# Patient Record
Sex: Female | Born: 1940 | Race: Black or African American | Hispanic: No | Marital: Married | Smoking: Former smoker
Health system: Southern US, Community
[De-identification: ages and names within clinical notes are randomized; demographics above are authoritative.]

## PROBLEM LIST (undated history)

## (undated) DIAGNOSIS — I2510 Atherosclerotic heart disease of native coronary artery without angina pectoris: Secondary | ICD-10-CM

## (undated) DIAGNOSIS — N183 Chronic kidney disease, stage 3 unspecified: Secondary | ICD-10-CM

## (undated) DIAGNOSIS — D509 Iron deficiency anemia, unspecified: Secondary | ICD-10-CM

## (undated) DIAGNOSIS — E785 Hyperlipidemia, unspecified: Secondary | ICD-10-CM

## (undated) DIAGNOSIS — E669 Obesity, unspecified: Secondary | ICD-10-CM

## (undated) DIAGNOSIS — I4891 Unspecified atrial fibrillation: Secondary | ICD-10-CM

## (undated) DIAGNOSIS — I779 Disorder of arteries and arterioles, unspecified: Secondary | ICD-10-CM

## (undated) DIAGNOSIS — Z5189 Encounter for other specified aftercare: Secondary | ICD-10-CM

## (undated) DIAGNOSIS — I10 Essential (primary) hypertension: Secondary | ICD-10-CM

## (undated) DIAGNOSIS — E039 Hypothyroidism, unspecified: Secondary | ICD-10-CM

## (undated) DIAGNOSIS — I739 Peripheral vascular disease, unspecified: Secondary | ICD-10-CM

## (undated) HISTORY — DX: Peripheral vascular disease, unspecified: I73.9

## (undated) HISTORY — DX: Obesity, unspecified: E66.9

## (undated) HISTORY — DX: Chronic kidney disease, stage 3 (moderate): N18.3

## (undated) HISTORY — DX: Atherosclerotic heart disease of native coronary artery without angina pectoris: I25.10

## (undated) HISTORY — DX: Hyperlipidemia, unspecified: E78.5

## (undated) HISTORY — DX: Hypothyroidism, unspecified: E03.9

## (undated) HISTORY — DX: Essential (primary) hypertension: I10

## (undated) HISTORY — DX: Chronic kidney disease, stage 3 unspecified: N18.30

## (undated) HISTORY — DX: Disorder of arteries and arterioles, unspecified: I77.9

---

## 2004-04-01 HISTORY — PX: CORONARY ARTERY BYPASS GRAFT: SHX141

## 2004-04-08 ENCOUNTER — Encounter: Payer: Self-pay | Admitting: Cardiology

## 2004-04-11 ENCOUNTER — Encounter: Payer: Self-pay | Admitting: Cardiology

## 2004-04-11 ENCOUNTER — Inpatient Hospital Stay (HOSPITAL_COMMUNITY): Admission: AD | Admit: 2004-04-11 | Discharge: 2004-04-15 | Payer: Self-pay | Admitting: Cardiology

## 2004-05-12 ENCOUNTER — Ambulatory Visit: Payer: Self-pay | Admitting: Cardiology

## 2004-05-29 ENCOUNTER — Ambulatory Visit: Payer: Self-pay | Admitting: Cardiology

## 2004-07-10 ENCOUNTER — Ambulatory Visit: Payer: Self-pay | Admitting: Cardiology

## 2004-07-24 ENCOUNTER — Encounter
Admission: RE | Admit: 2004-07-24 | Discharge: 2004-07-24 | Payer: Self-pay | Admitting: Thoracic Surgery (Cardiothoracic Vascular Surgery)

## 2005-07-09 ENCOUNTER — Ambulatory Visit: Payer: Self-pay | Admitting: Cardiology

## 2006-08-09 ENCOUNTER — Ambulatory Visit: Payer: Self-pay | Admitting: Cardiology

## 2006-09-26 ENCOUNTER — Ambulatory Visit: Payer: Self-pay | Admitting: Cardiology

## 2006-12-02 ENCOUNTER — Ambulatory Visit: Payer: Self-pay | Admitting: Cardiology

## 2007-07-03 ENCOUNTER — Encounter: Payer: Self-pay | Admitting: Family Medicine

## 2007-07-23 ENCOUNTER — Encounter: Payer: Self-pay | Admitting: Family Medicine

## 2007-09-25 ENCOUNTER — Ambulatory Visit: Payer: Self-pay | Admitting: Family Medicine

## 2007-10-07 ENCOUNTER — Ambulatory Visit: Payer: Self-pay | Admitting: Family Medicine

## 2007-10-07 DIAGNOSIS — E785 Hyperlipidemia, unspecified: Secondary | ICD-10-CM | POA: Insufficient documentation

## 2007-10-07 DIAGNOSIS — I10 Essential (primary) hypertension: Secondary | ICD-10-CM | POA: Insufficient documentation

## 2007-10-07 DIAGNOSIS — E669 Obesity, unspecified: Secondary | ICD-10-CM | POA: Insufficient documentation

## 2007-11-06 ENCOUNTER — Ambulatory Visit: Payer: Self-pay | Admitting: Family Medicine

## 2007-11-06 LAB — CONVERTED CEMR LAB
Free T4: 1.33 ng/dL (ref 0.89–1.80)
T3, Free: 2.5 pg/mL (ref 2.3–4.2)
TSH: 9.99 microintl units/mL — ABNORMAL HIGH (ref 0.350–5.50)

## 2007-11-11 ENCOUNTER — Ambulatory Visit (HOSPITAL_COMMUNITY): Admission: RE | Admit: 2007-11-11 | Discharge: 2007-11-11 | Payer: Self-pay | Admitting: Family Medicine

## 2007-12-11 ENCOUNTER — Ambulatory Visit: Payer: Self-pay | Admitting: Family Medicine

## 2007-12-12 ENCOUNTER — Encounter: Payer: Self-pay | Admitting: Family Medicine

## 2007-12-25 ENCOUNTER — Encounter (HOSPITAL_COMMUNITY): Admission: RE | Admit: 2007-12-25 | Discharge: 2008-01-24 | Payer: Self-pay | Admitting: Endocrinology

## 2008-01-15 ENCOUNTER — Ambulatory Visit: Payer: Self-pay | Admitting: Cardiology

## 2008-01-15 ENCOUNTER — Encounter: Payer: Self-pay | Admitting: Physician Assistant

## 2008-01-21 ENCOUNTER — Ambulatory Visit: Payer: Self-pay | Admitting: Cardiology

## 2008-01-21 ENCOUNTER — Encounter: Payer: Self-pay | Admitting: Physician Assistant

## 2008-01-29 ENCOUNTER — Encounter: Payer: Self-pay | Admitting: Family Medicine

## 2008-01-29 ENCOUNTER — Telehealth: Payer: Self-pay | Admitting: Family Medicine

## 2008-01-29 ENCOUNTER — Ambulatory Visit: Payer: Self-pay | Admitting: Family Medicine

## 2008-01-29 LAB — CONVERTED CEMR LAB
Glucose, Bld: 82 mg/dL
Hgb A1c MFr Bld: 6.5 %

## 2008-02-05 ENCOUNTER — Ambulatory Visit: Payer: Self-pay | Admitting: Cardiology

## 2008-04-01 ENCOUNTER — Ambulatory Visit: Payer: Self-pay | Admitting: Family Medicine

## 2008-05-18 ENCOUNTER — Encounter: Payer: Self-pay | Admitting: Family Medicine

## 2008-05-18 ENCOUNTER — Telehealth: Payer: Self-pay | Admitting: Family Medicine

## 2008-06-02 ENCOUNTER — Ambulatory Visit: Payer: Self-pay | Admitting: Family Medicine

## 2008-06-28 ENCOUNTER — Encounter: Payer: Self-pay | Admitting: Family Medicine

## 2008-07-02 HISTORY — PX: EYE SURGERY: SHX253

## 2008-07-19 ENCOUNTER — Encounter: Payer: Self-pay | Admitting: Family Medicine

## 2008-07-26 ENCOUNTER — Encounter: Payer: Self-pay | Admitting: Family Medicine

## 2008-07-27 ENCOUNTER — Encounter: Payer: Self-pay | Admitting: Family Medicine

## 2008-08-10 ENCOUNTER — Ambulatory Visit: Payer: Self-pay | Admitting: Family Medicine

## 2008-08-12 DIAGNOSIS — J309 Allergic rhinitis, unspecified: Secondary | ICD-10-CM | POA: Insufficient documentation

## 2008-10-06 ENCOUNTER — Telehealth: Payer: Self-pay | Admitting: Family Medicine

## 2008-10-22 ENCOUNTER — Ambulatory Visit (HOSPITAL_COMMUNITY): Admission: RE | Admit: 2008-10-22 | Discharge: 2008-10-22 | Payer: Self-pay | Admitting: Family Medicine

## 2008-11-02 ENCOUNTER — Telehealth: Payer: Self-pay | Admitting: Family Medicine

## 2008-11-23 ENCOUNTER — Telehealth: Payer: Self-pay | Admitting: Family Medicine

## 2008-11-23 ENCOUNTER — Ambulatory Visit: Payer: Self-pay | Admitting: Family Medicine

## 2008-11-28 DIAGNOSIS — R7302 Impaired glucose tolerance (oral): Secondary | ICD-10-CM | POA: Insufficient documentation

## 2008-11-30 ENCOUNTER — Telehealth: Payer: Self-pay | Admitting: Family Medicine

## 2008-11-30 DIAGNOSIS — I779 Disorder of arteries and arterioles, unspecified: Secondary | ICD-10-CM | POA: Insufficient documentation

## 2008-11-30 DIAGNOSIS — I739 Peripheral vascular disease, unspecified: Secondary | ICD-10-CM

## 2008-12-08 ENCOUNTER — Encounter: Payer: Self-pay | Admitting: Cardiology

## 2009-01-04 ENCOUNTER — Ambulatory Visit: Payer: Self-pay | Admitting: Family Medicine

## 2009-01-17 ENCOUNTER — Telehealth: Payer: Self-pay | Admitting: Family Medicine

## 2009-01-17 DIAGNOSIS — R5381 Other malaise: Secondary | ICD-10-CM | POA: Insufficient documentation

## 2009-01-17 DIAGNOSIS — R5383 Other fatigue: Secondary | ICD-10-CM

## 2009-01-18 ENCOUNTER — Encounter: Payer: Self-pay | Admitting: Family Medicine

## 2009-01-20 LAB — CONVERTED CEMR LAB
ALT: 18 units/L (ref 0–35)
AST: 20 units/L (ref 0–37)
Albumin: 4.4 g/dL (ref 3.5–5.2)
Alkaline Phosphatase: 59 units/L (ref 39–117)
BUN: 21 mg/dL (ref 6–23)
Bilirubin, Direct: 0.1 mg/dL (ref 0.0–0.3)
CO2: 22 meq/L (ref 19–32)
Calcium: 9.7 mg/dL (ref 8.4–10.5)
Chloride: 106 meq/L (ref 96–112)
Cholesterol: 133 mg/dL (ref 0–200)
Creatinine, Ser: 1.18 mg/dL (ref 0.40–1.20)
Glucose, Bld: 95 mg/dL (ref 70–99)
HDL: 43 mg/dL (ref >39)
Indirect Bilirubin: 0.3 mg/dL (ref 0.0–0.9)
LDL Cholesterol: 62 mg/dL (ref 0–99)
Potassium: 4.2 meq/L (ref 3.5–5.3)
Sodium: 141 meq/L (ref 135–145)
TSH: 5.064 microintl units/mL — ABNORMAL HIGH (ref 0.350–4.500)
Total Bilirubin: 0.4 mg/dL (ref 0.3–1.2)
Total CHOL/HDL Ratio: 3.1
Total Protein: 8 g/dL (ref 6.0–8.3)
Triglycerides: 139 mg/dL (ref <150)
VLDL: 28 mg/dL (ref 0–40)

## 2009-01-31 ENCOUNTER — Telehealth: Payer: Self-pay | Admitting: Family Medicine

## 2009-01-31 DIAGNOSIS — N259 Disorder resulting from impaired renal tubular function, unspecified: Secondary | ICD-10-CM | POA: Insufficient documentation

## 2009-01-31 DIAGNOSIS — E039 Hypothyroidism, unspecified: Secondary | ICD-10-CM | POA: Insufficient documentation

## 2009-01-31 DIAGNOSIS — Z951 Presence of aortocoronary bypass graft: Secondary | ICD-10-CM | POA: Insufficient documentation

## 2009-02-02 ENCOUNTER — Encounter: Payer: Self-pay | Admitting: Cardiology

## 2009-02-02 ENCOUNTER — Ambulatory Visit: Payer: Self-pay | Admitting: Cardiology

## 2009-02-03 ENCOUNTER — Telehealth: Payer: Self-pay | Admitting: Family Medicine

## 2009-03-02 ENCOUNTER — Ambulatory Visit: Payer: Self-pay | Admitting: Family Medicine

## 2009-03-02 LAB — CONVERTED CEMR LAB
Glucose, Bld: 102 mg/dL
Hgb A1c MFr Bld: 6.3 %

## 2009-03-03 ENCOUNTER — Encounter: Payer: Self-pay | Admitting: Cardiology

## 2009-03-03 ENCOUNTER — Encounter: Payer: Self-pay | Admitting: Family Medicine

## 2009-03-03 LAB — CONVERTED CEMR LAB
Creatinine, Urine: 101.5 mg/dL
Microalb Creat Ratio: 6.2 mg/g (ref 0.0–30.0)
Microalb, Ur: 0.63 mg/dL (ref 0.00–1.89)

## 2009-03-04 ENCOUNTER — Telehealth (INDEPENDENT_AMBULATORY_CARE_PROVIDER_SITE_OTHER): Payer: Self-pay | Admitting: *Deleted

## 2009-03-14 ENCOUNTER — Telehealth: Payer: Self-pay | Admitting: Family Medicine

## 2009-03-22 ENCOUNTER — Telehealth: Payer: Self-pay | Admitting: Family Medicine

## 2009-06-01 ENCOUNTER — Ambulatory Visit: Payer: Self-pay | Admitting: Family Medicine

## 2009-06-01 LAB — CONVERTED CEMR LAB
Glucose, Bld: 101 mg/dL
Hgb A1c MFr Bld: 6.2 %

## 2009-06-09 ENCOUNTER — Telehealth: Payer: Self-pay | Admitting: Family Medicine

## 2009-06-28 ENCOUNTER — Telehealth: Payer: Self-pay | Admitting: Family Medicine

## 2009-08-09 LAB — CONVERTED CEMR LAB
ALT: 17 units/L (ref 0–35)
AST: 21 units/L (ref 0–37)
Albumin: 4.2 g/dL (ref 3.5–5.2)
Alkaline Phosphatase: 64 units/L (ref 39–117)
BUN: 31 mg/dL — ABNORMAL HIGH (ref 6–23)
Basophils Absolute: 0 10*3/uL (ref 0.0–0.1)
Basophils Relative: 0 % (ref 0–1)
Bilirubin, Direct: 0.1 mg/dL (ref 0.0–0.3)
CO2: 25 meq/L (ref 19–32)
Calcium: 10.6 mg/dL — ABNORMAL HIGH (ref 8.4–10.5)
Chloride: 103 meq/L (ref 96–112)
Cholesterol: 130 mg/dL (ref 0–200)
Creatinine, Ser: 1.2 mg/dL (ref 0.40–1.20)
Eosinophils Absolute: 0.5 10*3/uL (ref 0.0–0.7)
Eosinophils Relative: 6 % — ABNORMAL HIGH (ref 0–5)
Glucose, Bld: 103 mg/dL — ABNORMAL HIGH (ref 70–99)
HCT: 35.9 % — ABNORMAL LOW (ref 36.0–46.0)
HDL: 45 mg/dL (ref >39)
Hemoglobin: 11.9 g/dL — ABNORMAL LOW (ref 12.0–15.0)
Indirect Bilirubin: 0.3 mg/dL (ref 0.0–0.9)
LDL Cholesterol: 55 mg/dL (ref 0–99)
Lymphocytes Relative: 43 % (ref 12–46)
Lymphs Abs: 3.3 10*3/uL (ref 0.7–4.0)
MCHC: 33.1 g/dL (ref 30.0–36.0)
MCV: 89.1 fL (ref 78.0–100.0)
Monocytes Absolute: 0.5 10*3/uL (ref 0.1–1.0)
Monocytes Relative: 7 % (ref 3–12)
Neutro Abs: 3.3 10*3/uL (ref 1.7–7.7)
Neutrophils Relative %: 43 % (ref 43–77)
Platelets: 194 10*3/uL (ref 150–400)
Potassium: 4.7 meq/L (ref 3.5–5.3)
RBC: 4.03 M/uL (ref 3.87–5.11)
RDW: 13.9 % (ref 11.5–15.5)
Sodium: 141 meq/L (ref 135–145)
TSH: 1.891 microintl units/mL (ref 0.350–4.500)
Total Bilirubin: 0.4 mg/dL (ref 0.3–1.2)
Total CHOL/HDL Ratio: 2.9
Total Protein: 7.9 g/dL (ref 6.0–8.3)
Triglycerides: 150 mg/dL — ABNORMAL HIGH (ref <150)
VLDL: 30 mg/dL (ref 0–40)
Vit D, 25-Hydroxy: 50 ng/mL (ref 30–89)
WBC: 7.6 10*3/uL (ref 4.0–10.5)

## 2009-09-21 ENCOUNTER — Ambulatory Visit: Payer: Self-pay | Admitting: Family Medicine

## 2009-09-21 ENCOUNTER — Other Ambulatory Visit: Admission: RE | Admit: 2009-09-21 | Discharge: 2009-09-21 | Payer: Self-pay | Admitting: Family Medicine

## 2009-09-21 LAB — CONVERTED CEMR LAB: Pap Smear: NEGATIVE

## 2009-10-24 ENCOUNTER — Ambulatory Visit (HOSPITAL_COMMUNITY): Admission: RE | Admit: 2009-10-24 | Discharge: 2009-10-24 | Payer: Self-pay | Admitting: Family Medicine

## 2009-11-08 ENCOUNTER — Encounter: Payer: Self-pay | Admitting: Family Medicine

## 2009-11-23 LAB — CONVERTED CEMR LAB
BUN: 27 mg/dL — ABNORMAL HIGH (ref 6–23)
CO2: 25 meq/L (ref 19–32)
Calcium: 10.5 mg/dL (ref 8.4–10.5)
Chloride: 106 meq/L (ref 96–112)
Creatinine, Ser: 1.13 mg/dL (ref 0.40–1.20)
Glucose, Bld: 94 mg/dL (ref 70–99)
Hgb A1c MFr Bld: 5.9 % — ABNORMAL HIGH (ref <5.7)
Potassium: 5.5 meq/L — ABNORMAL HIGH (ref 3.5–5.3)
Sodium: 142 meq/L (ref 135–145)

## 2009-11-30 ENCOUNTER — Telehealth: Payer: Self-pay | Admitting: Family Medicine

## 2009-12-27 ENCOUNTER — Ambulatory Visit: Payer: Self-pay | Admitting: Family Medicine

## 2010-03-21 ENCOUNTER — Encounter: Payer: Self-pay | Admitting: Cardiology

## 2010-03-22 ENCOUNTER — Ambulatory Visit: Payer: Self-pay | Admitting: Cardiology

## 2010-03-31 ENCOUNTER — Encounter: Payer: Self-pay | Admitting: Cardiology

## 2010-03-31 ENCOUNTER — Telehealth: Payer: Self-pay | Admitting: Family Medicine

## 2010-03-31 ENCOUNTER — Ambulatory Visit (HOSPITAL_COMMUNITY): Admission: RE | Admit: 2010-03-31 | Discharge: 2010-03-31 | Payer: Self-pay | Admitting: Cardiology

## 2010-03-31 LAB — CONVERTED CEMR LAB
ALT: 18 units/L (ref 0–35)
AST: 21 units/L (ref 0–37)
Albumin: 4.4 g/dL (ref 3.5–5.2)
Alkaline Phosphatase: 56 units/L (ref 39–117)
BUN: 23 mg/dL (ref 6–23)
Bilirubin, Direct: 0.1 mg/dL (ref 0.0–0.3)
CO2: 26 meq/L (ref 19–32)
Calcium: 9.6 mg/dL (ref 8.4–10.5)
Chloride: 106 meq/L (ref 96–112)
Cholesterol: 141 mg/dL (ref 0–200)
Creatinine, Ser: 1.11 mg/dL (ref 0.40–1.20)
Creatinine, Urine: 75 mg/dL
Glucose, Bld: 95 mg/dL (ref 70–99)
HDL: 42 mg/dL (ref >39)
Hgb A1c MFr Bld: 6.2 % — ABNORMAL HIGH (ref <5.7)
Indirect Bilirubin: 0.4 mg/dL (ref 0.0–0.9)
LDL Cholesterol: 72 mg/dL (ref 0–99)
Microalb Creat Ratio: 48.7 mg/g — ABNORMAL HIGH (ref 0.0–30.0)
Microalb, Ur: 3.65 mg/dL — ABNORMAL HIGH (ref 0.00–1.89)
Potassium: 5.3 meq/L (ref 3.5–5.3)
Sodium: 143 meq/L (ref 135–145)
TSH: 1.719 microintl units/mL (ref 0.350–4.500)
Total Bilirubin: 0.5 mg/dL (ref 0.3–1.2)
Total CHOL/HDL Ratio: 3.4
Total Protein: 7.7 g/dL (ref 6.0–8.3)
Triglycerides: 137 mg/dL (ref <150)
VLDL: 27 mg/dL (ref 0–40)

## 2010-04-07 ENCOUNTER — Telehealth: Payer: Self-pay | Admitting: Family Medicine

## 2010-04-27 ENCOUNTER — Ambulatory Visit: Payer: Self-pay | Admitting: Family Medicine

## 2010-04-27 LAB — HM DIABETES FOOT EXAM

## 2010-05-11 ENCOUNTER — Telehealth: Payer: Self-pay | Admitting: Family Medicine

## 2010-07-11 LAB — CONVERTED CEMR LAB
ALT: 21 units/L (ref 0–35)
AST: 21 units/L (ref 0–37)
Albumin: 4.6 g/dL (ref 3.5–5.2)
Alkaline Phosphatase: 62 units/L (ref 39–117)
BUN: 26 mg/dL — ABNORMAL HIGH (ref 6–23)
Bilirubin, Direct: 0.2 mg/dL (ref 0.0–0.3)
CO2: 27 meq/L (ref 19–32)
Calcium: 9.7 mg/dL (ref 8.4–10.5)
Chloride: 106 meq/L (ref 96–112)
Cholesterol: 156 mg/dL (ref 0–200)
Creatinine, Ser: 1.16 mg/dL (ref 0.40–1.20)
Glucose, Bld: 103 mg/dL — ABNORMAL HIGH (ref 70–99)
HDL: 46 mg/dL (ref >39)
Hgb A1c MFr Bld: 6.1 % — ABNORMAL HIGH (ref <5.7)
Indirect Bilirubin: 0.3 mg/dL (ref 0.0–0.9)
LDL Cholesterol: 84 mg/dL (ref 0–99)
Potassium: 4.2 meq/L (ref 3.5–5.3)
Sodium: 142 meq/L (ref 135–145)
Total Bilirubin: 0.5 mg/dL (ref 0.3–1.2)
Total CHOL/HDL Ratio: 3.4
Total Protein: 8.1 g/dL (ref 6.0–8.3)
Triglycerides: 132 mg/dL (ref <150)
VLDL: 26 mg/dL (ref 0–40)

## 2010-07-30 LAB — CONVERTED CEMR LAB
ALT: 23 units/L (ref 0–35)
AST: 23 units/L (ref 0–37)
Albumin: 4.5 g/dL (ref 3.5–5.2)
Alkaline Phosphatase: 70 units/L (ref 39–117)
BUN: 25 mg/dL — ABNORMAL HIGH (ref 6–23)
Bilirubin, Direct: 0.1 mg/dL (ref 0.0–0.3)
CO2: 23 meq/L (ref 19–32)
Calcium: 9.9 mg/dL (ref 8.4–10.5)
Chloride: 105 meq/L (ref 96–112)
Cholesterol: 146 mg/dL (ref 0–200)
Creatinine, Ser: 1.16 mg/dL (ref 0.40–1.20)
Glucose, Bld: 111 mg/dL — ABNORMAL HIGH (ref 70–99)
HDL: 48 mg/dL (ref >39)
Indirect Bilirubin: 0.4 mg/dL (ref 0.0–0.9)
LDL Cholesterol: 61 mg/dL (ref 0–99)
Potassium: 4.8 meq/L (ref 3.5–5.3)
Sodium: 143 meq/L (ref 135–145)
Total Bilirubin: 0.5 mg/dL (ref 0.3–1.2)
Total CHOL/HDL Ratio: 3
Total Protein: 8.4 g/dL — ABNORMAL HIGH (ref 6.0–8.3)
Triglycerides: 185 mg/dL — ABNORMAL HIGH (ref <150)
VLDL: 37 mg/dL (ref 0–40)

## 2010-08-01 NOTE — Progress Notes (Signed)
Summary: refill  Phone Note Call from Patient   Reason for Call: Insurance Question Summary of Call: needs a refill on clonidine [facility] [phone] Initial call taken by: Lenn Cal,  March 31, 2010 2:20 PM  Follow-up for Phone Call        The last time this med was filled was 2010. Is the patient supposed to be taking this med? Follow-up by: Kate Sable LPN,  September 30, 624THL 3:37 PM  Additional Follow-up for Phone Call Additional follow up Details #1::        contact pt and let her know of the concern, states she takes it occasionally, we need to know what she isactually doing Additional Follow-up by: Tula Nakayama MD,  April 03, 2010 12:10 PM    Additional Follow-up for Phone Call Additional follow up Details #2::    returned call, left message Follow-up by: Baldomero Lamy LPN,  October  3, 624THL 1:25 PM  Additional Follow-up for Phone Call Additional follow up Details #3:: Details for Additional Follow-up Action Taken: patient states she was taking one half but directions are one and half Additional Follow-up by: Baldomero Lamy LPN,  October  3, 624THL 2:18 PM  pls refill at the same dose one and a half. let her know that her sept visither bP was high at the card office, so she needs to take as directed and sched appt here in the next 4 weeks if she has no appt, pls refill 2 months max , thanks  patient aware Baldomero Lamy LPN  October  3, 624THL 2:35 PM

## 2010-08-01 NOTE — Progress Notes (Signed)
Summary: call back  Phone Note Call from Patient   Summary of Call: left message would like to speak to jamie Initial call taken by: Dierdre Harness,  April 07, 2010 10:26 AM  Follow-up for Phone Call        see previous msg Follow-up by: Kate Sable LPN,  October  7, 624THL 11:21 AM

## 2010-08-01 NOTE — Cardiovascular Report (Signed)
Summary: Cardiac Catheterization  Cardiac Catheterization   Imported By: Bartholomew Boards 03/21/2010 16:46:14  _____________________________________________________________________  External Attachment:    Type:   Image     Comment:   External Document

## 2010-08-01 NOTE — Letter (Signed)
Summary: misc.  misc.   Imported By: Eliezer Mccoy 12/19/2009 15:46:27  _____________________________________________________________________  External Attachment:    Type:   Image     Comment:   External Document

## 2010-08-01 NOTE — Assessment & Plan Note (Signed)
Summary: physical- room 3   Vital Signs:  Patient profile:   70 year old female Menstrual status:  postmenopausal Height:      61 inches Weight:      207.75 pounds BMI:     39.40 O2 Sat:      97 % on Room air Pulse rate:   45 / minute Resp:     16 per minute BP sitting:   160 / 70  (left arm)  Vitals Entered By: Baldomero Lamy LPN (March 23, 624THL 624THL AM)  Nutrition Counseling: Patient's BMI is greater than 25 and therefore counseled on weight management options.  Serial Vital Signs/Assessments:  Time      Position  BP       Pulse  Resp  Temp     By                     162/84                         Kennith Gain PA  CC: physical- room 3 Is Patient Diabetic? Yes Pain Assessment Patient in pain? no        Primary Provider:  Tula Nakayama, MD  CC:  physical- room 3.  History of Present Illness: Pt is here today for a physical. Her last pap/pelvic exam was 2 yrs ago. No complaints except bra strap sometimes hurts Lt shoulder.  Notices more when her bras seem to be getting older & less supportive.  See' cardiologist once a yr.  CAD & hx of CABG No chest pain, shortness of breath, or periph edema.  Hx of DM. Blood sugars at home fasting 90-110, evenings 120's avg. No hypoglycemia. Exam UTD.  Blood pressure at home last night 132/55     Current Medications (verified): 1)  Crestor 10 Mg  Tabs (Rosuvastatin Calcium) .... One Tab By Mouth Once Daily 2)  Allegra 180 Mg  Tabs (Fexofenadine Hcl) .... One Tab By Mouth Once Daily 3)  Fish Oil Double Strength 1200 Mg Caps (Omega-3 Fatty Acids) .... Take 1 Tablet By Mouth Two Times A Day 4)  Diovan Hct 320-25 Mg  Tabs (Valsartan-Hydrochlorothiazide) .... One Tab By Mouth Once Daily 5)  Aspirin 81 Mg  Tbec (Aspirin) .... One Tab By Mouth Once Daily 6)  Calcium Carbonate 600 Mg Tabs (Calcium Carbonate) .... Take 1 Tablet By Mouth Two Times A Day 7)  Multivitamin/iron   Tabs (Multiple Vitamins-Iron) .... One Tab By Mouth  Once Daily 8)  Clonidine Hcl 0.3 Mg  Tabs (Clonidine Hcl) .... One and Half Tabs By Mouth At Bedtime Occasionally 9)  Metformin Hcl 500 Mg Tabs (Metformin Hcl) .... Take 1 Tablet By Mouth Once A Day 10)  Xalatan 0.005 % Soln (Latanoprost) .... One Drop Each Eye At Bedtime 11)  Toprol Xl 100 Mg Xr24h-Tab (Metoprolol Succinate) .... Take 1 Tablet By Mouth Once A Day 12)  Norvasc 5 Mg Tabs (Amlodipine Besylate) .... Take 1 Tablet By Mouth Once A Day 13)  Dorzolamide-Timolol 2-0.5 % Soln (Dorzolamide-Timolol) .... One Drop in Each Eye Bid 14)  Vitamin D3 1000 Unit Caps (Cholecalciferol) .... Take 1 Tablet By Mouth Once A Day 15)  Oscal 500/200 D-3 500-200 Mg-Unit Tabs (Calcium-Vitamin D) .... Take 1 Tablet By Mouth Three Times A Day 16)  Synthroid 25 Mcg Tabs (Levothyroxine Sodium) .... One and A Half Tablets Daily 17)  Armour Thyroid 30 Mg Tabs (Thyroid) .... Two Tabs  By Mouth Mon-Fri One Tab By Mouth Sat and Sun  Allergies (verified): No Known Drug Allergies  Past History:  Past medical, surgical, family and social histories (including risk factors) reviewed for relevance to current acute and chronic problems.  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 Coronary artery bypass graft x4 (10/05)  Family History: Reviewed history from 10/07/2007 and no changes required. MOTHER DECEASED ANEURYSM/ SITE  UNKNOWN / HYPERTENSIVE / CORONARY ARTERY  DISEASE FATHER DECEASED OF CAD FOUR SISTERS LIVING HYPERTENSION AND DIABETES/ ONE HAS CORONARY ARTERY DISEASE ONE SON /  DIABETES  Social History: Reviewed history from 10/07/2007 and no changes required. Retired Married Former Smoker Alcohol  use-no Drug use-no  Review of Systems Eyes:  Denies blurring and double vision. ENT:  Denies earache, nasal congestion, and sore throat. CV:  Denies chest pain or discomfort and palpitations. Resp:  Denies cough and shortness of breath. GI:  Complains of indigestion; denies abdominal pain, bloody stools, change in bowel habits, constipation, dark tarry stools, nausea, and vomiting; indigestion with certain foods only. GU:  Denies abnormal vaginal bleeding, dysuria, incontinence, and urinary frequency. Psych:  Denies anxiety and depression. Allergy:  Complains of seasonal allergies and sneezing.  Physical Exam  General:  Well-developed,well-nourished,in no acute distress; alert,appropriate and cooperative throughout examination Head:  Normocephalic and atraumatic without obvious abnormalities. No apparent alopecia or balding. Ears:  External ear exam shows no significant lesions or deformities.  Otoscopic examination reveals clear canals, tympanic membranes are intact bilaterally without bulging, retraction, inflammation or discharge. Hearing is grossly normal bilaterally. Nose:  External nasal examination shows no deformity or inflammation. Nasal mucosa are pink and moist without lesions or exudates. Mouth:  Oral mucosa and oropharynx without lesions or exudates.  Teeth in good repair. Neck:  No deformities, masses, or tenderness noted.no thyromegaly and no carotid bruits.   Chest Wall:  sternal scar,no deformities and no mass.   Breasts:  No mass, nodules, thickening, tenderness, bulging, retraction, inflamation, nipple discharge or skin changes noted.   Lungs:  Normal respiratory effort, chest expands symmetrically. Lungs are clear to auscultation, no crackles or wheezes. Heart:  Normal rate and regular rhythm. S1 and S2 normal without gallop, murmur, click, rub or other extra sounds. Abdomen:  Bowel sounds positive,abdomen soft and non-tender without masses, organomegaly or hernias  noted. Rectal:  No external abnormalities noted. Normal sphincter tone. No rectal masses or tenderness. Genitalia:  Normal introitus for age, no external lesions, no vaginal discharge, mucosa pink and moist, no vaginal or cervical lesions, no vaginal atrophy, no friaility or hemorrhage, normal uterus size and position, no adnexal masses or tenderness Skin:  Intact without suspicious lesions or rashes Cervical Nodes:  No lymphadenopathy noted Axillary Nodes:  No palpable lymphadenopathy Psych:  Cognition and judgment appear intact. Alert and cooperative with normal attention span and concentration. No apparent delusions, illusions, hallucinations   Impression & Recommendations:  Problem # 1:  Preventive Health Care (ICD-V70.0)  Problem # 2:  DIABETES MELLITUS, TYPE II (ICD-250.00) Assessment: Unchanged  Her updated medication list for this problem includes:    Diovan Hct 320-25 Mg Tabs (Valsartan-hydrochlorothiazide) ..... One tab by mouth once daily    Aspirin 81 Mg Tbec (Aspirin) ..... One tab by mouth once daily    Metformin Hcl 500  Mg Tabs (Metformin hcl) .Marland Kitchen... Take 1 tablet by mouth once a day  Labs Reviewed: Creat: 1.20 (08/09/2009)    Reviewed HgBA1c results: 6.2 (06/01/2009)  6.3 (03/02/2009)  Orders: T-Basic Metabolic Panel ([id number]) T- Hemoglobin A1C [id number])  Problem # 3:  HYPERTENSION (ICD-401.9) Assessment: Deteriorated Pt states ABP are much lower at home. Will have her bring her home BP monitor with her to check accuracy.  Her updated medication list for this problem includes:    Diovan Hct 320-25 Mg Tabs (Valsartan-hydrochlorothiazide) ..... One tab by mouth once daily    Clonidine Hcl 0.3 Mg Tabs (Clonidine hcl) ..... One and half tabs by mouth at bedtime occasionally    Toprol Xl 100 Mg Xr24h-tab (Metoprolol succinate) .Marland Kitchen... Take 1 tablet by mouth once a day    Norvasc 5 Mg Tabs (Amlodipine besylate) .Marland Kitchen... Take 1 tablet by mouth once a day  BP today:  160/70 Prior BP: 140/70 (06/01/2009)  Labs Reviewed: K+: 4.7 (08/09/2009) Creat: : 1.20 (08/09/2009)   Chol: 130 (08/09/2009)   HDL: 45 (08/09/2009)   LDL: 55 (08/09/2009)   TG: 150 (08/09/2009)  Problem # 4:  HYPERLIPIDEMIA (ICD-272.4) Assessment: Improved  Her updated medication list for this problem includes:    Crestor 10 Mg Tabs (Rosuvastatin calcium) ..... One tab by mouth once daily  Labs Reviewed: SGOT: 21 (08/09/2009)   SGPT: 17 (08/09/2009)   HDL:45 (08/09/2009), 43 (01/18/2009)  LDL:55 (08/09/2009), 62 (01/18/2009)  Chol:130 (08/09/2009), 133 (01/18/2009)  Trig:150 (08/09/2009), 139 (01/18/2009)  Complete Medication List: 1)  Crestor 10 Mg Tabs (Rosuvastatin calcium) .... One tab by mouth once daily 2)  Allegra 180 Mg Tabs (Fexofenadine hcl) .... One tab by mouth once daily 3)  Fish Oil Double Strength 1200 Mg Caps (Omega-3 fatty acids) .... Take 1 tablet by mouth two times a day 4)  Diovan Hct 320-25 Mg Tabs (Valsartan-hydrochlorothiazide) .... One tab by mouth once daily 5)  Aspirin 81 Mg Tbec (Aspirin) .... One tab by mouth once daily 6)  Calcium Carbonate 600 Mg Tabs (Calcium carbonate) .... Take 1 tablet by mouth two times a day 7)  Multivitamin/iron Tabs (Multiple vitamins-iron) .... One tab by mouth once daily 8)  Clonidine Hcl 0.3 Mg Tabs (Clonidine hcl) .... One and half tabs by mouth at bedtime occasionally 9)  Metformin Hcl 500 Mg Tabs (Metformin hcl) .... Take 1 tablet by mouth once a day 10)  Xalatan 0.005 % Soln (Latanoprost) .... One drop each eye at bedtime 11)  Toprol Xl 100 Mg Xr24h-tab (Metoprolol succinate) .... Take 1 tablet by mouth once a day 12)  Norvasc 5 Mg Tabs (Amlodipine besylate) .... Take 1 tablet by mouth once a day 13)  Dorzolamide-timolol 2-0.5 % Soln (Dorzolamide-timolol) .... One drop in each eye bid 14)  Vitamin D3 1000 Unit Caps (Cholecalciferol) .... Take 1 tablet by mouth once a day 15)  Oscal 500/200 D-3 500-200 Mg-unit Tabs  (Calcium-vitamin d) .... Take 1 tablet by mouth three times a day 16)  Synthroid 25 Mcg Tabs (Levothyroxine sodium) .... One and a half tablets daily 17)  Armour Thyroid 30 Mg Tabs (Thyroid) .... Two tabs by mouth mon-fri one tab by mouth sat and sun  Other Orders: Mammogram (Screening) (Mammo) Hemoccult Guaiac-1 spec.(in office) [id number])  Patient Instructions: 1)  Please schedule a follow-up appointment in 3 months. 2)  It is important that you exercise regularly at least 20 minutes 5 times a week. If you develop chest pain, have severe difficulty breathing, or feel  very tired , stop exercising immediately and seek medical attention. 3)  You need to lose weight. Consider a lower calorie diet and regular exercise.  4)  BMP prior to visit 5)  Continue to monitor blood pressure at home.  Bring your blood pressure machine to your next appt to check the accuracy.

## 2010-08-01 NOTE — Letter (Signed)
Summary: phone notes  phone notes   Imported By: Eliezer Mccoy 12/19/2009 15:46:44  _____________________________________________________________________  External Attachment:    Type:   Image     Comment:   External Document

## 2010-08-01 NOTE — Letter (Signed)
Summary: history and physical  history and physical   Imported By: Eliezer Mccoy 12/19/2009 15:45:31  _____________________________________________________________________  External Attachment:    Type:   Image     Comment:   External Document

## 2010-08-01 NOTE — Letter (Signed)
Summary: demographic  demographic   Imported By: Eliezer Mccoy 12/19/2009 15:45:11  _____________________________________________________________________  External Attachment:    Type:   Image     Comment:   External Document

## 2010-08-01 NOTE — Miscellaneous (Signed)
  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    nuclear  12/2007..  (no echo data as of 03/21/2010) HYPERLIPIDEMIA (ICD-272.4) HYPERTENSION (ICD-401.9) GLAUCOMA  dx 2010, had laser to both eyes in 2010 Hypothyroidism Renal insufficiency..mild Carotid artery disease   doppler 11/2008.Marland KitchenMarland KitchenMarland Kitchen50% RICA  /   doppler  03/2010.Marland KitchenMarland KitchenMarland Kitchen60-79% RICA... less than [id number] LICA...PLAN FOLLOW UP DOPPLER 6 MONTHS    (03/31/2010 12:42) Added new observation of PRIMARY MD: Tula Nakayama, MD (03/31/2010 12:42)       Past History:  Past Medical History: OBESITY (ICD-278.00) CAD (ICD-414.00)  /   nuclear 12/2007..small lateral fixed defect....EF 70% CABG..2005..x4 EF  70%.....    nuclear  12/2007..  (no echo data as of 03/21/2010) HYPERLIPIDEMIA (ICD-272.4) HYPERTENSION (ICD-401.9) GLAUCOMA  dx 2010, had laser to both eyes in 2010 Hypothyroidism Renal insufficiency..mild Carotid artery disease   doppler 11/2008.Marland KitchenMarland KitchenMarland Kitchen50% RICA  /   doppler  03/2010.Marland KitchenMarland KitchenMarland Kitchen60-79% RICA... less than [id number] LICA...PLAN FOLLOW UP DOPPLER 6 MONTHS

## 2010-08-01 NOTE — Assessment & Plan Note (Signed)
Summary: office visit   Vital Signs:  Patient profile:   70 year old female Menstrual status:  postmenopausal Height:      61 inches Weight:      198.50 pounds BMI:     37.64 O2 Sat:      98 % on Room air Pulse rate:   55 / minute Pulse rhythm:   regular Resp:     16 per minute BP sitting:   170 / 82  (left arm)  Vitals Entered By: Louie Casa CMA (April 27, 2010 11:38 AM)  Nutrition Counseling: Patient's BMI is greater than 25 and therefore counseled on weight management options.  O2 Flow:  Room air CC: follow up   Primary Care Provider:  Tula Nakayama, MD  CC:  follow up.  History of Present Illness: Reports  that she has been  doing well. Denies recent fever or chills. Denies sinus pressure, nasal congestion , ear pain or sore throat. Denies chest congestion, or cough productive of sputum. Denies chest pain, palpitations, PND, orthopnea or leg swelling. Denies abdominal pain, nausea, vomitting, diarrhea or constipation. Denies change in bowel movements or bloody stool. Denies dysuria , frequency, incontinence or hesitancy. Denies  joint pain, swelling, or reduced mobility. Denies headaches, vertigo, seizures. Denies depression, anxiety or insomnia. Denies  rash, lesions, or itch.     Current Medications (verified): 1)  Crestor 10 Mg  Tabs (Rosuvastatin Calcium) .... One Tab By Mouth Once Daily 2)  Allegra 180 Mg  Tabs (Fexofenadine Hcl) .... One Tab By Mouth Once Daily 3)  Fish Oil Double Strength 1200 Mg Caps (Omega-3 Fatty Acids) .... Take 1 Tablet By Mouth Two Times A Day 4)  Diovan Hct 320-25 Mg  Tabs (Valsartan-Hydrochlorothiazide) .... One Tab By Mouth Once Daily 5)  Aspirin 81 Mg  Tbec (Aspirin) .... One Tab By Mouth Once Daily 6)  Multivitamin/iron   Tabs (Multiple Vitamins-Iron) .... One Tab By Mouth Once Daily 7)  Metformin Hcl 500 Mg Tabs (Metformin Hcl) .... Take 1 Tablet By Mouth Once A Day 8)  Xalatan 0.005 % Soln (Latanoprost) .... One Drop  Each Eye At Bedtime 9)  Toprol Xl 100 Mg Xr24h-Tab (Metoprolol Succinate) .... Take 1 Tablet By Mouth Once A Day 10)  Norvasc 5 Mg Tabs (Amlodipine Besylate) .... Take 1 Tablet By Mouth Once A Day 11)  Dorzolamide-Timolol 2-0.5 % Soln (Dorzolamide-Timolol) .... One Drop in Each Eye Two Times A Day. 12)  Vitamin D3 1000 Unit Caps (Cholecalciferol) .... Take 1 Tablet By Mouth Once A Day 13)  Armour Thyroid 30 Mg Tabs (Thyroid) .... Two Tabs By Mouth Mon-Fri One Tab By Mouth Sat and Sun 14)  Theratrum Complete 50 Plus .Marland Kitchen.. 1 Tab Daily  Allergies (verified): 1)  ! * Clonidine  Review of Systems      See HPI General:  Complains of fatigue. Eyes:  Denies blurring, discharge, eye pain, and halos. Endo:  Denies cold intolerance, excessive thirst, excessive urination, and heat intolerance. Heme:  Denies abnormal bruising and bleeding. Allergy:  Denies hives or rash and itching eyes.  Physical Exam  General:  Well-developed,well-nourished,in no acute distress; alert,appropriate and cooperative throughout examination HEENT: No facial asymmetry,  EOMI, No sinus tenderness, TM's Clear, oropharynx  pink and moist.   Chest: Clear to auscultation bilaterally.  CVS: S1, S2, No murmurs, No S3.   Abd: Soft, Nontender.  MS: Adequate ROM spine, hips, shoulders and knees.  Ext: No edema.   CNS: CN 2-12 intact, power  tone and sensation normal throughout.   Skin: Intact, no visible lesions or rashes.  Psych: Good eye contact, normal affect.  Memory intact, not anxious or depressed appearing.   Diabetes Management Exam:    Foot Exam (with socks and/or shoes not present):       Sensory-Monofilament:          Left foot: diminished          Right foot: normal       Inspection:          Left foot: normal          Right foot: normal       Nails:          Left foot: thickened          Right foot: too long   Impression & Recommendations:  Problem # 1:  HYPOTHYROIDISM (ICD-244.9) Assessment  Unchanged  Her updated medication list for this problem includes:    Armour Thyroid 30 Mg Tabs (Thyroid) .Marland Kitchen..Marland Kitchen Two tabs by mouth mon-fri one tab by mouth sat and sun  Labs Reviewed: TSH: 1.719 (03/31/2010)    HgBA1c: 6.2 (03/31/2010) Chol: 141 (03/31/2010)   HDL: 42 (03/31/2010)   LDL: 72 (03/31/2010)   TG: 137 (03/31/2010)  Problem # 2:  DIABETES MELLITUS, TYPE II (ICD-250.00) Assessment: Unchanged  Her updated medication list for this problem includes:    Diovan Hct 320-25 Mg Tabs (Valsartan-hydrochlorothiazide) ..... One tab by mouth once daily    Aspirin 81 Mg Tbec (Aspirin) ..... One tab by mouth once daily    Metformin Hcl 500 Mg Tabs (Metformin hcl) .Marland Kitchen... Take 1 tablet by mouth once a day  Orders: T- Hemoglobin A1C [id number])  Labs Reviewed: Creat: 1.11 (03/31/2010)    Reviewed HgBA1c results: 6.2 (03/31/2010)  5.9 (11/23/2009)  Problem # 3:  HYPERLIPIDEMIA (ICD-272.4) Assessment: Unchanged  Her updated medication list for this problem includes:    Crestor 10 Mg Tabs (Rosuvastatin calcium) ..... One tab by mouth once daily  Orders: Medicare Electronic Prescription [phone]) T-Lipid Profile [phone]) T-Hepatic Function [id number]) Low fat dietdiscussed and encouraged  Labs Reviewed: SGOT: 21 (03/31/2010)   SGPT: 18 (03/31/2010)   HDL:42 (03/31/2010), 45 (08/09/2009)  LDL:72 (03/31/2010), 55 (08/09/2009)  Chol:141 (03/31/2010), 130 (08/09/2009)  Trig:137 (03/31/2010), 150 (08/09/2009)  Problem # 4:  OBESITY (ICD-278.00) Assessment: Improved  Ht: 61 (04/27/2010)   Wt: 198.50 (04/27/2010)   BMI: 37.64 (04/27/2010) therapeutic lifestyle change discussed and encouraged  Problem # 5:  HYPERTENSION (ICD-401.9) Assessment: Unchanged  The following medications were removed from the medication list:    Clonidine Hcl 0.3 Mg Tabs (Clonidine hcl) ..... One and half tabs by mouth at bedtime occasionally    Toprol Xl 100 Mg Xr24h-tab (Metoprolol succinate) .Marland Kitchen...  Take 1 tablet by mouth once a day Her updated medication list for this problem includes:    Diovan Hct 320-25 Mg Tabs (Valsartan-hydrochlorothiazide) ..... One tab by mouth once daily    Norvasc 5 Mg Tabs (Amlodipine besylate) .Marland Kitchen... Take 1 tablet by mouth once a day    Toprol Xl 100 Mg Xr24h-tab (Metoprolol succinate) ..... One and a half tablets once daily  Orders: Medicare Electronic Prescription ([id number]) T-Basic Metabolic Panel ([id number])  BP today: 170/82 Prior BP: 183/82 (03/22/2010) Patient advised to follow low sodium diet rich in fruit and vegetables, and to commit to at least 30 minutes 5 days per week of regular exercise , to improve blood presure control.   Labs Reviewed: K+: 5.3 (03/31/2010) Creat: :  1.11 (03/31/2010)   Chol: 141 (03/31/2010)   HDL: 42 (03/31/2010)   LDL: 72 (03/31/2010)   TG: 137 (03/31/2010)  Complete Medication List: 1)  Crestor 10 Mg Tabs (Rosuvastatin calcium) .... One tab by mouth once daily 2)  Allegra 180 Mg Tabs (Fexofenadine hcl) .... One tab by mouth once daily 3)  Fish Oil Double Strength 1200 Mg Caps (Omega-3 fatty acids) .... Take 1 tablet by mouth two times a day 4)  Diovan Hct 320-25 Mg Tabs (Valsartan-hydrochlorothiazide) .... One tab by mouth once daily 5)  Aspirin 81 Mg Tbec (Aspirin) .... One tab by mouth once daily 6)  Multivitamin/iron Tabs (Multiple vitamins-iron) .... One tab by mouth once daily 7)  Metformin Hcl 500 Mg Tabs (Metformin hcl) .... Take 1 tablet by mouth once a day 8)  Xalatan 0.005 % Soln (Latanoprost) .... One drop each eye at bedtime 9)  Norvasc 5 Mg Tabs (Amlodipine besylate) .... Take 1 tablet by mouth once a day 10)  Dorzolamide-timolol 2-0.5 % Soln (Dorzolamide-timolol) .... One drop in each eye two times a day. 11)  Vitamin D3 1000 Unit Caps (Cholecalciferol) .... Take 1 tablet by mouth once a day 12)  Armour Thyroid 30 Mg Tabs (Thyroid) .... Two tabs by mouth mon-fri one tab by mouth sat and sun 13)   Theratrum Complete 50 Plus  .Marland Kitchen.. 1 tab daily 14)  Toprol Xl 100 Mg Xr24h-tab (Metoprolol succinate) .... One and a half tablets once daily  Patient Instructions: 1)  Please schedule a follow-up appointment in 4 months. 2)  BMP prior to visit, ICD-9: 3)  Hepatic Panel prior to visit, ICD-9: 4)  Lipid Panel prior to visit, ICD-9: 5)  TSH prior to visit, ICD-9: 6)  HbgA1C prior to visit, ICD-9: 7)  New dose of toprol is oNE and a hALF tablets once daily Prescriptions: ARMOUR THYROID 30 MG TABS (THYROID) two tabs by mouth mon-fri one tab by mouth sat and sun  #48 Each x 3   Entered by:   Baldomero Lamy LPN   Authorized by:   Tula Nakayama MD   Signed by:   Baldomero Lamy LPN on 624THL   Method used:   Electronically to        [facility]* (retail)       [place], [place]  [address]       Ph: [phone] or [phone]       Fax: [phone]   RxID:   [id number] NORVASC 5 MG TABS (AMLODIPINE BESYLATE) Take 1 tablet by mouth once a day  #90 x 1   Entered by:   Baldomero Lamy LPN   Authorized by:   Tula Nakayama MD   Signed by:   Baldomero Lamy LPN on 624THL   Method used:   Electronically to        [facility]* (retail)       [address]       [place], [place]  [address]       Ph: [phone] or [phone]       Fax: [phone]   RxID:   [id number] TOPROL XL 100 MG XR24H-TAB (METOPROLOL SUCCINATE) one and a half tablets once daily  #135 x 1   Entered and Authorized by:   Tula Nakayama MD   Signed by:   Tula Nakayama  MD on 04/27/2010   Method used:   Printed then faxed to ...       [facility]* (retail)       [address]       [place], [place]  [address]       Ph: [phone] or [phone]       Fax: [phone]   RxID:   [id number]    Orders Added: 1)  Est. Patient Level IV [id number] 2)   Medicare Electronic Prescription [id number])  T-Basic Metabolic Panel 0000000 4)  T-Lipid Profile [80061-22930] 5)  T-Hepatic Function [80076-22960] 6)  T- Hemoglobin A1C [[id number]]

## 2010-08-01 NOTE — Progress Notes (Signed)
Summary: results  Phone Note Call from Patient   Summary of Call: would like to get results of lab work [phone] Initial call taken by: Lenn Cal,  November 30, 2009 1:16 PM  Follow-up for Phone Call        patient aware labs are normal Follow-up by: Baldomero Lamy LPN,  June  1, 624THL [id number] PM

## 2010-08-01 NOTE — Progress Notes (Signed)
Summary: medicine  Phone Note Call from Patient   Summary of Call: medicine doc gave her is to strong. can't take it. please call her. [phone] Initial call taken by: Lenn Cal,  April 07, 2010 10:28 AM  Follow-up for Phone Call        cannot take the clonidine, took strong for her. Made her voice hoarse and made her stomach tight, constipated. After she stopped it, she felt better. Wants you to know she is not going to take it and I put it on her allergy list. States bp yesterday when Wille Glaser checked it was 114/42. Told her to come to the office for app but she didn't want to. Said she would keep an eye on it and call us back with results Follow-up by: Kate Sable LPN,  October  7, 624THL 11:19 AM  Additional Follow-up for Phone Call Additional follow up Details #1::        noted Additional Follow-up by: Tula Nakayama MD,  April 07, 2010 12:18 PM   New Allergies: ! * CLONIDINE New Allergies: ! * CLONIDINE

## 2010-08-01 NOTE — Progress Notes (Signed)
Summary: medicine  Phone Note Call from Patient   Summary of Call: needs her metformin send  to [facility] 90 day supply Initial call taken by: Dierdre Harness,  May 11, 2010 10:51 AM  Follow-up for Phone Call        sent , pls let pt know Follow-up by: Tula Nakayama MD,  May 12, 2010 5:14 AM  Additional Follow-up for Phone Call Additional follow up Details #1::        PATIENT AWARE Additional Follow-up by: Dierdre Harness,  May 12, 2010 8:23 AM    New/Updated Medications: METFORMIN HCL 500 MG TABS (METFORMIN HCL) Take 1 tablet by mouth once a day Prescriptions: METFORMIN HCL 500 MG TABS (METFORMIN HCL) Take 1 tablet by mouth once a day  #90 x 1   Entered and Authorized by:   Tula Nakayama MD   Signed by:   Tula Nakayama MD on 05/11/2010   Method used:   Electronically to        [facility]* (retail)       [address]       [place], [place]  [address]       Ph: [phone] or [phone]       Fax: [phone]   RxID:   [id number]

## 2010-08-01 NOTE — Assessment & Plan Note (Signed)
Summary: office visit   Vital Signs:  Patient profile:   70 year old female Menstrual status:  postmenopausal Height:      61 inches Weight:      201.75 pounds BMI:     38.26 O2 Sat:      99 % Pulse rate:   51 / minute Pulse rhythm:   regular Resp:     16 per minute BP sitting:   130 / 80  (right arm)  Vitals Entered By: Kate Sable LPN (June 28, 624THL [id number] AM)  Nutrition Counseling: Patient's BMI is greater than 25 and therefore counseled on weight management options. CC: Follow up chronic problems   Primary Care Provider:  Tula Nakayama, MD  CC:  Follow up chronic problems.  History of Present Illness: Reports  that she has been doing well. Denies recent fever or chills. Denies sinus pressure, nasal congestion , ear pain or sore throat. Denies chest congestion, or cough productive of sputum. Denies chest pain, palpitations, PND, orthopnea or leg swelling. Denies abdominal pain, nausea, vomitting, diarrhea or constipation. Denies change in bowel movements or bloody stool. Denies dysuria , frequency, incontinence or hesitancy. Denies  joint pain, swelling, or reduced mobility. Denies headaches, vertigo, seizures. Denies depression, anxiety or insomnia. Denies  rash, lesions, or itch. The pt has modidfied her diet and concentrated on increased physical activity, with continued success with weight loss. She tesrts her sugars regularly, and fasting sugars are seldom over 110     Current Medications (verified): 1)  Crestor 10 Mg  Tabs (Rosuvastatin Calcium) .... One Tab By Mouth Once Daily 2)  Allegra 180 Mg  Tabs (Fexofenadine Hcl) .... One Tab By Mouth Once Daily 3)  Fish Oil Double Strength 1200 Mg Caps (Omega-3 Fatty Acids) .... Take 1 Tablet By Mouth Two Times A Day 4)  Diovan Hct 320-25 Mg  Tabs (Valsartan-Hydrochlorothiazide) .... One Tab By Mouth Once Daily 5)  Aspirin 81 Mg  Tbec (Aspirin) .... One Tab By Mouth Once Daily 6)  Multivitamin/iron   Tabs  (Multiple Vitamins-Iron) .... One Tab By Mouth Once Daily 7)  Clonidine Hcl 0.3 Mg  Tabs (Clonidine Hcl) .... One and Half Tabs By Mouth At Bedtime Occasionally 8)  Metformin Hcl 500 Mg Tabs (Metformin Hcl) .... Take 1 Tablet By Mouth Once A Day 9)  Xalatan 0.005 % Soln (Latanoprost) .... One Drop Each Eye At Bedtime 10)  Toprol Xl 100 Mg Xr24h-Tab (Metoprolol Succinate) .... Take 1 Tablet By Mouth Once A Day 11)  Norvasc 5 Mg Tabs (Amlodipine Besylate) .... Take 1 Tablet By Mouth Once A Day 12)  Dorzolamide-Timolol 2-0.5 % Soln (Dorzolamide-Timolol) .... One Drop in Each Eye Bid 13)  Vitamin D3 1000 Unit Caps (Cholecalciferol) .... Take 1 Tablet By Mouth Once A Day 14)  Oscal 500/200 D-3 500-200 Mg-Unit Tabs (Calcium-Vitamin D) .... Take 1 Tablet By Mouth Three Times A Day 15)  Armour Thyroid 30 Mg Tabs (Thyroid) .... Two Tabs By Mouth Mon-Fri One Tab By Mouth Sat and Sun  Allergies (verified): No Known Drug Allergies  Review of Systems      See HPI General:  Complains of fatigue. Eyes:  Denies discharge, eye pain, and red eye. Endo:  Denies cold intolerance, excessive hunger, excessive thirst, excessive urination, heat intolerance, polyuria, and weight change. Heme:  Denies abnormal bruising and bleeding. Allergy:  Complains of seasonal allergies; denies hives or rash and itching eyes.  Physical Exam  General:  Well-developed,well-nourished,in no acute distress; alert,appropriate and  cooperative throughout examination HEENT: No facial asymmetry,  EOMI, No sinus tenderness, TM's Clear, oropharynx  pink and moist.   Chest: Clear to auscultation bilaterally.  CVS: S1, S2, No murmurs, No S3.   Abd: Soft, Nontender.  MS: Adequate ROM spine, hips, shoulders and knees.  Ext: No edema.   CNS: CN 2-12 intact, power tone and sensation normal throughout.   Skin: Intact, no visible lesions or rashes.  Psych: Good eye contact, normal affect.  Memory intact, not anxious or depressed  appearing.   Diabetes Management Exam:    Foot Exam (with socks and/or shoes not present):       Sensory-Monofilament:          Left foot: diminished          Right foot: diminished       Inspection:          Left foot: normal          Right foot: normal       Nails:          Left foot: normal          Right foot: normal   Impression & Recommendations:  Problem # 1:  HYPOTHYROIDISM (ICD-244.9) Assessment Comment Only  The following medications were removed from the medication list:    Synthroid 25 Mcg Tabs (Levothyroxine sodium) ..... One and a half tablets daily Her updated medication list for this problem includes:    Armour Thyroid 30 Mg Tabs (Thyroid) .Marland Kitchen..Marland Kitchen Two tabs by mouth mon-fri one tab by mouth sat and sun  Labs Reviewed: TSH: 1.891 (08/09/2009)   , needs rept TSH in 2 months, c/o cost of med, but intolerant of syhthroid HgBA1c: 5.9 (11/23/2009) Chol: 130 (08/09/2009)   HDL: 45 (08/09/2009)   LDL: 55 (08/09/2009)   TG: 150 (08/09/2009)  Problem # 2:  DIABETES MELLITUS, TYPE II (ICD-250.00) Assessment: Improved  Her updated medication list for this problem includes:    Diovan Hct 320-25 Mg Tabs (Valsartan-hydrochlorothiazide) ..... One tab by mouth once daily    Aspirin 81 Mg Tbec (Aspirin) ..... One tab by mouth once daily    Metformin Hcl 500 Mg Tabs (Metformin hcl) .Marland Kitchen... Take 1 tablet by mouth once a day  Orders: T- Hemoglobin A1C [id number]) T-Urine Microalbumin w/creat. ratio [id number])  Labs Reviewed: Creat: 1.13 (11/23/2009)    Reviewed HgBA1c results: 5.9 (11/23/2009)  6.2 (06/01/2009)  Problem # 3:  OBESITY (ICD-278.00) Assessment: Improved  Ht: 61 (12/27/2009)   Wt: 201.75 (12/27/2009)   BMI: 38.26 (12/27/2009)  Problem # 4:  HYPERTENSION (ICD-401.9) Assessment: Improved  Her updated medication list for this problem includes:    Diovan Hct 320-25 Mg Tabs (Valsartan-hydrochlorothiazide) ..... One tab by mouth once daily    Clonidine  Hcl 0.3 Mg Tabs (Clonidine hcl) ..... One and half tabs by mouth at bedtime occasionally    Toprol Xl 100 Mg Xr24h-tab (Metoprolol succinate) .Marland Kitchen... Take 1 tablet by mouth once a day    Norvasc 5 Mg Tabs (Amlodipine besylate) .Marland Kitchen... Take 1 tablet by mouth once a day  Orders: T-Basic Metabolic Panel (99991111)  BP today: 130/80 Prior BP: 160/70 (09/21/2009)  Labs Reviewed: K+: 5.5 (11/23/2009) Creat: : 1.13 (11/23/2009)   Chol: 130 (08/09/2009)   HDL: 45 (08/09/2009)   LDL: 55 (08/09/2009)   TG: 150 (08/09/2009)  Problem # 5:  HYPERLIPIDEMIA (ICD-272.4) Assessment: Comment Only  Her updated medication list for this problem includes:    Crestor 10 Mg Tabs (Rosuvastatin calcium) .Marland KitchenMarland KitchenMarland KitchenMarland Kitchen  One tab by mouth once daily  Orders: T-Hepatic Function [id number]) T-Lipid Profile [id number])  Labs Reviewed: SGOT: 21 (08/09/2009)   SGPT: 17 (08/09/2009)   HDL:45 (08/09/2009), 43 (01/18/2009)  LDL:55 (08/09/2009), 62 (01/18/2009)  Chol:130 (08/09/2009), 133 (01/18/2009)  Trig:150 (08/09/2009), 139 (01/18/2009)  Complete Medication List: 1)  Crestor 10 Mg Tabs (Rosuvastatin calcium) .... One tab by mouth once daily 2)  Allegra 180 Mg Tabs (Fexofenadine hcl) .... One tab by mouth once daily 3)  Fish Oil Double Strength 1200 Mg Caps (Omega-3 fatty acids) .... Take 1 tablet by mouth two times a day 4)  Diovan Hct 320-25 Mg Tabs (Valsartan-hydrochlorothiazide) .... One tab by mouth once daily 5)  Aspirin 81 Mg Tbec (Aspirin) .... One tab by mouth once daily 6)  Multivitamin/iron Tabs (Multiple vitamins-iron) .... One tab by mouth once daily 7)  Clonidine Hcl 0.3 Mg Tabs (Clonidine hcl) .... One and half tabs by mouth at bedtime occasionally 8)  Metformin Hcl 500 Mg Tabs (Metformin hcl) .... Take 1 tablet by mouth once a day 9)  Xalatan 0.005 % Soln (Latanoprost) .... One drop each eye at bedtime 10)  Toprol Xl 100 Mg Xr24h-tab (Metoprolol succinate) .... Take 1 tablet by mouth once a day 11)   Norvasc 5 Mg Tabs (Amlodipine besylate) .... Take 1 tablet by mouth once a day 12)  Dorzolamide-timolol 2-0.5 % Soln (Dorzolamide-timolol) .... One drop in each eye bid 13)  Vitamin D3 1000 Unit Caps (Cholecalciferol) .... Take 1 tablet by mouth once a day 14)  Oscal 500/200 D-3 500-200 Mg-unit Tabs (Calcium-vitamin d) .... Take 1 tablet by mouth three times a day 15)  Armour Thyroid 30 Mg Tabs (Thyroid) .... Two tabs by mouth mon-fri one tab by mouth sat and sun  Other Orders: T-TSH [id number])  Patient Instructions: 1)  Please schedule a follow-up appointment in 4 months. 2)  It is important that you exercise regularly at least 20 minutes 5 times a week. If you develop chest pain, have severe difficulty breathing, or feel very tired , stop exercising immediately and seek medical attention. 3)  You need to lose weight. Consider a lower calorie diet and regular exercise. Congrats on the 15 pound weight loss in thepast year. 4)  BMP prior to visit, ICD-9: 5)  Hepatic Panel prior to visit, ICD-9:   fasting sept 3 or after 6)  Lipid Panel prior to visit, ICD-9: 7)  HbgA1C prior to visit, ICD-9: 8)  Urine Microalbumin prior to visit, ICD-9: 9)  Labs were excellent , no med changes  Prescriptions: NORVASC 5 MG TABS (AMLODIPINE BESYLATE) Take 1 tablet by mouth once a day  #90 x 2   Entered by:   Kate Sable LPN   Authorized by:   Tula Nakayama MD   Signed by:   Kate Sable LPN on 075-GRM   Method used:   Electronically to        [facility]* (retail)       [address]       [place], [place]  [address]       Ph: [phone] or [phone]       Fax: [phone]   RxID:   [id number] TOPROL XL 100 MG XR24H-TAB (METOPROLOL SUCCINATE) Take 1 tablet by mouth once a day  #90 x 2   Entered by:   Kate Sable LPN   Authorized by:   Tula Nakayama MD   Signed  by:   Kate Sable LPN on 075-GRM   Method used:   Electronically to         [facility]* (retail)       [address]       [place], [place]  [address]       Ph: [phone] or [phone]       Fax: [phone]   RxID:   [id number] DIOVAN HCT 320-25 MG  TABS (VALSARTAN-HYDROCHLOROTHIAZIDE) one tab by mouth once daily  #90 Each x 2   Entered by:   Kate Sable LPN   Authorized by:   Tula Nakayama MD   Signed by:   Kate Sable LPN on 075-GRM   Method used:   Electronically to        [facility]* (retail)       [address]       [place], [place]  [address]       Ph: [phone] or [phone]       Fax: [phone]   RxID:   [id number]

## 2010-08-01 NOTE — Miscellaneous (Signed)
  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    nuclear  12/2007..  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    nuclear  12/2007..  (no echo data as of 03/21/2010) HYPERLIPIDEMIA (ICD-272.4) HYPERTENSION (ICD-401.9) GLAUCOMA  dx 2010, had laser to both eyes in 2010 Hypothyroidism Renal insufficiency..mild Carotid artery disease   doppler 11/2008.Marland KitchenMarland KitchenMarland Kitchen50% RICA

## 2010-08-01 NOTE — Letter (Signed)
Summary: consult  consult   Imported By: Eliezer Mccoy 12/19/2009 15:44:51  _____________________________________________________________________  External Attachment:    Type:   Image     Comment:   External Document

## 2010-08-01 NOTE — Assessment & Plan Note (Signed)
Summary: 1 YR FU PER AUG REMINDER-SRS   Visit Type:  Follow-up Primary Provider:  Tula Nakayama, MD  CC:  CAD.  History of Present Illness: Patient is seen for followup of coronary artery disease. She is doing well.  She does not have any chest pain.  I saw her last February 02, 2009.  She is followed closely with Dr. Moshe Cipro.  Her blood pressure has been somewhat better.  Preventive Screening-Counseling & Management  Alcohol-Tobacco     Smoking Status: quit     Year Quit: 2005  Current Medications (verified): 1)  Crestor 10 Mg  Tabs (Rosuvastatin Calcium) .... One Tab By Mouth Once Daily 2)  Allegra 180 Mg  Tabs (Fexofenadine Hcl) .... One Tab By Mouth Once Daily 3)  Fish Oil Double Strength 1200 Mg Caps (Omega-3 Fatty Acids) .... Take 1 Tablet By Mouth Two Times A Day 4)  Diovan Hct 320-25 Mg  Tabs (Valsartan-Hydrochlorothiazide) .... One Tab By Mouth Once Daily 5)  Aspirin 81 Mg  Tbec (Aspirin) .... One Tab By Mouth Once Daily 6)  Multivitamin/iron   Tabs (Multiple Vitamins-Iron) .... One Tab By Mouth Once Daily 7)  Clonidine Hcl 0.3 Mg  Tabs (Clonidine Hcl) .... One and Half Tabs By Mouth At Bedtime Occasionally 8)  Metformin Hcl 500 Mg Tabs (Metformin Hcl) .... Take 1 Tablet By Mouth Once A Day 9)  Xalatan 0.005 % Soln (Latanoprost) .... One Drop Each Eye At Bedtime 10)  Toprol Xl 100 Mg Xr24h-Tab (Metoprolol Succinate) .... Take 1 Tablet By Mouth Once A Day 11)  Norvasc 5 Mg Tabs (Amlodipine Besylate) .... Take 1 Tablet By Mouth Once A Day 12)  Dorzolamide-Timolol 2-0.5 % Soln (Dorzolamide-Timolol) .... One Drop in Each Eye Bid 13)  Vitamin D3 1000 Unit Caps (Cholecalciferol) .... Take 1 Tablet By Mouth Once A Day 14)  Oscal 500/200 D-3 500-200 Mg-Unit Tabs (Calcium-Vitamin D) .... Take 1 Tablet By Mouth Three Times A Day 15)  Armour Thyroid 30 Mg Tabs (Thyroid) .... Two Tabs By Mouth Mon-Fri One Tab By Mouth Sat and Sun  Allergies (verified): No Known Drug  Allergies  Comments:  Nurse/Medical Assistant: The patient's medication bottles and allergies were reviewed with the patient and were updated in the Medication and Allergy Lists.  Past History:  Past Medical History: OBESITY (ICD-278.00) CAD (ICD-414.00)  /   nuclear 12/2007..small lateral fixed defect....EF 70% CABG..2005..x4 EF  70%.....    nuclear  12/2007..  (no echo data as of 03/21/2010) HYPERLIPIDEMIA (ICD-272.4) HYPERTENSION (ICD-401.9) GLAUCOMA  dx 2010, had laser to both eyes in 2010 Hypothyroidism Renal insufficiency..mild Carotid artery disease   doppler 11/2008.Marland KitchenMarland KitchenMarland Kitchen50% RICA  Social History: Smoking Status:  quit  Review of Systems       Patient denies fever, chills, headache, sweats, rash, change in vision, change in hearing, chest pain, cough, nausea vomiting, urinary symptoms.  He does have dry mouth from her clonidine.  All other systems are reviewed and are negative.  Vital Signs:  Patient profile:   70 year old female Menstrual status:  postmenopausal Height:      61 inches Weight:      200 pounds Pulse rate:   52 / minute BP sitting:   183 / 82  (left arm) Cuff size:   large  Vitals Entered By: Georgina Peer (March 22, 2010 2:11 PM)  Serial Vital Signs/Assessments:  Time      Position  BP       Pulse  Resp  Temp  By 2:16 PM             180/85   [place]   Physical Exam  General:  patient is stable today.  She is overweight. Head:  head is atraumatic. Eyes:  no xanthelasma. Neck:  no jugular venous distention. Chest Wall:  no chest wall tenderness.  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  Respiratory effort is nonlabored. Heart:  cardiac exam reveals S1-S2.  No clicks or significant murmurs. Abdomen:  abdomen is soft. Msk:  no musculoskeletal deformities. Extremities:  no peripheral edema. Skin:  skin rashes. Psych:  patient is oriented to person time and place.  Affect is  normal.   Impression & Recommendations:  Problem # 1:  HYPOTHYROIDISM (ICD-244.9)  Her updated medication list for this problem includes:    Armour Thyroid 30 Mg Tabs (Thyroid) .Marland Kitchen..Marland Kitchen Two tabs by mouth mon-fri one tab by mouth sat and sun The patient's thyroid is treated.  No change in therapy.  Problem # 2:  CAROTID ARTERY DISEASE (ICD-433.10) Patient has known carotid artery disease.  Her last Doppler was done June, 2010.  At that time she had 50% RICA stenosis.  She needs a followup Doppler study and this will be scheduled.  Problem # 3:  CAD (ICD-414.00)  Her updated medication list for this problem includes:    Aspirin 81 Mg Tbec (Aspirin) ..... One tab by mouth once daily    Toprol Xl 100 Mg Xr24h-tab (Metoprolol succinate) .Marland Kitchen... Take 1 tablet by mouth once a day    Norvasc 5 Mg Tabs (Amlodipine besylate) .Marland Kitchen... Take 1 tablet by mouth once a day Coronary disease is stable.  EKG is done and reviewed by me today.  There is sinus bradycardia.  She has diffuse T-wave changes that are old.  These are compatible with her left ventricular hypertrophy and hypertensive disease.  No further workup needed.  Problem # 4:  HYPERLIPIDEMIA (B2193296.4)  Her updated medication list for this problem includes:    Crestor 10 Mg Tabs (Rosuvastatin calcium) ..... One tab by mouth once daily Lipids are treated.  No change in therapy.  Problem # 5:  HYPERTENSION (ICD-401.9)  Her updated medication list for this problem includes:    Diovan Hct 320-25 Mg Tabs (Valsartan-hydrochlorothiazide) ..... One tab by mouth once daily    Aspirin 81 Mg Tbec (Aspirin) ..... One tab by mouth once daily    Clonidine Hcl 0.3 Mg Tabs (Clonidine hcl) ..... One and half tabs by mouth at bedtime occasionally    Toprol Xl 100 Mg Xr24h-tab (Metoprolol succinate) .Marland Kitchen... Take 1 tablet by mouth once a day    Norvasc 5 Mg Tabs (Amlodipine besylate) .Marland Kitchen... Take 1 tablet by mouth once a day Blood pressure in our office today is  180/82.  Patient took her blood pressure home today.  In the left arm it was 130/57.  The right arm it was 140/54.  It seems that she does have some component of increased blood pressure in the doctor's office.  I chose not to change her meds today.  She will see Dr. Moshe Cipro soon.  Other Orders: EKG w/ Interpretation (93000) Carotid Duplex (Carotid Duplex)  Patient Instructions: 1)  Your physician wants you to follow-up in: 1 year. You will receive a reminder letter in the mail one-two months in advance. If you  don't receive a letter, please call our office to schedule the follow-up appointment. 2)  Your physician recommends that you continue on your current medications as directed. Please refer to the Current Medication list given to you today. 3)  Your physician has requested that you have a carotid duplex. This test is an ultrasound of the carotid arteries in your neck. It looks at blood flow through these arteries that supply the brain with blood. Allow one hour for this exam. There are no restrictions or special instructions. If the results of your test are normal or stable, you will receive a letter. If they are abnormal, the nurse will contact you by phone.

## 2010-08-01 NOTE — Letter (Signed)
Summary: progress notes  progress notes   Imported By: Eliezer Mccoy 12/19/2009 15:47:29  _____________________________________________________________________  External Attachment:    Type:   Image     Comment:   External Document

## 2010-08-01 NOTE — Progress Notes (Signed)
Summary: DIABETIC EYE EXAM  DIABETIC EYE EXAM   Imported By: Dierdre Harness 11/15/2009 13:56:20  _____________________________________________________________________  External Attachment:    Type:   Image     Comment:   External Document

## 2010-08-01 NOTE — Letter (Signed)
Summary: labs  labs   Imported By: Eliezer Mccoy 12/19/2009 15:46:06  _____________________________________________________________________  External Attachment:    Type:   Image     Comment:   External Document

## 2010-08-01 NOTE — Letter (Signed)
Summary: xray  xray   Imported By: Eliezer Mccoy 12/19/2009 15:47:44  _____________________________________________________________________  External Attachment:    Type:   Image     Comment:   External Document

## 2010-08-28 ENCOUNTER — Ambulatory Visit (INDEPENDENT_AMBULATORY_CARE_PROVIDER_SITE_OTHER): Payer: BC Managed Care – HMO | Admitting: Family Medicine

## 2010-08-28 ENCOUNTER — Encounter: Payer: Self-pay | Admitting: Family Medicine

## 2010-08-28 ENCOUNTER — Other Ambulatory Visit: Payer: Self-pay | Admitting: Family Medicine

## 2010-08-28 DIAGNOSIS — E785 Hyperlipidemia, unspecified: Secondary | ICD-10-CM

## 2010-08-28 DIAGNOSIS — E669 Obesity, unspecified: Secondary | ICD-10-CM

## 2010-08-28 DIAGNOSIS — Z139 Encounter for screening, unspecified: Secondary | ICD-10-CM

## 2010-08-28 DIAGNOSIS — I10 Essential (primary) hypertension: Secondary | ICD-10-CM

## 2010-08-28 DIAGNOSIS — E119 Type 2 diabetes mellitus without complications: Secondary | ICD-10-CM

## 2010-08-29 LAB — CONVERTED CEMR LAB
Lymphocytes Relative: 42 % (ref 12–46)
Lymphs Abs: 3.3 10*3/uL (ref 0.7–4.0)
Monocytes Relative: 8 % (ref 3–12)
Neutro Abs: 3.5 10*3/uL (ref 1.7–7.7)
Neutrophils Relative %: 44 % (ref 43–77)
Platelets: 195 10*3/uL (ref 150–400)
RBC: 4.01 M/uL (ref 3.87–5.11)
TSH: 3.987 microintl units/mL (ref 0.350–4.500)
WBC: 7.9 10*3/uL (ref 4.0–10.5)

## 2010-09-07 NOTE — Letter (Signed)
Summary: d/c medicine  d/c medicine   Imported By: Dierdre Harness 08/29/2010 13:48:22  _____________________________________________________________________  External Attachment:    Type:   Image     Comment:   External Document

## 2010-09-07 NOTE — Assessment & Plan Note (Signed)
Summary: follow up   Vital Signs:  Patient profile:   70 year old female Menstrual status:  postmenopausal Height:      61 inches Weight:      199.75 pounds BMI:     37.88 O2 Sat:      98 % Pulse rate:   50 / minute Pulse rhythm:   regular Resp:     16 per minute BP sitting:   190 / 84  (left arm) Cuff size:   large  Vitals Entered By: Kate Sable LPN (February 27, [id number] 9:53 AM)  Nutrition Counseling: Patient's BMI is greater than 25 and therefore counseled on weight management options. CC: Follow up chronic problems   Primary Care Provider:  Tula Nakayama, MD  CC:  Follow up chronic problems.  History of Present Illness: Reports  that she has been fairly well. Denies recent fever or chills. Denies sinus pressure, nasal congestion , ear pain or sore throat. Denies chest congestion, or cough productive of sputum. Denies chest pain, palpitations, PND, orthopnea or leg swelling. Denies abdominal pain, nausea, vomitting, diarrhea or constipation. Denies change in bowel movements or bloody stool. Denies dysuria , frequency, incontinence or hesenies  joint pain, swelling, or reduced mobility. Denies headaches, vertigo, seizures. Denies depression, anxiety or insomnia. Denies  rash, lesions, or itch.     Current Medications (verified): 1)  Crestor 10 Mg  Tabs (Rosuvastatin Calcium) .... One Tab By Mouth Once Daily 2)  Allegra 180 Mg  Tabs (Fexofenadine Hcl) .... One Tab By Mouth Once Daily 3)  Fish Oil Double Strength 1200 Mg Caps (Omega-3 Fatty Acids) .... Take 1 Tablet By Mouth Two Times A Day 4)  Diovan Hct 320-25 Mg  Tabs (Valsartan-Hydrochlorothiazide) .... One Tab By Mouth Once Daily 5)  Aspirin 81 Mg  Tbec (Aspirin) .... One Tab By Mouth Once Daily 6)  Multivitamin/iron   Tabs (Multiple Vitamins-Iron) .... One Tab By Mouth Once Daily 7)  Xalatan 0.005 % Soln (Latanoprost) .... One Drop Each Eye At Bedtime 8)  Norvasc 5 Mg Tabs (Amlodipine Besylate) .... Take 1  Tablet By Mouth Once A Day 9)  Dorzolamide-Timolol 2-0.5 % Soln (Dorzolamide-Timolol) .... One Drop in Each Eye Two Times A Day. 10)  Vitamin D3 1000 Unit Caps (Cholecalciferol) .... Take 1 Tablet By Mouth Once A Day 11)  Armour Thyroid 30 Mg Tabs (Thyroid) .... Two Tabs By Mouth Mon-Fri One Tab By Mouth Sat and Sun 12)  Theratrum Complete 50 Plus .Marland Kitchen.. 1 Tab Daily 13)  Metformin Hcl 500 Mg Tabs (Metformin Hcl) .... Take 1 Tablet By Mouth Once A Day 14)  Toprol Xl 200 Mg Xr24h-Tab (Metoprolol Succinate) .... Take 1 Tablet By Mouth Once A Day Dose Increase Effective 08/28/2010  Allergies (verified): 1)  ! * Clonidine  Review of Systems      See HPI General:  Complains of fatigue; denies chills and fever. Eyes:  Complains of vision loss-both eyes; denies discharge and double vision. MS:  Complains of joint pain and stiffness. Endo:  Denies excessive thirst and excessive urination; tests on avg 3 times per week range is generally 88 to 112. Heme:  Denies abnormal bruising, bleeding, and enlarge lymph nodes. Allergy:  Complains of seasonal allergies; denies hives or rash, itching eyes, and persistent infections.  Physical Exam  General:  Well-developed,well-nourished,in no acute distress; alert,appropriate and cooperative throughout examination HEENT: No facial asymmetry,  EOMI, No sinus tenderness, TM's Clear, oropharynx  pink and moist.   Chest: Clear to  auscultation bilaterally.  CVS: S1, S2, No murmurs, No S3.   Abd: Soft, Nontender.  [id number] though  Adequate ROM spine, hips, shoulders and knees.  Ext: No edema.   CNS: CN 2-12 intact, power tone and sensation normal throughout.   Skin: Intact, no visible lesions or rashes.  Psych: Good eye contact, normal affect.  Memory intact, not anxious or depressed appearing.   Diabetes Management Exam:    Foot Exam (with socks and/or shoes not present):       Sensory-Monofilament:          Left foot: diminished          Right foot:  diminished       Inspection:          Left foot: normal          Right foot: normal       Nails:          Left foot: thickened          Right foot: thickened   Impression & Recommendations:  Problem # 1:  HYPOTHYROIDISM (ICD-244.9) Assessment Comment Only  Her updated medication list for this problem includes:    Armour Thyroid 30 Mg Tabs (Thyroid) .Marland Kitchen..Marland Kitchen Two tabs by mouth mon-fri one tab by mouth sat and sun  Orders: T-TSH [id number])  Labs Reviewed: TSH: 1.719 (03/31/2010)    HgBA1c: 6.1 (07/11/2010) Chol: 156 (07/11/2010)   HDL: 46 (07/11/2010)   LDL: 84 (07/11/2010)   TG: 132 (07/11/2010)  Problem # 2:  DIABETES MELLITUS, TYPE II (ICD-250.00) Assessment: Improved  Her updated medication list for this problem includes:    Diovan Hct 320-25 Mg Tabs (Valsartan-hydrochlorothiazide) ..... One tab by mouth once daily    Aspirin 81 Mg Tbec (Aspirin) ..... One tab by mouth once daily    Metformin Hcl 500 Mg Tabs (Metformin hcl) .Marland Kitchen... Take 1 tablet by mouth once a day  Orders: T- Hemoglobin A1C [id number])  Labs Reviewed: Creat: 1.16 (07/11/2010)    Reviewed HgBA1c results: 6.1 (07/11/2010)  6.2 (03/31/2010)  Problem # 3:  OBESITY (ICD-278.00) Assessment: Unchanged  Ht: 61 (08/28/2010)   Wt: 199.75 (08/28/2010)   BMI: 37.88 (08/28/2010) therapeutic lifestyle change discussed and encouraged  Problem # 4:  HYPERLIPIDEMIA (ICD-272.4) Assessment: Unchanged  Her updated medication list for this problem includes:    Crestor 10 Mg Tabs (Rosuvastatin calcium) ..... One tab by mouth once daily  Orders: T-Hepatic Function [id number]) T-Lipid Profile [phone]) Low fat dietdiscussed and encouraged  Labs Reviewed: SGOT: 21 (07/11/2010)   SGPT: 21 (07/11/2010)   HDL:46 (07/11/2010), 42 (03/31/2010)  LDL:84 (07/11/2010), 72 (03/31/2010)  Chol:156 (07/11/2010), 141 (03/31/2010)  Trig:132 (07/11/2010), 137 (03/31/2010)  Problem # 5:  HYPERTENSION  (ICD-401.9) Assessment: Deteriorated  The following medications were removed from the medication list:    Toprol Xl 100 Mg Xr24h-tab (Metoprolol succinate) ..... One and a half tablets once daily Her updated medication list for this problem includes:    Diovan Hct 320-25 Mg Tabs (Valsartan-hydrochlorothiazide) ..... One tab by mouth once daily    Norvasc 5 Mg Tabs (Amlodipine besylate) .Marland Kitchen... Take 1 tablet by mouth once a day    Toprol Xl 200 Mg Xr24h-tab (Metoprolol succinate) .Marland Kitchen... Take 1 tablet by mouth once a day dose increase effective 08/28/2010  Orders: Medicare Electronic Prescription ([id number]) T-Basic Metabolic Panel ([id number])  BP today: 190/84 Prior BP: 170/82 (04/27/2010)  Labs Reviewed: K+: 4.2 (07/11/2010) Creat: : 1.16 (07/11/2010)   Chol: 156 (07/11/2010)   HDL: 46 (07/11/2010)  LDL: 84 (07/11/2010)   TG: 132 (07/11/2010)  Complete Medication List: 1)  Crestor 10 Mg Tabs (Rosuvastatin calcium) .... One tab by mouth once daily 2)  Allegra 180 Mg Tabs (Fexofenadine hcl) .... One tab by mouth once daily 3)  Fish Oil Double Strength 1200 Mg Caps (Omega-3 fatty acids) .... Take 1 tablet by mouth two times a day 4)  Diovan Hct 320-25 Mg Tabs (Valsartan-hydrochlorothiazide) .... One tab by mouth once daily 5)  Aspirin 81 Mg Tbec (Aspirin) .... One tab by mouth once daily 6)  Multivitamin/iron Tabs (Multiple vitamins-iron) .... One tab by mouth once daily 7)  Xalatan 0.005 % Soln (Latanoprost) .... One drop each eye at bedtime 8)  Norvasc 5 Mg Tabs (Amlodipine besylate) .... Take 1 tablet by mouth once a day 9)  Dorzolamide-timolol 2-0.5 % Soln (Dorzolamide-timolol) .... One drop in each eye two times a day. 10)  Vitamin D3 1000 Unit Caps (Cholecalciferol) .... Take 1 tablet by mouth once a day 11)  Armour Thyroid 30 Mg Tabs (Thyroid) .... Two tabs by mouth mon-fri one tab by mouth sat and sun 12)  Theratrum Complete 50 Plus  .Marland Kitchen.. 1 tab daily 13)  Metformin Hcl 500 Mg Tabs  (Metformin hcl) .... Take 1 tablet by mouth once a day 14)  Toprol Xl 200 Mg Xr24h-tab (Metoprolol succinate) .... Take 1 tablet by mouth once a day dose increase effective 08/28/2010  Other Orders: T-CBC w/Diff [id number]) Future Orders: Radiology Referral (Radiology) ... 08/29/2010  Patient Instructions: 1)  Please schedule a follow-up appointment in 4.5 months. 2)  It is important that you exercise regularly at least 20 minutes 5 times a week. If you develop chest pain, have severe difficulty breathing, or feel very tired , stop exercising immediately and seek medical attention. 3)  You need to lose weight. Consider a lower calorie diet and regular exercise.  4)  Your sugar and cholesterol are great, no med changes at this time 5)  We are scheduling your mamogram which is due inAPril 6)  Your toprol is to be increased to TWO 100mg  tabs daily from one and a half, and the new med is toprol 200mg  one daily 7)  Your BP is high 8)  Labs today, and fasting labs in 4.5 weeks Prescriptions: ARMOUR THYROID 30 MG TABS (THYROID) two tabs by mouth mon-fri one tab by mouth sat and sun  #48 Each x 1   Entered by:   Baldomero Lamy LPN   Authorized by:   Tula Nakayama MD   Signed by:   Baldomero Lamy LPN on 075-GRM   Method used:   Electronically to        [facility] (retail)       [place], [place]  [address]       Ph: [phone] or [phone]       Fax: [phone]   RxID:   [id number] NORVASC 5 MG TABS (AMLODIPINE BESYLATE) Take 1 tablet by mouth once a day  #90 x 1   Entered by:   Baldomero Lamy LPN   Authorized by:   Tula Nakayama MD   Signed by:   Baldomero Lamy LPN on 075-GRM   Method used:   Electronically to        [facility] (retail)       [address]  [place], [place]  [address]       Ph: [phone] or [phone]       Fax: [phone]   RxID:    [id number] DIOVAN HCT 320-25 MG  TABS (VALSARTAN-HYDROCHLOROTHIAZIDE) one tab by mouth once daily  #90 Each x 1   Entered by:   Baldomero Lamy LPN   Authorized by:   Tula Nakayama MD   Signed by:   Baldomero Lamy LPN on 075-GRM   Method used:   Electronically to        [facility] (retail)       [place], [place]  [address]       Ph: [phone] or [phone]       Fax: [phone]   RxID:   [id number] ALLEGRA 180 MG  TABS (FEXOFENADINE HCL) one tab by mouth once daily  #90 Each x 1   Entered by:   Baldomero Lamy LPN   Authorized by:   Tula Nakayama MD   Signed by:   Baldomero Lamy LPN on 075-GRM   Method used:   Electronically to        [facility] (retail)       [address]       [place], [place]  [address]       Ph: [phone] or [phone]       Fax: [phone]   RxID:   [id number] TOPROL XL 200 MG XR24H-TAB (METOPROLOL SUCCINATE) Take 1 tablet by mouth once a day dose increase effective 08/28/2010  #90 x 1   Entered and Authorized by:   Tula Nakayama MD   Signed by:   Tula Nakayama MD on 08/28/2010   Method used:   Printed then faxed to ...       [facility] (retail)       [address]       [place], [place]  [address]       Ph: [phone] or [phone]       Fax: [phone]   RxID:   [id number]    Orders Added: 1)  Est. Patient Level IV [id number] 2)  Medicare Electronic Prescription [id number] 3)  T-CBC w/Diff [id number] 4)  T-TSH [id number] 5)  T-Basic Metabolic Panel 0000000 6)  T-Hepatic Function [80076-22960] 7)  T-Lipid Profile [80061-22930] 8)  T- Hemoglobin A1C [83036-23375] 9)  T-TSH [id number] 10)  Radiology Referral [Radiology]

## 2010-09-12 ENCOUNTER — Telehealth: Payer: Self-pay | Admitting: Cardiology

## 2010-09-21 ENCOUNTER — Telehealth: Payer: Self-pay | Admitting: *Deleted

## 2010-09-21 DIAGNOSIS — I6529 Occlusion and stenosis of unspecified carotid artery: Secondary | ICD-10-CM

## 2010-09-21 NOTE — Telephone Encounter (Signed)
On 3/13 pt s/w Vicky to find out if she needed 6 mo f/u carotid dopplers ordered now. Vicky sent note to Dr. Ron Parker. He confirmed that pt did need carotid doppler done. Order will be sent to [facility] for scheduling.

## 2010-09-22 ENCOUNTER — Other Ambulatory Visit: Payer: Self-pay | Admitting: Family Medicine

## 2010-09-22 ENCOUNTER — Other Ambulatory Visit: Payer: Self-pay | Admitting: Cardiology

## 2010-09-22 ENCOUNTER — Encounter: Payer: Self-pay | Admitting: Cardiology

## 2010-09-22 DIAGNOSIS — Z139 Encounter for screening, unspecified: Secondary | ICD-10-CM

## 2010-09-22 DIAGNOSIS — I6529 Occlusion and stenosis of unspecified carotid artery: Secondary | ICD-10-CM

## 2010-09-22 NOTE — Telephone Encounter (Signed)
Addended by: Delfino Lovett on: 09/22/2010 11:59 AM   Modules accepted: Orders

## 2010-09-28 NOTE — Progress Notes (Signed)
Summary: PHONE: REQUESTING CARTOID DOPPLERS ORDERED  Phone Note Call from Patient   Caller: SELF Call For: DR.Chrystian Ressler Summary of Call: Mrs. Blaize received a reminder in the mail today to have a 6 month cartoid doppler. She is not due to see Dr. Ron Parker until September of 2012. Will verify with Dr. Ron Parker that this is correct. Will need to call Mrs. Royals @ [phone] and if it is time to order dopplers she prefers to have them done @ [facility]. Initial call taken by: Delfino Lovett,  September 12, 2010 3:39 PM  Follow-up for Phone Call        It is correct that patient should have follow-up 6 month doppler now. Lorenza Evangelist, MD, [facility]  September 15, 2010 12:08 PM     Appended Document: PHONE: REQUESTING CARTOID DOPPLERS ORDERED Yes do 6 month doppler  Appended Document: PHONE: REQUESTING CARTOID DOPPLERS ORDERED See Epic Note/Order

## 2010-09-28 NOTE — Miscellaneous (Signed)
Summary: Orders Update  Clinical Lists Changes  Orders: Added new Referral order of Carotid Duplex (Carotid Duplex) - Signed

## 2010-10-26 ENCOUNTER — Other Ambulatory Visit: Payer: Self-pay | Admitting: Family Medicine

## 2010-10-27 ENCOUNTER — Ambulatory Visit (HOSPITAL_COMMUNITY)
Admission: RE | Admit: 2010-10-27 | Discharge: 2010-10-27 | Disposition: A | Discharge status: Home / Self Care | Payer: Medicare Other | Source: Ambulatory Visit | Attending: Cardiology | Admitting: Cardiology

## 2010-10-27 ENCOUNTER — Ambulatory Visit (HOSPITAL_COMMUNITY)
Admission: RE | Admit: 2010-10-27 | Discharge: 2010-10-27 | Disposition: A | Discharge status: Home / Self Care | Payer: Medicare Other | Source: Ambulatory Visit | Attending: Family Medicine | Admitting: Family Medicine

## 2010-10-27 DIAGNOSIS — E119 Type 2 diabetes mellitus without complications: Secondary | ICD-10-CM | POA: Insufficient documentation

## 2010-10-27 DIAGNOSIS — I6529 Occlusion and stenosis of unspecified carotid artery: Secondary | ICD-10-CM | POA: Insufficient documentation

## 2010-10-27 DIAGNOSIS — Z1231 Encounter for screening mammogram for malignant neoplasm of breast: Secondary | ICD-10-CM | POA: Insufficient documentation

## 2010-10-27 DIAGNOSIS — I10 Essential (primary) hypertension: Secondary | ICD-10-CM | POA: Insufficient documentation

## 2010-10-30 ENCOUNTER — Other Ambulatory Visit: Payer: Self-pay

## 2010-10-30 MED ORDER — THYROID 30 MG PO TABS: 30.0000 mg | ORAL_TABLET | ORAL | Status: DC

## 2010-10-30 NOTE — Progress Notes (Signed)
Pt not available per the person answered the phone at her home number.  Aware to have pt call back for results

## 2010-11-03 ENCOUNTER — Encounter: Payer: Self-pay | Admitting: *Deleted

## 2010-11-14 NOTE — Assessment & Plan Note (Signed)
[facility] OFFICE NOTE   NAME:[name] [name], [name] [name]                      MRN:          [id number]  DATE:01/15/2008                            DOB:          December 27, 1940    CARDIOLOGIST:  Carlena Bjornstad, MD, [facility].   PRIMARY CARE PHYSICIAN:  Norwood Levo. Moshe Cipro, MD   REASON FOR VISIT:  One-year followup.   HISTORY OF PRESENT ILLNESS:  [name] [name] is a very pleasant 70 year old  female patient with a history of coronary artery disease, status post  CABG in 2005, who presents to the office today for followup.  Since last  being seen, the patient has been diagnosed with hypothyroidism.  She has  been seen by Dr. Ronnald Collum and placed on Armour Thyroid for replacement.  She has also had some problems with her blood pressure and her blood  pressure medications.  She developed significant edema with 10 mg of  Norvasc a day.  She was then placed on clonidine.  Her endocrinologist  asked her to come off this until she saw Dr. Ron Parker back in followup.  She  also had a recent colonoscopy with polypectomy with Dr. Collene Mares.  She was  taken off aspirin for 5 days.  She has not yet started back on her  aspirin therapy.  She denies any chest pain.  She denies shortness of  breath.  She denies syncope, near syncope, or palpitations.  She denies  orthopnea, PND, or pedal edema.   CURRENT MEDICATIONS:  1. Multivitamin daily.  2. Diovan 320/25 mg daily.  3. Fexofenadine 180 mg daily.  4. Calcium 500 mg plus vitamin D 3 times a day.  5. Fish oil 1 g 3 times a day.  6. Aspirin 81 mg daily - to be restarted in 2 days.  7. Metoprolol XL 50 mg daily.  8. Amlodipine 10 mg 1/2 tablet daily.  9. Crestor 10 mg daily.  10.Armour      Thyroid 30 mg daily.   ALLERGIES:  No known drug allergies.   PHYSICAL EXAMINATION:  GENERAL:  She is a well-nourished and well-  developed female in no distress.  VITAL SIGNS:  Blood pressure 174/78, pulse 61,  and weight 207.4 pounds.  HEENT:  Normal.  NECK:  Without JVD.  CARDIAC:  Normal S1 and S2.  Regular rate and rhythm.  LUNGS:  Clear to auscultation bilaterally.  ABDOMEN:  Soft and nontender.  EXTREMITIES:  Without edema.  NEUROLOGIC:  She is alert and oriented x3.  Cranial II -XII grossly  intact.  VASCULAR:  No carotid artery bruit noted on the left.  She does have a  faint questionable carotid bruit noted on the right.   Electrocardiogram reveals sinus rhythm with a heart rate of 63, normal  axis with T-wave inversions in 1 and aVL, which are old.  She does have  T-wave inversions in 2, 3, and aVF, which looks somewhat new since her  prior tracing.   IMPRESSION:  1. Coronary artery disease.      a.     Status post coronary artery bypass  graft in October 2005.      b.     Abnormal EKG as outlined above.  2. Good left ventricular function.  3. Hypertension.  4. Hypothyroidism - recent diagnosis.  5. History of left pleural effusion - resolved.  6. Dyslipidemia.  7. History of renal insufficiency.  8. Status post recent colonoscopy.  9. Questionable right carotid bruit.   PLAN:  1. [name] [name] returns for followup.  Overall, she is doing well.  She      denies any symptoms of angina.  It has been 4 years since her      bypass surgery and she does have somewhat new changes on her      electrocardiogram.  We will proceed with an adenosine Cardiolite to      rule out significant ischemia.  2. Her blood pressure has been difficult to control recently.      However, she does tell me that when she checks her blood pressure      at home, she gets numbers that are more normal (130-140 over 70s-      80s).  She did have a cuff that recently broke.  She is in need of      a new blood pressure cuff.  I have given her prescription for blood      pressure cuff.  I have asked that she keep a diary.  We will review      those readings in a few weeks and decide if she needs any further       therapy.  Of note, I have asked her to continue to stay off the      clonidine until we see her blood pressure readings in follow up.  3. She will have carotid Dopplers performed to rule out significant      carotid artery stenosis.  4. She will restart her aspirin as directed by Gastroenterology.  5. She will follow up in the next 3 months or sooner p.r.n.      Richardson Dopp, PA-C  Electronically Signed      Carlena Bjornstad, MD, [facility]  Electronically Signed   SW/MedQ  DD: 01/15/2008  DT: 01/16/2008  Job #: [id number]   cc:   Norwood Levo. Moshe Cipro, M.D.  Lenard Simmer, M.D.

## 2010-11-14 NOTE — Assessment & Plan Note (Signed)
[place] CARDIOLOGY OFFICE NOTE   NAME:[name] [name]                      MRN:          [id number]  DATE:02/05/2008                            DOB:          1941/03/10    [name] [name] is seen for cardiology followup.  I saw her last on January 15, 2008 with a very complete note.  She is doing well.  We decided to  proceed with Doppler exam and she has a 50% stenosis of her right  internal carotid.  This should be repeated in a year.  She had a  Cardiolite scan.  This was done with adenosine.  She has a small defect  in the lateral wall that is predominantly fixed.  There is question of  mild increase in the t.i.d. ratio.  Her wall motion was vigorous with an  ejection fraction of 75%.  This raises the question that there may be  some mild ischemia or mild injury, but it would be very mild.  She does  not have any signs of marked ischemia.   Today in the office, she is feeling well.  She is going about full  activities.  She has no chest pain, syncope, or presyncope.  She brings  me a copy of her blood pressures.  Clearly, her blood pressure is under  control with her cuff at home.  It is of interest that it was elevated  when she was in Dr. Griffin Dakin office.  Otherwise, it is been normal.  Today, it is relatively good.   PAST MEDICAL HISTORY:   ALLERGIES:  No known drug allergies.   MEDICATIONS:  1. Multivitamin.  2. Diovan 320/25.  3. Fexofenadine.  4. Calcium.  5. Fish oil.  6. Aspirin.  7. Metoprolol 50.  8. Crestor 10.  9. Armour Thyroid 30.  10.Amlodipine 5.  11.Clonidine 0.3 mg at night time.   OTHER MEDICAL PROBLEMS:  See the complete list on the note of January 15, 2008.   REVIEW OF SYSTEMS:  She is feeling well and her review of systems is  negative.   PHYSICAL EXAMINATION:  VITAL SIGNS:  Blood pressure is 147/69 with a  rate of 53.  Her weight is 211 pounds.  GENERAL:  The patient is oriented to  person, time, and place.  Affect is  normal.  HEENT:  No xanthelasma.  She has normal extraocular motion.  NECK:  There are no carotid bruits.  There is no jugular venous  distention.  LUNGS:  Clear.  Respiratory effort is not labored.  CARDIAC:  S1 with an S2.  There are no clicks or significant murmurs.  ABDOMEN:  Soft, but obese.  She has no peripheral edema.   PROBLEMS:  1. Coronary artery disease.  She is post coronary artery bypass graft      in 2005.  Her resting EKG reveals diffuse nonspecific ST-T wave      changes.  Her current Cardiolite scan raises the question of a      small defect, but it is low-risk.  No further workup is needed.  2. Good left ventricular function.  3. Hypertension, treated.  4. Hypothyroidism, treated.  5. History of a left pleural effusion that resolved in the past.  6. Dyslipidemia, treated.  7. History of renal in insufficiency, mild.  8. Soft right carotid bruit heard intermittently.  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  She will continue her current blood      pressure meds.  I will see her back in 1 year for cardiology      followup.     Carlena Bjornstad, MD, [facility]  Electronically Signed    JDK/MedQ  DD: 02/05/2008  DT: 02/06/2008  Job #: [id number]   cc:   Norwood Levo. Moshe Cipro, M.D.  Lenard Simmer, M.D.

## 2010-11-14 NOTE — Assessment & Plan Note (Signed)
[place] CARDIOLOGY OFFICE NOTE   NAME:[name], [name] EGUSQUIZA                      MRN:          [id number]  DATE:12/02/2006                            DOB:          [birth year]    [name] [name] is doing very well.  She had been seen last in March of 2008.  At that time, a decision was made to decrease her aspirin to 81.  Her  Lasix and potassium were stopped.  Renal function was checked, and it  was quite good.  Diovan was increased in that she is taking 2 of her  tablets of the Diovan 160/12.5.  She is doing well with this.  A PA and  lateral chest film showed that her pleural effusions were gone.  She is  on Crestor and had been taking Crestor.  She did develop a rash;  however, she remained on the Crestor, and her rash is resolving.  I  think it is not from the drug.  She thinks it may be from a different  soap that she used.  She is not having chest pain, and she is going  about full activities.   PAST MEDICAL HISTORY:   ALLERGIES:  No known drug allergies.   MEDICATIONS:  1. Diovan 320/25.  2. Toprol-XL 25.  3. Aspirin 81.  4. Crestor 10.   OTHER MEDICAL PROBLEMS:  See the list below.   REVIEW OF SYSTEMS:  She is feeling well, other than her resolving rash.  Otherwise, her review of systems is negative.   PHYSICAL EXAMINATION:  VITAL SIGNS:  Blood pressure today is 150/88 with  a pulse of 56.  Her weight is 210.2 pounds.  GENERAL:  The patient is oriented to person, time and place.  Affect is  normal.  HEENT:  No xanthelasma.  She has normal extraocular motion.  Her  conjunctivae are normal.  There are no carotid bruits.  There is no  jugular venous distention.  LUNGS:  Clear.  CARDIAC:  An S1 with an S2.  There are no clicks or significant murmurs.  ABDOMEN:  Obese but soft.  She has normal bowel sounds.  She has no  significant peripheral edema.   LABORATORY DATA:  No labs are done today.   PROBLEMS:  1.  Coronary artery disease post CABG in October of 2005.  2. Normal left ventricular function.  3. Hypertension, treated.  4. History of left pleural effusion, which is now completely resolved.  5. Dyslipidemia.  She will remain on Crestor.  6. Obesity.  7. History of some mild renal insufficiency.  When checked, her last      creatinine was 1.1.  8. History of some microscopic hematuria followed by Dr. Maryland Pink.   VOLUME STATUS:  Her volume status is controlled.   History of smoking in the past that is resolved.   The patient is doing very well.  I will see her back in 1 year.  She  asked if fish oil would be good, and I recommended this.  She also asked  if calcium with vitamin D would  be good for her, and I also agreed with  this.     [name] Bjornstad, MD, [facility]  Electronically Signed    JDK/MedQ  DD: 12/02/2006  DT: 12/02/2006  Job #: [id number]   cc:   [name] [name], M.D.  Dr. Maryland Pink

## 2010-11-17 NOTE — Assessment & Plan Note (Signed)
[place] CARDIOLOGY OFFICE NOTE   NAME:[name], [name] MCCURLEY                      MRN:          [id number]  DATE:09/26/2006                            DOB:          Sep 19, 1940    PRIMARY CARDIOLOGIST:  Dr. Cleatis [name].   REASON FOR VISIT:  Scheduled 1 month followup. Please refer to Dr.  Kae [name] clinic note of February 8th for full details.   At that time patient was taken off both Lasix and potassium, per  patient's request, and she was brought back for early followup for  continued close monitoring of her volume status. She also had a fasting  lipid profile drawn, notable for a total cholesterol 130, triglyceride  159, HDL 23, and LDL 75.   Clinically, patient continues to do extremely well from a cardiac  standpoint since undergoing multi vessel bypass grafting in 2005. She is  quite active and denies any exertional chest discomfort or any  significant exertional dyspnea for that matter. She denies any  orthopnea, PND, or development of lower extremity edema since being  taken off the Lasix.   Of note, patient continues to be plagued by persistent hematuria, or  rather spotting which she notes on her undergarments, with no obvious  gross hematuria. However, she reports having undergone extensive  evaluation by Dr. Maryland [name] earlier this year, with all studies showing  no evidence of any definite pathology. More over, she reports to me  today that she has also had persistent dysuria since October of last  year. She had been previously treated with antibiotics, but again  reports the she was never found to have any evidence of a urinary tract  infection.   CURRENT MEDICATIONS:  1. Full dose aspirin.  2. Toprol XL 25 daily.  3. Diovan 160/12.5 daily.  4. Allegra.  5. Lipitor 20 daily.   PHYSICAL EXAMINATION:  Blood pressure 157/77, pulse 55, regular, weight  210.8.  GENERAL: A 70 year old female, obese, sitting  upright in no distress.  HEENT: Normocephalic, atraumatic.  NECK: Palpable carotid pulse, without bruits; no JVD.  LUNGS: Clear to auscultation all fields.  HEART: Regular rate and rhythm (S1, S2) no murmurs, rubs, or gallops.  ABDOMEN: Protuberant, nontender.  EXTREMITIES: Palpable pulses with no pedal edema.  NEURO: No focal deficit.   IMPRESSION:  1. Coronary artery disease.      a.     4 vessel coronary artery bypass graft, October 2005: LIMA-       LAD; SVG-second obtuse marginal; SVG-third obtuse marginal; SVG-       PDA.      b.     Normal left ventricular function.  2. Hypertension.  3. History of left pleural effusion.  4. Dyslipidemia.  5. Obesity.  6. History of mild renal insufficiency.  7. Microscopic hematuria.      a.     Followed by Dr. Maryland [name].   PLAN:  1. Decrease aspirin to 81 mg daily.  2. Formally discontinue Lasix and supplemental potassium.  3. Up titrate Diovan to 320/25 mg daily for better blood pressure  control.  4. Check followup BMET in 1 week for monitoring of electrolytes and      renal function.  5. Schedule PA/lateral chest x-ray in follow up of previously      documented left pleural effusion.  6. Substitute Lipitor with Crestor 10 daily, given the patient's      recent lipid profile, for better management of low HDL level.      Patient will then need a follow up fasting lipid/liver profile in 3      months.  7. Schedule return clinic follow up with myself and Dr. Cleatis [name]      in 3 months.      Mannie Stabile, PA-C  Electronically Signed      Carlena Bjornstad, MD, [facility]  Electronically Signed   GS/MedQ  DD: 09/26/2006  DT: 09/26/2006  Job #: [id number]   cc:   [name] [name], M.D.

## 2010-11-17 NOTE — Discharge Summary (Signed)
NAMEMAHASIN, DOLNEY NO.:  [id number]   MEDICAL RECORD NO.:  [id number]          PATIENT TYPE:  INP   LOCATION:  2041                         FACILITY:  [facility]   PHYSICIAN:  Valentina Gu. Roxy Manns, M.D. DATE OF BIRTH:  03-10-1941   DATE OF ADMISSION:  04/11/2004  DATE OF DISCHARGE:                                 DISCHARGE SUMMARY   ANTICIPATED DATE OF DISCHARGE:  April 15, 2004.   ADMISSION DIAGNOSIS:  Unstable angina.   ADDITIONAL/DISCHARGE DIAGNOSES:  1.  Severe three-vessel coronary artery disease, status post coronary artery      bypass grafting x 4, completed on April 12, 2004.  2.  Hypertension.  3.  Obesity.  4.  History of tobacco abuse.   HOSPITAL MANAGEMENT/PROCEDURES:  1.  Cardiac catheterization completed on April 11, 2004.  This revealed      severe three-vessel coronary artery disease and a normal left      ventricular systolic function.  Please see the dictation for the cardiac      catheterization for further details.  2.  Coronary artery bypass grafting x 4 completed on April 12, 2004.      Grafts included the left internal mammary artery to the distal left      anterior descending coronary artery, saphenous vein graft to the second      circumflex marginal branch, sequential saphenous vein graft to third      circumflex marginal branch and saphenous vein graft to the posterior      descending coronary artery.  3.  Transfusion of packed red blood cells for postoperative anemia.  4.  Initiation of cardiac rehabilitation phase 1.   CONSULTATIONS:  1.  Cardiac rehabilitation.  2.  Case management.   HISTORY OF PRESENT ILLNESS:  Ms. Acey is a 70 year old, obese, African  American female who has no prior history of coronary artery disease.  The  patient does have risk factors notable for a history of hypertension,  longstanding tobacco abuse and a family history of coronary artery disease.  The patient was feeling quite well and in her  usual state of health until  about a month prior to presentation.  The patient admitted to intermittent  episodes of pain in her jaw and upper chest tightness.  The patient  ultimately presented to [facility] on April 08, 2004, with  chest tightness associated with pain in her jaw.  The patient was admitted  to the hospital and ruled out for acute myocardial infarction.  The patient  subsequently underwent a stress Cardiolite examination and this was  reportedly abnormal and associated with recurrence of symptoms of pain in  the jaw.  The patient was subsequently transferred to [facility]  for elective cardiac catheterization.   HOSPITAL COURSE:  Ms. Dix was admitted to [facility] from  [facility] on April 11, 2004.  The patient was taken to  the cardiac catheterization laboratory and underwent a right and left heart  catheterization.  This revealed severe three-vessel coronary artery disease  and a normal left ventricular ejection fraction.  With these findings, a  CVTS consultation was initiated for consideration of surgical  revascularization.  Dr. Roxy Manns of CVTS saw and examined the patient in  consultation on April 11, 2004, and felt the patient would benefit from  coronary artery bypass grafting.   The patient was then taken to the operating room on April 12, 2004, and  underwent coronary artery bypass grafting x 4 with grafts as described  above.  The greater saphenous vein was endoscopically harvested from the  left thigh.  Overall, the patient tolerated the procedure well and came off  cardiopulmonary bypass in normal sinus rhythm.  The patient did not require  any inotropic agents for pressure support.  The patient was transferred to  the surgical intensive care unit in critical, but stable condition.  Please  see the operative note for further details.   Postoperatively, the patient progressed through a routine hospital  course.  There were no postoperative complications.  The patient was deemed  appropriate for initiation of discharge planning on postoperative day #2 or  April 14, 2004.  At the time of initiation of discharge, the patient was  doing extremely well.  She was tolerating a regular diet.  She had resumed  normal bowel and bladder function.  She was ambulating greater than 500 feet  with assistance in the hallways with cardiac rehabilitation.  The patient's  pain was well controlled with oral medications.  Her incisions were healing  well without evidence of infection.  The patient's heart rate and rhythm  remained stable.   DISPOSITION:  We will continue as planned with discharge to home on April 15, 2004, pending a.m. rounds and no change in the patient's clinical  status.  The patient is being discharged to home in improved and stable  condition.   Laboratory values at the time of discharge are as follows:  BMET reads  sodium of 134, potassium of 3.7, chloride of 103, CO2 of 25, glucose of 106,  BUN of 13, creatinine of 1.1 and calcium of 8.1.  CBC reads WBC of 10.8,  hemoglobin of 9.4, hematocrit of 27.0 and platelet count of 137.   Two-view chest x-ray completed on April 14, 2004, reads as follows:  Increased left lower lobe opacity either representing atelectasis or  infiltrate and mild right base atelectasis.  There is no evidence of acute  pulmonary edema or pneumothorax.   DISCHARGE MEDICATIONS:  1.  Aspirin 325 mg daily.  2.  Toprol XL 25 mg daily.  3.  Zocor 40 mg daily.  4.  Diovan 80 mg daily.  5.  Lasix 40 mg twice daily for five more days.  6.  K-Dur 20 mEq twice daily for five days.  7.  Allegra 60 mg twice daily.  8.  Tylox one to two tablets every four to six hours as needed for pain.   The patient is instructed to hold her Lotrel and Norvasc until seen by Dr.  Ron Parker in clinic.  ACTIVITY:  The patient is to avoid driving.  She is to avoid heavy lifting  or  strenuous activity.  She should continue to walk daily.  She should  continue her breathing exercises for one more week.   DIET:  The patient is to follow a low-fat, low-salt diet.   WOUND CARE:  The patient may shower.  She should wash her incisions daily  with soap and water.  She should notify the CVTS office if she has any  redness, swelling  or drainage from her incision site or if she has a fever  of greater than 101.0 degrees.   FOLLOWUP APPOINTMENTS:  1.  The patient should see Dr. Ron Parker of [facility] in [place], [place], within two weeks of discharge.  The [place] office will call      the patient with this exact date and time.  2.  The patient is scheduled to see Dr. Roxy Manns on Monday, May 15, 2004,      at 11:30 a.m.  The patient is otherwise instructed to notify the CVTS      office if she has any questions or concerns in the meantime.       CAF/MEDQ  D:  04/14/2004  T:  04/14/2004  Job:  [id number]   cc:   Dola Argyle, M.D.   Duke Salvia, M.D.  [address]  [place] [address]  Fax: [phone]

## 2010-11-17 NOTE — Assessment & Plan Note (Signed)
[facility] CARDIOLOGY OFFICE NOTE   NAME:[name] [name], [name] [name]                      MRN:          [id number]  DATE:08/09/2006                            DOB:          31-May-1941    The patient is seen for cardiology followup.  I had seen her a year ago  and she is doing well.  She is post CABG in October of 2005.  She had  been volume overloaded.  She has been on a low dose of Lasix and  potassium and she would like to see if they could be stopped.  We can  give this a try and then I will see her back in several weeks.   The patient tells me that she has had some hematuria and that it has  been evaluated and being followed and that no significant abnormalities  are noted.  She still has some pain with urination from this.  She will  be seeing Dr. Feliciana Rossetti for followup soon.   PAST MEDICAL HISTORY:  Allergies:  No known drug allergies.  Medications:  Diovan/hydrochlorothiazide, aspirin, Toprol XL, Allegra,  Lipitor 20, and Lasix and potassium that are both being stopped as of  today and held.   Other medical problems:  See the list below.   REVIEW OF SYSTEMS:  Other than some pain with urination she is doing  well.  She is not having any chest pain.  Review of systems otherwise is  negative.   PHYSICAL EXAMINATION:  Weight is 208 pounds.  Blood pressure 135/80 with  a pulse of 58.  The patient is oriented to person, time, and place and  her affect is normal.  LUNGS:  Clear.  Respiratory effort is not labored.  HEENT:  Reveals no xanthelasmas.  She has normal extraocular motion.  NECK:  There are no carotid bruits.  There is no jugular venous  distention.  CARDIAC:  S1, S2.  There are no clicks or significant murmurs.  ABDOMEN:  Obese.  She has no peripheral edema.   EKG shows no significant change from the past.   PROBLEMS:  1. Hypertension, treated.  2. Obesity.  3. History of smoking in the past.  4. Coronary  disease post CABG in 2005.  5. Prior volume overload.  This is stable and we will see how she does      off Lasix and potassium.  I will see her back in followup.  6. History of a left effusion in the past.  At some point we may      consider a followup chest film if one has not been done recently.  7. Recent hematuria.  Please see the note above.  The patient is      stable and I will see her back in a few weeks.     Carlena Bjornstad, MD, [facility]  Electronically Signed    JDK/MedQ  DD: 08/09/2006  DT: 08/09/2006  Job #: [id number]   cc:   Duke Salvia, M.D.

## 2010-11-17 NOTE — Op Note (Signed)
NAMEANJELIQUE, LUNDY               ACCOUNT NO.:  [id number]   MEDICAL RECORD NO.:  [id number]          PATIENT TYPE:  INP   LOCATION:  2315                         FACILITY:  [facility]   PHYSICIAN:  Valentina Gu. Roxy Manns, M.D. DATE OF BIRTH:  [birth year]   DATE OF PROCEDURE:  DATE OF DISCHARGE:                                 OPERATIVE REPORT   PREOPERATIVE DIAGNOSIS:  Severe 3-vessel coronary artery disease with new  onset unstable angina.   POSTOPERATIVE DIAGNOSIS:  Severe 3-vessel coronary artery disease with new  onset unstable angina.   PROCEDURE:  Median sternotomy for coronary artery bypass grafting x4 (left  internal mammary artery to distal left anterior descending coronary artery,  saphenous vein graft to second circumflex marginal branch and sequential  saphenous vein graft to third circumflex marginal branch, saphenous vein  graft to posterior descending coronary artery,  endoscopic saphenous vein  harvest from left thigh).   SURGEON:  Valentina Gu. Roxy Manns, M.D.   ASSISTANT:  Hoyle Sauer A. Zigmund Gottron.   ANESTHESIA:  General.   BRIEF CLINICAL NOTE:  The patient is a 70 year old obese African-American  female with no previous history of coronary artery disease. Risk factors are  notable for history of hypertension, longstanding tobacco abuse, and family  history of coronary artery disease.  She presented with new onset symptoms  of angina and cardiac catheterization performed by Dr. Elta Guadeloupe Pulsipher  demonstrated severe 3-vessel coronary artery disease with normal left  ventricular function. A full consultation note has been dictated previously.   OPERATIVE CONSENT:  The patient and her family have been counseled at length  regarding the indications, risks and potential benefits of coronary artery  bypass grafting.  Alternative treatment strategies have been discussed. They  understand and accept all associated risks of surgery, and desired to  proceed.  All of their questions have  been addressed.   OPERATIVE FINDINGS:  The patient is moderate to severely obese.  There is  moderate left ventricular hypertrophy.  Left ventricular systolic function  is normal.  All of the epicardial coronary arteries are somewhat small but  otherwise good quality targets for bypass grafting.  The left internal  mammary artery and the subclavian vein conduit are all good quality.   DESCRIPTION OF PROCEDURE:  The patient was brought directly to the operating  room on the above-mentioned date and central monitoring was established by  the anesthesia service under the care and direction of Dr. Finis Bud.  Specifically, a Swan-Ganz catheter was placed through the right internal  jugular approach. A radial arterial line was placed.  Intravenous  antibiotics are administered.  Following induction of general endotracheal  anesthesia, a Foley catheter was placed.  The patient's chest, abdomen, both  groins and both lower extremities are prepared and draped in a sterile  manner.   Endoscopic vein harvest is performed through a small incision made just  below the left knee.  Initially, a surgical incision was made just above the  right knee, but the greater saphenous vein is not localized at this  location. A second incision was made just below the  right knee and  similarly, the saphenous vein is not localized.  Ultimately, the greater  saphenous vein is identified just below the left knee through a small  incision.  An endoscopic saphenous vein harvest technique is then performed  uneventfully.  Once the saphenous vein is removed, it is notably a good  quality conduit, although slightly thick-walled.  After the saphenous vein  is removed, all of the surgical incisions in both lower extremities are  closed in multiple layers with running, absorbable suture.   A median sternotomy incision is performed and the left internal mammary  artery was dissected from the chest wall, and prepared  for bypass grafting.  The left internal mammary artery is a good quality conduit. The patient was  heparinized systemically.   The pericardium is opened. The ascending aorta is normal in appearance.  There is moderate left ventricular hypertrophy.  The heart is otherwise  normal in appearance.  The ascending aorta and the right atrium are  cannulated for cardiopulmonary bypass.  Adequate heparinization is verified.   Cardiopulmonary bypass is begun and the surface of the heart is inspected.  Distal sites are selected for coronary bypass grafting.  Portions of  saphenous vein are trimmed to appropriate lengths. A temperature probe is  placed in the left ventricular septum, a cardioplegic catheter is placed in  the ascending aorta.   The patient is allowed to cool passively to 32 degrees systemic temperature.  The aortic cross-clamp was applied, and cardioplegia was delivered initially  in an antegrade fashion through the aortic root.  Iced saline slush is  applied for topical hypothermia. The initial cardioplegic arrest and  myocardial cooling are felt to be excellent.  Repeat doses of cardioplegia  are administered intermittently throughout the cross-clamp portion of the  operation, both through the aortic root and down the subsequently placed  vein grafts to maintain septal temperature to below 15 degrees Centigrade.   The following distal coronary anastomosis is performed:   1.  The posterior descending coronary artery is grafted with a saphenous      vein graft in an end-to-side fashion. This coronary artery is diffusely      diseased proximally, but good quality at the site of the distal      anastomosis.  It is slightly small caliber, measuring approximately 1.3      mm in diameter at the site of distal bypass.   1.  The second circumflex marginal branch is grafted with a saphenous vein      graft in a side-to-side fashion. This coronary measures 1.5 mm in     diameter at the  site of distal bypass, and is of fair to good quality.   1.  The third circumflex marginal branch is grafted using the sequential      saphenous vein graft off of the vein placed in the second circumflex      marginal branch.  This coronary measures 1.5 mm in diameter and is of      good quality at the site of distal bypass.   1.  The distal left anterior descending coronary artery is grafted with a  left internal mammary artery in an end-to-side fashion.  This vessel  measures 1.5 mm in diameter and is of good quality at the site of distal  bypass.   Both proximal saphenous vein anastomoses are performed directly to the  ascending aorta prior to removal of the aortic cross-clamp. The LV septal  temperature is noted  to increase rapidly following re-perfusion of the left  internal mammary artery.  All air is evacuated from the aortic root.  The  aortic cross-clamp is removed after a total cross-clamp time of 62 minutes.   The heart is defibrillated and subsequently normal sinus rhythm resumes.  All proximal and distal coronary anastomoses are inspected for hemostasis  and appropriate graft orientation.  Epicardial pacing wires are affixed to  the right ventricular outflow tract and to the right atrial appendage.  The  patient is rewarmed to 37 degrees Centigrade temperature.  The patient is  weaned from cardiopulmonary bypass without difficulty.  The patient's rhythm  at separation from bypass is normal sinus rhythm.  Atrial pacing is  employed.  Total cardiopulmonary bypass time for the operation is 79  minutes.  No inotropic support is required.   The venous and arterial cannula are removed uneventfully.  Protamine is  administered to reverse the anticoagulation.  The mediastinum and  the left chest are irrigated with saline solution containing vancomycin.  Meticulous surgical hemostasis is ascertained. The mediastinum and left  chest are drained with 3 chest tubes placed through  separate stab incisions  inferiorly. The median sternotomy is closed using double strength sternal  wires.  The soft tissues anterior to the sternum are closed in multiple  layers and skin is closed with subcuticular skin closure.   The patient tolerated the procedure well and is transported to the  surgical intensive care unit in stable condition. There are no  intraoperative complications. All sponge, instrument and needle counts were  verified correct at completion of the operation.  No blood products were  administered.       CHO/MEDQ  D:  04/12/2004  T:  04/12/2004  Job:  [id number]   cc:   Satira Sark, M.D. [facility]   Junious Silk, M.D. [facility]   Duke Salvia, M.D.  [address]  [place] [address]  Fax: [phone]   Dola Argyle, M.D.   [facility]

## 2010-11-17 NOTE — Consult Note (Signed)
[name], BASSI               ACCOUNT NO.:  [id number]   MEDICAL RECORD NO.:  [id number]          PATIENT TYPE:  INP   LOCATION:  [id number]                         FACILITY:  [facility]   PHYSICIAN:  Valentina Gu. Roxy Manns, M.D. DATE OF BIRTH:  09-22-1940   DATE OF CONSULTATION:  04/11/2004  DATE OF DISCHARGE:                                   CONSULTATION   REFERRING PHYSICIAN:  Junious Silk, M.D. [facility]   PRIMARY CARDIOLOGIST:  Satira Sark, M.D. [facility]   PRIMARY CARE PHYSICIAN:  Duke Salvia, M.D.   REASON FOR CONSULTATION:  Severe three vessel coronary artery disease with  new onset unstable angina.   HISTORY OF PRESENT ILLNESS:  The patient is a 70 year old obese African-  American female from [name], [place], with no previous history of  coronary artery disease.  She has risk factors notable for a history of  hypertension, longstanding tobacco use, and a family history of coronary  artery disease.  The patient states that she has been in her usual state of  health until three of four weeks ago, when she first began to develop  intermittent episodes of pain in her jaw and upper chest tightness.  These  symptoms have waxed and waned over the last several weeks, and for the most  part are characterized only by sharp or burning pain in her jaw.  At times  the symptoms were associated with activity or with eating meals, but the  frequency and severity of symptoms remained somewhat atypical.  They have  not been associated with shortness of breath, diaphoresis, nausea.  She  presented to [facility] on October 8 with chest tightness  associated with pain in her jaw.  She was admitted to the hospital and ruled  out for acute myocardial infarction by serial cardiac enzymes.  She  subsequently underwent a stress Cardiolite examination yesterday.  The  details of this examination are not currently available, but reportedly, the  pain was abnormal and associated with  recurrence of symptoms of pain in the  jaw.  The patient was subsequently transferred to [name] for  elective cardiac catheterization.  Catheterization was performed this  afternoon by Junious Silk, M.D. [name] and findings are notable for the  presence of severe three vessel coronary artery disease with preserved left  ventricular function.  Cardiac surgical consultation has been requested.   REVIEW OF SYSTEMS:  GENERAL:  The patient reports good appetite.  She has  not been gaining or losing weight.  She has otherwise felt well.  CARDIAC:  Notable for symptoms as described above.  The patient denies problems with  exertional shortness of breath, resting shortness of breath, PND, orthopnea,  lower extremity edema, palpitations, or syncope.  RESPIRATORY:  Negative.  The patient denies productive cough, hemoptysis, or wheezing.  GASTROINTESTINAL:  Negative.  The patient denies difficulty swallowing.  She  reports no problems with her bowels and she specifically denies  hematochezia, hematemesis, or melena.  MUSCULOSKELETAL:  Notable for mild  chronic pain in both hips.  This does not seem to  limit her too much.  She  otherwise denies problems with arthritis or arthralgias.  NEUROLOGY:  Negative.  The patient denies symptoms suggestive of previous TIA or stroke.  GENITOURINARY:  Negative.  HEENT:  Negative.  INFECTIOUS:  Negative.  HEMATOLOGIC:  Negative.   PAST MEDICAL HISTORY:  1.  Hypertension.  2.  Obesity.  3.  Longstanding tobacco use.   PAST SURGICAL HISTORY:  None.   FAMILY HISTORY:  The patient has had several family members with coronary  artery disease.   SOCIAL HISTORY:  The patient is married and lives with her husband in [place].  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  She  has a longstanding history of tobacco use, currently smokes close to one  pack of cigarettes per day.  She denies history of significant alcohol   consumption.   MEDICATIONS:  1.  Diovan 80 mg daily.  2.  Lotrel 5/10 one tablet daily.  3.  Claritin 10 mg daily.   ALLERGIES:  No known drug allergies.   PHYSICAL EXAMINATION:  GENERAL:  The patient is a well-appearing, obese,  African-American female who appears her stated age in no acute distress.  VITAL SIGNS:  She has been in normal sinus rhythm and admission blood  pressure was 153/72 this afternoon with heart rate 50.  She is 5 foot 1 inch  tall and weighs 201 pounds.  HEENT:  Grossly unrevealing.  She has poor dentition.  NECK:  Supple.  There is no cervical nor supraclavicular lymphadenopathy.  There is no jugular venous distention.  No carotid bruits are noted.  CHEST:  Auscultation of the chest reveals clear and symmetrical breath  sounds bilaterally.  No wheezes and no rhonchi are noted.  HEART:  Regular rate and rhythm.  No murmurs, rubs, or gallops are  appreciated.  ABDOMEN:  Obese, but soft and nontender.  Bowel sounds are present.  There  are no palpable masses.  EXTREMITIES:  Warm and well-perfused.  There is no lower extremity edema.  Distal pulses are not palpable in either lower leg at the ankle.  There is  no sign of significant venous insufficiency.  RECTAL:  GENITOURINARY:  Both deferred.  SKIN:  Clean and dry and healthy-appearing throughout.  NEUROLOGY:  Grossly nonfocal.   LABORATORY DATA:  Blood work obtained at [facility] is  notable for normal serum electrolytes and baseline serum creatinine of 0.8.  Baseline hemoglobin is 12.7 with hematocrit of 37% on October 8.  White  blood cell count was normal at 9300 and platelet count was normal.  Coagulation profile was unremarkable.   DIAGNOSTIC TESTS:  Results of exercise stress test performed yesterday at  [facility] are reviewed and notable for the fact that the patient exercised in stage II of the standard Bruce protocol.  She walked  approximately three minutes and  reached a peak heart rate of 148.  She  developed associated jaw pain with marked increased in ST segment depression  of 2 to 3 mm that recovered slowly during recovery.   Cardiac catheterization performed today by Dr. Vicenta Aly is reviewed.  This  demonstrates severe three vessel coronary artery disease with normal left  ventricular function.  Specifically, there is an 80% stenosis of the  proximal left anterior descending coronary artery.  There is 90% stenosis of  the mid left circumflex coronary artery prior to bifurcation of the large  first circumflex marginal branch.  There is  90% long segment stenosis of the  left circumflex coronary artery beyond the takeoff of the first circumflex  marginal, and before a second circumflex marginal branch.  There is 90%  proximal stenosis of the right coronary artery with 70% stenosis of the  distal right coronary artery at the crux of the heart and bifurcation.  There is right dominant coronary circulation.  Left ventricular function  appears normal with no wall motion abnormalities.   IMPRESSION:  Severe three vessel coronary artery disease with new onset  symptoms of unstable angina and preserved left ventricular function.  I  believe that the patient would best be treated by elective coronary artery  bypass grafting.   PLAN:  I have outlined the options at length with the patient and her  family.  Alternative treatment strategies have been discussed in detail.  They understand and accept all associated risks of surgery including, but  not limited to risk of death, stroke, myocardial infarction, congestive  heart failure, respiratory failure, pneumonia, bleeding requiring blood  transfusion, arrhythmia, infection, and recurrent coronary artery disease.  They desire to proceed with surgery as planned.  They also understand the  palliative nature of revascularization for coronary artery disease and the  mandatory nature of the fact that the  patient must find a way to quit  smoking.  They understand that she will need to be followed carefully by Dr.  Feliciana Rossetti and her other doctors indefinitely in the future.  All of their  questions have been addressed.       CHO/MEDQ  D:  04/11/2004  T:  04/11/2004  Job:  [id number]   cc:   Satira Sark, M.D. [facility]   Dola Argyle, M.D.   Junious Silk, M.D. [facility]   Duke Salvia, M.D.  [address]  [place] [address]  Fax: [facility] in [place]

## 2010-11-17 NOTE — Cardiovascular Report (Signed)
NAMEFAISA, ROBERDS NO.:  [id number]   MEDICAL RECORD NO.:  [id number]          PATIENT TYPE:  INP   LOCATION:  [id number]                         FACILITY:  [facility]   PHYSICIAN:  Junious Silk, M.D. LHCDATE OF BIRTH:  24-Apr-1941   DATE OF PROCEDURE:  04/11/2004  DATE OF DISCHARGE:                              CARDIAC CATHETERIZATION   PROCEDURE PERFORMED:  Left heart catheterization with coronary angiography  and left ventriculography.   INDICATION:  Ms. Sasso is a 70 year old woman who was admitted to [facility] with chest pain.  She had a markedly positive exercise treadmill  test.  She was thus referred for cardiac catheterization.   PROCEDURE:  A 6 French sheath was placed in the right femoral artery.  Coronary angiography was performed with standard Judkins 6 French catheters.  Left ventriculography was performed with an angled pigtail catheter.  Contrast was Omnipaque.  There were no complications.   RESULTS:  Hemodynamics:  Left ventricular pressure 156/12, aortic pressure  156/66.  There was no aortic valve gradient.   Left ventriculogram:  The left ventricle is hyperdynamic.  Ejection fraction  is estimated at greater than 60%.  There is no mitral regurgitation.   Coronary arteriography (right dominant).   Left main is normal.   Left anterior descending artery has a tubular 80% stenosis in the mid vessel  just after a large first diagonal branch.  In the distal LAD there is a 70%  stenosis.  The first diagonal itself has diffuse 70% stenosis proximally.   Left circumflex is a very tortuous artery.  There is a very long 80-90%  stenosis in the mid circumflex which extends across the origin of a large  second obtuse marginal branch.  The circumflex gives rise to a small first  obtuse marginal, large second obtuse marginal and a large third obtuse  marginal branch.  The second obtuse marginal has a 40% stenosis proximally.   Right coronary  artery has a 90% stenosis in the proximal vessel followed by  a tubular 60% stenosis in the mid vessel.  In the distal right coronary  artery there is a 60% stenosis which extends into the proximal portion of  the posterior descending artery.  The distal right coronary artery gives  rise to a normal size posterior descending artery and a small posterolateral  branch.   IMPRESSION:  1.  Normal left ventricular systolic function.  2.  Severe three-vessel coronary artery disease.   PLAN:  The patient will be referred for coronary artery bypass surgery.       MWP/MEDQ  D:  04/11/2004  T:  04/11/2004  Job:  [id number]   cc:   Feliciana Rossetti, MD  [facility]

## 2010-11-28 ENCOUNTER — Other Ambulatory Visit: Payer: Self-pay | Admitting: Family Medicine

## 2010-11-29 LAB — LIPID PANEL
HDL: 49 mg/dL (ref >39)
LDL Cholesterol: 74 mg/dL (ref 0–99)
Total CHOL/HDL Ratio: 2.9 Ratio
VLDL: 18 mg/dL (ref 0–40)

## 2010-11-29 LAB — HEPATIC FUNCTION PANEL
AST: 33 U/L (ref 0–37)
Albumin: 4.3 g/dL (ref 3.5–5.2)
Bilirubin, Direct: 0.1 mg/dL (ref 0.0–0.3)
Total Bilirubin: 0.4 mg/dL (ref 0.3–1.2)

## 2010-11-29 LAB — HEMOGLOBIN A1C: Hgb A1c MFr Bld: 6 % — ABNORMAL HIGH (ref <5.7)

## 2010-11-29 LAB — BASIC METABOLIC PANEL
CO2: 27 mEq/L (ref 19–32)
Chloride: 107 mEq/L (ref 96–112)
Glucose, Bld: 90 mg/dL (ref 70–99)
Potassium: 5.3 mEq/L (ref 3.5–5.3)
Sodium: 142 mEq/L (ref 135–145)

## 2010-11-29 LAB — TSH: TSH: 4.764 u[IU]/mL — ABNORMAL HIGH (ref 0.350–4.500)

## 2010-12-26 ENCOUNTER — Encounter: Payer: Self-pay | Admitting: Family Medicine

## 2010-12-27 ENCOUNTER — Encounter: Payer: Self-pay | Admitting: Family Medicine

## 2010-12-27 ENCOUNTER — Ambulatory Visit (INDEPENDENT_AMBULATORY_CARE_PROVIDER_SITE_OTHER): Payer: Medicare Other | Admitting: Family Medicine

## 2010-12-27 VITALS — BP 160/86 | HR 54 | Resp 16 | Ht 61.0 in | Wt 200.4 lb

## 2010-12-27 DIAGNOSIS — M62838 Other muscle spasm: Secondary | ICD-10-CM

## 2010-12-27 DIAGNOSIS — I10 Essential (primary) hypertension: Secondary | ICD-10-CM

## 2010-12-27 DIAGNOSIS — E785 Hyperlipidemia, unspecified: Secondary | ICD-10-CM

## 2010-12-27 DIAGNOSIS — E039 Hypothyroidism, unspecified: Secondary | ICD-10-CM

## 2010-12-27 DIAGNOSIS — E119 Type 2 diabetes mellitus without complications: Secondary | ICD-10-CM

## 2010-12-27 DIAGNOSIS — M542 Cervicalgia: Secondary | ICD-10-CM

## 2010-12-27 DIAGNOSIS — I2510 Atherosclerotic heart disease of native coronary artery without angina pectoris: Secondary | ICD-10-CM

## 2010-12-27 MED ORDER — CYCLOBENZAPRINE HCL 5 MG PO TABS: ORAL_TABLET | ORAL | Status: DC

## 2010-12-27 MED ORDER — VALSARTAN-HYDROCHLOROTHIAZIDE 320-25 MG PO TABS: 1.0000 | ORAL_TABLET | Freq: Every day | ORAL | Status: DC

## 2010-12-27 MED ORDER — AMLODIPINE BESYLATE 5 MG PO TABS: 5.0000 mg | ORAL_TABLET | Freq: Every day | ORAL | Status: DC

## 2010-12-27 MED ORDER — THYROID 30 MG PO TABS: ORAL_TABLET | ORAL | Status: DC

## 2010-12-27 MED ORDER — IBUPROFEN 400 MG PO TABS: ORAL_TABLET | ORAL | Status: DC

## 2010-12-27 MED ORDER — ROSUVASTATIN CALCIUM 10 MG PO TABS: 10.0000 mg | ORAL_TABLET | Freq: Every day | ORAL | Status: DC

## 2010-12-27 MED ORDER — FEXOFENADINE HCL 180 MG PO TABS: 180.0000 mg | ORAL_TABLET | Freq: Every day | ORAL | Status: DC

## 2010-12-27 NOTE — Patient Instructions (Addendum)
F/u in 3.5 months.  Increase the armour thyroid to two tablets Mon through Saturday, then one on Sunday  HBA1C andd TSH in 3.5 months and microalb, non fasting  Pls go back to toprol 200mg  daily, your blood pressure is high. If ankle swelling is a probl;em you can call  Med sent in for neck pain also

## 2010-12-27 NOTE — Progress Notes (Signed)
  Subjective:    Patient ID: [name], female    DOB: [birth year], 70 y.o.   MRN: [id number]  HPI  1 week h/o neck pain and spasm, no specific trigger identified, otherwise she states she has been fairly well. Blood sugars are regularly checked , and are seldom over 120 in the mornings States she cut back on the prescribed dose of toprol since she felt this was causing swelling of her ankles, unfortunately, her blood pressure is now out of control Review of Systems Denies recent fever or chills. Denies sinus pressure, nasal congestion, ear pain or sore throat. Denies chest congestion, productive cough or wheezing. Denies chest pains, palpitations, paroxysmal nocturnal dyspnea, orthopnea and leg swelling Denies abdominal pain, nausea, vomiting,diarrhea or constipation.  Denies rectal bleeding or change in bowel movement. Denies dysuria, frequency, hesitancy or incontinence.  Denies headaches, seizure, numbness, or tingling. Denies depression, anxiety or insomnia. Denies skin break down or rash.        Objective:   Physical Exam Patient alert and oriented and in no Cardiopulmonary distress.  HEENT: No facial asymmetry, EOMI, no sinus tenderness, TM's clear, Oropharynx pink and moist.  Neck decreased ROM with spasm, no adenopathy.  Chest: Clear to auscultation bilaterally.  CVS: S1, S2 no murmurs, no S3.  ABD: Soft non tender. Bowel sounds normal.  Ext: No edema  MS: decreased  ROM spine,adequate in  shoulders, hips and knees.  Skin: Intact, no ulcerations or rash noted.  Psych: Good eye contact, normal affect. Memory intact not anxious or depressed appearing.  CNS: CN 2-12 intact, power, tone and sensation normal throughout.        Assessment & Plan:

## 2010-12-28 ENCOUNTER — Telehealth: Payer: Self-pay | Admitting: Family Medicine

## 2010-12-28 DIAGNOSIS — E039 Hypothyroidism, unspecified: Secondary | ICD-10-CM

## 2010-12-28 MED ORDER — THYROID 30 MG PO TABS: ORAL_TABLET | ORAL | Status: DC

## 2010-12-28 NOTE — Telephone Encounter (Signed)
Resent with new quantity. Asked them to dispense 10 more.

## 2010-12-31 DIAGNOSIS — M62838 Other muscle spasm: Secondary | ICD-10-CM | POA: Insufficient documentation

## 2010-12-31 NOTE — Assessment & Plan Note (Signed)
Currently asymptomatic, follows regularly with cardiology

## 2010-12-31 NOTE — Assessment & Plan Note (Signed)
Uncontrolled due to non compliance with medication, reports possible s/e of ankle swelling from toprol which is unknown to me, she will attempt to resume the med

## 2010-12-31 NOTE — Assessment & Plan Note (Signed)
Controlled, no change in medication  

## 2010-12-31 NOTE — Assessment & Plan Note (Signed)
Slight dose increase in dose to normalize value of tsh

## 2010-12-31 NOTE — Assessment & Plan Note (Signed)
Muscle relaxant prescribed for 10 days only

## 2011-03-14 LAB — HEMOGLOBIN A1C
Hgb A1c MFr Bld: 6.2 % — ABNORMAL HIGH (ref <5.7)
Mean Plasma Glucose: 131 mg/dL — ABNORMAL HIGH (ref <117)

## 2011-03-14 LAB — MICROALBUMIN / CREATININE URINE RATIO
Creatinine, Urine: 78.2 mg/dL
Microalb Creat Ratio: 153.7 mg/g — ABNORMAL HIGH (ref 0.0–30.0)

## 2011-03-14 LAB — TSH: TSH: 1.759 u[IU]/mL (ref 0.350–4.500)

## 2011-03-22 ENCOUNTER — Other Ambulatory Visit: Payer: Self-pay | Admitting: Family Medicine

## 2011-04-10 ENCOUNTER — Encounter: Payer: Self-pay | Admitting: Family Medicine

## 2011-04-12 ENCOUNTER — Ambulatory Visit (INDEPENDENT_AMBULATORY_CARE_PROVIDER_SITE_OTHER): Payer: Medicare Other | Admitting: Family Medicine

## 2011-04-12 ENCOUNTER — Encounter: Payer: Self-pay | Admitting: Family Medicine

## 2011-04-12 VITALS — BP 150/70 | HR 70 | Resp 14 | Ht 61.0 in | Wt 204.4 lb

## 2011-04-12 DIAGNOSIS — E119 Type 2 diabetes mellitus without complications: Secondary | ICD-10-CM

## 2011-04-12 DIAGNOSIS — E785 Hyperlipidemia, unspecified: Secondary | ICD-10-CM

## 2011-04-12 DIAGNOSIS — E039 Hypothyroidism, unspecified: Secondary | ICD-10-CM

## 2011-04-12 DIAGNOSIS — R5383 Other fatigue: Secondary | ICD-10-CM

## 2011-04-12 DIAGNOSIS — I10 Essential (primary) hypertension: Secondary | ICD-10-CM

## 2011-04-12 DIAGNOSIS — R5381 Other malaise: Secondary | ICD-10-CM

## 2011-04-12 DIAGNOSIS — E669 Obesity, unspecified: Secondary | ICD-10-CM

## 2011-04-12 MED ORDER — METFORMIN HCL 500 MG PO TABS: 500.0000 mg | ORAL_TABLET | Freq: Every day | ORAL | Status: DC

## 2011-04-12 MED ORDER — AMLODIPINE BESYLATE 5 MG PO TABS: 5.0000 mg | ORAL_TABLET | Freq: Every day | ORAL | Status: DC

## 2011-04-12 MED ORDER — VALSARTAN-HYDROCHLOROTHIAZIDE 320-25 MG PO TABS: 1.0000 | ORAL_TABLET | Freq: Every day | ORAL | Status: DC

## 2011-04-12 MED ORDER — ROSUVASTATIN CALCIUM 10 MG PO TABS: 10.0000 mg | ORAL_TABLET | Freq: Every day | ORAL | Status: DC

## 2011-04-12 NOTE — Patient Instructions (Signed)
F/u mid to end January.  Fasting labs mid to end January  Blood sugar is good, pls cut back on carbohydrate and salt, and attempt to lose weight  A healthy diet is rich in fruit, vegetables and whole grains. Poultry fish, nuts and beans are a healthy choice for protein rather then red meat. A low sodium diet and drinking 64 ounces of water daily is generally recommended. Oils and sweet should be limited. Carbohydrates especially for those who are diabetic or overweight, should be limited to 30-45 gram per meal. It is important to eat on a regular schedule, at least 3 times daily. Snacks should be primarily fruits, vegetables or nuts.  It is important that you exercise regularly at least 30 minutes 5 times a week. If you develop chest pain, have severe difficulty breathing, or feel very tired, stop exercising immediately and seek medical attention

## 2011-04-15 ENCOUNTER — Encounter: Payer: Self-pay | Admitting: Family Medicine

## 2011-04-15 NOTE — Assessment & Plan Note (Signed)
Deteriorated. Patient re-educated about  the importance of commitment to a  minimum of 150 minutes of exercise per week. The importance of healthy food choices with portion control discussed. Encouraged to start a food diary, count calories and to consider  joining a support group. Sample diet sheets offered. Goals set by the patient for the next several months.    

## 2011-04-15 NOTE — Assessment & Plan Note (Signed)
Uncontrolled, no med change, intolerant of meds, lifestyle modification discussed and encouraged

## 2011-04-15 NOTE — Progress Notes (Signed)
  Subjective:    Patient ID: [name] [name], female    DOB: 16-Jul-1940, 70 y.o.   MRN: [id number]  HPI The PT is here for follow up and re-evaluation of chronic medical conditions, medication management and review of any available recent lab and radiology data.  Preventive health is updated, specifically  Cancer screening and Immunization.   Questions or concerns regarding consultations or procedures which the PT has had in the interim are  addressed. The PT denies any adverse reactions to current medications since the last visit.  There are no new concerns.  There are no specific complaints       Review of Systems See HPI Denies recent fever or chills. Denies sinus pressure, nasal congestion, ear pain or sore throat. Denies chest congestion, productive cough or wheezing. Denies chest pains, palpitations and leg swelling Denies abdominal pain, nausea, vomiting,diarrhea or constipation.   Denies dysuria, frequency, hesitancy or incontinence. Denies joint pain, swelling and limitation in mobility. Denies headaches, seizures, numbness, or tingling. Denies depression, anxiety or insomnia. Denies skin break down or rash.        Objective:   Physical Exam Patient alert and oriented and in no cardiopulmonary distress.  HEENT: No facial asymmetry, EOMI, no sinus tenderness,  oropharynx pink and moist.  Neck supple no adenopathy.  Chest: Clear to auscultation bilaterally.  CVS: S1, S2 no murmurs, no S3.  ABD: Soft non tender. Bowel sounds normal.  Ext: No edema  MS: Adequate ROM spine, shoulders, hips and knees.  Skin: Intact, no ulcerations or rash noted.  Psych: Good eye contact, normal affect. Memory intact not anxious or depressed appearing.  CNS: CN 2-12 intact, power, tone and sensation normal throughout.        Assessment & Plan:

## 2011-04-15 NOTE — Assessment & Plan Note (Signed)
Controlled, no change in medication  

## 2011-04-15 NOTE — Assessment & Plan Note (Signed)
Controlled, no change in medication Needs fasting lipid panel next visit

## 2011-04-28 ENCOUNTER — Other Ambulatory Visit: Payer: Self-pay | Admitting: Family Medicine

## 2011-05-02 ENCOUNTER — Telehealth: Payer: Self-pay | Admitting: Family Medicine

## 2011-05-02 MED ORDER — THYROID 30 MG PO TABS: 30.0000 mg | ORAL_TABLET | Freq: Every day | ORAL | Status: DC

## 2011-05-02 NOTE — Telephone Encounter (Signed)
Med refilled as requested

## 2011-05-15 ENCOUNTER — Telehealth: Payer: Self-pay | Admitting: Family Medicine

## 2011-05-15 MED ORDER — METFORMIN HCL 500 MG PO TABS: 500.0000 mg | ORAL_TABLET | Freq: Every day | ORAL | Status: DC

## 2011-05-17 NOTE — Telephone Encounter (Signed)
Patient is aware 

## 2011-06-12 ENCOUNTER — Telehealth: Payer: Self-pay | Admitting: Family Medicine

## 2011-06-12 MED ORDER — VALSARTAN-HYDROCHLOROTHIAZIDE 320-25 MG PO TABS: 1.0000 | ORAL_TABLET | Freq: Every day | ORAL | Status: DC

## 2011-06-12 MED ORDER — ROSUVASTATIN CALCIUM 10 MG PO TABS: 10.0000 mg | ORAL_TABLET | Freq: Every day | ORAL | Status: DC

## 2011-06-12 NOTE — Telephone Encounter (Signed)
Sent in

## 2011-06-21 ENCOUNTER — Other Ambulatory Visit: Payer: Self-pay | Admitting: Family Medicine

## 2011-07-05 ENCOUNTER — Ambulatory Visit: Payer: Medicare Other | Admitting: Family Medicine

## 2011-07-09 ENCOUNTER — Encounter: Payer: Self-pay | Admitting: Family Medicine

## 2011-07-10 ENCOUNTER — Ambulatory Visit (INDEPENDENT_AMBULATORY_CARE_PROVIDER_SITE_OTHER): Payer: Medicare Other | Admitting: Family Medicine

## 2011-07-10 ENCOUNTER — Encounter: Payer: Self-pay | Admitting: Family Medicine

## 2011-07-10 VITALS — BP 170/84 | HR 50 | Resp 18 | Ht 61.0 in | Wt 198.0 lb

## 2011-07-10 DIAGNOSIS — E785 Hyperlipidemia, unspecified: Secondary | ICD-10-CM

## 2011-07-10 DIAGNOSIS — I10 Essential (primary) hypertension: Secondary | ICD-10-CM

## 2011-07-10 DIAGNOSIS — N189 Chronic kidney disease, unspecified: Secondary | ICD-10-CM

## 2011-07-10 DIAGNOSIS — J209 Acute bronchitis, unspecified: Secondary | ICD-10-CM

## 2011-07-10 DIAGNOSIS — E119 Type 2 diabetes mellitus without complications: Secondary | ICD-10-CM

## 2011-07-10 DIAGNOSIS — J309 Allergic rhinitis, unspecified: Secondary | ICD-10-CM

## 2011-07-10 MED ORDER — METHYLPREDNISOLONE ACETATE 40 MG/ML INJ SUSP (RADIOLOG
120.0000 mg | Freq: Once | INTRAMUSCULAR | Status: AC
  Administered 2011-07-10: 120 mg via INTRAMUSCULAR

## 2011-07-10 MED ORDER — PREDNISONE (PAK) 5 MG PO TABS: 5.0000 mg | ORAL_TABLET | ORAL | Status: DC

## 2011-07-10 MED ORDER — ALBUTEROL SULFATE (5 MG/ML) 0.5% IN NEBU
2.5000 mg | INHALATION_SOLUTION | Freq: Once | RESPIRATORY_TRACT | Status: AC
  Administered 2011-07-10: 2.5 mg via RESPIRATORY_TRACT

## 2011-07-10 MED ORDER — IPRATROPIUM BROMIDE 0.02 % IN SOLN
0.5000 mg | Freq: Once | RESPIRATORY_TRACT | Status: AC
  Administered 2011-07-10: 0.5 mg via RESPIRATORY_TRACT

## 2011-07-10 MED ORDER — FLUTICASONE PROPIONATE 50 MCG/ACT NA SUSP: 2.0000 | Freq: Every day | NASAL | Status: DC

## 2011-07-10 NOTE — Assessment & Plan Note (Signed)
Uncontrolled, med compliance a rwal issue , I doubt pt taking all meds as prescribed, when I mentioned incereasing the amlodipine dose she started muttering about leg swelling. Recent ED visit makes no mention of high bP, no med change now, compliance stressed

## 2011-07-10 NOTE — Assessment & Plan Note (Signed)
Uncontrolled , excessive nasal congestion and stuffiness, pt to start flonas

## 2011-07-10 NOTE — Patient Instructions (Signed)
F/U as before. You have acute bronchitis, you are still wheezing . Breathing treatment in the office and depo medrol, also prednisone dose pack prescribed. Pls finish the antibiotic prescribed in the ED.  Nasal spray and prednisone dose pack are also prescribed  Your blood pressure is too high, on your return if still high adjustments will be made to your medication. Please ensure you take all blood pressure medication every day at the same time(s)

## 2011-07-10 NOTE — Progress Notes (Signed)
  Subjective:    Patient ID: [name] [name], female    DOB: 14-Apr-1941, 71 y.o.   MRN: [id number]  HPI Treated on 07/05/2011 for axcute bronchitis, has amoxicillin and albuterol, drainage and sputum primarily clear , slightly green also, no fever or chills. Fells better though still very congested and reports excessive wheezing and shortness of breath with limited activity. Reports good blood sugars , fasting seldom over 120. Reports compliance with medication , though this is debatable with respect to her BP meds   Review of Systems See HPI  Denies sinus pressure, nasal congestion, ear pain or sore throat.  Denies chest pains, palpitations and leg swelling Denies abdominal pain, nausea, vomiting,diarrhea or constipation.   Denies dysuria, frequency, hesitancy or incontinence. Denies joint pain, swelling and limitation in mobility. Denies headaches, seizures, numbness, or tingling. Denies depression, anxiety or insomnia. Denies skin break down or rash.        Objective:   Physical Exam Patient alert and oriented and in no cardiopulmonary distress.  HEENT: No facial asymmetry, EOMI, no sinus tenderness,  oropharynx pink and moist.  Neck supple no adenopathy.  Chest:decreased air entry, bilateral wheeze and crackles  CVS: S1, S2 no murmurs, no S3.  ABD: Soft non tender. Bowel sounds normal.  Ext: No edema  MS: Adequate ROM spine, shoulders, hips and knees.  Skin: Intact, no ulcerations or rash noted.  Psych: Good eye contact, normal affect. Memory intact not anxious or depressed appearing.  CNS: CN 2-12 intact, power, tone and sensation normal throughout.        Assessment & Plan:

## 2011-07-10 NOTE — Assessment & Plan Note (Signed)
Acute bronchitis, still wheezing and dyspneic, neb treatment in office and depo medrol injection to be followed by 1 dose pack

## 2011-07-11 DIAGNOSIS — N189 Chronic kidney disease, unspecified: Secondary | ICD-10-CM | POA: Insufficient documentation

## 2011-07-11 NOTE — Assessment & Plan Note (Signed)
Controlled, no change in medication Updated labs before next visit

## 2011-07-11 NOTE — Assessment & Plan Note (Signed)
Controlled, no change in medication  

## 2011-07-11 NOTE — Assessment & Plan Note (Signed)
Significant proteinuria, likekly due to stage 2 HTN , uncontrolled

## 2011-07-20 LAB — COMPLETE METABOLIC PANEL WITH GFR
AST: 24 U/L (ref 0–37)
BUN: 25 mg/dL — ABNORMAL HIGH (ref 6–23)
Calcium: 9.5 mg/dL (ref 8.4–10.5)
Chloride: 104 mEq/L (ref 96–112)
Creat: 1.12 mg/dL — ABNORMAL HIGH (ref 0.50–1.10)

## 2011-07-20 LAB — CBC WITH DIFFERENTIAL/PLATELET
Eosinophils Relative: 5 % (ref 0–5)
Lymphocytes Relative: 38 % (ref 12–46)
Lymphs Abs: 3.6 10*3/uL (ref 0.7–4.0)
MCV: 90.1 fL (ref 78.0–100.0)
Neutrophils Relative %: 50 % (ref 43–77)
Platelets: 199 10*3/uL (ref 150–400)
RBC: 4.25 MIL/uL (ref 3.87–5.11)
WBC: 9.5 10*3/uL (ref 4.0–10.5)

## 2011-07-20 LAB — LIPID PANEL
Cholesterol: 152 mg/dL (ref 0–200)
HDL: 52 mg/dL (ref >39)
Total CHOL/HDL Ratio: 2.9 Ratio
Triglycerides: 117 mg/dL (ref <150)

## 2011-07-24 ENCOUNTER — Encounter: Payer: Self-pay | Admitting: Cardiology

## 2011-07-24 DIAGNOSIS — I2510 Atherosclerotic heart disease of native coronary artery without angina pectoris: Secondary | ICD-10-CM | POA: Insufficient documentation

## 2011-07-24 DIAGNOSIS — R9430 Abnormal result of cardiovascular function study, unspecified: Secondary | ICD-10-CM | POA: Insufficient documentation

## 2011-07-25 ENCOUNTER — Encounter: Payer: Self-pay | Admitting: Family Medicine

## 2011-07-25 ENCOUNTER — Encounter: Payer: Self-pay | Admitting: Cardiology

## 2011-07-25 ENCOUNTER — Ambulatory Visit (INDEPENDENT_AMBULATORY_CARE_PROVIDER_SITE_OTHER): Payer: Medicare Other | Admitting: Cardiology

## 2011-07-25 DIAGNOSIS — E785 Hyperlipidemia, unspecified: Secondary | ICD-10-CM

## 2011-07-25 DIAGNOSIS — I10 Essential (primary) hypertension: Secondary | ICD-10-CM

## 2011-07-25 DIAGNOSIS — I779 Disorder of arteries and arterioles, unspecified: Secondary | ICD-10-CM

## 2011-07-25 DIAGNOSIS — I2510 Atherosclerotic heart disease of native coronary artery without angina pectoris: Secondary | ICD-10-CM

## 2011-07-25 NOTE — Assessment & Plan Note (Signed)
The patient has significant carotid disease. She needs a one-year followup Doppler soon. This will be arranged.

## 2011-07-25 NOTE — Assessment & Plan Note (Signed)
Her blood pressure is elevated today. She has had a recent course of steroids. She will be seeing her primary physician tomorrow. The patient will talk with her primary physician tomorrow about her blood pressure and decide the next step.

## 2011-07-25 NOTE — Progress Notes (Signed)
HPI  Patient is seen to followup coronary artery disease. I saw her last September, 2011. She does have carotid disease that is being followed carefully. She had a Doppler since seeing me last in February of 2012. At that time she had less than [id number] LICA and 0000000 R. ICA. This was not changed significantly. She needs a followup soon.  Recently she was seen in the emergency room with bronchitis. Since then she has seen her primary physician and will see her again tomorrow. She's not having any significant chest pain.  Historically the patient underwent CABG in 2005. She had a nuclear scan in July, 2009. There was a small lateral fixed defect at that time but no ischemia. Ejection fraction was 70%. We do not have any echo data.  As part of today's evaluation I have reviewed the patient's old records and completely updated her new electronic medical record.  Allergies  Allergen Reactions  . Clonidine Derivatives Other (See Comments)    Zombie BP dropped    Current Outpatient Prescriptions  Medication Sig Dispense Refill  . amLODipine (NORVASC) 5 MG tablet Take 1 tablet (5 mg total) by mouth daily.  30 tablet  5  . ARMOUR THYROID 30 MG tablet TAKE 2 TABLETS MONDAY-SATURDAY, AND 1 TABLET ON SUNDAY  52 each  3  . aspirin (ASPIRIN LOW DOSE) 81 MG EC tablet Take 81 mg by mouth daily. Take one tablet by mouth once a day       . Cholecalciferol (VITAMIN D3) 1000 UNITS capsule Take 1,000 Units by mouth daily. Take 1 tablet by mouth once a day       . DORZOLAMIDE HCL-TIMOLOL MAL OP Apply to eye 2 (two) times daily. One drop in each eye two times a day       . fexofenadine (ALLEGRA) 180 MG tablet TAKE 1 TABLET ONCE DAILY  90 tablet  1  . fluticasone (FLONASE) 50 MCG/ACT nasal spray Place 2 sprays into the nose daily.  16 g  2  . latanoprost (XALATAN) 0.005 % ophthalmic solution Place 1 drop into both eyes at bedtime. One drop each eye at bedtime       . metFORMIN (GLUCOPHAGE) 500 MG tablet Take 1 tablet  (500 mg total) by mouth daily with breakfast. Take one tablet by mouth once daily Pt requests 90 day supply  90 tablet  1  . Multiple Vitamins-Minerals (THERATRUM COMPLETE 50 PLUS PO) Take by mouth. Take one tablet by mouth daily        . Omega-3 Fatty Acids (FISH OIL DOUBLE STRENGTH) 1200 MG CAPS Take by mouth 2 (two) times daily. Take one tablet by mouth two times a day       . rosuvastatin (CRESTOR) 10 MG tablet Take 1 tablet (10 mg total) by mouth daily. One tablet by mouth once daily   90 tablet  1  . TOPROL XL 200 MG 24 hr tablet TAKE 1 TABLET ONCE DAILY  90 each  1  . valsartan-hydrochlorothiazide (DIOVAN HCT) 320-25 MG per tablet Take 1 tablet by mouth daily. One table by mouth once daily    90 tablet  1    History   Social History  . Marital Status: Married    Spouse Name: N/A    Number of Children: 1  . Years of Education: N/A   Occupational History  . retired     Social History Main Topics  . Smoking status: Former Smoker -- 0.3 packs/day for 40 years  Types: Cigarettes    Quit date: 07/03/2003  . Smokeless tobacco: Never Used  . Alcohol Use: No  . Drug Use: No  . Sexually Active: Not on file   Other Topics Concern  . Not on file   Social History Narrative  . No narrative on file    Family History  Problem Relation Age of Onset  . Aneurysm Mother   . Hypertension Mother   . Coronary artery disease Mother   . Coronary artery disease Father   . Diabetes Sister   . Hypertension Sister   . Coronary artery disease Sister   . Diabetes Son     Past Medical History  Diagnosis Date  . Obesity   . 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 Hyperlipidemia   . Hypertension   . Glaucoma dx 2010    had laser to both eyes in 2010  . Hypothyroidism   . Renal insufficiency, mild   . Carotid artery disease 11/2008    doppler 11/2008.Marland KitchenMarland Kitchen50% RICA / doppler 03/2010.Marland KitchenMarland KitchenMarland Kitchen60-79% RICA ...less than [id number]  LICA.Marland KitchenMarland KitchenPlan   . Hx of CABG     2005  . Ejection fraction     EF 70%, nuclear, July, 2009     Past Surgical History  Procedure Date  . 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 All other systems are reviewed and are negative.  PHYSICAL EXAM  Patient is stable today. She is overweight. Head is atraumatic. There is no xanthelasma. There is no jugulovenous distention. Lungs are clear. Respiratory effort is nonlabored. Cardiac exam reveals S1 and S2. There no clicks or significant murmurs. The abdomen is soft. It is no significant peripheral edema.  Filed Vitals:   07/25/11 1104  BP: 169/91  Pulse: 51  Height: 5\' 1"  (1.549 m)  Weight: 196 lb (88.905 kg)    EKG I have reviewed the EKG from July 04, 2011. This was done when she was in the emergency room. She has diffuse T-wave inversions. There is no significant change since prior EKG.  ASSESSMENT & PLAN

## 2011-07-25 NOTE — Assessment & Plan Note (Signed)
Coronary disease is stable. She does not need any further testing at this time. No change in therapy.

## 2011-07-25 NOTE — Assessment & Plan Note (Signed)
Her lipids are being treated. No change in therapy. 

## 2011-07-25 NOTE — Patient Instructions (Addendum)
Your physician you to follow up in 1 year. You will receive a reminder letter in the mail one-two months in advance. If you don't receive a letter, please call our office to schedule the follow-up appointment. Your physician recommends that you continue on your current medications as directed. Please refer to the Current Medication list given to you today. Your physician has requested that you have a carotid duplex. This test is an ultrasound of the carotid arteries in your neck. It looks at blood flow through these arteries that supply the brain with blood. Allow one hour for this exam. There are no restrictions or special instructions. If the results of your test are normal or stable, you will receive a letter. If they are abnormal, the nurse will contact you by phone.

## 2011-07-26 ENCOUNTER — Ambulatory Visit (INDEPENDENT_AMBULATORY_CARE_PROVIDER_SITE_OTHER): Payer: Medicare Other | Admitting: Family Medicine

## 2011-07-26 ENCOUNTER — Encounter: Payer: Self-pay | Admitting: Family Medicine

## 2011-07-26 VITALS — BP 150/80 | HR 60 | Resp 18 | Ht 61.0 in | Wt 197.0 lb

## 2011-07-26 DIAGNOSIS — E119 Type 2 diabetes mellitus without complications: Secondary | ICD-10-CM

## 2011-07-26 DIAGNOSIS — E039 Hypothyroidism, unspecified: Secondary | ICD-10-CM

## 2011-07-26 DIAGNOSIS — I10 Essential (primary) hypertension: Secondary | ICD-10-CM

## 2011-07-26 DIAGNOSIS — E785 Hyperlipidemia, unspecified: Secondary | ICD-10-CM

## 2011-07-26 DIAGNOSIS — J309 Allergic rhinitis, unspecified: Secondary | ICD-10-CM

## 2011-07-26 DIAGNOSIS — N760 Acute vaginitis: Secondary | ICD-10-CM

## 2011-07-26 MED ORDER — CETIRIZINE HCL 10 MG PO CHEW: 10.0000 mg | CHEWABLE_TABLET | Freq: Every day | ORAL | Status: DC

## 2011-07-26 MED ORDER — FLUCONAZOLE 150 MG PO TABS: ORAL_TABLET | ORAL | Status: AC

## 2011-07-26 NOTE — Patient Instructions (Signed)
F/U in 4 month  HBA1C  And tSH in 4 month   Cholesterol and blood sugar are excellent, blood pressure improved , but still too high  Cut back on salt, and eat a lot of vegetables

## 2011-07-28 NOTE — Assessment & Plan Note (Signed)
Controlled, no change in medication  

## 2011-07-28 NOTE — Progress Notes (Signed)
  Subjective:    Patient ID: [name] [name], female    DOB: 02/16/1941, 71 y.o.   MRN: [id number]  HPI The PT is here for follow up and re-evaluation of chronic medical conditions, medication management and review of any available recent lab and radiology data.  Preventive health is updated, specifically  Cancer screening and Immunization.   Questions or concerns regarding consultations or procedures which the PT has had in the interim are  addressed. The PT denies any adverse reactions to current medications since the last visit.  There are no new concerns.  There are no specific complaints  She is much improved, was recently seen acutely ill will respiratory infection, which has resolved Her concern is that the allegra is not covered, and wants to try alternate lower cost med Blood sugars when teste are excellent, fasting seldom over 120     Review of Systems See HPI Denies recent fever or chills. Denies sinus pressure, nasal congestion, ear pain or sore throat. Denies chest congestion, productive cough or wheezing. Denies chest pains, palpitations and leg swelling Denies abdominal pain, nausea, vomiting,diarrhea or constipation.   Denies dysuria, frequency, hesitancy or incontinence. Chronic back pain which limits mobility, uses a cane at times Denies headaches, seizures, numbness, or tingling. Denies depression, anxiety or insomnia. Denies skin break down or rash.        Objective:   Physical Exam  Patient alert and oriented and in no cardiopulmonary distress.  HEENT: No facial asymmetry, EOMI, no sinus tenderness,  oropharynx pink and moist.  Neck supple no adenopathy.  Chest: Clear to auscultation bilaterally.  CVS: S1, S2 no murmurs, no S3.  ABD: Soft non tender. Bowel sounds normal.  Ext: No edema  MS: Adequate though reduced  ROM spine, shoulders, hips and knees.  Skin: Intact, no ulcerations or rash noted.  Psych: Good eye contact, normal affect. Memory  intact not anxious or depressed appearing.  CNS: CN 2-12 intact, power, tone and sensation normal throughout.       Assessment & Plan:

## 2011-07-28 NOTE — Assessment & Plan Note (Signed)
C/o vaginal itch following recent antibiotics, will prescribe diflucan

## 2011-07-28 NOTE — Assessment & Plan Note (Signed)
Uncontrolled , reports intolerance to all medication adjustments which involve dose increases

## 2011-08-02 ENCOUNTER — Telehealth: Payer: Self-pay | Admitting: Family Medicine

## 2011-08-02 DIAGNOSIS — M542 Cervicalgia: Secondary | ICD-10-CM

## 2011-08-02 NOTE — Telephone Encounter (Signed)
Do you want to refill either of these?

## 2011-08-02 NOTE — Telephone Encounter (Signed)
Ok to refill both x 1

## 2011-08-07 MED ORDER — CYCLOBENZAPRINE HCL 5 MG PO TABS: ORAL_TABLET | ORAL | Status: DC

## 2011-08-07 MED ORDER — IBUPROFEN 400 MG PO TABS: ORAL_TABLET | ORAL | Status: AC

## 2011-08-07 NOTE — Telephone Encounter (Signed)
Sent in x 1 per Dr

## 2011-08-23 ENCOUNTER — Other Ambulatory Visit: Payer: Self-pay | Admitting: Family Medicine

## 2011-08-23 DIAGNOSIS — Z139 Encounter for screening, unspecified: Secondary | ICD-10-CM

## 2011-08-31 ENCOUNTER — Other Ambulatory Visit: Payer: Self-pay | Admitting: Family Medicine

## 2011-09-03 ENCOUNTER — Other Ambulatory Visit: Payer: Self-pay | Admitting: Family Medicine

## 2011-09-03 ENCOUNTER — Telehealth: Payer: Self-pay | Admitting: Family Medicine

## 2011-09-03 MED ORDER — THYROID 30 MG PO TABS: ORAL_TABLET | ORAL | Status: DC

## 2011-09-03 MED ORDER — ALBUTEROL SULFATE HFA 108 (90 BASE) MCG/ACT IN AERS: 2.0000 | INHALATION_SPRAY | Freq: Four times a day (QID) | RESPIRATORY_TRACT | Status: DC | PRN

## 2011-09-03 NOTE — Telephone Encounter (Signed)
Pt aware. Will get inhaler and call back if she doesn't feel better

## 2011-09-03 NOTE — Telephone Encounter (Signed)
Pt would like to know if she can get nebulizer sent in for wheezing?  She has been wheezing about 3 weeks, coughing up some clear phlegm. I could hear her wheezing. Said the neb helped that she got in the office.

## 2011-09-03 NOTE — Telephone Encounter (Signed)
pls advise the hand inhaler has the same med and is safer and more readily covered based on her history, so this has been sent in

## 2011-09-05 ENCOUNTER — Encounter (INDEPENDENT_AMBULATORY_CARE_PROVIDER_SITE_OTHER): Payer: Medicare Other

## 2011-09-05 DIAGNOSIS — I6529 Occlusion and stenosis of unspecified carotid artery: Secondary | ICD-10-CM

## 2011-09-05 DIAGNOSIS — I779 Disorder of arteries and arterioles, unspecified: Secondary | ICD-10-CM

## 2011-09-13 ENCOUNTER — Other Ambulatory Visit: Payer: Self-pay

## 2011-09-13 ENCOUNTER — Encounter: Payer: Self-pay | Admitting: Family Medicine

## 2011-09-13 ENCOUNTER — Ambulatory Visit (INDEPENDENT_AMBULATORY_CARE_PROVIDER_SITE_OTHER): Payer: Medicare Other | Admitting: Family Medicine

## 2011-09-13 VITALS — BP 160/74 | HR 52 | Temp 97.8°F | Resp 16 | Ht 61.0 in | Wt 202.4 lb

## 2011-09-13 DIAGNOSIS — E785 Hyperlipidemia, unspecified: Secondary | ICD-10-CM

## 2011-09-13 DIAGNOSIS — E039 Hypothyroidism, unspecified: Secondary | ICD-10-CM

## 2011-09-13 DIAGNOSIS — J42 Unspecified chronic bronchitis: Secondary | ICD-10-CM

## 2011-09-13 DIAGNOSIS — I10 Essential (primary) hypertension: Secondary | ICD-10-CM

## 2011-09-13 DIAGNOSIS — E119 Type 2 diabetes mellitus without complications: Secondary | ICD-10-CM

## 2011-09-13 MED ORDER — ALBUTEROL SULFATE (5 MG/ML) 0.5% IN NEBU
2.5000 mg | INHALATION_SOLUTION | Freq: Once | RESPIRATORY_TRACT | Status: AC
  Administered 2011-09-13: 2.5 mg via RESPIRATORY_TRACT

## 2011-09-13 MED ORDER — IPRATROPIUM BROMIDE 0.02 % IN SOLN
0.5000 mg | Freq: Once | RESPIRATORY_TRACT | Status: AC
  Administered 2011-09-13: 0.5 mg via RESPIRATORY_TRACT

## 2011-09-13 MED ORDER — PREDNISONE (PAK) 5 MG PO TABS: 5.0000 mg | ORAL_TABLET | ORAL | Status: DC

## 2011-09-13 MED ORDER — METOPROLOL SUCCINATE ER 200 MG PO TB24: ORAL_TABLET | ORAL | Status: DC

## 2011-09-13 MED ORDER — METHYLPREDNISOLONE ACETATE 80 MG/ML IJ SUSP
80.0000 mg | Freq: Once | INTRAMUSCULAR | Status: AC
  Administered 2011-09-13: 80 mg via INTRAMUSCULAR

## 2011-09-13 MED ORDER — VALSARTAN-HYDROCHLOROTHIAZIDE 320-25 MG PO TABS: 1.0000 | ORAL_TABLET | Freq: Every day | ORAL | Status: DC

## 2011-09-13 MED ORDER — AMLODIPINE BESYLATE 5 MG PO TABS: 5.0000 mg | ORAL_TABLET | Freq: Every day | ORAL | Status: DC

## 2011-09-13 MED ORDER — IPRATROPIUM-ALBUTEROL 0.5-2.5 (3) MG/3ML IN SOLN: 3.0000 mL | Freq: Three times a day (TID) | RESPIRATORY_TRACT | Status: DC | PRN

## 2011-09-13 NOTE — Patient Instructions (Signed)
F/u in 6 weeks.  You will receive a breathing treatment and depomedrol 80mg  in the office.Prednisone is also sent in to your pharmacy   A nebuliising machine with treatments will be prescribed also  You are referred to Dr Luan Pulling for further evaluation of chronic cough and also for pulmonary function testing

## 2011-09-15 NOTE — Assessment & Plan Note (Signed)
Controlled, no change in medication  

## 2011-09-15 NOTE — Assessment & Plan Note (Signed)
Hyperlipidemia:Low fat diet discussed and encouraged.  Controlled, no change in medication   

## 2011-09-15 NOTE — Assessment & Plan Note (Signed)
Uncontrolled, however intolerant of medication adjustments to get to goal

## 2011-09-15 NOTE — Assessment & Plan Note (Signed)
Symptomatic x 2 months, relatively new onset, no improvement on medication, will refer for pFT and to pulmonary

## 2011-09-15 NOTE — Progress Notes (Signed)
  Subjective:    Patient ID: [name] [name], female    DOB: 1940/07/31, 71 y.o.   MRN: [id number]  HPI 2 month h/o chest congestion with cough and wheeze which is mainly noon productive. Initially seen at [facility] ED, Had chest Ct, continues to be very symptomatic, and has not had this problem before.Denies any recent fever or chills, denies sinus pressure, sore throat, ear pain .   Review of Systems See HPI Denies recent fever or chills.c/o fatigue due to excessive cough , with sleep disturbance Denies sinus pressure, nasal congestion, ear pain or sore throat. Denies chest congestion or  productive cough  Denies chest pains, palpitations and leg swelling Denies abdominal pain, nausea, vomiting,diarrhea or constipation.   Denies dysuria, frequency, hesitancy or incontinence. Chronic  joint pain, with mild  limitation in mobility. Denies headaches, seizures, numbness, or tingling. Denies depression, anxiety or insomnia. Denies skin break down or rash.        Objective:   Physical Exam Patient alert and oriented and in mild  cardiopulmonary distress.mildly anxious  HEENT: No facial asymmetry, EOMI, no sinus tenderness,  oropharynx pink and moist.  Neck supple no adenopathy.  Chest: decreased air entry with bilateral wheezes, no crackles   CVS: S1, S2 no murmurs, no S3.  ABD: Soft non tender. Bowel sounds normal.  Ext: No edema  MS: Adequate though reduced  ROM spine, shoulders, hips and knees.  Skin: Intact, no ulcerations or rash noted.  Psych: Good eye contact, normal affect. Memory intact  anxious but  depressed appearing.  CNS: CN 2-12 intact, power, tone and sensation normal throughout.       Assessment & Plan:

## 2011-09-17 ENCOUNTER — Telehealth: Payer: Self-pay | Admitting: *Deleted

## 2011-09-17 NOTE — Telephone Encounter (Signed)
Pt notified of results and verbalized understanding  

## 2011-09-17 NOTE — Telephone Encounter (Signed)
Message copied by Marcille Buffy on Mon Sep 17, 2011 10:26 AM ------      Message from: [place],  D      Created: Sun Sep 16, 2011 10:27 AM       Plan f/u study in 6 month as recommended.

## 2011-09-18 NOTE — Telephone Encounter (Signed)
Pt left message on voicemail that she would like a return call to discuss results further.   Pt wants to know if there is something else she can do to prevent carotid stenosis from worsening. Pt states she takes her medicine for her cholesterol and diabetes. She tries not to eat anything with cholesterol in it. Pt denies smoking. Pt notified it sounds like she is doing all she can at present. Instructed to remain complaint with meds and diet and we will recheck carotid in 6 months. Pt verbalized understanding.

## 2011-10-02 ENCOUNTER — Ambulatory Visit (HOSPITAL_COMMUNITY)
Admission: RE | Admit: 2011-10-02 | Discharge: 2011-10-02 | Disposition: A | Discharge status: Home / Self Care | Payer: Medicare Other | Source: Ambulatory Visit | Attending: Family Medicine | Admitting: Family Medicine

## 2011-10-02 DIAGNOSIS — R0989 Other specified symptoms and signs involving the circulatory and respiratory systems: Secondary | ICD-10-CM | POA: Insufficient documentation

## 2011-10-02 DIAGNOSIS — R0609 Other forms of dyspnea: Secondary | ICD-10-CM | POA: Insufficient documentation

## 2011-10-02 DIAGNOSIS — J42 Unspecified chronic bronchitis: Secondary | ICD-10-CM

## 2011-10-03 NOTE — Procedures (Signed)
NAMEESTEFANNY, RYNKIEWICZ               ACCOUNT NO.:  [id number]  MEDICAL RECORD NO.:  [id number]  LOCATION:  RESP                          FACILITY:  [facility]  PHYSICIAN:  Toi Stelly L. Luan Pulling, M.D.DATE OF BIRTH:  1940/09/17  DATE OF PROCEDURE: DATE OF DISCHARGE:  10/02/2011                           PULMONARY FUNCTION TEST   Reason for pulmonary function testing is chronic bronchitis.  1. Spirometry shows a mild ventilatory defect without definite airflow     obstruction. 2. Lung volumes also show mild reduction in total lung capacity. 3. DLCO is moderately reduced but corrects when accounting for     ventilation. 4. Airway resistance is elevated. 5. Noting the patient's height and weight, some of the restrictive     abnormality maybe related to body habitus.     Jermar Colter L. Luan Pulling, M.D.     [facility]/MEDQ  D:  10/03/2011  T:  10/03/2011  Job:  [id number]  cc:   Norwood Levo. Moshe Cipro, M.D. Fax: [phone]

## 2011-10-17 LAB — PULMONARY FUNCTION TEST

## 2011-10-24 ENCOUNTER — Ambulatory Visit (INDEPENDENT_AMBULATORY_CARE_PROVIDER_SITE_OTHER): Payer: Medicare Other | Admitting: Family Medicine

## 2011-10-24 ENCOUNTER — Encounter: Payer: Self-pay | Admitting: Family Medicine

## 2011-10-24 VITALS — BP 172/84 | HR 64 | Resp 18 | Ht 61.0 in | Wt 199.0 lb

## 2011-10-24 DIAGNOSIS — I10 Essential (primary) hypertension: Secondary | ICD-10-CM

## 2011-10-24 DIAGNOSIS — E785 Hyperlipidemia, unspecified: Secondary | ICD-10-CM

## 2011-10-24 DIAGNOSIS — L0390 Cellulitis, unspecified: Secondary | ICD-10-CM | POA: Insufficient documentation

## 2011-10-24 DIAGNOSIS — B029 Zoster without complications: Secondary | ICD-10-CM

## 2011-10-24 DIAGNOSIS — L0291 Cutaneous abscess, unspecified: Secondary | ICD-10-CM

## 2011-10-24 DIAGNOSIS — E119 Type 2 diabetes mellitus without complications: Secondary | ICD-10-CM

## 2011-10-24 MED ORDER — ACYCLOVIR 800 MG PO TABS: ORAL_TABLET | ORAL | Status: AC

## 2011-10-24 MED ORDER — DOXYCYCLINE HYCLATE 100 MG PO TABS: 100.0000 mg | ORAL_TABLET | Freq: Two times a day (BID) | ORAL | Status: AC

## 2011-10-24 NOTE — Progress Notes (Signed)
  Subjective:    Patient ID: [name] [name], female    DOB: 1941/02/16, 71 y.o.   MRN: [id number]  HPI 2 day h/o vesicular red rash on right thigh which is spreading, no fever or chills,positive  h/o chilhhodchicken pox. Denies polyuria, poldipsia or hypoglycemic episodes, b;lood sugar remains well controlled when checked   Review of Systems See HPI Denies recent fever or chills. Denies sinus pressure, nasal congestion, ear pain or sore throat. Denies chest congestion, productive cough or wheezing. Denies chest pains, palpitations and leg swelling Denies abdominal pain, nausea, vomiting,diarrhea or constipation.   Denies dysuria, frequency, hesitancy or incontinence. Denies joint pain, swelling and limitation in mobility. Denies headaches, seizures, numbness, or tingling. Denies depression, anxiety or insomnia.       Objective:   Physical Exam Patient alert and oriented and in no cardiopulmonary distress.  HEENT: No facial asymmetry, EOMI, no sinus tenderness,  oropharynx pink and moist.  Neck supple no adenopathy.  Chest: Clear to auscultation bilaterally.  CVS: S1, S2 no murmurs, no S3.  ABD: Soft non tender. Bowel sounds normal.  Ext: No edema  MS: decreased  ROM spine, shoulders, hips and knees.  Skin: erythematous vesicular rash on thigh with exudate  Psych: Good eye contact, normal affect. Memory intact not anxious or depressed appearing.  CNS: CN 2-12 intact, power, tone and sensation normal throughout.        Assessment & Plan:

## 2011-10-24 NOTE — Patient Instructions (Signed)
F/u as before.  Please call if rash worsens.  Medication is sent in for shingles as well as bacterial skin infection. It is important you take both until done

## 2011-10-28 NOTE — Assessment & Plan Note (Signed)
Chronically uncontrolled , pt reports intolerance to medication

## 2011-10-28 NOTE — Assessment & Plan Note (Signed)
Hyperlipidemia:Low fat diet discussed and encouraged.  Controlled, no change in medication   

## 2011-10-28 NOTE — Assessment & Plan Note (Signed)
Acute outbreak , first episode, oral antiviral prescribed

## 2011-10-28 NOTE — Assessment & Plan Note (Signed)
Controlled, no change in medication  

## 2011-10-28 NOTE — Assessment & Plan Note (Signed)
Bacterial super infection evident, antibiotic prescribed

## 2011-10-29 ENCOUNTER — Ambulatory Visit (HOSPITAL_COMMUNITY): Payer: Medicare Other

## 2011-11-02 ENCOUNTER — Other Ambulatory Visit: Payer: Self-pay | Admitting: Family Medicine

## 2011-11-05 ENCOUNTER — Ambulatory Visit (HOSPITAL_COMMUNITY)
Admission: RE | Admit: 2011-11-05 | Discharge: 2011-11-05 | Disposition: A | Discharge status: Home / Self Care | Payer: Medicare Other | Source: Ambulatory Visit | Attending: Family Medicine | Admitting: Family Medicine

## 2011-11-05 DIAGNOSIS — Z1231 Encounter for screening mammogram for malignant neoplasm of breast: Secondary | ICD-10-CM | POA: Insufficient documentation

## 2011-11-05 DIAGNOSIS — N63 Unspecified lump in unspecified breast: Secondary | ICD-10-CM | POA: Insufficient documentation

## 2011-11-05 DIAGNOSIS — Z139 Encounter for screening, unspecified: Secondary | ICD-10-CM

## 2011-11-07 ENCOUNTER — Encounter: Payer: Self-pay | Admitting: Family Medicine

## 2011-11-07 ENCOUNTER — Ambulatory Visit (INDEPENDENT_AMBULATORY_CARE_PROVIDER_SITE_OTHER): Payer: Medicare Other | Admitting: Family Medicine

## 2011-11-07 VITALS — BP 180/92 | HR 86 | Resp 16 | Ht 61.0 in | Wt 198.1 lb

## 2011-11-07 DIAGNOSIS — B029 Zoster without complications: Secondary | ICD-10-CM

## 2011-11-07 DIAGNOSIS — E039 Hypothyroidism, unspecified: Secondary | ICD-10-CM

## 2011-11-07 DIAGNOSIS — E785 Hyperlipidemia, unspecified: Secondary | ICD-10-CM

## 2011-11-07 DIAGNOSIS — E669 Obesity, unspecified: Secondary | ICD-10-CM

## 2011-11-07 DIAGNOSIS — E119 Type 2 diabetes mellitus without complications: Secondary | ICD-10-CM

## 2011-11-07 DIAGNOSIS — I10 Essential (primary) hypertension: Secondary | ICD-10-CM

## 2011-11-07 LAB — HEMOGLOBIN A1C: Hgb A1c MFr Bld: 6.4 % — ABNORMAL HIGH (ref <5.7)

## 2011-11-07 NOTE — Patient Instructions (Addendum)
F/u in 3.5 month  Blood pressure is high today  Please call if you need me before  Please try not to worry too much    Please ensure that you get the follow up test on your right breast   hBA1C in 4 month, also fasting lipid, cmp and EGFR and TSH

## 2011-11-07 NOTE — Progress Notes (Signed)
  Subjective:    Patient ID: [name] [name], female    DOB: 1941-05-15, 71 y.o.   MRN: [id number]  HPI  The PT is here for follow up and re-evaluation of chronic medical conditions, medication management and review of any available recent lab and radiology data.  Preventive health is updated, specifically  Cancer screening and Immunization.  Recent mammogram is abnormal , she will need additional imaging Shingles rash has now resolved, and she denies significant burning in the area of the rash. Tests blood sugars daily, fasting sugars are seldom over 120. States she was attacked by a pit bull in a public place recently when going to pay her water bill, no lacerations reported, emotionally shaken, also now the news of her abnormal mammogram   Review of Systems See HPI Denies recent fever or chills. Denies sinus pressure, nasal congestion, ear pain or sore throat. Denies chest congestion, productive cough or wheezing. Denies chest pains, palpitations and leg swelling Denies abdominal pain, nausea, vomiting,diarrhea or constipation.   Denies dysuria, frequency, hesitancy or incontinence. Denies joint pain, swelling and limitation in mobility. Denies headaches, seizures, numbness, or tingling. .         Objective:   Physical Exam Patient alert and oriented and in no cardiopulmonary distress.  HEENT: No facial asymmetry, EOMI, no sinus tenderness,  oropharynx pink and moist.  Neck supple no adenopathy.  Chest: Clear to auscultation bilaterally.  CVS: S1, S2 no murmurs, no S3.  ABD: Soft non tender. Bowel sounds normal.  Ext: No edema  MS: Adequate though reduced  ROM spine, shoulders, hips and knees.  Skin: Intact, rash of shingles on thigh healed and dry  Psych: Good eye contact, normal affect. Memory intact mildly  anxious not  depressed appearing.  CNS: CN 2-12 intact, power, tone and sensation normal throughout.        Assessment & Plan:

## 2011-11-08 ENCOUNTER — Other Ambulatory Visit: Payer: Self-pay | Admitting: Family Medicine

## 2011-11-08 DIAGNOSIS — R928 Other abnormal and inconclusive findings on diagnostic imaging of breast: Secondary | ICD-10-CM

## 2011-11-08 NOTE — Assessment & Plan Note (Signed)
Deteriorated. Patient re-educated about  the importance of commitment to a  minimum of 150 minutes of exercise per week. The importance of healthy food choices with portion control discussed. Encouraged to start a food diary, count calories and to consider  joining a support group. Sample diet sheets offered. Goals set by the patient for the next several months.    

## 2011-11-08 NOTE — Assessment & Plan Note (Signed)
Acute infection has resolved, lesions are healed

## 2011-11-08 NOTE — Assessment & Plan Note (Signed)
Controlled, no change in medication Updated labs at next draw

## 2011-11-08 NOTE — Assessment & Plan Note (Signed)
Controlled, no change in medication  

## 2011-11-08 NOTE — Assessment & Plan Note (Addendum)
Uncontrolled, generally sysstolic is elevated and pt reports raking med as prescribed and being intolerant of any attempt to inc med doses, states she did not take her medication this morning, advised her to do so as soon as possible

## 2011-11-09 ENCOUNTER — Other Ambulatory Visit: Payer: Self-pay

## 2011-11-09 MED ORDER — ROSUVASTATIN CALCIUM 10 MG PO TABS: 10.0000 mg | ORAL_TABLET | Freq: Every day | ORAL | Status: DC

## 2011-11-09 NOTE — Progress Notes (Signed)
Addended by: Eual Fines on: 11/09/2011 09:17 AM   Modules accepted: Orders

## 2011-11-13 ENCOUNTER — Ambulatory Visit: Payer: Medicare Other | Admitting: Family Medicine

## 2011-11-14 ENCOUNTER — Ambulatory Visit (HOSPITAL_COMMUNITY)
Admission: RE | Admit: 2011-11-14 | Discharge: 2011-11-14 | Disposition: A | Discharge status: Home / Self Care | Payer: Medicare Other | Source: Ambulatory Visit | Attending: Family Medicine | Admitting: Family Medicine

## 2011-11-14 ENCOUNTER — Other Ambulatory Visit: Payer: Self-pay | Admitting: Family Medicine

## 2011-11-14 DIAGNOSIS — R928 Other abnormal and inconclusive findings on diagnostic imaging of breast: Secondary | ICD-10-CM

## 2011-11-14 DIAGNOSIS — N63 Unspecified lump in unspecified breast: Secondary | ICD-10-CM | POA: Insufficient documentation

## 2011-11-14 DIAGNOSIS — N6310 Unspecified lump in the right breast, unspecified quadrant: Secondary | ICD-10-CM

## 2011-11-21 ENCOUNTER — Ambulatory Visit (HOSPITAL_COMMUNITY)
Admission: RE | Admit: 2011-11-21 | Discharge: 2011-11-21 | Disposition: A | Discharge status: Home / Self Care | Payer: Medicare Other | Source: Ambulatory Visit | Attending: Family Medicine | Admitting: Family Medicine

## 2011-11-21 ENCOUNTER — Other Ambulatory Visit: Payer: Self-pay | Admitting: Family Medicine

## 2011-11-21 DIAGNOSIS — R928 Other abnormal and inconclusive findings on diagnostic imaging of breast: Secondary | ICD-10-CM

## 2011-11-21 DIAGNOSIS — N63 Unspecified lump in unspecified breast: Secondary | ICD-10-CM

## 2011-11-21 DIAGNOSIS — N6310 Unspecified lump in the right breast, unspecified quadrant: Secondary | ICD-10-CM

## 2011-11-21 NOTE — Discharge Instructions (Signed)
Breast Biopsy WHY YOU NEED A BIOPSY Your caregiver has recommended that you have a breast tissue sample taken (biopsy). This is done to be certain that the lump or abnormality found in your breast is not cancerous (malignant). During a biopsy, a small piece of tissue is removed, so it can be examined under a microscope by a specialist (pathologist) who looks at tissues and cells and diagnoses abnormalities in them. Most lumps (tumors) or abnormalities, on or in the breast, are not cancerous (benign). However, biopsies are taken when your caregiver cannot be absolutely certain of what is wrong only from doing a physical exam, mammogram (breast X-ray), or other studies. A breast biopsy can tell you whether nothing more needs to be done, or you need more surgery or another type of treatment. A biopsy is done when there is:  Any undiagnosed breast mass.   Nipple abnormalities, dimpling, crusting, or ulcerations.   Calcium deposits (calcifications) or abnormalities seen on your mammogram, ultrasound, or MRI.   Suspicious changes in the breast (thickening, asymmetry) seen on mammogram.   Abnormal discharge from the nipple, especially blood.   Redness, swelling, and pain of the breast.  HOW A BIOPSY IS PERFORMED A biopsy is often performed on an outpatient basis (you go home the same day). This can be done in a hospital, clinic, or surgical center. Tissue samples (biopsies) are often done under local anesthesia (area is numbed). Sometimes general anesthetics are required, in which case you sleep through the procedure. Biopsies may remove the entire lump, a small piece of the lump, or a small sliver of tissue removed by needle. TYPES OF BREAST BIOPSY  Fine needle aspiration. A thin needle is placed through the skin, to the lump or cyst, and cells are removed.   Core needle biopsy. A large needle with a special tip is placed through the skin, to the abnormality, and a piece of tissue is removed.    Stereotactic biopsy. A core needle with a special X-ray is used, to direct the needle to the lump or abnormal area, which is difficult to feel or cannot be felt.   Vacuum-assisted biopsy. A hollow probe and a gentle vacuum remove a sample of tissue.   Ultrasound guided core needle biopsy. You lie on your stomach, with your breast through an opening, and a high frequency ultrasound helps guide the needle to the area of the abnormality.   Open biopsy. An incision is made in the breast, and a piece of the lump or the whole lump is removed.  LET YOUR CAREGIVER KNOW ABOUT:  Allergies.   Medicines taken, including herbs, eye drops, over-the-counter medicines, and creams.   Use of steroids (by mouth or creams).   Previous problems with anesthetics or Novocaine.   If you are taking aspirin or blood thinners.   Possibility of pregnancy, if this applies.   History of blood clots (thrombophlebitis).   History of bleeding or blood problems.   Previous surgery.   Other health problems.  RISKS AND COMPLICATIONS   Bleeding.   Infection.   Allergy to medicines.   Bruising and swelling of the breast.   Alteration in the shape of the breast.   Not finding the lump or abnormality.   Needing more surgery.  BEFORE THE PROCEDURE  You should arrive 60 minutes prior to your procedure or as directed.   Check-in at the admissions desk, to fill out necessary forms, if you are not preregistered.   There will be consent forms  to sign, prior to the procedure.   There is a waiting area for your family, while you are having your biopsy.   Try to have someone with you, to drive you home.   Do not smoke for 2 weeks before the surgery.   Let your caregiver know if you develop a cold or an infection.   Do not drink alcohol for at least 24 hours before surgery.   Wear a good support bra to the surgery.  AFTER THE PROCEDURE  After surgery, you will be taken to the recovery area, where a  nurse will watch and check your progress. Once you are awake, stable, and taking fluids well, if there are no other problems, you will be allowed to go home.   Ice packs applied to your operative site may help with discomfort and keep the swelling down.   You may resume normal diet and activities as directed. Avoid strenuous activities affecting the arm on the side of the biopsy, such as tennis, swimming, heavy lifting (more than 10 pounds) or pulling.   Bruising in the breast is normal following this procedure.   Wearing a support bra, even to bed, may be more comfortable. The bra will also help keep the dressing on.   Change dressings as directed.   Your doctor may apply a pressure dressing on your breast for 24 to 48 hours.   Only take over-the-counter or prescription medicines for pain, discomfort, or fever as directed by your caregiver.   Do not take aspirin, because it can cause bleeding.  HOME CARE INSTRUCTIONS   You may resume your usual diet.   Have someone drive you home after the surgery.   Do not do any exercise, driving, lifting or general activities without your caregiver's permission.   Take medicines and over-the-counter medicines, as ordered by your caregiver.   Keep your postoperative appointments as recommended.   Do not drink alcohol while taking pain medicine.  Finding out the results of your test Not all test results are available during your visit. If your test results are not back during the visit, make an appointment with your caregiver to find out the results. Do not assume everything is normal if you have not heard from your caregiver or the medical facility. It is important for you to follow up on all of your test results.  SEEK MEDICAL CARE IF:   You notice redness, swelling, or increasing pain in the wound.   You notice a bad smell coming from the wound or dressing.   You develop a rash.   You need stronger pain medicine.   You are having an  allergic reaction or problems with your medicines.  SEEK IMMEDIATE MEDICAL CARE IF:   You have difficulty breathing.   You have a fever.   There is increased bleeding (more than a small spot) from the wound.   Pus is coming from the wound.   The wound is breaking open.  Document Released: 06/18/2005 Document Revised: 06/07/2011 Document Reviewed: 05/06/2009 [employer] Patient Information 2012 [employer], LLCBreast Biopsy WHY YOU NEED A BIOPSY Your caregiver has recommended that you have a breast tissue sample taken (biopsy). This is done to be certain that the lump or abnormality found in your breast is not cancerous (malignant). During a biopsy, a small piece of tissue is removed, so it can be examined under a microscope by a specialist (pathologist) who looks at tissues and cells and diagnoses abnormalities in them. Most lumps (tumors) or  abnormalities, on or in the breast, are not cancerous (benign). However, biopsies are taken when your caregiver cannot be absolutely certain of what is wrong only from doing a physical exam, mammogram (breast X-ray), or other studies. A breast biopsy can tell you whether nothing more needs to be done, or you need more surgery or another type of treatment. A biopsy is done when there is:  Any undiagnosed breast mass.   Nipple abnormalities, dimpling, crusting, or ulcerations.   Calcium deposits (calcifications) or abnormalities seen on your mammogram, ultrasound, or MRI.   Suspicious changes in the breast (thickening, asymmetry) seen on mammogram.   Abnormal discharge from the nipple, especially blood.   Redness, swelling, and pain of the breast.  HOW A BIOPSY IS PERFORMED A biopsy is often performed on an outpatient basis (you go home the same day). This can be done in a hospital, clinic, or surgical center. Tissue samples (biopsies) are often done under local anesthesia (area is numbed). Sometimes general anesthetics are required, in which case you  sleep through the procedure. Biopsies may remove the entire lump, a small piece of the lump, or a small sliver of tissue removed by needle. TYPES OF BREAST BIOPSY  Fine needle aspiration. A thin needle is placed through the skin, to the lump or cyst, and cells are removed.   Core needle biopsy. A large needle with a special tip is placed through the skin, to the abnormality, and a piece of tissue is removed.   Stereotactic biopsy. A core needle with a special X-ray is used, to direct the needle to the lump or abnormal area, which is difficult to feel or cannot be felt.   Vacuum-assisted biopsy. A hollow probe and a gentle vacuum remove a sample of tissue.   Ultrasound guided core needle biopsy. You lie on your stomach, with your breast through an opening, and a high frequency ultrasound helps guide the needle to the area of the abnormality.   Open biopsy. An incision is made in the breast, and a piece of the lump or the whole lump is removed.  LET YOUR CAREGIVER KNOW ABOUT:  Allergies.   Medicines taken, including herbs, eye drops, over-the-counter medicines, and creams.   Use of steroids (by mouth or creams).   Previous problems with anesthetics or Novocaine.   If you are taking aspirin or blood thinners.   Possibility of pregnancy, if this applies.   History of blood clots (thrombophlebitis).   History of bleeding or blood problems.   Previous surgery.   Other health problems.  RISKS AND COMPLICATIONS   Bleeding.   Infection.   Allergy to medicines.   Bruising and swelling of the breast.   Alteration in the shape of the breast.   Not finding the lump or abnormality.   Needing more surgery.  BEFORE THE PROCEDURE  You should arrive 60 minutes prior to your procedure or as directed.   Check-in at the admissions desk, to fill out necessary forms, if you are not preregistered.   There will be consent forms to sign, prior to the procedure.   There is a waiting  area for your family, while you are having your biopsy.   Try to have someone with you, to drive you home.   Do not smoke for 2 weeks before the surgery.   Let your caregiver know if you develop a cold or an infection.   Do not drink alcohol for at least 24 hours before surgery.   Wear  a good support bra to the surgery.  AFTER THE PROCEDURE  After surgery, you will be taken to the recovery area, where a nurse will watch and check your progress. Once you are awake, stable, and taking fluids well, if there are no other problems, you will be allowed to go home.   Ice packs applied to your operative site may help with discomfort and keep the swelling down.   You may resume normal diet and activities as directed. Avoid strenuous activities affecting the arm on the side of the biopsy, such as tennis, swimming, heavy lifting (more than 10 pounds) or pulling.   Bruising in the breast is normal following this procedure.   Wearing a support bra, even to bed, may be more comfortable. The bra will also help keep the dressing on.   Change dressings as directed.   Your doctor may apply a pressure dressing on your breast for 24 to 48 hours.   Only take over-the-counter or prescription medicines for pain, discomfort, or fever as directed by your caregiver.   Do not take aspirin, because it can cause bleeding.  HOME CARE INSTRUCTIONS   You may resume your usual diet.   Have someone drive you home after the surgery.   Do not do any exercise, driving, lifting or general activities without your caregiver's permission.   Take medicines and over-the-counter medicines, as ordered by your caregiver.   Keep your postoperative appointments as recommended.   Do not drink alcohol while taking pain medicine.  Finding out the results of your test Not all test results are available during your visit. If your test results are not back during the visit, make an appointment with your caregiver to find out  the results. Do not assume everything is normal if you have not heard from your caregiver or the medical facility. It is important for you to follow up on all of your test results.  SEEK MEDICAL CARE IF:   You notice redness, swelling, or increasing pain in the wound.   You notice a bad smell coming from the wound or dressing.   You develop a rash.   You need stronger pain medicine.   You are having an allergic reaction or problems with your medicines.  SEEK IMMEDIATE MEDICAL CARE IF:   You have difficulty breathing.   You have a fever.   There is increased bleeding (more than a small spot) from the wound.   Pus is coming from the wound.   The wound is breaking open.  Document Released: 06/18/2005 Document Revised: 06/07/2011 Document Reviewed: 05/06/2009 [facility] Patient Information 2012 [email or web address] Kitchen

## 2011-11-27 ENCOUNTER — Other Ambulatory Visit: Payer: Self-pay | Admitting: Diagnostic Radiology

## 2011-11-27 ENCOUNTER — Ambulatory Visit
Admission: RE | Admit: 2011-11-27 | Discharge: 2011-11-27 | Disposition: A | Discharge status: Home / Self Care | Payer: Medicare Other | Source: Ambulatory Visit | Attending: Family Medicine | Admitting: Family Medicine

## 2011-11-27 DIAGNOSIS — N6310 Unspecified lump in the right breast, unspecified quadrant: Secondary | ICD-10-CM

## 2012-01-04 ENCOUNTER — Other Ambulatory Visit: Payer: Self-pay | Admitting: Family Medicine

## 2012-02-02 ENCOUNTER — Other Ambulatory Visit: Payer: Self-pay | Admitting: Family Medicine

## 2012-03-05 LAB — COMPLETE METABOLIC PANEL WITH GFR
ALT: 13 U/L (ref 0–35)
CO2: 27 mEq/L (ref 19–32)
Calcium: 10.2 mg/dL (ref 8.4–10.5)
Chloride: 107 mEq/L (ref 96–112)
GFR, Est African American: 70 mL/min
Sodium: 141 mEq/L (ref 135–145)
Total Protein: 7.4 g/dL (ref 6.0–8.3)

## 2012-03-05 LAB — HEMOGLOBIN A1C: Mean Plasma Glucose: 128 mg/dL — ABNORMAL HIGH (ref <117)

## 2012-03-11 ENCOUNTER — Encounter: Payer: Self-pay | Admitting: Family Medicine

## 2012-03-11 ENCOUNTER — Ambulatory Visit (INDEPENDENT_AMBULATORY_CARE_PROVIDER_SITE_OTHER): Payer: Medicare Other | Admitting: Family Medicine

## 2012-03-11 VITALS — BP 170/70 | HR 62 | Resp 16 | Ht 61.0 in | Wt 201.0 lb

## 2012-03-11 DIAGNOSIS — J309 Allergic rhinitis, unspecified: Secondary | ICD-10-CM

## 2012-03-11 DIAGNOSIS — E039 Hypothyroidism, unspecified: Secondary | ICD-10-CM

## 2012-03-11 DIAGNOSIS — Z23 Encounter for immunization: Secondary | ICD-10-CM

## 2012-03-11 DIAGNOSIS — E119 Type 2 diabetes mellitus without complications: Secondary | ICD-10-CM

## 2012-03-11 DIAGNOSIS — M899 Disorder of bone, unspecified: Secondary | ICD-10-CM

## 2012-03-11 DIAGNOSIS — R5383 Other fatigue: Secondary | ICD-10-CM

## 2012-03-11 DIAGNOSIS — I10 Essential (primary) hypertension: Secondary | ICD-10-CM

## 2012-03-11 DIAGNOSIS — R5381 Other malaise: Secondary | ICD-10-CM

## 2012-03-11 DIAGNOSIS — E669 Obesity, unspecified: Secondary | ICD-10-CM

## 2012-03-11 DIAGNOSIS — M949 Disorder of cartilage, unspecified: Secondary | ICD-10-CM

## 2012-03-11 DIAGNOSIS — E785 Hyperlipidemia, unspecified: Secondary | ICD-10-CM

## 2012-03-11 MED ORDER — THYROID 30 MG PO TABS: ORAL_TABLET | ORAL | Status: DC

## 2012-03-11 MED ORDER — FEXOFENADINE-PSEUDOEPHED ER 180-240 MG PO TB24: 1.0000 | ORAL_TABLET | Freq: Every day | ORAL | Status: DC

## 2012-03-11 MED ORDER — METFORMIN HCL 500 MG PO TABS: ORAL_TABLET | ORAL | Status: DC

## 2012-03-11 MED ORDER — AMLODIPINE BESYLATE 5 MG PO TABS: 5.0000 mg | ORAL_TABLET | Freq: Every day | ORAL | Status: DC

## 2012-03-11 MED ORDER — ROSUVASTATIN CALCIUM 10 MG PO TABS: 10.0000 mg | ORAL_TABLET | Freq: Every day | ORAL | Status: DC

## 2012-03-11 MED ORDER — METOPROLOL SUCCINATE ER 200 MG PO TB24: ORAL_TABLET | ORAL | Status: DC

## 2012-03-11 MED ORDER — VALSARTAN-HYDROCHLOROTHIAZIDE 320-25 MG PO TABS: 1.0000 | ORAL_TABLET | Freq: Every day | ORAL | Status: DC

## 2012-03-11 NOTE — Progress Notes (Signed)
  Subjective:    Patient ID: [name] [name], female    DOB: 1941-05-19, 71 y.o.   MRN: [id number]  HPI The PT is here for follow up and re-evaluation of chronic medical conditions, medication management and review of any available recent lab and radiology data.  Preventive health is updated, specifically  Cancer screening and Immunization.   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 Right hip pain fleetingly 3 days ago, also right knee pain intermittently  Zyrtec is not as effective as allegra D and wants those The PT denies any adverse reactions to current medications since the last visit.  There are no new concerns.  There are no specific complaints       Review of Systems See HPI Denies recent fever or chills. Denies sinus pressure, nasal congestion, ear pain or sore throat.Increased and uncontrolled allergy symptoms Denies chest congestion, productive cough or wheezing. Denies chest pains, palpitations and leg swelling Denies abdominal pain, nausea, vomiting,diarrhea or constipation.   Denies dysuria, frequency, hesitancy or incontinence.  Denies headaches, seizures, numbness, or tingling. Denies depression, anxiety or insomnia. Denies skin break down or rash.        Objective:   Physical Exam Patient alert and oriented and in no cardiopulmonary distress.  HEENT: No facial asymmetry, EOMI, no sinus tenderness,  oropharynx pink and moist.  Neck supple no adenopathy.  Chest: Clear to auscultation bilaterally.  CVS: S1, S2 no murmurs, no S3.  ABD: Soft non tender. Bowel sounds normal.  Ext: No edema  MS: Adequate ROM spine, shoulders, hips and knees.  Skin: Intact, no ulcerations or rash noted.  Psych: Good eye contact, normal affect. Memory intact not anxious or depressed appearing.  CNS: CN 2-12 intact, power, tone and  sensation normal throughout. Diabetic Foot Check:  Appearance - no lesions, ulcers or calluses Skin - no unusual pallor or redness Sensation - grossly intact to light touch Monofilament testing -  Right - Great toe, medial, central, lateral ball and posterior foot intact Left - Great toe, medial, central, lateral ball and posterior foot intact Pulses Left - Dorsalis Pedis and Posterior Tibia normal Right - Dorsalis Pedis and Posterior Tibia normal        Assessment & Plan:

## 2012-03-11 NOTE — Patient Instructions (Addendum)
F/u in 4 month, call if you need me before  All the best with eye surgery.   Flu vacc today and you will get a script for the shingles vaaccine.  Please work on reducing caloric intake and weight loss.  Microalb, cbc, fasting lipid, cmp and EGFR , HBA1C, TSh and vit D in January before follow up

## 2012-03-19 ENCOUNTER — Other Ambulatory Visit: Payer: Self-pay | Admitting: *Deleted

## 2012-03-19 DIAGNOSIS — I6529 Occlusion and stenosis of unspecified carotid artery: Secondary | ICD-10-CM

## 2012-03-20 ENCOUNTER — Encounter (INDEPENDENT_AMBULATORY_CARE_PROVIDER_SITE_OTHER): Payer: Medicare Other

## 2012-03-20 DIAGNOSIS — I6529 Occlusion and stenosis of unspecified carotid artery: Secondary | ICD-10-CM

## 2012-03-24 NOTE — Assessment & Plan Note (Signed)
unchanged Patient re-educated about  the importance of commitment to a  minimum of 150 minutes of exercise per week. The importance of healthy food choices with portion control discussed. Encouraged to start a food diary, count calories and to consider  joining a support group. Sample diet sheets offered. Goals set by the patient for the next several months.    

## 2012-03-24 NOTE — Assessment & Plan Note (Signed)
Uncontrolled med changed per pt request

## 2012-03-24 NOTE — Assessment & Plan Note (Signed)
Controlled, no change in medication  

## 2012-03-24 NOTE — Assessment & Plan Note (Signed)
Improved control, no med change  Patient advised to reduce carb and sweets, commit to regular physical activity, take meds as prescribed, test blood as directed, and attempt to lose weight, to improve blood sugar control.

## 2012-03-24 NOTE — Assessment & Plan Note (Signed)
Hyperlipidemia:Low fat diet discussed and encouraged.  Updated labs needed 

## 2012-03-24 NOTE — Assessment & Plan Note (Signed)
Uncontrolled, pt intolerant of additional blood pressure medications, has been attempted multiple times, will continue current meds only DASH diet and commitment to daily physical activity for a minimum of 30 minutes discussed and encouraged, as a part of hypertension management. The importance of attaining a healthy weight is also discussed.

## 2012-03-28 ENCOUNTER — Telehealth: Payer: Self-pay | Admitting: *Deleted

## 2012-03-28 NOTE — Telephone Encounter (Signed)
Notes Recorded by Laurine Blazer, LPN on [id number] at [id number] AM Patient notified.

## 2012-03-28 NOTE — Telephone Encounter (Signed)
Message copied by Laurine Blazer on Fri Mar 28, 2012  9:30 AM ------      Message from: Marquette, Yorklyn D      Created: Fri Mar 28, 2012  8:33 AM       The Doppler is stable.

## 2012-07-10 ENCOUNTER — Encounter: Payer: Self-pay | Admitting: Family Medicine

## 2012-07-10 ENCOUNTER — Ambulatory Visit (INDEPENDENT_AMBULATORY_CARE_PROVIDER_SITE_OTHER): Payer: Medicare Other | Admitting: Family Medicine

## 2012-07-10 VITALS — BP 150/72 | HR 60 | Resp 18 | Ht 61.0 in | Wt 199.1 lb

## 2012-07-10 DIAGNOSIS — E785 Hyperlipidemia, unspecified: Secondary | ICD-10-CM

## 2012-07-10 DIAGNOSIS — R7309 Other abnormal glucose: Secondary | ICD-10-CM

## 2012-07-10 DIAGNOSIS — I10 Essential (primary) hypertension: Secondary | ICD-10-CM

## 2012-07-10 DIAGNOSIS — R7301 Impaired fasting glucose: Secondary | ICD-10-CM

## 2012-07-10 DIAGNOSIS — J42 Unspecified chronic bronchitis: Secondary | ICD-10-CM

## 2012-07-10 DIAGNOSIS — R7302 Impaired glucose tolerance (oral): Secondary | ICD-10-CM

## 2012-07-10 DIAGNOSIS — E039 Hypothyroidism, unspecified: Secondary | ICD-10-CM

## 2012-07-10 DIAGNOSIS — E669 Obesity, unspecified: Secondary | ICD-10-CM

## 2012-07-10 MED ORDER — AMLODIPINE BESYLATE 2.5 MG PO TABS: 2.5000 mg | ORAL_TABLET | Freq: Every day | ORAL | Status: DC

## 2012-07-10 MED ORDER — THYROID 30 MG PO TABS: ORAL_TABLET | ORAL | Status: DC

## 2012-07-10 NOTE — Progress Notes (Signed)
  Subjective:    Patient ID: [name] [name], female    DOB: 1941/06/24, 72 y.o.   MRN: [id number]  HPI  The PT is here for follow up and re-evaluation of chronic medical conditions, medication management and review of any available recent lab and radiology data.  Preventive health is updated, specifically  Cancer screening and Immunization.   Questions or concerns regarding consultations or procedures which the PT has had in the interim are  addressed. The PT denies any adverse reactions to current medications since the last visit.  There are no new concerns.  There are no specific complaints      Review of Systems See HPI Denies recent fever or chills. Denies sinus pressure, nasal congestion, ear pain or sore throat. Denies chest congestion, productive cough or wheezing. Denies chest pains, palpitations and leg swelling Denies abdominal pain, nausea, vomiting,diarrhea or constipation.   Denies dysuria, frequency, hesitancy or incontinence. Denies joint pain, swelling and limitation in mobility. Denies headaches, seizures, numbness, or tingling. Denies depression, anxiety or insomnia. Denies skin break down or rash.        Objective:   Physical Exam Patient alert and oriented and in no cardiopulmonary distress.  HEENT: No facial asymmetry, EOMI, no sinus tenderness,  oropharynx pink and moist.  Neck supple no adenopathy.  Chest: Clear to auscultation bilaterally.  CVS: S1, S2 no murmurs, no S3.  ABD: Soft non tender. Bowel sounds normal.  Ext: No edema  MS: Adequate ROM spine, shoulders, hips and knees.  Skin: Intact, no ulcerations or rash noted.  Psych: Good eye contact, normal affect. Memory intact not anxious or depressed appearing.  CNS: CN 2-12 intact, power, tone and sensation normal throughout.       Assessment & Plan:

## 2012-07-10 NOTE — Patient Instructions (Addendum)
Annual wellness in 4.5  Month, call if you need me before  Please start to commit to daily exercise for approx 30 minutes every day.  hBA1c in 4 month  Weight loss goal of 5 to 10 pounds in the next 4.5 month  Apply topical antibiotic to the burn on your left index finger for the next 3 days Letter for garbage removal  Will be provided  Stop metformin  You need to start calcium with D1200mg /1000IU daily for bones, ok to finish the vit D tablets you currently have and take with that  Increase the thyroid medicine to tWO tablets every day  TSH in 4.5 and chem 53month   Blood pressure is high and kidneys are spilling too much protein, amlodipine 2.5 mg one daily is being ADDED to the medications you are already taking for your blood pressure

## 2012-07-22 ENCOUNTER — Encounter: Payer: Self-pay | Admitting: Family Medicine

## 2012-07-24 ENCOUNTER — Ambulatory Visit: Payer: Medicare Other

## 2012-07-25 ENCOUNTER — Encounter: Payer: Self-pay | Admitting: Cardiology

## 2012-07-27 DIAGNOSIS — J42 Unspecified chronic bronchitis: Secondary | ICD-10-CM | POA: Insufficient documentation

## 2012-07-27 NOTE — Assessment & Plan Note (Signed)
Unchanged. Patient re-educated about  the importance of commitment to a  minimum of 150 minutes of exercise per week. The importance of healthy food choices with portion control discussed. Encouraged to start a food diary, count calories and to consider  joining a support group. Sample diet sheets offered. Goals set by the patient for the next several months.    

## 2012-07-27 NOTE — Assessment & Plan Note (Signed)
Controlled, no change in medication Updated lab next visit

## 2012-07-27 NOTE — Assessment & Plan Note (Signed)
Stable on chronic meds no change

## 2012-07-27 NOTE — Assessment & Plan Note (Signed)
Improved, dietary management only, discontinue metformin

## 2012-07-27 NOTE — Assessment & Plan Note (Signed)
Controlled, no change in medication Hyperlipidemia:Low fat diet discussed and encouraged.  \ 

## 2012-07-27 NOTE — Assessment & Plan Note (Addendum)
Improved, though still not at goal, this is the best I have seen in months. Amlodipine 2.5mg  added if pt able to tolerate DASH diet and commitment to daily physical activity for a minimum of 30 minutes discussed and encouraged, as a part of hypertension management. The importance of attaining a healthy weight is also discussed.

## 2012-07-28 ENCOUNTER — Encounter: Payer: Self-pay | Admitting: Cardiology

## 2012-07-28 ENCOUNTER — Ambulatory Visit (INDEPENDENT_AMBULATORY_CARE_PROVIDER_SITE_OTHER): Payer: Medicare Other | Admitting: Cardiology

## 2012-07-28 VITALS — BP 182/79 | HR 56 | Ht 61.0 in | Wt 195.1 lb

## 2012-07-28 DIAGNOSIS — R0989 Other specified symptoms and signs involving the circulatory and respiratory systems: Secondary | ICD-10-CM

## 2012-07-28 DIAGNOSIS — I2510 Atherosclerotic heart disease of native coronary artery without angina pectoris: Secondary | ICD-10-CM

## 2012-07-28 DIAGNOSIS — I10 Essential (primary) hypertension: Secondary | ICD-10-CM

## 2012-07-28 DIAGNOSIS — R9430 Abnormal result of cardiovascular function study, unspecified: Secondary | ICD-10-CM

## 2012-07-28 NOTE — Progress Notes (Signed)
HPI  Patient is seen to followup coronary artery disease. The patient has carotid disease. It is being followed carefully and her most recent Doppler showed that her disease was stable. She underwent CABG in 2005. Nuclear in 2009 revealed no marked ischemia. Ejection fraction was normal. I saw her last January, 2013. She actually is doing quite well.  Allergies  Allergen Reactions  . Clonidine Derivatives Other (See Comments)    Zombie BP dropped    Current Outpatient Prescriptions  Medication Sig Dispense Refill  . albuterol (PROVENTIL HFA;VENTOLIN HFA) 108 (90 BASE) MCG/ACT inhaler Inhale 2 puffs into the lungs every 6 (six) hours as needed for wheezing.  18 g  0  . amLODipine (NORVASC) 2.5 MG tablet Take 1 tablet (2.5 mg total) by mouth daily.  30 tablet  11  . amLODipine (NORVASC) 5 MG tablet Take 1 tablet (5 mg total) by mouth daily.  90 tablet  1  . aspirin (ASPIRIN LOW DOSE) 81 MG EC tablet Take 81 mg by mouth daily. Take one tablet by mouth once a day       . Calcium-Vitamin D 600-200 MG-UNIT per tablet Take 1 tablet by mouth daily.      . cetirizine (ZYRTEC) 10 MG tablet Take 10 mg by mouth daily.      . Cholecalciferol (VITAMIN D3) 1000 UNITS capsule Take 1,000 Units by mouth daily. Take 1 tablet by mouth once a day       . fish oil-omega-3 fatty acids 1000 MG capsule Take 2 g by mouth daily.      . fluticasone (FLONASE) 50 MCG/ACT nasal spray Place 2 sprays into the nose daily.      Marland Kitchen ipratropium-albuterol (DUONEB) 0.5-2.5 (3) MG/3ML SOLN Take 3 mLs by nebulization every 8 (eight) hours as needed.  270 mL  0  . latanoprost (XALATAN) 0.005 % ophthalmic solution Place 1 drop into both eyes at bedtime. One drop each eye at bedtime       . metoprolol (TOPROL XL) 200 MG 24 hr tablet TAKE 1 TABLET ONCE DAILY  90 tablet  1  . Multiple Vitamins-Minerals (THERATRUM COMPLETE 50 PLUS PO) Take by mouth. Take one tablet by mouth daily        . rosuvastatin (CRESTOR) 10 MG tablet Take 1  tablet (10 mg total) by mouth daily. One tablet by mouth once daily  90 tablet  1  . thyroid (ARMOUR THYROID) 30 MG tablet Two tablets every day. Dose increase effective 07/10/2012  60 tablet  5  . valsartan-hydrochlorothiazide (DIOVAN HCT) 320-25 MG per tablet Take 1 tablet by mouth daily. One table by mouth once daily  90 tablet  1    History   Social History  . Marital Status: Married    Spouse Name: N/A    Number of Children: 1  . Years of Education: N/A   Occupational History  . retired     Social History Main Topics  . Smoking status: Former Smoker -- 0.3 packs/day for 40 years    Types: Cigarettes    Quit date: 07/03/2003  . Smokeless tobacco: Never Used  . Alcohol Use: No  . Drug Use: No  . Sexually Active: Not on file   Other Topics Concern  . Not on file   Social History Narrative  . No narrative on file    Family History  Problem Relation Age of Onset  . Aneurysm Mother   . Hypertension Mother   . Coronary artery disease  Mother   . Coronary artery disease Father   . Diabetes Sister   . Hypertension Sister   . Coronary artery disease Sister   . Diabetes Son     Past Medical History  Diagnosis Date  . Obesity   . 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 Hyperlipidemia   . Hypertension   . Glaucoma(365) dx 2010    had laser to both eyes in 2010  . Hypothyroidism   . Renal insufficiency, mild   . Carotid artery disease 11/2008    doppler 11/2008.Marland KitchenMarland Kitchen50% RICA / doppler 03/2010.Marland KitchenMarland KitchenMarland Kitchen60-79% RICA ...less than [id number] LICA.Marland KitchenMarland KitchenPlan   . Hx of CABG     2005  . Ejection fraction     EF 70%, nuclear, July, 2009     Past Surgical History  Procedure Date  . Coronary artery bypass graft x4 (10/05)     Patient Active Problem List  Diagnosis  . HYPOTHYROIDISM  . IGT (impaired glucose tolerance)  . HYPERLIPIDEMIA  . OBESITY  . HYPERTENSION  . ALLERGIC RHINITIS CAUSE UNSPECIFIED  . RENAL  INSUFFICIENCY  . FATIGUE  . CORONARY ARTERY BYPASS GRAFT, HX OF  . CKD (chronic kidney disease)  . CAD (coronary artery disease)  . Carotid artery disease  . Ejection fraction  . Vaginitis  . Zoster  . Chronic bronchitis    ROS   Patient denies fever, chills, headache, sweats, rash, change in vision, change in hearing, chest pain, cough, nausea vomiting, urinary symptoms. All other systems are reviewed and are negative.  PHYSICAL EXAM  Patient is oriented to person time and place. Affect is normal. Lungs are clear. Respiratory effort is nonlabored. Cardiac exam reveals S1 and S2. There no clicks or significant murmurs. The abdomen is soft. There is no peripheral edema.  Filed Vitals:   07/28/12 1438  BP: 182/79  Pulse: 56  Height: 5\' 1"  (1.549 m)  Weight: 195 lb 1.9 oz (88.506 kg)   EKG is done today and reviewed by me. There is very mild sinus bradycardia. The patient has diffuse nonspecific ST-T wave changes with T wave inversions. This is unchanged from the tracing of January, 2013.  ASSESSMENT & PLAN

## 2012-07-28 NOTE — Assessment & Plan Note (Signed)
The patient's blood pressure is elevated today. She takes and at home and checks in the pharmacy on a regular basis and it is controlled. She also sees her primary physician regularly. I've chosen not to change her medicines.

## 2012-07-28 NOTE — Assessment & Plan Note (Signed)
We know that the patient has normal systolic left ventricular function by her last echo. No further workup is needed.

## 2012-07-28 NOTE — Assessment & Plan Note (Signed)
Coronary disease is stable. No change in therapy. 

## 2012-07-28 NOTE — Patient Instructions (Addendum)
Continue all current medications. Your physician wants you to follow up in:  1 year.  You will receive a reminder letter in the mail one-two months in advance.  If you don't receive a letter, please call our office to schedule the follow up appointment   

## 2012-08-25 ENCOUNTER — Telehealth: Payer: Self-pay | Admitting: Family Medicine

## 2012-08-25 ENCOUNTER — Other Ambulatory Visit: Payer: Self-pay | Admitting: Family Medicine

## 2012-08-26 ENCOUNTER — Other Ambulatory Visit: Payer: Self-pay | Admitting: Family Medicine

## 2012-08-26 ENCOUNTER — Other Ambulatory Visit: Payer: Self-pay

## 2012-08-26 DIAGNOSIS — I10 Essential (primary) hypertension: Secondary | ICD-10-CM

## 2012-08-26 MED ORDER — FUROSEMIDE 20 MG PO TABS: 20.0000 mg | ORAL_TABLET | Freq: Every day | ORAL | Status: DC

## 2012-08-26 MED ORDER — POTASSIUM CHLORIDE CRYS ER 20 MEQ PO TBCR: 20.0000 meq | EXTENDED_RELEASE_TABLET | Freq: Every day | ORAL | Status: DC

## 2012-08-26 NOTE — Telephone Encounter (Signed)
Advise try lasix one daily wit potassium one daily in addition to med she is taking, get non fasting chem 7 in 5 weeks, and needs Ov with me at that time Pls send historically entered meds after you spk with her

## 2012-08-26 NOTE — Telephone Encounter (Signed)
PATIENT AWARE

## 2012-08-26 NOTE — Telephone Encounter (Signed)
Error/disregard

## 2012-08-26 NOTE — Telephone Encounter (Signed)
Has been having swelling in her ankles ever since she increased the norvasc. Her knee on her right leg feels tight down to her calf and "draws". Has not been using excess salt either. Wants to know what you want her to do

## 2012-09-15 ENCOUNTER — Other Ambulatory Visit: Payer: Self-pay | Admitting: Family Medicine

## 2012-09-29 ENCOUNTER — Other Ambulatory Visit: Payer: Self-pay | Admitting: *Deleted

## 2012-09-29 DIAGNOSIS — I779 Disorder of arteries and arterioles, unspecified: Secondary | ICD-10-CM

## 2012-09-30 LAB — BASIC METABOLIC PANEL
CO2: 27 mEq/L (ref 19–32)
Chloride: 106 mEq/L (ref 96–112)
Glucose, Bld: 89 mg/dL (ref 70–99)
Sodium: 142 mEq/L (ref 135–145)

## 2012-09-30 LAB — HEMOGLOBIN A1C: Mean Plasma Glucose: 137 mg/dL — ABNORMAL HIGH (ref <117)

## 2012-10-06 ENCOUNTER — Ambulatory Visit (INDEPENDENT_AMBULATORY_CARE_PROVIDER_SITE_OTHER): Payer: Medicare Other | Admitting: Family Medicine

## 2012-10-06 ENCOUNTER — Encounter: Payer: Self-pay | Admitting: Family Medicine

## 2012-10-06 ENCOUNTER — Other Ambulatory Visit: Payer: Self-pay | Admitting: Family Medicine

## 2012-10-06 VITALS — BP 158/70 | HR 54 | Resp 16 | Ht 61.0 in | Wt 201.0 lb

## 2012-10-06 DIAGNOSIS — R7302 Impaired glucose tolerance (oral): Secondary | ICD-10-CM

## 2012-10-06 DIAGNOSIS — R7309 Other abnormal glucose: Secondary | ICD-10-CM

## 2012-10-06 DIAGNOSIS — I10 Essential (primary) hypertension: Secondary | ICD-10-CM

## 2012-10-06 DIAGNOSIS — R5383 Other fatigue: Secondary | ICD-10-CM

## 2012-10-06 DIAGNOSIS — J309 Allergic rhinitis, unspecified: Secondary | ICD-10-CM

## 2012-10-06 DIAGNOSIS — Z139 Encounter for screening, unspecified: Secondary | ICD-10-CM

## 2012-10-06 DIAGNOSIS — R7301 Impaired fasting glucose: Secondary | ICD-10-CM

## 2012-10-06 DIAGNOSIS — R5381 Other malaise: Secondary | ICD-10-CM

## 2012-10-06 DIAGNOSIS — E039 Hypothyroidism, unspecified: Secondary | ICD-10-CM

## 2012-10-06 DIAGNOSIS — E785 Hyperlipidemia, unspecified: Secondary | ICD-10-CM

## 2012-10-06 DIAGNOSIS — E669 Obesity, unspecified: Secondary | ICD-10-CM

## 2012-10-06 MED ORDER — AMLODIPINE BESYLATE 5 MG PO TABS: 5.0000 mg | ORAL_TABLET | Freq: Every day | ORAL | Status: DC

## 2012-10-06 MED ORDER — VALSARTAN-HYDROCHLOROTHIAZIDE 320-25 MG PO TABS: 1.0000 | ORAL_TABLET | Freq: Every day | ORAL | Status: DC

## 2012-10-06 MED ORDER — ROSUVASTATIN CALCIUM 10 MG PO TABS: 10.0000 mg | ORAL_TABLET | Freq: Every day | ORAL | Status: DC

## 2012-10-06 NOTE — Progress Notes (Signed)
  Subjective:    Patient ID: [name] [name], female    DOB: Nov 28, 1940, 72 y.o.   MRN: [id number]  HPI The PT is here for follow up and re-evaluation of chronic medical conditions, medication management and review of any available recent lab and radiology data.  Preventive health is updated, specifically  Cancer screening and Immunization.   Questions or concerns regarding consultations or procedures which the PT has had in the interim are  addressed. The PT c/o increased leg swelling on higher dose of norvasc, however, getting accustomed to this and states not unbearable       Review of Systems See HPI Denies recent fever or chills. Denies sinus pressure, nasal congestion, ear pain or sore throat. Denies chest congestion, productive cough or wheezing. Denies chest pains, palpitations pND or orthopnea Denies abdominal pain, nausea, vomiting,diarrhea or constipation.   Denies dysuria, frequency, hesitancy or incontinence. Denies joint pain, swelling and limitation in mobility. Denies headaches, seizures, numbness, or tingling. Denies depression, anxiety or insomnia. Denies skin break down or rash.        Objective:   Physical Exam  Patient alert and oriented and in no cardiopulmonary distress.  HEENT: No facial asymmetry, EOMI, no sinus tenderness,  oropharynx pink and moist.  Neck supple no adenopathy.  Chest: Clear to auscultation bilaterally.  CVS: S1, S2 no murmurs, no S3.  ABD: Soft non tender. Bowel sounds normal.  Ext: No edema  MS: Adequate ROM spine, shoulders, hips and knees.  Skin: Intact, no ulcerations or rash noted.  Psych: Good eye contact, normal affect. Memory intact not anxious or depressed appearing.  CNS: CN 2-12 intact, power, tone and sensation normal throughout.       Assessment & Plan:

## 2012-10-06 NOTE — Patient Instructions (Addendum)
Annual wellness in 4 month, call if you need me before.  Blood sugar is still good  Please reduce portion sizes to help with weight loss.  Thyroid test is good, no med change.  Blood pressure is better, no med change  Please wear compression hose and elevate feet to help with swelling  CBC, fasting lipid, cmp , hBA1C, TSH before next visit.  Please sched May 29 or after mammogram at checkout  Weight loss goal of 5 to 7 pounds

## 2012-10-09 ENCOUNTER — Encounter (INDEPENDENT_AMBULATORY_CARE_PROVIDER_SITE_OTHER): Payer: Medicare Other

## 2012-10-09 DIAGNOSIS — I779 Disorder of arteries and arterioles, unspecified: Secondary | ICD-10-CM

## 2012-10-09 DIAGNOSIS — I6529 Occlusion and stenosis of unspecified carotid artery: Secondary | ICD-10-CM

## 2012-10-13 NOTE — Assessment & Plan Note (Signed)
Deteriorated. Patient re-educated about  the importance of commitment to a  minimum of 150 minutes of exercise per week. The importance of healthy food choices with portion control discussed. Encouraged to start a food diary, count calories and to consider  joining a support group. Sample diet sheets offered. Goals set by the patient for the next several months.    

## 2012-10-13 NOTE — Assessment & Plan Note (Signed)
Improve, continue current medicatio DASH diet and commitment to daily physical activity for a minimum of 30 minutes discussed and encouraged, as a part of hypertension management. The importance of attaining a healthy weight is also discussed.

## 2012-10-13 NOTE — Assessment & Plan Note (Signed)
Increased though still within nl Patient educated about the importance of limiting  Carbohydrate intake , the need to commit to daily physical activity for a minimum of 30 minutes , and to commit weight loss. The fact that changes in all these areas will reduce or eliminate all together the development of diabetes is stressed.

## 2012-10-13 NOTE — Assessment & Plan Note (Signed)
Controlled, no change in medication  

## 2012-10-13 NOTE — Assessment & Plan Note (Signed)
Updated lab needed Hyperlipidemia:Low fat diet discussed and encouraged.   

## 2012-10-18 ENCOUNTER — Encounter: Payer: Self-pay | Admitting: Cardiology

## 2012-10-23 ENCOUNTER — Telehealth: Payer: Self-pay | Admitting: *Deleted

## 2012-10-23 NOTE — Telephone Encounter (Signed)
Patient informed. 

## 2012-11-12 ENCOUNTER — Ambulatory Visit: Payer: Medicare Other | Admitting: Family Medicine

## 2012-12-01 ENCOUNTER — Ambulatory Visit (HOSPITAL_COMMUNITY)
Admission: RE | Admit: 2012-12-01 | Discharge: 2012-12-01 | Disposition: A | Discharge status: Home / Self Care | Payer: Medicare Other | Source: Ambulatory Visit | Attending: Family Medicine | Admitting: Family Medicine

## 2012-12-01 DIAGNOSIS — Z139 Encounter for screening, unspecified: Secondary | ICD-10-CM

## 2012-12-01 DIAGNOSIS — Z1231 Encounter for screening mammogram for malignant neoplasm of breast: Secondary | ICD-10-CM | POA: Insufficient documentation

## 2012-12-04 ENCOUNTER — Other Ambulatory Visit: Payer: Self-pay | Admitting: Family Medicine

## 2012-12-23 ENCOUNTER — Encounter (INDEPENDENT_AMBULATORY_CARE_PROVIDER_SITE_OTHER): Payer: Self-pay | Admitting: *Deleted

## 2012-12-23 ENCOUNTER — Encounter (INDEPENDENT_AMBULATORY_CARE_PROVIDER_SITE_OTHER): Payer: Self-pay

## 2012-12-25 ENCOUNTER — Telehealth (INDEPENDENT_AMBULATORY_CARE_PROVIDER_SITE_OTHER): Payer: Self-pay | Admitting: *Deleted

## 2012-12-25 ENCOUNTER — Other Ambulatory Visit (INDEPENDENT_AMBULATORY_CARE_PROVIDER_SITE_OTHER): Payer: Self-pay | Admitting: *Deleted

## 2012-12-25 DIAGNOSIS — Z800 Family history of malignant neoplasm of digestive organs: Secondary | ICD-10-CM

## 2012-12-25 DIAGNOSIS — Z86010 Personal history of colonic polyps: Secondary | ICD-10-CM

## 2012-12-25 DIAGNOSIS — Z1211 Encounter for screening for malignant neoplasm of colon: Secondary | ICD-10-CM

## 2012-12-25 MED ORDER — PEG-KCL-NACL-NASULF-NA ASC-C 100 G PO SOLR: 1.0000 | Freq: Once | ORAL | Status: DC

## 2012-12-25 NOTE — Telephone Encounter (Signed)
Patient needs movi prep 

## 2013-01-13 ENCOUNTER — Telehealth (INDEPENDENT_AMBULATORY_CARE_PROVIDER_SITE_OTHER): Payer: Self-pay | Admitting: *Deleted

## 2013-01-13 NOTE — Telephone Encounter (Signed)
  Procedure: tcs  Reason/Indication:  Hx polyps, fam hx colon ca  Has patient had this procedure before?  Yes, 12/2007 (scanned)  If so, when, by whom and where?    Is there a family history of colon cancer?  Yes, brother  Who?  What age when diagnosed?    Is patient diabetic?   no      Does patient have prosthetic heart valve?  no  Do you have a pacemaker?  no  Has patient ever had endocarditis? no  Has patient had joint replacement within last 12 months?  no  Is patient on Coumadin, Plavix and/or Aspirin? yes  Medications: see EPIC  Allergies: see EPIC  Medication Adjustment: asa 2 days  Procedure date & time: 02/04/13 at 1015

## 2013-01-13 NOTE — Telephone Encounter (Signed)
agree

## 2013-01-27 ENCOUNTER — Encounter (HOSPITAL_COMMUNITY): Payer: Self-pay | Admitting: Pharmacy Technician

## 2013-02-02 ENCOUNTER — Other Ambulatory Visit: Payer: Self-pay | Admitting: Family Medicine

## 2013-02-04 ENCOUNTER — Encounter (HOSPITAL_COMMUNITY): Payer: Self-pay | Admitting: *Deleted

## 2013-02-04 ENCOUNTER — Ambulatory Visit (HOSPITAL_COMMUNITY)
Admission: RE | Admit: 2013-02-04 | Discharge: 2013-02-04 | Disposition: A | Discharge status: Home / Self Care | Payer: Medicare Other | Source: Ambulatory Visit | Attending: Internal Medicine | Admitting: Internal Medicine

## 2013-02-04 ENCOUNTER — Encounter (HOSPITAL_COMMUNITY)
Admission: RE | Disposition: A | Discharge status: Home / Self Care | Payer: Self-pay | Source: Ambulatory Visit | Attending: Internal Medicine

## 2013-02-04 DIAGNOSIS — Z800 Family history of malignant neoplasm of digestive organs: Secondary | ICD-10-CM

## 2013-02-04 DIAGNOSIS — Z8371 Family history of colonic polyps: Secondary | ICD-10-CM

## 2013-02-04 DIAGNOSIS — Z860100 Personal history of colon polyps, unspecified: Secondary | ICD-10-CM | POA: Insufficient documentation

## 2013-02-04 DIAGNOSIS — Z951 Presence of aortocoronary bypass graft: Secondary | ICD-10-CM | POA: Insufficient documentation

## 2013-02-04 DIAGNOSIS — K5730 Diverticulosis of large intestine without perforation or abscess without bleeding: Secondary | ICD-10-CM

## 2013-02-04 DIAGNOSIS — K620 Anal polyp: Secondary | ICD-10-CM | POA: Insufficient documentation

## 2013-02-04 DIAGNOSIS — D126 Benign neoplasm of colon, unspecified: Secondary | ICD-10-CM

## 2013-02-04 DIAGNOSIS — I10 Essential (primary) hypertension: Secondary | ICD-10-CM | POA: Insufficient documentation

## 2013-02-04 DIAGNOSIS — K644 Residual hemorrhoidal skin tags: Secondary | ICD-10-CM | POA: Insufficient documentation

## 2013-02-04 HISTORY — PX: COLONOSCOPY: SHX5424

## 2013-02-04 SURGERY — COLONOSCOPY
Anesthesia: Moderate Sedation

## 2013-02-04 MED ORDER — MEPERIDINE HCL 50 MG/ML IJ SOLN
INTRAMUSCULAR | Status: AC
  Filled 2013-02-04: qty 1

## 2013-02-04 MED ORDER — STERILE WATER FOR IRRIGATION IR SOLN
Status: DC | PRN
  Administered 2013-02-04: 10:00:00

## 2013-02-04 MED ORDER — SODIUM CHLORIDE 0.9 % IV SOLN
INTRAVENOUS | Status: DC
  Administered 2013-02-04: 10:00:00 via INTRAVENOUS

## 2013-02-04 MED ORDER — MIDAZOLAM HCL 5 MG/5ML IJ SOLN
INTRAMUSCULAR | Status: AC
  Filled 2013-02-04: qty 10

## 2013-02-04 MED ORDER — MEPERIDINE HCL 50 MG/ML IJ SOLN
INTRAMUSCULAR | Status: DC | PRN
  Administered 2013-02-04 (×2): 25 mg via INTRAVENOUS

## 2013-02-04 MED ORDER — MIDAZOLAM HCL 5 MG/5ML IJ SOLN
INTRAMUSCULAR | Status: DC | PRN
  Administered 2013-02-04: 2 mg via INTRAVENOUS
  Administered 2013-02-04: 1 mg via INTRAVENOUS
  Administered 2013-02-04: 2 mg via INTRAVENOUS

## 2013-02-04 NOTE — H&P (Signed)
[name] [name] is an 72 y.o. female.   Chief Complaint: Patient is here for colonoscopy. HPI: Patient is 72 year old African female with history of colonic adenomas and family history of colon carcinoma. Patient's last colonoscopy was in July 2009 animation in [place] with removal of tubular adenoma and tubulovillous adenoma. A brother had colon carcinoma diagnosed at age 57 and metastatic disease 18 months later. She has 2 sisters with history of colonic polyps. She denies abdominal pain change in bowel habits or rectal bleeding.  Past Medical History  Diagnosis Date  . Obesity   . 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 Hyperlipidemia   . Hypertension   . Glaucoma dx 2010    had laser to both eyes in 2010  . Hypothyroidism   . Renal insufficiency, mild   . Carotid artery disease 11/2008    doppler 11/2008.Marland KitchenMarland Kitchen50% RICA / doppler 03/2010.Marland KitchenMarland KitchenMarland Kitchen60-79% RICA ...less than [id number] LICA.Marland KitchenMarland KitchenPlan   . Hx of CABG     2005  . Ejection fraction     EF 70%, nuclear, July, 2009     Past Surgical History  Procedure Laterality Date  . Coronary artery bypass graft x4 (10/05)    . Coronary artery bypass graft  2004    [facility]    Family History  Problem Relation Age of Onset  . Aneurysm Mother   . Hypertension Mother   . Coronary artery disease Mother   . Coronary artery disease Father   . Diabetes Sister   . Hypertension Sister   . Coronary artery disease Sister   . Diabetes Son   . Colon cancer Brother 63    lived 18 months afterwards   Social History:  reports that she quit smoking about 9 years ago. Her smoking use included Cigarettes. She has a 12 pack-year smoking history. She has never used smokeless tobacco. She reports that she does not drink alcohol or use illicit drugs.  Allergies:  Allergies  Allergen Reactions  . Clonidine Derivatives Other (See Comments)    Zombie BP dropped    Medications Prior to  Admission  Medication Sig Dispense Refill  . acetaminophen (TYLENOL) 500 MG tablet Take 1,000 mg by mouth every 6 (six) hours as needed for pain.      Marland Kitchen amLODipine (NORVASC) 2.5 MG tablet Take 1 tablet (2.5 mg total) by mouth daily.  30 tablet  11  . amLODipine (NORVASC) 5 MG tablet Take 1 tablet (5 mg total) by mouth daily.  90 tablet  1  . ARMOUR THYROID 30 MG tablet TAKE TWO TABLETS EVERY DAY  60 tablet  0  . aspirin (ASPIRIN LOW DOSE) 81 MG EC tablet Take 81 mg by mouth daily. Take one tablet by mouth once a day       . Calcium-Vitamin D 600-200 MG-UNIT per tablet Take 1 tablet by mouth daily.      . cetirizine (ZYRTEC) 10 MG tablet Take 10 mg by mouth daily.      . Cholecalciferol (VITAMIN D3) 1000 UNITS capsule Take 1,000 Units by mouth daily. Take 1 tablet by mouth once a day       . fish oil-omega-3 fatty acids 1000 MG capsule Take 2 g by mouth daily.      Marland Kitchen latanoprost (XALATAN) 0.005 % ophthalmic solution Place 1 drop into both eyes at bedtime. One drop each eye at bedtime       .  metoprolol (TOPROL-XL) 200 MG 24 hr tablet TAKE ONE TABLET DAILY  90 tablet  1  . Multiple Vitamins-Minerals (THERATRUM COMPLETE 50 PLUS PO) Take by mouth. Take one tablet by mouth daily        . rosuvastatin (CRESTOR) 10 MG tablet Take 1 tablet (10 mg total) by mouth daily. One tablet by mouth once daily  90 tablet  1  . thyroid (ARMOUR THYROID) 30 MG tablet Two tablets every day. Dose increase effective 07/10/2012  60 tablet  5  . valsartan-hydrochlorothiazide (DIOVAN HCT) 320-25 MG per tablet Take 1 tablet by mouth daily. One table by mouth once daily  90 tablet  1  . fluticasone (FLONASE) 50 MCG/ACT nasal spray Place 2 sprays into the nose daily.      Marland Kitchen ipratropium-albuterol (DUONEB) 0.5-2.5 (3) MG/3ML SOLN Take 3 mLs by nebulization every 8 (eight) hours as needed.  270 mL  0    No results found for this or any previous visit (from the past 48 hour(s)). No results found.  ROS  Blood pressure  163/63, temperature 97.7 F (36.5 C), temperature source Oral, resp. rate 16, height 5\' 1"  (1.549 m), weight 208 lb (94.348 kg), SpO2 98.00%. Physical Exam  Constitutional: She appears well-developed and well-nourished.  HENT:  Mouth/Throat: Oropharynx is clear and moist.  Eyes: Conjunctivae are normal. No scleral icterus.  Neck: No thyromegaly present.  Cardiovascular: Normal rate, regular rhythm and normal heart sounds.   No murmur heard. Respiratory: Effort normal and breath sounds normal.  GI: Soft. She exhibits no distension and no mass. There is no tenderness.  Musculoskeletal: She exhibits no edema.  Lymphadenopathy:    She has no cervical adenopathy.  Neurological: She is alert.  Skin: Skin is warm and dry.     Assessment/Plan History of colonic adenomas. Family history of CRC(brother) and colonic polyps. Surveillance colonoscopy.  REHMAN,NAJEEB U 02/04/2013, 10:15 AM

## 2013-02-04 NOTE — Op Note (Signed)
COLONOSCOPY PROCEDURE REPORT  PATIENT:  [name] [name]  MR#:  [id number] Birthdate:  Aug 28, 1940, 72 y.o., female Endoscopist:  Dr. Rogene Houston, MD Referred By:  Dr. Tula Nakayama, MD  Procedure Date: 02/04/2013  Procedure:   Colonoscopy with snare polypectomy.  Indications:  Patient is 73 year old African female with history of colonic adenomas and family history of colon carcinoma. Last examinations in July 2009 with removal of tubular adenoma and tubulovillous adenoma.  Informed Consent:  The procedure and risks were reviewed with the patient and informed consent was obtained.  Medications:  Demerol 50 mg IV Versed 5 mg IV  Description of procedure:  After a digital rectal exam was performed, that colonoscope was advanced from the anus through the rectum and colon to the area of the cecum, ileocecal valve and appendiceal orifice. The cecum was deeply intubated. These structures were well-seen and photographed for the record. From the level of the cecum and ileocecal valve, the scope was slowly and cautiously withdrawn. The mucosal surfaces were carefully surveyed utilizing scope tip to flexion to facilitate fold flattening as needed. The scope was pulled down into the rectum where a thorough exam including retroflexion was performed.  Findings:  Prep excellent. Two small polyps ablated with combination of cold snare and biopsy from proximal transverse colon and submitted together. Two small polyps ablated via cold biopsy from distal transverse colon and submitted together.  10 mm pedunculated polyp snared from the descending colon. Scattered diverticula at sigmoid colon. Two small rectal polyps ablated using snare tip. Small hemorrhoids below the dentate line.   Therapeutic/Diagnostic Maneuvers Performed:  See above  Complications:  None  Cecal Withdrawal Time:  27 minutes  Impression:  Examination performed to cecum. Two small polyps removed from proximal transverse  colon and submitted together as above. Two small polyps ablated via cold biopsy from distal transverse colon and submitted together. 10 mm pedunculated polyp snared from descending colon. Two small rectal polyps or ablated using snare tip. Mild sigmoid colon diverticulosis. Small external hemorrhoids.   Recommendations:  Standard instructions given. I will contact patient with biopsy results and further recommendations.  [name] [name] U  02/04/2013 11:04 AM  CC: Dr. Tula Nakayama, MD & Dr. Rayne Du ref. provider found

## 2013-02-06 ENCOUNTER — Encounter (HOSPITAL_COMMUNITY): Payer: Self-pay | Admitting: Internal Medicine

## 2013-02-10 LAB — CBC WITH DIFFERENTIAL/PLATELET
Eosinophils Relative: 8 % — ABNORMAL HIGH (ref 0–5)
HCT: 33.6 % — ABNORMAL LOW (ref 36.0–46.0)
Lymphocytes Relative: 48 % — ABNORMAL HIGH (ref 12–46)
Lymphs Abs: 3.4 10*3/uL (ref 0.7–4.0)
MCV: 85.5 fL (ref 78.0–100.0)
Monocytes Absolute: 0.4 10*3/uL (ref 0.1–1.0)
Platelets: 189 10*3/uL (ref 150–400)
RBC: 3.93 MIL/uL (ref 3.87–5.11)
WBC: 7.2 10*3/uL (ref 4.0–10.5)

## 2013-02-10 LAB — COMPREHENSIVE METABOLIC PANEL
Alkaline Phosphatase: 65 U/L (ref 39–117)
BUN: 20 mg/dL (ref 6–23)
Creat: 1.31 mg/dL — ABNORMAL HIGH (ref 0.50–1.10)
Glucose, Bld: 93 mg/dL (ref 70–99)
Sodium: 142 mEq/L (ref 135–145)
Total Bilirubin: 0.4 mg/dL (ref 0.3–1.2)
Total Protein: 7.7 g/dL (ref 6.0–8.3)

## 2013-02-10 LAB — TSH: TSH: 1.036 u[IU]/mL (ref 0.350–4.500)

## 2013-02-10 LAB — LIPID PANEL
HDL: 45 mg/dL (ref >39)
Total CHOL/HDL Ratio: 2.9 Ratio
Triglycerides: 99 mg/dL (ref <150)

## 2013-02-20 ENCOUNTER — Encounter: Payer: Self-pay | Admitting: Family Medicine

## 2013-02-20 ENCOUNTER — Other Ambulatory Visit: Payer: Self-pay

## 2013-02-20 ENCOUNTER — Ambulatory Visit (INDEPENDENT_AMBULATORY_CARE_PROVIDER_SITE_OTHER): Payer: Medicare Other | Admitting: Family Medicine

## 2013-02-20 VITALS — BP 140/74 | HR 71 | Resp 16 | Ht 61.0 in | Wt 195.8 lb

## 2013-02-20 DIAGNOSIS — I10 Essential (primary) hypertension: Secondary | ICD-10-CM

## 2013-02-20 DIAGNOSIS — R7309 Other abnormal glucose: Secondary | ICD-10-CM

## 2013-02-20 DIAGNOSIS — D649 Anemia, unspecified: Secondary | ICD-10-CM

## 2013-02-20 DIAGNOSIS — R7302 Impaired glucose tolerance (oral): Secondary | ICD-10-CM

## 2013-02-20 DIAGNOSIS — R5381 Other malaise: Secondary | ICD-10-CM

## 2013-02-20 DIAGNOSIS — R7301 Impaired fasting glucose: Secondary | ICD-10-CM

## 2013-02-20 DIAGNOSIS — E039 Hypothyroidism, unspecified: Secondary | ICD-10-CM

## 2013-02-20 DIAGNOSIS — E785 Hyperlipidemia, unspecified: Secondary | ICD-10-CM

## 2013-02-20 DIAGNOSIS — E669 Obesity, unspecified: Secondary | ICD-10-CM

## 2013-02-20 MED ORDER — VALSARTAN-HYDROCHLOROTHIAZIDE 320-25 MG PO TABS: 1.0000 | ORAL_TABLET | Freq: Every day | ORAL | Status: DC

## 2013-02-20 MED ORDER — AMLODIPINE BESYLATE 5 MG PO TABS: 5.0000 mg | ORAL_TABLET | Freq: Every day | ORAL | Status: DC

## 2013-02-20 MED ORDER — AMLODIPINE BESYLATE 2.5 MG PO TABS: 2.5000 mg | ORAL_TABLET | Freq: Every day | ORAL | Status: DC

## 2013-02-20 MED ORDER — METOPROLOL SUCCINATE ER 200 MG PO TB24: ORAL_TABLET | ORAL | Status: DC

## 2013-02-20 MED ORDER — ROSUVASTATIN CALCIUM 10 MG PO TABS: 10.0000 mg | ORAL_TABLET | Freq: Every day | ORAL | Status: DC

## 2013-02-20 MED ORDER — THYROID 30 MG PO TABS: ORAL_TABLET | ORAL | Status: DC

## 2013-02-20 NOTE — Progress Notes (Signed)
  Subjective:    Patient ID: [name] [name], female    DOB: Jul 26, 1940, 72 y.o.   MRN: [id number]  HPI The PT is here for follow up and re-evaluation of chronic medical conditions, medication management and review of any available recent lab and radiology data.  Preventive health is updated, specifically  Cancer screening and Immunization.   Questions or concerns regarding consultations or procedures which the PT has had in the interim are  Addressed.Recently had a colonoscopy, had polyps and will need close f/u The PT denies any adverse reactions to current medications since the last visit.  States that her sister has been suggesting she start "nerve pills"because at times , when she calls her, she seems excessively anxious and stressed, states this is because of her husband, who turns TV loud and also is up pacing all night. Neither is new , but worsening. She is willing to work through this without medication which she has done for years, also he is being evaluated for hearing aids as well as is to see a  Teacher, music for med management      Review of Systems See HPI Denies recent fever or chills. Denies sinus pressure, nasal congestion, ear pain or sore throat. Denies chest congestion, productive cough or wheezing. Denies chest pains, palpitations and leg swelling Denies abdominal pain, nausea, vomiting,diarrhea or constipation.   Denies dysuria, frequency, hesitancy or incontinence. Denies joint pain, swelling and limitation in mobility. Denies headaches, seizures, numbness, or tingling. Denies depression,  does have some anxiety and  insomnia. Denies skin break down or rash.        Objective:   Physical Exam  Patient alert and oriented and in no cardiopulmonary distress.  HEENT: No facial asymmetry, EOMI, no sinus tenderness,  oropharynx pink and moist.  Neck supple no adenopathy.  Chest: Clear to auscultation bilaterally.  CVS: S1, S2 no murmurs, no S3.  ABD: Soft non  tender. Bowel sounds normal.  Ext: No edema  MS: Adequate ROM spine, shoulders, hips and knees.  Skin: Intact, no ulcerations or rash noted.  Psych: Good eye contact, normal affect. Memory intact not anxious or depressed appearing.  CNS: CN 2-12 intact, power, tone and sensation normal throughout.       Assessment & Plan:

## 2013-02-20 NOTE — Patient Instructions (Addendum)
F/u in January, call if you need me before  Please call in October for your flu vaccine  Labs look good  No medication changes.  I am happy that you had your colonoscopy  Fasting lipid, cmp , hBA1C, TSH, and CBC in January before visit  Your husband is referred to ENT and to psychiatry, hopeful  This will improve his health and therefore  also yours

## 2013-02-21 NOTE — Assessment & Plan Note (Signed)
Improved. Pt applauded on succesful weight loss through lifestyle change, and encouraged to continue same. Weight loss goal set for the next several months.  

## 2013-02-21 NOTE — Assessment & Plan Note (Signed)
Adequate control, no med change DASH diet and commitment to daily physical activity for a minimum of 30 minutes discussed and encouraged, as a part of hypertension management. The importance of attaining a healthy weight is also discussed.

## 2013-02-21 NOTE — Assessment & Plan Note (Signed)
Controlled, no change in medication  

## 2013-02-21 NOTE — Assessment & Plan Note (Signed)
Improved, pt congratulated on this and encouraged to continue same Patient educated about the importance of limiting  Carbohydrate intake , the need to commit to daily physical activity for a minimum of 30 minutes , and to commit weight loss. The fact that changes in all these areas will reduce or eliminate all together the development of diabetes is stressed.

## 2013-02-21 NOTE — Assessment & Plan Note (Signed)
Controlled, no change in medication Hyperlipidemia:Low fat diet discussed and encouraged.  \ 

## 2013-04-02 ENCOUNTER — Ambulatory Visit (INDEPENDENT_AMBULATORY_CARE_PROVIDER_SITE_OTHER): Payer: Medicare Other

## 2013-04-02 DIAGNOSIS — Z23 Encounter for immunization: Secondary | ICD-10-CM

## 2013-05-04 ENCOUNTER — Other Ambulatory Visit: Payer: Self-pay | Admitting: Family Medicine

## 2013-06-30 LAB — LIPID PANEL
Cholesterol: 124 mg/dL (ref 0–200)
HDL: 39 mg/dL — ABNORMAL LOW (ref >39)
Total CHOL/HDL Ratio: 3.2 Ratio
VLDL: 23 mg/dL (ref 0–40)

## 2013-06-30 LAB — CBC WITH DIFFERENTIAL/PLATELET
Basophils Relative: 1 % (ref 0–1)
HCT: 32.2 % — ABNORMAL LOW (ref 36.0–46.0)
Hemoglobin: 10.9 g/dL — ABNORMAL LOW (ref 12.0–15.0)
Lymphocytes Relative: 42 % (ref 12–46)
Lymphs Abs: 2.7 10*3/uL (ref 0.7–4.0)
Monocytes Absolute: 0.6 10*3/uL (ref 0.1–1.0)
Monocytes Relative: 9 % (ref 3–12)
Neutro Abs: 2.6 10*3/uL (ref 1.7–7.7)
Neutrophils Relative %: 40 % — ABNORMAL LOW (ref 43–77)
RBC: 3.79 MIL/uL — ABNORMAL LOW (ref 3.87–5.11)

## 2013-06-30 LAB — COMPREHENSIVE METABOLIC PANEL
AST: 27 U/L (ref 0–37)
Alkaline Phosphatase: 68 U/L (ref 39–117)
BUN: 29 mg/dL — ABNORMAL HIGH (ref 6–23)
Glucose, Bld: 88 mg/dL (ref 70–99)
Potassium: 4.8 mEq/L (ref 3.5–5.3)
Sodium: 140 mEq/L (ref 135–145)
Total Bilirubin: 0.5 mg/dL (ref 0.3–1.2)

## 2013-06-30 LAB — TSH: TSH: 1.72 u[IU]/mL (ref 0.350–4.500)

## 2013-06-30 LAB — HEMOGLOBIN A1C: Mean Plasma Glucose: 128 mg/dL — ABNORMAL HIGH (ref <117)

## 2013-07-09 ENCOUNTER — Ambulatory Visit (INDEPENDENT_AMBULATORY_CARE_PROVIDER_SITE_OTHER): Payer: Medicare Other | Admitting: Family Medicine

## 2013-07-09 ENCOUNTER — Encounter (INDEPENDENT_AMBULATORY_CARE_PROVIDER_SITE_OTHER): Payer: Self-pay

## 2013-07-09 ENCOUNTER — Encounter: Payer: Self-pay | Admitting: Family Medicine

## 2013-07-09 VITALS — BP 144/72 | HR 60 | Resp 18 | Ht 61.0 in | Wt 196.1 lb

## 2013-07-09 DIAGNOSIS — E785 Hyperlipidemia, unspecified: Secondary | ICD-10-CM

## 2013-07-09 DIAGNOSIS — E039 Hypothyroidism, unspecified: Secondary | ICD-10-CM

## 2013-07-09 DIAGNOSIS — J309 Allergic rhinitis, unspecified: Secondary | ICD-10-CM

## 2013-07-09 DIAGNOSIS — I10 Essential (primary) hypertension: Secondary | ICD-10-CM

## 2013-07-09 DIAGNOSIS — E8881 Metabolic syndrome: Secondary | ICD-10-CM

## 2013-07-09 DIAGNOSIS — R7309 Other abnormal glucose: Secondary | ICD-10-CM

## 2013-07-09 DIAGNOSIS — E669 Obesity, unspecified: Secondary | ICD-10-CM

## 2013-07-09 DIAGNOSIS — R7302 Impaired glucose tolerance (oral): Secondary | ICD-10-CM

## 2013-07-09 MED ORDER — THYROID 30 MG PO TABS: ORAL_TABLET | ORAL | Status: DC

## 2013-07-09 MED ORDER — METOPROLOL SUCCINATE ER 200 MG PO TB24: ORAL_TABLET | ORAL | Status: DC

## 2013-07-09 MED ORDER — VALSARTAN-HYDROCHLOROTHIAZIDE 320-25 MG PO TABS: ORAL_TABLET | ORAL | Status: DC

## 2013-07-09 MED ORDER — ROSUVASTATIN CALCIUM 10 MG PO TABS: 10.0000 mg | ORAL_TABLET | Freq: Every day | ORAL | Status: DC

## 2013-07-09 NOTE — Progress Notes (Signed)
   Subjective:    Patient ID: [name] [name], female    DOB: 1941-02-12, 73 y.o.   MRN: [id number]  HPI The PT is here for follow up and re-evaluation of chronic medical conditions, medication management and review of any available recent lab and radiology data.  Preventive health is updated, specifically  Cancer screening and Immunization.    The PT denies any adverse reactions to current medications since the last visit.  Concerned about deterioration in her spouse's health, with poor appetite and increased irritability espescially in past week, also recent fall. Denies depression, states she just gets irritated espescialy since people are holding her responsible for the deterioration in his health C/o bilateral leg/ankle swelling which worsens as day progresses , and resolves overnight with elevation of her feet   Review of Systems See HPI Denies recent fever or chills. Denies sinus pressure, nasal congestion, ear pain or sore throat. Denies chest congestion, productive cough or wheezing. Denies chest pains, palpitations and leg swelling Denies abdominal pain, nausea, vomiting,diarrhea or constipation.   Denies dysuria, frequency, hesitancy or incontinence. c/ojoint pain, intermittently with  limitation in mobility. Denies headaches, seizures, numbness, or tingling. Denies depression, anxiety or insomnia. Denies skin break down or rash.        Objective:   Physical Exam Patient alert and oriented and in no cardiopulmonary distress.  HEENT: No facial asymmetry, EOMI, no sinus tenderness,  oropharynx pink and moist.  Neck supple no adenopathy.  Chest: Clear to auscultation bilaterally.  CVS: S1, S2 no murmurs, no S3.  ABD: Soft non tender. Bowel sounds normal.  Ext: No edema, ankles are swollen  MS: Adequate though reduced  ROM spine, shoulders, hips and knees.  Skin: Intact, no ulcerations or rash noted.  Psych: Good eye contact, normal affect. Memory intact not  anxious or depressed appearing.  CNS: CN 2-12 intact, power, tone and sensation normal throughout.        Assessment & Plan:

## 2013-07-09 NOTE — Patient Instructions (Addendum)
F/u in 4 month, call if you need me before  No change in your medication.  Continue healthy eating habits,   TSH in 4 months, chem 7 , hBA1C non fasting

## 2013-07-10 DIAGNOSIS — E8881 Metabolic syndrome: Secondary | ICD-10-CM | POA: Insufficient documentation

## 2013-07-10 NOTE — Assessment & Plan Note (Signed)
unchanged Patient re-educated about  the importance of commitment to a  minimum of 150 minutes of exercise per week. The importance of healthy food choices with portion control discussed. Encouraged to start a food diary, count calories and to consider  joining a support group. Sample diet sheets offered. Goals set by the patient for the next several months.    

## 2013-07-10 NOTE — Assessment & Plan Note (Signed)
Controlled, no change in medication Decreased HDl, needs to commit to regular physical activity. Hyperlipidemia:Low fat diet discussed and encouraged.

## 2013-07-10 NOTE — Assessment & Plan Note (Signed)
Controlled, no change in medication  

## 2013-07-10 NOTE — Assessment & Plan Note (Signed)
Unchanged Patient educated about the importance of limiting  Carbohydrate intake , the need to commit to daily physical activity for a minimum of 30 minutes , and to commit weight loss. The fact that changes in all these areas will reduce or eliminate all together the development of diabetes is stressed.    

## 2013-07-10 NOTE — Assessment & Plan Note (Signed)
Pt already has established vascular disease and needs to worlk aggressively at risk factor reduction for progression of disease

## 2013-07-10 NOTE — Assessment & Plan Note (Signed)
Controlled, no change in medication DASH diet and commitment to daily physical activity for a minimum of 30 minutes discussed and encouraged, as a part of hypertension management. The importance of attaining a healthy weight is also discussed.  

## 2013-08-03 ENCOUNTER — Encounter: Payer: Self-pay | Admitting: Cardiology

## 2013-08-05 ENCOUNTER — Encounter: Payer: Self-pay | Admitting: Cardiology

## 2013-08-05 ENCOUNTER — Ambulatory Visit (INDEPENDENT_AMBULATORY_CARE_PROVIDER_SITE_OTHER): Payer: Medicare Other | Admitting: Cardiology

## 2013-08-05 VITALS — BP 169/69 | HR 48 | Ht 61.0 in | Wt 191.0 lb

## 2013-08-05 DIAGNOSIS — R001 Bradycardia, unspecified: Secondary | ICD-10-CM

## 2013-08-05 DIAGNOSIS — I498 Other specified cardiac arrhythmias: Secondary | ICD-10-CM

## 2013-08-05 DIAGNOSIS — I2510 Atherosclerotic heart disease of native coronary artery without angina pectoris: Secondary | ICD-10-CM

## 2013-08-05 MED ORDER — METOPROLOL SUCCINATE ER 100 MG PO TB24: 100.0000 mg | ORAL_TABLET | Freq: Every day | ORAL | Status: DC

## 2013-08-05 NOTE — Progress Notes (Signed)
HPI  Patient is seen today for yearly followup the followup coronary artery disease. She underwent bypass surgery in 2005. Nuclear stress study in 2009 revealed no marked ischemia. Ejection fraction is normal. She's not having any chest pain or shortness of breath. Her blood pressure is elevated today but she has not yet taking her morning medicines. She is followed very carefully by Dr. Moshe Cipro.  Allergies  Allergen Reactions  . Clonidine Derivatives Other (See Comments)    Zombie BP dropped    Current Outpatient Prescriptions  Medication Sig Dispense Refill  . acetaminophen (TYLENOL) 500 MG tablet Take 1,000 mg by mouth every 6 (six) hours as needed for pain.      Marland Kitchen amLODipine (NORVASC) 2.5 MG tablet Take 1 tablet (2.5 mg total) by mouth daily.  90 tablet  3  . amLODipine (NORVASC) 5 MG tablet Take 1 tablet (5 mg total) by mouth daily.  90 tablet  1  . aspirin 81 MG tablet Take 81 mg by mouth daily.      . cetirizine (ZYRTEC) 10 MG tablet Take 10 mg by mouth daily.      . Cholecalciferol (VITAMIN D3) 1000 UNITS CAPS Take 1 capsule by mouth daily.      . fish oil-omega-3 fatty acids 1000 MG capsule Take 2 g by mouth daily.      . fluticasone (FLONASE) 50 MCG/ACT nasal spray Place 2 sprays into the nose daily.      Marland Kitchen ipratropium-albuterol (DUONEB) 0.5-2.5 (3) MG/3ML SOLN Take 3 mLs by nebulization every 8 (eight) hours as needed.  270 mL  0  . latanoprost (XALATAN) 0.005 % ophthalmic solution Place 1 drop into both eyes at bedtime. One drop each eye at bedtime       . metoprolol (TOPROL-XL) 200 MG 24 hr tablet TAKE ONE TABLET DAILY  90 tablet  1  . Multiple Vitamins-Minerals (THERATRUM COMPLETE 50 PLUS PO) Take by mouth. Take one tablet by mouth daily        . omeprazole (PRILOSEC) 40 MG capsule Take 40 mg by mouth daily.      . rosuvastatin (CRESTOR) 10 MG tablet Take 10 mg by mouth daily.      Marland Kitchen thyroid (ARMOUR) 30 MG tablet Take 60 mg by mouth every morning.      .  valsartan-hydrochlorothiazide (DIOVAN-HCT) 320-25 MG per tablet TAKE ONE TABLET BY MOUTH DAILY  90 tablet  1   No current facility-administered medications for this visit.    History   Social History  . Marital Status: Married    Spouse Name: N/A    Number of Children: 1  . Years of Education: N/A   Occupational History  . retired     Social History Main Topics  . Smoking status: Former Smoker -- 0.30 packs/day for 40 years    Types: Cigarettes    Quit date: 07/03/2003  . Smokeless tobacco: Never Used  . Alcohol Use: No  . Drug Use: No  . Sexual Activity: Not on file   Other Topics Concern  . Not on file   Social History Narrative  . No narrative on file    Family History  Problem Relation Age of Onset  . Aneurysm Mother   . Hypertension Mother   . Coronary artery disease Mother   . Coronary artery disease Father   . Diabetes Sister   . Hypertension Sister   . Coronary artery disease Sister   . Diabetes Son   . Colon  cancer Brother 23    lived 18 months afterwards    Past Medical History  Diagnosis Date  . Obesity   . 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 Hyperlipidemia   . Hypertension   . Glaucoma dx 2010    had laser to both eyes in 2010  . Hypothyroidism   . Renal insufficiency, mild   . Carotid artery disease 11/2008    doppler 11/2008.Marland KitchenMarland Kitchen50% RICA / doppler 03/2010.Marland KitchenMarland KitchenMarland Kitchen60-79% RICA ...less than [id number] LICA.Marland KitchenMarland KitchenPlan   . Hx of CABG     2005  . Ejection fraction     EF 70%, nuclear, July, 2009     Past Surgical History  Procedure Laterality Date  . Coronary artery bypass graft x4 (10/05)    . Coronary artery bypass graft  2004    [facility]  . Colonoscopy N/A 02/04/2013    Procedure: COLONOSCOPY;  Surgeon: Rogene Houston, MD;  Location: [facility];  Service: Endoscopy;  Laterality: N/A;  1015    Patient Active Problem List   Diagnosis Date Noted  . Ejection fraction      Priority: High  . CORONARY ARTERY BYPASS GRAFT, HX OF 01/31/2009    Priority: High  . Metabolic syndrome X [id number]  . Zoster 10/24/2011  . CAD (coronary artery disease)   . CKD (chronic kidney disease) 07/11/2011  . HYPOTHYROIDISM 01/31/2009  . RENAL INSUFFICIENCY 01/31/2009  . Carotid artery disease 11/30/2008  . IGT (impaired glucose tolerance) 11/28/2008  . ALLERGIC RHINITIS CAUSE UNSPECIFIED 08/12/2008  . HYPERLIPIDEMIA 10/07/2007  . OBESITY 10/07/2007  . HYPERTENSION 10/07/2007    ROS   Patient denies fever, chills, headache, sweats, rash, change in vision, change in hearing, chest pain, cough, nausea or vomiting, urinary symptoms. All other systems are reviewed and are negative.  PHYSICAL EXAM  Patient is overweight. She is oriented to person time and place. Affect is normal. There is no jugulovenous distention. Lungs are clear. Respiratory effort is nonlabored. Cardiac exam reveals S1 and S2. There no clicks or significant murmurs. The abdomen is soft. There is no peripheral edema.  Filed Vitals:   08/05/13 1000  BP: 169/69  Pulse: 48  Height: 5\' 1"  (1.549 m)  Weight: 191 lb (86.637 kg)  SpO2: 99%   EKG is done today and reviewed by me. She has sinus bradycardia with a rate of 45. She has old diffuse T-wave inversions and there is no change.  ASSESSMENT & PLAN

## 2013-08-05 NOTE — Assessment & Plan Note (Signed)
Her rate today is only 45. She is on metoprolol 200 mg daily. I've asked her to cut this tablet in half so that she will be on 100 mg daily. I doubt that this will have a significant effect on her blood pressure. She's not having symptoms but her resting rate is really quite low.

## 2013-08-05 NOTE — Patient Instructions (Signed)
Your physician recommends that you schedule a follow-up appointment in: 1 year. You will receive a reminder letter in the mail in about 10 months reminding you to call and schedule your appointment. If you don't receive this letter, please contact our office. Your physician has recommended you make the following change in your medication: Decrease your metoprolol succinate to 100 mg daily. Please break your 200 mg tablet in half daily until they are finished. Your new prescription has been sent to your pharmacy. All other medications will remain the same.

## 2013-08-05 NOTE — Assessment & Plan Note (Signed)
Coronary disease is stable. Her last exercise test was 2009. She does not need a stress test at this time. She is on appropriate medications.

## 2013-08-10 ENCOUNTER — Other Ambulatory Visit: Payer: Self-pay | Admitting: Family Medicine

## 2013-10-19 ENCOUNTER — Other Ambulatory Visit: Payer: Self-pay | Admitting: Family Medicine

## 2013-10-27 ENCOUNTER — Other Ambulatory Visit: Payer: Self-pay | Admitting: Family Medicine

## 2013-10-27 DIAGNOSIS — Z139 Encounter for screening, unspecified: Secondary | ICD-10-CM

## 2013-11-03 LAB — BASIC METABOLIC PANEL
BUN: 22 mg/dL (ref 6–23)
CALCIUM: 10.6 mg/dL — AB (ref 8.4–10.5)
CO2: 29 mEq/L (ref 19–32)
CREATININE: 1.24 mg/dL — AB (ref 0.50–1.10)
Chloride: 106 mEq/L (ref 96–112)
Glucose, Bld: 90 mg/dL (ref 70–99)
Potassium: 4.9 mEq/L (ref 3.5–5.3)
Sodium: 141 mEq/L (ref 135–145)

## 2013-11-03 LAB — HEMOGLOBIN A1C
HEMOGLOBIN A1C: 6.3 % — AB (ref <5.7)
MEAN PLASMA GLUCOSE: 134 mg/dL — AB (ref <117)

## 2013-11-03 LAB — TSH: TSH: 0.963 u[IU]/mL (ref 0.350–4.500)

## 2013-11-10 ENCOUNTER — Ambulatory Visit (INDEPENDENT_AMBULATORY_CARE_PROVIDER_SITE_OTHER): Payer: Medicare Other | Admitting: Family Medicine

## 2013-11-10 ENCOUNTER — Encounter (INDEPENDENT_AMBULATORY_CARE_PROVIDER_SITE_OTHER): Payer: Self-pay

## 2013-11-10 ENCOUNTER — Encounter: Payer: Self-pay | Admitting: Family Medicine

## 2013-11-10 VITALS — BP 166/70 | HR 55 | Resp 16 | Wt 186.0 lb

## 2013-11-10 DIAGNOSIS — I10 Essential (primary) hypertension: Secondary | ICD-10-CM

## 2013-11-10 DIAGNOSIS — R7302 Impaired glucose tolerance (oral): Secondary | ICD-10-CM

## 2013-11-10 DIAGNOSIS — E669 Obesity, unspecified: Secondary | ICD-10-CM

## 2013-11-10 DIAGNOSIS — E785 Hyperlipidemia, unspecified: Secondary | ICD-10-CM

## 2013-11-10 DIAGNOSIS — I498 Other specified cardiac arrhythmias: Secondary | ICD-10-CM

## 2013-11-10 DIAGNOSIS — E039 Hypothyroidism, unspecified: Secondary | ICD-10-CM

## 2013-11-10 DIAGNOSIS — K219 Gastro-esophageal reflux disease without esophagitis: Secondary | ICD-10-CM

## 2013-11-10 DIAGNOSIS — R7309 Other abnormal glucose: Secondary | ICD-10-CM

## 2013-11-10 DIAGNOSIS — R001 Bradycardia, unspecified: Secondary | ICD-10-CM

## 2013-11-10 DIAGNOSIS — M25561 Pain in right knee: Secondary | ICD-10-CM

## 2013-11-10 MED ORDER — PREDNISONE 5 MG PO TABS: 5.0000 mg | ORAL_TABLET | Freq: Two times a day (BID) | ORAL | Status: DC

## 2013-11-10 MED ORDER — IBUPROFEN 400 MG PO TABS: 400.0000 mg | ORAL_TABLET | Freq: Three times a day (TID) | ORAL | Status: DC

## 2013-11-10 MED ORDER — SPIRONOLACTONE 25 MG PO TABS: 25.0000 mg | ORAL_TABLET | Freq: Every day | ORAL | Status: DC

## 2013-11-10 NOTE — Assessment & Plan Note (Signed)
Improved. Pt applauded on succesful weight loss through lifestyle change, and encouraged to continue same. Weight loss goal set for the next several months.  

## 2013-11-10 NOTE — Assessment & Plan Note (Signed)
Slight deterioration as HBa1C has increase slightly Patient educated about the importance of limiting  Carbohydrate intake , the need to commit to daily physical activity for a minimum of 30 minutes , and to commit weight loss. The fact that changes in all these areas will reduce or eliminate all together the development of diabetes is stressed.

## 2013-11-10 NOTE — Patient Instructions (Signed)
F/u in 6 weeks, call if you need me before  New additional medication for your blood pressure, spironolactone 25mg  one daily  Depo medrol 80mg  iM in the office. Short 5 day course of ibuprofen 400mg  and prednisone 5 mg al;so sent in  Fasting lipid, cmp and CBC in 6 weeks before next visit

## 2013-11-10 NOTE — Assessment & Plan Note (Signed)
Pt placed on omeprazole for GERD by ENT

## 2013-11-10 NOTE — Assessment & Plan Note (Signed)
Improved with reduced dose of toprol

## 2013-11-10 NOTE — Assessment & Plan Note (Signed)
Uncontrolled, add spironolactone DASH diet and commitment to daily physical activity for a minimum of 30 minutes discussed and encouraged, as a part of hypertension management. The importance of attaining a healthy weight is also discussed.

## 2013-11-10 NOTE — Progress Notes (Signed)
   Subjective:    Patient ID: [name] [name], female    DOB: [birth year], 73 y.o.   MRN: [id number]  HPI /The PT is here for follow up and re-evaluation of chronic medical conditions, medication management and review of any available recent lab and radiology data.  Preventive health is updated, specifically  Cancer screening and Immunization.   Has been evaluated by cardiology who reduced metoprolol dose dueto bradycardia, and also by ENT who started her on PPI for GERD The PT denies any adverse reactions to current medications since the last visit.  Working on weight loss through dietary change with good success. 2 week h/o increased right knee pain no joint instability nooted       Review of Systems See HPI Denies recent fever or chills. Denies sinus pressure, nasal congestion, ear pain or sore throat. Denies chest congestion, productive cough or wheezing. Denies chest pains, palpitations and leg swelling Denies abdominal pain, nausea, vomiting,diarrhea or constipation.   Denies dysuria, frequency, hesitancy or incontinence. Denies headaches, seizures, numbness, or tingling. Denies depression, anxiety or insomnia. Denies skin break down or rash.        Objective:   Physical Exam  BP 166/70  Pulse 55  Resp 16  Wt 186 lb (84.369 kg)  SpO2 95% Patient alert and oriented and in no cardiopulmonary distress.  HEENT: No facial asymmetry, EOMI, no sinus tenderness,  oropharynx pink and moist.  Neck supple no adenopathy.  Chest: Clear to auscultation bilaterally.  CVS: S1, S2 no murmurs, no S3.  ABD: Soft non tender. Bowel sounds normal.  Ext: No edema  MS: Adequate ROM spine, shoulders, hips and reduced in right  knee.  Skin: Intact, no ulcerations or rash noted.  Psych: Good eye contact, normal affect. Memory intact not anxious or depressed appearing.  CNS: CN 2-12 intact, power, tone and sensation normal throughout.       Assessment & Plan:   HYPERTENSION Uncontrolled, add spironolactone DASH diet and commitment to daily physical activity for a minimum of 30 minutes discussed and encouraged, as a part of hypertension management. The importance of attaining a healthy weight is also discussed.   Bradycardia, sinus Improved with reduced dose of toprol  HYPOTHYROIDISM Controlled, no change in medication   IGT (impaired glucose tolerance) Slight deterioration as HBa1C has increase slightly Patient educated about the importance of limiting  Carbohydrate intake , the need to commit to daily physical activity for a minimum of 30 minutes , and to commit weight loss. The fact that changes in all these areas will reduce or eliminate all together the development of diabetes is stressed.     OBESITY Improved. Pt applauded on succesful weight loss through lifestyle change, and encouraged to continue same. Weight loss goal set for the next several months.   Knee pain, right Acute flare , toradol and depo medrol in office followed by short course of ibuprofen and prednisone  HYPERLIPIDEMIA lactose intolerance. Updated lab needed at/ before next visit.   GERD (gastroesophageal reflux disease) Pt placed on omeprazole for GERD by ENT

## 2013-11-10 NOTE — Assessment & Plan Note (Signed)
Acute flare , toradol and depo medrol in office followed by short course of ibuprofen and prednisone

## 2013-11-10 NOTE — Assessment & Plan Note (Signed)
Controlled, no change in medication  

## 2013-11-10 NOTE — Assessment & Plan Note (Signed)
lactose intolerance. Updated lab needed at/ before next visit.

## 2013-11-11 DIAGNOSIS — M25569 Pain in unspecified knee: Secondary | ICD-10-CM

## 2013-11-11 MED ORDER — KETOROLAC TROMETHAMINE 60 MG/2ML IM SOLN
60.0000 mg | Freq: Once | INTRAMUSCULAR | Status: AC
  Administered 2013-11-11: 60 mg via INTRAMUSCULAR

## 2013-11-11 MED ORDER — METHYLPREDNISOLONE ACETATE 80 MG/ML IJ SUSP
80.0000 mg | Freq: Once | INTRAMUSCULAR | Status: AC
  Administered 2013-11-11: 80 mg via INTRAMUSCULAR

## 2013-11-11 NOTE — Addendum Note (Signed)
Addended by: Eual Fines on: 11/11/2013 08:31 AM   Modules accepted: Orders

## 2013-11-15 NOTE — Addendum Note (Signed)
Addended by: Fayrene Helper on: 11/15/2013 09:24 PM   Modules accepted: Level of Service

## 2013-11-30 ENCOUNTER — Other Ambulatory Visit: Payer: Self-pay

## 2013-11-30 ENCOUNTER — Telehealth: Payer: Self-pay

## 2013-11-30 MED ORDER — FLUTICASONE PROPIONATE 50 MCG/ACT NA SUSP: 2.0000 | Freq: Every day | NASAL | Status: DC

## 2013-11-30 MED ORDER — ALBUTEROL SULFATE HFA 108 (90 BASE) MCG/ACT IN AERS: INHALATION_SPRAY | RESPIRATORY_TRACT | Status: DC

## 2013-11-30 NOTE — Telephone Encounter (Signed)
meds refilled 

## 2013-12-03 ENCOUNTER — Ambulatory Visit (HOSPITAL_COMMUNITY)
Admission: RE | Admit: 2013-12-03 | Discharge: 2013-12-03 | Disposition: A | Discharge status: Home / Self Care | Payer: Medicare Other | Source: Ambulatory Visit | Attending: Family Medicine | Admitting: Family Medicine

## 2013-12-03 DIAGNOSIS — Z1231 Encounter for screening mammogram for malignant neoplasm of breast: Secondary | ICD-10-CM | POA: Insufficient documentation

## 2013-12-03 DIAGNOSIS — Z139 Encounter for screening, unspecified: Secondary | ICD-10-CM

## 2013-12-22 ENCOUNTER — Other Ambulatory Visit: Payer: Self-pay | Admitting: Family Medicine

## 2013-12-31 LAB — CBC WITH DIFFERENTIAL/PLATELET
BASOS ABS: 0 10*3/uL (ref 0.0–0.1)
Basophils Relative: 0 % (ref 0–1)
EOS PCT: 5 % (ref 0–5)
Eosinophils Absolute: 0.4 10*3/uL (ref 0.0–0.7)
HEMATOCRIT: 34.2 % — AB (ref 36.0–46.0)
HEMOGLOBIN: 11.7 g/dL — AB (ref 12.0–15.0)
Lymphocytes Relative: 44 % (ref 12–46)
Lymphs Abs: 3.3 10*3/uL (ref 0.7–4.0)
MCH: 29.3 pg (ref 26.0–34.0)
MCHC: 34.2 g/dL (ref 30.0–36.0)
MCV: 85.7 fL (ref 78.0–100.0)
MONO ABS: 0.4 10*3/uL (ref 0.1–1.0)
MONOS PCT: 6 % (ref 3–12)
NEUTROS ABS: 3.3 10*3/uL (ref 1.7–7.7)
Neutrophils Relative %: 45 % (ref 43–77)
Platelets: 183 10*3/uL (ref 150–400)
RBC: 3.99 MIL/uL (ref 3.87–5.11)
RDW: 15 % (ref 11.5–15.5)
WBC: 7.4 10*3/uL (ref 4.0–10.5)

## 2013-12-31 LAB — COMPREHENSIVE METABOLIC PANEL
ALT: 22 U/L (ref 0–35)
AST: 24 U/L (ref 0–37)
Albumin: 4.3 g/dL (ref 3.5–5.2)
Alkaline Phosphatase: 71 U/L (ref 39–117)
BILIRUBIN TOTAL: 0.5 mg/dL (ref 0.2–1.2)
BUN: 22 mg/dL (ref 6–23)
CO2: 29 mEq/L (ref 19–32)
CREATININE: 1.24 mg/dL — AB (ref 0.50–1.10)
Calcium: 9.9 mg/dL (ref 8.4–10.5)
Chloride: 107 mEq/L (ref 96–112)
Glucose, Bld: 91 mg/dL (ref 70–99)
Potassium: 5.7 mEq/L — ABNORMAL HIGH (ref 3.5–5.3)
Sodium: 141 mEq/L (ref 135–145)
Total Protein: 7.7 g/dL (ref 6.0–8.3)

## 2013-12-31 LAB — LIPID PANEL
CHOLESTEROL: 149 mg/dL (ref 0–200)
HDL: 55 mg/dL (ref >39)
LDL Cholesterol: 70 mg/dL (ref 0–99)
TRIGLYCERIDES: 119 mg/dL (ref <150)
Total CHOL/HDL Ratio: 2.7 Ratio
VLDL: 24 mg/dL (ref 0–40)

## 2014-01-04 ENCOUNTER — Ambulatory Visit (INDEPENDENT_AMBULATORY_CARE_PROVIDER_SITE_OTHER): Payer: Medicare Other | Admitting: Family Medicine

## 2014-01-04 ENCOUNTER — Encounter (INDEPENDENT_AMBULATORY_CARE_PROVIDER_SITE_OTHER): Payer: Self-pay

## 2014-01-04 ENCOUNTER — Encounter: Payer: Self-pay | Admitting: Family Medicine

## 2014-01-04 VITALS — BP 180/80 | HR 60 | Resp 18 | Ht 61.0 in | Wt 185.0 lb

## 2014-01-04 DIAGNOSIS — R7302 Impaired glucose tolerance (oral): Secondary | ICD-10-CM

## 2014-01-04 DIAGNOSIS — Z23 Encounter for immunization: Secondary | ICD-10-CM

## 2014-01-04 DIAGNOSIS — Z951 Presence of aortocoronary bypass graft: Secondary | ICD-10-CM

## 2014-01-04 DIAGNOSIS — I10 Essential (primary) hypertension: Secondary | ICD-10-CM

## 2014-01-04 DIAGNOSIS — K219 Gastro-esophageal reflux disease without esophagitis: Secondary | ICD-10-CM

## 2014-01-04 DIAGNOSIS — E038 Other specified hypothyroidism: Secondary | ICD-10-CM

## 2014-01-04 DIAGNOSIS — R7309 Other abnormal glucose: Secondary | ICD-10-CM

## 2014-01-04 DIAGNOSIS — E785 Hyperlipidemia, unspecified: Secondary | ICD-10-CM

## 2014-01-04 DIAGNOSIS — E669 Obesity, unspecified: Secondary | ICD-10-CM

## 2014-01-04 NOTE — Patient Instructions (Addendum)
Annual physical exam in September mid Dose increase inn amlodipine to TWO 2.50m tabs every morning and continue the amlodipine 523mone daily as before,[id number] get leg swelling or feel badly resume previous dose.  If you tolerate this dose pls call and let me know so that I can change your dose to 12m71me daily  Prevnar today  Non fast chem 7 and eGFr, before Sept visit if possible  BP is still too high  Cholesterol and blood sugar are excellent.

## 2014-01-05 ENCOUNTER — Other Ambulatory Visit: Payer: Self-pay

## 2014-01-05 MED ORDER — OMEPRAZOLE 40 MG PO CPDR: 40.0000 mg | DELAYED_RELEASE_CAPSULE | Freq: Every day | ORAL | Status: DC

## 2014-01-05 MED ORDER — VALSARTAN-HYDROCHLOROTHIAZIDE 320-25 MG PO TABS: ORAL_TABLET | ORAL | Status: DC

## 2014-01-05 MED ORDER — ROSUVASTATIN CALCIUM 10 MG PO TABS: 10.0000 mg | ORAL_TABLET | Freq: Every day | ORAL | Status: DC

## 2014-01-07 ENCOUNTER — Telehealth: Payer: Self-pay | Admitting: Family Medicine

## 2014-01-07 DIAGNOSIS — Z13820 Encounter for screening for osteoporosis: Secondary | ICD-10-CM

## 2014-01-07 NOTE — Telephone Encounter (Signed)
Does patient need referral for bone density

## 2014-01-08 ENCOUNTER — Other Ambulatory Visit: Payer: Self-pay | Admitting: Family Medicine

## 2014-01-08 NOTE — Telephone Encounter (Signed)
Pls advise her that since she is not tolerating the higher dose of BP med , I will change her dosing on record, as  We had agreed at the visit. I THINK she need a dexa, cant see any on record , pls verify with her if this is the case, if so, pls order and send Sherry Ruffing a flag as she does not see that order, I will sign, thanks

## 2014-01-11 NOTE — Addendum Note (Signed)
Addended by: Eual Fines on: 01/11/2014 02:43 PM   Modules accepted: Orders

## 2014-01-11 NOTE — Telephone Encounter (Signed)
Bone density entered. Pt aware

## 2014-02-03 ENCOUNTER — Ambulatory Visit (HOSPITAL_COMMUNITY)
Admission: RE | Admit: 2014-02-03 | Discharge: 2014-02-03 | Disposition: A | Discharge status: Home / Self Care | Payer: Medicare Other | Source: Ambulatory Visit | Attending: Family Medicine | Admitting: Family Medicine

## 2014-02-03 DIAGNOSIS — Z13820 Encounter for screening for osteoporosis: Secondary | ICD-10-CM | POA: Diagnosis not present

## 2014-02-11 ENCOUNTER — Other Ambulatory Visit: Payer: Self-pay | Admitting: Family Medicine

## 2014-02-15 ENCOUNTER — Telehealth: Payer: Self-pay

## 2014-02-15 NOTE — Telephone Encounter (Signed)
I entered historically synthroid 69mcg take one ni the morning 30 min to 1 hr before breakfast on empty stonmach, she can check the cost if more affordable , this should be equivalent to what she is on. If she starts , will need rept TSH first week in October  If she agrees to try it , pls remove the armor thyroid from her med list also  ?? pls ask

## 2014-02-15 NOTE — Telephone Encounter (Signed)
Patient on amour thyroid 30.  Please advise.  Takes 2 daily

## 2014-02-16 ENCOUNTER — Other Ambulatory Visit: Payer: Self-pay

## 2014-02-16 MED ORDER — LEVOTHYROXINE SODIUM 50 MCG PO TABS: 50.0000 ug | ORAL_TABLET | Freq: Every day | ORAL | Status: DC

## 2014-02-16 NOTE — Telephone Encounter (Signed)
Patient aware of change in med.  Sent to pharmacy with request to not fill until due.  Patient will try to have lab done in October for TSH. Lab order to be mailed to patient mid September to be drawn at [facility].

## 2014-03-17 ENCOUNTER — Other Ambulatory Visit: Payer: Self-pay | Admitting: Family Medicine

## 2014-04-02 ENCOUNTER — Other Ambulatory Visit: Payer: Self-pay

## 2014-04-02 MED ORDER — ROSUVASTATIN CALCIUM 10 MG PO TABS: 10.0000 mg | ORAL_TABLET | Freq: Every day | ORAL | Status: DC

## 2014-04-12 ENCOUNTER — Telehealth: Payer: Self-pay

## 2014-04-12 NOTE — Telephone Encounter (Signed)
Med list faxed as requested.

## 2014-04-20 ENCOUNTER — Other Ambulatory Visit: Payer: Self-pay | Admitting: Family Medicine

## 2014-04-24 DIAGNOSIS — Z23 Encounter for immunization: Secondary | ICD-10-CM | POA: Insufficient documentation

## 2014-04-24 NOTE — Assessment & Plan Note (Addendum)
Uncontrolled, despite multiple meds which pt sttaes she takes regularly as [prescreibed States BP is "good at home" DASH diet and commitment to daily physical activity for a minimum of 30 minutes discussed and encouraged, as a part of hypertension management. The importance of attaining a healthy weight is also discussed. Dose of amodipine is increased at visit if pt able to tolerate

## 2014-04-24 NOTE — Assessment & Plan Note (Signed)
Denies current chest pain or decreased exercise tolerance, currently sytable and followed by cardiology

## 2014-04-24 NOTE — Assessment & Plan Note (Signed)
Unchanged. Patient re-educated about  the importance of commitment to a  minimum of 150 minutes of exercise per week. The importance of healthy food choices with portion control discussed. Encouraged to start a food diary, count calories and to consider  joining a support group. Sample diet sheets offered. Goals set by the patient for the next several months.    

## 2014-04-24 NOTE — Assessment & Plan Note (Signed)
Patient educated about the importance of limiting  Carbohydrate intake , the need to commit to daily physical activity for a minimum of 30 minutes , and to commit weight loss. The fact that changes in all these areas will reduce or eliminate all together the development of diabetes is stressed.   Improved HBA1C pt applauded on this and encouraged to continue same

## 2014-04-24 NOTE — Assessment & Plan Note (Signed)
Controlled, no change in medication Updated lab needed at/ before next visit.  

## 2014-04-24 NOTE — Progress Notes (Signed)
Subjective:    Patient ID: [name] [name], female    DOB: Apr 30, 1941, 73 y.o.   MRN: [id number]  HPI The PT is here for follow up and re-evaluation of chronic medical conditions, medication management and review of any available recent lab and radiology data.  Preventive health is updated, specifically  Cancer screening and Immunization.   Questions or concerns regarding consultations or procedures which the PT has had in the interim are  addressed. The PT denies any adverse reactions to current medications since the last visit.  There are no new concerns.  There are no specific complaints       Review of Systems See HPI Denies recent fever or chills. Denies sinus pressure, nasal congestion, ear pain or sore throat. Denies chest congestion, productive cough or wheezing. Denies chest pains, palpitations and leg swelling Denies abdominal pain, nausea, vomiting,diarrhea or constipation.   Denies dysuria, frequency, hesitancy or incontinence. Denies joint pain, swelling and limitation in mobility. Denies headaches, seizures, numbness, or tingling. Denies depression, anxiety or insomnia. Denies skin break down or rash.        Objective:   Physical Exam BP 180/80  Pulse 60  Resp 18  Ht 5\' 1"  (1.549 m)  Wt 185 lb (83.915 kg)  BMI 34.97 kg/m2  SpO2 100% Patient alert and oriented and in no cardiopulmonary distress.  HEENT: No facial asymmetry, EOMI,   oropharynx pink and moist.  Neck supple no JVD, no mass.  Chest: Clear to auscultation bilaterally.  CVS: S1, S2 no murmurs, no S3.Regular rate.  ABD: Soft non tender.   Ext: No edema  MS: Adequate ROM spine, shoulders, hips and knees.  Skin: Intact, no ulcerations or rash noted.  Psych: Good eye contact, normal affect. Memory intact not anxious or depressed appearing.  CNS: CN 2-12 intact, power,  normal throughout.no focal deficits noted.      Assessment & Plan:  Essential hypertension Uncontrolled,  despite multiple meds which pt sttaes she takes regularly as [prescreibed States BP is "good at home" DASH diet and commitment to daily physical activity for a minimum of 30 minutes discussed and encouraged, as a part of hypertension management. The importance of attaining a healthy weight is also discussed. Dose of amodipine is increased at visit if pt able to tolerate   GERD (gastroesophageal reflux disease) Improved on medication, avoidance of caffeine is stressed  Hypothyroidism Controlled, no change in medication Updated lab needed at/ before next visit.   IGT (impaired glucose tolerance) Patient educated about the importance of limiting  Carbohydrate intake , the need to commit to daily physical activity for a minimum of 30 minutes , and to commit weight loss. The fact that changes in all these areas will reduce or eliminate all together the development of diabetes is stressed.   Improved HBA1C pt applauded on this and encouraged to continue same  Hyperlipidemia LDL goal <70 Controlled, no change in medication Hyperlipidemia:Low fat diet discussed and encouraged.    Obesity, Class I, BMI 30-34.9 Unchanged Patient re-educated about  the importance of commitment to a  minimum of 150 minutes of exercise per week. The importance of healthy food choices with portion control discussed. Encouraged to start a food diary, count calories and to consider  joining a support group. Sample diet sheets offered. Goals set by the patient for the next several months.     CORONARY ARTERY BYPASS GRAFT, HX OF Denies current chest pain or decreased exercise tolerance, currently sytable and followed by  cardiology

## 2014-04-24 NOTE — Assessment & Plan Note (Signed)
Controlled, no change in medication Hyperlipidemia:Low fat diet discussed and encouraged.  \ 

## 2014-04-24 NOTE — Assessment & Plan Note (Signed)
Improved on medication, avoidance of caffeine is stressed

## 2014-05-11 ENCOUNTER — Other Ambulatory Visit: Payer: Self-pay | Admitting: Family Medicine

## 2014-05-11 LAB — COMPLETE METABOLIC PANEL WITH GFR
ALBUMIN: 4.4 g/dL (ref 3.5–5.2)
ALK PHOS: 73 U/L (ref 39–117)
ALT: 14 U/L (ref 0–35)
AST: 19 U/L (ref 0–37)
BUN: 18 mg/dL (ref 6–23)
CO2: 24 meq/L (ref 19–32)
Calcium: 10.1 mg/dL (ref 8.4–10.5)
Chloride: 108 mEq/L (ref 96–112)
Creat: 1.16 mg/dL — ABNORMAL HIGH (ref 0.50–1.10)
GFR, Est African American: 55 mL/min — ABNORMAL LOW
GFR, Est Non African American: 47 mL/min — ABNORMAL LOW
GLUCOSE: 86 mg/dL (ref 70–99)
POTASSIUM: 4.3 meq/L (ref 3.5–5.3)
SODIUM: 140 meq/L (ref 135–145)
TOTAL PROTEIN: 7.7 g/dL (ref 6.0–8.3)
Total Bilirubin: 0.5 mg/dL (ref 0.2–1.2)

## 2014-05-14 ENCOUNTER — Other Ambulatory Visit: Payer: Self-pay | Admitting: Family Medicine

## 2014-05-17 ENCOUNTER — Encounter: Payer: Self-pay | Admitting: Family Medicine

## 2014-05-17 ENCOUNTER — Ambulatory Visit (INDEPENDENT_AMBULATORY_CARE_PROVIDER_SITE_OTHER): Payer: Medicare Other | Admitting: Family Medicine

## 2014-05-17 ENCOUNTER — Other Ambulatory Visit: Payer: Self-pay

## 2014-05-17 ENCOUNTER — Ambulatory Visit (INDEPENDENT_AMBULATORY_CARE_PROVIDER_SITE_OTHER): Payer: Medicare Other

## 2014-05-17 VITALS — BP 160/70 | HR 53 | Resp 16 | Ht 61.0 in | Wt 193.0 lb

## 2014-05-17 DIAGNOSIS — Z0000 Encounter for general adult medical examination without abnormal findings: Secondary | ICD-10-CM

## 2014-05-17 DIAGNOSIS — Z23 Encounter for immunization: Secondary | ICD-10-CM

## 2014-05-17 DIAGNOSIS — Z79899 Other long term (current) drug therapy: Secondary | ICD-10-CM

## 2014-05-17 DIAGNOSIS — E785 Hyperlipidemia, unspecified: Secondary | ICD-10-CM

## 2014-05-17 DIAGNOSIS — R7302 Impaired glucose tolerance (oral): Secondary | ICD-10-CM

## 2014-05-17 DIAGNOSIS — Z1211 Encounter for screening for malignant neoplasm of colon: Secondary | ICD-10-CM

## 2014-05-17 DIAGNOSIS — E038 Other specified hypothyroidism: Secondary | ICD-10-CM

## 2014-05-17 DIAGNOSIS — I10 Essential (primary) hypertension: Secondary | ICD-10-CM

## 2014-05-17 LAB — TSH: TSH: 4.916 u[IU]/mL — AB (ref 0.350–4.500)

## 2014-05-17 LAB — POC HEMOCCULT BLD/STL (OFFICE/1-CARD/DIAGNOSTIC): Card #1 Date: NEGATIVE

## 2014-05-17 MED ORDER — LEVOTHYROXINE SODIUM 50 MCG PO TABS: 50.0000 ug | ORAL_TABLET | Freq: Every day | ORAL | Status: DC

## 2014-05-17 MED ORDER — VALSARTAN-HYDROCHLOROTHIAZIDE 320-25 MG PO TABS: ORAL_TABLET | ORAL | Status: DC

## 2014-05-17 MED ORDER — OMEPRAZOLE 40 MG PO CPDR
40.0000 mg | DELAYED_RELEASE_CAPSULE | Freq: Every day | ORAL | Status: DC
Start: 2014-05-17 — End: 2014-09-13

## 2014-05-17 NOTE — Patient Instructions (Addendum)
F/u  With rectal in 4 month, call if you need me before   Flu vaccine today  Please work on living will   Please work on home safety  Fall Prevention and [place] cause injuries and can affect all age groups. It is possible to prevent falls.  HOW TO PREVENT FALLS  Wear shoes with rubber soles that do not have an opening for your toes.  Keep the inside and outside of your house well lit.  Use night lights throughout your home.  Remove clutter from floors.  Clean up floor spills.  Remove throw rugs or fasten them to the floor with carpet tape.  Do not place electrical cords across pathways.  Put grab bars by your tub, shower, and toilet. Do not use towel bars as grab bars.  Put handrails on both sides of the stairway. Fix loose handrails.  Do not climb on stools or stepladders, if possible.  Do not wax your floors.  Repair uneven or unsafe sidewalks, walkways, or stairs.  Keep items you use a lot within reach.  Be aware of pets.  Keep emergency numbers next to the telephone.  Put smoke detectors in your home and near bedrooms. Ask your doctor what other things you can do to prevent falls. Document Released: 04/14/2009 Document Revised: 12/18/2011 Document Reviewed: 09/18/2011 [facility] Patient Information 2015 [place], [place]. This information is not intended to replace advice given to you by your health care provider. Make sure you discuss any questions you have with your health care provider.   Fasting lipid, cmp and EGFR and hBA1C and TSH in 4 month

## 2014-05-17 NOTE — Progress Notes (Signed)
Subjective:    Patient ID: [name] [name], female    DOB: May 14, 1941, 73 y.o.   MRN: [id number]  HPI Preventive Screening-Counseling & Management   Patient present here today for a Medicare annual wellness visit.   Current Problems (verified)   Medications Prior to Visit Allergies (verified)   PAST HISTORY  Family History (verified)   Social History Married 97 years, has 1 child, had heart attack at age 2 and had to retire    Risk Factors  Current exercise habits: walks almost daily, stays busy around the house   Dietary issues discussed: Heart healthy diet discussed- eating more fruits and vegetables and limiting red meats and fried foods   Cardiac risk factors:   Depression Screen  (Note: if answer to either of the following is "Yes", a more complete depression screening is indicated)   Over the past two weeks, have you felt down, depressed or hopeless? Sometimes   Over the past two weeks, have you felt little interest or pleasure in doing things? No  Have you lost interest or pleasure in daily life? No  Do you often feel hopeless? No  Do you cry easily over simple problems? No   Activities of Daily Living  In your present state of health, do you have any difficulty performing the following activities?  Driving?: yes, limited driving since she got a ticket for speeding approx 10 years ago Managing money?: No Feeding yourself?:No Getting from bed to chair?:No Climbing a flight of stairs?:No Preparing food and eating?:No Bathing or showering?:No Getting dressed?:No Getting to the toilet?:No Using the toilet?:No Moving around from place to place?: No  Fall Risk Assessment In the past year have you fallen or had a near fall?:No, experiences catches in right hip Are you currently taking any medications that make you dizzy?:No   Hearing Difficulties: No Do you often ask people to speak up or repeat themselves?: no   Do you experience ringing or noises in  your ears?:No Do you have difficulty understanding soft or whispered voices?:No  Cognitive Testing  Alert? Yes Normal Appearance?Yes  Oriented to person? Yes Place? Yes  Time? Yes  Displays appropriate judgment?Yes  Can read the correct time from a watch face? yes Are you having problems remembering things?No  Advanced Directives have been discussed with the patient?Yes, brochure given, does not currently have a living will    List the Names of Other Physician/Practitioners you currently use:  Dr Ron Parker (cardio)  Dr Katy Fitch( eye Dr)   [name] [name] any recent Medical Services you may have received from other than [facility] providers in the past year (date may be approximate).   Assessment:    Annual Wellness Exam   Plan:    [place]  I have personally reviewed:  The patient's medical and social history  Their use of alcohol, tobacco or illicit drugs  Their current medications and supplements  The patient's functional ability including ADLs,fall risks, home safety risks, cognitive, and hearing and visual impairment  Diet and physical activities  Evidence for depression or mood disorders  The patient's weight, height, BMI, and visual acuity have been recorded in the chart. I have made referrals, counseling, and provided education to the patient based on review of the above and I have provided the patient with a written personalized care plan for preventive services.      Review of Systems     Objective:   Physical Exam  BP 160/70 mmHg  Pulse 53  Resp 16  Ht 5\' 1"  (1.549 m)  Wt 193 lb (87.544 kg)  BMI 36.49 kg/m2  SpO2 98%   Rectal: no mass, small hemorhoids, heme negative stool      Assessment & Plan:  Medicare annual wellness visit, subsequent Annual exam as documented. Counseling done  re healthy lifestyle involving commitment to 150 minutes exercise per week, heart healthy diet, and attaining healthy weight.The importance of adequate sleep also  discussed. Regular seat belt use and home safety, is also discussed. Changes in health habits are decided on by the patient with  Immunization and cancer screening needs are specifically addressed at this visit. End of life issues and actually preparing a  Living will is encouraged   Need for prophylactic vaccination and inoculation against influenza Vaccine administered at visit.   Special screening for malignant neoplasms, colon Rectal exam : no mass, heme negative stool

## 2014-05-17 NOTE — Assessment & Plan Note (Signed)
Vaccine administered at visit.  

## 2014-05-17 NOTE — Assessment & Plan Note (Addendum)
Rectal exam : no mass, heme negative stool, small hemorhoids

## 2014-05-17 NOTE — Assessment & Plan Note (Signed)
Annual exam as documented. Counseling done  re healthy lifestyle involving commitment to 150 minutes exercise per week, heart healthy diet, and attaining healthy weight.The importance of adequate sleep also discussed. Regular seat belt use and home safety, is also discussed. Changes in health habits are decided on by the patient with  Immunization and cancer screening needs are specifically addressed at this visit. End of life issues and actually preparing a  Living will is encouraged

## 2014-05-21 ENCOUNTER — Other Ambulatory Visit: Payer: Self-pay

## 2014-05-21 MED ORDER — LEVOTHYROXINE SODIUM 50 MCG PO TABS: ORAL_TABLET | ORAL | Status: DC

## 2014-06-18 ENCOUNTER — Telehealth: Payer: Self-pay | Admitting: Family Medicine

## 2014-06-18 ENCOUNTER — Other Ambulatory Visit: Payer: Self-pay

## 2014-06-18 MED ORDER — PROMETHAZINE-DM 6.25-15 MG/5ML PO SYRP: 5.0000 mL | ORAL_SOLUTION | Freq: Every evening | ORAL | Status: DC | PRN

## 2014-06-18 MED ORDER — PREDNISONE 5 MG PO TABS: 5.0000 mg | ORAL_TABLET | Freq: Two times a day (BID) | ORAL | Status: DC

## 2014-06-18 NOTE — Telephone Encounter (Signed)
Patient hoarse and coughing up clear/white phlegm x 2 days. Coughed so hard last night she couldn't sleep. No fever. States she has not taken anything otc because she has no way to go to the store. If something is called into her pharmacy, they will deliver. Please advise

## 2014-06-18 NOTE — Telephone Encounter (Signed)
Sent and husband aware

## 2014-06-18 NOTE — Telephone Encounter (Signed)
Prednisone 5mg  twice daily for 5 days, and phenergan dm 5cc at bedtime as needed for excess cough x 120 cc , pls send and let her know

## 2014-07-03 ENCOUNTER — Other Ambulatory Visit: Payer: Self-pay | Admitting: Family Medicine

## 2014-07-05 ENCOUNTER — Telehealth: Payer: Self-pay | Admitting: Family Medicine

## 2014-07-05 NOTE — Telephone Encounter (Signed)
90 day supply sent to pharmacy

## 2014-07-19 ENCOUNTER — Other Ambulatory Visit: Payer: Self-pay | Admitting: Family Medicine

## 2014-08-11 ENCOUNTER — Encounter: Payer: Self-pay | Admitting: Cardiology

## 2014-08-11 ENCOUNTER — Ambulatory Visit (INDEPENDENT_AMBULATORY_CARE_PROVIDER_SITE_OTHER): Payer: Medicare Other | Admitting: Cardiology

## 2014-08-11 VITALS — BP 150/62 | HR 50 | Ht 61.0 in | Wt 198.0 lb

## 2014-08-11 DIAGNOSIS — I779 Disorder of arteries and arterioles, unspecified: Secondary | ICD-10-CM | POA: Diagnosis not present

## 2014-08-11 DIAGNOSIS — R001 Bradycardia, unspecified: Secondary | ICD-10-CM

## 2014-08-11 DIAGNOSIS — I25810 Atherosclerosis of coronary artery bypass graft(s) without angina pectoris: Secondary | ICD-10-CM

## 2014-08-11 DIAGNOSIS — I10 Essential (primary) hypertension: Secondary | ICD-10-CM

## 2014-08-11 DIAGNOSIS — I739 Peripheral vascular disease, unspecified: Secondary | ICD-10-CM

## 2014-08-11 NOTE — Assessment & Plan Note (Signed)
Coronary disease is stable. I have not done an exercise test since 2009. She is not having any significant symptoms. She is on appropriate medications. No change in therapy.

## 2014-08-11 NOTE — Patient Instructions (Signed)
Your physician recommends that you schedule a follow-up appointment in: 1 year. You will receive a reminder letter in the mail in about 10 months reminding you to call and schedule your appointment. If you don't receive this letter, please contact our office. Your physician recommends that you continue on your current medications as directed. Please refer to the Current Medication list given to you today. Your physician has requested that you have a carotid duplex. This test is an ultrasound of the carotid arteries in your neck. It looks at blood flow through these arteries that supply the brain with blood. Allow one hour for this exam. There are no restrictions or special instructions.  

## 2014-08-11 NOTE — Assessment & Plan Note (Signed)
Patient has chronic mild sinus bradycardia. It is asymptomatic. No change in therapy.

## 2014-08-11 NOTE — Assessment & Plan Note (Signed)
The patient has bilateral carotid disease. Her last Doppler was April, 2014. Arrangements are being made for a follow-up Doppler.  The patient will be seen back in our office in 1 year. She is aware that I'm retiring. I made sure that she knows that our team will continue to care for her very carefully.

## 2014-08-11 NOTE — Progress Notes (Signed)
Cardiology Office Note   Date:  08/11/2014   ID:  [name], [name] Feb 03, 1941, MRN [id number]  PCP:  Tula Nakayama, MD  Cardiologist:  Dola Argyle, MD   Chief Complaint  Patient presents with  . Appointment    Follow-up coronary artery disease      History of Present Illness: [name] [name] is a 74 y.o. female who presents today to follow-up coronary disease. She underwent bypass surgery in 2005. She's done very well. Nuclear study in 2009 revealed normal ejection fraction and no ischemia. She has not had exercise testing since then. She is not having any significant shortness of breath or chest pain. She has carotid disease. It is time for follow-up carotid Doppler.    Past Medical History  Diagnosis Date  . Obesity   . 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 Hyperlipidemia   . Hypertension   . Glaucoma dx 2010    had laser to both eyes in 2010  . Hypothyroidism   . Renal insufficiency, mild   . Carotid artery disease 11/2008    doppler 11/2008.Marland KitchenMarland Kitchen50% RICA / doppler 03/2010.Marland KitchenMarland KitchenMarland Kitchen60-79% RICA ...less than [id number] LICA.Marland KitchenMarland KitchenPlan   . Hx of CABG     2005  . Ejection fraction     EF 70%, nuclear, July, 2009     Past Surgical History  Procedure Laterality Date  . Coronary artery bypass graft x4 (10/05)    . Coronary artery bypass graft  2004    [facility]  . Colonoscopy N/A 02/04/2013    Procedure: COLONOSCOPY;  Surgeon: Rogene Houston, MD;  Location: [facility];  Service: Endoscopy;  Laterality: N/A;  1015    Patient Active Problem List   Diagnosis Date Noted  . Ejection fraction     Priority: High  . CORONARY ARTERY BYPASS GRAFT, HX OF 01/31/2009    Priority: High  . Medicare annual wellness visit, subsequent 05/17/2014  . Need for prophylactic vaccination and inoculation against influenza 05/17/2014  . Special screening for malignant neoplasms, colon 05/17/2014  . Need for vaccination with  13-polyvalent pneumococcal conjugate vaccine 04/24/2014  . Knee pain, right 11/10/2013  . GERD (gastroesophageal reflux disease) 11/10/2013  . Bradycardia, sinus 08/05/2013  . Metabolic syndrome X 99991111  . Zoster 10/24/2011  . CAD (coronary artery disease)   . CKD (chronic kidney disease) 07/11/2011  . Hypothyroidism 01/31/2009  . RENAL INSUFFICIENCY 01/31/2009  . Carotid artery disease 11/30/2008  . IGT (impaired glucose tolerance) 11/28/2008  . ALLERGIC RHINITIS CAUSE UNSPECIFIED 08/12/2008  . Hyperlipidemia LDL goal <70 10/07/2007  . Obesity, Class I, BMI 30-34.9 10/07/2007  . Essential hypertension 10/07/2007      Current Outpatient Prescriptions  Medication Sig Dispense Refill  . acetaminophen (TYLENOL) 500 MG tablet Take 1,000 mg by mouth every 6 (six) hours as needed for pain.    Marland Kitchen albuterol (PROAIR HFA) 108 (90 BASE) MCG/ACT inhaler INHALE ONE PUFF EVERY FOUR HOURS AS NEEDED. 8.5 g 3  . amLODipine (NORVASC) 5 MG tablet TAKE ONE TABLET BY MOUTH DAILY 90 tablet 1  . aspirin 81 MG tablet Take 81 mg by mouth daily.    . Cholecalciferol (VITAMIN D3) 1000 UNITS CAPS Take 1 capsule by mouth daily.    . fish oil-omega-3 fatty acids 1000 MG capsule Take 2 g by mouth daily.    . fluticasone (FLONASE) 50 MCG/ACT nasal spray Place 2 sprays into both nostrils  daily. 16 g 1  . ipratropium-albuterol (DUONEB) 0.5-2.5 (3) MG/3ML SOLN Take 3 mLs by nebulization every 8 (eight) hours as needed. 270 mL 0  . latanoprost (XALATAN) 0.005 % ophthalmic solution Place 1 drop into both eyes at bedtime. One drop each eye at bedtime     . levothyroxine (SYNTHROID, LEVOTHROID) 50 MCG tablet One (1) tablet on Monday-Friday and One and One-half (1 1/2) on Saturday and Sunday 34 tablet 2  . metoprolol succinate (TOPROL-XL) 100 MG 24 hr tablet Take 1 tablet (100 mg total) by mouth daily. Take with or immediately following a meal. 90 tablet 3  . Multiple Vitamins-Minerals (THERATRUM COMPLETE 50 PLUS PO)  Take by mouth. Take one tablet by mouth daily      . omeprazole (PRILOSEC) 40 MG capsule Take 1 capsule (40 mg total) by mouth daily. 30 capsule 3  . QC ALL DAY ALLERGY 10 MG tablet TAKE ONE TABLET BY MOUTH ONCE DAILY 90 tablet 1  . rosuvastatin (CRESTOR) 10 MG tablet Take 1 tablet (10 mg total) by mouth daily. 90 tablet 1  . spironolactone (ALDACTONE) 25 MG tablet TAKE ONE TABLET BY MOUTH ONCE DAILY 30 tablet 3  . valsartan-hydrochlorothiazide (DIOVAN-HCT) 320-25 MG per tablet TAKE ONE TABLET BY MOUTH DAILY 90 tablet 1   No current facility-administered medications for this visit.    Allergies:   Clonidine derivatives    Social History:  The patient  reports that she quit smoking about 11 years ago. Her smoking use included Cigarettes. She has a 12 pack-year smoking history. She has never used smokeless tobacco. She reports that she does not drink alcohol or use illicit drugs.   Family History:  The patient's family history includes Aneurysm in her mother; Cancer (age of onset: 39) in her sister; Cancer (age of onset: 38) in her brother; Colon cancer (age of onset: 14) in her brother; Coronary artery disease in her father, mother, and sister; Diabetes in her sister, sister, sister, and son; Hearing loss in her sister; Hypertension in her mother and sister.    ROS:  Please see the history of present illness.   Patient denies fever, chills, headache, sweats, rash, change in vision, change in hearing, chest pain, cough, nausea or vomiting, urinary symptoms. All other systems are reviewed and are negative.     PHYSICAL EXAM: VS:  BP 150/62 mmHg  Pulse 50  Ht 5\' 1"  (1.549 m)  Wt 198 lb (89.812 kg)  BMI 37.43 kg/m2  SpO2 99% , Patient is overweight. She is stable. She is oriented to person time and place. Affect is normal. Head is atraumatic. Sclera and conjunctiva are normal. There is no jugular venous distention. Lungs are clear. Respiratory effort is nonlabored. Cardiac exam reveals S1 and  S2. Abdomen is soft. There is no peripheral edema. There are no musculoskeletal deformities. There are no skin rashes. Neurologic is grossly intact.  EKG:   EKG is done today and reviewed by me. She has sinus bradycardia. There is increased voltage. There are old diffuse ST-T wave abnormalities. There is no significant change.   Recent Labs: 12/31/2013: Hemoglobin 11.7*; Platelets 183 05/11/2014: ALT 14; BUN 18; Creatinine 1.16*; Potassium 4.3; Sodium 140; TSH 4.916*    Lipid Panel    Component Value Date/Time   CHOL 149 12/31/2013 0926   TRIG 119 12/31/2013 0926   HDL 55 12/31/2013 0926   CHOLHDL 2.7 12/31/2013 0926   VLDL 24 12/31/2013 0926   LDLCALC 70 12/31/2013 0926  Wt Readings from Last 3 Encounters:  08/11/14 198 lb (89.812 kg)  05/17/14 193 lb (87.544 kg)  01/04/14 185 lb (83.915 kg)      Current medicines are reviewed. The patient understands her medications.      ASSESSMENT AND PLAN:

## 2014-08-12 ENCOUNTER — Encounter (INDEPENDENT_AMBULATORY_CARE_PROVIDER_SITE_OTHER): Payer: Medicare Other

## 2014-08-12 DIAGNOSIS — I6523 Occlusion and stenosis of bilateral carotid arteries: Secondary | ICD-10-CM

## 2014-08-12 DIAGNOSIS — I739 Peripheral vascular disease, unspecified: Principal | ICD-10-CM

## 2014-08-12 DIAGNOSIS — I779 Disorder of arteries and arterioles, unspecified: Secondary | ICD-10-CM

## 2014-08-30 ENCOUNTER — Other Ambulatory Visit: Payer: Self-pay

## 2014-08-30 MED ORDER — LEVOTHYROXINE SODIUM 50 MCG PO TABS: ORAL_TABLET | ORAL | Status: DC

## 2014-08-31 ENCOUNTER — Telehealth: Payer: Self-pay | Admitting: *Deleted

## 2014-08-31 ENCOUNTER — Other Ambulatory Visit: Payer: Self-pay | Admitting: Family Medicine

## 2014-08-31 NOTE — Telephone Encounter (Signed)
Pt called stating pharmacy did not get her refill. Please advise

## 2014-08-31 NOTE — Telephone Encounter (Signed)
Med refilled today and 2/29

## 2014-09-06 ENCOUNTER — Other Ambulatory Visit: Payer: Self-pay

## 2014-09-06 MED ORDER — CETIRIZINE HCL 10 MG PO TABS: 10.0000 mg | ORAL_TABLET | Freq: Every day | ORAL | Status: DC

## 2014-09-07 DIAGNOSIS — R7302 Impaired glucose tolerance (oral): Secondary | ICD-10-CM | POA: Diagnosis not present

## 2014-09-07 DIAGNOSIS — E785 Hyperlipidemia, unspecified: Secondary | ICD-10-CM | POA: Diagnosis not present

## 2014-09-07 DIAGNOSIS — I10 Essential (primary) hypertension: Secondary | ICD-10-CM | POA: Diagnosis not present

## 2014-09-08 LAB — COMPLETE METABOLIC PANEL WITH GFR
ALK PHOS: 67 U/L (ref 39–117)
ALT: 16 U/L (ref 0–35)
AST: 20 U/L (ref 0–37)
Albumin: 4.5 g/dL (ref 3.5–5.2)
BILIRUBIN TOTAL: 0.4 mg/dL (ref 0.2–1.2)
BUN: 28 mg/dL — AB (ref 6–23)
CO2: 25 mEq/L (ref 19–32)
CREATININE: 1.27 mg/dL — AB (ref 0.50–1.10)
Calcium: 9.9 mg/dL (ref 8.4–10.5)
Chloride: 109 mEq/L (ref 96–112)
GFR, Est African American: 49 mL/min — ABNORMAL LOW
GFR, Est Non African American: 42 mL/min — ABNORMAL LOW
GLUCOSE: 93 mg/dL (ref 70–99)
POTASSIUM: 5.2 meq/L (ref 3.5–5.3)
SODIUM: 142 meq/L (ref 135–145)
Total Protein: 7.8 g/dL (ref 6.0–8.3)

## 2014-09-08 LAB — LIPID PANEL
CHOL/HDL RATIO: 3.3 ratio
Cholesterol: 149 mg/dL (ref 0–200)
HDL: 45 mg/dL — ABNORMAL LOW (ref >=46)
LDL Cholesterol: 79 mg/dL (ref 0–99)
Triglycerides: 126 mg/dL (ref <150)
VLDL: 25 mg/dL (ref 0–40)

## 2014-09-08 LAB — TSH: TSH: 7.734 u[IU]/mL — ABNORMAL HIGH (ref 0.350–4.500)

## 2014-09-08 LAB — HEMOGLOBIN A1C
Hgb A1c MFr Bld: 6.3 % — ABNORMAL HIGH (ref <5.7)
MEAN PLASMA GLUCOSE: 134 mg/dL — AB (ref <117)

## 2014-09-13 ENCOUNTER — Encounter: Payer: Self-pay | Admitting: Family Medicine

## 2014-09-13 ENCOUNTER — Ambulatory Visit (INDEPENDENT_AMBULATORY_CARE_PROVIDER_SITE_OTHER): Payer: Medicare Other | Admitting: Family Medicine

## 2014-09-13 VITALS — BP 142/74 | HR 54 | Resp 16 | Ht 61.0 in | Wt 199.0 lb

## 2014-09-13 DIAGNOSIS — I10 Essential (primary) hypertension: Secondary | ICD-10-CM

## 2014-09-13 DIAGNOSIS — R7302 Impaired glucose tolerance (oral): Secondary | ICD-10-CM

## 2014-09-13 DIAGNOSIS — J3089 Other allergic rhinitis: Secondary | ICD-10-CM

## 2014-09-13 DIAGNOSIS — I2510 Atherosclerotic heart disease of native coronary artery without angina pectoris: Secondary | ICD-10-CM

## 2014-09-13 DIAGNOSIS — E038 Other specified hypothyroidism: Secondary | ICD-10-CM

## 2014-09-13 DIAGNOSIS — E785 Hyperlipidemia, unspecified: Secondary | ICD-10-CM | POA: Diagnosis not present

## 2014-09-13 DIAGNOSIS — K219 Gastro-esophageal reflux disease without esophagitis: Secondary | ICD-10-CM

## 2014-09-13 MED ORDER — VALSARTAN-HYDROCHLOROTHIAZIDE 320-25 MG PO TABS: ORAL_TABLET | ORAL | Status: DC

## 2014-09-13 MED ORDER — OMEPRAZOLE 40 MG PO CPDR: 40.0000 mg | DELAYED_RELEASE_CAPSULE | Freq: Every day | ORAL | Status: DC

## 2014-09-13 MED ORDER — SPIRONOLACTONE 25 MG PO TABS: 25.0000 mg | ORAL_TABLET | Freq: Every day | ORAL | Status: DC

## 2014-09-13 MED ORDER — LEVOTHYROXINE SODIUM 75 MCG PO TABS: 75.0000 ug | ORAL_TABLET | Freq: Every day | ORAL | Status: DC

## 2014-09-13 NOTE — Progress Notes (Signed)
Subjective:    Patient ID: [name] [name], female    DOB: 1940/09/28, 74 y.o.   MRN: [id number]  HPI    [name] [name]     MRN: [id number]      DOB: October 02, 1940   HPI [name] [name] is here for follow up and re-evaluation of chronic medical conditions, medication management and review of any available recent lab and radiology data.  Preventive health is updated, specifically  Cancer screening and Immunization.   Questions or concerns regarding consultations or procedures which the PT has had in the interim are  addressed. The PT denies any adverse reactions to current medications since the last visit.  There are no new concerns.  There are no specific complaints   ROS Denies recent fever or chills. Denies sinus pressure, nasal congestion, ear pain or sore throat. Denies chest congestion, productive cough or wheezing. Denies chest pains, palpitations and leg swelling Denies abdominal pain, nausea, vomiting,diarrhea or constipation.   Denies dysuria, frequency, hesitancy or incontinence. Denies joint pain, swelling and limitation in mobility. Denies headaches, seizures, numbness, or tingling. Denies depression, anxiety or insomnia. Denies skin break down or rash.   PE  BP 142/74 mmHg  Pulse 54  Resp 16  Ht 5\' 1"  (1.549 m)  Wt 199 lb (90.266 kg)  BMI 37.62 kg/m2  SpO2 97%  Patient alert and oriented and in no cardiopulmonary distress.  HEENT: No facial asymmetry, EOMI,   oropharynx pink and moist.  Neck supple no JVD, no mass.  Chest: Clear to auscultation bilaterally.  CVS: S1, S2 no murmurs, no S3.Regular rate.  ABD: Soft non tender.   Ext: No edema  MS: Adequate ROM spine, shoulders, hips and knees.  Skin: Intact, no ulcerations or rash noted.  Psych: Good eye contact, normal affect. Memory intact not anxious or depressed appearing.  CNS: CN 2-12 intact, power,  normal throughout.no focal deficits noted.   Assessment & Plan   Essential hypertension Sub  optimal control, no change in medication as pt has been intolerant of increase in med dose and is on multiple medications. DASH diet and commitment to daily physical activity for a minimum of 30 minutes discussed and encouraged, as a part of hypertension management. The importance of attaining a healthy weight is also discussed.  BP/Weight 09/13/2014 08/11/2014 05/17/2014 01/04/2014 11/10/2013 99991111 [id number]  Systolic BP A999333 [id number] 0000000 [id number]  Diastolic BP 74 62 70 80 70 69 72  Wt. (Lbs) 199 198 193 185 186 191 196.08  BMI 37.62 37.43 36.49 34.97 35.16 36.11 37.07         CAD (coronary artery disease) Clinically stble, asymptomatic. Continue f/u with cardiology a sbefore   GERD (gastroesophageal reflux disease) Controlled, no change in medication    Allergic rhinitis Controlled, no change in medication    IGT (impaired glucose tolerance)  Unchanged. Patient educated about the importance of limiting  Carbohydrate intake , the need to commit to daily physical activity for a minimum of 30 minutes , and to commit weight loss. The fact that changes in all these areas will reduce or eliminate all together the development of diabetes is stressed.   Diabetic Labs Latest Ref Rng 09/07/2014 05/11/2014 12/31/2013 11/03/2013 06/30/2013  HbA1c <5.7 % 6.3(H) - - 6.3(H) 6.1(H)  Microalbumin 0.00 - 1.89 mg/dL - - - - -  Micro/Creat Ratio 0.0 - 30.0 mg/g - - - - -  Chol 0 - 200 mg/dL 149 - 149 - 124  HDL >=  46 mg/dL 45(L) - 55 - 39(L)  Calc LDL 0 - 99 mg/dL 79 - 70 - 62  Triglycerides <150 mg/dL 126 - 119 - 115  Creatinine 0.50 - 1.10 mg/dL 1.27(H) 1.16(H) 1.24(H) 1.24(H) 1.23(H)   BP/Weight 09/13/2014 08/11/2014 05/17/2014 01/04/2014 11/10/2013 99991111 [id number]  Systolic BP A999333 [id number] 0000000 [id number]  Diastolic BP 74 62 70 80 70 69 72  Wt. (Lbs) 199 198 193 185 186 191 196.08  BMI 37.62 37.43 36.49 34.97 35.16 36.11 37.07   Foot/eye exam completion dates Latest Ref Rng 10/05/2014  04/27/2010  Eye Exam No Retinopathy No Retinopathy -  Foot exam Order - - yes  Foot Form Completion - - -        Hypothyroidism Med adjustment made as uncontrolled, rept lab in 2 month   Morbid obesity Deteriorated. Patient re-educated about  the importance of commitment to a  minimum of 150 minutes of exercise per week.  The importance of healthy food choices with portion control discussed. Encouraged to start a food diary, count calories and to consider  joining a support group. Sample diet sheets offered. Goals set by the patient for the next several months.   Weight /BMI 09/13/2014 08/11/2014 05/17/2014  WEIGHT 199 lb 198 lb 193 lb  HEIGHT 5\' 1"  5\' 1"  5\' 1"   BMI 37.62 kg/m2 37.43 kg/m2 36.49 kg/m2    Current exercise per week 40 minutes.        Review of Systems     Objective:   Physical Exam        Assessment & Plan:

## 2014-09-13 NOTE — Patient Instructions (Addendum)
F/u in 5 month, call if you need me before.  New dose of synthroid is 75 mcg one daily , OK to take one and a half 50mcg tabs every day until you get the new dose  Commit to 50 ounces water every day,recent labs showed mild dehydration   Non fasting chem 7 and EGFR, TSH 3rd week in May at [facility] as discussed  Fastinglipid, cmp and EGFR, hBa1C , TSH and cBC in 5 month 

## 2014-09-20 ENCOUNTER — Other Ambulatory Visit: Payer: Self-pay | Admitting: Family Medicine

## 2014-09-20 DIAGNOSIS — Z1231 Encounter for screening mammogram for malignant neoplasm of breast: Secondary | ICD-10-CM

## 2014-10-05 DIAGNOSIS — H40013 Open angle with borderline findings, low risk, bilateral: Secondary | ICD-10-CM | POA: Diagnosis not present

## 2014-10-05 DIAGNOSIS — H2513 Age-related nuclear cataract, bilateral: Secondary | ICD-10-CM | POA: Diagnosis not present

## 2014-10-05 DIAGNOSIS — E119 Type 2 diabetes mellitus without complications: Secondary | ICD-10-CM | POA: Diagnosis not present

## 2014-10-05 LAB — HM DIABETES EYE EXAM

## 2014-10-18 ENCOUNTER — Other Ambulatory Visit: Payer: Self-pay | Admitting: *Deleted

## 2014-10-18 MED ORDER — METOPROLOL SUCCINATE ER 100 MG PO TB24: 100.0000 mg | ORAL_TABLET | Freq: Every day | ORAL | Status: DC

## 2014-10-20 ENCOUNTER — Other Ambulatory Visit: Payer: Self-pay | Admitting: Family Medicine

## 2014-10-20 ENCOUNTER — Telehealth: Payer: Self-pay | Admitting: Family Medicine

## 2014-10-20 NOTE — Telephone Encounter (Signed)
Med sent.

## 2014-10-31 NOTE — Assessment & Plan Note (Signed)
Med adjustment made as uncontrolled, rept lab in 2 month

## 2014-10-31 NOTE — Assessment & Plan Note (Signed)
Deteriorated. Patient re-educated about  the importance of commitment to a  minimum of 150 minutes of exercise per week.  The importance of healthy food choices with portion control discussed. Encouraged to start a food diary, count calories and to consider  joining a support group. Sample diet sheets offered. Goals set by the patient for the next several months.   Weight /BMI 09/13/2014 08/11/2014 05/17/2014  WEIGHT 199 lb 198 lb 193 lb  HEIGHT 5\' 1"  5\' 1"  5\' 1"   BMI 37.62 kg/m2 37.43 kg/m2 36.49 kg/m2    Current exercise per week 40 minutes.

## 2014-10-31 NOTE — Assessment & Plan Note (Signed)
Clinically stble, asymptomatic. Continue f/u with cardiology a sbefore

## 2014-10-31 NOTE — Assessment & Plan Note (Signed)
Controlled, no change in medication  

## 2014-10-31 NOTE — Assessment & Plan Note (Signed)
  Unchanged. Patient educated about the importance of limiting  Carbohydrate intake , the need to commit to daily physical activity for a minimum of 30 minutes , and to commit weight loss. The fact that changes in all these areas will reduce or eliminate all together the development of diabetes is stressed.   Diabetic Labs Latest Ref Rng 09/07/2014 05/11/2014 12/31/2013 11/03/2013 06/30/2013  HbA1c <5.7 % 6.3(H) - - 6.3(H) 6.1(H)  Microalbumin 0.00 - 1.89 mg/dL - - - - -  Micro/Creat Ratio 0.0 - 30.0 mg/g - - - - -  Chol 0 - 200 mg/dL 149 - 149 - 124  HDL >=46 mg/dL 45(L) - 55 - 39(L)  Calc LDL 0 - 99 mg/dL 79 - 70 - 62  Triglycerides <150 mg/dL 126 - 119 - 115  Creatinine 0.50 - 1.10 mg/dL 1.27(H) 1.16(H) 1.24(H) 1.24(H) 1.23(H)   BP/Weight 09/13/2014 08/11/2014 05/17/2014 01/04/2014 11/10/2013 [id number]  Systolic BP A999333 Q000111Q 0000000 [id number]  Diastolic BP 74 62 70 80 70 69 72  Wt. (Lbs) 199 198 193 185 186 191 196.08  BMI 37.62 37.43 36.49 34.97 35.16 36.11 37.07   Foot/eye exam completion dates Latest Ref Rng 10/05/2014 04/27/2010  Eye Exam No Retinopathy No Retinopathy -  Foot exam Order - - yes  Foot Form Completion - - -

## 2014-10-31 NOTE — Assessment & Plan Note (Signed)
Sub optimal control, no change in medication as pt has been intolerant of increase in med dose and is on multiple medications. DASH diet and commitment to daily physical activity for a minimum of 30 minutes discussed and encouraged, as a part of hypertension management. The importance of attaining a healthy weight is also discussed.  BP/Weight 09/13/2014 08/11/2014 05/17/2014 01/04/2014 11/10/2013 99991111 [id number]  Systolic BP A999333 Q000111Q 0000000 [id number]  Diastolic BP 74 62 70 80 70 69 72  Wt. (Lbs) 199 198 193 185 186 191 196.08  BMI 37.62 37.43 36.49 34.97 35.16 36.11 37.07

## 2014-11-25 DIAGNOSIS — E038 Other specified hypothyroidism: Secondary | ICD-10-CM | POA: Diagnosis not present

## 2014-12-01 ENCOUNTER — Telehealth: Payer: Self-pay | Admitting: Family Medicine

## 2014-12-01 NOTE — Telephone Encounter (Signed)
Patient aware.

## 2014-12-01 NOTE — Telephone Encounter (Signed)
Pls call pt and let her know that her thyroid blood test is now normal , so she needs to stay on the same dose of thyroid med, her potassium is normal, and her liver function is normal,ensure she drinks 64 ounces water daily , slightly dehydrated with abnormal kidney function

## 2014-12-06 ENCOUNTER — Ambulatory Visit (HOSPITAL_COMMUNITY)
Admission: RE | Admit: 2014-12-06 | Discharge: 2014-12-06 | Disposition: A | Discharge status: Home / Self Care | Payer: Medicare Other | Source: Ambulatory Visit | Attending: Family Medicine | Admitting: Family Medicine

## 2014-12-06 DIAGNOSIS — Z1231 Encounter for screening mammogram for malignant neoplasm of breast: Secondary | ICD-10-CM | POA: Insufficient documentation

## 2014-12-07 ENCOUNTER — Telehealth: Payer: Self-pay | Admitting: Family Medicine

## 2014-12-07 ENCOUNTER — Emergency Department (HOSPITAL_COMMUNITY)
Admission: EM | Admit: 2014-12-07 | Discharge: 2014-12-07 | Disposition: A | Discharge status: Home / Self Care | Payer: Medicare Other | Attending: Emergency Medicine | Admitting: Emergency Medicine

## 2014-12-07 ENCOUNTER — Encounter (HOSPITAL_COMMUNITY): Payer: Self-pay | Admitting: Emergency Medicine

## 2014-12-07 DIAGNOSIS — N182 Chronic kidney disease, stage 2 (mild): Secondary | ICD-10-CM | POA: Insufficient documentation

## 2014-12-07 DIAGNOSIS — Z7982 Long term (current) use of aspirin: Secondary | ICD-10-CM | POA: Diagnosis not present

## 2014-12-07 DIAGNOSIS — H409 Unspecified glaucoma: Secondary | ICD-10-CM | POA: Diagnosis not present

## 2014-12-07 DIAGNOSIS — R6884 Jaw pain: Secondary | ICD-10-CM | POA: Diagnosis present

## 2014-12-07 DIAGNOSIS — Z7951 Long term (current) use of inhaled steroids: Secondary | ICD-10-CM | POA: Diagnosis not present

## 2014-12-07 DIAGNOSIS — Z79899 Other long term (current) drug therapy: Secondary | ICD-10-CM | POA: Insufficient documentation

## 2014-12-07 DIAGNOSIS — Z87891 Personal history of nicotine dependence: Secondary | ICD-10-CM | POA: Diagnosis not present

## 2014-12-07 DIAGNOSIS — K088 Other specified disorders of teeth and supporting structures: Secondary | ICD-10-CM | POA: Insufficient documentation

## 2014-12-07 DIAGNOSIS — K0889 Other specified disorders of teeth and supporting structures: Secondary | ICD-10-CM

## 2014-12-07 DIAGNOSIS — Z951 Presence of aortocoronary bypass graft: Secondary | ICD-10-CM | POA: Insufficient documentation

## 2014-12-07 DIAGNOSIS — E669 Obesity, unspecified: Secondary | ICD-10-CM | POA: Insufficient documentation

## 2014-12-07 DIAGNOSIS — I129 Hypertensive chronic kidney disease with stage 1 through stage 4 chronic kidney disease, or unspecified chronic kidney disease: Secondary | ICD-10-CM | POA: Insufficient documentation

## 2014-12-07 DIAGNOSIS — I2510 Atherosclerotic heart disease of native coronary artery without angina pectoris: Secondary | ICD-10-CM | POA: Diagnosis not present

## 2014-12-07 LAB — CBC WITH DIFFERENTIAL/PLATELET
BASOS ABS: 0 10*3/uL (ref 0.0–0.1)
BASOS PCT: 0 % (ref 0–1)
Eosinophils Absolute: 0.4 10*3/uL (ref 0.0–0.7)
Eosinophils Relative: 6 % — ABNORMAL HIGH (ref 0–5)
HCT: 31.8 % — ABNORMAL LOW (ref 36.0–46.0)
HEMOGLOBIN: 10.5 g/dL — AB (ref 12.0–15.0)
LYMPHS PCT: 34 % (ref 12–46)
Lymphs Abs: 2.2 10*3/uL (ref 0.7–4.0)
MCH: 29.7 pg (ref 26.0–34.0)
MCHC: 33 g/dL (ref 30.0–36.0)
MCV: 90.1 fL (ref 78.0–100.0)
MONO ABS: 0.5 10*3/uL (ref 0.1–1.0)
MONOS PCT: 8 % (ref 3–12)
Neutro Abs: 3.3 10*3/uL (ref 1.7–7.7)
Neutrophils Relative %: 52 % (ref 43–77)
PLATELETS: 182 10*3/uL (ref 150–400)
RBC: 3.53 MIL/uL — ABNORMAL LOW (ref 3.87–5.11)
RDW: 12.6 % (ref 11.5–15.5)
WBC: 6.4 10*3/uL (ref 4.0–10.5)

## 2014-12-07 LAB — BASIC METABOLIC PANEL
ANION GAP: 9 (ref 5–15)
BUN: 29 mg/dL — ABNORMAL HIGH (ref 6–20)
CALCIUM: 9.5 mg/dL (ref 8.9–10.3)
CO2: 23 mmol/L (ref 22–32)
Chloride: 108 mmol/L (ref 101–111)
Creatinine, Ser: 1.35 mg/dL — ABNORMAL HIGH (ref 0.44–1.00)
GFR calc Af Amer: 44 mL/min — ABNORMAL LOW (ref >60)
GFR calc non Af Amer: 38 mL/min — ABNORMAL LOW (ref >60)
Glucose, Bld: 109 mg/dL — ABNORMAL HIGH (ref 65–99)
Potassium: 4.5 mmol/L (ref 3.5–5.1)
SODIUM: 140 mmol/L (ref 135–145)

## 2014-12-07 LAB — TROPONIN I: Troponin I: <0.03 ng/mL (ref <0.031)

## 2014-12-07 MED ORDER — LIDOCAINE-EPINEPHRINE (PF) 2 %-1:200000 IJ SOLN
10.0000 mL | Freq: Once | INTRAMUSCULAR | Status: AC
  Administered 2014-12-07: 10 mL
  Filled 2014-12-07: qty 20

## 2014-12-07 MED ORDER — HYDROCODONE-ACETAMINOPHEN 5-325 MG PO TABS
1.0000 | ORAL_TABLET | Freq: Once | ORAL | Status: AC
  Administered 2014-12-07: 1 via ORAL
  Filled 2014-12-07: qty 1

## 2014-12-07 MED ORDER — NITROGLYCERIN 0.4 MG SL SUBL
0.4000 mg | SUBLINGUAL_TABLET | SUBLINGUAL | Status: DC | PRN
  Administered 2014-12-07: 0.4 mg via SUBLINGUAL
  Filled 2014-12-07: qty 1

## 2014-12-07 MED ORDER — AMOXICILLIN 500 MG PO CAPS: 500.0000 mg | ORAL_CAPSULE | Freq: Three times a day (TID) | ORAL | Status: DC

## 2014-12-07 MED ORDER — TRAMADOL HCL 50 MG PO TABS: 50.0000 mg | ORAL_TABLET | Freq: Four times a day (QID) | ORAL | Status: DC | PRN

## 2014-12-07 NOTE — ED Notes (Signed)
C/o pain to right jaw. Pain when chewing and drinking liquids.  Dr Betsey Holiday at bedside.

## 2014-12-07 NOTE — Discharge Instructions (Signed)
Dental Pain °A tooth ache may be caused by cavities (tooth decay). Cavities expose the nerve of the tooth to air and hot or cold temperatures. It may come from an infection or abscess (also called a boil or furuncle) around your tooth. It is also often caused by dental caries (tooth decay). This causes the pain you are having. °DIAGNOSIS  °Your caregiver can diagnose this problem by exam. °TREATMENT  °· If caused by an infection, it may be treated with medications which kill germs (antibiotics) and pain medications as prescribed by your caregiver. Take medications as directed. °· Only take over-the-counter or prescription medicines for pain, discomfort, or fever as directed by your caregiver. °· Whether the tooth ache today is caused by infection or dental disease, you should see your dentist as soon as possible for further care. °SEEK MEDICAL CARE IF: °The exam and treatment you received today has been provided on an emergency basis only. This is not a substitute for complete medical or dental care. If your problem worsens or new problems (symptoms) appear, and you are unable to meet with your dentist, call or return to this location. °SEEK IMMEDIATE MEDICAL CARE IF:  °· You have a fever. °· You develop redness and swelling of your face, jaw, or neck. °· You are unable to open your mouth. °· You have severe pain uncontrolled by pain medicine. °MAKE SURE YOU:  °· Understand these instructions. °· Will watch your condition. °· Will get help right away if you are not doing well or get worse. °Document Released: 06/18/2005 Document Revised: 09/10/2011 Document Reviewed: 02/04/2008 °ExitCare® Patient Information ©2015 [employer]. This information is not intended to replace advice given to you by your health care provider. Make sure you discuss any questions you have with your health care provider. ° °Emergency Department Resource Guide °1) Find a Doctor and Pay Out of Pocket °Although you won't have to find out who  is covered by your insurance plan, it is a good idea to ask around and get recommendations. You will then need to call the office and see if the doctor you have chosen will accept you as a new patient and what types of options they offer for patients who are self-pay. Some doctors offer discounts or will set up payment plans for their patients who do not have insurance, but you will need to ask so you aren't surprised when you get to your appointment. ° °2) Contact Your Local Health Department °Not all health departments have doctors that can see patients for sick visits, but many do, so it is worth a call to see if yours does. If you don't know where your local health department is, you can check in your phone book. The CDC also has a tool to help you locate your state's health department, and many state websites also have listings of all of their local health departments. ° °3) Find a Walk-in Clinic °If your illness is not likely to be very severe or complicated, you may want to try a walk in clinic. These are popping up all over the country in pharmacies, drugstores, and shopping centers. They're usually staffed by nurse practitioners or physician assistants that have been trained to treat common illnesses and complaints. They're usually fairly quick and inexpensive. However, if you have serious medical issues or chronic medical problems, these are probably not your best option. ° °No Primary Care Doctor: °- Call [facility] at  832-8000 - they can help you locate a primary   care doctor that  accepts your insurance, provides certain services, etc. °- Physician Referral Service- 1-800-533-3463 ° °Chronic Pain Problems: °Organization         Address  Phone   Notes  °[facility]  (336) 297-2271 Patients need to be referred by their primary care doctor.  ° °Medication Assistance: °Organization         Address  Phone   Notes  °[place] [facility] [address]  [address] °[place], [place] [address] (336) 641-8030 --Must be a resident of [place] °-- Must have NO insurance coverage whatsoever (no Medicaid/ Medicare, etc.) °-- The pt. MUST have a primary care doctor that directs their care regularly and follows them in the community °  °[facility]  (866) 331-1348   °[employer]  (888) 892-1162   ° °Agencies that provide inexpensive medical care: °Organization         Address  Phone   Notes  °[facility]  (336) 832-8035   °[facility]    (336) 832-7272   °[facility] [address] °[place], [place] [address] (336) 832-4777   °[facility] [address], °[place] (336) 271-4999   °[facility]    (336) 373-0678   °[facility]    (336) 272-1050   °[facility] ° [address], [place] Phone:  (336) 832-4444, Fax:  (336) 832-4440 Hours of Operation:  9 am - 6 pm, M-F.  Also accepts Medicaid/Medicare and self-pay.  °[facility] ° [address], [place] Phone: (336) 832-3150, Fax: (336) 832-3151. Hours of Operation:  8:30 am - 5:30 pm, M-F.  Also accepts Medicaid and self-pay.  °[facility] [place] [address], [place] Phone: (336) 878-6027   °[facility] [address], [place], [place] (336)723-1848, Ext. 123 Mondays & Thursdays: 7-9 AM.  First 15 patients are seen on a first come, first serve basis. °  ° °Medicaid-accepting [place] Providers: ° °Organization         Address  Phone   Notes  °[facility] [address], [place] (336) 641-2100 Also accepts self-pay patients.  °[facility] [address], [place] ° (336) 856-9996   °[facility] [address] [place] [address], [place] (336) 288-8857   °[facility] [address] [place] [address], [place] (336) 299-7000   °Veita Bland [address], [place]  ° (336) 373-1557 Only accepts [place] Access Medicaid patients after they have their name applied to their card.  ° °Self-Pay (no insurance) in [place]: ° °Organization         Address  Phone   Notes  °Sickle Cell Patients, [facility] [address], [place] (336) 832-1970   °[facility] [address], [place] (336) 832-4400   °[facility] ° [address], [place] (336) 992-4800   °[facility]/Dr. Osei-Bonsu ° [address] [place] [address], [place] or [address], [place] (336) 841-8500 Phone number for both [place] and [place] locations is the same.  °[facility] [address], [place] (336) 299-0000   °[facility] [address] [place] [address],  or [address] (336) 852-7530 °(336) 878-2260   °  [facility] [address], [place] (336) 350-1642, phone; (336) 294-5005, fax Sees patients 1st and 3rd Saturday of every month.  Must not qualify for public or private insurance (i.e. Medicaid, Medicare, [place] Health Choice, Veterans' Benefits) • Household income should be no more than 200% of the poverty level •The clinic cannot treat you if you are pregnant or think you are pregnant • Sexually transmitted diseases are not treated at the clinic.  ° ° °Dental Care: °Organization         Address  Phone  Notes  °[facility] [facility] [address], [place] (336) 641-6152 Accepts children up to age 21 who are enrolled in Medicaid or [place] Health Choice; pregnant women with a Medicaid card; and children who have applied for Medicaid or [place] Health Choice, but were declined, whose parents can pay a reduced fee at time of service.  °[facility] [place]  [address], [place] (336) 641-7733 Accepts children up to age 21 who are  enrolled in Medicaid or [place] Health Choice; pregnant women with a Medicaid card; and children who have applied for Medicaid or [place] Health Choice, but were declined, whose parents can pay a reduced fee at time of service.  °[facility] ° [address], [place] (336) 641-4533 Patients are seen by appointment only. Walk-ins are not accepted. [facility] will see patients 18 years of age and older. °Monday - Tuesday (8am-5pm) °Most Wednesdays (8:30-5pm) °$30 per visit, cash only  °[facility] ° [address], [place] (336) 641-4533 Patients are seen by appointment only. Walk-ins are not accepted. [facility] will see patients 18 years of age and older. °One Wednesday Evening (Monthly: Volunteer Based).  $30 per visit, cash only  °[facility]  (919) 537-3737 for adults; Children under age 4, call Graduate Pediatric Dentistry at (919) [phone]. Children aged 4-14, please call (919) [phone] to request a pediatric application. ° Dental services are provided in all areas of dental care including fillings, crowns and bridges, complete and partial dentures, implants, gum treatment, root canals, and extractions. Preventive care is also provided. Treatment is provided to both adults and children. °Patients are selected via a lottery and there is often a waiting list. °  °[facility] [address], ° ° (336) 763-8833 [email or web address].[email or web address] °  °[facility] [address], [place], [place] (336)723-1848, Ext. 123 Second and Fourth Thursday of each month, opens at 6:30 AM; Clinic ends at 9 AM.  Patients are seen on a first-come first-served basis, and a limited number are seen during each clinic.  ° °[facility] ° [address], [place], [place] (336) 723-7904   Eligibility Requirements °You must have lived in [place], [place], or [place] counties for at least the last three months. °  You  cannot be eligible for state or federal sponsored healthcare insurance, including Veterans Administration, Medicaid, or Medicare. °  You generally cannot be eligible for healthcare insurance through your employer.  °  How to apply: °Eligibility screenings are held every Tuesday and Wednesday afternoon from 1:00 pm until 4:00 pm. You do not need an appointment for the interview!  °[facility] [address], [place], [place] 336-631-2330   °[facility]  336-342-8273   °[facility]  336-703-3100   °[facility]  336-570-6415   ° °Behavioral Health Resources in the Community: °Intensive Outpatient Programs °Organization         Address  Phone  Notes  °[facility] [address], [place], [place] 336-878-6098   °[facility] [address], [place], [place] 336-832-9800   °[facility] [address], [place], [place] ° 336-882-2125   °[facility] [address],  °[place], [place] 1-800-853-5163 or 336-641-4981   °Substance Abuse Resources °Organization         Address  Phone  Notes  °[facility]  336-882-2125   °[facility]  336-784-9470   °[facility]  336-285-9073   °[facility]  336-845-3988   °Residential & Outpatient Substance Abuse Program  1-800-659-3381   °Psychological Services °Organization         Address  Phone  Notes  °[facility]  336- 832-9600   °[facility]  336- 378-7881   °[facility] [address], [place] 1-800-853-5163 or 336-641-4981   ° °Mobile Crisis Teams °Organization         Address  Phone  Notes  °[facility], Mobile Crisis Care Unit  1-877-626-1772   °Assertive °[facility] ° [address] [place], [place] 336-834-9664   °Sharon DeEsch [address] °[place] 336-554-5454   ° °Self-Help/Support  Groups °Organization         Address  Phone             Notes  °[facility] [place] - variety of support groups  336- 373-1402 Call for more information  °Narcotics Anonymous (NA), [facility] [address], °[place]  2 meetings at this location  ° °Residential Treatment Programs °Organization         Address  Phone  Notes  °[facility] [address],    °[place]  1-866-801-8205   °[facility] ° [address], [place], [place] 704-293-8524   °[facility] [address], [place] 336-845-3988 Admissions: 8am-3pm M-F  °[facility] [address]    °[place], [place] 336-841-1104   °[facility] [address], [place], [place] 336-379-7146   °[facility] [address]  °[place], [place] 336-285-9073   °[facility] Intensive Outpatient [address], [place], [place] 336-852-3033   °[facility] [address]  °[place], [place] 1-877-615-2722 or 336-784-9470   °[facility] [address] [place], [place] 336-227-7417 Accepts Medicaid  °[facility] [address]  °[place]  1-800-659-3381 Substance Abuse/Addiction Treatment  ° °[place] Behavioral Health Resources °Organization         Address  Phone  Notes  °[facility]  (888) 581-9988   °Julie Brannon, PhD [address] [place],    (336) 349-5553 or (336) 951-0000   °[facility]   [address] °[place],  (336) 349-4454   °[facility] [address], [place],  (336) 342-8316 Insurance/Medicaid/sponsorship through [facility]  °[facility] [address]                                    [place],  (336) 342-8316 Therapy/tele-psych/case  °[facility]   [address]  ° [place], [place] (336) 349-2233    °Dr. Arfeen  (336) 349-4544   °[facility] [place]  [facility]  [employer] [facility] [place] [facility] 1) [address], [place] °2) [address], [place] °3)  [address], [place] (336) 349-3220 °(336) 342-7768 ° °(336) 342-8140   °[place] [facility] (336) 342-1394 or (336) 342-3537 (After Hours)    ° ° ° °

## 2014-12-07 NOTE — ED Provider Notes (Addendum)
CSN: [id number]     Arrival date & time 12/07/14  [id number] History   First MD Initiated Contact with Patient 12/07/14 [phone]     Chief Complaint  Patient presents with  . Jaw Pain     (Consider location/radiation/quality/duration/timing/severity/associated sxs/prior Treatment) HPI Comments: Patient presents to the ER for evaluation of right-sided jaw pain. Patient reports the symptoms began yesterday around lunchtime when she started eating. She noticed pain on the right side of her mouth when she chews. Patient reports the pain is been constant since it started. She has not had any shortness of breath, nausea, diaphoresis associated with the symptoms. There is no facial swelling. Patient reports that she did have left-sided jaw pain when she had heart problems prior to her bypass surgery.   Past Medical History  Diagnosis Date  . Obesity   . 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 Hyperlipidemia   . Hypertension   . Glaucoma dx 2010    had laser to both eyes in 2010  . Hypothyroidism   . Renal insufficiency, mild   . Carotid artery disease 11/2008    doppler 11/2008.Marland KitchenMarland Kitchen50% RICA / doppler 03/2010.Marland KitchenMarland KitchenMarland Kitchen60-79% RICA ...less than [id number] LICA.Marland KitchenMarland KitchenPlan   . Hx of CABG     2005  . Ejection fraction     EF 70%, nuclear, July, 2009    Past Surgical History  Procedure Laterality Date  . Coronary artery bypass graft x4 (10/05)    . Coronary artery bypass graft  2004    [facility]  . Colonoscopy N/A 02/04/2013    Procedure: COLONOSCOPY;  Surgeon: Rogene Houston, MD;  Location: [facility];  Service: Endoscopy;  Laterality: N/A;  1015   Family History  Problem Relation Age of Onset  . Aneurysm Mother   . Hypertension Mother   . Coronary artery disease Mother   . Coronary artery disease Father   . Diabetes Sister   . Hypertension Sister   . Coronary artery disease Sister   . Diabetes Son   . Colon cancer Brother 78    lived 18  months afterwards  . Cancer Brother 69    liver   . Diabetes Sister   . Diabetes Sister   . Cancer Sister 86    breast  . Hearing loss Sister    History  Substance Use Topics  . Smoking status: Former Smoker -- 0.30 packs/day for 40 years    Types: Cigarettes    Quit date: 07/03/2003  . Smokeless tobacco: Never Used  . Alcohol Use: No   OB History    No data available     Review of Systems  Constitutional: Negative for diaphoresis.  HENT:       Jaw pain  Respiratory: Negative for shortness of breath.   Cardiovascular: Negative for chest pain.  Gastrointestinal: Negative for nausea.  All other systems reviewed and are negative.     Allergies  Clonidine derivatives  Home Medications   Prior to Admission medications   Medication Sig Start Date End Date Taking? Authorizing Provider  acetaminophen (TYLENOL) 500 MG tablet Take 1,000 mg by mouth every 6 (six) hours as needed for pain.    Historical Provider, MD  albuterol (PROAIR HFA) 108 (90 BASE) MCG/ACT inhaler INHALE ONE PUFF EVERY FOUR HOURS AS NEEDED. 11/30/13   Fayrene Helper, MD  amLODipine (NORVASC) 5 MG tablet TAKE ONE TABLET BY MOUTH DAILY. 10/20/14  Fayrene Helper, MD  aspirin 81 MG tablet Take 81 mg by mouth daily.    Historical Provider, MD  cetirizine (QC ALL DAY ALLERGY) 10 MG tablet Take 1 tablet (10 mg total) by mouth daily. 09/06/14   Fayrene Helper, MD  Cholecalciferol (VITAMIN D3) 1000 UNITS CAPS Take 1 capsule by mouth daily.    Historical Provider, MD  fish oil-omega-3 fatty acids 1000 MG capsule Take 2 g by mouth daily.    Historical Provider, MD  fluticasone (FLONASE) 50 MCG/ACT nasal spray Place 2 sprays into both nostrils daily. 11/30/13   Fayrene Helper, MD  ipratropium-albuterol (DUONEB) 0.5-2.5 (3) MG/3ML SOLN Take 3 mLs by nebulization every 8 (eight) hours as needed. 09/13/11   Fayrene Helper, MD  latanoprost (XALATAN) 0.005 % ophthalmic solution Place 1 drop into both eyes at  bedtime. One drop each eye at bedtime     Historical Provider, MD  levothyroxine (SYNTHROID, LEVOTHROID) 75 MCG tablet Take 1 tablet (75 mcg total) by mouth daily. 09/13/14   Fayrene Helper, MD  metoprolol succinate (TOPROL-XL) 100 MG 24 hr tablet Take 1 tablet (100 mg total) by mouth daily. Take with or immediately following a meal. 10/18/14   Carlena Bjornstad, MD  Multiple Vitamins-Minerals [facility] COMPLETE 50 PLUS PO) Take by mouth. Take one tablet by mouth daily      Historical Provider, MD  omeprazole (PRILOSEC) 40 MG capsule Take 1 capsule (40 mg total) by mouth daily. 09/13/14   Fayrene Helper, MD  rosuvastatin (CRESTOR) 10 MG tablet Take 1 tablet (10 mg total) by mouth daily. 04/02/14   Fayrene Helper, MD  spironolactone (ALDACTONE) 25 MG tablet Take 1 tablet (25 mg total) by mouth daily. 09/13/14   Fayrene Helper, MD  valsartan-hydrochlorothiazide (DIOVAN-HCT) 320-25 MG per tablet TAKE ONE TABLET BY MOUTH DAILY 09/13/14   Fayrene Helper, MD   BP 202/71 mmHg  Pulse 67  Temp(Src) 97.9 F (36.6 C) (Oral)  Ht 5\' 1"  (1.549 m)  Wt 198 lb (89.812 kg)  BMI 37.43 kg/m2  SpO2 97% Physical Exam  Constitutional: She is oriented to person, place, and time. She appears well-developed and well-nourished. No distress.  HENT:  Head: Normocephalic and atraumatic.  Right Ear: Hearing normal.  Left Ear: Hearing normal.  Nose: Nose normal.  Mouth/Throat: Oropharynx is clear and moist and mucous membranes are normal.    Eyes: Conjunctivae and EOM are normal. Pupils are equal, round, and reactive to light.  Neck: Normal range of motion. Neck supple.  Cardiovascular: Regular rhythm, S1 normal and S2 normal.  Exam reveals no gallop and no friction rub.   No murmur heard. Pulmonary/Chest: Effort normal and breath sounds normal. No respiratory distress. She exhibits no tenderness.  Abdominal: Soft. Normal appearance and bowel sounds are normal. There is no hepatosplenomegaly. There is  no tenderness. There is no rebound, no guarding, no tenderness at McBurney's point and negative Murphy's sign. No hernia.  Musculoskeletal: Normal range of motion.  Neurological: She is alert and oriented to person, place, and time. She has normal strength. No cranial nerve deficit or sensory deficit. Coordination normal. GCS eye subscore is 4. GCS verbal subscore is 5. GCS motor subscore is 6.  Skin: Skin is warm, dry and intact. No rash noted. No cyanosis.  Psychiatric: She has a normal mood and affect. Her speech is normal and behavior is normal. Thought content normal.  Nursing note and vitals reviewed.   ED Course  Procedures (including critical care time)  Procedure: Periapical dental block A #30-gauge needle was infiltrated into the gingiva at the apex of the tender tooth and 1 mL of 2% epinephrine with lidocaine was injected. Patient tolerated procedure well. There were no complications.  Labs Review Labs Reviewed  CBC WITH DIFFERENTIAL/PLATELET  BASIC METABOLIC PANEL  TROPONIN I    Imaging Review Mm Screening Breast Tomo Bilateral  12/06/2014   CLINICAL DATA:  Screening.  EXAM: DIGITAL SCREENING BILATERAL MAMMOGRAM WITH 3D TOMO WITH CAD  COMPARISON:  Previous exam(s).  ACR Breast Density Category b: There are scattered areas of fibroglandular density.  FINDINGS: There are no findings suspicious for malignancy. Images were processed with CAD.  IMPRESSION: No mammographic evidence of malignancy. A result letter of this screening mammogram will be mailed directly to the patient.  RECOMMENDATION: Screening mammogram in one year. (Code:SM-B-01Y)  BI-RADS CATEGORY  1: Negative.   Electronically Signed   By: Nolon Nations M.D.   On: 12/06/2014 16:40     EKG Interpretation None      MDM   Final diagnoses:  None   jaw pain  Toothache  Patient presents to the ER for evaluation of jaw pain. Patient reports that pain started yesterday. She has noticed there is some tenderness on  the right side of her mouth, both upper and lower. There is no facial swelling. Examination did reveal some tenderness of the right side molar without any gingival swelling or abscess formation.  Patient reports similar symptoms with her cardiac disease prior to bypass in 2005. Reviewing the patient's records reveals that she did have a nuclear medicine stress test in 2009 that did not show ischemia. Since then she has not had any cardiac testing. EKG today shows inverted T waves diffusely, this is new since the most previous EKG. Patient was given nitroglycerin without any improvement.  Case was discussed with Dr. Purcell Nails, on call for cardiology. She did have access to an EKG from February that looked similar to today's EKG. T wave changes are not new. Based on the current presentation of continuous pain since yesterday, tenderness to percussion of her tooth, no other cardiac etiology. Also she has no chest pain, no exacerbation with exertion, no improvement with nitroglycerin. Recommendation was to perform second troponin and then treat her as dental pain if negative. No admission necessary.  At this point I performed periapical dental block and patient had resolution of her pain within 2 minutes of injection. This confirms dental pain. I therefore did not feel that the patient required a second troponin. Patient will be discharged, treated with amoxicillin and Ultram. Follow-up with dentist as soon as possible.   Orpah Greek, MD 12/07/14 [id number]  Orpah Greek, MD 12/07/14 [id number]  Orpah Greek, MD 12/07/14 [id number]

## 2014-12-07 NOTE — Telephone Encounter (Signed)
We do not refer for dentists , pt ois responsible , I agree

## 2014-12-07 NOTE — Telephone Encounter (Signed)
Patient called stating that she had been to [facility] and she thought she was having a heart attack she had the same symptoms as before and the Dr told her she has dental problems and that she needed to go to the Dentist and she has been trying a Pharmacist, community that will see her wanted to know did Dr Moshe Cipro know of any I stated to patient that she needs to call her insurance company and ask them we don't do dentist appointments and she had and the Drs they told her was not accepting new patients or her insurance card stated to the patient that she needs to call back and tell the insurance company in the [place] or [place] area she stated she was

## 2014-12-07 NOTE — ED Notes (Signed)
MD at bedside. 

## 2014-12-21 ENCOUNTER — Encounter: Payer: Self-pay | Admitting: Family Medicine

## 2014-12-31 ENCOUNTER — Other Ambulatory Visit: Payer: Self-pay | Admitting: Family Medicine

## 2015-01-02 ENCOUNTER — Telehealth: Payer: Self-pay | Admitting: Family Medicine

## 2015-01-03 ENCOUNTER — Other Ambulatory Visit: Payer: Self-pay | Admitting: Family Medicine

## 2015-01-03 MED ORDER — ROSUVASTATIN CALCIUM 10 MG PO TABS: 10.0000 mg | ORAL_TABLET | Freq: Every day | ORAL | Status: DC

## 2015-01-03 NOTE — Telephone Encounter (Signed)
Refill for 5 monhts sent on 7/4  Noted a refill was sent on 7/1 also let her know  pls

## 2015-01-17 DIAGNOSIS — E038 Other specified hypothyroidism: Secondary | ICD-10-CM | POA: Diagnosis not present

## 2015-01-17 DIAGNOSIS — E785 Hyperlipidemia, unspecified: Secondary | ICD-10-CM | POA: Diagnosis not present

## 2015-01-17 DIAGNOSIS — I10 Essential (primary) hypertension: Secondary | ICD-10-CM | POA: Diagnosis not present

## 2015-01-17 DIAGNOSIS — R7302 Impaired glucose tolerance (oral): Secondary | ICD-10-CM | POA: Diagnosis not present

## 2015-01-17 LAB — HEMOGLOBIN A1C: Hgb A1c MFr Bld: 6 % (ref 4.0–6.0)

## 2015-01-17 LAB — LIPID PANEL
CHOLESTEROL: 145 mg/dL (ref 0–200)
HDL: 47 mg/dL (ref 35–70)
LDL Cholesterol: 73 mg/dL
TRIGLYCERIDES: 125 mg/dL (ref 40–160)

## 2015-01-17 LAB — TSH: TSH: 1.95 u[IU]/mL (ref <=5.90)

## 2015-01-24 ENCOUNTER — Encounter: Payer: Self-pay | Admitting: Family Medicine

## 2015-01-24 ENCOUNTER — Ambulatory Visit (INDEPENDENT_AMBULATORY_CARE_PROVIDER_SITE_OTHER): Payer: Medicare Other | Admitting: Family Medicine

## 2015-01-24 VITALS — BP 150/70 | HR 84 | Resp 18 | Ht 61.0 in | Wt 195.0 lb

## 2015-01-24 DIAGNOSIS — K219 Gastro-esophageal reflux disease without esophagitis: Secondary | ICD-10-CM

## 2015-01-24 DIAGNOSIS — R7302 Impaired glucose tolerance (oral): Secondary | ICD-10-CM

## 2015-01-24 DIAGNOSIS — E038 Other specified hypothyroidism: Secondary | ICD-10-CM

## 2015-01-24 DIAGNOSIS — I10 Essential (primary) hypertension: Secondary | ICD-10-CM

## 2015-01-24 DIAGNOSIS — I2510 Atherosclerotic heart disease of native coronary artery without angina pectoris: Secondary | ICD-10-CM

## 2015-01-24 DIAGNOSIS — E785 Hyperlipidemia, unspecified: Secondary | ICD-10-CM

## 2015-01-24 DIAGNOSIS — N189 Chronic kidney disease, unspecified: Secondary | ICD-10-CM

## 2015-01-24 MED ORDER — AMLODIPINE BESYLATE 5 MG PO TABS: 5.0000 mg | ORAL_TABLET | Freq: Every day | ORAL | Status: DC

## 2015-01-24 MED ORDER — LEVOTHYROXINE SODIUM 75 MCG PO TABS: 75.0000 ug | ORAL_TABLET | Freq: Every day | ORAL | Status: DC

## 2015-01-24 MED ORDER — OMEPRAZOLE 40 MG PO CPDR: 40.0000 mg | DELAYED_RELEASE_CAPSULE | Freq: Every day | ORAL | Status: DC

## 2015-01-24 MED ORDER — METOPROLOL SUCCINATE ER 100 MG PO TB24: 100.0000 mg | ORAL_TABLET | Freq: Every day | ORAL | Status: DC

## 2015-01-24 MED ORDER — SPIRONOLACTONE 25 MG PO TABS: 25.0000 mg | ORAL_TABLET | Freq: Every day | ORAL | Status: DC

## 2015-01-24 NOTE — Patient Instructions (Addendum)
Annual wellness early December , call if you need me before  Excellent labs, cut back on fat in diet  Non fasting cmp and EGFr and TSH end Novemeber  Please work on good  health habits so that your health will improve. 1. Commitment to daily physical activity for 30 to 60  minutes, if you are able to do this.  2. Commitment to wise food choices. Aim for half of your  food intake to be vegetable and fruit, one quarter starchy foods, and one quarter protein. Try to eat on a regular schedule  3 meals per day, snacking between meals should be limited to vegetables or fruits or small portions of nuts. 64 ounces of water per day is generally recommended, unless you have specific health conditions, like heart failure or kidney failure where you will need to limit fluid intake.  3. Commitment to sufficient and a  good quality of physical and mental rest daily, generally between 6 to 8 hours per day.  WITH PERSISTANCE AND PERSEVERANCE, THE IMPOSSIBLE , BECOMES THE NORM!   Thanks for choosing [facility], we consider it a privelige to serve you.

## 2015-02-03 ENCOUNTER — Encounter: Payer: Self-pay | Admitting: Family Medicine

## 2015-02-14 NOTE — Assessment & Plan Note (Signed)
Hyperlipidemia:Low fat diet discussed and encouraged.   Lipid Panel  Lab Results  Component Value Date   CHOL 145 01/17/2015   HDL 47 01/17/2015   LDLCALC 73 01/17/2015   TRIG 125 01/17/2015   CHOLHDL 3.3 09/07/2014   Controlled, no change in medication

## 2015-02-14 NOTE — Assessment & Plan Note (Signed)
Sub optimal control, but I do believe this is the best that can be achieved for this patient, no med changes, also seen by cardiology , no chnages hae been made  DASH diet and commitment to daily physical activity for a minimum of 30 minutes discussed and encouraged, as a part of hypertension management. The importance of attaining a healthy weight is also discussed.  BP/Weight 01/24/2015 12/07/2014 09/13/2014 08/11/2014 05/17/2014 01/04/2014 99991111  Systolic BP Q000111Q 123XX123 [id number] Q000111Q 0000000 [id number]  Diastolic BP 70 68 74 62 70 80 70  Wt. (Lbs) 195.04 198 199 198 193 185 186  BMI 36.87 37.43 37.62 37.43 36.49 34.97 35.16

## 2015-02-14 NOTE — Assessment & Plan Note (Signed)
Patient educated about the importance of limiting  Carbohydrate intake , the need to commit to daily physical activity for a minimum of 30 minutes , and to commit weight loss. The fact that changes in all these areas will reduce or eliminate all together the development of diabetes is stressed.  Improved Diabetic Labs Latest Ref Rng 01/17/2015 12/07/2014 09/07/2014 05/11/2014 12/31/2013  HbA1c 4.0 - 6.0 % 6.0 - 6.3(H) - -  Microalbumin 0.00 - 1.89 mg/dL - - - - -  Micro/Creat Ratio 0.0 - 30.0 mg/g - - - - -  Chol 0 - 200 mg/dL 145 - 149 - 149  HDL 35 - 70 mg/dL 47 - 45(L) - 55  Calc LDL - 73 - 79 - 70  Triglycerides 40 - 160 mg/dL 125 - 126 - 119  Creatinine 0.44 - 1.00 mg/dL - 1.35(H) 1.27(H) 1.16(H) 1.24(H)   BP/Weight 01/24/2015 12/07/2014 09/13/2014 08/11/2014 05/17/2014 01/04/2014 99991111  Systolic BP Q000111Q 123XX123 A999333 Q000111Q 0000000 [id number]  Diastolic BP 70 68 74 62 70 80 70  Wt. (Lbs) 195.04 198 199 198 193 185 186  BMI 36.87 37.43 37.62 37.43 36.49 34.97 35.16   Foot/eye exam completion dates Latest Ref Rng 10/05/2014 04/27/2010  Eye Exam No Retinopathy No Retinopathy -  Foot exam Order - - yes  Foot Form Completion - - -

## 2015-02-14 NOTE — Progress Notes (Signed)
[name] [name]     MRN: [id number]      DOB: 03-24-1941   HPI [name] is here for follow up and re-evaluation of chronic medical conditions, medication management and review of any available recent lab and radiology data.  Preventive health is updated, specifically  Cancer screening and Immunization.   Questions or concerns regarding consultations or procedures which the PT has had in the interim are  Addressed.Has seen cardiology and is doing well The PT denies any adverse reactions to current medications since the last visit.  There are no new concerns.  There are no specific complaints   ROS Denies recent fever or chills. Denies sinus pressure, nasal congestion, ear pain or sore throat. Denies chest congestion, productive cough or wheezing. Denies chest pains, palpitations and leg swelling Denies abdominal pain, nausea, vomiting,diarrhea or constipation.   Denies dysuria, frequency, hesitancy or incontinence. Denies joint pain, swelling and limitation in mobility. Denies headaches, seizures, numbness, or tingling. Denies depression, anxiety or insomnia. Denies skin break down or rash.   PE  BP 150/70 mmHg  Pulse 84  Resp 18  Ht 5\' 1"  (1.549 m)  Wt 195 lb 0.6 oz (88.47 kg)  BMI 36.87 kg/m2  SpO2 98%  Patient alert and oriented and in no cardiopulmonary distress.  HEENT: No facial asymmetry, EOMI,   oropharynx pink and moist.  Neck supple no JVD, no mass.  Chest: Clear to auscultation bilaterally.  CVS: S1, S2 no murmurs, no S3.Regular rate.  ABD: Soft non tender.   Ext: No edema  MS: Adequate though reduced  ROM spine, shoulders, hips and knees.  Skin: Intact, no ulcerations or rash noted.  Psych: Good eye contact, normal affect. Memory intact not anxious or depressed appearing.  CNS: CN 2-12 intact, power,  normal throughout.no focal deficits noted.   Assessment & Plan   Essential hypertension Sub optimal control, but I do believe this is the best  that can be achieved for this patient, no med changes, also seen by cardiology , no chnages hae been made  DASH diet and commitment to daily physical activity for a minimum of 30 minutes discussed and encouraged, as a part of hypertension management. The importance of attaining a healthy weight is also discussed.  BP/Weight 01/24/2015 12/07/2014 09/13/2014 08/11/2014 05/17/2014 01/04/2014 99991111  Systolic BP Q000111Q 123XX123 A999333 Q000111Q 0000000 [id number]  Diastolic BP 70 68 74 62 70 80 70  Wt. (Lbs) 195.04 198 199 198 193 185 186  BMI 36.87 37.43 37.62 37.43 36.49 34.97 35.16        CAD (coronary artery disease) Currently stable, asymptomatic   GERD (gastroesophageal reflux disease) Controlled, no change in medication   Morbid obesity Improved Patient re-educated about  the importance of commitment to a  minimum of 150 minutes of exercise per week.  The importance of healthy food choices with portion control discussed. Encouraged to start a food diary, count calories and to consider  joining a support group. Sample diet sheets offered. Goals set by the patient for the next several months.   Weight /BMI 01/24/2015 12/07/2014 09/13/2014  WEIGHT 195 lb 0.6 oz 198 lb 199 lb  HEIGHT 5\' 1"  5\' 1"  5\' 1"   BMI 36.87 kg/m2 37.43 kg/m2 37.62 kg/m2    Current exercise per week 90 minutes.   CKD (chronic kidney disease) Importance of avoidance of NSAIDS is discussed, clinically stable disease present, wpould improve with better BP control  Hyperlipidemia LDL goal <70 Hyperlipidemia:Low fat diet discussed  and encouraged.   Lipid Panel  Lab Results  Component Value Date   CHOL 145 01/17/2015   HDL 47 01/17/2015   LDLCALC 73 01/17/2015   TRIG 125 01/17/2015   CHOLHDL 3.3 09/07/2014   Controlled, no change in medication      Hypothyroidism Controlled, no change in medication   IGT (impaired glucose tolerance) Patient educated about the importance of limiting  Carbohydrate intake , the need to  commit to daily physical activity for a minimum of 30 minutes , and to commit weight loss. The fact that changes in all these areas will reduce or eliminate all together the development of diabetes is stressed.  Improved Diabetic Labs Latest Ref Rng 01/17/2015 12/07/2014 09/07/2014 05/11/2014 12/31/2013  HbA1c 4.0 - 6.0 % 6.0 - 6.3(H) - -  Microalbumin 0.00 - 1.89 mg/dL - - - - -  Micro/Creat Ratio 0.0 - 30.0 mg/g - - - - -  Chol 0 - 200 mg/dL 145 - 149 - 149  HDL 35 - 70 mg/dL 47 - 45(L) - 55  Calc LDL - 73 - 79 - 70  Triglycerides 40 - 160 mg/dL 125 - 126 - 119  Creatinine 0.44 - 1.00 mg/dL - 1.35(H) 1.27(H) 1.16(H) 1.24(H)   BP/Weight 01/24/2015 12/07/2014 09/13/2014 08/11/2014 05/17/2014 01/04/2014 99991111  Systolic BP Q000111Q 123XX123 A999333 Q000111Q 0000000 [id number]  Diastolic BP 70 68 74 62 70 80 70  Wt. (Lbs) 195.04 198 199 198 193 185 186  BMI 36.87 37.43 37.62 37.43 36.49 34.97 35.16   Foot/eye exam completion dates Latest Ref Rng 10/05/2014 04/27/2010  Eye Exam No Retinopathy No Retinopathy -  Foot exam Order - - yes  Foot Form Completion - - -

## 2015-02-14 NOTE — Assessment & Plan Note (Signed)
Controlled, no change in medication  

## 2015-02-14 NOTE — Assessment & Plan Note (Signed)
Improved Patient re-educated about  the importance of commitment to a  minimum of 150 minutes of exercise per week.  The importance of healthy food choices with portion control discussed. Encouraged to start a food diary, count calories and to consider  joining a support group. Sample diet sheets offered. Goals set by the patient for the next several months.   Weight /BMI 01/24/2015 12/07/2014 09/13/2014  WEIGHT 195 lb 0.6 oz 198 lb 199 lb  HEIGHT 5\' 1"  5\' 1"  5\' 1"   BMI 36.87 kg/m2 37.43 kg/m2 37.62 kg/m2    Current exercise per week 90 minutes.

## 2015-02-14 NOTE — Assessment & Plan Note (Signed)
Currently stable, asymptomatic

## 2015-02-14 NOTE — Assessment & Plan Note (Signed)
Importance of avoidance of NSAIDS is discussed, clinically stable disease present, wpould improve with better BP control

## 2015-04-04 ENCOUNTER — Other Ambulatory Visit: Payer: Self-pay

## 2015-04-04 MED ORDER — OMEPRAZOLE 40 MG PO CPDR: 40.0000 mg | DELAYED_RELEASE_CAPSULE | Freq: Every day | ORAL | Status: DC

## 2015-04-04 MED ORDER — CETIRIZINE HCL 10 MG PO TABS: 10.0000 mg | ORAL_TABLET | Freq: Every day | ORAL | Status: DC

## 2015-04-05 DIAGNOSIS — H40013 Open angle with borderline findings, low risk, bilateral: Secondary | ICD-10-CM | POA: Diagnosis not present

## 2015-04-05 DIAGNOSIS — H2513 Age-related nuclear cataract, bilateral: Secondary | ICD-10-CM | POA: Diagnosis not present

## 2015-04-18 ENCOUNTER — Other Ambulatory Visit: Payer: Self-pay | Admitting: Family Medicine

## 2015-04-18 ENCOUNTER — Other Ambulatory Visit: Payer: Self-pay

## 2015-04-18 DIAGNOSIS — E038 Other specified hypothyroidism: Secondary | ICD-10-CM

## 2015-04-18 MED ORDER — METOPROLOL SUCCINATE ER 100 MG PO TB24: 100.0000 mg | ORAL_TABLET | Freq: Every day | ORAL | Status: DC

## 2015-04-18 MED ORDER — VALSARTAN-HYDROCHLOROTHIAZIDE 320-25 MG PO TABS: ORAL_TABLET | ORAL | Status: DC

## 2015-04-18 MED ORDER — AMLODIPINE BESYLATE 5 MG PO TABS: 5.0000 mg | ORAL_TABLET | Freq: Every day | ORAL | Status: DC

## 2015-04-18 MED ORDER — LEVOTHYROXINE SODIUM 75 MCG PO TABS: 75.0000 ug | ORAL_TABLET | Freq: Every day | ORAL | Status: DC

## 2015-06-02 DIAGNOSIS — E038 Other specified hypothyroidism: Secondary | ICD-10-CM | POA: Diagnosis not present

## 2015-06-02 DIAGNOSIS — I10 Essential (primary) hypertension: Secondary | ICD-10-CM | POA: Diagnosis not present

## 2015-06-14 ENCOUNTER — Encounter: Payer: Self-pay | Admitting: Family Medicine

## 2015-06-14 ENCOUNTER — Other Ambulatory Visit: Payer: Self-pay | Admitting: Family Medicine

## 2015-06-22 ENCOUNTER — Other Ambulatory Visit: Payer: Self-pay

## 2015-06-22 ENCOUNTER — Telehealth: Payer: Self-pay | Admitting: Family Medicine

## 2015-06-22 DIAGNOSIS — E038 Other specified hypothyroidism: Secondary | ICD-10-CM

## 2015-06-22 MED ORDER — LEVOTHYROXINE SODIUM 75 MCG PO TABS: 75.0000 ug | ORAL_TABLET | Freq: Every day | ORAL | Status: DC

## 2015-06-22 NOTE — Telephone Encounter (Signed)
[name] [name] states send refill to [facility] and have them to deliver it to her

## 2015-06-22 NOTE — Telephone Encounter (Signed)
Spoke with patient and she states that she only received a 30 day supply in November.  Will fill for a 3 month supply and fax to pharmacy.

## 2015-06-29 ENCOUNTER — Ambulatory Visit (INDEPENDENT_AMBULATORY_CARE_PROVIDER_SITE_OTHER): Payer: Medicare Other | Admitting: Family Medicine

## 2015-06-29 ENCOUNTER — Encounter: Payer: Self-pay | Admitting: Family Medicine

## 2015-06-29 ENCOUNTER — Other Ambulatory Visit: Payer: Self-pay

## 2015-06-29 VITALS — BP 160/74 | HR 50 | Resp 18 | Ht 61.0 in | Wt 196.0 lb

## 2015-06-29 DIAGNOSIS — E038 Other specified hypothyroidism: Secondary | ICD-10-CM

## 2015-06-29 DIAGNOSIS — E785 Hyperlipidemia, unspecified: Secondary | ICD-10-CM

## 2015-06-29 DIAGNOSIS — Z23 Encounter for immunization: Secondary | ICD-10-CM

## 2015-06-29 DIAGNOSIS — R7302 Impaired glucose tolerance (oral): Secondary | ICD-10-CM

## 2015-06-29 DIAGNOSIS — E559 Vitamin D deficiency, unspecified: Secondary | ICD-10-CM

## 2015-06-29 DIAGNOSIS — M79672 Pain in left foot: Secondary | ICD-10-CM

## 2015-06-29 DIAGNOSIS — Z0000 Encounter for general adult medical examination without abnormal findings: Secondary | ICD-10-CM | POA: Diagnosis not present

## 2015-06-29 DIAGNOSIS — I10 Essential (primary) hypertension: Secondary | ICD-10-CM

## 2015-06-29 MED ORDER — KETOROLAC TROMETHAMINE 60 MG/2ML IM SOLN
30.0000 mg | Freq: Once | INTRAMUSCULAR | Status: AC
  Administered 2015-06-29: 30 mg via INTRAMUSCULAR

## 2015-06-29 MED ORDER — METHYLPREDNISOLONE ACETATE 80 MG/ML IJ SUSP
40.0000 mg | Freq: Once | INTRAMUSCULAR | Status: AC
  Administered 2015-06-29: 40 mg via INTRAMUSCULAR

## 2015-06-29 MED ORDER — ROSUVASTATIN CALCIUM 10 MG PO TABS
10.0000 mg | ORAL_TABLET | Freq: Every day | ORAL | Status: DC
Start: 2015-06-29 — End: 2015-09-22

## 2015-06-29 MED ORDER — PREDNISONE 5 MG PO TABS: 5.0000 mg | ORAL_TABLET | Freq: Two times a day (BID) | ORAL | Status: DC

## 2015-06-29 NOTE — Patient Instructions (Addendum)
Annual physical exam in 5 months, call if you need me sooner  Fasting lipid, cmp and EGFr, HBA1C, TSH and Vit D Uric Acid   Please work on good  health habits so that your health will improve. 1. Commitment to daily physical activity for 30 to 60  minutes, if you are able to do this.  2. Commitment to wise food choices. Aim for half of your  food intake to be vegetable and fruit, one quarter starchy foods, and one quarter protein. Try to eat on a regular schedule  3 meals per day, snacking between meals should be limited to vegetables or fruits or small portions of nuts. 64 ounces of water per day is generally recommended, unless you have specific health conditions, like heart failure or kidney failure where you will need to limit fluid intake.  3. Commitment to sufficient and a  good quality of physical and mental rest daily, generally between 6 to 8 hours per day.  WITH PERSISTANCE AND PERSEVERANCE, THE IMPOSSIBLE , BECOMES THE NORM!  Thanks for choosing [facility], we consider it a privelige to serve you. Flu vaccine today

## 2015-06-29 NOTE — Progress Notes (Signed)
Subjective:    Patient ID: [name] [name], female    DOB: 1941-06-11, 74 y.o.   MRN: [id number]  HPI Preventive Screening-Counseling & Management   Patient present here today for a Medicare annual wellness visit. 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 No  Over the past two weeks, have you felt little interest or pleasure in doing things? No  Have you lost interest or pleasure in daily life? No  Do you often feel hopeless? No  Do you cry easily over simple problems? No   Activities of Daily Living  In your present state of health, do you have any difficulty performing the following activities?  Driving?: No but does have anxiety about driving  Managing money?: No Feeding yourself?:No Getting from bed to chair?:No Climbing a flight of stairs?: Yes, acute foot pain  Preparing food and eating?:No Bathing or showering?:No Getting dressed?:No Getting to the toilet?:No Using the toilet?:No Moving around from place to place?: Yes, acute foot pain   Fall Risk Assessment In the past year have you fallen or had a near fall?:No Are you currently taking any medications that make you dizzy?:No   Hearing Difficulties: No Do you often ask people to speak up or repeat themselves?:No Do you  experience ringing or noises in your ears?:No Do you have difficulty understanding soft or whispered voices?:No  Cognitive Testing  Alert? Yes Normal Appearance?Yes  Oriented to person? Yes Place? Yes  Time? Yes  Displays appropriate judgment?Yes  Can read the correct time from a watch face? yes Are you having problems remembering things?No  Advanced Directives have been discussed with the patient?Yes, full code and brochure given   List the Names of Other Physician/Practitioners you currently use: careteams updated    Indicate any recent Medical Services you may have received from other than [facility] providers in the past year (date may be approximate).   Assessment:    Annual Wellness Exam   Plan:      Medicare Attestation  I have personally reviewed:  The patient's medical and social history  Their use of alcohol, tobacco or illicit drugs  Their current medications and supplements  The patient's functional ability including ADLs,fall risks, home safety risks, cognitive, and hearing and visual impairment  Diet and physical activities  Evidence for depression or mood disorders  The patient's weight, height, BMI, and visual acuity have been recorded in the chart. I have made referrals, counseling, and provided education to the patient based on review of the above and I have provided the patient with a written personalized care plan for preventive services.      Review of Systems BP 160/74 mmHg  Pulse 50  Resp 18  Ht 5\' 1"  (1.549 m)  Wt 196 lb (88.905 kg)  BMI 37.05 kg/m2  SpO2 99%     Objective:   Physical Exam BP 160/74 mmHg  Pulse 50  Resp 18  Ht 5\' 1"  (1.549 m)  Wt 196 lb (88.905 kg)  BMI 37.05 kg/m2  SpO2 99%  MS : left foot swollen on dorsum and at base  of left great toe, and tender     Assessment & Plan:  Medicare annual wellness visit, subsequent Annual exam as documented. Counseling done  re healthy lifestyle involving commitment to 150 minutes  exercise per week, heart healthy diet, and attaining healthy weight.The importance of adequate sleep also discussed. Regular seat belt use and home safety, is also discussed. Changes in health habits are decided on by the patient with goals and time frames  set for achieving them. Immunization and cancer screening needs are specifically addressed at this visit.   Foot pain, left Acute onset, no trauma. Toradol and depo medrol in office followed by 5 day course of prednisone  Essential hypertension Uncontrolled on multiple medications. No change, pt also followed by cardiology DASH diet and commitment to daily physical activity for a minimum of 30 minutes discussed and encouraged, as a part of hypertension management. The importance of attaining a healthy weight is also discussed.  BP/Weight 06/29/2015 01/24/2015 12/07/2014 09/13/2014 08/11/2014 A999333 [id number]  Systolic BP 0000000 Q000111Q 123XX123 A999333 Q000111Q 0000000 [id number]  Diastolic BP 74 70 68 74 62 70 80  Wt. (Lbs) 196 195.04 198 199 198 193 185  BMI 37.05 36.87 37.43 37.62 37.43 36.49 34.97

## 2015-06-29 NOTE — Assessment & Plan Note (Signed)
Uncontrolled on multiple medications. No change, pt also followed by cardiology DASH diet and commitment to daily physical activity for a minimum of 30 minutes discussed and encouraged, as a part of hypertension management. The importance of attaining a healthy weight is also discussed.  BP/Weight 06/29/2015 01/24/2015 12/07/2014 09/13/2014 08/11/2014 [id number] [id number]  Systolic BP 0000000 Q000111Q 123XX123 A999333 Q000111Q 0000000 [id number]  Diastolic BP 74 70 68 74 62 70 80  Wt. (Lbs) 196 195.04 198 199 198 193 185  BMI 37.05 36.87 37.43 37.62 37.43 36.49 34.97

## 2015-06-29 NOTE — Assessment & Plan Note (Signed)

## 2015-06-29 NOTE — Assessment & Plan Note (Signed)
Acute onset, no trauma. Toradol and depo medrol in office followed by 5 day course of prednisone

## 2015-07-14 ENCOUNTER — Other Ambulatory Visit: Payer: Self-pay | Admitting: Family Medicine

## 2015-08-11 ENCOUNTER — Encounter: Payer: Self-pay | Admitting: Cardiology

## 2015-08-11 ENCOUNTER — Ambulatory Visit (INDEPENDENT_AMBULATORY_CARE_PROVIDER_SITE_OTHER): Payer: Medicare Other | Admitting: Cardiology

## 2015-08-11 VITALS — BP 155/60 | HR 50 | Ht 61.0 in | Wt 198.4 lb

## 2015-08-11 DIAGNOSIS — I10 Essential (primary) hypertension: Secondary | ICD-10-CM | POA: Diagnosis not present

## 2015-08-11 DIAGNOSIS — I25810 Atherosclerosis of coronary artery bypass graft(s) without angina pectoris: Secondary | ICD-10-CM | POA: Diagnosis not present

## 2015-08-11 DIAGNOSIS — I779 Disorder of arteries and arterioles, unspecified: Secondary | ICD-10-CM

## 2015-08-11 DIAGNOSIS — I739 Peripheral vascular disease, unspecified: Secondary | ICD-10-CM

## 2015-08-11 DIAGNOSIS — E782 Mixed hyperlipidemia: Secondary | ICD-10-CM

## 2015-08-11 NOTE — Progress Notes (Signed)
Cardiology Office Note  Date: 08/11/2015   ID: [name], [name] [birth year], MRN [id number]  PCP: Tula Nakayama, MD  Primary Cardiologist: Rozann Lesches, MD   Chief Complaint  Patient presents with  . Coronary Artery Disease    History of Present Illness: [name] [name] is a 75 y.o. female former patient of Dr. Ron Parker, now establishing follow-up with me in clinic. I reviewed her records and updated the chart. She last saw Dr. Ron Parker in February 2016.  She presents today with no specific complaints of angina, and reports NYHA class II dyspnea. She remains active in her church. She states that her son has pancreatic cancer and she has been taking care of him. She does not report any major decline in her functional capacity over the last year.  We reviewed her medications which are outlined below. Cardiac regimen includes aspirin, Norvasc, omega-3 supplements, Toprol-XL, Crestor, Aldactone, and valsartan HCTZ.  Blood pressure is mildly elevated today. She reports no lapses in her medical therapy and continues to follow with Dr. Moshe Cipro.  I reviewed her previous cardiac testing and summarized it below. Her most recent carotid Dopplers were in February 2016. She is due for a follow-up study.  Her last stress test was in 2009. Dr. Ron Parker had been following her for any change in symptomatology, and I offered this as an ongoing strategy versus pursuing a follow-up stress test for ischemic burden evaluation. At this point she was most comfortable with observation.  Past Medical History  Diagnosis Date  . Obesity   . CAD (coronary artery disease)     Multivessel status post CABG October 2005  . Hyperlipidemia   . Essential hypertension   . Glaucoma     Laser surgery 2010  . Hypothyroidism   . CKD (chronic kidney disease), stage III   . Carotid artery disease [facility])     Past Surgical History  Procedure Laterality Date  . Coronary artery bypass graft  October 2005    Dr. Roxy Manns -  LIMA to LAD, SVG to OM 2 and OM 3, SVG to PDA  . Colonoscopy N/A 02/04/2013    Procedure: COLONOSCOPY;  Surgeon: Rogene Houston, MD;  Location: [facility];  Service: Endoscopy;  Laterality: N/A;  1015  . Eye surgery  2010    laser treatment to both eyes for glaucoma    Current Outpatient Prescriptions  Medication Sig Dispense Refill  . acetaminophen (TYLENOL) 500 MG tablet Take 1,000 mg by mouth every 6 (six) hours as needed for pain.    Marland Kitchen albuterol (PROAIR HFA) 108 (90 BASE) MCG/ACT inhaler INHALE ONE PUFF EVERY FOUR HOURS AS NEEDED. 8.5 g 3  . amLODipine (NORVASC) 5 MG tablet Take 1 tablet (5 mg total) by mouth daily. 90 tablet 1  . aspirin 81 MG tablet Take 81 mg by mouth daily.    . cetirizine (QC ALL DAY ALLERGY) 10 MG tablet Take 1 tablet (10 mg total) by mouth daily. 90 tablet 1  . Cholecalciferol (VITAMIN D3) 1000 UNITS CAPS Take 1 capsule by mouth daily.    . fish oil-omega-3 fatty acids 1000 MG capsule Take 2 g by mouth daily.    . fluticasone (FLONASE) 50 MCG/ACT nasal spray Place 2 sprays into both nostrils daily. (Patient taking differently: Place 2 sprays into both nostrils daily. As needed) 16 g 1  . latanoprost (XALATAN) 0.005 % ophthalmic solution Place 1 drop into both eyes at bedtime. One drop each eye at bedtime     .  levothyroxine (SYNTHROID, LEVOTHROID) 75 MCG tablet Take 1 tablet (75 mcg total) by mouth daily. 90 tablet 0  . metoprolol succinate (TOPROL-XL) 100 MG 24 hr tablet Take 1 tablet (100 mg total) by mouth daily. Take with or immediately following a meal. 90 tablet 1  . Multiple Vitamins-Minerals (THERATRUM COMPLETE 50 PLUS PO) Take 1 tablet by mouth daily.     Marland Kitchen omeprazole (PRILOSEC) 40 MG capsule TAKE ONE CAPSULE BY MOUTH DAILY. 90 capsule 1  . rosuvastatin (CRESTOR) 10 MG tablet Take 1 tablet (10 mg total) by mouth daily. 90 tablet 1  . spironolactone (ALDACTONE) 25 MG tablet TAKE ONE TABLET BY MOUTH DAILY. 90 tablet 0  . valsartan-hydrochlorothiazide  (DIOVAN-HCT) 320-25 MG tablet TAKE ONE TABLET BY MOUTH DAILY 90 tablet 1   No current facility-administered medications for this visit.   Allergies:  Clonidine derivatives   Social History: The patient  reports that she quit smoking about 12 years ago. Her smoking use included Cigarettes. She has a 12 pack-year smoking history. She has never used smokeless tobacco. She reports that she does not drink alcohol or use illicit drugs.   ROS:  Please see the history of present illness. Otherwise, complete review of systems is positive for intermittent foot pain and swelling.  All other systems are reviewed and negative.   Physical Exam: VS:  BP 155/60 mmHg  Pulse 50  Ht 5\' 1"  (1.549 m)  Wt 198 lb 6.4 oz (89.994 kg)  BMI 37.51 kg/m2  SpO2 99%, BMI Body mass index is 37.51 kg/(m^2).  Wt Readings from Last 3 Encounters:  08/11/15 198 lb 6.4 oz (89.994 kg)  06/29/15 196 lb (88.905 kg)  01/24/15 195 lb 0.6 oz (88.47 kg)    General: Overweight woman, appears comfortable at rest. HEENT: Conjunctiva and lids normal, oropharynx clear. Neck: Supple, no elevated JVP or carotid bruits, no thyromegaly. Lungs: Clear to auscultation, nonlabored breathing at rest. Cardiac: Regular rate and rhythm, no S3, 2/6 basal systolic murmur, no pericardial rub. Abdomen: Soft, nontender, bowel sounds present, no guarding or rebound. Extremities: No pitting edema, distal pulses 2+. Skin: Warm and dry. Musculoskeletal: No kyphosis. Neuropsychiatric: Alert and oriented x3, affect grossly appropriate.  ECG: I personally reviewed her prior tracing from 12/07/2014 which showed sinus bradycardia with probable repolarization abnormalities predominantly in the inferolateral leads.  Recent Labwork: 09/07/2014: ALT 16; AST 20 12/07/2014: BUN 29*; Creatinine, Ser 1.35*; Hemoglobin 10.5*; Platelets 182; Potassium 4.5; Sodium 140 01/17/2015: TSH 1.95     Component Value Date/Time   CHOL 145 01/17/2015   TRIG 125 01/17/2015    HDL 47 01/17/2015   CHOLHDL 3.3 09/07/2014 0902   VLDL 25 09/07/2014 0902   LDLCALC 73 01/17/2015    Other Studies Reviewed Today:  Carotid Dopplers 08/12/2014: Stable 40-59% bilateral ICA stenoses with greater than 50% left ECA stenosis.  Cardiac catheterization 04/11/2004 (pre-CABG):  Left main is normal.  Left anterior descending artery has a tubular 80% stenosis in the mid vessel just after a large first diagonal branch. In the distal LAD there is a 70% stenosis. The first diagonal itself has diffuse 70% stenosis proximally.  Left circumflex is a very tortuous artery. There is a very long 80-90% stenosis in the mid circumflex which extends across the origin of a large second obtuse marginal branch. The circumflex gives rise to a small first obtuse marginal, large second obtuse marginal and a large third obtuse marginal branch. The second obtuse marginal has a 40% stenosis proximally.  Right coronary  artery has a 90% stenosis in the proximal vessel followed by a tubular 60% stenosis in the mid vessel. In the distal right coronary artery there is a 60% stenosis which extends into the proximal portion of the posterior descending artery. The distal right coronary artery gives rise to a normal size posterior descending artery and a small posterolateral branch.  IMPRESSION: 1. Normal left ventricular systolic function. 2. Severe three-vessel coronary artery disease.  Adenosine Cardiolite 01/21/2008 [facility]): Abnormal ST segment changes observed with adenosine, fixed lateral wall defect, TID ratio 1.23, LVEF 75%.  Assessment and Plan:  1. Multivessel CAD status post CABG in 2005. She has done very well symptomatically speaking, denies any decline in functional capacity, no exertional chest tightness, and NYHA class II dyspnea with typical activities. She reports comfort with observation on medical therapy, although I did offer her a follow-up stress  test since her last evaluation was in 2009. She will let us know if she develops any symptoms in the meanwhile.  2. Essential hypertension, blood pressure is elevated today. She reports compliance with her medications and continues to follow up with Dr. Moshe Cipro.  3. Hyperlipidemia, continues on Crestor with most recent LDL 73.  4. Bilateral carotid artery disease, moderate. She is due for follow-up carotid Dopplers.  Current medicines were reviewed with the patient today.  Disposition: FU with me in 1 year.   Signed, Satira Sark, MD, [facility] 08/11/2015 2:26 PM    [facility] [address], [place], [place] [address] Phone: [phone]; Fax: [phone]

## 2015-08-11 NOTE — Patient Instructions (Signed)
Your physician recommends that you continue on your current medications as directed. Please refer to the Current Medication list given to you today. Your physician has requested that you have a carotid duplex. This test is an ultrasound of the carotid arteries in your neck. It looks at blood flow through these arteries that supply the brain with blood. Allow one hour for this exam. There are no restrictions or special instructions. Your physician recommends that you schedule a follow-up appointment in: 1 year. You will receive a reminder letter in the mail in about 10 months reminding you to call and schedule your appointment. If you don't receive this letter, please contact our office.

## 2015-08-24 ENCOUNTER — Ambulatory Visit: Payer: Medicare Other

## 2015-08-24 DIAGNOSIS — I6523 Occlusion and stenosis of bilateral carotid arteries: Secondary | ICD-10-CM | POA: Diagnosis not present

## 2015-08-24 DIAGNOSIS — I739 Peripheral vascular disease, unspecified: Principal | ICD-10-CM

## 2015-08-24 DIAGNOSIS — I779 Disorder of arteries and arterioles, unspecified: Secondary | ICD-10-CM

## 2015-08-31 ENCOUNTER — Telehealth: Payer: Self-pay | Admitting: *Deleted

## 2015-08-31 DIAGNOSIS — I779 Disorder of arteries and arterioles, unspecified: Secondary | ICD-10-CM

## 2015-08-31 DIAGNOSIS — I739 Peripheral vascular disease, unspecified: Principal | ICD-10-CM

## 2015-08-31 NOTE — Telephone Encounter (Signed)
-----   Message from Satira Sark, MD sent at 08/25/2015  8:19 PM EST ----- Reviewed report. 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 Follow-up study in one year.

## 2015-08-31 NOTE — Telephone Encounter (Signed)
Patient informed. 

## 2015-09-02 ENCOUNTER — Other Ambulatory Visit: Payer: Self-pay | Admitting: Family Medicine

## 2015-09-22 ENCOUNTER — Other Ambulatory Visit: Payer: Self-pay | Admitting: Family Medicine

## 2015-09-23 ENCOUNTER — Other Ambulatory Visit: Payer: Self-pay | Admitting: Family Medicine

## 2015-10-06 ENCOUNTER — Other Ambulatory Visit: Payer: Self-pay | Admitting: Family Medicine

## 2015-11-01 ENCOUNTER — Other Ambulatory Visit: Payer: Self-pay | Admitting: Family Medicine

## 2015-11-01 DIAGNOSIS — Z1231 Encounter for screening mammogram for malignant neoplasm of breast: Secondary | ICD-10-CM

## 2015-11-30 ENCOUNTER — Telehealth: Payer: Self-pay | Admitting: Family Medicine

## 2015-11-30 MED ORDER — OMEPRAZOLE 40 MG PO CPDR: 40.0000 mg | DELAYED_RELEASE_CAPSULE | Freq: Every day | ORAL | Status: DC

## 2015-11-30 NOTE — Telephone Encounter (Signed)
Med refilled.

## 2015-11-30 NOTE — Telephone Encounter (Signed)
Lorian is asking for a refill on her omeprazole (PRILOSEC) 40 MG capsule called to [facility]

## 2015-12-05 ENCOUNTER — Ambulatory Visit (HOSPITAL_COMMUNITY)
Admission: RE | Admit: 2015-12-05 | Discharge: 2015-12-05 | Disposition: A | Discharge status: Home / Self Care | Payer: Medicare Other | Source: Ambulatory Visit | Attending: Family Medicine | Admitting: Family Medicine

## 2015-12-05 ENCOUNTER — Telehealth: Payer: Self-pay

## 2015-12-05 DIAGNOSIS — M79609 Pain in unspecified limb: Secondary | ICD-10-CM | POA: Diagnosis not present

## 2015-12-05 DIAGNOSIS — E559 Vitamin D deficiency, unspecified: Secondary | ICD-10-CM | POA: Diagnosis not present

## 2015-12-05 DIAGNOSIS — E038 Other specified hypothyroidism: Secondary | ICD-10-CM | POA: Diagnosis not present

## 2015-12-05 DIAGNOSIS — E785 Hyperlipidemia, unspecified: Secondary | ICD-10-CM | POA: Diagnosis not present

## 2015-12-05 DIAGNOSIS — Z1231 Encounter for screening mammogram for malignant neoplasm of breast: Secondary | ICD-10-CM

## 2015-12-05 DIAGNOSIS — R7302 Impaired glucose tolerance (oral): Secondary | ICD-10-CM | POA: Diagnosis not present

## 2015-12-05 DIAGNOSIS — I10 Essential (primary) hypertension: Secondary | ICD-10-CM | POA: Diagnosis not present

## 2015-12-05 LAB — HEMOGLOBIN A1C
Hgb A1c MFr Bld: 6 % — ABNORMAL HIGH (ref <5.7)
MEAN PLASMA GLUCOSE: 126 mg/dL

## 2015-12-06 LAB — COMPLETE METABOLIC PANEL WITH GFR
ALT: 15 U/L (ref 6–29)
AST: 21 U/L (ref 10–35)
Albumin: 4.4 g/dL (ref 3.6–5.1)
Alkaline Phosphatase: 63 U/L (ref 33–130)
BILIRUBIN TOTAL: 0.5 mg/dL (ref 0.2–1.2)
BUN: 31 mg/dL — AB (ref 7–25)
CALCIUM: 10.5 mg/dL — AB (ref 8.6–10.4)
CHLORIDE: 108 mmol/L (ref 98–110)
CO2: 22 mmol/L (ref 20–31)
CREATININE: 1.42 mg/dL — AB (ref 0.60–0.93)
GFR, Est African American: 42 mL/min — ABNORMAL LOW (ref >=60)
GFR, Est Non African American: 37 mL/min — ABNORMAL LOW (ref >=60)
Glucose, Bld: 90 mg/dL (ref 65–99)
Potassium: 5.3 mmol/L (ref 3.5–5.3)
Sodium: 142 mmol/L (ref 135–146)
TOTAL PROTEIN: 7.8 g/dL (ref 6.1–8.1)

## 2015-12-06 LAB — LIPID PANEL
CHOL/HDL RATIO: 2.7 ratio (ref <=5.0)
Cholesterol: 134 mg/dL (ref 125–200)
HDL: 49 mg/dL (ref >=46)
LDL Cholesterol: 58 mg/dL (ref <130)
TRIGLYCERIDES: 135 mg/dL (ref <150)
VLDL: 27 mg/dL (ref <30)

## 2015-12-06 LAB — TSH: TSH: 3.11 m[IU]/L

## 2015-12-06 LAB — URIC ACID: Uric Acid, Serum: 8.1 mg/dL — ABNORMAL HIGH (ref 2.5–7.0)

## 2015-12-06 LAB — VITAMIN D 25 HYDROXY (VIT D DEFICIENCY, FRACTURES): VIT D 25 HYDROXY: 56 ng/mL (ref 30–100)

## 2015-12-06 NOTE — Telephone Encounter (Signed)
I would be happy to see her for that! Please arrange. WS

## 2015-12-07 ENCOUNTER — Encounter: Payer: Medicare Other | Admitting: Family Medicine

## 2015-12-07 NOTE — Telephone Encounter (Signed)
Patient is appreciative for offer however does not have transportation to get to office.  She will await return of PCP.

## 2015-12-12 ENCOUNTER — Telehealth: Payer: Self-pay | Admitting: Family Medicine

## 2015-12-12 MED ORDER — ALPRAZOLAM 0.25 MG PO TABS: 0.2500 mg | ORAL_TABLET | Freq: Every evening | ORAL | Status: DC | PRN

## 2015-12-12 NOTE — Telephone Encounter (Signed)
Patient aware and med sent  

## 2015-12-12 NOTE — Telephone Encounter (Signed)
Patient is asking if Dr. Moshe Cipro would give her a mild sedative to help with her nerves she lost her son, she is planning on keeping her appointment on Friday, she states she has a lot to do but she is having a hard time staying focused, please advise?

## 2015-12-12 NOTE — Telephone Encounter (Signed)
Advise sorry to hear, med sent

## 2015-12-16 ENCOUNTER — Other Ambulatory Visit (HOSPITAL_COMMUNITY)
Admission: RE | Admit: 2015-12-16 | Discharge: 2015-12-16 | Disposition: A | Discharge status: Home / Self Care | Payer: Medicare Other | Source: Ambulatory Visit | Attending: Family Medicine | Admitting: Family Medicine

## 2015-12-16 ENCOUNTER — Ambulatory Visit (INDEPENDENT_AMBULATORY_CARE_PROVIDER_SITE_OTHER): Payer: Medicare Other | Admitting: Family Medicine

## 2015-12-16 ENCOUNTER — Encounter: Payer: Self-pay | Admitting: Family Medicine

## 2015-12-16 VITALS — BP 140/70 | HR 55 | Resp 16 | Ht 61.0 in | Wt 199.0 lb

## 2015-12-16 DIAGNOSIS — R7301 Impaired fasting glucose: Secondary | ICD-10-CM | POA: Diagnosis not present

## 2015-12-16 DIAGNOSIS — Z0000 Encounter for general adult medical examination without abnormal findings: Secondary | ICD-10-CM | POA: Insufficient documentation

## 2015-12-16 DIAGNOSIS — Z124 Encounter for screening for malignant neoplasm of cervix: Secondary | ICD-10-CM | POA: Insufficient documentation

## 2015-12-16 DIAGNOSIS — E038 Other specified hypothyroidism: Secondary | ICD-10-CM | POA: Diagnosis not present

## 2015-12-16 DIAGNOSIS — Z1211 Encounter for screening for malignant neoplasm of colon: Secondary | ICD-10-CM

## 2015-12-16 DIAGNOSIS — I10 Essential (primary) hypertension: Secondary | ICD-10-CM

## 2015-12-16 LAB — HEMOCCULT GUIAC POC 1CARD (OFFICE): Fecal Occult Blood, POC: NEGATIVE

## 2015-12-16 NOTE — Assessment & Plan Note (Signed)
Annual exam as documented. Counseling done  re healthy lifestyle involving commitment to 150 minutes exercise per week, heart healthy diet, and attaining healthy weight.The importance of adequate sleep also discussed.  Immunization and cancer screening needs are specifically addressed at this visit.  

## 2015-12-16 NOTE — Patient Instructions (Addendum)
F/u in 4.5  month, call if you need me sooner  Excellent labs, no change in medications  Non fast cmp and EGFr, hBAS1C and TSH in 4.5 month  Our condolence and support at this difficult time  Continue current medications

## 2015-12-16 NOTE — Progress Notes (Signed)
   Subjective:    Patient ID: [name] [name], female    DOB: 03-31-1941, 75 y.o.   MRN: [id number]  HPI Patient is in for annual physical exam. No other health concerns are expressed or addressed at the visit. Recent labs, if available are reviewed. Immunization is reviewed , and  updated if needed.   Assessment:      Plan:       Review of Systems    See HPI   Objective:   Physical Exam  BP 140/70 mmHg  Pulse 55  Resp 16  Ht 5\' 1"  (1.549 m)  Wt 199 lb (90.266 kg)  BMI 37.62 kg/m2  SpO2 98%  Pleasant well nourished female, alert and oriented x 3, in no cardio-pulmonary distress. Afebrile. HEENT No facial trauma or asymetry. Sinuses non tender.  Extra occullar muscles intact, . External ears normal, tympanic membranes clear. Oropharynx moist, no exudate, fair dentition. Neck: supple, no adenopathy,JVD or thyromegaly.No bruits.  Chest: Clear to ascultation bilaterally.No crackles or wheezes. Non tender to palpation  Breast: No asymetry,no masses or lumps. No tenderness. No nipple discharge or inversion. No axillary or supraclavicular adenopathy  Cardiovascular system; Heart sounds normal,  S1 and  S2 ,no S3.  No murmur, or thrill. Apical beat not displaced Peripheral pulses normal. 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 No bruits. Bowel sounds normal. No guarding, tenderness or rebound.  Rectal:  Normal sphincter tone. No mass.No rectal masses.  Guaiac negative stool.  GU: External genitalia normal female genitalia , female distribution of hair. No lesions. Urethral meatus normal in size, no  Prolapse, no lesions visibly  Present. Bladder non tender. Vagina pink and moist , with no visible lesions , discharge present . Adequate pelvic support no  cystocele or rectocele noted Cervix pink and appears healthy, no lesions or ulcerations noted, no discharge noted from os Uterus atrophic,  no adnexal masses, no cervical motion or adnexal tenderness.   Musculoskeletal exam: Decreased though adequate  ROM of spine, hips , shoulders and knees. No deformity ,swelling or crepitus noted. No muscle wasting or atrophy.   Neurologic: Cranial nerves 2 to 12 intact. Power, tone ,sensation and reflexes normal throughout. No disturbance in gait. No tremor.  Skin: Intact, no ulceration, erythema , scaling or rash noted. Pigmentation normal throughout  Psych; Normal mood and affect. Judgement and concentration normal       Assessment & Plan:  Annual physical exam Annual exam as documented. Counseling done  re healthy lifestyle involving commitment to 150 minutes exercise per week, heart healthy diet, and attaining healthy weight.The importance of adequate sleep also discussed. . Immunization and cancer screening needs are specifically addressed at this visit.

## 2015-12-19 LAB — CYTOLOGY - PAP

## 2015-12-26 ENCOUNTER — Other Ambulatory Visit: Payer: Self-pay | Admitting: Family Medicine

## 2016-01-06 ENCOUNTER — Other Ambulatory Visit: Payer: Self-pay | Admitting: Family Medicine

## 2016-01-07 ENCOUNTER — Other Ambulatory Visit: Payer: Self-pay | Admitting: Family Medicine

## 2016-01-11 ENCOUNTER — Other Ambulatory Visit: Payer: Self-pay | Admitting: Family Medicine

## 2016-01-12 ENCOUNTER — Other Ambulatory Visit: Payer: Self-pay

## 2016-01-12 MED ORDER — METOPROLOL SUCCINATE ER 100 MG PO TB24: 100.0000 mg | ORAL_TABLET | Freq: Every day | ORAL | Status: DC

## 2016-01-12 MED ORDER — SPIRONOLACTONE 25 MG PO TABS: 25.0000 mg | ORAL_TABLET | Freq: Every day | ORAL | Status: DC

## 2016-02-15 ENCOUNTER — Other Ambulatory Visit: Payer: Self-pay

## 2016-02-15 ENCOUNTER — Telehealth: Payer: Self-pay

## 2016-02-15 MED ORDER — ALBUTEROL SULFATE HFA 108 (90 BASE) MCG/ACT IN AERS: INHALATION_SPRAY | RESPIRATORY_TRACT | 3 refills | Status: DC

## 2016-02-15 MED ORDER — PREDNISONE 5 MG PO TABS: 5.0000 mg | ORAL_TABLET | Freq: Two times a day (BID) | ORAL | 0 refills | Status: DC

## 2016-02-15 NOTE — Telephone Encounter (Signed)
Advise oTC robitussin dM and pls send prednisone 5 mg one twice daily for 5 days only also

## 2016-02-15 NOTE — Telephone Encounter (Signed)
Patient aware and medication sent to pharmacy  

## 2016-03-14 ENCOUNTER — Other Ambulatory Visit: Payer: Self-pay | Admitting: Family Medicine

## 2016-03-14 ENCOUNTER — Telehealth: Payer: Self-pay | Admitting: Family Medicine

## 2016-03-14 MED ORDER — TRAMADOL HCL 50 MG PO TABS: ORAL_TABLET | ORAL | 0 refills | Status: DC

## 2016-03-14 NOTE — Telephone Encounter (Signed)
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 Moshe Cipro can call her in something for pain, please advise?

## 2016-03-14 NOTE — Telephone Encounter (Signed)
Patient aware that she just recently had Prednisone.  Please advise if there is anything else that can be prescribed.

## 2016-03-14 NOTE — Telephone Encounter (Signed)
Medication faxed and patient aware.

## 2016-03-14 NOTE — Telephone Encounter (Signed)
Tramadol printed , pls let her know and send

## 2016-03-16 ENCOUNTER — Other Ambulatory Visit: Payer: Self-pay | Admitting: Family Medicine

## 2016-03-27 ENCOUNTER — Other Ambulatory Visit: Payer: Self-pay | Admitting: Family Medicine

## 2016-04-02 ENCOUNTER — Other Ambulatory Visit: Payer: Self-pay | Admitting: Family Medicine

## 2016-04-12 DIAGNOSIS — E119 Type 2 diabetes mellitus without complications: Secondary | ICD-10-CM | POA: Diagnosis not present

## 2016-04-12 DIAGNOSIS — H2513 Age-related nuclear cataract, bilateral: Secondary | ICD-10-CM | POA: Diagnosis not present

## 2016-04-12 DIAGNOSIS — H40053 Ocular hypertension, bilateral: Secondary | ICD-10-CM | POA: Diagnosis not present

## 2016-04-12 LAB — HM DIABETES EYE EXAM

## 2016-04-16 ENCOUNTER — Other Ambulatory Visit: Payer: Self-pay | Admitting: Family Medicine

## 2016-04-18 ENCOUNTER — Other Ambulatory Visit: Payer: Self-pay

## 2016-04-18 ENCOUNTER — Telehealth: Payer: Self-pay | Admitting: Family Medicine

## 2016-04-18 MED ORDER — CETIRIZINE HCL 10 MG PO TABS: 10.0000 mg | ORAL_TABLET | Freq: Every day | ORAL | 1 refills | Status: DC

## 2016-04-18 NOTE — Telephone Encounter (Signed)
Medication refilled

## 2016-04-18 NOTE — Telephone Encounter (Signed)
[name] [name] is calling requesting a refill on cetirizine (ZYRTEC) 10 MG tablet to be sent to [facility]

## 2016-05-02 ENCOUNTER — Encounter: Payer: Self-pay | Admitting: Family Medicine

## 2016-05-02 ENCOUNTER — Ambulatory Visit (INDEPENDENT_AMBULATORY_CARE_PROVIDER_SITE_OTHER): Payer: Medicare Other | Admitting: Family Medicine

## 2016-05-02 VITALS — BP 140/78 | HR 50 | Ht 61.0 in | Wt 205.0 lb

## 2016-05-02 DIAGNOSIS — Z23 Encounter for immunization: Secondary | ICD-10-CM | POA: Diagnosis not present

## 2016-05-02 DIAGNOSIS — M549 Dorsalgia, unspecified: Secondary | ICD-10-CM | POA: Diagnosis not present

## 2016-05-02 DIAGNOSIS — I10 Essential (primary) hypertension: Secondary | ICD-10-CM | POA: Diagnosis not present

## 2016-05-02 MED ORDER — PREDNISONE 5 MG PO TABS: 5.0000 mg | ORAL_TABLET | Freq: Two times a day (BID) | ORAL | 0 refills | Status: AC

## 2016-05-02 NOTE — Patient Instructions (Addendum)
F/u as before, call if you need me sooner  Flu vaccine today ' Two injections in office today for right buttock and thigh pain and 5 day copurse of prednisone prescribed  Please get X ray of low back at [facility], order has beeen entered  Prednisone for 5 days sent to your pharmacy  Flu vaccine today

## 2016-05-02 NOTE — Assessment & Plan Note (Signed)
After obtaining informed consent, the vaccine is  administered by LPN.  

## 2016-05-04 DIAGNOSIS — R269 Unspecified abnormalities of gait and mobility: Secondary | ICD-10-CM | POA: Diagnosis not present

## 2016-05-04 DIAGNOSIS — M1990 Unspecified osteoarthritis, unspecified site: Secondary | ICD-10-CM | POA: Diagnosis not present

## 2016-05-04 NOTE — Assessment & Plan Note (Signed)
Uncontrolled.Toradol and depo medrol administered IM in the office , to be followed by a short course of oral prednisone and NSAIDS. Needs assistive device, script for cane provided

## 2016-05-04 NOTE — Progress Notes (Signed)
   KAYRON KALMAR     MRN: [id number]      DOB: 12-May-1941   HPI Ms. [name] [name] is here with a 6 day h/o severe lower back and right hip pain, which markedly reduced her mobility, for the initial 2 days. Much improved, but anxious as she has long car rde over the Thanksgiving Holiday, and also requesting a cane for safe mobility Denies lower extremity weakness, numbness or incontinence of stool or urine ROS Denies recent fever or chills. Denies sinus pressure, nasal congestion, ear pain or sore throat. Denies chest congestion, productive cough or wheezing. Denies chest pains, palpitations and leg swelling Denies headaches, seizures, numbness, or tingling. Denies depression, anxiety or insomnia. Denies skin break down or rash.   PE  BP 140/78   Pulse (!) 50   Ht 5\' 1"  (1.549 m)   Wt 205 lb (93 kg)   SpO2 96%   BMI 38.73 kg/m   Patient alert and oriented and in no cardiopulmonary distress.  HEENT: No facial asymmetry, EOMI,   oropharynx pink and moist.  Neck supple no JVD, no mass.  Chest: Clear to auscultation bilaterally.  CVS: S1, S2 no murmurs, no S3.Regular rate.  ABD: Soft non tender.   Ext: No edema  MS: Decreased  ROM spine, and  hips and knees.  Skin: Intact, no ulcerations or rash noted.  Psych: Good eye contact, normal affect. Memory intact not anxious or depressed appearing.  CNS: CN 2-12 intact, power,  normal throughout.no focal deficits noted.   Assessment & Plan  Need for prophylactic vaccination and inoculation against influenza After obtaining informed consent, the vaccine is  administered by LPN.   Back pain with radiation Uncontrolled.Toradol and depo medrol administered IM in the office , to be followed by a short course of oral prednisone and NSAIDS. Needs assistive device, script for cane provided  Essential hypertension Controlled, no change in medication

## 2016-05-04 NOTE — Assessment & Plan Note (Signed)
Controlled, no change in medication  

## 2016-05-17 ENCOUNTER — Ambulatory Visit (HOSPITAL_COMMUNITY)
Admission: RE | Admit: 2016-05-17 | Discharge: 2016-05-17 | Disposition: A | Discharge status: Home / Self Care | Payer: Medicare Other | Source: Ambulatory Visit | Attending: Family Medicine | Admitting: Family Medicine

## 2016-05-17 DIAGNOSIS — M549 Dorsalgia, unspecified: Secondary | ICD-10-CM | POA: Diagnosis present

## 2016-05-17 DIAGNOSIS — M419 Scoliosis, unspecified: Secondary | ICD-10-CM | POA: Insufficient documentation

## 2016-05-17 DIAGNOSIS — M545 Low back pain: Secondary | ICD-10-CM | POA: Diagnosis not present

## 2016-05-17 DIAGNOSIS — R7301 Impaired fasting glucose: Secondary | ICD-10-CM | POA: Diagnosis not present

## 2016-05-17 DIAGNOSIS — I700 Atherosclerosis of aorta: Secondary | ICD-10-CM | POA: Diagnosis not present

## 2016-05-17 DIAGNOSIS — E038 Other specified hypothyroidism: Secondary | ICD-10-CM | POA: Diagnosis not present

## 2016-05-17 DIAGNOSIS — I10 Essential (primary) hypertension: Secondary | ICD-10-CM | POA: Diagnosis not present

## 2016-05-17 LAB — COMPLETE METABOLIC PANEL WITH GFR
ALK PHOS: 72 U/L (ref 33–130)
ALT: 17 U/L (ref 6–29)
AST: 19 U/L (ref 10–35)
Albumin: 4.2 g/dL (ref 3.6–5.1)
BILIRUBIN TOTAL: 0.3 mg/dL (ref 0.2–1.2)
BUN: 41 mg/dL — ABNORMAL HIGH (ref 7–25)
CO2: 22 mmol/L (ref 20–31)
Calcium: 9.8 mg/dL (ref 8.6–10.4)
Chloride: 109 mmol/L (ref 98–110)
Creat: 1.65 mg/dL — ABNORMAL HIGH (ref 0.60–0.93)
GFR, EST AFRICAN AMERICAN: 35 mL/min — AB (ref >=60)
GFR, EST NON AFRICAN AMERICAN: 31 mL/min — AB (ref >=60)
GLUCOSE: 100 mg/dL — AB (ref 65–99)
POTASSIUM: 6 mmol/L — AB (ref 3.5–5.3)
SODIUM: 138 mmol/L (ref 135–146)
TOTAL PROTEIN: 7.5 g/dL (ref 6.1–8.1)

## 2016-05-17 LAB — TSH: TSH: 2.79 m[IU]/L

## 2016-05-17 LAB — HEMOGLOBIN A1C
Hgb A1c MFr Bld: 5.6 % (ref <5.7)
MEAN PLASMA GLUCOSE: 114 mg/dL

## 2016-05-18 ENCOUNTER — Telehealth: Payer: Self-pay

## 2016-05-18 ENCOUNTER — Other Ambulatory Visit: Payer: Self-pay | Admitting: Family Medicine

## 2016-05-18 DIAGNOSIS — N184 Chronic kidney disease, stage 4 (severe): Secondary | ICD-10-CM

## 2016-05-18 DIAGNOSIS — E875 Hyperkalemia: Secondary | ICD-10-CM

## 2016-05-18 MED ORDER — SODIUM POLYSTYRENE SULFONATE 15 GM/60ML PO SUSP: 30.0000 g | Freq: Once | ORAL | 0 refills | Status: AC

## 2016-05-18 NOTE — Telephone Encounter (Signed)
-----   Message from Fayrene Helper, MD sent at 05/18/2016  5:39 AM EST ----- Continue thyroid med at same dose, blood sugar now normal which is excellent INCREASE water intake , ensure 64 ounces daily No tramadol for pain , only tylenol Kidney function is not as good as in the past, due to high blood pressure, I recommend she see nephrologist, kidney specialist , for help with medical mx to preserve kidneys , I have referred to Dr Birdie Sons NEEDS kaxexalate t lower potassium, dose entered, pls send, will need rept potassium level drawn also Nothing fo pain other than tylenol, no aleve, ibuprofen etc

## 2016-05-19 ENCOUNTER — Telehealth: Payer: Self-pay | Admitting: Family Medicine

## 2016-05-19 DIAGNOSIS — E875 Hyperkalemia: Secondary | ICD-10-CM | POA: Diagnosis not present

## 2016-05-19 NOTE — Telephone Encounter (Signed)
I spoke directly with pt , and ,made her aware labs are improve , with potassium 5.3, BUN 39 and creatinine 1.44. Reminded her of importance of following up with referral to nephrology, she agrees, and verbalizes understanding

## 2016-05-30 ENCOUNTER — Encounter: Payer: Self-pay | Admitting: Family Medicine

## 2016-05-30 ENCOUNTER — Ambulatory Visit (INDEPENDENT_AMBULATORY_CARE_PROVIDER_SITE_OTHER): Payer: Medicare Other | Admitting: Family Medicine

## 2016-05-30 VITALS — BP 158/80 | HR 55 | Resp 16 | Ht 61.0 in | Wt 205.0 lb

## 2016-05-30 DIAGNOSIS — E785 Hyperlipidemia, unspecified: Secondary | ICD-10-CM

## 2016-05-30 DIAGNOSIS — J302 Other seasonal allergic rhinitis: Secondary | ICD-10-CM

## 2016-05-30 DIAGNOSIS — I10 Essential (primary) hypertension: Secondary | ICD-10-CM | POA: Diagnosis not present

## 2016-05-30 DIAGNOSIS — N189 Chronic kidney disease, unspecified: Secondary | ICD-10-CM

## 2016-05-30 DIAGNOSIS — R7302 Impaired glucose tolerance (oral): Secondary | ICD-10-CM

## 2016-05-30 LAB — BASIC METABOLIC PANEL WITH GFR
BUN: 42 mg/dL — AB (ref 7–25)
CHLORIDE: 108 mmol/L (ref 98–110)
CO2: 20 mmol/L (ref 20–31)
CREATININE: 1.55 mg/dL — AB (ref 0.60–0.93)
Calcium: 9.9 mg/dL (ref 8.6–10.4)
GFR, Est African American: 38 mL/min — ABNORMAL LOW (ref >=60)
GFR, Est Non African American: 33 mL/min — ABNORMAL LOW (ref >=60)
Glucose, Bld: 98 mg/dL (ref 65–99)
POTASSIUM: 5.6 mmol/L — AB (ref 3.5–5.3)
Sodium: 137 mmol/L (ref 135–146)

## 2016-05-30 NOTE — Patient Instructions (Signed)
F/u in 4 month, call if you need me sooner   Lab today  Fasting labs in 4 months    You will see kidney specialist Dr Birdie Sons, appt info to be given  You are referred fo or [place] of your kidneys, we will call with appt info  Thank you  for choosing [facility]. We consider it a privelige to serve you.  Delivering excellent health care in a caring and  compassionate way is our goal.  Partnering with you,  so that together we can achieve this goal is our strategy.

## 2016-06-03 ENCOUNTER — Encounter: Payer: Self-pay | Admitting: Family Medicine

## 2016-06-03 NOTE — Progress Notes (Signed)
[name] [name]     MRN: [id number]      DOB: May 29, 1941   HPI [name] [name] is here for follow up and re-evaluation of chronic medical conditions, medication management and review of any available recent lab and radiology data.  Preventive health is updated, specifically  Cancer screening and Immunization.   Questions or concerns regarding consultations or procedures which the PT has had in the interim are  addressed. The PT denies any adverse reactions to current medications since the last visit.  There are no new concerns. Has questions about her impaired kidney function, I explain the underlyingHproblem is uncontrolled HTN She is also referred to nephrology and the nugs is explained. eed to avoid NSAIDS and any potentially nephrotoxic dr ROS Denies recent fever or chills. Denies sinus pressure, nasal congestion, ear pain or sore throat. Denies chest congestion, productive cough or wheezing. Denies chest pains, palpitations and leg swelling Denies abdominal pain, nausea, vomiting,diarrhea or constipation.   Denies dysuria, frequency, hesitancy or incontinence. Denies joint pain, swelling and limitation in mobility. Denies headaches, seizures, numbness, or tingling. Denies depression, anxiety or insomnia. Denies skin break down or rash.   PE  BP (!) 158/80   Pulse (!) 55   Resp 16   Ht 5\' 1"  (1.549 m)   Wt 205 lb (93 kg)   SpO2 98%   BMI 38.73 kg/m   Patient alert and oriented and in no cardiopulmonary distress.  HEENT: No facial asymmetry, EOMI,   oropharynx pink and moist.  Neck supple no JVD, no mass.  Chest: Clear to auscultation bilaterally.  CVS: S1, S2 no murmurs, no S3.Regular rate.  ABD: Soft non tender.   Ext: No edema  MS: Adequate ROM spine, shoulders, hips and knees.  Skin: Intact, no ulcerations or rash noted.  Psych: Good eye contact, normal affect. Memory intact not anxious or depressed appearing.  CNS: CN 2-12 intact, power,  normal throughout.no  focal deficits noted.   Assessment & Plan  CKD (chronic kidney disease) deteriorated with hyperkalemia, will review potassium level post kayexelate x 1 , needs nephrology input. US renal also  Essential hypertension Uncontrolled despite multiple meds, welcome nephrology input, no changes at this time DASH diet and commitment to daily physical activity for a minimum of 30 minutes discussed and encouraged, as a part of hypertension management. The importance of attaining a healthy weight is also discussed.  BP/Weight 05/30/2016 05/02/2016 12/16/2015 08/11/2015 06/29/2015 09/22/4008 08/08/2534  Systolic BP 644 034 742 595 638 756 433  Diastolic BP 80 78 70 60 74 70 68  Wt. (Lbs) 205 205 199 198.4 196 195.04 198  BMI 38.73 38.73 37.62 37.51 37.05 36.87 37.43       Morbid obesity Deteriorated. Patient re-educated about  the importance of commitment to a  minimum of 150 minutes of exercise per week.  The importance of healthy food choices with portion control discussed. Encouraged to start a food diary, count calories and to consider  joining a support group. Sample diet sheets offered. Goals set by the patient for the next several months.   Weight /BMI 05/30/2016 05/02/2016 12/16/2015  WEIGHT 205 lb 205 lb 199 lb  HEIGHT 5\' 1"  5\' 1"  5\' 1"   BMI 38.73 kg/m2 38.73 kg/m2 37.62 kg/m2      Hyperlipidemia LDL goal <70 Hyperlipidemia:Low fat diet discussed and encouraged.   Lipid Panel  Lab Results  Component Value Date   CHOL 134 12/05/2015   HDL 49 12/05/2015   [place]  58 12/05/2015   TRIG 135 12/05/2015   CHOLHDL 2.7 12/05/2015   Controlled, no change in medication Updated lab needed at/ before next visit.     IGT (impaired glucose tolerance) Patient educated about the importance of limiting  Carbohydrate intake , the need to commit to daily physical activity for a minimum of 30 minutes , and to commit weight loss. The fact that changes in all these areas will reduce or  eliminate all together the development of diabetes is stressed.   Diabetic Labs Latest Ref Rng & Units 05/30/2016 05/17/2016 12/05/2015 01/17/2015 12/07/2014  HbA1c <5.7 % - 5.6 6.0(H) 6.0 -  Microalbumin 0.00 - 1.89 mg/dL - - - - -  Micro/Creat Ratio 0.0 - 30.0 mg/g - - - - -  Chol 125 - 200 mg/dL - - 134 145 -  HDL >=46 mg/dL - - 49 47 -  Calc LDL <130 mg/dL - - 58 73 -  Triglycerides <150 mg/dL - - 135 125 -  Creatinine 0.60 - 0.93 mg/dL 1.55(H) 1.65(H) 1.42(H) - 1.35(H)   BP/Weight 05/30/2016 05/02/2016 12/16/2015 08/11/2015 06/29/2015 9/32/3557 09/01/2023  Systolic BP 427 062 376 283 151 761 607  Diastolic BP 80 78 70 60 74 70 68  Wt. (Lbs) 205 205 199 198.4 196 195.04 198  BMI 38.73 38.73 37.62 37.51 37.05 36.87 37.43   Foot/eye exam completion dates Latest Ref Rng & Units 04/12/2016 10/05/2014  Eye Exam No Retinopathy No Retinopathy No Retinopathy  Foot exam Order - - -  Foot Form Completion - - -      Allergic rhinitis Controlled, no change in medication

## 2016-06-03 NOTE — Assessment & Plan Note (Signed)
Hyperlipidemia:Low fat diet discussed and encouraged.   Lipid Panel  Lab Results  Component Value Date   CHOL 134 12/05/2015   HDL 49 12/05/2015   LDLCALC 58 12/05/2015   TRIG 135 12/05/2015   CHOLHDL 2.7 12/05/2015   Controlled, no change in medication Updated lab needed at/ before next visit.

## 2016-06-03 NOTE — Assessment & Plan Note (Signed)
Patient educated about the importance of limiting  Carbohydrate intake , the need to commit to daily physical activity for a minimum of 30 minutes , and to commit weight loss. The fact that changes in all these areas will reduce or eliminate all together the development of diabetes is stressed.   Diabetic Labs Latest Ref Rng & Units 05/30/2016 05/17/2016 12/05/2015 01/17/2015 12/07/2014  HbA1c <5.7 % - 5.6 6.0(H) 6.0 -  Microalbumin 0.00 - 1.89 mg/dL - - - - -  Micro/Creat Ratio 0.0 - 30.0 mg/g - - - - -  Chol 125 - 200 mg/dL - - 134 145 -  HDL >=46 mg/dL - - 49 47 -  Calc LDL <130 mg/dL - - 58 73 -  Triglycerides <150 mg/dL - - 135 125 -  Creatinine 0.60 - 0.93 mg/dL 1.55(H) 1.65(H) 1.42(H) - 1.35(H)   BP/Weight 05/30/2016 05/02/2016 12/16/2015 08/11/2015 06/29/2015 8/61/6837 08/10/209  Systolic BP 155 208 022 336 122 449 753  Diastolic BP 80 78 70 60 74 70 68  Wt. (Lbs) 205 205 199 198.4 196 195.04 198  BMI 38.73 38.73 37.62 37.51 37.05 36.87 37.43   Foot/eye exam completion dates Latest Ref Rng & Units 04/12/2016 10/05/2014  Eye Exam No Retinopathy No Retinopathy No Retinopathy  Foot exam Order - - -  Foot Form Completion - - -

## 2016-06-03 NOTE — Assessment & Plan Note (Signed)
Deteriorated. Patient re-educated about  the importance of commitment to a  minimum of 150 minutes of exercise per week.  The importance of healthy food choices with portion control discussed. Encouraged to start a food diary, count calories and to consider  joining a support group. Sample diet sheets offered. Goals set by the patient for the next several months.   Weight /BMI 05/30/2016 05/02/2016 12/16/2015  WEIGHT 205 lb 205 lb 199 lb  HEIGHT 5\' 1"  5\' 1"  5\' 1"   BMI 38.73 kg/m2 38.73 kg/m2 37.62 kg/m2

## 2016-06-03 NOTE — Assessment & Plan Note (Signed)
Controlled, no change in medication  

## 2016-06-03 NOTE — Assessment & Plan Note (Signed)
deteriorated with hyperkalemia, will review potassium level post kayexelate x 1 , needs nephrology input. US renal also

## 2016-06-03 NOTE — Assessment & Plan Note (Signed)
Uncontrolled despite multiple meds, welcome nephrology input, no changes at this time DASH diet and commitment to daily physical activity for a minimum of 30 minutes discussed and encouraged, as a part of hypertension management. The importance of attaining a healthy weight is also discussed.  BP/Weight 05/30/2016 05/02/2016 12/16/2015 08/11/2015 06/29/2015 5/52/1747 07/06/9537  Systolic BP 672 897 915 041 364 383 779  Diastolic BP 80 78 70 60 74 70 68  Wt. (Lbs) 205 205 199 198.4 196 195.04 198  BMI 38.73 38.73 37.62 37.51 37.05 36.87 37.43

## 2016-06-05 ENCOUNTER — Ambulatory Visit (HOSPITAL_COMMUNITY)
Admission: RE | Admit: 2016-06-05 | Discharge: 2016-06-05 | Disposition: A | Discharge status: Home / Self Care | Payer: Medicare Other | Source: Ambulatory Visit | Attending: Family Medicine | Admitting: Family Medicine

## 2016-06-05 DIAGNOSIS — I10 Essential (primary) hypertension: Secondary | ICD-10-CM

## 2016-06-05 DIAGNOSIS — N183 Chronic kidney disease, stage 3 (moderate): Secondary | ICD-10-CM | POA: Diagnosis not present

## 2016-06-05 DIAGNOSIS — I129 Hypertensive chronic kidney disease with stage 1 through stage 4 chronic kidney disease, or unspecified chronic kidney disease: Secondary | ICD-10-CM | POA: Insufficient documentation

## 2016-06-07 ENCOUNTER — Other Ambulatory Visit: Payer: Self-pay

## 2016-06-07 MED ORDER — SODIUM POLYSTYRENE SULFONATE 15 GM/60ML PO SUSP: 30.0000 g | Freq: Once | ORAL | 0 refills | Status: AC

## 2016-06-18 ENCOUNTER — Other Ambulatory Visit: Payer: Self-pay | Admitting: Family Medicine

## 2016-06-27 ENCOUNTER — Ambulatory Visit (INDEPENDENT_AMBULATORY_CARE_PROVIDER_SITE_OTHER): Payer: Medicare Other | Admitting: Family Medicine

## 2016-06-27 ENCOUNTER — Encounter: Payer: Self-pay | Admitting: Family Medicine

## 2016-06-27 VITALS — BP 178/80 | HR 56 | Temp 99.2°F | Resp 16 | Ht 61.0 in | Wt 206.8 lb

## 2016-06-27 DIAGNOSIS — N390 Urinary tract infection, site not specified: Secondary | ICD-10-CM | POA: Diagnosis not present

## 2016-06-27 DIAGNOSIS — N3001 Acute cystitis with hematuria: Secondary | ICD-10-CM | POA: Diagnosis not present

## 2016-06-27 DIAGNOSIS — I10 Essential (primary) hypertension: Secondary | ICD-10-CM | POA: Diagnosis not present

## 2016-06-27 DIAGNOSIS — R319 Hematuria, unspecified: Secondary | ICD-10-CM | POA: Diagnosis not present

## 2016-06-27 DIAGNOSIS — Z23 Encounter for immunization: Secondary | ICD-10-CM | POA: Diagnosis not present

## 2016-06-27 LAB — POCT URINALYSIS DIPSTICK
BILIRUBIN UA: NEGATIVE
Glucose, UA: NEGATIVE
KETONES UA: NEGATIVE
Nitrite, UA: NEGATIVE
PH UA: 7
SPEC GRAV UA: 1.01
Urobilinogen, UA: 0.2

## 2016-06-27 MED ORDER — CIPROFLOXACIN HCL 250 MG PO TABS: 250.0000 mg | ORAL_TABLET | Freq: Two times a day (BID) | ORAL | 0 refills | Status: DC

## 2016-06-27 MED ORDER — CEFTRIAXONE SODIUM 500 MG IJ SOLR
250.0000 mg | Freq: Once | INTRAMUSCULAR | Status: AC
  Administered 2016-06-27: 250 mg via INTRAMUSCULAR

## 2016-06-27 NOTE — Patient Instructions (Signed)
F/u as before, call if you need me sooner  You are being treated for acte uTI  Rocephin 250 mg iM and 3 day course of ciprofloxacin is prescribed  Thank you  for choosing [facility]. We consider it a privelige to serve you.  Delivering excellent health care in a caring and  compassionate way is our goal.  Partnering with you,  so that together we can achieve this goal is our strategy.

## 2016-06-28 ENCOUNTER — Other Ambulatory Visit (HOSPITAL_COMMUNITY)
Admission: RE | Admit: 2016-06-28 | Discharge: 2016-06-28 | Disposition: A | Discharge status: Home / Self Care | Payer: Medicare Other | Source: Other Acute Inpatient Hospital | Attending: Emergency Medicine | Admitting: Emergency Medicine

## 2016-06-28 DIAGNOSIS — N3001 Acute cystitis with hematuria: Secondary | ICD-10-CM | POA: Diagnosis not present

## 2016-06-28 NOTE — Assessment & Plan Note (Signed)
Rocephin 250 mg IM in office and 3 day course of ciprofloxacin 250 mg twice daily Ensure 64 ounces water daily

## 2016-06-28 NOTE — Assessment & Plan Note (Signed)
Remains markedly elevated, upcoming appt with nephrology  in next 2 weeks No med changes Low sodium diet encouraged

## 2016-06-28 NOTE — Progress Notes (Signed)
   [name] [name]     MRN: [id number]      DOB: [birth year]   HPI [name] [name] is here with a 2 day h/o malaise, suprpubic pressure with dysuria and frequency. Denies fever or chills or flank pain. Was in her usual state of health prior to acute symptom onset 1 day ago  ROS Denies recent fever or chills. Denies sinus pressure, nasal congestion, ear pain or sore throat. Denies chest congestion, productive cough or wheezing. Denies chest pains, palpitations and leg swelling Denies   Denies uncontrolled  joint pain, swelling and limitation in mobility.  Denies depression, anxiety or insomnia. Denies skin break down or rash.   PE  BP (!) 178/80   Pulse (!) 56   Temp 99.2 F (37.3 C) (Oral)   Resp 16   Ht 5\' 1"  (1.549 m)   Wt 206 lb 12.8 oz (93.8 kg)   SpO2 96%   BMI 39.07 kg/m   Patient alert and oriented and in no cardiopulmonary distress.Mildly ill appearing  HEENT: No facial asymmetry, EOMI,   oropharynx pink and moist.  Neck supple no JVD, no mass.  Chest: Clear to auscultation bilaterally.  CVS: S1, S2 no murmurs, no S3.Regular rate.  ABD: Soft suprapubic tenderness, no renal angle tenderness, no guarding or rebound, normal BS   Ext: No edema  MS: Adequate though reduced  ROM spine, shoulders, hips and knees.  Skin: Intact, no ulcerations or rash noted.  Psych: Good eye contact, normal affect. Memory intact not anxious or depressed appearing.  CNS: CN 2-12 intact, power,  normal throughout.no focal deficits noted.   Assessment & Plan  UTI (urinary tract infection) Rocephin 250 mg IM in office and 3 day course of ciprofloxacin 250 mg twice daily Ensure 64 ounces water daily  Essential hypertension, malignant Remains markedly elevated, upcoming appt with nephrology  in next 2 weeks No med changes Low sodium diet encouraged

## 2016-07-01 LAB — URINE CULTURE: Culture: >=100000 — AB

## 2016-07-04 ENCOUNTER — Other Ambulatory Visit: Payer: Self-pay | Admitting: Family Medicine

## 2016-07-16 ENCOUNTER — Other Ambulatory Visit: Payer: Self-pay | Admitting: Family Medicine

## 2016-08-02 ENCOUNTER — Ambulatory Visit: Payer: Medicare Other

## 2016-08-02 DIAGNOSIS — I739 Peripheral vascular disease, unspecified: Principal | ICD-10-CM

## 2016-08-02 DIAGNOSIS — I779 Disorder of arteries and arterioles, unspecified: Secondary | ICD-10-CM

## 2016-08-02 DIAGNOSIS — I6523 Occlusion and stenosis of bilateral carotid arteries: Secondary | ICD-10-CM | POA: Diagnosis not present

## 2016-08-02 LAB — VAS US CAROTID
LCCADSYS: -89 cm/s
LCCAPDIAS: 18 cm/s
LCCAPSYS: 107 cm/s
LEFT ECA DIAS: 0 cm/s
LEFT VERTEBRAL DIAS: -10 cm/s
LICADDIAS: -14 cm/s
LICADSYS: -149 cm/s
LICAPSYS: -166 cm/s
Left CCA dist dias: -19 cm/s
Left ICA prox dias: -32 cm/s
RCCAPSYS: -119 cm/s
RIGHT ECA DIAS: 0 cm/s
RIGHT VERTEBRAL DIAS: -12 cm/s
Right CCA prox dias: 0 cm/s
Right cca dist sys: -126 cm/s

## 2016-08-07 ENCOUNTER — Telehealth: Payer: Self-pay

## 2016-08-07 DIAGNOSIS — I10 Essential (primary) hypertension: Secondary | ICD-10-CM

## 2016-08-07 DIAGNOSIS — I2510 Atherosclerotic heart disease of native coronary artery without angina pectoris: Secondary | ICD-10-CM

## 2016-08-07 DIAGNOSIS — E038 Other specified hypothyroidism: Secondary | ICD-10-CM

## 2016-08-07 DIAGNOSIS — R7301 Impaired fasting glucose: Secondary | ICD-10-CM

## 2016-08-07 DIAGNOSIS — E785 Hyperlipidemia, unspecified: Secondary | ICD-10-CM

## 2016-08-07 NOTE — Telephone Encounter (Signed)
CBC, fasting lipid, cmp and eGFR, hBA1C, tSH and vit D pls advise her to drink water so not dehydrated, when she goes for lab

## 2016-08-08 ENCOUNTER — Telehealth: Payer: Self-pay | Admitting: *Deleted

## 2016-08-08 DIAGNOSIS — R7301 Impaired fasting glucose: Secondary | ICD-10-CM | POA: Insufficient documentation

## 2016-08-08 NOTE — Telephone Encounter (Signed)
Patient informed and copy sent to PCP. 

## 2016-08-08 NOTE — Telephone Encounter (Signed)
-----   Message from Satira Sark, MD sent at 08/04/2016  7:46 PM EST ----- Results reviewed. 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 Will review further at follow-up. A copy of this test should be forwarded to Tula Nakayama, MD.

## 2016-08-08 NOTE — Telephone Encounter (Signed)
Orders placed patient aware to go and have lab prior to visit.

## 2016-08-08 NOTE — Telephone Encounter (Signed)
Labs ordered.

## 2016-08-10 ENCOUNTER — Ambulatory Visit: Payer: Medicare Other | Admitting: Cardiology

## 2016-08-16 DIAGNOSIS — E785 Hyperlipidemia, unspecified: Secondary | ICD-10-CM | POA: Diagnosis not present

## 2016-08-16 DIAGNOSIS — E559 Vitamin D deficiency, unspecified: Secondary | ICD-10-CM | POA: Diagnosis not present

## 2016-08-16 DIAGNOSIS — R7301 Impaired fasting glucose: Secondary | ICD-10-CM | POA: Diagnosis not present

## 2016-08-16 DIAGNOSIS — I10 Essential (primary) hypertension: Secondary | ICD-10-CM | POA: Diagnosis not present

## 2016-08-16 DIAGNOSIS — E038 Other specified hypothyroidism: Secondary | ICD-10-CM | POA: Diagnosis not present

## 2016-08-16 LAB — COMPLETE METABOLIC PANEL WITH GFR
ALT: 12 U/L (ref 6–29)
AST: 19 U/L (ref 10–35)
Albumin: 4.2 g/dL (ref 3.6–5.1)
Alkaline Phosphatase: 71 U/L (ref 33–130)
BILIRUBIN TOTAL: 0.3 mg/dL (ref 0.2–1.2)
BUN: 30 mg/dL — AB (ref 7–25)
CO2: 26 mmol/L (ref 20–31)
CREATININE: 1.53 mg/dL — AB (ref 0.60–0.93)
Calcium: 10.2 mg/dL (ref 8.6–10.4)
Chloride: 107 mmol/L (ref 98–110)
GFR, Est African American: 38 mL/min — ABNORMAL LOW (ref >=60)
GFR, Est Non African American: 33 mL/min — ABNORMAL LOW (ref >=60)
GLUCOSE: 94 mg/dL (ref 65–99)
Potassium: 5.3 mmol/L (ref 3.5–5.3)
SODIUM: 143 mmol/L (ref 135–146)
TOTAL PROTEIN: 7.6 g/dL (ref 6.1–8.1)

## 2016-08-16 LAB — LIPID PANEL
Cholesterol: 149 mg/dL (ref <200)
HDL: 45 mg/dL — ABNORMAL LOW (ref >50)
LDL CALC: 74 mg/dL (ref <100)
TRIGLYCERIDES: 148 mg/dL (ref <150)
Total CHOL/HDL Ratio: 3.3 Ratio (ref <5.0)
VLDL: 30 mg/dL (ref <30)

## 2016-08-16 LAB — CBC
HEMATOCRIT: 32 % — AB (ref 35.0–45.0)
Hemoglobin: 10.5 g/dL — ABNORMAL LOW (ref 11.7–15.5)
MCH: 29.7 pg (ref 27.0–33.0)
MCHC: 32.8 g/dL (ref 32.0–36.0)
MCV: 90.7 fL (ref 80.0–100.0)
MPV: 11.4 fL (ref 7.5–12.5)
Platelets: 215 10*3/uL (ref 140–400)
RBC: 3.53 MIL/uL — ABNORMAL LOW (ref 3.80–5.10)
RDW: 14 % (ref 11.0–15.0)
WBC: 6.8 10*3/uL (ref 3.8–10.8)

## 2016-08-16 LAB — TSH: TSH: 1.45 mIU/L

## 2016-08-17 LAB — HEMOGLOBIN A1C
Hgb A1c MFr Bld: 5.7 % — ABNORMAL HIGH (ref <5.7)
Mean Plasma Glucose: 117 mg/dL

## 2016-08-17 LAB — VITAMIN D 25 HYDROXY (VIT D DEFICIENCY, FRACTURES): Vit D, 25-Hydroxy: 55 ng/mL (ref 30–100)

## 2016-08-23 ENCOUNTER — Encounter: Payer: Self-pay | Admitting: Family Medicine

## 2016-08-23 ENCOUNTER — Other Ambulatory Visit: Payer: Self-pay

## 2016-08-23 ENCOUNTER — Ambulatory Visit (INDEPENDENT_AMBULATORY_CARE_PROVIDER_SITE_OTHER): Payer: Medicare Other | Admitting: Family Medicine

## 2016-08-23 VITALS — BP 160/74 | HR 50 | Resp 15 | Ht 61.0 in | Wt 208.0 lb

## 2016-08-23 DIAGNOSIS — E038 Other specified hypothyroidism: Secondary | ICD-10-CM | POA: Diagnosis not present

## 2016-08-23 DIAGNOSIS — I2510 Atherosclerotic heart disease of native coronary artery without angina pectoris: Secondary | ICD-10-CM

## 2016-08-23 DIAGNOSIS — I10 Essential (primary) hypertension: Secondary | ICD-10-CM

## 2016-08-23 DIAGNOSIS — R7302 Impaired glucose tolerance (oral): Secondary | ICD-10-CM | POA: Diagnosis not present

## 2016-08-23 DIAGNOSIS — E785 Hyperlipidemia, unspecified: Secondary | ICD-10-CM | POA: Diagnosis not present

## 2016-08-23 DIAGNOSIS — I779 Disorder of arteries and arterioles, unspecified: Secondary | ICD-10-CM | POA: Diagnosis not present

## 2016-08-23 DIAGNOSIS — I739 Peripheral vascular disease, unspecified: Secondary | ICD-10-CM

## 2016-08-23 MED ORDER — METOPROLOL SUCCINATE ER 100 MG PO TB24: ORAL_TABLET | ORAL | 1 refills | Status: DC

## 2016-08-23 MED ORDER — AMLODIPINE BESYLATE 5 MG PO TABS: 5.0000 mg | ORAL_TABLET | Freq: Every day | ORAL | 1 refills | Status: DC

## 2016-08-23 MED ORDER — SPIRONOLACTONE 25 MG PO TABS: 25.0000 mg | ORAL_TABLET | Freq: Every day | ORAL | 1 refills | Status: DC

## 2016-08-23 MED ORDER — OMEPRAZOLE 40 MG PO CPDR: 40.0000 mg | DELAYED_RELEASE_CAPSULE | Freq: Every day | ORAL | 1 refills | Status: DC

## 2016-08-23 NOTE — Progress Notes (Signed)
[name] [name]     MRN: [id number]      DOB: [birth year]   HPI [name] [name] is here for follow up and re-evaluation of chronic medical conditions, medication management and review of any available recent lab and radiology data.  Preventive health is updated, specifically  Cancer screening and Immunization.   Has cardiology appt  And nephrology appts  outstanding, both were rescheduled The PT denies any adverse reactions to current medications since the last visit.  There are no new concerns.  C/o bilateral hip pain which limits her mobility  ROS Denies recent fever or chills. Denies sinus pressure, nasal congestion, ear pain or sore throat. Denies chest congestion, productive cough or wheezing. Denies chest pains, palpitations and leg swelling Denies abdominal pain, nausea, vomiting,diarrhea or constipation.   Denies dysuria, frequency, hesitancy or incontinence. Chronic  joint pain,  and limitation in mobility. Denies headaches, seizures, numbness, or tingling. Denies depression, anxiety or insomnia. Denies skin break down or rash.   PE  BP (!) 160/74   Pulse (!) 50   Resp 15   Ht 5\' 1"  (1.549 m)   Wt 208 lb (94.3 kg)   SpO2 99%   BMI 39.30 kg/m   Patient alert and oriented and in no cardiopulmonary distress.  HEENT: No facial asymmetry, EOMI,   oropharynx pink and moist.  Neck supple no JVD, no mass.  Chest: Clear to auscultation bilaterally.  CVS: S1, S2 no murmurs, no S3.Regular rate.  ABD: Soft non tender.   Ext: No edema  MS: Decreased ROM spine, shoulders, hips and knees  Skin: Intact, no ulcerations or rash noted.  Psych: Good eye contact, normal affect. Memory intact not anxious or depressed appearing.  CNS: CN 2-12 intact, power,  normal throughout.no focal deficits noted.   Assessment & Plan  Essential hypertension, malignant Improved , but not at goal. No med change, will defer to cardiology and nephrology DASH diet and commitment to daily  physical activity for a minimum of 30 minutes discussed and encouraged, as a part of hypertension management. The importance of attaining a healthy weight is also discussed.  BP/Weight 08/23/2016 06/27/2016 05/30/2016 05/02/2016 12/16/2015 08/11/2015 99/24/2683  Systolic BP 419 622 297 989 211 941 740  Diastolic BP 74 80 80 78 70 60 74  Wt. (Lbs) 208 206.8 205 205 199 198.4 196  BMI 39.3 39.07 38.73 38.73 37.62 37.51 37.05  Weight loss encouraged     Carotid artery disease Followed by cardiology , had recent study , and will have OV in next 2 weeks, reports no change noted  CAD (coronary artery disease) Clinically stable, asymptomatic . Followed by cardiology. Encouraged increase in exercise, low hDL, lipids otherwise at goal  IGT (impaired glucose tolerance) Marked improvement , nearly normal hBA1C, pt applauded on this  Hyperlipidemia LDL goal <70 Hyperlipidemia:Low fat diet discussed and encouraged.   Lipid Panel  Lab Results  Component Value Date   CHOL 149 08/16/2016   HDL 45 (L) 08/16/2016   LDLCALC 74 08/16/2016   TRIG 148 08/16/2016   CHOLHDL 3.3 08/16/2016    needs to reduce fat slightly and increase exercise, no med chnage    Hypothyroidism Controlled, no change in medication   Morbid obesity Deteriorated. Patient re-educated about  the importance of commitment to a  minimum of 150 minutes of exercise per week.  The importance of healthy food choices with portion control discussed. Encouraged to start a food diary, count calories and to consider  joining  a support group. Sample diet sheets offered. Goals set by the patient for the next several months.   Weight /BMI 08/23/2016 06/27/2016 05/30/2016  WEIGHT 208 lb 206 lb 12.8 oz 205 lb  HEIGHT 5\' 1"  5\' 1"  5\' 1"   BMI 39.3 kg/m2 39.07 kg/m2 38.73 kg/m2

## 2016-08-23 NOTE — Assessment & Plan Note (Signed)
Controlled, no change in medication  

## 2016-08-23 NOTE — Assessment & Plan Note (Addendum)
Followed by cardiology , had recent study , and will have OV in next 2 weeks, reports no change noted

## 2016-08-23 NOTE — Patient Instructions (Addendum)
Wellness visit in 1 month, call if you need me sooner  Annual exam last week in August  Fasting lipid, cmp and eGFR and TSH in 5 month  Pls commit to daily exercise for 30 minutes at least  Congrats on excellent blood work   The cardiologist will work with your blood pressure also your nephrologist   Mammogram is due in June , please make sure thayt you get this done   Thanks for choosing [facility], we consider it a privelige to serve you.  

## 2016-08-23 NOTE — Assessment & Plan Note (Signed)
Clinically stable, asymptomatic . Followed by cardiology. Encouraged increase in exercise, low hDL, lipids otherwise at goal

## 2016-08-23 NOTE — Assessment & Plan Note (Signed)
Deteriorated. Patient re-educated about  the importance of commitment to a  minimum of 150 minutes of exercise per week.  The importance of healthy food choices with portion control discussed. Encouraged to start a food diary, count calories and to consider  joining a support group. Sample diet sheets offered. Goals set by the patient for the next several months.   Weight /BMI 08/23/2016 06/27/2016 05/30/2016  WEIGHT 208 lb 206 lb 12.8 oz 205 lb  HEIGHT 5\' 1"  5\' 1"  5\' 1"   BMI 39.3 kg/m2 39.07 kg/m2 38.73 kg/m2

## 2016-08-23 NOTE — Assessment & Plan Note (Signed)
Hyperlipidemia:Low fat diet discussed and encouraged.   Lipid Panel  Lab Results  Component Value Date   CHOL 149 08/16/2016   HDL 45 (L) 08/16/2016   LDLCALC 74 08/16/2016   TRIG 148 08/16/2016   CHOLHDL 3.3 08/16/2016    needs to reduce fat slightly and increase exercise, no med chnage

## 2016-08-23 NOTE — Assessment & Plan Note (Signed)
Improved , but not at goal. No med change, will defer to cardiology and nephrology DASH diet and commitment to daily physical activity for a minimum of 30 minutes discussed and encouraged, as a part of hypertension management. The importance of attaining a healthy weight is also discussed.  BP/Weight 08/23/2016 06/27/2016 05/30/2016 05/02/2016 12/16/2015 08/11/2015 62/08/5595  Systolic BP 416 384 536 468 032 122 482  Diastolic BP 74 80 80 78 70 60 74  Wt. (Lbs) 208 206.8 205 205 199 198.4 196  BMI 39.3 39.07 38.73 38.73 37.62 37.51 37.05  Weight loss encouraged

## 2016-08-23 NOTE — Assessment & Plan Note (Signed)
Marked improvement , nearly normal hBA1C, pt applauded on this

## 2016-09-12 NOTE — Progress Notes (Signed)
Cardiology Office Note  Date: 09/14/2016   ID: Veronika, Heard 1941-06-07, MRN [id number]  PCP: Tula Nakayama, MD  Primary Cardiologist: Rozann Lesches, MD   Chief Complaint  Patient presents with  . Coronary Artery Disease    History of Present Illness: CAMIAH HUMM is a 76 y.o. female last seen in February 2017. She is a former patient of Dr. Ron Parker. She presents for a routine follow-up visit. Does not report any angina symptoms. As I noted previously, she she has preferred to hold off on follow-up ischemic testing. She states that her only son passed away last summer. She seems to be doing with things reasonably well.  She follows with Dr. Moshe Cipro on a regular basis, had recent lab work in February which I reviewed below. We went over her medications. She is on a good antihypertensive regimen, however blood pressure has not been optimally controlled. Unfortunately, she was not able to tolerate higher dose Norvasc due to leg swelling. We discussed trying hydralazine. She also has pending evaluation with Dr. Lowanda Foster.  She had follow-up carotid Dopplers in February as outlined below.  I personally reviewed her ECG today which shows sinus bradycardia with anteroseptal Q waves, increased voltage and diffuse repolarization abnormalities.  Past Medical History:  Diagnosis Date  . 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 Carotid artery disease ([place])   . CKD (chronic kidney disease), stage III   . Essential hypertension   . Glaucoma    Laser surgery 2010  . Hyperlipidemia   . Hypothyroidism   . Obesity     Past Surgical History:  Procedure Laterality Date  . COLONOSCOPY N/A 02/04/2013   Procedure: COLONOSCOPY;  Surgeon: Rogene Houston, MD;  Location: [facility];  Service: Endoscopy;  Laterality: N/A;  1015  . CORONARY ARTERY BYPASS GRAFT  October 2005   Dr. Roxy Manns - LIMA to LAD, SVG to OM 2 and OM 3, SVG to PDA  . EYE SURGERY   2010   laser treatment to both eyes for glaucoma    Current Outpatient Prescriptions  Medication Sig Dispense Refill  . acetaminophen (TYLENOL) 500 MG tablet Take 1,000 mg by mouth every 6 (six) hours as needed for pain.    Marland Kitchen albuterol (PROAIR HFA) 108 (90 Base) MCG/ACT inhaler INHALE ONE PUFF EVERY FOUR HOURS AS NEEDED. 8.5 g 3  . amLODipine (NORVASC) 5 MG tablet Take 1 tablet (5 mg total) by mouth daily. 90 tablet 1  . aspirin 81 MG tablet Take 81 mg by mouth daily.    . cetirizine (ZYRTEC) 10 MG tablet Take 1 tablet (10 mg total) by mouth daily. 90 tablet 1  . Cholecalciferol (VITAMIN D3) 1000 UNITS CAPS Take 1 capsule by mouth daily.    . fish oil-omega-3 fatty acids 1000 MG capsule Take 2 g by mouth daily.    . fluticasone (FLONASE) 50 MCG/ACT nasal spray Place 2 sprays into both nostrils daily. (Patient taking differently: Place 2 sprays into both nostrils daily. As needed) 16 g 1  . latanoprost (XALATAN) 0.005 % ophthalmic solution Place 1 drop into both eyes at bedtime. One drop each eye at bedtime     . levothyroxine (SYNTHROID, LEVOTHROID) 75 MCG tablet TAKE ONE TABLET BY MOUTH DAILY 90 tablet 0  . metoprolol succinate (TOPROL-XL) 100 MG 24 hr tablet TAKE ONE TABLET BY MOUTH DAILY. TAKE WITH OR IMMEDIATELY FOLLOWING A MEAL. 90 tablet 1  . Multiple  Vitamins-Minerals (THERATRUM COMPLETE 50 PLUS PO) Take 1 tablet by mouth daily.     Marland Kitchen omeprazole (PRILOSEC) 40 MG capsule Take 1 capsule (40 mg total) by mouth daily. 90 capsule 1  . rosuvastatin (CRESTOR) 10 MG tablet TAKE ONE TABLET BY MOUTH DAILY 90 tablet 1  . spironolactone (ALDACTONE) 25 MG tablet Take 1 tablet (25 mg total) by mouth daily. 90 tablet 1  . valsartan-hydrochlorothiazide (DIOVAN-HCT) 320-25 MG tablet TAKE ONE TABLET BY MOUTH DAILY 90 tablet 1  . hydrALAZINE (APRESOLINE) 25 MG tablet Take 1 tablet (25 mg total) by mouth 2 (two) times daily. 60 tablet 11   No current facility-administered medications for this visit.     Allergies:  Clonidine derivatives   Social History: The patient  reports that she quit smoking about 13 years ago. Her smoking use included Cigarettes. She has a 12.00 pack-year smoking history. She has never used smokeless tobacco. She reports that she does not drink alcohol or use drugs.   ROS:  Please see the history of present illness. Otherwise, complete review of systems is positive for none.  All other systems are reviewed and negative.   Physical Exam: VS:  BP (!) 172/70   Pulse (!) 50   Ht 5\' 1"  (1.549 m)   Wt 209 lb (94.8 kg)   BMI 39.49 kg/m , BMI Body mass index is 39.49 kg/m.  Wt Readings from Last 3 Encounters:  09/14/16 209 lb (94.8 kg)  08/23/16 208 lb (94.3 kg)  06/27/16 206 lb 12.8 oz (93.8 kg)    General: Overweight woman, appears comfortable at rest. HEENT: Conjunctiva and lids normal, oropharynx clear. Neck: Supple, no elevated JVP or carotid bruits, no thyromegaly. Lungs: Clear to auscultation, nonlabored breathing at rest. Cardiac: Regular rate and rhythm, no S3, 2/6 basal systolic murmur, no pericardial rub. Abdomen: Soft, nontender, bowel sounds present, no guarding or rebound. Extremities: No pitting edema, distal pulses 2+. Skin: Warm and dry. Musculoskeletal: No kyphosis. Neuropsychiatric: Alert and oriented x3, affect grossly appropriate.  ECG: I personally reviewed the tracing from 12/07/2014 which showed sinus bradycardia with increased voltage, decreased R wave progression, and repolarization abnormalities.  Recent Labwork: 08/16/2016: ALT 12; AST 19; BUN 30; Creat 1.53; Hemoglobin 10.5; Platelets 215; Potassium 5.3; Sodium 143; TSH 1.45     Component Value Date/Time   CHOL 149 08/16/2016 0955   TRIG 148 08/16/2016 0955   HDL 45 (L) 08/16/2016 0955   CHOLHDL 3.3 08/16/2016 0955   VLDL 30 08/16/2016 0955   LDLCALC 74 08/16/2016 0955    Other Studies Reviewed Today:  Cardiac catheterization 04/11/2004 (pre-CABG):  Left main is  normal.  Left anterior descending artery has a tubular 80% stenosis in the mid vessel just after a large first diagonal branch. In the distal LAD there is a 70% stenosis. The first diagonal itself has diffuse 70% stenosis proximally.  Left circumflex is a very tortuous artery. There is a very long 80-90% stenosis in the mid circumflex which extends across the origin of a large second obtuse marginal branch. The circumflex gives rise to a small first obtuse marginal, large second obtuse marginal and a large third obtuse marginal branch. The second obtuse marginal has a 40% stenosis proximally.  Right coronary artery has a 90% stenosis in the proximal vessel followed by a tubular 60% stenosis in the mid vessel. In the distal right coronary artery there is a 60% stenosis which extends into the proximal portion of the posterior descending artery. The distal right  coronary artery gives rise to a normal size posterior descending artery and a small posterolateral branch.  IMPRESSION: 1. Normal left ventricular systolic function. 2. Severe three-vessel coronary artery disease.  Adenosine Cardiolite 01/21/2008 [facility]): Abnormal ST segment changes observed with adenosine, fixed lateral wall defect, TID ratio 1.23, LVEF 75%.  Carotid Dopplers 08/02/2016: Stable 40-59% bilateral ICA stenoses.  Assessment and Plan:  1. Symptomatically stable multivessel CAD status post CABG in 2005. She is comfortable with observation on medical therapy in the absence of angina, prefers to hold off on follow-up ischemic testing. We have discussed warning signs and symptoms. I reviewed her ECG which is relatively stable.  2. Essential hypertension, not optimally controlled. Continue current medications and try the addition of hydralazine 25 mg twice daily. This can be further up titrated by Dr. Moshe Cipro depending on blood pressure control.  3. Mixed hyperlipidemia, on Crestor with  recent LDL 74.  4. Moderate bilateral carotid artery disease, 40-59% ICA stenoses by recent Dopplers. Continue aspirin and statin.  Current medicines were reviewed with the patient today.   Orders Placed This Encounter  Procedures  . EKG 12-Lead    Disposition: Follow-up in one year, sooner if needed.   Signed, Satira Sark, MD, [facility] 09/14/2016 [facility] [place], [place], [place] [address] Phone: [phone]; Fax: [phone]

## 2016-09-13 ENCOUNTER — Encounter: Payer: Self-pay | Admitting: *Deleted

## 2016-09-14 ENCOUNTER — Encounter: Payer: Self-pay | Admitting: Cardiology

## 2016-09-14 ENCOUNTER — Ambulatory Visit (INDEPENDENT_AMBULATORY_CARE_PROVIDER_SITE_OTHER): Payer: Medicare Other | Admitting: Cardiology

## 2016-09-14 VITALS — BP 172/70 | HR 50 | Ht 61.0 in | Wt 209.0 lb

## 2016-09-14 DIAGNOSIS — I2510 Atherosclerotic heart disease of native coronary artery without angina pectoris: Secondary | ICD-10-CM | POA: Diagnosis not present

## 2016-09-14 DIAGNOSIS — E782 Mixed hyperlipidemia: Secondary | ICD-10-CM

## 2016-09-14 DIAGNOSIS — I779 Disorder of arteries and arterioles, unspecified: Secondary | ICD-10-CM | POA: Diagnosis not present

## 2016-09-14 DIAGNOSIS — I10 Essential (primary) hypertension: Secondary | ICD-10-CM | POA: Diagnosis not present

## 2016-09-14 DIAGNOSIS — I739 Peripheral vascular disease, unspecified: Secondary | ICD-10-CM

## 2016-09-14 MED ORDER — HYDRALAZINE HCL 25 MG PO TABS: 25.0000 mg | ORAL_TABLET | Freq: Two times a day (BID) | ORAL | 3 refills | Status: DC

## 2016-09-14 MED ORDER — HYDRALAZINE HCL 25 MG PO TABS: 25.0000 mg | ORAL_TABLET | Freq: Two times a day (BID) | ORAL | 11 refills | Status: DC

## 2016-09-14 NOTE — Patient Instructions (Signed)
Medication Instructions:   Begin Hydralazine 25mg  twice a day.  Continue all other medications.    Labwork: none  Testing/Procedures: none  Follow-Up: Your physician wants you to follow up in:  1 year.  You will receive a reminder letter in the mail one-two months in advance.  If you don't receive a letter, please call our office to schedule the follow up appointment   Any Other Special Instructions Will Be Listed Below (If Applicable).  If you need a refill on your cardiac medications before your next appointment, please call your pharmacy.

## 2016-09-14 NOTE — Addendum Note (Signed)
Addended by: Laurine Blazer on: 09/14/2016 11:07 AM   Modules accepted: Orders

## 2016-09-18 ENCOUNTER — Other Ambulatory Visit: Payer: Self-pay | Admitting: Family Medicine

## 2016-09-18 DIAGNOSIS — N184 Chronic kidney disease, stage 4 (severe): Secondary | ICD-10-CM | POA: Diagnosis not present

## 2016-09-18 DIAGNOSIS — D649 Anemia, unspecified: Secondary | ICD-10-CM | POA: Diagnosis not present

## 2016-09-18 DIAGNOSIS — I10 Essential (primary) hypertension: Secondary | ICD-10-CM | POA: Diagnosis not present

## 2016-09-18 DIAGNOSIS — R809 Proteinuria, unspecified: Secondary | ICD-10-CM | POA: Diagnosis not present

## 2016-09-20 ENCOUNTER — Ambulatory Visit (INDEPENDENT_AMBULATORY_CARE_PROVIDER_SITE_OTHER): Payer: Medicare Other

## 2016-09-20 VITALS — BP 140/62 | HR 63 | Temp 98.3°F | Ht 61.0 in | Wt 208.1 lb

## 2016-09-20 DIAGNOSIS — Z0000 Encounter for general adult medical examination without abnormal findings: Secondary | ICD-10-CM | POA: Diagnosis not present

## 2016-09-20 NOTE — Patient Instructions (Signed)
[name] [name] , Thank you for taking time to come for your Medicare Wellness Visit. I appreciate your ongoing commitment to your health goals. Please review the following plan we discussed and let me know if I can assist you in the future.   Screening recommendations/referrals: Colonoscopy: Up to date and is no longer required Mammogram: Up to date, will be due 11/2016 Bone Density: Up to date Recommended yearly ophthalmology/optometry visit for glaucoma screening and checkup Recommended yearly dental visit for hygiene and checkup  Vaccinations: Influenza vaccine: Up to date, will be due 04/2017 Pneumococcal vaccine: Up to date Tdap vaccine: Up to date, will be due 08/2017 Shingles vaccine: Up to date    Advanced directives: Advance directive discussed with you today. I have provided a copy for you to complete at home and have notarized. Once this is complete please bring a copy in to our office so we can scan it into your chart.  Conditions/risks identified: Obese, recommend starting a routine exercise program at least 3 days a week for 30-45 minutes at a time as tolerated.   Next appointment: Follow up with Dr. Moshe Cipro on 02/25/2017 at 9:40 am for your annual physical. Follow up in 1 year for your annual wellness visit.  Preventive Care 75 Years and Older, Female Preventive care refers to lifestyle choices and visits with your health care provider that can promote health and wellness. What does preventive care include?  A yearly physical exam. This is also called an annual well check.  Dental exams once or twice a year.  Routine eye exams. Ask your health care provider how often you should have your eyes checked.  Personal lifestyle choices, including:  Daily care of your teeth and gums.  Regular physical activity.  Eating a healthy diet.  Avoiding tobacco and drug use.  Limiting alcohol use.  Practicing safe sex.  Taking low-dose aspirin every day.  Taking vitamin and  mineral supplements as recommended by your health care provider. What happens during an annual well check? The services and screenings done by your health care provider during your annual well check will depend on your age, overall health, lifestyle risk factors, and family history of disease. Counseling  Your health care provider may ask you questions about your:  Alcohol use.  Tobacco use.  Drug use.  Emotional well-being.  Home and relationship well-being.  Sexual activity.  Eating habits.  History of falls.  Memory and ability to understand (cognition).  Work and work Statistician.  Reproductive health. Screening  You may have the following tests or measurements:  Height, weight, and BMI.  Blood pressure.  Lipid and cholesterol levels. These may be checked every 5 years, or more frequently if you are over 18 years old.  Skin check.  Lung cancer screening. You may have this screening every year starting at age 56 if you have a 30-pack-year history of smoking and currently smoke or have quit within the past 15 years.  Fecal occult blood test (FOBT) of the stool. You may have this test every year starting at age 23.  Flexible sigmoidoscopy or colonoscopy. You may have a sigmoidoscopy every 5 years or a colonoscopy every 10 years starting at age 37.  Hepatitis C blood test.  Hepatitis B blood test.  Sexually transmitted disease (STD) testing.  Diabetes screening. This is done by checking your blood sugar (glucose) after you have not eaten for a while (fasting). You may have this done every 1-3 years.  Bone density scan. This  is done to screen for osteoporosis. You may have this done starting at age 30.  Mammogram. This may be done every 1-2 years. Talk to your health care provider about how often you should have regular mammograms. Talk with your health care provider about your test results, treatment options, and if necessary, the need for more tests. Vaccines    Your health care provider may recommend certain vaccines, such as:  Influenza vaccine. This is recommended every year.  Tetanus, diphtheria, and acellular pertussis (Tdap, Td) vaccine. You may need a Td booster every 10 years.  Zoster vaccine. You may need this after age 5.  Pneumococcal 13-valent conjugate (PCV13) vaccine. One dose is recommended after age 7.  Pneumococcal polysaccharide (PPSV23) vaccine. One dose is recommended after age 64. Talk to your health care provider about which screenings and vaccines you need and how often you need them. This information is not intended to replace advice given to you by your health care provider. Make sure you discuss any questions you have with your health care provider. Document Released: 07/15/2015 Document Revised: 03/07/2016 Document Reviewed: 04/19/2015 Elsevier Interactive Patient Education  2017 [place] Prevention in the Home Falls can cause injuries. They can happen to people of all ages. There are many things you can do to make your home safe and to help prevent falls. What can I do on the outside of my home?  Regularly fix the edges of walkways and driveways and fix any cracks.  Remove anything that might make you trip as you walk through a door, such as a raised step or threshold.  Trim any bushes or trees on the path to your home.  Use bright outdoor lighting.  Clear any walking paths of anything that might make someone trip, such as rocks or tools.  Regularly check to see if handrails are loose or broken. Make sure that both sides of any steps have handrails.  Any raised decks and porches should have guardrails on the edges.  Have any leaves, snow, or ice cleared regularly.  Use sand or salt on walking paths during winter.  Clean up any spills in your garage right away. This includes oil or grease spills. What can I do in the bathroom?  Use night lights.  Install grab bars by the toilet and in the  tub and shower. Do not use towel bars as grab bars.  Use non-skid mats or decals in the tub or shower.  If you need to sit down in the shower, use a plastic, non-slip stool.  Keep the floor dry. Clean up any water that spills on the floor as soon as it happens.  Remove soap buildup in the tub or shower regularly.  Attach bath mats securely with double-sided non-slip rug tape.  Do not have throw rugs and other things on the floor that can make you trip. What can I do in the bedroom?  Use night lights.  Make sure that you have a light by your bed that is easy to reach.  Do not use any sheets or blankets that are too big for your bed. They should not hang down onto the floor.  Have a firm chair that has side arms. You can use this for support while you get dressed.  Do not have throw rugs and other things on the floor that can make you trip. What can I do in the kitchen?  Clean up any spills right away.  Avoid walking on wet floors.  Keep items that you use a lot in easy-to-reach places.  If you need to reach something above you, use a strong step stool that has a grab bar.  Keep electrical cords out of the way.  Do not use floor polish or wax that makes floors slippery. If you must use wax, use non-skid floor wax.  Do not have throw rugs and other things on the floor that can make you trip. What can I do with my stairs?  Do not leave any items on the stairs.  Make sure that there are handrails on both sides of the stairs and use them. Fix handrails that are broken or loose. Make sure that handrails are as long as the stairways.  Check any carpeting to make sure that it is firmly attached to the stairs. Fix any carpet that is loose or worn.  Avoid having throw rugs at the top or bottom of the stairs. If you do have throw rugs, attach them to the floor with carpet tape.  Make sure that you have a light switch at the top of the stairs and the bottom of the stairs. If you  do not have them, ask someone to add them for you. What else can I do to help prevent falls?  Wear shoes that:  Do not have high heels.  Have rubber bottoms.  Are comfortable and fit you well.  Are closed at the toe. Do not wear sandals.  If you use a stepladder:  Make sure that it is fully opened. Do not climb a closed stepladder.  Make sure that both sides of the stepladder are locked into place.  Ask someone to hold it for you, if possible.  Clearly mark and make sure that you can see:  Any grab bars or handrails.  First and last steps.  Where the edge of each step is.  Use tools that help you move around (mobility aids) if they are needed. These include:  Canes.  Walkers.  Scooters.  Crutches.  Turn on the lights when you go into a dark area. Replace any light bulbs as soon as they burn out.  Set up your furniture so you have a clear path. Avoid moving your furniture around.  If any of your floors are uneven, fix them.  If there are any pets around you, be aware of where they are.  Review your medicines with your doctor. Some medicines can make you feel dizzy. This can increase your chance of falling. Ask your doctor what other things that you can do to help prevent falls. This information is not intended to replace advice given to you by your health care provider. Make sure you discuss any questions you have with your health care provider. Document Released: 04/14/2009 Document Revised: 11/24/2015 Document Reviewed: 07/23/2014 Elsevier Interactive Patient Education  2017 [employer].

## 2016-09-20 NOTE — Progress Notes (Signed)
Subjective:   [name] [name] is a 76 y.o. female who presents for Medicare Annual (Subsequent) preventive examination.  Review of Systems:  Cardiac Risk Factors include: advanced age (>33men, >51 women);dyslipidemia;hypertension;obesity (BMI >30kg/m2);sedentary lifestyle     Objective:     Vitals: BP 140/62   Pulse 63   Temp 98.3 F (36.8 C) (Oral)   Ht 5\' 1"  (1.549 m)   Wt 208 lb 1.9 oz (94.4 kg)   SpO2 93%   BMI 39.32 kg/m   Body mass index is 39.32 kg/m.   Tobacco History  Smoking Status  . Former Smoker  . Packs/day: 0.25  . Years: 40.00  . Types: Cigarettes  . Quit date: 07/03/2003  Smokeless Tobacco  . Never Used     Counseling given: Not Answered   Past Medical History:  Diagnosis Date  . 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 Carotid artery disease ([place])   . CKD (chronic kidney disease), stage III   . Essential hypertension   . Glaucoma    Laser surgery 2010  . Hyperlipidemia   . Hypothyroidism   . Obesity    Past Surgical History:  Procedure Laterality Date  . COLONOSCOPY N/A 02/04/2013   Procedure: COLONOSCOPY;  Surgeon: Rogene Houston, MD;  Location: [facility];  Service: Endoscopy;  Laterality: N/A;  1015  . CORONARY ARTERY BYPASS GRAFT  October 2005   Dr. Roxy Manns - LIMA to LAD, SVG to OM 2 and OM 3, SVG to PDA  . EYE SURGERY  2010   laser treatment to both eyes for glaucoma   Family History  Problem Relation Age of Onset  . Aneurysm Mother   . Hypertension Mother   . Coronary artery disease Mother   . Coronary artery disease Father   . Diabetes Son   . Pancreatic cancer Son   . Colon cancer Brother 74    lived 18 months afterwards  . Liver cancer Brother 47  . Diabetes Sister   . Coronary artery disease Sister   . Diverticulosis Sister   . COPD Sister   . Kidney disease Sister   . Diabetes Sister   . Hypertension Sister   . Breast cancer Sister 35  . Hearing loss Sister   . Heart  Problems Sister     pacemaker   History  Sexual Activity  . Sexual activity: No    Outpatient Encounter Prescriptions as of 09/20/2016  Medication Sig  . acetaminophen (TYLENOL) 500 MG tablet Take 1,000 mg by mouth every 6 (six) hours as needed for pain.  Marland Kitchen albuterol (PROAIR HFA) 108 (90 Base) MCG/ACT inhaler INHALE ONE PUFF EVERY FOUR HOURS AS NEEDED.  Marland Kitchen amLODipine (NORVASC) 5 MG tablet Take 1 tablet (5 mg total) by mouth daily.  Marland Kitchen aspirin 81 MG tablet Take 81 mg by mouth daily.  . cetirizine (ZYRTEC) 10 MG tablet Take 1 tablet (10 mg total) by mouth daily.  . Cholecalciferol (VITAMIN D3) 1000 UNITS CAPS Take 1 capsule by mouth daily.  . fish oil-omega-3 fatty acids 1000 MG capsule Take 2 g by mouth daily.  . fluticasone (FLONASE) 50 MCG/ACT nasal spray Place 2 sprays into both nostrils daily. (Patient taking differently: Place 2 sprays into both nostrils daily. As needed)  . hydrALAZINE (APRESOLINE) 25 MG tablet Take 1 tablet (25 mg total) by mouth 2 (two) times daily.  Marland Kitchen latanoprost (XALATAN) 0.005 % ophthalmic solution Place 1 drop into both eyes  at bedtime. One drop each eye at bedtime   . levothyroxine (SYNTHROID, LEVOTHROID) 75 MCG tablet TAKE ONE TABLET BY MOUTH DAILY  . metoprolol succinate (TOPROL-XL) 100 MG 24 hr tablet TAKE ONE TABLET BY MOUTH DAILY. TAKE WITH OR IMMEDIATELY FOLLOWING A MEAL.  . Multiple Vitamins-Minerals (THERATRUM COMPLETE 50 PLUS PO) Take 1 tablet by mouth daily.   Marland Kitchen omeprazole (PRILOSEC) 40 MG capsule Take 1 capsule (40 mg total) by mouth daily.  . rosuvastatin (CRESTOR) 10 MG tablet TAKE ONE TABLET BY MOUTH DAILY  . spironolactone (ALDACTONE) 25 MG tablet Take 1 tablet (25 mg total) by mouth daily.  . valsartan-hydrochlorothiazide (DIOVAN-HCT) 320-25 MG tablet TAKE ONE TABLET BY MOUTH DAILY   No facility-administered encounter medications on file as of 09/20/2016.     Activities of Daily Living In your present state of health, do you have any difficulty  performing the following activities: 09/20/2016  Hearing? N  Vision? N  Difficulty concentrating or making decisions? N  Walking or climbing stairs? N  Dressing or bathing? N  Doing errands, shopping? N  Preparing Food and eating ? N  Using the Toilet? N  In the past six months, have you accidently leaked urine? N  Do you have problems with loss of bowel control? N  Managing your Medications? N  Managing your Finances? N  Housekeeping or managing your Housekeeping? N  Some recent data might be hidden    Patient Care Team: Fayrene Helper, MD as PCP - General Carlena Bjornstad, MD as Consulting Physician (Cardiology) Warden Fillers, MD as Consulting Physician (Optometry) Steffanie Rainwater, DPM as Consulting Physician (Podiatry)    Assessment:    Exercise Activities and Dietary recommendations Current Exercise Habits: The patient does not participate in regular exercise at present, Exercise limited by: None identified  Goals    . Exercise 3x per week (30 min per time)          Recommend chair exercises 3 times a week for 30-45 minutes at a time as tolerated.      Fall Risk Fall Risk  09/20/2016 08/23/2016 01/24/2015 05/17/2014 07/09/2013  Falls in the past year? No No No No No   Depression Screen PHQ 2/9 Scores 09/20/2016 08/23/2016 01/24/2015 09/13/2014  PHQ - 2 Score 1 0 0 0  PHQ- 9 Score - - - 1     Cognitive Function: Normal   6CIT Screen 09/20/2016  What Year? 0 points  What month? 0 points  What time? 0 points  Count back from 20 0 points  Months in reverse 0 points  Repeat phrase 0 points  Total Score 0    Immunization History  Administered Date(s) Administered  . Influenza Split 03/11/2012  . Influenza,inj,Quad PF,36+ Mos 04/02/2013, 05/17/2014, 06/29/2015, 06/27/2016  . Pneumococcal Conjugate-13 01/04/2014  . Pneumococcal Polysaccharide-23 09/25/2007  . Td 09/25/2007  . Zoster 03/12/2012   Screening Tests Health Maintenance  Topic Date Due  . TETANUS/TDAP   09/24/2017  . MAMMOGRAM  12/04/2017  . COLONOSCOPY  02/05/2023  . INFLUENZA VACCINE  Completed  . DEXA SCAN  Completed  . PNA vac Low Risk Adult  Completed      Plan:  I have personally reviewed and addressed the Medicare Annual Wellness questionnaire and have noted the following in the patient's chart:  A. Medical and social history B. Use of alcohol, tobacco or illicit drugs  C. Current medications and supplements D. Functional ability and status E.  Nutritional status F.  Physical activity G.  Advance directives H. List of other physicians I.  Hospitalizations, surgeries, and ER visits in previous 12 months J.  [place] to include cognitive, depression, and falls L. Referrals and appointments - none  In addition, I have reviewed and discussed with patient certain preventive protocols, quality metrics, and best practice recommendations. A written personalized care plan for preventive services as well as general preventive health recommendations were provided to patient.  Signed,   Stormy Fabian, LPN Lead Nurse Health Advisor

## 2016-09-24 ENCOUNTER — Telehealth: Payer: Self-pay | Admitting: Family Medicine

## 2016-09-24 ENCOUNTER — Telehealth: Payer: Self-pay | Admitting: Cardiology

## 2016-09-24 NOTE — Telephone Encounter (Signed)
[name] [name] is stating that she has mentioned the hydrALAZINE (APRESOLINE) 25 MG tablet to Dr. Domenic Polite that it is not agreeing with her its making her have Diarrhea, Nausea, Leg Weakness, Lft Hand tingling, Rt Foot Tingling and he insists on her continuing the medication and she doesn't feel like she should and wants Dr. Camillia Herter opinion, please advise?

## 2016-09-24 NOTE — Telephone Encounter (Signed)
Pt says tingling in feet and weakness in legs most of the time since starting Hydralazine - says she doesn't have a way of checking BP at home and wants [place] to send rx for home BP monitor - denies swelling/weight gain

## 2016-09-24 NOTE — Telephone Encounter (Signed)
Pt will continue hydralazine and f/u later this week

## 2016-09-24 NOTE — Telephone Encounter (Signed)
It may be that she notices fewer of these symptoms as she is on hydralazine for a bit longer. She does not need to have an ambulatory blood pressure monitor arranged. There are several blood pressure cuff options that she could get from a drug store or even [employer] at a reasonable price and also relatively accurate.

## 2016-09-24 NOTE — Telephone Encounter (Signed)
Recommend she try to work with Dr Domenic Polite on this some more, she  May need to call and schedule an appt to see him about it if a big enough prob, he is trying to protect her heart

## 2016-09-24 NOTE — Telephone Encounter (Signed)
New medication(Hydrolozine) has made her legs feel funny/tingling/prickly and she has had really soft stools and  often since starting.

## 2016-09-26 ENCOUNTER — Other Ambulatory Visit: Payer: Self-pay | Admitting: Family Medicine

## 2016-10-24 ENCOUNTER — Other Ambulatory Visit: Payer: Self-pay | Admitting: Family Medicine

## 2016-10-24 DIAGNOSIS — Z1231 Encounter for screening mammogram for malignant neoplasm of breast: Secondary | ICD-10-CM

## 2016-10-24 DIAGNOSIS — E559 Vitamin D deficiency, unspecified: Secondary | ICD-10-CM | POA: Diagnosis not present

## 2016-10-24 DIAGNOSIS — N183 Chronic kidney disease, stage 3 (moderate): Secondary | ICD-10-CM | POA: Diagnosis not present

## 2016-10-24 DIAGNOSIS — Z79899 Other long term (current) drug therapy: Secondary | ICD-10-CM | POA: Diagnosis not present

## 2016-10-24 DIAGNOSIS — R809 Proteinuria, unspecified: Secondary | ICD-10-CM | POA: Diagnosis not present

## 2016-10-24 DIAGNOSIS — Z780 Asymptomatic menopausal state: Secondary | ICD-10-CM

## 2016-10-24 DIAGNOSIS — I10 Essential (primary) hypertension: Secondary | ICD-10-CM | POA: Diagnosis not present

## 2016-10-24 DIAGNOSIS — D509 Iron deficiency anemia, unspecified: Secondary | ICD-10-CM | POA: Diagnosis not present

## 2016-10-31 DIAGNOSIS — N183 Chronic kidney disease, stage 3 (moderate): Secondary | ICD-10-CM | POA: Diagnosis not present

## 2016-10-31 DIAGNOSIS — R809 Proteinuria, unspecified: Secondary | ICD-10-CM | POA: Diagnosis not present

## 2016-10-31 DIAGNOSIS — D509 Iron deficiency anemia, unspecified: Secondary | ICD-10-CM | POA: Diagnosis not present

## 2016-10-31 DIAGNOSIS — I10 Essential (primary) hypertension: Secondary | ICD-10-CM | POA: Diagnosis not present

## 2016-11-07 ENCOUNTER — Other Ambulatory Visit: Payer: Self-pay | Admitting: Family Medicine

## 2016-11-09 ENCOUNTER — Other Ambulatory Visit: Payer: Self-pay | Admitting: Family Medicine

## 2016-11-13 ENCOUNTER — Encounter (INDEPENDENT_AMBULATORY_CARE_PROVIDER_SITE_OTHER): Payer: Self-pay | Admitting: *Deleted

## 2016-11-14 ENCOUNTER — Encounter (HOSPITAL_COMMUNITY)
Admission: RE | Admit: 2016-11-14 | Discharge: 2016-11-14 | Disposition: A | Discharge status: Home / Self Care | Payer: Medicare Other | Source: Ambulatory Visit | Attending: Nephrology | Admitting: Nephrology

## 2016-11-14 ENCOUNTER — Encounter (HOSPITAL_COMMUNITY): Payer: Self-pay

## 2016-11-14 DIAGNOSIS — D509 Iron deficiency anemia, unspecified: Secondary | ICD-10-CM | POA: Diagnosis not present

## 2016-11-14 MED ORDER — SODIUM CHLORIDE 0.9 % IV SOLN
INTRAVENOUS | Status: DC
  Administered 2016-11-14: 13:00:00 via INTRAVENOUS

## 2016-11-14 MED ORDER — SODIUM CHLORIDE 0.9 % IV SOLN
510.0000 mg | Freq: Once | INTRAVENOUS | Status: AC
  Administered 2016-11-14: 510 mg via INTRAVENOUS
  Filled 2016-11-14: qty 17

## 2016-11-21 ENCOUNTER — Encounter (HOSPITAL_COMMUNITY)
Admission: RE | Admit: 2016-11-21 | Discharge: 2016-11-21 | Disposition: A | Discharge status: Home / Self Care | Payer: Medicare Other | Source: Ambulatory Visit | Attending: Nephrology | Admitting: Nephrology

## 2016-11-21 ENCOUNTER — Encounter (HOSPITAL_COMMUNITY): Payer: Self-pay

## 2016-11-21 DIAGNOSIS — D509 Iron deficiency anemia, unspecified: Secondary | ICD-10-CM | POA: Diagnosis not present

## 2016-11-21 MED ORDER — SODIUM CHLORIDE 0.9 % IV SOLN
510.0000 mg | Freq: Once | INTRAVENOUS | Status: AC
Start: 2016-11-21 — End: 2016-11-21
  Administered 2016-11-21: 510 mg via INTRAVENOUS
  Filled 2016-11-21: qty 17

## 2016-11-21 MED ORDER — SODIUM CHLORIDE 0.9 % IV SOLN
INTRAVENOUS | Status: DC
  Administered 2016-11-21: 250 mL via INTRAVENOUS

## 2016-12-04 ENCOUNTER — Encounter (INDEPENDENT_AMBULATORY_CARE_PROVIDER_SITE_OTHER): Payer: Self-pay | Admitting: *Deleted

## 2016-12-04 ENCOUNTER — Encounter (INDEPENDENT_AMBULATORY_CARE_PROVIDER_SITE_OTHER): Payer: Self-pay | Admitting: Internal Medicine

## 2016-12-04 ENCOUNTER — Other Ambulatory Visit (INDEPENDENT_AMBULATORY_CARE_PROVIDER_SITE_OTHER): Payer: Self-pay | Admitting: Internal Medicine

## 2016-12-04 ENCOUNTER — Encounter: Payer: Self-pay | Admitting: Family Medicine

## 2016-12-04 ENCOUNTER — Ambulatory Visit (INDEPENDENT_AMBULATORY_CARE_PROVIDER_SITE_OTHER): Payer: Medicare Other | Admitting: Family Medicine

## 2016-12-04 ENCOUNTER — Ambulatory Visit (INDEPENDENT_AMBULATORY_CARE_PROVIDER_SITE_OTHER): Payer: Medicare Other | Admitting: Internal Medicine

## 2016-12-04 ENCOUNTER — Telehealth (INDEPENDENT_AMBULATORY_CARE_PROVIDER_SITE_OTHER): Payer: Self-pay | Admitting: *Deleted

## 2016-12-04 VITALS — BP 180/64 | HR 72 | Temp 97.6°F | Ht 61.0 in | Wt 200.5 lb

## 2016-12-04 VITALS — BP 164/78 | HR 76 | Temp 97.4°F | Resp 18 | Ht 61.0 in | Wt 201.0 lb

## 2016-12-04 DIAGNOSIS — E785 Hyperlipidemia, unspecified: Secondary | ICD-10-CM | POA: Diagnosis not present

## 2016-12-04 DIAGNOSIS — M10371 Gout due to renal impairment, right ankle and foot: Secondary | ICD-10-CM

## 2016-12-04 DIAGNOSIS — D508 Other iron deficiency anemias: Secondary | ICD-10-CM

## 2016-12-04 DIAGNOSIS — D649 Anemia, unspecified: Secondary | ICD-10-CM | POA: Insufficient documentation

## 2016-12-04 DIAGNOSIS — I10 Essential (primary) hypertension: Secondary | ICD-10-CM | POA: Diagnosis not present

## 2016-12-04 DIAGNOSIS — N3001 Acute cystitis with hematuria: Secondary | ICD-10-CM | POA: Diagnosis not present

## 2016-12-04 LAB — CBC WITH DIFFERENTIAL/PLATELET
BASOS PCT: 0 %
Basophils Absolute: 0 cells/uL (ref 0–200)
Eosinophils Absolute: 174 cells/uL (ref 15–500)
Eosinophils Relative: 2 %
HEMATOCRIT: 27.5 % — AB (ref 35.0–45.0)
HEMOGLOBIN: 8.8 g/dL — AB (ref 11.7–15.5)
LYMPHS ABS: 1914 {cells}/uL (ref 850–3900)
Lymphocytes Relative: 22 %
MCH: 29.1 pg (ref 27.0–33.0)
MCHC: 32 g/dL (ref 32.0–36.0)
MCV: 91.1 fL (ref 80.0–100.0)
MONO ABS: 957 {cells}/uL — AB (ref 200–950)
MPV: 11.4 fL (ref 7.5–12.5)
Monocytes Relative: 11 %
NEUTROS ABS: 5655 {cells}/uL (ref 1500–7800)
Neutrophils Relative %: 65 %
Platelets: 215 10*3/uL (ref 140–400)
RBC: 3.02 MIL/uL — AB (ref 3.80–5.10)
RDW: 16.9 % — AB (ref 11.0–15.0)
WBC: 8.7 10*3/uL (ref 3.8–10.8)

## 2016-12-04 MED ORDER — PREDNISONE 20 MG PO TABS: ORAL_TABLET | ORAL | 0 refills | Status: DC

## 2016-12-04 MED ORDER — HYDROCODONE-ACETAMINOPHEN 5-325 MG PO TABS: 1.0000 | ORAL_TABLET | ORAL | 0 refills | Status: DC | PRN

## 2016-12-04 MED ORDER — PEG 3350-KCL-NA BICARB-NACL 420 G PO SOLR: 4000.0000 mL | Freq: Once | ORAL | 0 refills | Status: AC

## 2016-12-04 NOTE — Patient Instructions (Signed)
Take one prednisone when you get the prescription filled Take another this evening Take one tab twice a day for 3 days After this take one a day for one week  I have prescribed a few pain pills if you need them Caution drowsiness and constipation  Call for problems

## 2016-12-04 NOTE — Patient Instructions (Addendum)
CBC and 3 stool cards home with patient.  The risks of bleeding, perforation and infection were reviewed with patient.

## 2016-12-04 NOTE — Progress Notes (Signed)
Chief Complaint  Patient presents with  . Foot Swelling    right since sunday   Normal day Saturday.  Normal activity.  Normal diet.  Woke up Sunday and could not bear weigh ton right ankle.  Swollen and very painful. She "stayed in bed" Sunday and Monday.   Took tylenol for pain.  Came in today for a specialist appt. And was told to go straight to her PCP.  Is a walk in. No fever.  No prior ankle psoblems PCP is Tula Nakayama, MD Chart reviewed  Patient Active Problem List   Diagnosis Date Noted  . Absolute anemia 12/04/2016  . GERD (gastroesophageal reflux disease) 11/10/2013  . Bradycardia, sinus 08/05/2013  . Metabolic syndrome X 89/38/1017  . CAD (coronary artery disease)   . Ejection fraction   . CKD (chronic kidney disease) 07/11/2011  . Hypothyroidism 01/31/2009  . CORONARY ARTERY BYPASS GRAFT, HX OF 01/31/2009  . Carotid artery disease ([place]) 11/30/2008  . IGT (impaired glucose tolerance) 11/28/2008  . Allergic rhinitis 08/12/2008  . Hyperlipidemia LDL goal <70 10/07/2007  . Morbid obesity ([place]) 10/07/2007  . Essential hypertension, malignant 10/07/2007    Outpatient Encounter Prescriptions as of 12/04/2016  Medication Sig  . acetaminophen (TYLENOL) 500 MG tablet Take 1,000 mg by mouth every 6 (six) hours as needed for pain.  Marland Kitchen albuterol (PROAIR HFA) 108 (90 Base) MCG/ACT inhaler INHALE ONE PUFF EVERY FOUR HOURS AS NEEDED.  Marland Kitchen amLODipine (NORVASC) 5 MG tablet Take 1 tablet (5 mg total) by mouth daily.  Marland Kitchen aspirin 81 MG tablet Take 81 mg by mouth daily.  . cetirizine (ZYRTEC) 10 MG tablet TAKE ONE TABLET BY MOUTH DAILY.  Marland Kitchen Cholecalciferol (VITAMIN D3) 1000 UNITS CAPS Take 1 capsule by mouth daily.  . fish oil-omega-3 fatty acids 1000 MG capsule Take 2 g by mouth daily.  . fluticasone (FLONASE) 50 MCG/ACT nasal spray USE 2 SPRAYS IN EACH NOSTRIL ONCE DAILY.  SHAKE GENTLY BEFORE USING.  . hydrALAZINE (APRESOLINE) 25 MG tablet Take 1 tablet (25 mg total) by mouth 2  (two) times daily.  Marland Kitchen latanoprost (XALATAN) 0.005 % ophthalmic solution Place 1 drop into both eyes at bedtime. One drop each eye at bedtime   . levothyroxine (SYNTHROID, LEVOTHROID) 75 MCG tablet TAKE ONE TABLET BY MOUTH DAILY  . metoprolol succinate (TOPROL-XL) 100 MG 24 hr tablet TAKE ONE TABLET BY MOUTH DAILY. TAKE WITH OR IMMEDIATELY FOLLOWING A MEAL.  . Multiple Vitamins-Minerals (THERATRUM COMPLETE 50 PLUS PO) Take 1 tablet by mouth daily.   Marland Kitchen omeprazole (PRILOSEC) 40 MG capsule Take 1 capsule (40 mg total) by mouth daily.  . rosuvastatin (CRESTOR) 10 MG tablet TAKE ONE TABLET BY MOUTH DAILY  . spironolactone (ALDACTONE) 25 MG tablet Take 1 tablet (25 mg total) by mouth daily.  . valsartan-hydrochlorothiazide (DIOVAN-HCT) 320-25 MG tablet TAKE ONE TABLET BY MOUTH DAILY  . HYDROcodone-acetaminophen (NORCO/VICODIN) 5-325 MG tablet Take 1 tablet by mouth every 4 (four) hours as needed for moderate pain.  . predniSONE (DELTASONE) 20 MG tablet Take one tablet two times a day with food for 3 days then one a day for a week   No facility-administered encounter medications on file as of 12/04/2016.     Allergies  Allergen Reactions  . Clonidine Derivatives Other (See Comments)    Zombie BP dropped    Review of Systems  Constitutional: Negative for activity change, appetite change and fever.  Respiratory: Negative for cough and shortness of breath.   Cardiovascular:  Positive for leg swelling. Negative for chest pain and palpitations.  Gastrointestinal: Negative for constipation, diarrhea and nausea.  Musculoskeletal: Positive for arthralgias, gait problem and joint swelling.  Skin: Positive for color change.  Psychiatric/Behavioral: Positive for sleep disturbance.  All other systems reviewed and are negative.   BP (!) 164/78 (BP Location: Left Arm, Patient Position: Sitting, Cuff Size: Normal)   Pulse 76   Temp 97.4 F (36.3 C) (Temporal)   Resp 18   Ht 5\' 1"  (1.549 m)   Wt 201 lb  0.2 oz (91.2 kg)   SpO2 93%   BMI 37.98 kg/m   Physical Exam  Constitutional: She is oriented to person, place, and time. She appears well-developed and well-nourished. No distress.  Very antalgic gait  HENT:  Head: Normocephalic and atraumatic.  Mouth/Throat: Oropharynx is clear and moist.  Cardiovascular: Normal rate, regular rhythm and normal heart sounds.   Pulmonary/Chest: Effort normal and breath sounds normal. No respiratory distress.  Musculoskeletal:  Right ankle swollen.  Skin faintly red.  Ankle movement very painful. Ankle joint very tender.  Remainder of foot and ankle normal.  Neurological: She is alert and oriented to person, place, and time.  Psychiatric: She has a normal mood and affect. Her behavior is normal.    ASSESSMENT/PLAN:  1. 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

## 2016-12-04 NOTE — Telephone Encounter (Signed)
Patient needs trilyte 

## 2016-12-04 NOTE — Progress Notes (Signed)
Subjective:    Patient ID: [name] [name], female    DOB: Nov 17, 1940, 76 y.o.   MRN: [id number]  HPI  Referred by Dr. Hinda Lenis for anemia.  Has seen Dr. Maryruth Eve x 2. From Epic apears hx of same (anemia).  No iron studies in Epic. Normal CBC in 2012. She has had 2 iron transfusions. Iron transfusion May 16 an second was May 23.  Hx of CKD Stage 3. She denies seeing any blood in her stools. Her BMs are brown or green. No change in her stools.  Appetite is good. No weight loss.   Patient has hx of colon adenomas and family hx of colon cancer in brother age 52 (deceased).  Last colonoscopy in 2014.    10/24/2016 Iron Sat 12. TIBC 384, Iron 46, Vitamin B12 874, Folate greater than 20.  Hemoglobin 8.3.  Ferritin 18.   Last colonoscopy in 2014 for family hx of colon cancer and hx of colonic adenomas.  Impression:  Examination performed to cecum. Two small polyps removed from proximal transverse colon and submitted together as above. Two small polyps ablated via cold biopsy from distal transverse colon and submitted together. 10 mm pedunculated polyp snared from descending colon. Two small rectal polyps or ablated using snare tip. Mild sigmoid colon diverticulosis. Small external hemorrhoids.    CBC Latest Ref Rng & Units 08/16/2016 12/07/2014 12/31/2013  WBC 3.8 - 10.8 K/uL 6.8 6.4 7.4  Hemoglobin 11.7 - 15.5 g/dL 10.5(L) 10.5(L) 11.7(L)  Hematocrit 35.0 - 45.0 % 32.0(L) 31.8(L) 34.2(L)  Platelets 140 - 400 K/uL 215 182 183    Review of Systems Past Medical History:  Diagnosis Date  . 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 Carotid artery disease ([place])   . CKD (chronic kidney disease), stage III   . Essential hypertension   . Glaucoma    Laser surgery 2010  . Hyperlipidemia   . Hypothyroidism   . Obesity     Past Surgical History:  Procedure Laterality Date  . COLONOSCOPY N/A 02/04/2013   Procedure: COLONOSCOPY;  Surgeon: Rogene Houston, MD;  Location: [facility];  Service: Endoscopy;  Laterality: N/A;  1015  . CORONARY ARTERY BYPASS GRAFT  October 2005   Dr. Roxy Manns - LIMA to LAD, SVG to OM 2 and OM 3, SVG to PDA  . EYE SURGERY  2010   laser treatment to both eyes for glaucoma    Allergies  Allergen Reactions  . Clonidine Derivatives Other (See Comments)    Zombie BP dropped    Current Outpatient Prescriptions on File Prior to Visit  Medication Sig Dispense Refill  . acetaminophen (TYLENOL) 500 MG tablet Take 1,000 mg by mouth every 6 (six) hours as needed for pain.    Marland Kitchen albuterol (PROAIR HFA) 108 (90 Base) MCG/ACT inhaler INHALE ONE PUFF EVERY FOUR HOURS AS NEEDED. 8.5 g 3  . amLODipine (NORVASC) 5 MG tablet Take 1 tablet (5 mg total) by mouth daily. 90 tablet 1  . aspirin 81 MG tablet Take 81 mg by mouth daily.    . cetirizine (ZYRTEC) 10 MG tablet TAKE ONE TABLET BY MOUTH DAILY. 90 tablet 1  . Cholecalciferol (VITAMIN D3) 1000 UNITS CAPS Take 1 capsule by mouth daily.    . fish oil-omega-3 fatty acids 1000 MG capsule Take 2 g by mouth daily.    . fluticasone (FLONASE) 50 MCG/ACT nasal spray USE 2 SPRAYS IN EACH NOSTRIL ONCE DAILY.  SHAKE GENTLY BEFORE USING. 48 g 1  . hydrALAZINE (APRESOLINE) 25 MG tablet Take 1 tablet (25 mg total) by mouth 2 (two) times daily. 180 tablet 3  . latanoprost (XALATAN) 0.005 % ophthalmic solution Place 1 drop into both eyes at bedtime. One drop each eye at bedtime     . levothyroxine (SYNTHROID, LEVOTHROID) 75 MCG tablet TAKE ONE TABLET BY MOUTH DAILY 90 tablet 1  . metoprolol succinate (TOPROL-XL) 100 MG 24 hr tablet TAKE ONE TABLET BY MOUTH DAILY. TAKE WITH OR IMMEDIATELY FOLLOWING A MEAL. 90 tablet 1  . Multiple Vitamins-Minerals (THERATRUM COMPLETE 50 PLUS PO) Take 1 tablet by mouth daily.     Marland Kitchen omeprazole (PRILOSEC) 40 MG capsule Take 1 capsule (40 mg total) by mouth daily. 90 capsule 1  . rosuvastatin (CRESTOR) 10 MG tablet TAKE ONE TABLET BY MOUTH DAILY 90 tablet 1  .  spironolactone (ALDACTONE) 25 MG tablet Take 1 tablet (25 mg total) by mouth daily. 90 tablet 1  . valsartan-hydrochlorothiazide (DIOVAN-HCT) 320-25 MG tablet TAKE ONE TABLET BY MOUTH DAILY 90 tablet 1   No current facility-administered medications on file prior to visit.    '     Objective:   Physical Exam Blood pressure (!) 180/64, pulse 72, temperature 97.6 F (36.4 C), height 5\' 1"  (1.549 m), weight 200 lb 8 oz (90.9 kg). Alert and oriented. Skin warm and dry. Oral mucosa is moist.   . Sclera anicteric, conjunctivae is pink. Thyroid not enlarged. No cervical lymphadenopathy. Lungs clear. Heart regular rate and rhythm. Murmur heard  Abdomen is soft. Bowel sounds are positive. No hepatomegaly. No abdominal masses felt. No tenderness.  No edema to lower extremities. Stool grown and guaiac negative.        Assessment & Plan:  IDA. Stool cards x 3 sent home with patient. CBC today.  Colonic neoplasm needs to be ruled out.  If normal PUD needs to be ruled out. EGD/Colonoscopy.

## 2016-12-05 ENCOUNTER — Other Ambulatory Visit: Payer: Self-pay | Admitting: Family Medicine

## 2016-12-05 ENCOUNTER — Encounter (INDEPENDENT_AMBULATORY_CARE_PROVIDER_SITE_OTHER): Payer: Self-pay

## 2016-12-05 LAB — COMPLETE METABOLIC PANEL WITH GFR
ALT: 11 U/L (ref 6–29)
AST: 15 U/L (ref 10–35)
Albumin: 4.1 g/dL (ref 3.6–5.1)
Alkaline Phosphatase: 71 U/L (ref 33–130)
BILIRUBIN TOTAL: 0.4 mg/dL (ref 0.2–1.2)
BUN: 27 mg/dL — ABNORMAL HIGH (ref 7–25)
CO2: 21 mmol/L (ref 20–31)
Calcium: 10 mg/dL (ref 8.6–10.4)
Chloride: 106 mmol/L (ref 98–110)
Creat: 1.86 mg/dL — ABNORMAL HIGH (ref 0.60–0.93)
GFR, EST NON AFRICAN AMERICAN: 26 mL/min — AB (ref >=60)
GFR, Est African American: 30 mL/min — ABNORMAL LOW (ref >=60)
Glucose, Bld: 95 mg/dL (ref 65–99)
Potassium: 4.8 mmol/L (ref 3.5–5.3)
SODIUM: 140 mmol/L (ref 135–146)
TOTAL PROTEIN: 7.8 g/dL (ref 6.1–8.1)

## 2016-12-05 LAB — LIPID PANEL
Cholesterol: 106 mg/dL (ref <200)
HDL: 49 mg/dL — ABNORMAL LOW (ref >50)
LDL CALC: 38 mg/dL (ref <100)
Total CHOL/HDL Ratio: 2.2 Ratio (ref <5.0)
Triglycerides: 97 mg/dL (ref <150)
VLDL: 19 mg/dL (ref <30)

## 2016-12-05 LAB — URIC ACID: URIC ACID, SERUM: 10 mg/dL — AB (ref 2.5–7.0)

## 2016-12-05 LAB — URINE CULTURE: Organism ID, Bacteria: NO GROWTH

## 2016-12-05 MED ORDER — COLCHICINE 0.6 MG PO TABS: ORAL_TABLET | ORAL | 4 refills | Status: DC

## 2016-12-07 ENCOUNTER — Encounter (INDEPENDENT_AMBULATORY_CARE_PROVIDER_SITE_OTHER): Payer: Self-pay | Admitting: *Deleted

## 2016-12-07 ENCOUNTER — Other Ambulatory Visit: Payer: Self-pay

## 2016-12-07 DIAGNOSIS — Z780 Asymptomatic menopausal state: Secondary | ICD-10-CM

## 2016-12-07 NOTE — Progress Notes (Signed)
Bone density

## 2016-12-10 ENCOUNTER — Ambulatory Visit (HOSPITAL_COMMUNITY)
Admission: RE | Admit: 2016-12-10 | Discharge: 2016-12-10 | Disposition: A | Discharge status: Home / Self Care | Payer: Medicare Other | Source: Ambulatory Visit | Attending: Family Medicine | Admitting: Family Medicine

## 2016-12-10 ENCOUNTER — Ambulatory Visit (HOSPITAL_COMMUNITY): Payer: Medicare Other

## 2016-12-10 ENCOUNTER — Other Ambulatory Visit (HOSPITAL_COMMUNITY): Payer: Medicare Other

## 2016-12-10 ENCOUNTER — Telehealth (INDEPENDENT_AMBULATORY_CARE_PROVIDER_SITE_OTHER): Payer: Self-pay | Admitting: *Deleted

## 2016-12-10 DIAGNOSIS — Z780 Asymptomatic menopausal state: Secondary | ICD-10-CM | POA: Insufficient documentation

## 2016-12-10 DIAGNOSIS — Z79899 Other long term (current) drug therapy: Secondary | ICD-10-CM | POA: Diagnosis not present

## 2016-12-10 DIAGNOSIS — Z1231 Encounter for screening mammogram for malignant neoplasm of breast: Secondary | ICD-10-CM | POA: Diagnosis not present

## 2016-12-10 DIAGNOSIS — Z13820 Encounter for screening for osteoporosis: Secondary | ICD-10-CM | POA: Insufficient documentation

## 2016-12-10 NOTE — Telephone Encounter (Signed)
   Diagnosis:    Result(s)   Card 1: Positive 12/05/2016   Card 2: Positive: 12/06/2016    Card 3: Positive: 12/07/2016     Completed by: Thomas Hoff, LPN   HEMOCCULT SENSA DEVELOPER: LOT#:  [id number] S EXPIRATION DATE: 2020-05   HEMOCCULT SENSA CARD:  LOT#:  [id number] R EXPIRATION DATE: 05/20   CARD CONTROL RESULTS:  POSITIVE: Positive NEGATIVE: Negative    ADDITIONAL COMMENTS: Results sent to [facility] , patient has not been called with results.

## 2016-12-10 NOTE — Telephone Encounter (Signed)
No answer at home #

## 2016-12-11 NOTE — Telephone Encounter (Signed)
Results have been given to patient She is scheduled for an EGD/Colonoscopy

## 2016-12-12 ENCOUNTER — Encounter (INDEPENDENT_AMBULATORY_CARE_PROVIDER_SITE_OTHER): Payer: Self-pay | Admitting: *Deleted

## 2016-12-12 ENCOUNTER — Other Ambulatory Visit (INDEPENDENT_AMBULATORY_CARE_PROVIDER_SITE_OTHER): Payer: Self-pay | Admitting: *Deleted

## 2016-12-12 DIAGNOSIS — D508 Other iron deficiency anemias: Secondary | ICD-10-CM

## 2016-12-17 DIAGNOSIS — H10413 Chronic giant papillary conjunctivitis, bilateral: Secondary | ICD-10-CM | POA: Diagnosis not present

## 2016-12-17 DIAGNOSIS — H04123 Dry eye syndrome of bilateral lacrimal glands: Secondary | ICD-10-CM | POA: Diagnosis not present

## 2016-12-21 ENCOUNTER — Encounter (HOSPITAL_COMMUNITY): Payer: Self-pay

## 2016-12-21 ENCOUNTER — Encounter (HOSPITAL_COMMUNITY)
Admission: RE | Disposition: A | Discharge status: Home / Self Care | Payer: Self-pay | Source: Ambulatory Visit | Attending: Internal Medicine

## 2016-12-21 ENCOUNTER — Ambulatory Visit (HOSPITAL_COMMUNITY)
Admission: RE | Admit: 2016-12-21 | Discharge: 2016-12-21 | Disposition: A | Discharge status: Home / Self Care | Payer: Medicare Other | Source: Ambulatory Visit | Attending: Internal Medicine | Admitting: Internal Medicine

## 2016-12-21 DIAGNOSIS — K922 Gastrointestinal hemorrhage, unspecified: Secondary | ICD-10-CM | POA: Diagnosis not present

## 2016-12-21 DIAGNOSIS — E669 Obesity, unspecified: Secondary | ICD-10-CM | POA: Insufficient documentation

## 2016-12-21 DIAGNOSIS — D508 Other iron deficiency anemias: Secondary | ICD-10-CM

## 2016-12-21 DIAGNOSIS — D125 Benign neoplasm of sigmoid colon: Secondary | ICD-10-CM | POA: Insufficient documentation

## 2016-12-21 DIAGNOSIS — Z87891 Personal history of nicotine dependence: Secondary | ICD-10-CM | POA: Diagnosis not present

## 2016-12-21 DIAGNOSIS — K2970 Gastritis, unspecified, without bleeding: Secondary | ICD-10-CM | POA: Insufficient documentation

## 2016-12-21 DIAGNOSIS — Z951 Presence of aortocoronary bypass graft: Secondary | ICD-10-CM | POA: Insufficient documentation

## 2016-12-21 DIAGNOSIS — N183 Chronic kidney disease, stage 3 (moderate): Secondary | ICD-10-CM | POA: Diagnosis not present

## 2016-12-21 DIAGNOSIS — I129 Hypertensive chronic kidney disease with stage 1 through stage 4 chronic kidney disease, or unspecified chronic kidney disease: Secondary | ICD-10-CM | POA: Insufficient documentation

## 2016-12-21 DIAGNOSIS — D500 Iron deficiency anemia secondary to blood loss (chronic): Secondary | ICD-10-CM | POA: Insufficient documentation

## 2016-12-21 DIAGNOSIS — K31819 Angiodysplasia of stomach and duodenum without bleeding: Secondary | ICD-10-CM | POA: Diagnosis not present

## 2016-12-21 DIAGNOSIS — I2510 Atherosclerotic heart disease of native coronary artery without angina pectoris: Secondary | ICD-10-CM | POA: Insufficient documentation

## 2016-12-21 DIAGNOSIS — Z7982 Long term (current) use of aspirin: Secondary | ICD-10-CM | POA: Diagnosis not present

## 2016-12-21 DIAGNOSIS — Z7952 Long term (current) use of systemic steroids: Secondary | ICD-10-CM | POA: Diagnosis not present

## 2016-12-21 DIAGNOSIS — K3189 Other diseases of stomach and duodenum: Secondary | ICD-10-CM | POA: Diagnosis not present

## 2016-12-21 DIAGNOSIS — Z79899 Other long term (current) drug therapy: Secondary | ICD-10-CM | POA: Insufficient documentation

## 2016-12-21 DIAGNOSIS — E039 Hypothyroidism, unspecified: Secondary | ICD-10-CM | POA: Diagnosis not present

## 2016-12-21 DIAGNOSIS — K449 Diaphragmatic hernia without obstruction or gangrene: Secondary | ICD-10-CM | POA: Diagnosis not present

## 2016-12-21 DIAGNOSIS — E785 Hyperlipidemia, unspecified: Secondary | ICD-10-CM | POA: Diagnosis not present

## 2016-12-21 DIAGNOSIS — D123 Benign neoplasm of transverse colon: Secondary | ICD-10-CM | POA: Insufficient documentation

## 2016-12-21 DIAGNOSIS — H409 Unspecified glaucoma: Secondary | ICD-10-CM | POA: Insufficient documentation

## 2016-12-21 DIAGNOSIS — D649 Anemia, unspecified: Secondary | ICD-10-CM | POA: Insufficient documentation

## 2016-12-21 DIAGNOSIS — D509 Iron deficiency anemia, unspecified: Secondary | ICD-10-CM

## 2016-12-21 DIAGNOSIS — K31811 Angiodysplasia of stomach and duodenum with bleeding: Secondary | ICD-10-CM | POA: Diagnosis not present

## 2016-12-21 DIAGNOSIS — K5730 Diverticulosis of large intestine without perforation or abscess without bleeding: Secondary | ICD-10-CM | POA: Diagnosis not present

## 2016-12-21 HISTORY — PX: ESOPHAGOGASTRODUODENOSCOPY: SHX5428

## 2016-12-21 HISTORY — PX: COLONOSCOPY: SHX5424

## 2016-12-21 LAB — HEMOGLOBIN AND HEMATOCRIT, BLOOD
HEMATOCRIT: 29.7 % — AB (ref 36.0–46.0)
HEMOGLOBIN: 9.9 g/dL — AB (ref 12.0–15.0)

## 2016-12-21 SURGERY — EGD (ESOPHAGOGASTRODUODENOSCOPY)
Anesthesia: Moderate Sedation

## 2016-12-21 MED ORDER — STERILE WATER FOR IRRIGATION IR SOLN
Status: DC | PRN
  Administered 2016-12-21: 13:00:00

## 2016-12-21 MED ORDER — LIDOCAINE VISCOUS 2 % MT SOLN
OROMUCOSAL | Status: DC | PRN
  Administered 2016-12-21: 5 mL via OROMUCOSAL

## 2016-12-21 MED ORDER — MIDAZOLAM HCL 5 MG/5ML IJ SOLN
INTRAMUSCULAR | Status: DC | PRN
  Administered 2016-12-21: 1 mg via INTRAVENOUS
  Administered 2016-12-21: 2 mg via INTRAVENOUS
  Administered 2016-12-21: 1 mg via INTRAVENOUS
  Administered 2016-12-21: 2 mg via INTRAVENOUS

## 2016-12-21 MED ORDER — SODIUM CHLORIDE 0.9 % IV SOLN
INTRAVENOUS | Status: DC
  Administered 2016-12-21: 12:00:00 via INTRAVENOUS

## 2016-12-21 MED ORDER — FLINTSTONES PLUS IRON PO CHEW: 1.0000 | CHEWABLE_TABLET | Freq: Two times a day (BID) | ORAL | Status: DC

## 2016-12-21 MED ORDER — MEPERIDINE HCL 50 MG/ML IJ SOLN
INTRAMUSCULAR | Status: DC | PRN
  Administered 2016-12-21 (×2): 25 mg via INTRAVENOUS

## 2016-12-21 MED ORDER — MEPERIDINE HCL 50 MG/ML IJ SOLN
INTRAMUSCULAR | Status: AC
  Filled 2016-12-21: qty 1

## 2016-12-21 MED ORDER — MIDAZOLAM HCL 5 MG/5ML IJ SOLN
INTRAMUSCULAR | Status: AC
  Filled 2016-12-21: qty 10

## 2016-12-21 MED ORDER — LIDOCAINE VISCOUS 2 % MT SOLN
OROMUCOSAL | Status: AC
  Filled 2016-12-21: qty 15

## 2016-12-21 NOTE — Op Note (Signed)
[facility] Patient Name: [name] [name] Procedure Date: 12/21/2016 1:04 PM MRN: [id number] Date of Birth: 01/20/1941 Attending MD: Hildred Laser , MD CSN: [id number] Age: 76 Admit Type: Outpatient Procedure:                Upper GI endoscopy Indications:              Iron deficiency anemia secondary to chronic blood                            loss Providers:                Hildred Laser, MD, Hinton Rao, RN, Randa Spike, Technician Referring MD:             Fran Lowes, MD and Dr. Tula Nakayama, MD Medicines:                Lidocaine spray, Meperidine 50 mg IV, Midazolam 5                            mg IV Complications:            No immediate complications. Estimated Blood Loss:     Estimated blood loss was minimal. Procedure:                Pre-Anesthesia Assessment:                           - Prior to the procedure, a History and Physical                            was performed, and patient medications and                            allergies were reviewed. The patient's tolerance of                            previous anesthesia was also reviewed. The risks                            and benefits of the procedure and the sedation                            options and risks were discussed with the patient.                            All questions were answered, and informed consent                            was obtained. Prior Anticoagulants: The patient                            last took aspirin 6 days prior to the procedure.  ASA Grade Assessment: III - A patient with severe                            systemic disease. After reviewing the risks and                            benefits, the patient was deemed in satisfactory                            condition to undergo the procedure.                           After obtaining informed consent, the endoscope was                            passed under  direct vision. Throughout the                            procedure, the patient's blood pressure, pulse, and                            oxygen saturations were monitored continuously. The                            [id number]-[id number] ([id number]) scope was introduced through the                            mouth, and advanced to the second part of duodenum.                            The upper GI endoscopy was accomplished without                            difficulty. The patient tolerated the procedure                            well. Scope In: 1:29:23 PM Scope Out: 1:43:33 PM Total Procedure Duration: 0 hours 14 minutes 10 seconds  Findings:      The examined esophagus was normal.      The Z-line was regular and was found 39 cm from the incisors.      A 2 cm hiatal hernia was present.      Patchy mild inflammation characterized by congestion (edema), erosions       and erythema was found in the gastric antrum. Biopsies were taken with a       cold forceps for histology.      The exam of the stomach was otherwise normal.      Red blood was found in the duodenal bulb.      Four small angiodysplastic lesions; one with bleeding were found in the       duodenal bulb. Coagulation for hemostasis using argon beam was       successful.      The second portion of the duodenum was normal. Impression:               -  Normal esophagus.                           - Z-line regular, 39 cm from the incisors.                           - 2 cm hiatal hernia.                           - Gastritis. Biopsied.                           - Blood in the duodenal bulb.                           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 One                            with active bleeding/oozing. Treated with argon                            beam coagulation.                           - Normal second portion of the duodenum. Moderate Sedation:      Moderate (conscious) sedation was administered by the endoscopy nurse        and supervised by the endoscopist. The following parameters were       monitored: oxygen saturation, heart rate, blood pressure, CO2       capnography and response to care. Total physician intraservice time was       19 minutes. Recommendation:           - Patient has a contact number available for                            emergencies. The signs and symptoms of potential                            delayed complications were discussed with the                            patient. Return to normal activities tomorrow.                            Written discharge instructions were provided to the                            patient.                           - Resume previous diet today.                           - Continue present medications.                           - No aspirin, ibuprofen, naproxen,  or other                            non-steroidal anti-inflammatory drugs for 3 days.                           - will check H&H today.                           - Await pathology results. Procedure Code(s):        --- Professional ---                           [phone], Esophagogastroduodenoscopy, flexible,                            transoral; with biopsy, single or multiple                           99152, Moderate sedation services provided by the                            same physician or other qualified health care                            professional performing the diagnostic or                            therapeutic service that the sedation supports,                            requiring the presence of an independent trained                            observer to assist in the monitoring of the                            patient's level of consciousness and physiological                            status; initial 15 minutes of intraservice time,                            patient age 5 years or older Diagnosis Code(s):        --- Professional ---                            K44.9, Diaphragmatic hernia without obstruction or                            gangrene                           K29.70, Gastritis, unspecified, without bleeding  K92.2, Gastrointestinal hemorrhage, unspecified                           K31.811, Angiodysplasia of stomach and duodenum                            with bleeding                           D50.0, Iron deficiency anemia secondary to blood                            loss (chronic) CPT copyright 2016 American [employer] Association. All rights reserved. The codes documented in this report are preliminary and upon coder review may  be revised to meet current compliance requirements. Hildred Laser, MD Hildred Laser, MD 12/21/2016 2:23:57 PM This report has been signed electronically. Number of Addenda: 0

## 2016-12-21 NOTE — H&P (Signed)
[name] [name] is an 76 y.o. female.   Chief Complaint: Patient is here for EGD and colonoscopy. HPI: Patient is 76 year old African female who is here for diagnostic EGD and colonoscopy. She was recently found to have iron deficiency anemia and received iron infusion twice. She denies abdominal pain change in bowel habits melena or rectal bleeding vaginal bleeding or hematuria. Heartburn is well controlled with therapy. She had 3 Hemoccults following a visit to our office and all 3 are positive. He is on low-dose aspirin and does not take other OTC NSAIDs. She has history of colonic adenomas. She had 5 adenomas removed in April 2014. 4 polyps very small largest one was 10 mm. Family history is significant for CRC in her brother who was diagnosed at age 58 and died 2 years later of metastatic disease.  Past Medical History:  Diagnosis Date  . 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 Carotid artery disease ([place])   . CKD (chronic kidney disease), stage III   . Essential hypertension   . Glaucoma    Laser surgery 2010  . Hyperlipidemia   . Hypothyroidism   . Obesity     Past Surgical History:  Procedure Laterality Date  . COLONOSCOPY N/A 02/04/2013   Procedure: COLONOSCOPY;  Surgeon: Rogene Houston, MD;  Location: [facility];  Service: Endoscopy;  Laterality: N/A;  1015  . CORONARY ARTERY BYPASS GRAFT  October 2005   Dr. Roxy Manns - LIMA to LAD, SVG to OM 2 and OM 3, SVG to PDA  . EYE SURGERY  2010   laser treatment to both eyes for glaucoma    Family History  Problem Relation Age of Onset  . Aneurysm Mother   . Hypertension Mother   . Coronary artery disease Mother   . Coronary artery disease Father   . Diabetes Son   . Pancreatic cancer Son   . Colon cancer Brother 41       lived 18 months afterwards  . Liver cancer Brother 81  . Diabetes Sister   . Coronary artery disease Sister   . Diverticulosis Sister   . COPD Sister   . Kidney  disease Sister   . Diabetes Sister   . Hypertension Sister   . Breast cancer Sister 67  . Hearing loss Sister   . Heart Problems Sister        pacemaker   Social History:  reports that she quit smoking about 13 years ago. Her smoking use included Cigarettes. She has a 10.00 pack-year smoking history. She has never used smokeless tobacco. She reports that she does not drink alcohol or use drugs.  Allergies:  Allergies  Allergen Reactions  . Clonidine Derivatives Other (See Comments)    Zombie BP dropped    Medications Prior to Admission  Medication Sig Dispense Refill  . acetaminophen (TYLENOL) 500 MG tablet Take 1,000 mg by mouth every 6 (six) hours as needed for mild pain or headache.     . albuterol (PROAIR HFA) 108 (90 Base) MCG/ACT inhaler INHALE ONE PUFF EVERY FOUR HOURS AS NEEDED. (Patient taking differently: Inhale 2 puffs into the lungs every 4 (four) hours as needed for wheezing or shortness of breath. INHALE ONE PUFF EVERY FOUR HOURS AS NEEDED.) 8.5 g 3  . amLODipine (NORVASC) 5 MG tablet Take 1 tablet (5 mg total) by mouth daily. 90 tablet 1  . aspirin 81 MG tablet Take 81 mg by mouth daily.    Marland Kitchen  cetirizine (ZYRTEC) 10 MG tablet TAKE ONE TABLET BY MOUTH DAILY. 90 tablet 1  . Cholecalciferol (VITAMIN D3) 1000 UNITS CAPS Take 1 capsule by mouth daily.    . colchicine 0.6 MG tablet Half tablet once daily (Patient taking differently: Take 0.3 mg by mouth daily. Half tablet once daily) 15 tablet 4  . fish oil-omega-3 fatty acids 1000 MG capsule Take 1 g by mouth daily.     . fluticasone (FLONASE) 50 MCG/ACT nasal spray USE 2 SPRAYS IN EACH NOSTRIL ONCE DAILY.  SHAKE GENTLY BEFORE USING. (Patient taking differently: USE 1 SPRAYS IN EACH NOSTRIL ONCE DAILY AS NEEDED FOR ALLERGIES.  SHAKE GENTLY BEFORE USING.) 48 g 1  . GAVILYTE-N WITH FLAVOR PACK 420 g solution MIX AND DRINK 4,020mLS BY MOUTH ONCE AS DIRECTED.  0  . hydrALAZINE (APRESOLINE) 25 MG tablet Take 1 tablet (25 mg total)  by mouth 2 (two) times daily. (Patient taking differently: Take 25 mg by mouth daily. ) 180 tablet 3  . HYDROcodone-acetaminophen (NORCO/VICODIN) 5-325 MG tablet Take 1 tablet by mouth every 4 (four) hours as needed for moderate pain. 20 tablet 0  . latanoprost (XALATAN) 0.005 % ophthalmic solution Place 1 drop into both eyes at bedtime. One drop each eye at bedtime     . levothyroxine (SYNTHROID, LEVOTHROID) 75 MCG tablet TAKE ONE TABLET BY MOUTH DAILY 90 tablet 1  . metoprolol succinate (TOPROL-XL) 100 MG 24 hr tablet TAKE ONE TABLET BY MOUTH DAILY. TAKE WITH OR IMMEDIATELY FOLLOWING A MEAL. (Patient taking differently: Take 100 mg by mouth daily. TAKE WITH OR IMMEDIATELY FOLLOWING A MEAL.) 90 tablet 1  . Multiple Vitamins-Minerals (THERATRUM COMPLETE 50 PLUS PO) Take 1 tablet by mouth daily.     Marland Kitchen omeprazole (PRILOSEC) 40 MG capsule Take 1 capsule (40 mg total) by mouth daily. 90 capsule 1  . Polyethyl Glycol-Propyl Glycol (SYSTANE ULTRA OP) Apply 1 drop to eye as needed (dry eyes).    . rosuvastatin (CRESTOR) 10 MG tablet TAKE ONE TABLET BY MOUTH DAILY 90 tablet 1  . spironolactone (ALDACTONE) 25 MG tablet Take 1 tablet (25 mg total) by mouth daily. 90 tablet 1  . valsartan-hydrochlorothiazide (DIOVAN-HCT) 320-25 MG tablet TAKE ONE TABLET BY MOUTH DAILY 90 tablet 1  . predniSONE (DELTASONE) 20 MG tablet Take one tablet two times a day with food for 3 days then one a day for a week (Patient not taking: Reported on 12/18/2016) 13 tablet 0    No results found for this or any previous visit (from the past 48 hour(s)). No results found.  ROS  Blood pressure (!) 165/63, pulse 64, temperature 97.8 F (36.6 C), temperature source Oral, resp. rate 18, SpO2 96 %. Physical Exam  Constitutional: She appears well-developed and well-nourished.  HENT:  Mouth/Throat: Oropharynx is clear and moist.  Eyes: Conjunctivae are normal. No scleral icterus.  Neck: No thyromegaly present.  Cardiovascular: Normal  rate, regular rhythm and normal heart sounds.   No murmur heard. Respiratory: Effort normal and breath sounds normal.  GI:  Abdomen is full with small umbilical hernia. Abdomen is soft and nontender without organomegaly or masses.  Musculoskeletal: She exhibits no edema.  Neurological: She is alert.  Skin: Skin is warm and dry.     Assessment/Plan Iron deficiency anemia secondary to occult GI bleed. History of colonic adenomas and family history of CRC in first-degree relative. Diagnostic EGD and colonoscopy.  Hildred Laser, MD 12/21/2016, 1:18 PM

## 2016-12-21 NOTE — Op Note (Signed)
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 Tula Nakayama, MD Medicines:                Midazolam 2 mg IV Complications:            No immediate complications. Estimated Blood Loss:     Estimated blood loss was minimal. Procedure:                Pre-Anesthesia Assessment:                           - Prior to the procedure, a History and Physical                            was performed, and patient medications and                            allergies were reviewed. The patient's tolerance of                            previous anesthesia was also reviewed. The risks                            and benefits of the procedure and the sedation                            options and risks were discussed with the patient.                            All questions were answered, and informed consent                            was obtained. Prior Anticoagulants: The patient                            last took aspirin 6 days prior to the procedure.                            ASA Grade Assessment: III - A patient with severe  systemic disease. After reviewing the risks and                            benefits, the patient was deemed in satisfactory                            condition to undergo the procedure.                           After obtaining informed consent, the colonoscope                            was passed under direct vision. Throughout the                  procedure, the patient's blood pressure, pulse, and                            oxygen saturations were monitored continuously. The                            EC-[id number] ([id number]) scope was introduced through                            the anus and advanced to the the cecum, identified                            by appendiceal orifice and ileocecal valve. The                            colonoscopy was performed without difficulty. The                            patient tolerated the procedure well. The quality                            of the bowel preparation was excellent. The                            ileocecal valve, appendiceal orifice, and rectum                            were photographed. Scope In: 1:48:53 PM Scope Out: 2:00:54 PM Scope Withdrawal Time: 0 hours 5 minutes 9 seconds  Total Procedure Duration: 0 hours 12 minutes 1 second  Findings:      The perianal and digital rectal examinations were normal.      Two sessile polyps were found in the sigmoid colon and proximal       transverse colon. The polyps were small in size. These were biopsied       with a cold forceps for microbiology.      Many medium-mouthed diverticula were found in the sigmoid colon. Impression:               - Two small polyps in the sigmoid colon and in the  proximal transverse colon. Biopsied.                           - Diverticulosis in the sigmoid colon. Moderate Sedation:      Moderate (conscious) sedation was administered by the endoscopy nurse       and supervised by the endoscopist. The following parameters were       monitored: oxygen saturation, heart rate, blood pressure, CO2       capnography and response to care. Total physician intraservice time was       36 minutes. Recommendation:           - Patient has a contact number available for                            emergencies. The signs and symptoms of potential                             delayed complications were discussed with the                            patient. Return to normal activities tomorrow.                            Written discharge instructions were provided to the                            patient.                           - High fiber diet today.                           - Continue present medications.                           - Await pathology results.                           - Flintstone chewable 1 ablet by mouth twice daily.                           - Repeat colonoscopy in 5 years for surveillance. Procedure Code(s):        --- Professional ---                           [phone], Colonoscopy, flexible; with biopsy, single                            or multiple                           99152, Moderate sedation services provided by the                            same physician or other qualified health care  professional performing the diagnostic or                            therapeutic service that the sedation supports,                            requiring the presence of an independent trained                            observer to assist in the monitoring of the                            patient's level of consciousness and physiological                            status; initial 15 minutes of intraservice time,                            patient age 79 years or older                           [phone], Moderate sedation services; each additional                            15 minutes intraservice time Diagnosis Code(s):        --- Professional ---                           D12.5, Benign neoplasm of sigmoid colon                           D12.3, Benign neoplasm of transverse colon (hepatic                            flexure or splenic flexure)                           D50.0, Iron deficiency anemia secondary to blood                            loss (chronic)                           K57.30, Diverticulosis of large  intestine without                            perforation or abscess without bleeding CPT copyright 2016 American Medical Association. All rights reserved. The codes documented in this report are preliminary and upon coder review may  be revised to meet current compliance requirements. Hildred Laser, MD Hildred Laser, MD 12/21/2016 2:28:29 PM This report has been signed electronically. Number of Addenda: 0

## 2016-12-21 NOTE — Discharge Instructions (Signed)
Resume aspirin on 12/24/2016. Resume other medications and diet as before. Flintstones chewable 1 tablet twice daily. Physician will call with results of blood test and biopsy. No driving for 24 hours.   Colonoscopy, Adult, Care After This sheet gives you information about how to care for yourself after your procedure. Your health care provider may also give you more specific instructions. If you have problems or questions, contact your health care provider. What can I expect after the procedure? After the procedure, it is common to have:  A small amount of blood in your stool for 24 hours after the procedure.  Some gas.  Mild abdominal cramping or bloating.  Follow these instructions at home: General instructions   For the first 24 hours after the procedure: ? Do not drive or use machinery. ? Do not sign important documents. ? Do not drink alcohol. ? Do your regular daily activities at a slower pace than normal. ? Eat soft, easy-to-digest foods. ? Rest often.  Take over-the-counter or prescription medicines only as told by your health care provider.  It is up to you to get the results of your procedure. Ask your health care provider, or the department performing the procedure, when your results will be ready. Relieving cramping and bloating  Try walking around when you have cramps or feel bloated.  Apply heat to your abdomen as told by your health care provider. Use a heat source that your health care provider recommends, such as a moist heat pack or a heating pad. ? Place a towel between your skin and the heat source. ? Leave the heat on for 20-30 minutes. ? Remove the heat if your skin turns bright red. This is especially important if you are unable to feel pain, heat, or cold. You may have a greater risk of getting burned. Eating and drinking  Drink enough fluid to keep your urine clear or pale yellow.  Resume your normal diet as instructed by your health care  provider. Avoid heavy or fried foods that are hard to digest.  Avoid drinking alcohol for as long as instructed by your health care provider. Contact a health care provider if:  You have blood in your stool 2-3 days after the procedure. Get help right away if:  You have more than a small spotting of blood in your stool.  You pass large blood clots in your stool.  Your abdomen is swollen.  You have nausea or vomiting.  You have a fever.  You have increasing abdominal pain that is not relieved with medicine. This information is not intended to replace advice given to you by your health care provider. Make sure you discuss any questions you have with your health care provider. Document Released: 01/31/2004 Document Revised: 03/12/2016 Document Reviewed: 08/30/2015 Elsevier Interactive Patient Education  2018 [employer].  Esophagogastroduodenoscopy, Care After Refer to this sheet in the next few weeks. These instructions provide you with information about caring for yourself after your procedure. Your health care provider may also give you more specific instructions. Your treatment has been planned according to current medical practices, but problems sometimes occur. Call your health care provider if you have any problems or questions after your procedure. What can I expect after the procedure? After the procedure, it is common to have:  A sore throat.  Nausea.  Bloating.  Dizziness.  Fatigue.  Follow these instructions at home:  Do not eat or drink anything until the numbing medicine (local anesthetic) has worn off and  your gag reflex has returned. You will know that the local anesthetic has worn off when you can swallow comfortably.  Do not drive for 24 hours if you received a medicine to help you relax (sedative).  If your health care provider took a tissue sample for testing during the procedure, make sure to get your test results. This is your responsibility. Ask your  health care provider or the department performing the test when your results will be ready.  Keep all follow-up visits as told by your health care provider. This is important. Contact a health care provider if:  You cannot stop coughing.  You are not urinating.  You are urinating less than usual. Get help right away if:  You have trouble swallowing.  You cannot eat or drink.  You have throat or chest pain that gets worse.  You are dizzy or light-headed.  You faint.  You have nausea or vomiting.  You have chills.  You have a fever.  You have severe abdominal pain.  You have black, tarry, or bloody stools. This information is not intended to replace advice given to you by your health care provider. Make sure you discuss any questions you have with your health care provider. Document Released: 06/04/2012 Document Revised: 11/24/2015 Document Reviewed: 05/12/2015 Elsevier Interactive Patient Education  [employer].

## 2016-12-25 ENCOUNTER — Encounter (HOSPITAL_COMMUNITY): Payer: Self-pay | Admitting: Internal Medicine

## 2016-12-27 ENCOUNTER — Encounter (HOSPITAL_COMMUNITY): Payer: Self-pay | Admitting: Internal Medicine

## 2017-01-23 DIAGNOSIS — D508 Other iron deficiency anemias: Secondary | ICD-10-CM | POA: Diagnosis not present

## 2017-01-23 LAB — CBC
HCT: 31.4 % — ABNORMAL LOW (ref 35.0–45.0)
HEMOGLOBIN: 10 g/dL — AB (ref 11.7–15.5)
MCH: 29.1 pg (ref 27.0–33.0)
MCHC: 31.8 g/dL — ABNORMAL LOW (ref 32.0–36.0)
MCV: 91.3 fL (ref 80.0–100.0)
MPV: 11.1 fL (ref 7.5–12.5)
PLATELETS: 188 10*3/uL (ref 140–400)
RBC: 3.44 MIL/uL — ABNORMAL LOW (ref 3.80–5.10)
RDW: 16.2 % — ABNORMAL HIGH (ref 11.0–15.0)
WBC: 7.7 10*3/uL (ref 3.8–10.8)

## 2017-01-30 ENCOUNTER — Other Ambulatory Visit (INDEPENDENT_AMBULATORY_CARE_PROVIDER_SITE_OTHER): Payer: Self-pay | Admitting: *Deleted

## 2017-01-30 DIAGNOSIS — D508 Other iron deficiency anemias: Secondary | ICD-10-CM

## 2017-02-01 ENCOUNTER — Other Ambulatory Visit: Payer: Self-pay

## 2017-02-01 ENCOUNTER — Telehealth: Payer: Self-pay | Admitting: *Deleted

## 2017-02-01 ENCOUNTER — Other Ambulatory Visit: Payer: Self-pay | Admitting: Family Medicine

## 2017-02-01 MED ORDER — OLMESARTAN MEDOXOMIL-HCTZ 40-25 MG PO TABS: 1.0000 | ORAL_TABLET | Freq: Every day | ORAL | 4 refills | Status: DC

## 2017-02-01 NOTE — Telephone Encounter (Signed)
Patient called stating valsartan has been recalled, the pharmacy told her to call and get a replacement. Please advise

## 2017-02-01 NOTE — Telephone Encounter (Signed)
Printed script for olmasartan/hctz in place of valsartan, pls notify pt and pharmacy and send

## 2017-02-01 NOTE — Telephone Encounter (Signed)
Replacement sent in

## 2017-02-19 ENCOUNTER — Telehealth: Payer: Self-pay | Admitting: Family Medicine

## 2017-02-19 DIAGNOSIS — I10 Essential (primary) hypertension: Secondary | ICD-10-CM

## 2017-02-19 DIAGNOSIS — E038 Other specified hypothyroidism: Secondary | ICD-10-CM

## 2017-02-19 DIAGNOSIS — E785 Hyperlipidemia, unspecified: Secondary | ICD-10-CM

## 2017-02-19 NOTE — Telephone Encounter (Signed)
Patient calling to request that her lab order be sent to [facility]).  She will be headed there tomorrow.

## 2017-02-19 NOTE — Telephone Encounter (Signed)
Lab faxed

## 2017-02-20 ENCOUNTER — Telehealth: Payer: Self-pay | Admitting: Family Medicine

## 2017-02-20 ENCOUNTER — Other Ambulatory Visit: Payer: Self-pay

## 2017-02-20 DIAGNOSIS — I10 Essential (primary) hypertension: Secondary | ICD-10-CM | POA: Diagnosis not present

## 2017-02-20 DIAGNOSIS — E785 Hyperlipidemia, unspecified: Secondary | ICD-10-CM | POA: Diagnosis not present

## 2017-02-20 MED ORDER — SODIUM POLYSTYRENE SULFONATE 15 GM/60ML PO SUSP: ORAL | 1 refills | Status: DC

## 2017-02-20 NOTE — Telephone Encounter (Signed)
Pls call pt. Her potassium is high at 5.7 Upper normal is 5.1 Needs kayelate x 2 daises I have sent t entered please fax

## 2017-02-20 NOTE — Telephone Encounter (Signed)
Patient aware.

## 2017-02-25 ENCOUNTER — Ambulatory Visit (INDEPENDENT_AMBULATORY_CARE_PROVIDER_SITE_OTHER): Payer: Medicare Other | Admitting: Family Medicine

## 2017-02-25 ENCOUNTER — Encounter: Payer: Self-pay | Admitting: Family Medicine

## 2017-02-25 VITALS — BP 142/62 | HR 65 | Resp 16 | Ht 61.0 in | Wt 197.0 lb

## 2017-02-25 DIAGNOSIS — Z23 Encounter for immunization: Secondary | ICD-10-CM

## 2017-02-25 DIAGNOSIS — I10 Essential (primary) hypertension: Secondary | ICD-10-CM | POA: Diagnosis not present

## 2017-02-25 DIAGNOSIS — E785 Hyperlipidemia, unspecified: Secondary | ICD-10-CM | POA: Diagnosis not present

## 2017-02-25 DIAGNOSIS — N189 Chronic kidney disease, unspecified: Secondary | ICD-10-CM

## 2017-02-25 DIAGNOSIS — Z0000 Encounter for general adult medical examination without abnormal findings: Secondary | ICD-10-CM

## 2017-02-25 MED ORDER — LOSARTAN POTASSIUM-HCTZ 100-25 MG PO TABS: 1.0000 | ORAL_TABLET | Freq: Every day | ORAL | 3 refills | Status: DC

## 2017-02-25 NOTE — Patient Instructions (Addendum)
F/u in January, call if you need me before  Chem 7 today  New for blood  Pressure is losartan/hCTZ instead of benicar/hCTZ since you say this causes you to cough and to wheeze   I will be in touch with Dr Birdie Sons and get back to you    Fasting lipid, cmp and eGFR and tSH in January 5 days before  Thank you  for choosing [facility]. We consider it a privelige to serve you.  Delivering excellent health care in a caring and  compassionate way is our goal.  Partnering with you,  so that together we can achieve this goal is our strategy.

## 2017-02-26 LAB — BASIC METABOLIC PANEL
BUN: 44 mg/dL — ABNORMAL HIGH (ref 7–25)
CO2: 19 mmol/L — ABNORMAL LOW (ref 20–32)
CREATININE: 1.73 mg/dL — AB (ref 0.60–0.93)
Calcium: 10.1 mg/dL (ref 8.6–10.4)
Chloride: 109 mmol/L (ref 98–110)
Glucose, Bld: 100 mg/dL — ABNORMAL HIGH (ref 65–99)
Potassium: 5.2 mmol/L (ref 3.5–5.3)
Sodium: 141 mmol/L (ref 135–146)

## 2017-03-03 NOTE — Assessment & Plan Note (Signed)
Fairly well controlled , however , pt reports intolerance to current medication with cough, will change to another ARB/HCTZ combination in the interim DASH diet and commitment to daily physical activity for a minimum of 30 minutes discussed and encouraged, as a part of hypertension management. The importance of attaining a healthy weight is also discussed.  BP/Weight 02/25/2017 12/21/2016 12/04/2016 12/04/2016 11/21/2016 11/14/2016 6/72/5500  Systolic BP 164 290 379 [phone] 552  Diastolic BP 62 56 78 64 58 54 62  Wt. (Lbs) 197 - 201.01 200.5 - 205 208.12  BMI 37.22 - 37.98 37.88 - 38.73 39.32     Nurse bP check in 5 to 6 weeks

## 2017-03-03 NOTE — Assessment & Plan Note (Signed)
Stable refer to Dr Birdie Sons for follow up per pt request , his office is also aware

## 2017-03-03 NOTE — Assessment & Plan Note (Signed)
Annual exam as documented. . Immunization and cancer screening needs are specifically addressed at this visit.  

## 2017-03-03 NOTE — Progress Notes (Signed)
    [name] [name]     MRN: [id number]      DOB: 1941-06-02  HPI: Patient is in for annual physical exam. Blood pressure medication management is addressed as she c/o cough with her new medication Recent labs were excellent except for elevated potassium , repeat potassium on day of visit will be followed up closely, she has had one dose of kayexelate  Immunization is reviewed , and  updated    PE: BP (!) 142/62   Pulse 65   Resp 16   Ht 5\' 1"  (1.549 m)   Wt 197 lb (89.4 kg)   SpO2 96%   BMI 37.22 kg/m   Pleasant  female, alert and oriented x 3, in no cardio-pulmonary distress. Afebrile. HEENT No facial trauma or asymetry. Sinuses non tender.  Extra occullar muscles intact, . External ears normal, tympanic membranes clear. Oropharynx moist, no exudate. Neck: supple, no adenopathy,JVD or thyromegaly.No bruits.  Chest: Clear to ascultation bilaterally.No crackles or wheezes. Non tender to palpation  Breast: No asymetry,no masses or lumps. No tenderness. No nipple discharge or inversion. No axillary or supraclavicular adenopathy  Cardiovascular system; Heart sounds normal,  S1 and  S2 ,no S3.  No murmur, or thrill. Apical beat not displaced Peripheral pulses normal.  Abdomen: Soft, non tender, no organomegaly or masses. No bruits. Bowel sounds normal. No guarding, tenderness or rebound.  Rectal:  Not done, patient had coloscopy earlier this year, not indicated  GU: Not examined. Asymptomatic, therefore not indicated  Musculoskeletal exam: Decreased though adequate  ROM of spine, hips , shoulders and knees. No deformity ,swelling or crepitus noted. No muscle wasting or atrophy.   Neurologic: Cranial nerves 2 to 12 intact. Power, tone ,sensation and reflexes normal throughout. No disturbance in gait. No tremor.  Skin: Intact, no ulceration, erythema , scaling or rash noted. Pigmentation normal throughout  Psych; Normal mood and affect. Judgement and  concentration normal   Assessment & Plan:  Annual physical exam Annual exam as documented.  Immunization and cancer screening needs are specifically addressed at this visit.   Essential hypertension, malignant Fairly well controlled , however , pt reports intolerance to current medication with cough, will change to another ARB/HCTZ combination in the interim DASH diet and commitment to daily physical activity for a minimum of 30 minutes discussed and encouraged, as a part of hypertension management. The importance of attaining a healthy weight is also discussed.  BP/Weight 02/25/2017 12/21/2016 12/04/2016 12/04/2016 11/21/2016 11/14/2016 8/52/7782  Systolic BP 423 536 144 315 [phone]  Diastolic BP 62 56 78 64 58 54 62  Wt. (Lbs) 197 - 201.01 200.5 - 205 208.12  BMI 37.22 - 37.98 37.88 - 38.73 39.32     Nurse bP check in 5 to 6 weeks  CKD (chronic kidney disease) Stable refer to Dr Birdie Sons for follow up per pt request , his office is also aware

## 2017-03-14 ENCOUNTER — Telehealth: Payer: Self-pay | Admitting: Cardiology

## 2017-03-14 NOTE — Telephone Encounter (Signed)
Carotids Aug 28, 2017   In [place]

## 2017-03-29 ENCOUNTER — Other Ambulatory Visit: Payer: Self-pay | Admitting: Family Medicine

## 2017-04-02 ENCOUNTER — Other Ambulatory Visit: Payer: Self-pay | Admitting: Family Medicine

## 2017-04-09 ENCOUNTER — Other Ambulatory Visit: Payer: Self-pay | Admitting: Family Medicine

## 2017-04-09 NOTE — Telephone Encounter (Signed)
Seen [phone]

## 2017-04-16 DIAGNOSIS — E119 Type 2 diabetes mellitus without complications: Secondary | ICD-10-CM | POA: Diagnosis not present

## 2017-04-16 DIAGNOSIS — H04123 Dry eye syndrome of bilateral lacrimal glands: Secondary | ICD-10-CM | POA: Diagnosis not present

## 2017-04-16 DIAGNOSIS — H2513 Age-related nuclear cataract, bilateral: Secondary | ICD-10-CM | POA: Diagnosis not present

## 2017-04-16 DIAGNOSIS — H40053 Ocular hypertension, bilateral: Secondary | ICD-10-CM | POA: Diagnosis not present

## 2017-04-25 ENCOUNTER — Other Ambulatory Visit (INDEPENDENT_AMBULATORY_CARE_PROVIDER_SITE_OTHER): Payer: Self-pay | Admitting: *Deleted

## 2017-04-25 ENCOUNTER — Encounter (INDEPENDENT_AMBULATORY_CARE_PROVIDER_SITE_OTHER): Payer: Self-pay | Admitting: *Deleted

## 2017-04-25 DIAGNOSIS — D508 Other iron deficiency anemias: Secondary | ICD-10-CM

## 2017-04-30 DIAGNOSIS — N183 Chronic kidney disease, stage 3 (moderate): Secondary | ICD-10-CM | POA: Diagnosis not present

## 2017-04-30 DIAGNOSIS — Z79899 Other long term (current) drug therapy: Secondary | ICD-10-CM | POA: Diagnosis not present

## 2017-04-30 DIAGNOSIS — E559 Vitamin D deficiency, unspecified: Secondary | ICD-10-CM | POA: Diagnosis not present

## 2017-04-30 DIAGNOSIS — R809 Proteinuria, unspecified: Secondary | ICD-10-CM | POA: Diagnosis not present

## 2017-04-30 DIAGNOSIS — D509 Iron deficiency anemia, unspecified: Secondary | ICD-10-CM | POA: Diagnosis not present

## 2017-04-30 DIAGNOSIS — I129 Hypertensive chronic kidney disease with stage 1 through stage 4 chronic kidney disease, or unspecified chronic kidney disease: Secondary | ICD-10-CM | POA: Diagnosis not present

## 2017-05-06 ENCOUNTER — Other Ambulatory Visit: Payer: Self-pay | Admitting: Family Medicine

## 2017-05-07 DIAGNOSIS — I10 Essential (primary) hypertension: Secondary | ICD-10-CM | POA: Diagnosis not present

## 2017-05-07 DIAGNOSIS — N183 Chronic kidney disease, stage 3 (moderate): Secondary | ICD-10-CM | POA: Diagnosis not present

## 2017-05-07 DIAGNOSIS — R809 Proteinuria, unspecified: Secondary | ICD-10-CM | POA: Diagnosis not present

## 2017-05-07 DIAGNOSIS — D649 Anemia, unspecified: Secondary | ICD-10-CM | POA: Diagnosis not present

## 2017-05-20 DIAGNOSIS — D509 Iron deficiency anemia, unspecified: Secondary | ICD-10-CM | POA: Diagnosis not present

## 2017-05-21 ENCOUNTER — Other Ambulatory Visit: Payer: Self-pay | Admitting: Family Medicine

## 2017-05-27 DIAGNOSIS — D509 Iron deficiency anemia, unspecified: Secondary | ICD-10-CM | POA: Diagnosis not present

## 2017-06-05 ENCOUNTER — Encounter: Payer: Self-pay | Admitting: Family Medicine

## 2017-06-12 ENCOUNTER — Other Ambulatory Visit: Payer: Self-pay | Admitting: Cardiology

## 2017-06-12 DIAGNOSIS — I6523 Occlusion and stenosis of bilateral carotid arteries: Secondary | ICD-10-CM

## 2017-06-14 ENCOUNTER — Telehealth: Payer: Self-pay | Admitting: Family Medicine

## 2017-06-14 NOTE — Telephone Encounter (Signed)
Pt needs cough medicine called in to the pharmacy.

## 2017-06-17 ENCOUNTER — Telehealth: Payer: Self-pay | Admitting: Family Medicine

## 2017-06-17 ENCOUNTER — Other Ambulatory Visit: Payer: Self-pay | Admitting: Family Medicine

## 2017-06-17 MED ORDER — PREDNISONE 5 MG PO TABS: 5.0000 mg | ORAL_TABLET | Freq: Two times a day (BID) | ORAL | 0 refills | Status: AC

## 2017-06-17 MED ORDER — PROMETHAZINE-DM 6.25-15 MG/5ML PO SYRP: ORAL_SOLUTION | ORAL | 0 refills | Status: DC

## 2017-06-17 NOTE — Telephone Encounter (Signed)
Phenergan dm is sent to her pharmacy and I have called to let pt know, denies fever or chills, also prednisosne for 5 days

## 2017-06-17 NOTE — Telephone Encounter (Signed)
Seen [phone]

## 2017-06-17 NOTE — Progress Notes (Signed)
Prednisone

## 2017-06-17 NOTE — Telephone Encounter (Signed)
Patient's husband called in caus patient has had uncontrollable coughing since Friday am, says she called and left a msg with betsy. She is requesting cough medication because she has no other symptoms, she is just looking for some relief. Cb#: [phone] Pharmacy: [facility] in [place]

## 2017-06-21 ENCOUNTER — Other Ambulatory Visit: Payer: Self-pay | Admitting: Family Medicine

## 2017-06-26 ENCOUNTER — Encounter (HOSPITAL_COMMUNITY): Payer: Medicare Other

## 2017-07-09 ENCOUNTER — Other Ambulatory Visit: Payer: Self-pay | Admitting: Family Medicine

## 2017-07-10 DIAGNOSIS — I10 Essential (primary) hypertension: Secondary | ICD-10-CM | POA: Diagnosis not present

## 2017-07-10 DIAGNOSIS — E785 Hyperlipidemia, unspecified: Secondary | ICD-10-CM | POA: Diagnosis not present

## 2017-07-10 LAB — COMPLETE METABOLIC PANEL WITH GFR
AG RATIO: 1.5 (calc) (ref 1.0–2.5)
ALKALINE PHOSPHATASE (APISO): 73 U/L (ref 33–130)
ALT: 18 U/L (ref 6–29)
AST: 23 U/L (ref 10–35)
Albumin: 4.4 g/dL (ref 3.6–5.1)
BILIRUBIN TOTAL: 0.3 mg/dL (ref 0.2–1.2)
BUN/Creatinine Ratio: 19 (calc) (ref 6–22)
BUN: 31 mg/dL — AB (ref 7–25)
CHLORIDE: 106 mmol/L (ref 98–110)
CO2: 25 mmol/L (ref 20–32)
Calcium: 10.5 mg/dL — ABNORMAL HIGH (ref 8.6–10.4)
Creat: 1.66 mg/dL — ABNORMAL HIGH (ref 0.60–0.93)
GFR, Est African American: 35 mL/min/{1.73_m2} — ABNORMAL LOW (ref >=60)
GFR, Est Non African American: 30 mL/min/{1.73_m2} — ABNORMAL LOW (ref >=60)
GLUCOSE: 94 mg/dL (ref 65–99)
Globulin: 3 g/dL (calc) (ref 1.9–3.7)
POTASSIUM: 5 mmol/L (ref 3.5–5.3)
Sodium: 139 mmol/L (ref 135–146)
Total Protein: 7.4 g/dL (ref 6.1–8.1)

## 2017-07-10 LAB — LIPID PANEL
CHOL/HDL RATIO: 3.1 (calc) (ref <5.0)
Cholesterol: 160 mg/dL (ref <200)
HDL: 51 mg/dL (ref >50)
LDL CHOLESTEROL (CALC): 82 mg/dL
NON-HDL CHOLESTEROL (CALC): 109 mg/dL (ref <130)
TRIGLYCERIDES: 163 mg/dL — AB (ref <150)

## 2017-07-10 LAB — TSH: TSH: 2.99 m[IU]/L (ref 0.40–4.50)

## 2017-07-10 IMAGING — MG 2D DIGITAL SCREENING BILATERAL MAMMOGRAM WITH CAD AND ADJUNCT TO
6 of 9 series · 6 of 25 positions shown · non-contrast
Comparison: Previous exam(s).

CLINICAL DATA: Screening.

EXAM:
2D DIGITAL SCREENING BILATERAL MAMMOGRAM WITH CAD AND ADJUNCT TOMO

[L MLO (1 of 2)]
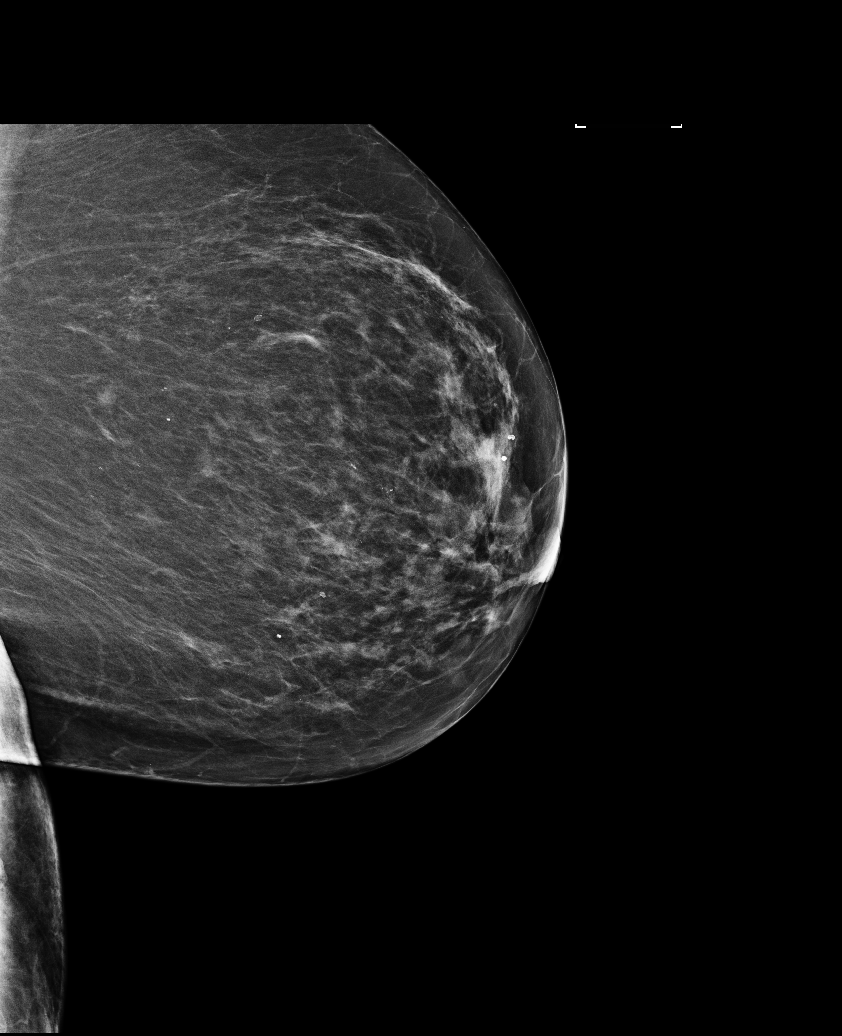

[R MLO]
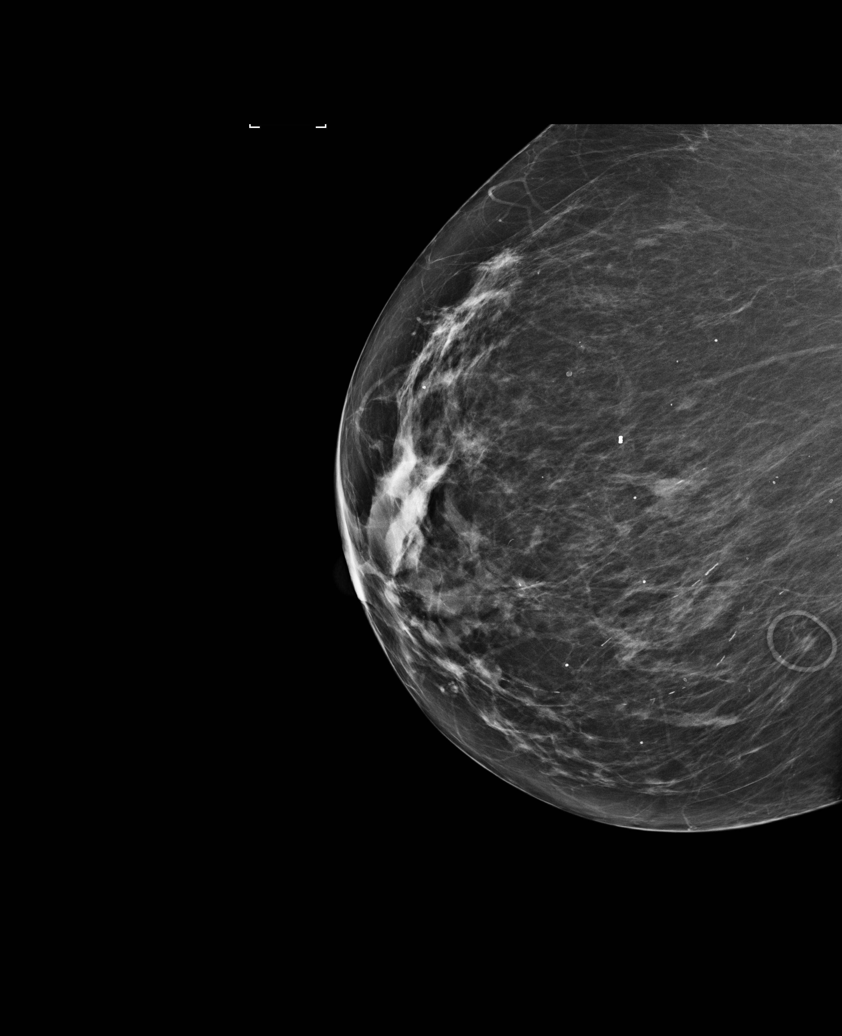

[L CC]
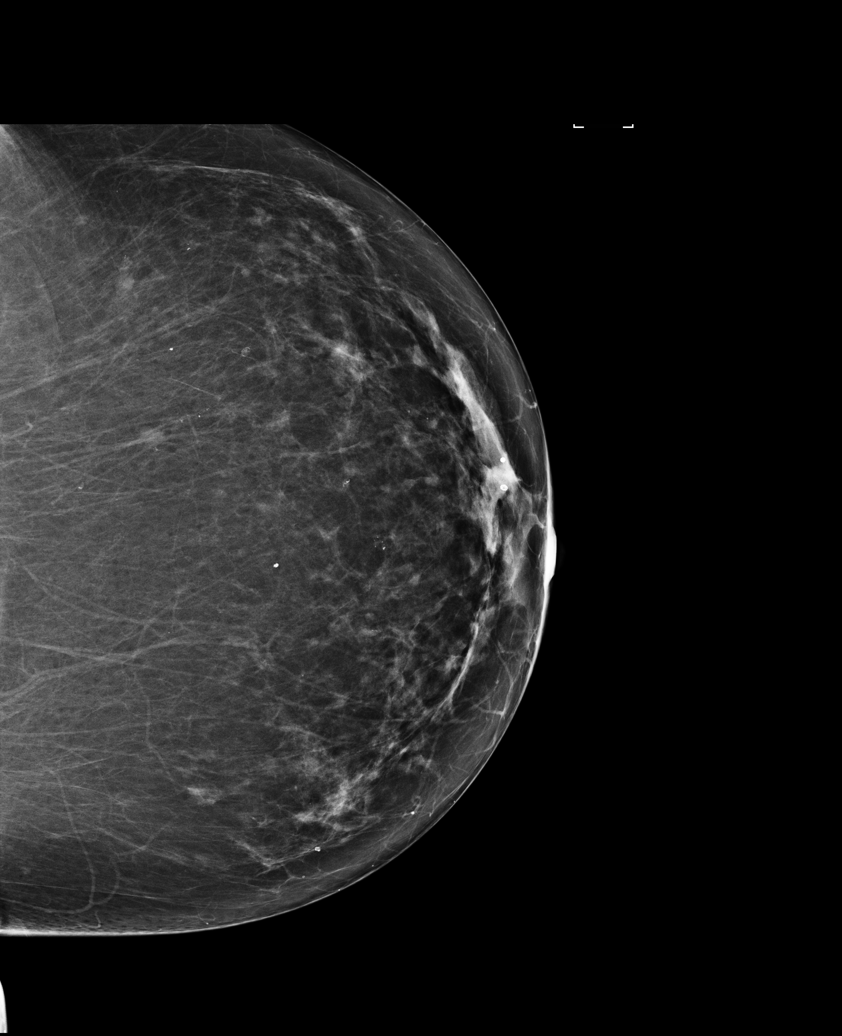

[R CC]
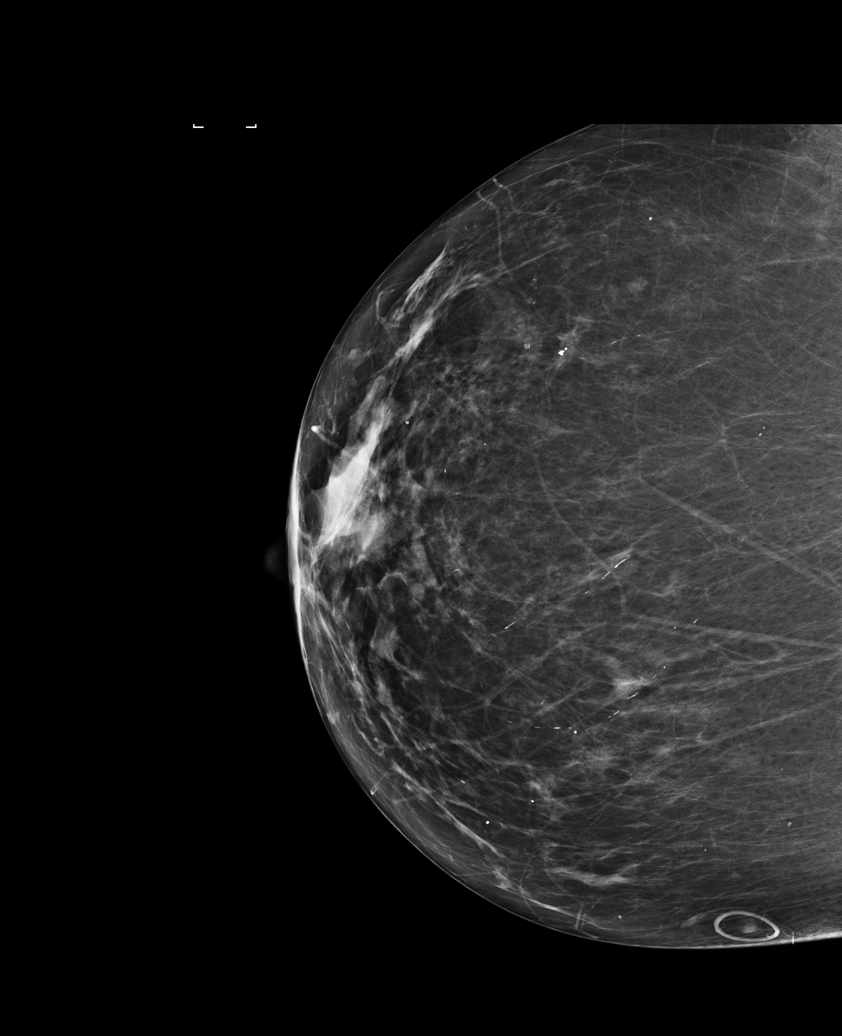

[L MLO (2 of 2)]
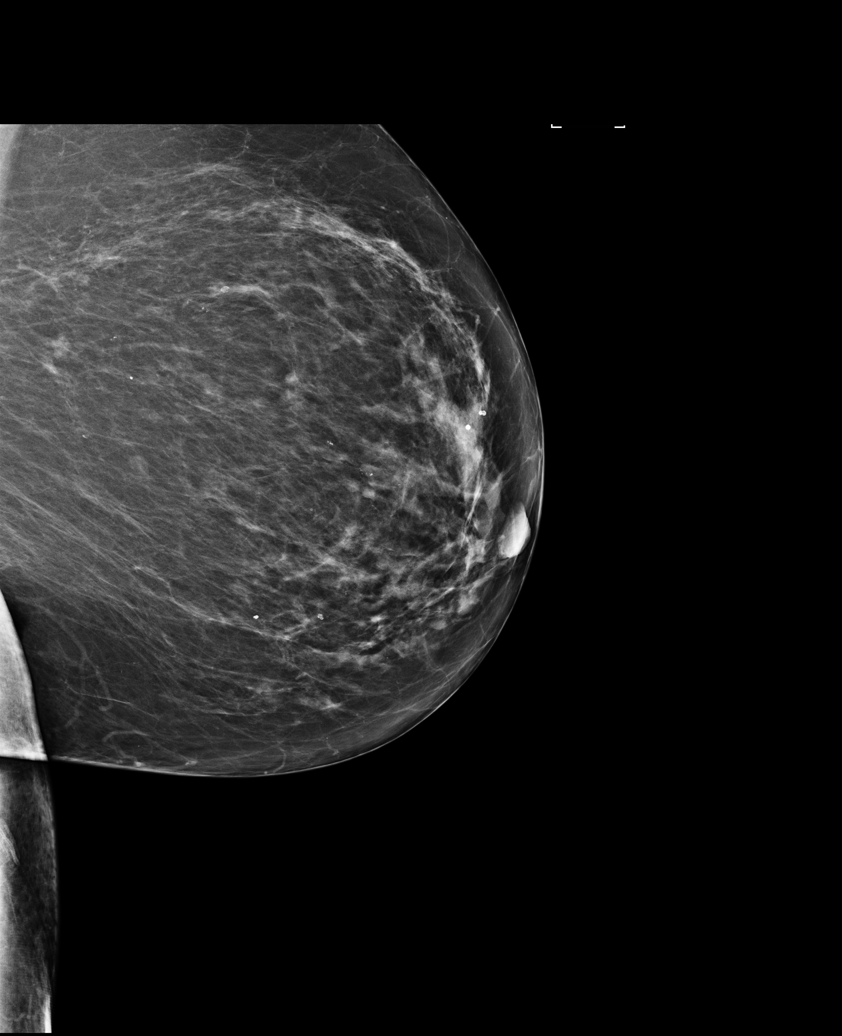

[L CC tomo · tomo slice 38/75.0]
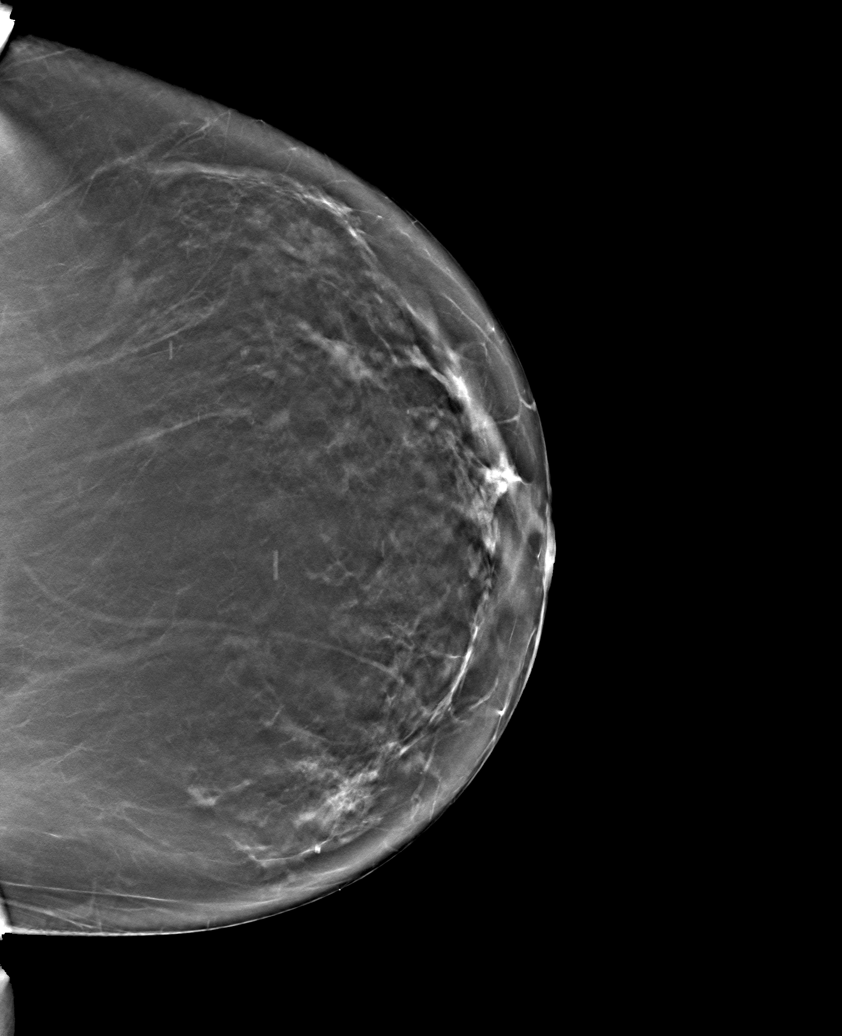

[6 of 25 positions shown; findings below may reference images not displayed]

ACR Breast Density Category b: There are scattered areas of
fibroglandular density.
FINDINGS: There are no findings suspicious for malignancy. Images were
processed with CAD.
IMPRESSION: No mammographic evidence of malignancy. A result letter of this
screening mammogram will be mailed directly to the patient.

RECOMMENDATION:
Screening mammogram in one year. (Code:97-6-RS4)

BI-RADS CATEGORY  1: Negative.

## 2017-07-17 ENCOUNTER — Encounter: Payer: Self-pay | Admitting: Family Medicine

## 2017-07-17 ENCOUNTER — Ambulatory Visit (INDEPENDENT_AMBULATORY_CARE_PROVIDER_SITE_OTHER): Payer: Medicare Other | Admitting: Family Medicine

## 2017-07-17 VITALS — BP 150/72 | HR 61 | Resp 16 | Ht 61.0 in | Wt 193.0 lb

## 2017-07-17 DIAGNOSIS — E785 Hyperlipidemia, unspecified: Secondary | ICD-10-CM

## 2017-07-17 DIAGNOSIS — E038 Other specified hypothyroidism: Secondary | ICD-10-CM | POA: Diagnosis not present

## 2017-07-17 DIAGNOSIS — R7302 Impaired glucose tolerance (oral): Secondary | ICD-10-CM | POA: Diagnosis not present

## 2017-07-17 DIAGNOSIS — I10 Essential (primary) hypertension: Secondary | ICD-10-CM

## 2017-07-17 NOTE — Patient Instructions (Signed)
Physical exam August 28 or after, call if you need me sooner  No change in medication at this time  Please work on continuing to eat healthily  So you lose weight and  Get better blood pressure

## 2017-07-17 NOTE — Progress Notes (Signed)
[name] [name]     MRN: [id number]      DOB: 03/16/1941   HPI [name] [name] is here for follow up and re-evaluation of chronic medical conditions, medication management and review of any available recent lab and radiology data.  Preventive health is updated, specifically  Cancer screening and Immunization.   Questions or concerns regarding consultations or procedures which the PT has had in the interim are  addressed. The PT denies any adverse reactions to current medications since the last visit.  There are no new concerns.  There are no specific complaints   ROS Denies recent fever or chills. Denies sinus pressure, nasal congestion, ear pain or sore throat. Denies chest congestion, productive cough or wheezing. Denies chest pains, palpitations and leg swelling Denies abdominal pain, nausea, vomiting,diarrhea or constipation.   Denies dysuria, frequency, hesitancy or incontinence. Denies joint pain, swelling and limitation in mobility. Denies headaches, seizures, numbness, or tingling. Denies depression, anxiety or insomnia. Denies skin break down or rash.   PE  BP (!) 150/72   Pulse 61   Resp 16   Ht 5\' 1"  (1.549 m)   Wt 193 lb (87.5 kg)   SpO2 94%   BMI 36.47 kg/m   Patient alert and oriented and in no cardiopulmonary distress.  HEENT: No facial asymmetry, EOMI,   oropharynx pink and moist.  Neck supple no JVD, no mass.  Chest: Clear to auscultation bilaterally.  CVS: S1, S2 no murmurs, no S3.Regular rate.  ABD: Soft non tender.   Ext: No edema  MS: Adequate though reudced ROM spine, shoulders, hips and knees.  Skin: Intact, no ulcerations or rash noted.  Psych: Good eye contact, normal affect. Memory intact not anxious or depressed appearing.  CNS: CN 2-12 intact, power,  normal throughout.no focal deficits noted.   Assessment & Plan  Essential hypertension, malignant Sub optimal though adequate control, needs to work on lifestyle change to improve  thois. No additional medication   Hyperlipidemia LDL goal <70 Hyperlipidemia:Low fat diet discussed and encouraged.   Lipid Panel  Lab Results  Component Value Date   CHOL 160 07/10/2017   HDL 51 07/10/2017   LDLCALC 38 12/04/2016   TRIG 163 (H) 07/10/2017   CHOLHDL 3.1 07/10/2017   Reduce fried and fatty foods to optimize tG, no med change   Hypothyroidism Controlled, no change in medication   Morbid obesity Improved, pt congratulated on this almond encouraged to continue. Patient re-educated about  the importance of commitment to a  minimum of 150 minutes of exercise per week.  The importance of healthy food choices with portion control discussed. Encouraged to start a food diary, count calories and to consider  joining a support group. Sample diet sheets offered. Goals set by the patient for the next several months.   Weight /BMI 07/17/2017 02/25/2017 12/04/2016  WEIGHT 193 lb 197 lb 201 lb 0.2 oz  HEIGHT 5\' 1"  5\' 1"  5\' 1"   BMI 36.47 kg/m2 37.22 kg/m2 37.98 kg/m2      IGT (impaired glucose tolerance) Patient educated about the importance of limiting  Carbohydrate intake , the need to commit to daily physical activity for a minimum of 30 minutes , and to commit weight loss. The fact that changes in all these areas will reduce or eliminate all together the development of diabetes is stressed.  Updated lab needed at/ before next visit.   Diabetic Labs Latest Ref Rng & Units 07/10/2017 02/25/2017 12/04/2016 08/16/2016 05/30/2016  HbA1c <5.7 % - - -  5.7(H) -  Microalbumin 0.00 - 1.89 mg/dL - - - - -  Micro/Creat Ratio 0.0 - 30.0 mg/g - - - - -  Chol <200 mg/dL 160 - 106 149 -  HDL >50 mg/dL 51 - 49(L) 45(L) -  Calc LDL <100 mg/dL - - 38 74 -  Triglycerides <150 mg/dL 163(H) - 97 148 -  Creatinine 0.60 - 0.93 mg/dL 1.66(H) 1.73(H) 1.86(H) 1.53(H) 1.55(H)   BP/Weight 07/17/2017 02/25/2017 12/21/2016 12/04/2016 12/04/2016 11/21/2016 10/08/8117  Systolic BP 147 829 562 130 180 865 784    Diastolic BP 72 62 56 78 64 58 54  Wt. (Lbs) 193 197 - 201.01 200.5 - 205  BMI 36.47 37.22 - 37.98 37.88 - 38.73   Foot/eye exam completion dates Latest Ref Rng & Units 04/12/2016 10/05/2014  Eye Exam No Retinopathy No Retinopathy No Retinopathy  Foot exam Order - - -  Foot Form Completion - - -      CKD (chronic kidney disease) Needs to continue to follow with nephrology

## 2017-07-26 ENCOUNTER — Encounter: Payer: Self-pay | Admitting: Family Medicine

## 2017-07-26 NOTE — Assessment & Plan Note (Signed)
Hyperlipidemia:Low fat diet discussed and encouraged.   Lipid Panel  Lab Results  Component Value Date   CHOL 160 07/10/2017   HDL 51 07/10/2017   LDLCALC 38 12/04/2016   TRIG 163 (H) 07/10/2017   CHOLHDL 3.1 07/10/2017   Reduce fried and fatty foods to optimize tG, no med change

## 2017-07-26 NOTE — Assessment & Plan Note (Signed)
Needs to continue to follow with nephrology

## 2017-07-26 NOTE — Assessment & Plan Note (Signed)
Patient educated about the importance of limiting  Carbohydrate intake , the need to commit to daily physical activity for a minimum of 30 minutes , and to commit weight loss. The fact that changes in all these areas will reduce or eliminate all together the development of diabetes is stressed.  Updated lab needed at/ before next visit.   Diabetic Labs Latest Ref Rng & Units 07/10/2017 02/25/2017 12/04/2016 08/16/2016 05/30/2016  HbA1c <5.7 % - - - 5.7(H) -  Microalbumin 0.00 - 1.89 mg/dL - - - - -  Micro/Creat Ratio 0.0 - 30.0 mg/g - - - - -  Chol <200 mg/dL 160 - 106 149 -  HDL >50 mg/dL 51 - 49(L) 45(L) -  Calc LDL <100 mg/dL - - 38 74 -  Triglycerides <150 mg/dL 163(H) - 97 148 -  Creatinine 0.60 - 0.93 mg/dL 1.66(H) 1.73(H) 1.86(H) 1.53(H) 1.55(H)   BP/Weight 07/17/2017 02/25/2017 12/21/2016 12/04/2016 12/04/2016 11/21/2016 4/85/9276  Systolic BP 394 320 037 944 461 901 222  Diastolic BP 72 62 56 78 64 58 54  Wt. (Lbs) 193 197 - 201.01 200.5 - 205  BMI 36.47 37.22 - 37.98 37.88 - 38.73   Foot/eye exam completion dates Latest Ref Rng & Units 04/12/2016 10/05/2014  Eye Exam No Retinopathy No Retinopathy No Retinopathy  Foot exam Order - - -  Foot Form Completion - - -

## 2017-07-26 NOTE — Assessment & Plan Note (Signed)
Controlled, no change in medication  

## 2017-07-26 NOTE — Assessment & Plan Note (Signed)
Improved, pt congratulated on this almond encouraged to continue. Patient re-educated about  the importance of commitment to a  minimum of 150 minutes of exercise per week.  The importance of healthy food choices with portion control discussed. Encouraged to start a food diary, count calories and to consider  joining a support group. Sample diet sheets offered. Goals set by the patient for the next several months.   Weight /BMI 07/17/2017 02/25/2017 12/04/2016  WEIGHT 193 lb 197 lb 201 lb 0.2 oz  HEIGHT 5\' 1"  5\' 1"  5\' 1"   BMI 36.47 kg/m2 37.22 kg/m2 37.98 kg/m2

## 2017-07-26 NOTE — Assessment & Plan Note (Signed)
Sub optimal though adequate control, needs to work on lifestyle change to improve thois. No additional medication

## 2017-08-05 ENCOUNTER — Telehealth: Payer: Self-pay | Admitting: Family Medicine

## 2017-08-05 DIAGNOSIS — E790 Hyperuricemia without signs of inflammatory arthritis and tophaceous disease: Secondary | ICD-10-CM

## 2017-08-05 NOTE — Telephone Encounter (Signed)
Pt needs another medicine this is to Expensive Clochicine .06mg --please call to advise

## 2017-08-05 NOTE — Telephone Encounter (Signed)
States colchicine is too expensive. Wants it changed to something else (is she supposed to be on this long term???

## 2017-08-06 NOTE — Telephone Encounter (Signed)
Need to repeat her uric acid level , if low then we may try to see if she can be off the cpolchicine and not have an attack of gout, please order a uric acid level , has not had one since 2018 and it was high then. Because of her kidney function the colchicine is preferred over the allopurinol to lower the uric acid

## 2017-08-08 NOTE — Addendum Note (Signed)
Addended by: Eual Fines on: 08/08/2017 09:40 AM   Modules accepted: Orders

## 2017-08-08 NOTE — Telephone Encounter (Signed)
Sent to [employer] lab

## 2017-08-09 DIAGNOSIS — E790 Hyperuricemia without signs of inflammatory arthritis and tophaceous disease: Secondary | ICD-10-CM | POA: Diagnosis not present

## 2017-08-28 ENCOUNTER — Ambulatory Visit (INDEPENDENT_AMBULATORY_CARE_PROVIDER_SITE_OTHER): Payer: Medicare Other

## 2017-08-28 ENCOUNTER — Telehealth: Payer: Self-pay

## 2017-08-28 DIAGNOSIS — I6523 Occlusion and stenosis of bilateral carotid arteries: Secondary | ICD-10-CM

## 2017-08-28 DIAGNOSIS — I2510 Atherosclerotic heart disease of native coronary artery without angina pectoris: Secondary | ICD-10-CM

## 2017-08-28 LAB — VAS US CAROTID
LCCADSYS: -103 cm/s
LCCAPDIAS: 0 cm/s
LEFT ECA DIAS: 0 cm/s
LEFT VERTEBRAL DIAS: -13 cm/s
LICADDIAS: -22 cm/s
LICAPDIAS: -31 cm/s
LICAPSYS: -165 cm/s
Left CCA dist dias: -21 cm/s
Left CCA prox sys: 179 cm/s
Left ICA dist sys: -126 cm/s
RCCAPSYS: -204 cm/s
RIGHT ECA DIAS: 0 cm/s
RIGHT VERTEBRAL DIAS: -15 cm/s
Right CCA prox dias: -28 cm/s
Right cca dist sys: -102 cm/s

## 2017-08-28 NOTE — Telephone Encounter (Signed)
Patient notified. Routed to PCP 

## 2017-08-28 NOTE — Telephone Encounter (Signed)
-----   Message from Merlene Laughter, LPN sent at 2/90/2111  1:56 PM EST -----   ----- Message ----- From: Satira Sark, MD Sent: 08/28/2017   1:55 PM To: Merlene Laughter, LPN, Fayrene Helper, MD  Results reviewed.  Stable findings, 40-59% bilateral ICA stenoses as before.  Follow-up study in 1 year. A copy of this test should be forwarded to Fayrene Helper, MD.

## 2017-09-10 ENCOUNTER — Other Ambulatory Visit: Payer: Self-pay | Admitting: Family Medicine

## 2017-09-24 ENCOUNTER — Ambulatory Visit: Payer: Medicare Other

## 2017-09-24 NOTE — Progress Notes (Signed)
Cardiology Office Note  Date: 09/25/2017   ID: [name], [name] 1940-12-13, MRN [id number]  PCP: [name] Helper, MD  Primary Cardiologist: [name] Lesches, MD   Chief Complaint  Patient presents with  . Coronary Artery Disease    History of Present Illness: [name] [name] is a 77 y.o. female last seen in March 2018.  She presents for a routine follow-up visit.  Since last assessment she does not report any obvious angina symptoms.  She does not have any nitroglycerin at this time.  She has declined follow-up ischemic testing.  I personally reviewed her ECG today which shows sinus bradycardia with sinus arrhythmia, anteroseptal Q waves and diffuse repolarization abnormalities.  Follow-up carotid Dopplers in February of this year showed stable 40-59% bilateral ICA stenoses.  She remains on aspirin and Crestor.  I went over her medications.  She has come off losartan HCTZ due to recall.  Otherwise tolerating her remaining cardiac medications.   I reviewed her most recent lab work per Dr. Moshe [name].  Past Medical History:  Diagnosis Date  . 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 Carotid artery disease ([place])   . CKD (chronic kidney disease), stage III ([place])   . Essential hypertension   . Glaucoma    Laser surgery 2010  . Hyperlipidemia   . Hypothyroidism   . Obesity     Past Surgical History:  Procedure Laterality Date  . COLONOSCOPY N/A 02/04/2013   Procedure: COLONOSCOPY;  Surgeon: [name] Houston, MD;  [facility];  Service: Endoscopy;  Laterality: N/A;  1015  . COLONOSCOPY N/A 12/21/2016   Procedure: COLONOSCOPY;  Surgeon: [name] Houston, MD;  Location: [facility];  Service: Endoscopy;  Laterality: N/A;  . CORONARY ARTERY BYPASS GRAFT  October 2005   Dr. Roxy [name] - LIMA to LAD, SVG to OM 2 and OM 3, SVG to PDA  . ESOPHAGOGASTRODUODENOSCOPY N/A 12/21/2016   Procedure: ESOPHAGOGASTRODUODENOSCOPY (EGD);  Surgeon:  [name] Houston, MD;  Location: [facility];  Service: Endoscopy;  Laterality: N/A;  11:10  . EYE SURGERY  2010   laser treatment to both eyes for glaucoma    Current Outpatient Medications  Medication Sig Dispense Refill  . acetaminophen (TYLENOL) 500 MG tablet Take 1,000 mg by mouth every 6 (six) hours as needed for mild pain or headache.     . albuterol (PROAIR HFA) 108 (90 Base) MCG/ACT inhaler INHALE ONE PUFF EVERY FOUR HOURS AS NEEDED. 8.5 g 3  . amLODipine (NORVASC) 5 MG tablet TAKE ONE TABLET BY MOUTH DAILY. 90 tablet 2  . aspirin 81 MG tablet Take 81 mg by mouth daily.    . cetirizine (ZYRTEC) 10 MG tablet TAKE ONE TABLET BY MOUTH DAILY. 90 tablet 1  . Cholecalciferol (VITAMIN D3) 1000 UNITS CAPS Take 1 capsule by mouth daily.    . fish oil-omega-3 fatty acids 1000 MG capsule Take 1 g by mouth daily.     . fluticasone (FLONASE) 50 MCG/ACT nasal spray USE 2 SPRAYS IN EACH NOSTRIL ONCE DAILY.  SHAKE GENTLY BEFORE USING. (Patient taking differently: USE 1 SPRAYS IN EACH NOSTRIL ONCE DAILY AS NEEDED FOR ALLERGIES.  SHAKE GENTLY BEFORE USING.) 48 g 1  . hydrALAZINE (APRESOLINE) 25 MG tablet Take 1 tablet (25 mg total) by mouth 2 (two) times daily. 180 tablet 3  . latanoprost (XALATAN) 0.005 % ophthalmic solution Place 1 drop into both eyes at bedtime. One  drop each eye at bedtime     . levothyroxine (SYNTHROID, LEVOTHROID) 75 MCG tablet TAKE ONE TABLET BY MOUTH DAILY 90 tablet 0  . metoprolol succinate (TOPROL-XL) 100 MG 24 hr tablet TAKE ONE TABLET BY MOUTH DAILY. TAKE WITH OR IMMEDIATELY FOLLOWING A MEAL. 90 tablet 1  . Multiple Vitamins-Minerals (THERATRUM COMPLETE 50 PLUS PO) Take 1 tablet by mouth daily.     Marland Kitchen omeprazole (PRILOSEC) 40 MG capsule TAKE ONE CAPSULE BY MOUTH DAILY. 90 capsule 1  . Pediatric Multivitamins-Iron (FLINTSTONES PLUS IRON) chewable tablet Chew 1 tablet by mouth 2 (two) times daily.    [name] [name] Glycol-Propyl Glycol (SYSTANE ULTRA OP) Apply 1 drop to eye as  needed (dry eyes).    . rosuvastatin (CRESTOR) 10 MG tablet TAKE ONE TABLET BY MOUTH DAILY 90 tablet 0  . spironolactone (ALDACTONE) 25 MG tablet TAKE ONE TABLET BY MOUTH DAILY. 90 tablet 1  . nitroGLYCERIN (NITROSTAT) 0.4 MG SL tablet Place 1 tablet (0.4 mg total) under the tongue every 5 (five) minutes as needed for chest pain. 25 tablet 3   No current facility-administered medications for this visit.    Allergies:  Clonidine derivatives   Social History: The patient  reports that she quit smoking about 14 years ago. Her smoking use included cigarettes. She has a 10.00 pack-year smoking history. She has never used smokeless tobacco. She reports that she does not drink alcohol or use drugs.   ROS:  Please see the history of present illness. Otherwise, complete review of systems is positive for intermittent arthritic symptoms.  All other systems are reviewed and negative.   Physical Exam: VS:  BP (!) 142/60   Pulse (!) 52   Ht 5\' 1"  (1.549 m)   Wt 197 lb (89.4 kg)   SpO2 98%   BMI 37.22 kg/m , BMI Body mass index is 37.22 kg/m.  Wt Readings from Last 3 Encounters:  09/25/17 197 lb (89.4 kg)  07/17/17 193 lb (87.5 kg)  02/25/17 197 lb (89.4 kg)    General: Patient appears comfortable at rest. HEENT: Conjunctiva and lids normal, oropharynx clear. Neck: Supple, no elevated JVP or carotid bruits, no thyromegaly. Lungs: Clear to auscultation, nonlabored breathing at rest. Cardiac: Regular rate and rhythm, no S3, 2/6 systolic murmur, no pericardial rub. Abdomen: Soft, nontender, bowel sounds present. Extremities: No pitting edema, distal pulses 2+. Skin: Warm and dry. Musculoskeletal: No kyphosis. Neuropsychiatric: Alert and oriented x3, affect grossly appropriate.  ECG: I personally reviewed the tracing from 09/14/2016 which shows sinus bradycardia with anteroseptal Q waves, increased voltage and diffuse repolarization abnormalities.  Recent Labwork: 01/23/2017: Hemoglobin 10.0;  Platelets 188 07/10/2017: ALT 18; AST 23; BUN 31; Creat 1.66; Potassium 5.0; Sodium 139; TSH 2.99     Component Value Date/Time   CHOL 160 07/10/2017 1035   TRIG 163 (H) 07/10/2017 1035   HDL 51 07/10/2017 1035   CHOLHDL 3.1 07/10/2017 1035   VLDL 19 12/04/2016 1137   Gordon 82 07/10/2017 1035    Other Studies Reviewed Today:  Cardiac catheterization 04/11/2004 (pre-CABG): Left main is normal.  Left anterior descending artery has a tubular 80% stenosis in the mid vessel just after a large first diagonal branch. In the distal LAD there is a 70% stenosis. The first diagonal itself has diffuse 70% stenosis proximally.  Left circumflex is a very tortuous artery. There is a very long 80-90% stenosis in the mid circumflex which extends across the origin of a large second obtuse marginal branch. The  circumflex gives rise to a small first obtuse marginal, large second obtuse marginal and a large third obtuse marginal branch. The second obtuse marginal has a 40% stenosis proximally.  Right coronary artery has a 90% stenosis in the proximal vessel followed by a tubular 60% stenosis in the mid vessel. In the distal right coronary artery there is a 60% stenosis which extends into the proximal portion of the posterior descending artery. The distal right coronary artery gives rise to a normal size posterior descending artery and a small posterolateral branch.  IMPRESSION: 1. Normal left ventricular systolic function. 2. Severe three-vessel coronary artery disease.  Adenosine Cardiolite 01/21/2008 [facility]): Abnormal ST segment changes observed with adenosine, fixed lateral wall defect, TID ratio 1.23, LVEF 75%.  Carotid Dopplers 08/28/2017: Stable 40-59% bilateral ICA stenoses.  Assessment and Plan:  1.  Multivessel CAD status post CABG in 2005.  I reviewed her ECG which is stable.  She continues to prefer medical therapy and observation, does not want  to proceed with follow-up ischemic testing at this point.  2.  Essential hypertension.  She is now off losartan HCTZ due to recall.  Tolerating remaining therapy including hydralazine, Norvasc, Aldactone, and Toprol-XL.  No changes were made today.  3.  Mixed hyperlipidemia, continues on Crestor.  Recent LDL 82.  4.  Moderate bilateral ICA disease, stable by Dopplers done in February.  Continue aspirin and statin.  Current medicines were reviewed with the patient today.   Orders Placed This Encounter  Procedures  . EKG 12-Lead    Disposition: Follow-up in 1 year, sooner if needed.  Signed, Satira Sark, MD, [facility] 09/25/2017 11:25 AM    [facility] [place], [place], [place] [address] Phone: [phone]; Fax: [phone]

## 2017-09-25 ENCOUNTER — Encounter: Payer: Self-pay | Admitting: Cardiology

## 2017-09-25 ENCOUNTER — Ambulatory Visit: Payer: Medicare Other | Admitting: Cardiology

## 2017-09-25 VITALS — BP 142/60 | HR 52 | Ht 61.0 in | Wt 197.0 lb

## 2017-09-25 DIAGNOSIS — E782 Mixed hyperlipidemia: Secondary | ICD-10-CM

## 2017-09-25 DIAGNOSIS — I10 Essential (primary) hypertension: Secondary | ICD-10-CM

## 2017-09-25 DIAGNOSIS — I2510 Atherosclerotic heart disease of native coronary artery without angina pectoris: Secondary | ICD-10-CM | POA: Diagnosis not present

## 2017-09-25 DIAGNOSIS — I6523 Occlusion and stenosis of bilateral carotid arteries: Secondary | ICD-10-CM | POA: Diagnosis not present

## 2017-09-25 MED ORDER — NITROGLYCERIN 0.4 MG SL SUBL: 0.4000 mg | SUBLINGUAL_TABLET | SUBLINGUAL | 3 refills | Status: DC | PRN

## 2017-09-25 NOTE — Patient Instructions (Signed)

## 2017-09-27 ENCOUNTER — Other Ambulatory Visit: Payer: Self-pay | Admitting: Family Medicine

## 2017-10-01 ENCOUNTER — Other Ambulatory Visit: Payer: Self-pay | Admitting: Family Medicine

## 2017-10-25 ENCOUNTER — Ambulatory Visit: Payer: Medicare Other

## 2017-10-28 ENCOUNTER — Ambulatory Visit: Payer: Medicare Other

## 2017-10-30 ENCOUNTER — Other Ambulatory Visit: Payer: Self-pay | Admitting: Cardiology

## 2017-10-31 ENCOUNTER — Other Ambulatory Visit: Payer: Self-pay | Admitting: Family Medicine

## 2017-10-31 DIAGNOSIS — Z1231 Encounter for screening mammogram for malignant neoplasm of breast: Secondary | ICD-10-CM

## 2017-11-08 ENCOUNTER — Ambulatory Visit (INDEPENDENT_AMBULATORY_CARE_PROVIDER_SITE_OTHER): Payer: Medicare Other

## 2017-11-08 VITALS — BP 142/74 | HR 56 | Resp 16 | Ht 61.0 in | Wt 193.0 lb

## 2017-11-08 DIAGNOSIS — Z0000 Encounter for general adult medical examination without abnormal findings: Secondary | ICD-10-CM

## 2017-11-08 MED ORDER — WALL GRAB BAR MISC: 0 refills | Status: DC

## 2017-11-08 NOTE — Patient Instructions (Addendum)
[name] [name] , Thank you for taking time to come for your Medicare Wellness Visit. I appreciate your ongoing commitment to your health goals. Please review the following plan we discussed and let me know if I can assist you in the future.   Screening recommendations/referrals: Colonoscopy: Due 2028 Mammogram: scheduled for 11/2017 Bone Density: Done Recommended yearly ophthalmology/optometry visit for glaucoma screening and checkup Recommended yearly dental visit for hygiene and checkup  Vaccinations: Influenza vaccine: Due August 2019 Pneumococcal vaccine: Done Tdap vaccine: Due  Shingles vaccine: Due  Advanced directives: Done  Conditions/risks identified: Done   Next appointment: To be scheduled    Preventive Care 7 Years and Older, Female Preventive care refers to lifestyle choices and visits with your health care provider that can promote health and wellness. What does preventive care include?  A yearly physical exam. This is also called an annual well check.  Dental exams once or twice a year.  Routine eye exams. Ask your health care provider how often you should have your eyes checked.  Personal lifestyle choices, including:  Daily care of your teeth and gums.  Regular physical activity.  Eating a healthy diet.  Avoiding tobacco and drug use.  Limiting alcohol use.  Practicing safe sex.  Taking low-dose aspirin every day.  Taking vitamin and mineral supplements as recommended by your health care provider. What happens during an annual well check? The services and screenings done by your health care provider during your annual well check will depend on your age, overall health, lifestyle risk factors, and family history of disease. Counseling  Your health care provider may ask you questions about your:  Alcohol use.  Tobacco use.  Drug use.  Emotional well-being.  Home and relationship well-being.  Sexual activity.  Eating habits.  History of  falls.  Memory and ability to understand (cognition).  Work and work Statistician.  Reproductive health. Screening  You may have the following tests or measurements:  Height, weight, and BMI.  Blood pressure.  Lipid and cholesterol levels. These may be checked every 5 years, or more frequently if you are over 49 years old.  Skin check.  Lung cancer screening. You may have this screening every year starting at age 71 if you have a 30-pack-year history of smoking and currently smoke or have quit within the past 15 years.  Fecal occult blood test (FOBT) of the stool. You may have this test every year starting at age 70.  Flexible sigmoidoscopy or colonoscopy. You may have a sigmoidoscopy every 5 years or a colonoscopy every 10 years starting at age 68.  Hepatitis C blood test.  Hepatitis B blood test.  Sexually transmitted disease (STD) testing.  Diabetes screening. This is done by checking your blood sugar (glucose) after you have not eaten for a while (fasting). You may have this done every 1-3 years.  Bone density scan. This is done to screen for osteoporosis. You may have this done starting at age 37.  Mammogram. This may be done every 1-2 years. Talk to your health care provider about how often you should have regular mammograms. Talk with your health care provider about your test results, treatment options, and if necessary, the need for more tests. Vaccines  Your health care provider may recommend certain vaccines, such as:  Influenza vaccine. This is recommended every year.  Tetanus, diphtheria, and acellular pertussis (Tdap, Td) vaccine. You may need a Td booster every 10 years.  Zoster vaccine. You may need this after age  60.  Pneumococcal 13-valent conjugate (PCV13) vaccine. One dose is recommended after age 15.  Pneumococcal polysaccharide (PPSV23) vaccine. One dose is recommended after age 61. Talk to your health care provider about which screenings and  vaccines you need and how often you need them. This information is not intended to replace advice given to you by your health care provider. Make sure you discuss any questions you have with your health care provider. Document Released: 07/15/2015 Document Revised: 03/07/2016 Document Reviewed: 04/19/2015 Elsevier Interactive Patient Education  2017 [place] Prevention in the Home Falls can cause injuries. They can happen to people of all ages. There are many things you can do to make your home safe and to help prevent falls. What can I do on the outside of my home?  Regularly fix the edges of walkways and driveways and fix any cracks.  Remove anything that might make you trip as you walk through a door, such as a raised step or threshold.  Trim any bushes or trees on the path to your home.  Use bright outdoor lighting.  Clear any walking paths of anything that might make someone trip, such as rocks or tools.  Regularly check to see if handrails are loose or broken. Make sure that both sides of any steps have handrails.  Any raised decks and porches should have guardrails on the edges.  Have any leaves, snow, or ice cleared regularly.  Use sand or salt on walking paths during winter.  Clean up any spills in your garage right away. This includes oil or grease spills. What can I do in the bathroom?  Use night lights.  Install grab bars by the toilet and in the tub and shower. Do not use towel bars as grab bars.  Use non-skid mats or decals in the tub or shower.  If you need to sit down in the shower, use a plastic, non-slip stool.  Keep the floor dry. Clean up any water that spills on the floor as soon as it happens.  Remove soap buildup in the tub or shower regularly.  Attach bath mats securely with double-sided non-slip rug tape.  Do not have throw rugs and other things on the floor that can make you trip. What can I do in the bedroom?  Use night  lights.  Make sure that you have a light by your bed that is easy to reach.  Do not use any sheets or blankets that are too big for your bed. They should not hang down onto the floor.  Have a firm chair that has side arms. You can use this for support while you get dressed.  Do not have throw rugs and other things on the floor that can make you trip. What can I do in the kitchen?  Clean up any spills right away.  Avoid walking on wet floors.  Keep items that you use a lot in easy-to-reach places.  If you need to reach something above you, use a strong step stool that has a grab bar.  Keep electrical cords out of the way.  Do not use floor polish or wax that makes floors slippery. If you must use wax, use non-skid floor wax.  Do not have throw rugs and other things on the floor that can make you trip. What can I do with my stairs?  Do not leave any items on the stairs.  Make sure that there are handrails on both sides of the stairs and  use them. Fix handrails that are broken or loose. Make sure that handrails are as long as the stairways.  Check any carpeting to make sure that it is firmly attached to the stairs. Fix any carpet that is loose or worn.  Avoid having throw rugs at the top or bottom of the stairs. If you do have throw rugs, attach them to the floor with carpet tape.  Make sure that you have a light switch at the top of the stairs and the bottom of the stairs. If you do not have them, ask someone to add them for you. What else can I do to help prevent falls?  Wear shoes that:  Do not have high heels.  Have rubber bottoms.  Are comfortable and fit you well.  Are closed at the toe. Do not wear sandals.  If you use a stepladder:  Make sure that it is fully opened. Do not climb a closed stepladder.  Make sure that both sides of the stepladder are locked into place.  Ask someone to hold it for you, if possible.  Clearly mark and make sure that you can  see:  Any grab bars or handrails.  First and last steps.  Where the edge of each step is.  Use tools that help you move around (mobility aids) if they are needed. These include:  Canes.  Walkers.  Scooters.  Crutches.  Turn on the lights when you go into a dark area. Replace any light bulbs as soon as they burn out.  Set up your furniture so you have a clear path. Avoid moving your furniture around.  If any of your floors are uneven, fix them.  If there are any pets around you, be aware of where they are.  Review your medicines with your doctor. Some medicines can make you feel dizzy. This can increase your chance of falling. Ask your doctor what other things that you can do to help prevent falls. This information is not intended to replace advice given to you by your health care provider. Make sure you discuss any questions you have with your health care provider. Document Released: 04/14/2009 Document Revised: 11/24/2015 Document Reviewed: 07/23/2014 Elsevier Interactive Patient Education  2017 [employer].

## 2017-11-08 NOTE — Progress Notes (Signed)
Subjective:   [name] [name] is a 77 y.o. female who presents for Medicare Annual (Subsequent) preventive examination.  Review of Systems:   Cardiac Risk Factors include: advanced age (>14men, >50 women);hypertension;smoking/ tobacco exposure     Objective:     Vitals: BP (!) 142/74   Pulse (!) 56   Resp 16   Ht 5\' 1"  (1.549 m)   Wt 193 lb (87.5 kg)   SpO2 95%   BMI 36.47 kg/m   Body mass index is 36.47 kg/m.  Advanced Directives 12/21/2016 09/20/2016 12/07/2014 02/04/2013  Does Patient Have a Medical Advance Directive? No No No Patient does not have advance directive;Patient would not like information  Would patient like information on creating a medical advance directive? No - Patient declined Yes (MAU/Ambulatory/Procedural Areas - Information given) No - patient declined information -  Pre-existing out of facility DNR order (yellow form or pink MOST form) - - - No    Tobacco Social History   Tobacco Use  Smoking Status Former Smoker  . Packs/day: 0.25  . Years: 40.00  . Pack years: 10.00  . Types: Cigarettes  . Last attempt to quit: 07/03/2003  . Years since quitting: 14.3  Smokeless Tobacco Never Used     Counseling given: Not Answered   Clinical Intake:     Pain : 0-10 Pain Score: 3  Pain Type: Chronic pain Pain Location: Hip     Nutritional Status: BMI > 30  Obese Diabetes: No  How often do you need to have someone help you when you read instructions, pamphlets, or other written materials from your doctor or pharmacy?: 3 - Sometimes What is the last grade level you completed in school?: college   Interpreter Needed?: No  Information entered by :: Loys Hoselton LPN   Past Medical History:  Diagnosis Date  . 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 Carotid artery disease ([place])   . CKD (chronic kidney disease), stage III ([place])   . Essential hypertension   . Glaucoma    Laser surgery 2010  . Hyperlipidemia   .  Hypothyroidism   . Obesity    Past Surgical History:  Procedure Laterality Date  . COLONOSCOPY N/A 02/04/2013   Procedure: COLONOSCOPY;  Surgeon: Rogene Houston, MD;  [facility];  Service: Endoscopy;  Laterality: N/A;  1015  . COLONOSCOPY N/A 12/21/2016   Procedure: COLONOSCOPY;  Surgeon: Rogene Houston, MD;  [facility];  Service: Endoscopy;  Laterality: N/A;  . CORONARY ARTERY BYPASS GRAFT  October 2005   Dr. Roxy Manns - LIMA to LAD, SVG to OM 2 and OM 3, SVG to PDA  . ESOPHAGOGASTRODUODENOSCOPY N/A 12/21/2016   Procedure: ESOPHAGOGASTRODUODENOSCOPY (EGD);  Surgeon: Rogene Houston, MD;  [facility];  Service: Endoscopy;  Laterality: N/A;  11:10  . EYE SURGERY  2010   laser treatment to both eyes for glaucoma   Family History  Problem Relation Age of Onset  . Aneurysm Mother   . Hypertension Mother   . Coronary artery disease Mother   . Coronary artery disease Father   . Diabetes Son   . Pancreatic cancer Son   . Colon cancer Brother 39       lived 18 months afterwards  . Liver cancer Brother 36  . Diabetes Sister   . Coronary artery disease Sister   . Diverticulosis Sister   . COPD Sister   . Kidney  disease Sister   . Diabetes Sister   . Hypertension Sister   . Breast cancer Sister 52  . Hearing loss Sister   . Heart Problems Sister        pacemaker   Social History   Socioeconomic History  . Marital status: Married    Spouse name: Not on file  . Number of children: 1  . Years of education: Not on file  . Highest education level: Not on file  Occupational History  . Occupation: retired   Scientific laboratory technician  . Financial resource strain: Not very hard  . Food insecurity:    Worry: Never true    Inability: Never true  . Transportation needs:    Medical: Yes    Non-medical: Yes  Tobacco Use  . Smoking status: Former Smoker    Packs/day: 0.25    Years: 40.00    Pack years: 10.00    Types: Cigarettes    Last attempt to quit:  07/03/2003    Years since quitting: 14.3  . Smokeless tobacco: Never Used  Substance and Sexual Activity  . Alcohol use: No    Alcohol/week: 0.0 oz  . Drug use: No  . Sexual activity: Never  Lifestyle  . Physical activity:    Days per week: 0 days    Minutes per session: 0 min  . Stress: To some extent  Relationships  . Social connections:    Talks on phone: Once a week    Gets together: Once a week    Attends religious service: More than 4 times per year    Active member of club or organization: Not on file    Attends meetings of clubs or organizations: More than 4 times per year    Relationship status: Married  Other Topics Concern  . Not on file  Social History Narrative  . Not on file    Outpatient Encounter Medications as of 11/08/2017  Medication Sig  . acetaminophen (TYLENOL) 500 MG tablet Take 1,000 mg by mouth every 6 (six) hours as needed for mild pain or headache.   . albuterol (PROAIR HFA) 108 (90 Base) MCG/ACT inhaler INHALE ONE PUFF EVERY FOUR HOURS AS NEEDED.  Marland Kitchen amLODipine (NORVASC) 5 MG tablet TAKE ONE TABLET BY MOUTH DAILY.  Marland Kitchen aspirin 81 MG tablet Take 81 mg by mouth daily.  . cetirizine (ZYRTEC) 10 MG tablet TAKE ONE TABLET BY MOUTH DAILY.  Marland Kitchen Cholecalciferol (VITAMIN D3) 1000 UNITS CAPS Take 1 capsule by mouth daily.  . fish oil-omega-3 fatty acids 1000 MG capsule Take 1 g by mouth daily.   . fluticasone (FLONASE) 50 MCG/ACT nasal spray USE 2 SPRAYS IN EACH NOSTRIL ONCE DAILY.  SHAKE GENTLY BEFORE USING. (Patient taking differently: USE 1 SPRAYS IN EACH NOSTRIL ONCE DAILY AS NEEDED FOR ALLERGIES.  SHAKE GENTLY BEFORE USING.)  . hydrALAZINE (APRESOLINE) 25 MG tablet TAKE ONE TABLET BY MOUTH TWICE DAILY.  Marland Kitchen latanoprost (XALATAN) 0.005 % ophthalmic solution Place 1 drop into both eyes at bedtime. One drop each eye at bedtime   . levothyroxine (SYNTHROID, LEVOTHROID) 75 MCG tablet TAKE ONE TABLET BY MOUTH DAILY  . metoprolol succinate (TOPROL-XL) 100 MG 24 hr tablet  TAKE ONE TABLET BY MOUTH DAILY. TAKE WITH OR IMMEDIATELY FOLLOWING A MEAL.  . Multiple Vitamins-Minerals (THERATRUM COMPLETE 50 PLUS PO) Take 1 tablet by mouth daily.   . nitroGLYCERIN (NITROSTAT) 0.4 MG SL tablet Place 1 tablet (0.4 mg total) under the tongue every 5 (five) minutes as needed for  chest pain.  Marland Kitchen omeprazole (PRILOSEC) 40 MG capsule TAKE ONE CAPSULE BY MOUTH DAILY.  Marland Kitchen Pediatric Multivitamins-Iron (FLINTSTONES PLUS IRON) chewable tablet Chew 1 tablet by mouth 2 (two) times daily.  Vladimir Faster Glycol-Propyl Glycol (SYSTANE ULTRA OP) Apply 1 drop to eye as needed (dry eyes).  . rosuvastatin (CRESTOR) 10 MG tablet TAKE ONE TABLET BY MOUTH DAILY  . spironolactone (ALDACTONE) 25 MG tablet TAKE ONE TABLET BY MOUTH DAILY.  . Misc. Devices (WALL GRAB BAR) MISC Grab bar for shower x 2 DX high fall risk   No facility-administered encounter medications on file as of 11/08/2017.     Activities of Daily Living In your present state of health, do you have any difficulty performing the following activities: 11/08/2017 11/08/2017  Hearing? N N  Vision? N N  Difficulty concentrating or making decisions? N N  Walking or climbing stairs? Y N  Dressing or bathing? N N  Doing errands, shopping? N N  Preparing Food and eating ? N N  Using the Toilet? N N  In the past six months, have you accidently leaked urine? N N  Do you have problems with loss of bowel control? N N  Managing your Medications? N N  Managing your Finances? N N  Housekeeping or managing your Housekeeping? N N  Some recent data might be hidden    Patient Care Team: Fayrene Helper, MD as PCP - General Carlena Bjornstad, MD as Consulting Physician (Cardiology) Warden Fillers, MD as Consulting Physician (Optometry) Steffanie Rainwater, DPM as Consulting Physician (Podiatry)    Assessment:   This is a routine wellness examination for [name] [name].  Exercise Activities and Dietary recommendations Current Exercise Habits: Home  exercise routine, Type of exercise: walking, Time (Minutes): 20, Frequency (Times/Week): 5, Weekly Exercise (Minutes/Week): 100, Intensity: Mild  Goals    . Exercise 3x per week (30 min per time)     Recommend chair exercises 3 times a week for 30-45 minutes at a time as tolerated.       Fall Risk Fall Risk  11/08/2017 12/04/2016 09/20/2016 08/23/2016 01/24/2015  Falls in the past year? No No No No No   Is the patient's home free of loose throw rugs in walkways, pet beds, electrical cords, etc?   yes      Grab bars in the bathroom? yes- rx given       Handrails on the stairs?   yes      Adequate lighting?   yes  Timed Get Up and Go performed:   Depression Screen PHQ 2/9 Scores 11/08/2017 12/04/2016 09/20/2016 08/23/2016  PHQ - 2 Score 0 0 1 0  PHQ- 9 Score - - - -     Cognitive Function     6CIT Screen 11/08/2017 09/20/2016  What Year? 0 points 0 points  What month? 0 points 0 points  What time? 0 points 0 points  Count back from 20 0 points 0 points  Months in reverse 0 points 0 points  Repeat phrase 0 points 0 points  Total Score 0 0    Immunization History  Administered Date(s) Administered  . Influenza Split 03/11/2012  . Influenza,inj,Quad PF,6+ Mos 04/02/2013, 05/17/2014, 06/29/2015, 06/27/2016, 02/25/2017  . Pneumococcal Conjugate-13 01/04/2014  . Pneumococcal Polysaccharide-23 09/25/2007  . Td 09/25/2007  . Zoster 03/12/2012    Qualifies for Shingles Vaccine? yes  Screening Tests Health Maintenance  Topic Date Due  . TETANUS/TDAP  09/24/2017  . INFLUENZA VACCINE  01/30/2018  . COLONOSCOPY  12/22/2026  . DEXA SCAN  Completed  . PNA vac Low Risk Adult  Completed    Cancer Screenings: Lung: Low Dose CT Chest recommended if Age 33-80 years, 30 pack-year currently smoking OR have quit w/in 15years. Patient does not qualify. Breast:  Up to date on Mammogram? Yes   Up to date of Bone Density/Dexa? Yes Colorectal: due 2028  Additional Screenings:  Hepatitis C  Screening:      Plan:      I have personally reviewed and noted the following in the patient's chart:   . Medical and social history . Use of alcohol, tobacco or illicit drugs  . Current medications and supplements . Functional ability and status . Nutritional status . Physical activity . Advanced directives . List of other physicians . Hospitalizations, surgeries, and ER visits in previous 12 months . Vitals . Screenings to include cognitive, depression, and falls . Referrals and appointments  In addition, I have reviewed and discussed with patient certain preventive protocols, quality metrics, and best practice recommendations. A written personalized care plan for preventive services as well as general preventive health recommendations were provided to patient.     Kate Sable, LPN, LPN  01/24/2034

## 2017-11-18 ENCOUNTER — Telehealth: Payer: Self-pay | Admitting: Family Medicine

## 2017-11-18 ENCOUNTER — Other Ambulatory Visit: Payer: Self-pay | Admitting: Family Medicine

## 2017-11-18 NOTE — Telephone Encounter (Signed)
Pt foot is swollen and she thinks she needs the medication you have given her in the past for Gout. Please call it into [facility].

## 2017-11-18 NOTE — Telephone Encounter (Signed)
Right foot pain and swelling with tenderness and wants the lmed you gave her in the past for it. Please advise - [facility]

## 2017-11-19 ENCOUNTER — Other Ambulatory Visit: Payer: Self-pay | Admitting: Family Medicine

## 2017-11-19 MED ORDER — PREDNISONE 5 MG PO TABS: ORAL_TABLET | ORAL | 0 refills | Status: DC

## 2017-11-19 MED ORDER — COLCHICINE 0.6 MG PO TABS: ORAL_TABLET | ORAL | 0 refills | Status: DC

## 2017-11-19 NOTE — Telephone Encounter (Signed)
I spoke directly to [name] [name], she is awrae that both prednisone and colchicine have been sent directly to her pharmacy by me today for acute gout. I advised her to call in for an  appt if no better, sge agreed

## 2017-11-19 NOTE — Telephone Encounter (Signed)
Patients spouse called in upset because his wife is in a lot of pain and her foot is hot to the touch. He states she needs medication right now. Please advise.

## 2017-12-11 ENCOUNTER — Other Ambulatory Visit: Payer: Self-pay | Admitting: Family Medicine

## 2017-12-12 ENCOUNTER — Encounter (HOSPITAL_COMMUNITY): Payer: Self-pay

## 2017-12-12 ENCOUNTER — Ambulatory Visit (HOSPITAL_COMMUNITY)
Admission: RE | Admit: 2017-12-12 | Discharge: 2017-12-12 | Disposition: A | Discharge status: Home / Self Care | Payer: Medicare Other | Source: Ambulatory Visit | Attending: Family Medicine | Admitting: Family Medicine

## 2017-12-12 DIAGNOSIS — Z1231 Encounter for screening mammogram for malignant neoplasm of breast: Secondary | ICD-10-CM

## 2017-12-19 ENCOUNTER — Other Ambulatory Visit: Payer: Self-pay | Admitting: Family Medicine

## 2018-01-07 ENCOUNTER — Other Ambulatory Visit: Payer: Self-pay | Admitting: Family Medicine

## 2018-01-16 ENCOUNTER — Other Ambulatory Visit: Payer: Self-pay | Admitting: Family Medicine

## 2018-01-20 ENCOUNTER — Telehealth: Payer: Self-pay | Admitting: Family Medicine

## 2018-01-20 NOTE — Telephone Encounter (Signed)
Pls print the standard work excuse for jury duty, I will sign and let her know so she can have it mailed to her / she collect it

## 2018-01-20 NOTE — Telephone Encounter (Signed)
Patient is requesting a letter dismissing her from jury duty Cb# [phone]/ [phone]  **she made an appointment for tomorrow morning because she hears and feels a heart beat in her temples.

## 2018-01-21 ENCOUNTER — Ambulatory Visit: Payer: Medicare Other | Admitting: Family Medicine

## 2018-01-21 NOTE — Telephone Encounter (Signed)
Letter typed and printed for patient. Patient notified she can pick up the letter tomorrow when she comes in for her appointment. She verbalized understanding.

## 2018-01-22 ENCOUNTER — Encounter: Payer: Self-pay | Admitting: Family Medicine

## 2018-01-22 ENCOUNTER — Ambulatory Visit (INDEPENDENT_AMBULATORY_CARE_PROVIDER_SITE_OTHER): Payer: Medicare Other | Admitting: Family Medicine

## 2018-01-22 VITALS — BP 132/68 | HR 58 | Resp 12 | Ht 61.0 in | Wt 191.0 lb

## 2018-01-22 DIAGNOSIS — I10 Essential (primary) hypertension: Secondary | ICD-10-CM

## 2018-01-22 DIAGNOSIS — E790 Hyperuricemia without signs of inflammatory arthritis and tophaceous disease: Secondary | ICD-10-CM

## 2018-01-22 DIAGNOSIS — I2510 Atherosclerotic heart disease of native coronary artery without angina pectoris: Secondary | ICD-10-CM | POA: Diagnosis not present

## 2018-01-22 DIAGNOSIS — M5136 Other intervertebral disc degeneration, lumbar region: Secondary | ICD-10-CM | POA: Diagnosis not present

## 2018-01-22 DIAGNOSIS — M4316 Spondylolisthesis, lumbar region: Secondary | ICD-10-CM

## 2018-01-22 DIAGNOSIS — E785 Hyperlipidemia, unspecified: Secondary | ICD-10-CM

## 2018-01-22 DIAGNOSIS — I6523 Occlusion and stenosis of bilateral carotid arteries: Secondary | ICD-10-CM

## 2018-01-22 NOTE — Patient Instructions (Addendum)
Keep physical as before , call if you need me sooner  Please don't be too shy to use your cane  Please collect your letter for jury duty excuse along with lab order for CBC, fasting lipid, cmp and EGFr, tSH, uric acid level, Vit D, and hBA1C to collect 1 week before Septemeber visit  Use tylenol arthritis and aspercreme for back and hip pain  Blockage in neck needs cholesterol to be low and take aspriin every day  For sleep, good sleep habits, not medicartion

## 2018-01-30 ENCOUNTER — Other Ambulatory Visit: Payer: Self-pay | Admitting: Cardiology

## 2018-02-01 ENCOUNTER — Encounter: Payer: Self-pay | Admitting: Family Medicine

## 2018-02-01 DIAGNOSIS — I6529 Occlusion and stenosis of unspecified carotid artery: Secondary | ICD-10-CM | POA: Insufficient documentation

## 2018-02-01 DIAGNOSIS — M4316 Spondylolisthesis, lumbar region: Secondary | ICD-10-CM | POA: Insufficient documentation

## 2018-02-01 DIAGNOSIS — M5136 Other intervertebral disc degeneration, lumbar region: Secondary | ICD-10-CM

## 2018-02-01 NOTE — Assessment & Plan Note (Signed)
Poor exercise tolerance and increased fatigue noted at times

## 2018-02-01 NOTE — Assessment & Plan Note (Signed)
Improved. Patient re-educated about  the importance of commitment to a  minimum of 150 minutes of exercise per week.  The importance of healthy food choices with portion control discussed. Encouraged to start a food diary, count calories and to consider  joining a support group. Sample diet sheets offered. Goals set by the patient for the next several months.   Weight /BMI 01/22/2018 11/08/2017 09/25/2017  WEIGHT 191 lb 193 lb 197 lb  HEIGHT 5\' 1"  5\' 1"  5\' 1"   BMI 36.09 kg/m2 36.47 kg/m2 37.22 kg/m2

## 2018-02-01 NOTE — Progress Notes (Signed)
[name] [name]     MRN: [id number]      DOB: [birth year]   HPI [name] [name] is here for follow up and re-evaluation of chronic medical conditions, medication management and review of any available recent lab and radiology data.  Wants to review recent carotid artery [place] report and the implication Preventive health is updated, specifically  Cancer screening and Immunization.   Questions or concerns regarding consultations or procedures which the PT has had in the interim are  addressed. The PT denies any adverse reactions to current medications since the last visit.  C/o in increased back and hip pain which she finds is increasingly limiting her mobility , she has to stop and rest more due to pain however states she does not use her cane for ambulation as she knows that she should , feels too proud most of the time, sometimes feels as though she may lose her balance and fall Is concerned because recently called for jury duty and feels her health is such that she is unable. Apart from severe arthritis in her spine she has established CAD and has advanced age 77 poor sleep and wants to know about medication, sleep hygiene is poor however , so I explained that is the way to begins and end at her age 77 and she is happy to hear this,  ROS Denies recent fever or chills. Denies sinus pressure, nasal congestion, ear pain or sore throat. Denies chest congestion, productive cough or wheezing. C/o intermittent chest pain and shortness of breath with activity and poor exercise tolerance Denies abdominal pain, nausea, vomiting,diarrhea or constipation.   Denies dysuria, frequency, hesitancy or incontinence. .c/o back and hip pain  Denies headaches, seizures,c/o lowe extremity  numbness, and  tingling. Denies depression,  C/o mild anxiety and  insomnia. Denies skin break down or rash.   PE  BP 132/68 (BP Location: Left Arm, Patient Position: Sitting, Cuff Size: Large)   Pulse (!) 58   Resp 12   Ht 5'  1" (1.549 m)   Wt 191 lb (86.6 kg)   SpO2 97%   BMI 36.09 kg/m   Patient alert and oriented and in no cardiopulmonary distress.  HEENT: No facial asymmetry, EOMI,   oropharynx pink and moist.  Neck decreased ROM  no JVD, no mass.Bruit   Chest: Clear to auscultation bilaterally.  CVS: S1, S2 no murmurs, no S3.Regular rate.  ABD: Soft non tender.   Ext: No edema  MS: Decreased ROM spine, shoulders, hips and knees.  Skin: Intact, no ulcerations or rash noted.  Psych: Good eye contact, normal affect. Memory mildly impaired , mildly  anxious not  depressed appearing.  CNS: CN 2-12 intact, power,  normal throughout.no focal deficits noted.   Assessment & Plan  Essential hypertension, malignant Controlled, no change in medication   CAD (coronary artery disease) Poor exercise tolerance and increased fatigue noted at times  Morbid obesity Improved. Patient re-educated about  the importance of commitment to a  minimum of 150 minutes of exercise per week.  The importance of healthy food choices with portion control discussed. Encouraged to start a food diary, count calories and to consider  joining a support group. Sample diet sheets offered. Goals set by the patient for the next several months.   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

## 2018-02-01 NOTE — Assessment & Plan Note (Addendum)
repeat test in 08/2017 unchanged since prior study, followed by cardiology reported as less than 50 % and is bilateral, explained no surgery needed, she  Is to keep lipids low ,  and and also to continue aspirin

## 2018-02-01 NOTE — Assessment & Plan Note (Addendum)
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

## 2018-02-01 NOTE — Assessment & Plan Note (Signed)
Controlled, no change in medication  

## 2018-02-25 DIAGNOSIS — E785 Hyperlipidemia, unspecified: Secondary | ICD-10-CM | POA: Diagnosis not present

## 2018-02-25 DIAGNOSIS — I10 Essential (primary) hypertension: Secondary | ICD-10-CM | POA: Diagnosis not present

## 2018-02-25 DIAGNOSIS — E790 Hyperuricemia without signs of inflammatory arthritis and tophaceous disease: Secondary | ICD-10-CM | POA: Diagnosis not present

## 2018-02-26 ENCOUNTER — Other Ambulatory Visit: Payer: Self-pay

## 2018-02-26 ENCOUNTER — Observation Stay (HOSPITAL_COMMUNITY)
Admission: EM | Admit: 2018-02-26 | Discharge: 2018-02-27 | Disposition: A | Discharge status: Home / Self Care | Payer: Medicare Other | Attending: Internal Medicine | Admitting: Internal Medicine

## 2018-02-26 ENCOUNTER — Encounter (HOSPITAL_COMMUNITY): Payer: Self-pay

## 2018-02-26 ENCOUNTER — Telehealth: Payer: Self-pay | Admitting: Family Medicine

## 2018-02-26 DIAGNOSIS — I2510 Atherosclerotic heart disease of native coronary artery without angina pectoris: Secondary | ICD-10-CM | POA: Insufficient documentation

## 2018-02-26 DIAGNOSIS — I129 Hypertensive chronic kidney disease with stage 1 through stage 4 chronic kidney disease, or unspecified chronic kidney disease: Secondary | ICD-10-CM | POA: Diagnosis not present

## 2018-02-26 DIAGNOSIS — D500 Iron deficiency anemia secondary to blood loss (chronic): Secondary | ICD-10-CM | POA: Diagnosis not present

## 2018-02-26 DIAGNOSIS — E039 Hypothyroidism, unspecified: Secondary | ICD-10-CM | POA: Diagnosis not present

## 2018-02-26 DIAGNOSIS — D649 Anemia, unspecified: Secondary | ICD-10-CM | POA: Diagnosis present

## 2018-02-26 DIAGNOSIS — I10 Essential (primary) hypertension: Secondary | ICD-10-CM | POA: Diagnosis not present

## 2018-02-26 DIAGNOSIS — N183 Chronic kidney disease, stage 3 unspecified: Secondary | ICD-10-CM

## 2018-02-26 DIAGNOSIS — K922 Gastrointestinal hemorrhage, unspecified: Secondary | ICD-10-CM

## 2018-02-26 DIAGNOSIS — Z7951 Long term (current) use of inhaled steroids: Secondary | ICD-10-CM | POA: Insufficient documentation

## 2018-02-26 DIAGNOSIS — R7302 Impaired glucose tolerance (oral): Secondary | ICD-10-CM | POA: Diagnosis not present

## 2018-02-26 DIAGNOSIS — E785 Hyperlipidemia, unspecified: Secondary | ICD-10-CM | POA: Diagnosis present

## 2018-02-26 DIAGNOSIS — Z7982 Long term (current) use of aspirin: Secondary | ICD-10-CM | POA: Insufficient documentation

## 2018-02-26 DIAGNOSIS — Z951 Presence of aortocoronary bypass graft: Secondary | ICD-10-CM | POA: Diagnosis not present

## 2018-02-26 DIAGNOSIS — Z87891 Personal history of nicotine dependence: Secondary | ICD-10-CM | POA: Diagnosis not present

## 2018-02-26 DIAGNOSIS — Z79890 Hormone replacement therapy (postmenopausal): Secondary | ICD-10-CM | POA: Diagnosis not present

## 2018-02-26 DIAGNOSIS — Z79899 Other long term (current) drug therapy: Secondary | ICD-10-CM | POA: Insufficient documentation

## 2018-02-26 HISTORY — DX: Iron deficiency anemia, unspecified: D50.9

## 2018-02-26 LAB — BASIC METABOLIC PANEL
Anion gap: 9 (ref 5–15)
BUN: 33 mg/dL — AB (ref 8–23)
CHLORIDE: 108 mmol/L (ref 98–111)
CO2: 24 mmol/L (ref 22–32)
Calcium: 10.7 mg/dL — ABNORMAL HIGH (ref 8.9–10.3)
Creatinine, Ser: 1.92 mg/dL — ABNORMAL HIGH (ref 0.44–1.00)
GFR calc Af Amer: 28 mL/min — ABNORMAL LOW (ref >60)
GFR, EST NON AFRICAN AMERICAN: 24 mL/min — AB (ref >60)
GLUCOSE: 102 mg/dL — AB (ref 70–99)
POTASSIUM: 5 mmol/L (ref 3.5–5.1)
Sodium: 141 mmol/L (ref 135–145)

## 2018-02-26 LAB — ABO/RH: ABO/RH(D): A POS

## 2018-02-26 LAB — RETICULOCYTES
RBC.: 2.76 MIL/uL — ABNORMAL LOW (ref 3.87–5.11)
RETIC CT PCT: 3.4 % — AB (ref 0.4–3.1)
Retic Count, Absolute: 93.8 10*3/uL (ref 19.0–186.0)

## 2018-02-26 LAB — CBC WITH DIFFERENTIAL/PLATELET
BASOS PCT: 1 %
Basophils Absolute: 0.1 10*3/uL (ref 0.0–0.1)
EOS PCT: 5 %
Eosinophils Absolute: 0.3 10*3/uL (ref 0.0–0.7)
HEMATOCRIT: 22.3 % — AB (ref 36.0–46.0)
Hemoglobin: 6.7 g/dL — CL (ref 12.0–15.0)
Lymphocytes Relative: 39 %
Lymphs Abs: 2.1 10*3/uL (ref 0.7–4.0)
MCH: 24.3 pg — AB (ref 26.0–34.0)
MCHC: 30 g/dL (ref 30.0–36.0)
MCV: 80.8 fL (ref 78.0–100.0)
MONO ABS: 0.3 10*3/uL (ref 0.1–1.0)
MONOS PCT: 6 %
NEUTROS PCT: 49 %
Neutro Abs: 2.6 10*3/uL (ref 1.7–7.7)
PLATELETS: 216 10*3/uL (ref 150–400)
RBC: 2.76 MIL/uL — ABNORMAL LOW (ref 3.87–5.11)
RDW: 16.5 % — ABNORMAL HIGH (ref 11.5–15.5)
WBC: 5.4 10*3/uL (ref 4.0–10.5)

## 2018-02-26 LAB — FERRITIN: FERRITIN: 8 ng/mL — AB (ref 11–307)

## 2018-02-26 LAB — IRON AND TIBC
IRON: 20 ug/dL — AB (ref 28–170)
SATURATION RATIOS: 4 % — AB (ref 10.4–31.8)
TIBC: 513 ug/dL — AB (ref 250–450)
UIBC: 493 ug/dL

## 2018-02-26 LAB — FOLATE: Folate: 47.7 ng/mL (ref >5.9)

## 2018-02-26 LAB — VITAMIN B12: Vitamin B-12: 973 pg/mL — ABNORMAL HIGH (ref 180–914)

## 2018-02-26 LAB — POC OCCULT BLOOD, ED: Fecal Occult Bld: POSITIVE — AB

## 2018-02-26 LAB — PREPARE RBC (CROSSMATCH)

## 2018-02-26 MED ORDER — OMEGA-3-ACID ETHYL ESTERS 1 G PO CAPS
1.0000 g | ORAL_CAPSULE | Freq: Every day | ORAL | Status: DC
  Administered 2018-02-26 – 2018-02-27 (×2): 1 g via ORAL
  Filled 2018-02-26 (×5): qty 1

## 2018-02-26 MED ORDER — SODIUM CHLORIDE 0.9 % IV SOLN
510.0000 mg | Freq: Once | INTRAVENOUS | Status: AC
  Administered 2018-02-26: 510 mg via INTRAVENOUS
  Filled 2018-02-26: qty 17

## 2018-02-26 MED ORDER — LATANOPROST 0.005 % OP SOLN
1.0000 [drp] | Freq: Every day | OPHTHALMIC | Status: DC
  Administered 2018-02-27: 1 [drp] via OPHTHALMIC
  Filled 2018-02-26 (×2): qty 2.5

## 2018-02-26 MED ORDER — ACETAMINOPHEN 650 MG RE SUPP: 650.0000 mg | Freq: Four times a day (QID) | RECTAL | Status: DC | PRN

## 2018-02-26 MED ORDER — AMLODIPINE BESYLATE 5 MG PO TABS
5.0000 mg | ORAL_TABLET | Freq: Every day | ORAL | Status: DC
  Administered 2018-02-26 – 2018-02-27 (×2): 5 mg via ORAL
  Filled 2018-02-26 (×2): qty 1

## 2018-02-26 MED ORDER — ONDANSETRON HCL 4 MG PO TABS: 4.0000 mg | ORAL_TABLET | Freq: Four times a day (QID) | ORAL | Status: DC | PRN

## 2018-02-26 MED ORDER — ROSUVASTATIN CALCIUM 10 MG PO TABS
10.0000 mg | ORAL_TABLET | Freq: Every day | ORAL | Status: DC
  Administered 2018-02-26 – 2018-02-27 (×2): 10 mg via ORAL
  Filled 2018-02-26 (×5): qty 1

## 2018-02-26 MED ORDER — METOPROLOL SUCCINATE ER 50 MG PO TB24
100.0000 mg | ORAL_TABLET | Freq: Every day | ORAL | Status: DC
  Administered 2018-02-26 – 2018-02-27 (×2): 100 mg via ORAL
  Filled 2018-02-26 (×2): qty 2

## 2018-02-26 MED ORDER — VITAMIN D3 25 MCG (1000 UNIT) PO TABS
1000.0000 [IU] | ORAL_TABLET | Freq: Every day | ORAL | Status: DC
  Administered 2018-02-26 – 2018-02-27 (×2): 1000 [IU] via ORAL
  Filled 2018-02-26 (×5): qty 1

## 2018-02-26 MED ORDER — SODIUM CHLORIDE 0.9 % IV SOLN
8.0000 mg/h | INTRAVENOUS | Status: DC
  Administered 2018-02-26 – 2018-02-27 (×3): 8 mg/h via INTRAVENOUS
  Filled 2018-02-26 (×5): qty 80

## 2018-02-26 MED ORDER — SPIRONOLACTONE 25 MG PO TABS
25.0000 mg | ORAL_TABLET | Freq: Every day | ORAL | Status: DC
  Administered 2018-02-26 – 2018-02-27 (×2): 25 mg via ORAL
  Filled 2018-02-26 (×2): qty 1

## 2018-02-26 MED ORDER — HYDRALAZINE HCL 25 MG PO TABS
25.0000 mg | ORAL_TABLET | Freq: Two times a day (BID) | ORAL | Status: DC
  Administered 2018-02-26 – 2018-02-27 (×2): 25 mg via ORAL
  Filled 2018-02-26 (×2): qty 1

## 2018-02-26 MED ORDER — PANTOPRAZOLE SODIUM 40 MG IV SOLR: 40.0000 mg | Freq: Two times a day (BID) | INTRAVENOUS | Status: DC

## 2018-02-26 MED ORDER — SODIUM CHLORIDE 0.9 % IV SOLN
10.0000 mL/h | Freq: Once | INTRAVENOUS | Status: AC
  Administered 2018-02-26: 10 mL/h via INTRAVENOUS

## 2018-02-26 MED ORDER — ACETAMINOPHEN 325 MG PO TABS: 650.0000 mg | ORAL_TABLET | Freq: Four times a day (QID) | ORAL | Status: DC | PRN

## 2018-02-26 MED ORDER — PANTOPRAZOLE SODIUM 40 MG IV SOLR
40.0000 mg | Freq: Once | INTRAVENOUS | Status: AC
  Administered 2018-02-26: 40 mg via INTRAVENOUS
  Filled 2018-02-26: qty 40

## 2018-02-26 MED ORDER — ONDANSETRON HCL 4 MG/2ML IJ SOLN: 4.0000 mg | Freq: Four times a day (QID) | INTRAMUSCULAR | Status: DC | PRN

## 2018-02-26 MED ORDER — SODIUM CHLORIDE 0.9 % IV SOLN
80.0000 mg | Freq: Once | INTRAVENOUS | Status: AC
  Administered 2018-02-26: 80 mg via INTRAVENOUS
  Filled 2018-02-26: qty 80

## 2018-02-26 MED ORDER — NITROGLYCERIN 0.4 MG SL SUBL: 0.4000 mg | SUBLINGUAL_TABLET | SUBLINGUAL | Status: DC | PRN

## 2018-02-26 MED ORDER — LEVOTHYROXINE SODIUM 75 MCG PO TABS
75.0000 ug | ORAL_TABLET | Freq: Every day | ORAL | Status: DC
  Administered 2018-02-27: 75 ug via ORAL
  Filled 2018-02-26: qty 1

## 2018-02-26 MED ORDER — LORATADINE 10 MG PO TABS
10.0000 mg | ORAL_TABLET | Freq: Every day | ORAL | Status: DC
  Administered 2018-02-26 – 2018-02-27 (×2): 10 mg via ORAL
  Filled 2018-02-26 (×2): qty 1

## 2018-02-26 NOTE — ED Notes (Signed)
ED Provider at bedside. 

## 2018-02-26 NOTE — Telephone Encounter (Signed)
Critical labs were received by dr simpson early this am and pt was contacted and advised to go to the ER per MD

## 2018-02-26 NOTE — ED Provider Notes (Signed)
Emergency Department Provider Note   I have reviewed the triage vital signs and the nursing notes.   HISTORY  Chief Complaint Abnormal Lab   HPI [name] [name] is a 77 y.o. female with multiple medical problems as documented below the presents to the emergency department secondary to anemia.  Patient states that she has been in her normal state of health little bit more fatigued than normal recently.  She went to her follow-up appointment at her doctor's office yesterday and had labs drawn and today was called and told to come to the hospital as her hemoglobin is low.  It sounds like from what the patient is saying that she has had some type of GI bleed in the past that Dr. Laural Golden had to cauterize.  I reviewed the records it does appear that she has some type of angiodysplastic lesion in her EGD that was cauterized secondary to active oozing.  Also had a couple other ones that were not actively bleeding at that time.  Her colonoscopy showed a couple polyps that were nonneoplastic and multiple diverticula.  She has not noticed any bright red blood in her stools or dark stools.  No vomiting.  No vaginal bleeding.  No other noticeable blood loss. No other associated or modifying symptoms.    Past Medical History:  Diagnosis Date  . 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 Carotid artery disease ([place])   . CKD (chronic kidney disease), stage III ([place])   . Essential hypertension   . Glaucoma    Laser surgery 2010  . Hyperlipidemia   . Hypothyroidism   . Iron deficiency anemia   . Obesity     Patient Active Problem List   Diagnosis Date Noted  . Symptomatic anemia 02/26/2018  . CKD (chronic kidney disease) stage 3, GFR 30-59 ml/min (HCC) 02/26/2018  . Carotid stenosis 02/01/2018  . 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 Absolute anemia 12/04/2016  . GERD (gastroesophageal reflux disease) 11/10/2013  .  Bradycardia, sinus 08/05/2013  . Metabolic syndrome X 62/94/7654  . CAD (coronary artery disease)   . Ejection fraction   . CKD (chronic kidney disease) 07/11/2011  . Hypothyroidism 01/31/2009  . CORONARY ARTERY BYPASS GRAFT, HX OF 01/31/2009  . Carotid artery disease ([place]) 11/30/2008  . IGT (impaired glucose tolerance) 11/28/2008  . Allergic rhinitis 08/12/2008  . Hyperlipidemia LDL goal <70 10/07/2007  . Morbid obesity ([place]) 10/07/2007  . Essential hypertension, malignant 10/07/2007    Past Surgical History:  Procedure Laterality Date  . COLONOSCOPY N/A 02/04/2013   Procedure: COLONOSCOPY;  Surgeon: Rogene Houston, MD;  [facility];  Service: Endoscopy;  Laterality: N/A;  1015  . COLONOSCOPY N/A 12/21/2016   Procedure: COLONOSCOPY;  Surgeon: Rogene Houston, MD;  [facility];  Service: Endoscopy;  Laterality: N/A;  . CORONARY ARTERY BYPASS GRAFT  October 2005   Dr. Roxy Manns - LIMA to LAD, SVG to OM 2 and OM 3, SVG to PDA  . ESOPHAGOGASTRODUODENOSCOPY N/A 12/21/2016   Procedure: ESOPHAGOGASTRODUODENOSCOPY (EGD);  Surgeon: Rogene Houston, MD;  [facility];  Service: Endoscopy;  Laterality: N/A;  11:10  . EYE SURGERY  2010   laser treatment to both eyes for glaucoma      Allergies Clonidine derivatives  Family History  Problem Relation Age of Onset  . Aneurysm Mother   . Hypertension Mother   . Coronary artery disease Mother   .  Coronary artery disease Father   . Diabetes Son   . Pancreatic cancer Son   . Colon cancer Brother 66       lived 18 months afterwards  . Liver cancer Brother 67  . Diabetes Sister   . Coronary artery disease Sister   . Diverticulosis Sister   . COPD Sister   . Kidney disease Sister   . Diabetes Sister   . Hypertension Sister   . Breast cancer Sister 14  . Hearing loss Sister   . Heart Problems Sister        pacemaker    Social History Social History   Tobacco Use  . Smoking status: Former Smoker     Packs/day: 0.25    Years: 40.00    Pack years: 10.00    Types: Cigarettes    Last attempt to quit: 07/03/2003    Years since quitting: 14.6  . Smokeless tobacco: Never Used  Substance Use Topics  . Alcohol use: No    Alcohol/week: 0.0 standard drinks  . Drug use: No    Review of Systems  All other systems negative except as documented in the HPI. All pertinent positives and negatives as reviewed in the HPI. ____________________________________________   PHYSICAL EXAM:  VITAL SIGNS: ED Triage Vitals  Enc Vitals Group     BP 02/26/18 1124 (!) 189/62     Pulse Rate 02/26/18 1124 (!) 59     Resp 02/26/18 1124 16     Temp 02/26/18 1124 98.1 F (36.7 C)     Temp Source 02/26/18 1124 Oral     SpO2 02/26/18 1124 100 %     Weight 02/26/18 1125 190 lb (86.2 kg)    Constitutional: Alert and oriented. Well appearing and in no acute distress. Eyes: Conjunctivae are pale. PERRL. EOMI. Head: Atraumatic. Nose: No congestion/rhinnorhea. Mouth/Throat: Mucous membranes are moist.  Oropharynx non-erythematous. Neck: No stridor.  No meningeal signs.   Cardiovascular: Normal rate, regular rhythm. Good peripheral circulation. Grossly normal heart sounds.   Respiratory: Normal respiratory effort.  No retractions. Lungs CTAB. Gastrointestinal: Soft and nontender. No distention.  Musculoskeletal: No lower extremity tenderness nor edema. No gross deformities of extremities. Neurologic:  Normal speech and language. No gross focal neurologic deficits are appreciated.  Skin:  Skin is warm, dry and intact. No rash noted.   ____________________________________________   LABS (all labs ordered are listed, but only abnormal results are displayed)  Labs Reviewed  CBC WITH DIFFERENTIAL/PLATELET - Abnormal; Notable for the following components:      Result Value   RBC 2.76 (*)    Hemoglobin 6.7 (*)    HCT 22.3 (*)    MCH 24.3 (*)    RDW 16.5 (*)    All other components within normal limits    BASIC METABOLIC PANEL - Abnormal; Notable for the following components:   Glucose, Bld 102 (*)    BUN 33 (*)    Creatinine, Ser 1.92 (*)    Calcium 10.7 (*)    GFR calc non Af Amer 24 (*)    GFR calc Af Amer 28 (*)    All other components within normal limits  VITAMIN B12 - Abnormal; Notable for the following components:   Vitamin B-12 973 (*)    All other components within normal limits  IRON AND TIBC - Abnormal; Notable for the following components:   Iron 20 (*)    TIBC 513 (*)    Saturation Ratios 4 (*)    All  other components within normal limits  FERRITIN - Abnormal; Notable for the following components:   Ferritin 8 (*)    All other components within normal limits  RETICULOCYTES - Abnormal; Notable for the following components:   Retic Ct Pct 3.4 (*)    RBC. 2.76 (*)    All other components within normal limits  POC OCCULT BLOOD, ED - Abnormal; Notable for the following components:   Fecal Occult Bld POSITIVE (*)    All other components within normal limits  FOLATE  OCCULT BLOOD X 1 CARD TO LAB, STOOL  TYPE AND SCREEN  PREPARE RBC (CROSSMATCH)  ABO/RH   ____________________________________________  EKG   EKG Interpretation  Date/Time:  Wednesday February 26 2018 13:10:20 EDT Ventricular Rate:  54 PR Interval:    QRS Duration: 86 QT Interval:  460 QTC Calculation: 436 R Axis:   72 Text Interpretation:  Sinus rhythm Anteroseptal infarct, old Abnormal T, consider ischemia, diffuse leads similar to june 2016 Confirmed by Merrily Pew [phone]) on 02/26/2018 4:09:09 PM       ____________________________________________   PROCEDURES  Procedure(s) performed:   Procedures  CRITICAL CARE Performed by: Merrily Pew Total critical care time: 35 minutes Critical care time was exclusive of separately billable procedures and treating other patients. Critical care was necessary to treat or prevent imminent or life-threatening deterioration. Critical care was time  spent personally by me on the following activities: development of treatment plan with patient and/or surrogate as well as nursing, discussions with consultants, evaluation of patient's response to treatment, examination of patient, obtaining history from patient or surrogate, ordering and performing treatments and interventions, ordering and review of laboratory studies, ordering and review of radiographic studies, pulse oximetry and re-evaluation of patient's condition.   ____________________________________________   INITIAL IMPRESSION / ASSESSMENT AND PLAN / ED COURSE  Will recheck labs to verify and transfuse if necessary. Also ecg for general fatigue workup.  Suspect gi source as she has that history and a positive hemoccult. protonix given. D/w Dr. Laural Golden with Gi who will see and plan for EGD in AM.   Discussed with hospitalist who will admit.   Pertinent labs & imaging results that were available during my care of the patient were reviewed by me and considered in my medical decision making (see chart for details).  ____________________________________________  FINAL CLINICAL IMPRESSION(S) / ED DIAGNOSES  Final diagnoses:  Gastrointestinal hemorrhage, unspecified gastrointestinal hemorrhage type  Anemia, unspecified type     MEDICATIONS GIVEN DURING THIS VISIT:  Medications  amLODipine (NORVASC) tablet 5 mg (has no administration in time range)  hydrALAZINE (APRESOLINE) tablet 25 mg (has no administration in time range)  metoprolol succinate (TOPROL-XL) 24 hr tablet 100 mg (has no administration in time range)  nitroGLYCERIN (NITROSTAT) SL tablet 0.4 mg (has no administration in time range)  rosuvastatin (CRESTOR) tablet 10 mg (has no administration in time range)  spironolactone (ALDACTONE) tablet 25 mg (has no administration in time range)  levothyroxine (SYNTHROID, LEVOTHROID) tablet 75 mcg (has no administration in time range)  cholecalciferol (VITAMIN D) tablet 1,000  Units (has no administration in time range)  omega-3 acid ethyl esters (LOVAZA) capsule 1 g (has no administration in time range)  loratadine (CLARITIN) tablet 10 mg (has no administration in time range)  latanoprost (XALATAN) 0.005 % ophthalmic solution 1 drop (has no administration in time range)  ondansetron (ZOFRAN) tablet 4 mg (has no administration in time range)    Or  ondansetron (ZOFRAN) injection 4 mg (has no  administration in time range)  acetaminophen (TYLENOL) tablet 650 mg (has no administration in time range)    Or  acetaminophen (TYLENOL) suppository 650 mg (has no administration in time range)  ferumoxytol (FERAHEME) 510 mg in sodium chloride 0.9 % 100 mL IVPB (has no administration in time range)  pantoprazole (PROTONIX) 80 mg in sodium chloride 0.9 % 100 mL IVPB (has no administration in time range)  pantoprazole (PROTONIX) 80 mg in sodium chloride 0.9 % 250 mL (0.32 mg/mL) infusion (has no administration in time range)  pantoprazole (PROTONIX) injection 40 mg (has no administration in time range)  0.9 %  sodium chloride infusion (10 mL/hr Intravenous New Bag/Given 02/26/18 1259)  pantoprazole (PROTONIX) injection 40 mg (40 mg Intravenous Given 02/26/18 1507)     NEW OUTPATIENT MEDICATIONS STARTED DURING THIS VISIT:  Current Discharge Medication List      Note:  This note was prepared with assistance of Dragon voice recognition software. Occasional wrong-word or sound-a-like substitutions may have occurred due to the inherent limitations of voice recognition software.   Merrily Pew, MD 02/26/18 [phone]

## 2018-02-26 NOTE — ED Triage Notes (Signed)
Pt sent by Dr Moshe Cipro due to low iron and needs iron infusion. Pt reports feeling tired

## 2018-02-26 NOTE — Telephone Encounter (Signed)
Please call for critical labs use code # [id number]

## 2018-02-26 NOTE — H&P (Signed)
History and Physical  EMRIE GAYLE ZHY:[id number] DOB: [birth year] DOA: 02/26/2018   PCP: Fayrene Helper, MD   Patient coming from: Home  Chief Complaint: fatigue  HPI:  [name] [name] is a 77 y.o. female with medical history of duodenal angiodysplasia, hypertension, hypothyroidism, hyperlipidemia, CKD stage III, iron deficiency anemia, coronary artery disease presenting with 1-2-week history of fatigue.  The patient went to see her primary care provider on 02/25/2018.  Routine blood work showed hemoglobin 6.3.  As result, the patient was contacted to come to the emergency department.  The patient denies any hematochezia, melena, hematuria, hematemesis.  She denies any NSAIDs.  Notably, patient had EGD performed on 12/21/2016 which showed 4 angiodysplastic lesions with one bleeding in the duodenal bulb status post APC.  She denies any chest pain shortness breath, palpitations, dizziness, syncope. In the emergency department, patient was afebrile hemodynamically stable saturating 100% on room air.  Hemoglobin was noted to be 6.7.  Otherwise BMP showed serum creatinine 1.92 which is near her baseline.  Iron saturation was 4% with ferritin 8.  2 units PRBC was ordered.  Assessment/Plan: Symptomatic anemia/chronic blood loss anemia -2 units PRBC ordered -GI consult -12/21/2016 EGD-- showed 4 angiodysplastic lesions with one bleeding in the duodenal bulb status post APC. -12/21/2016 colonoscopy--sigmoid diverticulosis, 2 small polyps in the sigmoid and proximal transverse colon. -Start Protonix drip -Clear liquid diet -Holding aspirin  Iron deficiency anemia -02/25/2018-iron saturation 4%, ferritin 8 -Transfuse Feraheme  Essential hypertension -Continue amlodipine, hydralazine, Toprol succinate  CKD stage III -Baseline creatinine 1.5-1.8 -A.m. BMP  Hyperlipidemia -Continue Crestor  Hypothyroidism -Continue Synthroid  Coronary Artery Disease -no chest pain -EKG without  acute ischemic changes       Past Medical History:  Diagnosis Date  . 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 Carotid artery disease ([place])   . CKD (chronic kidney disease), stage III ([place])   . Essential hypertension   . Glaucoma    Laser surgery 2010  . Hyperlipidemia   . Hypothyroidism   . Iron deficiency anemia   . Obesity    Past Surgical History:  Procedure Laterality Date  . COLONOSCOPY N/A 02/04/2013   Procedure: COLONOSCOPY;  Surgeon: Rogene Houston, MD;  [facility];  Service: Endoscopy;  Laterality: N/A;  1015  . COLONOSCOPY N/A 12/21/2016   Procedure: COLONOSCOPY;  Surgeon: Rogene Houston, MD;  [facility];  Service: Endoscopy;  Laterality: N/A;  . CORONARY ARTERY BYPASS GRAFT  October 2005   Dr. Roxy Manns - LIMA to LAD, SVG to OM 2 and OM 3, SVG to PDA  . ESOPHAGOGASTRODUODENOSCOPY N/A 12/21/2016   Procedure: ESOPHAGOGASTRODUODENOSCOPY (EGD);  Surgeon: Rogene Houston, MD;  [facility];  Service: Endoscopy;  Laterality: N/A;  11:10  . EYE SURGERY  2010   laser treatment to both eyes for glaucoma   Social History:  reports that she quit smoking about 14 years ago. Her smoking use included cigarettes. She has a 10.00 pack-year smoking history. She has never used smokeless tobacco. She reports that she does not drink alcohol or use drugs.   Family History  Problem Relation Age of Onset  . Aneurysm Mother   . Hypertension Mother   . Coronary artery disease Mother   . Coronary artery disease Father   . Diabetes Son   . Pancreatic cancer Son   . Colon cancer Brother 38  lived 18 months afterwards  . Liver cancer Brother 19  . Diabetes Sister   . Coronary artery disease Sister   . Diverticulosis Sister   . COPD Sister   . Kidney disease Sister   . Diabetes Sister   . Hypertension Sister   . Breast cancer Sister 39  . Hearing loss Sister   . Heart Problems Sister        pacemaker      Allergies  Allergen Reactions  . Clonidine Derivatives Other (See Comments)    [place]ie BP dropped     Prior to Admission medications   Medication Sig Start Date End Date Taking? Authorizing Provider  acetaminophen (TYLENOL) 500 MG tablet Take 1,000 mg by mouth every 6 (six) hours as needed for mild pain or headache.    Yes [provider]  amLODipine (NORVASC) 5 MG tablet TAKE ONE TABLET BY MOUTH DAILY. 10/02/17  Yes Fayrene Helper, MD  aspirin 81 MG tablet Take 81 mg by mouth daily.   Yes [provider]  cetirizine (ZYRTEC) 10 MG tablet TAKE ONE TABLET BY MOUTH DAILY. 11/18/17  Yes Fayrene Helper, MD  Cholecalciferol (VITAMIN D3) 1000 UNITS CAPS Take 1 capsule by mouth daily.   Yes [provider]  fish oil-omega-3 fatty acids 1000 MG capsule Take 1 g by mouth daily.    Yes [provider]  hydrALAZINE (APRESOLINE) 25 MG tablet TAKE ONE TABLET BY MOUTH TWICE DAILY. 01/30/18  Yes Satira Sark, MD  latanoprost (XALATAN) 0.005 % ophthalmic solution Place 1 drop into both eyes at bedtime. One drop each eye at bedtime    Yes [provider]  levothyroxine (SYNTHROID, LEVOTHROID) 75 MCG tablet TAKE ONE TABLET BY MOUTH DAILY 12/19/17  Yes Fayrene Helper, MD  metoprolol succinate (TOPROL-XL) 100 MG 24 hr tablet TAKE ONE TABLET BY MOUTH DAILY. TAKE WITH OR IMMEDIATELY FOLLOWING A MEAL. 01/07/18  Yes Fayrene Helper, MD  Misc. Devices (WALL GRAB BAR) MISC Grab bar for shower x 2 DX high fall risk 11/08/17  Yes Fayrene Helper, MD  Multiple Vitamins-Minerals [facility] COMPLETE 50 PLUS PO) Take 1 tablet by mouth daily.    Yes [provider]  omeprazole (PRILOSEC) 40 MG capsule TAKE ONE CAPSULE BY MOUTH DAILY. 01/16/18  Yes Fayrene Helper, MD  Pediatric Multivitamins-Iron (FLINTSTONES PLUS IRON) chewable tablet Chew 1 tablet by mouth 2 (two) times daily. 12/21/16  Yes Rehman, Mechele Dawley, MD  rosuvastatin (CRESTOR) 10 MG  tablet TAKE ONE TABLET BY MOUTH DAILY 12/11/17  Yes Fayrene Helper, MD  spironolactone (ALDACTONE) 25 MG tablet TAKE ONE TABLET BY MOUTH DAILY. 01/07/18  Yes Fayrene Helper, MD  albuterol [facility] HFA) 108 (90 Base) MCG/ACT inhaler INHALE ONE PUFF EVERY FOUR HOURS AS NEEDED. 06/21/17   Fayrene Helper, MD  fluticasone (FLONASE) 50 MCG/ACT nasal spray USE 2 SPRAYS IN EACH NOSTRIL ONCE DAILY.  SHAKE GENTLY BEFORE USING. Patient taking differently: USE 1 SPRAYS IN EACH NOSTRIL ONCE DAILY AS NEEDED FOR ALLERGIES.  SHAKE GENTLY BEFORE USING. 11/07/16   Fayrene Helper, MD  nitroGLYCERIN (NITROSTAT) 0.4 MG SL tablet Place 1 tablet (0.4 mg total) under the tongue every 5 (five) minutes as needed for chest pain. 09/25/17 12/24/17  Satira Sark, MD  Polyethyl Glycol-Propyl Glycol (SYSTANE ULTRA OP) Apply 1 drop to eye as needed (dry eyes).    [provider]    Review of Systems:  Constitutional:  No weight loss, night  sweats, Fevers, chills Head&Eyes: No headache.  No vision loss.  No eye pain or scotoma ENT:  No Difficulty swallowing,Tooth/dental problems,Sore throat,  No ear ache, post nasal drip,  Cardio-vascular:  No chest pain, Orthopnea, PND, swelling in lower extremities,  dizziness, palpitations  GI:  No  abdominal pain, nausea, vomiting, diarrhea, loss of appetite, hematochezia, melena, heartburn, indigestion, Resp:  No shortness of breath with exertion or at rest. No cough. No coughing up of blood .No wheezing.No chest wall deformity  Skin:  no rash or lesions.  GU:  no dysuria, change in color of urine, no urgency or frequency. No flank pain.  Musculoskeletal:  No joint pain or swelling. No decreased range of motion. No back pain.  Psych:  No change in mood or affect. No depression or anxiety. Neurologic: No headache, no dysesthesia, no focal weakness, no vision loss. No syncope  Physical Exam: Vitals:   02/26/18 1125 02/26/18 1139 02/26/18 1200  02/26/18 1303  BP:  (!) 173/57 (!) 152/47 (!) 174/48  Pulse:  (!) 55  (!) 54  Resp:      Temp:      TempSrc:      SpO2:  100%  98%  Weight: 86.2 kg      General:  A&O x 3, NAD, nontoxic, pleasant/cooperative Head/Eye: No conjunctival hemorrhage, no icterus, [place]/AT, No nystagmus ENT:  No icterus,  No thrush, good dentition, no pharyngeal exudate Neck:  No masses, no lymphadenpathy, no bruits CV:  RRR, no rub, no gallop, no S3 Lung:  CTAB, good air movement, no wheeze, no rhonchi Abdomen: soft/NT, +BS, nondistended, no peritoneal signs Ext: No cyanosis, No rashes, No petechiae, No lymphangitis, No edema Neuro: CNII-XII intact, strength 4/5 in bilateral upper and lower extremities, no dysmetria  Labs on Admission:  Basic Metabolic Panel: Recent Labs  Lab 02/25/18 1011 02/26/18 1134  NA 140 141  K 5.1 5.0  CL 107 108  CO2 24 24  GLUCOSE 90 102*  BUN 38* 33*  CREATININE 2.00* 1.92*  CALCIUM 10.5* 10.7*   Liver Function Tests: Recent Labs  Lab 02/25/18 1011  AST 17  ALT 11  BILITOT 0.3  PROT 7.8   No results for input(s): LIPASE, AMYLASE in the last 168 hours. No results for input(s): AMMONIA in the last 168 hours. CBC: Recent Labs  Lab 02/25/18 1011 02/26/18 1134  WBC 6.7 5.4  NEUTROABS  --  2.6  HGB 6.3* 6.7*  HCT 21.5* 22.3*  MCV 80.2 80.8  PLT 248 216   Coagulation Profile: No results for input(s): INR, PROTIME in the last 168 hours. Cardiac Enzymes: No results for input(s): CKTOTAL, CKMB, CKMBINDEX, TROPONINI in the last 168 hours. BNP: Invalid input(s): POCBNP CBG: No results for input(s): GLUCAP in the last 168 hours. Urine analysis:    Component Value Date/Time   BILIRUBINUR neg 06/27/2016 1554   PROTEINUR >300 06/27/2016 1554   UROBILINOGEN 0.2 06/27/2016 1554   NITRITE neg 06/27/2016 1554   LEUKOCYTESUR small (1+) (A) 06/27/2016 1554   Sepsis Labs: @LABRCNTIP (SWFUXNATFTDDU:2,GURKYHCWCBJ:6) )No results found for this or any previous visit  (from the past 240 hour(s)).   Radiological Exams on Admission: No results found.  EKG: Independently reviewed. Sinus, nonspecific T wave changes    Time spent:60 minutes Code Status:   FULL Family Communication:  No Family at bedside Disposition Plan: expect 1-2 day hospitalization Consults called: GI--Rehman  DVT Prophylaxis:SCDs  Orson Eva, DO  [facility] Pager [phone]  If 7PM-7AM, please contact night-coverage  [email or web address] Password [facility] 02/26/2018, 2:47 PM

## 2018-02-26 NOTE — ED Notes (Signed)
Date and time results received: 02/26/18 12:05 PM   Test: hemoglobin  Critical Value: 6.7  Name of Provider Notified: [place]  Orders Received? Or Actions Taken?: see orders

## 2018-02-27 ENCOUNTER — Encounter (HOSPITAL_COMMUNITY)
Admission: EM | Disposition: A | Discharge status: Home / Self Care | Payer: Self-pay | Source: Home / Self Care | Attending: Internal Medicine

## 2018-02-27 ENCOUNTER — Encounter (HOSPITAL_COMMUNITY): Payer: Self-pay | Admitting: *Deleted

## 2018-02-27 DIAGNOSIS — R7302 Impaired glucose tolerance (oral): Secondary | ICD-10-CM | POA: Diagnosis not present

## 2018-02-27 DIAGNOSIS — K922 Gastrointestinal hemorrhage, unspecified: Secondary | ICD-10-CM

## 2018-02-27 DIAGNOSIS — D649 Anemia, unspecified: Secondary | ICD-10-CM | POA: Diagnosis not present

## 2018-02-27 DIAGNOSIS — K228 Other specified diseases of esophagus: Secondary | ICD-10-CM | POA: Diagnosis not present

## 2018-02-27 DIAGNOSIS — E785 Hyperlipidemia, unspecified: Secondary | ICD-10-CM | POA: Diagnosis not present

## 2018-02-27 DIAGNOSIS — N183 Chronic kidney disease, stage 3 (moderate): Secondary | ICD-10-CM | POA: Diagnosis not present

## 2018-02-27 DIAGNOSIS — D500 Iron deficiency anemia secondary to blood loss (chronic): Secondary | ICD-10-CM | POA: Diagnosis not present

## 2018-02-27 HISTORY — PX: ESOPHAGOGASTRODUODENOSCOPY: SHX5428

## 2018-02-27 LAB — CBC
HCT: 21.5 % — ABNORMAL LOW (ref 35.0–45.0)
HEMATOCRIT: 28.2 % — AB (ref 36.0–46.0)
HEMOGLOBIN: 8.7 g/dL — AB (ref 12.0–15.0)
Hemoglobin: 6.3 g/dL — ABNORMAL LOW (ref 11.7–15.5)
MCH: 23.5 pg — AB (ref 27.0–33.0)
MCH: 25.6 pg — ABNORMAL LOW (ref 26.0–34.0)
MCHC: 29.3 g/dL — ABNORMAL LOW (ref 32.0–36.0)
MCHC: 30.9 g/dL (ref 30.0–36.0)
MCV: 80.2 fL (ref 80.0–100.0)
MCV: 82.9 fL (ref 78.0–100.0)
MPV: 11.7 fL (ref 7.5–12.5)
Platelets: 248 10*3/uL (ref 140–400)
Platelets: 226 10*3/uL (ref 150–400)
RBC: 2.68 10*6/uL — AB (ref 3.80–5.10)
RBC: 3.4 MIL/uL — AB (ref 3.87–5.11)
RDW: 15.3 % — ABNORMAL HIGH (ref 11.0–15.0)
RDW: 16 % — ABNORMAL HIGH (ref 11.5–15.5)
WBC: 6.7 10*3/uL (ref 3.8–10.8)
WBC: 6 10*3/uL (ref 4.0–10.5)

## 2018-02-27 LAB — COMPLETE METABOLIC PANEL WITH GFR
AG Ratio: 1.4 (calc) (ref 1.0–2.5)
ALBUMIN MSPROF: 4.5 g/dL (ref 3.6–5.1)
ALKALINE PHOSPHATASE (APISO): 66 U/L (ref 33–130)
ALT: 11 U/L (ref 6–29)
AST: 17 U/L (ref 10–35)
BILIRUBIN TOTAL: 0.3 mg/dL (ref 0.2–1.2)
BUN / CREAT RATIO: 19 (calc) (ref 6–22)
BUN: 38 mg/dL — ABNORMAL HIGH (ref 7–25)
CHLORIDE: 107 mmol/L (ref 98–110)
CO2: 24 mmol/L (ref 20–32)
Calcium: 10.5 mg/dL — ABNORMAL HIGH (ref 8.6–10.4)
Creat: 2 mg/dL — ABNORMAL HIGH (ref 0.60–0.93)
GFR, Est African American: 28 mL/min/{1.73_m2} — ABNORMAL LOW (ref >=60)
GFR, Est Non African American: 24 mL/min/{1.73_m2} — ABNORMAL LOW (ref >=60)
GLOBULIN: 3.3 g/dL (ref 1.9–3.7)
Glucose, Bld: 90 mg/dL (ref 65–99)
POTASSIUM: 5.1 mmol/L (ref 3.5–5.3)
SODIUM: 140 mmol/L (ref 135–146)
Total Protein: 7.8 g/dL (ref 6.1–8.1)

## 2018-02-27 LAB — BASIC METABOLIC PANEL
ANION GAP: 10 (ref 5–15)
BUN: 25 mg/dL — AB (ref 8–23)
CHLORIDE: 110 mmol/L (ref 98–111)
CO2: 23 mmol/L (ref 22–32)
Calcium: 10.3 mg/dL (ref 8.9–10.3)
Creatinine, Ser: 1.74 mg/dL — ABNORMAL HIGH (ref 0.44–1.00)
GFR calc Af Amer: 32 mL/min — ABNORMAL LOW (ref >60)
GFR calc non Af Amer: 27 mL/min — ABNORMAL LOW (ref >60)
Glucose, Bld: 76 mg/dL (ref 70–99)
POTASSIUM: 5.1 mmol/L (ref 3.5–5.1)
SODIUM: 143 mmol/L (ref 135–145)

## 2018-02-27 LAB — LIPID PANEL
CHOLESTEROL: 118 mg/dL (ref <200)
HDL: 45 mg/dL — ABNORMAL LOW (ref >50)
LDL Cholesterol (Calc): 56 mg/dL (calc)
Non-HDL Cholesterol (Calc): 73 mg/dL (calc) (ref <130)
TRIGLYCERIDES: 91 mg/dL (ref <150)
Total CHOL/HDL Ratio: 2.6 (calc) (ref <5.0)

## 2018-02-27 LAB — HEMOGLOBIN A1C
HEMOGLOBIN A1C: 5.5 %{Hb} (ref <5.7)
MEAN PLASMA GLUCOSE: 111 (calc)
eAG (mmol/L): 6.2 (calc)

## 2018-02-27 LAB — HEMOGLOBIN AND HEMATOCRIT, BLOOD
HEMATOCRIT: 27.6 % — AB (ref 36.0–46.0)
Hemoglobin: 8.7 g/dL — ABNORMAL LOW (ref 12.0–15.0)

## 2018-02-27 LAB — URIC ACID: URIC ACID, SERUM: 9.2 mg/dL — AB (ref 2.5–7.0)

## 2018-02-27 LAB — TSH: TSH: 2.79 mIU/L (ref 0.40–4.50)

## 2018-02-27 SURGERY — EGD (ESOPHAGOGASTRODUODENOSCOPY)
Anesthesia: Moderate Sedation

## 2018-02-27 MED ORDER — MIDAZOLAM HCL 5 MG/5ML IJ SOLN
INTRAMUSCULAR | Status: DC | PRN
  Administered 2018-02-27: 2 mg via INTRAVENOUS
  Administered 2018-02-27: 1 mg via INTRAVENOUS

## 2018-02-27 MED ORDER — STERILE WATER FOR IRRIGATION IR SOLN
Status: DC | PRN
  Administered 2018-02-27: 100 mL

## 2018-02-27 MED ORDER — ASPIRIN 81 MG PO TABS: 81.0000 mg | ORAL_TABLET | Freq: Every day | ORAL | 0 refills | Status: DC

## 2018-02-27 MED ORDER — SODIUM CHLORIDE 0.9 % IV SOLN: INTRAVENOUS | Status: DC

## 2018-02-27 MED ORDER — MEPERIDINE HCL 50 MG/ML IJ SOLN
INTRAMUSCULAR | Status: AC
  Filled 2018-02-27: qty 1

## 2018-02-27 MED ORDER — LIDOCAINE VISCOUS HCL 2 % MT SOLN
OROMUCOSAL | Status: AC
  Filled 2018-02-27: qty 15

## 2018-02-27 MED ORDER — SODIUM CHLORIDE 0.9 % IV SOLN
INTRAVENOUS | Status: DC
  Administered 2018-02-27: 11:00:00 via INTRAVENOUS

## 2018-02-27 MED ORDER — MEPERIDINE HCL 50 MG/ML IJ SOLN
INTRAMUSCULAR | Status: DC | PRN
  Administered 2018-02-27: 25 mg via INTRAVENOUS

## 2018-02-27 MED ORDER — MIDAZOLAM HCL 5 MG/5ML IJ SOLN
INTRAMUSCULAR | Status: AC
  Filled 2018-02-27: qty 10

## 2018-02-27 NOTE — Progress Notes (Signed)
Brief EGD note:  Normal examination of the esophagus stomach first and second part of the duodenum.  No bleeding lesion identified.

## 2018-02-27 NOTE — Consult Note (Signed)
Reason for Consult:GI bleed   Referring Physician: Hospitalist  [name] [name] is an 77 y.o. female.  HPI: Admitted yesterday thru the ED. Saw Dr. Moshe Cipro 02/22/2018. C/o tiredness, no energy. Symptoms x 2 weeks. She denies seeing any blood in her stool or melena.  CBC revealed hemoglobin of 6.3. She was called by Dr Moshe Cipro and directed to the ED.  She was guaiac positive in the ED. 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 One with active bleeding/oozing. Treated with argon beam coagulation.  Denies N,V. No fever. Appetite normal. No weight loss. Takes Prilosec 30 minutes before evening meal. Maintained on a Baby ASA (coated) daily. Hx of CKD stage 3, CAD, IDA, high cholesterol, CABG in 2005. Retired. Married. 1 Child age 75 deceased from Pancreatic cancer. One sister recently deceased at age 93 and her funeral is this Saturday.  Has received 2 units of PRBCs and one iron infusion Post transfusion her hemoglobin is 8.7.  Past Medical History:  Diagnosis Date  . 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 Carotid artery disease ([place])   . CKD (chronic kidney disease), stage III ([place])   . Essential hypertension   . Glaucoma    Laser surgery 2010  . Hyperlipidemia   . Hypothyroidism   . Iron deficiency anemia   . Obesity     Past Surgical History:  Procedure Laterality Date  . COLONOSCOPY N/A 02/04/2013   Procedure: COLONOSCOPY;  Surgeon: Rogene Houston, MD;  [facility];  Service: Endoscopy;  Laterality: N/A;  1015  . COLONOSCOPY N/A 12/21/2016   Procedure: COLONOSCOPY;  Surgeon: Rogene Houston, MD;  [facility];  Service: Endoscopy;  Laterality: N/A;  . CORONARY ARTERY BYPASS GRAFT  October 2005   Dr. Roxy Manns - LIMA to LAD, SVG to OM 2 and OM 3, SVG to PDA  . ESOPHAGOGASTRODUODENOSCOPY N/A 12/21/2016   Procedure: ESOPHAGOGASTRODUODENOSCOPY (EGD);  Surgeon: Rogene Houston,  MD;  [facility];  Service: Endoscopy;  Laterality: N/A;  11:10  . EYE SURGERY  2010   laser treatment to both eyes for glaucoma    Family History  Problem Relation Age of Onset  . Aneurysm Mother   . Hypertension Mother   . Coronary artery disease Mother   . Coronary artery disease Father   . Diabetes Son   . Pancreatic cancer Son   . Colon cancer Brother 71       lived 18 months afterwards  . Liver cancer Brother 84  . Diabetes Sister   . Coronary artery disease Sister   . Diverticulosis Sister   . COPD Sister   . Kidney disease Sister   . Diabetes Sister   . Hypertension Sister   . Breast cancer Sister 59  . Hearing loss Sister   . Heart Problems Sister        pacemaker    Social History:  reports that she quit smoking about 14 years ago. Her smoking use included cigarettes. She has a 10.00 pack-year smoking history. She has never used smokeless tobacco. She reports that she does not drink alcohol or use drugs.  Allergies:  Allergies  Allergen Reactions  . Clonidine Derivatives Other (See Comments)    Zombie BP dropped    Medications: I have reviewed the patient's current medications.  Results for orders placed or performed during the hospital encounter of 02/26/18 (from the  past 48 hour(s))  CBC with Differential     Status: Abnormal   Collection Time: 02/26/18 11:34 AM  Result Value Ref Range   WBC 5.4 4.0 - 10.5 K/uL   RBC 2.76 (L) 3.87 - 5.11 MIL/uL   Hemoglobin 6.7 (LL) 12.0 - 15.0 g/dL    Comment: REPEATED TO VERIFY CRITICAL RESULT CALLED TO, READ BACK BY AND VERIFIED WITH: SLASHLEY AT 1205 BY HFLYNT 02/26/18    HCT 22.3 (L) 36.0 - 46.0 %   MCV 80.8 78.0 - 100.0 fL   MCH 24.3 (L) 26.0 - 34.0 pg   MCHC 30.0 30.0 - 36.0 g/dL   RDW 16.5 (H) 11.5 - 15.5 %   Platelets 216 150 - 400 K/uL   Neutrophils Relative % 49 %   Lymphocytes Relative 39 %   Monocytes Relative 6 %   Eosinophils Relative 5 %   Basophils Relative 1 %   Neutro Abs 2.6  1.7 - 7.7 K/uL   Lymphs Abs 2.1 0.7 - 4.0 K/uL   Monocytes Absolute 0.3 0.1 - 1.0 K/uL   Eosinophils Absolute 0.3 0.0 - 0.7 K/uL   Basophils Absolute 0.1 0.0 - 0.1 K/uL   RBC Morphology ANISOCYTES     Comment: MICROCYTES OVAL MACROCYTES Performed at [facility], [address], [place], [place] [address]   Basic metabolic panel     Status: Abnormal   Collection Time: 02/26/18 11:34 AM  Result Value Ref Range   Sodium 141 135 - 145 mmol/L   Potassium 5.0 3.5 - 5.1 mmol/L   Chloride 108 98 - 111 mmol/L   CO2 24 22 - 32 mmol/L   Glucose, Bld 102 (H) 70 - 99 mg/dL   BUN 33 (H) 8 - 23 mg/dL   Creatinine, Ser 1.92 (H) 0.44 - 1.00 mg/dL   Calcium 10.7 (H) 8.9 - 10.3 mg/dL   GFR calc non Af Amer 24 (L) >60 mL/min   GFR calc Af Amer 28 (L) >60 mL/min    Comment: (NOTE) The eGFR has been calculated using the CKD EPI equation. This calculation has not been validated in all clinical situations. eGFR's persistently <60 mL/min signify possible Chronic Kidney Disease.    Anion gap 9 5 - 15    Comment: Performed at [facility], [address], [place], [place] [address]  Vitamin B12     Status: Abnormal   Collection Time: 02/26/18 11:34 AM  Result Value Ref Range   Vitamin B-12 973 (H) 180 - 914 pg/mL    Comment: (NOTE) This assay is not validated for testing neonatal or myeloproliferative syndrome specimens for Vitamin B12 levels. Performed at [facility], [address], [place], [place] [address]   Folate     Status: None   Collection Time: 02/26/18 11:34 AM  Result Value Ref Range   Folate 47.7 >5.9 ng/mL    Comment: RESULTS CONFIRMED BY MANUAL DILUTION Performed at [facility], [address], [place], [place] [address]   Iron and TIBC     Status: Abnormal   Collection Time: 02/26/18 11:34 AM  Result Value Ref Range   Iron 20 (L) 28 - 170 ug/dL   TIBC 513 (H) 250 - 450 ug/dL   Saturation Ratios 4 (L) 10.4 - 31.8 %   UIBC 493 ug/dL    Comment: Performed at [facility], [address], [place], [place] [address]  Ferritin     Status: Abnormal   Collection Time: 02/26/18 11:34 AM  Result Value Ref  Range   Ferritin 8 (L) 11 - 307 ng/mL    Comment: Performed at [facility], [address] [place], [place] [address]  Reticulocytes     Status: Abnormal   Collection Time: 02/26/18 11:34 AM  Result Value Ref Range   Retic Ct Pct 3.4 (H) 0.4 - 3.1 %   RBC. 2.76 (L) 3.87 - 5.11 MIL/uL   Retic Count, Absolute 93.8 19.0 - 186.0 K/uL    Comment: Performed at [facility] [address] [place], [place] [address]  ABO/Rh     Status: None   Collection Time: 02/26/18 12:20 PM  Result Value Ref Range   ABO/RH(D)      A POS Performed at [facility], [address] [place], [place] [address]   Type and screen     Status: None (Preliminary result)   Collection Time: 02/26/18 12:29 PM  Result Value Ref Range   ABO/RH(D) A POS    Antibody Screen NEG    Sample Expiration 03/01/2018    Unit Number [id number]    Blood Component Type RED CELLS,LR    Unit division 00    Status of Unit ISSUED    Transfusion Status OK TO TRANSFUSE    Crossmatch Result Compatible    Unit Number [id number]    Blood Component Type RED CELLS,LR    Unit division 00    Status of Unit ISSUED,FINAL    Transfusion Status OK TO TRANSFUSE    Crossmatch Result      Compatible Performed at [facility], [address], [place], [place] [address]   Prepare RBC     Status: None   Collection Time: 02/26/18 12:29 PM  Result Value Ref Range   Order Confirmation      ORDER PROCESSED BY BLOOD BANK Performed at [facility], [address] [place], [place] [address]   POC occult blood, ED     Status: Abnormal   Collection Time: 02/26/18  1:06 PM  Result Value Ref Range   Fecal Occult Bld POSITIVE (A) NEGATIVE  Basic metabolic panel     Status: Abnormal   Collection Time: 02/27/18  4:48 AM  Result Value Ref Range   Sodium 143 135 - 145 mmol/L   Potassium 5.1 3.5 - 5.1 mmol/L    Chloride 110 98 - 111 mmol/L   CO2 23 22 - 32 mmol/L   Glucose, Bld 76 70 - 99 mg/dL   BUN 25 (H) 8 - 23 mg/dL   Creatinine, Ser 1.74 (H) 0.44 - 1.00 mg/dL   Calcium 10.3 8.9 - 10.3 mg/dL   GFR calc non Af Amer 27 (L) >60 mL/min   GFR calc Af Amer 32 (L) >60 mL/min    Comment: (NOTE) The eGFR has been calculated using the CKD EPI equation. This calculation has not been validated in all clinical situations. eGFR's persistently <60 mL/min signify possible Chronic Kidney Disease.    Anion gap 10 5 - 15    Comment: Performed at [facility], [address], [place], [place] [address]  CBC     Status: Abnormal   Collection Time: 02/27/18  4:48 AM  Result Value Ref Range   WBC 6.0 4.0 - 10.5 K/uL   RBC 3.40 (L) 3.87 - 5.11 MIL/uL   Hemoglobin 8.7 (L) 12.0 - 15.0 g/dL    Comment: DELTA CHECK NOTED POST TRANSFUSION SPECIMEN    HCT 28.2 (L) 36.0 - 46.0 %   MCV 82.9 78.0 - 100.0 fL   MCH  25.6 (L) 26.0 - 34.0 pg   MCHC 30.9 30.0 - 36.0 g/dL   RDW 16.0 (H) 11.5 - 15.5 %   Platelets 226 150 - 400 K/uL    Comment: Performed at [facility], [address] [place], [place] [address]    No results found.  ROS Blood pressure (!) 159/48, pulse (!) 44, temperature 98 F (36.7 C), temperature source Oral, resp. rate 18, height '5\' 1"'  (1.549 m), weight 86.2 kg, SpO2 98 %. Physical Exam Alert and oriented. Skin warm and dry. Oral mucosa is moist.   . Sclera anicteric, conjunctivae is pink. Thyroid not enlarged. No cervical lymphadenopathy. Lungs clear. Heart regular rate and rhythm.  Abdomen is soft. Bowel sounds are positive. No hepatomegaly. No abdominal masses felt. No tenderness.  No edema to lower extremities.   Assessment/Plan: Upper GI bleed probably from angiodysplastics lesions in the duodenum. EGD today. Dr. Laural Golden iis aware.  Jalilah Wiltsie L Kahleb Mcclane 02/27/2018, 8:13 AM

## 2018-02-27 NOTE — Progress Notes (Signed)
Returned from endo around 1400 and was drowsy.  At this time alert and sitting in chair.  Dressed self with minimal assist.  IVs remvoed and discharge instructions reviewed.  Transported by wc to car.  Niece to drive home

## 2018-02-27 NOTE — Care Management CC44 (Signed)
Condition Code 44 Documentation Completed  Patient Details  Name: [name] [name] MRN: [id number] Date of Birth: [birth year]   Condition Code 44 given:  Yes Patient signature on Condition Code 44 notice:  Yes Documentation of 2 MD's agreement:  Yes Code 44 added to claim:  Yes    Sherald Barge, RN 02/27/2018, 4:26 PM

## 2018-02-27 NOTE — Care Management Obs Status (Signed)
[place] NOTIFICATION   Patient Details  Name: [name] [name] MRN: [id number] Date of Birth: December 23, 1940   Medicare Observation Status Notification Given:  Yes    Sherald Barge, RN 02/27/2018, 4:26 PM

## 2018-02-27 NOTE — Discharge Summary (Signed)
Physician Discharge Summary  [name] [name] IRW:[id number] DOB: 1941/01/29 DOA: 02/26/2018  PCP: Fayrene Helper, MD  Admit date: 02/26/2018 Discharge date: 02/27/2018  Admitted From: Home Disposition:  Home   Recommendations for Outpatient Follow-up:  1. Follow up with PCP in 1-2 weeks 2. Please obtain BMP/CBC in one week   Discharge Condition: Stable CODE STATUS: FULL Diet recommendation: Heart Healthy   Brief/Interim Summary: 77 y.o. female with medical history of duodenal angiodysplasia, hypertension, hypothyroidism, hyperlipidemia, CKD stage III, iron deficiency anemia, coronary artery disease presenting with 1-2-week history of fatigue.  The patient went to see her primary care provider on 02/25/2018.  Routine blood work showed hemoglobin 6.3.  As result, the patient was contacted to come to the emergency department.  Notably, patient had EGD performed on 12/21/2016 which showed 4 angiodysplastic lesions with one bleeding in the duodenal bulb status post APC.  She denies any chest pain shortness breath, palpitations, dizziness, syncope. In the emergency department, patient was afebrile hemodynamically stable saturating 100% on room air.  Hemoglobin was noted to be 6.7.  Otherwise BMP showed serum creatinine 1.92 which is near her baseline.  Iron saturation was 4% with ferritin 8.  2 units PRBC were transfused.  Feraheme x 1 was given.  GI was consulted.  EGD was performed 02/27/18 which was normal.  GI felt pt was stable for d/c for out pt followup for capsule study.  Her Hgb remained stable after transfusion.  Discharge Diagnoses:  Symptomatic anemia/chronic blood loss anemia -2 units PRBC ordered -GI consult -12/21/2016 EGD-- showed 4 angiodysplastic lesions with one bleeding in the duodenal bulb status post APC. -12/21/2016 colonoscopy--sigmoid diverticulosis, 2 small polyps in the sigmoid and proximal transverse colon. -Start Protonix drip initially -02/27/18 EGD--normal -Dr.  Laural Golden cleared for d/c with outpt capsule endoscopy -hold iron until after capsule endoscopy as outpt -Holding aspirin until 03/02/18  Iron deficiency anemia -02/25/2018-iron saturation 4%, ferritin 8 -Transfuse Feraheme x 1  Essential hypertension -Continue amlodipine, hydralazine, metoprolol succinate -d/c spironolactone due to rising potassium  CKD stage III -Baseline creatinine 1.5-1.8 -A.m. BMP  Hyperlipidemia -Continue Crestor  Hypothyroidism -Continue Synthroid  Coronary Artery Disease -no chest pain -EKG without acute ischemic changes  Discharge Instructions   Allergies as of 02/27/2018      Reactions   Clonidine Derivatives Other (See Comments)   Zombie BP dropped      Medication List    STOP taking these medications   FLINTSTONES PLUS IRON chewable tablet   spironolactone 25 MG tablet Commonly known as:  ALDACTONE     TAKE these medications   acetaminophen 500 MG tablet Commonly known as:  TYLENOL Take 1,000 mg by mouth every 6 (six) hours as needed for mild pain or headache.   albuterol 108 (90 Base) MCG/ACT inhaler Commonly known as:  PROVENTIL HFA;VENTOLIN HFA INHALE ONE PUFF EVERY FOUR HOURS AS NEEDED.   amLODipine 5 MG tablet Commonly known as:  NORVASC TAKE ONE TABLET BY MOUTH DAILY.   aspirin 81 MG tablet Take 1 tablet (81 mg total) by mouth daily. Restart on 03/02/18 Start taking on:  03/02/2018 What changed:    additional instructions  These instructions start on 03/02/2018. If you are unsure what to do until then, ask your doctor or other care provider.   cetirizine 10 MG tablet Commonly known as:  ZYRTEC TAKE ONE TABLET BY MOUTH DAILY.   fish oil-omega-3 fatty acids 1000 MG capsule Take 1 g by mouth daily.   fluticasone 50 MCG/ACT  nasal spray Commonly known as:  FLONASE USE 2 SPRAYS IN EACH NOSTRIL ONCE DAILY.  SHAKE GENTLY BEFORE USING. What changed:  See the new instructions.   hydrALAZINE 25 MG tablet Commonly known  as:  APRESOLINE TAKE ONE TABLET BY MOUTH TWICE DAILY.   latanoprost 0.005 % ophthalmic solution Commonly known as:  XALATAN Place 1 drop into both eyes at bedtime. One drop each eye at bedtime   levothyroxine 75 MCG tablet Commonly known as:  SYNTHROID, LEVOTHROID TAKE ONE TABLET BY MOUTH DAILY   metoprolol succinate 100 MG 24 hr tablet Commonly known as:  TOPROL-XL TAKE ONE TABLET BY MOUTH DAILY. TAKE WITH OR IMMEDIATELY FOLLOWING A MEAL.   nitroGLYCERIN 0.4 MG SL tablet Commonly known as:  NITROSTAT Place 1 tablet (0.4 mg total) under the tongue every 5 (five) minutes as needed for chest pain.   omeprazole 40 MG capsule Commonly known as:  PRILOSEC TAKE ONE CAPSULE BY MOUTH DAILY.   rosuvastatin 10 MG tablet Commonly known as:  CRESTOR TAKE ONE TABLET BY MOUTH DAILY   SYSTANE ULTRA OP Apply 1 drop to eye as needed (dry eyes).   THERATRUM COMPLETE 50 PLUS PO Take 1 tablet by mouth daily.   Vitamin D3 1000 units Caps Take 1 capsule by mouth daily.   Wall Grab Bar Misc Grab bar for shower x 2 DX high fall risk       Allergies  Allergen Reactions  . Clonidine Derivatives Other (See Comments)    Zombie BP dropped    Consultations:  GI--Rehman   Procedures/Studies: No results found.      Discharge Exam: Vitals:   02/27/18 1330 02/27/18 1446  BP: (!) 117/47 (!) 133/51  Pulse: (!) 46 (!) 44  Resp: 12 18  Temp:    SpO2: 98% 96%   Vitals:   02/27/18 1320 02/27/18 1325 02/27/18 1330 02/27/18 1446  BP: (!) 158/68 (!) 123/44 (!) 117/47 (!) 133/51  Pulse: (!) 56 (!) 52 (!) 46 (!) 44  Resp: 14 13 12 18   Temp:      TempSrc:      SpO2: 100% 97% 98% 96%  Weight:      Height:        General: Pt is alert, awake, not in acute distress Cardiovascular: RRR, S1/S2 +, no rubs, no gallops Respiratory: CTA bilaterally, no wheezing, no rhonchi Abdominal: Soft, NT, ND, bowel sounds + Extremities: no edema, no cyanosis   The results of significant  diagnostics from this hospitalization (including imaging, microbiology, ancillary and laboratory) are listed below for reference.    Significant Diagnostic Studies: No results found.   Microbiology: No results found for this or any previous visit (from the past 240 hour(s)).   Labs: Basic Metabolic Panel: Recent Labs  Lab 02/25/18 1011 02/26/18 1134 02/27/18 0448  NA 140 141 143  K 5.1 5.0 5.1  CL 107 108 110  CO2 24 24 23   GLUCOSE 90 102* 76  BUN 38* 33* 25*  CREATININE 2.00* 1.92* 1.74*  CALCIUM 10.5* 10.7* 10.3   Liver Function Tests: Recent Labs  Lab 02/25/18 1011  AST 17  ALT 11  BILITOT 0.3  PROT 7.8   No results for input(s): LIPASE, AMYLASE in the last 168 hours. No results for input(s): AMMONIA in the last 168 hours. CBC: Recent Labs  Lab 02/25/18 1011 02/26/18 1134 02/27/18 0448 02/27/18 1422  WBC 6.7 5.4 6.0  --   NEUTROABS  --  2.6  --   --  HGB 6.3* 6.7* 8.7* 8.7*  HCT 21.5* 22.3* 28.2* 27.6*  MCV 80.2 80.8 82.9  --   PLT 248 216 226  --    Cardiac Enzymes: No results for input(s): CKTOTAL, CKMB, CKMBINDEX, TROPONINI in the last 168 hours. BNP: Invalid input(s): POCBNP CBG: No results for input(s): GLUCAP in the last 168 hours.  Time coordinating discharge:  36 minutes  Signed:  Orson Eva, DO [facility] Pager: [phone] 02/27/2018, 3:49 PM

## 2018-02-27 NOTE — Discharge Instructions (Signed)
CBC AND BMP LABS      PRIMARY MD IN ONE WEEK

## 2018-02-27 NOTE — Op Note (Signed)
[facility] Patient Name: [name] [name] Procedure Date: 02/27/2018 12:34 PM MRN: [id number] Date of Birth: 1941/02/07 Attending MD: Hildred Laser , MD CSN: [id number] Age: 77 Admit Type: Outpatient Procedure:                Upper GI endoscopy Indications:              Iron deficiency anemia secondary to chronic blood                            loss Providers:                Hildred Laser, MD, Rosina Lowenstein, RN, Randa Spike, Technician Referring MD:             Orson Eva, DO Medicines:                Lidocaine spray, Meperidine 25 mg IV, Midazolam 3                            mg IV Complications:            No immediate complications. Estimated Blood Loss:     Estimated blood loss: none. Procedure:                Pre-Anesthesia Assessment:                           - Prior to the procedure, a History and Physical                            was performed, and patient medications and                            allergies were reviewed. The patient's tolerance of                            previous anesthesia was also reviewed. The risks                            and benefits of the procedure and the sedation                            options and risks were discussed with the patient.                            All questions were answered, and informed consent                            was obtained. Prior Anticoagulants: The patient                            last took aspirin 2 days prior to the procedure.  ASA Grade Assessment: III - A patient with severe                            systemic disease. After reviewing the risks and                            benefits, the patient was deemed in satisfactory                            condition to undergo the procedure.                           After obtaining informed consent, the endoscope was                            passed under direct vision. Throughout the                 procedure, the patient's blood pressure, pulse, and                            oxygen saturations were monitored continuously. The                            [id number] ([id number]) scope was introduced through the                            mouth, and advanced to the second part of duodenum.                            The upper GI endoscopy was accomplished without                            difficulty. The patient tolerated the procedure                            well. Scope In: 1:20:43 PM Scope Out: 1:26:48 PM Total Procedure Duration: 0 hours 6 minutes 5 seconds  Findings:      The examined esophagus was normal.      The Z-line was irregular and was found 37 cm from the incisors.      The entire examined stomach was normal.      The duodenal bulb and second portion of the duodenum were normal. Impression:               - Normal esophagus.                           - Z-line irregular, 37 cm from the incisors.                           - Normal stomach.                           - Normal duodenal bulb and second portion of the  duodenum.                           - No specimens collected. Moderate Sedation:      Moderate (conscious) sedation was administered by the endoscopy nurse       and supervised by the endoscopist. The following parameters were       monitored: oxygen saturation, heart rate, blood pressure, CO2       capnography and response to care. Total physician intraservice time was       9 minutes. Recommendation:           - Return patient to hospital ward for ongoing care.                           - To visualize the small bowel, perform video                            capsule endoscopy at appointment to be scheduled.                           - Diabetic (ADA) diet today.                           - H/H this afternoon.                           - Hold iron for now.                           - Small bowel Given capsule study next  week.                           - Resume Aspirin on Sept 1,2019. Procedure Code(s):        --- Professional ---                           [phone], Esophagogastroduodenoscopy, flexible,                            transoral; diagnostic, including collection of                            specimen(s) by brushing or washing, when performed                            (separate procedure) Diagnosis Code(s):        --- Professional ---                           K22.8, Other specified diseases of esophagus                           D50.0, Iron deficiency anemia secondary to blood                            loss (chronic) CPT copyright 2017 American Medical Association. All rights reserved. The codes  documented in this report are preliminary and upon coder review may  be revised to meet current compliance requirements. Hildred Laser, MD Hildred Laser, MD 02/27/2018 1:38:46 PM This report has been signed electronically. Number of Addenda: 0

## 2018-02-27 NOTE — Care Management Note (Signed)
Case Management Note  Patient Details  Name: CHARLANE WESTRY MRN: [id number] Date of Birth: Oct 30, 1940  Subjective/Objective:  Symptomatic anemia. From home with husband. Independent. No assistive devices. Still drives. Has PCP, insurance, and transportation. EGD planned for today.                   Action/Plan: DC home. No CM needs.   Expected Discharge Date:   02/28/2018               Expected Discharge Plan:  Home/Self Care  In-House Referral:     Discharge planning Services  CM Consult  Post Acute Care Choice:  NA Choice offered to:  NA  DME Arranged:    DME Agency:     HH Arranged:    HH Agency:     Status of Service:  Completed, signed off  If discussed at [employer] of Stay Meetings, dates discussed:    Additional Comments:  Stewart Sasaki, Chauncey Reading, RN 02/27/2018, 10:58 AM

## 2018-02-28 ENCOUNTER — Telehealth: Payer: Self-pay

## 2018-02-28 LAB — TYPE AND SCREEN
ABO/RH(D): A POS
ANTIBODY SCREEN: NEGATIVE
UNIT DIVISION: 0
Unit division: 0

## 2018-02-28 LAB — BPAM RBC
BLOOD PRODUCT EXPIRATION DATE: 201909112359
BLOOD PRODUCT EXPIRATION DATE: 201909112359
ISSUE DATE / TIME: 201908281515
ISSUE DATE / TIME: 201908290037
UNIT TYPE AND RH: 6200
Unit Type and Rh: 6200

## 2018-02-28 NOTE — Telephone Encounter (Signed)
Transition Care Management Follow-up Telephone Call   Date discharged?   02/27/18             How have you been since you were released from the hospital? not completely up to par,but getting better   Do you understand why you were in the hospital? bleeding   Do you understand the discharge instructions?yes   Where were you discharged to? home   Items Reviewed:  Medications reviewed: yes  Allergies reviewed: yes  Dietary changes reviewed: yes  Referrals reviewed: Dr.Rehman   Functional Questionnaire:   Activities of Daily Living (ADLs):  was told to take it easy, has help if she needs it    Any transportation issues/concerns?: no   Any patient concerns? just wants to get everything taken care of   Confirmed importance and date/time of follow-up visits scheduled yes     Confirmed with patient if condition begins to worsen call PCP or go to the ER.  Patient was given the office number and encouraged to call back with question or concerns.  yes with verbal understanding

## 2018-03-04 ENCOUNTER — Other Ambulatory Visit: Payer: Self-pay | Admitting: Family Medicine

## 2018-03-04 ENCOUNTER — Encounter: Payer: Self-pay | Admitting: Family Medicine

## 2018-03-04 ENCOUNTER — Other Ambulatory Visit (INDEPENDENT_AMBULATORY_CARE_PROVIDER_SITE_OTHER): Payer: Self-pay | Admitting: Internal Medicine

## 2018-03-04 ENCOUNTER — Ambulatory Visit (INDEPENDENT_AMBULATORY_CARE_PROVIDER_SITE_OTHER): Payer: Medicare Other | Admitting: Family Medicine

## 2018-03-04 VITALS — BP 138/78 | HR 53 | Resp 14 | Ht 61.0 in | Wt 193.0 lb

## 2018-03-04 DIAGNOSIS — E785 Hyperlipidemia, unspecified: Secondary | ICD-10-CM

## 2018-03-04 DIAGNOSIS — D649 Anemia, unspecified: Secondary | ICD-10-CM

## 2018-03-04 DIAGNOSIS — Z09 Encounter for follow-up examination after completed treatment for conditions other than malignant neoplasm: Secondary | ICD-10-CM | POA: Diagnosis not present

## 2018-03-04 DIAGNOSIS — Z23 Encounter for immunization: Secondary | ICD-10-CM

## 2018-03-04 DIAGNOSIS — K921 Melena: Secondary | ICD-10-CM | POA: Insufficient documentation

## 2018-03-04 DIAGNOSIS — I10 Essential (primary) hypertension: Secondary | ICD-10-CM

## 2018-03-04 DIAGNOSIS — Z0000 Encounter for general adult medical examination without abnormal findings: Secondary | ICD-10-CM | POA: Diagnosis not present

## 2018-03-04 DIAGNOSIS — E559 Vitamin D deficiency, unspecified: Secondary | ICD-10-CM

## 2018-03-04 DIAGNOSIS — M5136 Other intervertebral disc degeneration, lumbar region: Secondary | ICD-10-CM

## 2018-03-04 DIAGNOSIS — D509 Iron deficiency anemia, unspecified: Secondary | ICD-10-CM | POA: Insufficient documentation

## 2018-03-04 DIAGNOSIS — M1711 Unilateral primary osteoarthritis, right knee: Secondary | ICD-10-CM

## 2018-03-04 DIAGNOSIS — M4316 Spondylolisthesis, lumbar region: Secondary | ICD-10-CM

## 2018-03-04 LAB — CBC
HCT: 30.4 % — ABNORMAL LOW (ref 35.0–45.0)
Hemoglobin: 9.6 g/dL — ABNORMAL LOW (ref 11.7–15.5)
MCH: 25.9 pg — AB (ref 27.0–33.0)
MCHC: 31.6 g/dL — AB (ref 32.0–36.0)
MCV: 81.9 fL (ref 80.0–100.0)
MPV: 11.1 fL (ref 7.5–12.5)
Platelets: 236 10*3/uL (ref 140–400)
RBC: 3.71 10*6/uL — ABNORMAL LOW (ref 3.80–5.10)
RDW: 16.3 % — ABNORMAL HIGH (ref 11.0–15.0)
WBC: 5.9 10*3/uL (ref 3.8–10.8)

## 2018-03-04 LAB — BASIC METABOLIC PANEL WITH GFR
BUN / CREAT RATIO: 14 (calc) (ref 6–22)
BUN: 25 mg/dL (ref 7–25)
CO2: 25 mmol/L (ref 20–32)
Calcium: 10.8 mg/dL — ABNORMAL HIGH (ref 8.6–10.4)
Chloride: 109 mmol/L (ref 98–110)
Creat: 1.76 mg/dL — ABNORMAL HIGH (ref 0.60–0.93)
GFR, EST AFRICAN AMERICAN: 32 mL/min/{1.73_m2} — AB (ref >=60)
GFR, EST NON AFRICAN AMERICAN: 28 mL/min/{1.73_m2} — AB (ref >=60)
Glucose, Bld: 89 mg/dL (ref 65–139)
POTASSIUM: 5.7 mmol/L — AB (ref 3.5–5.3)
Sodium: 142 mmol/L (ref 135–146)

## 2018-03-04 MED ORDER — SPIRONOLACTONE 25 MG PO TABS: 25.0000 mg | ORAL_TABLET | Freq: Every day | ORAL | 5 refills | Status: DC

## 2018-03-04 MED ORDER — ROSUVASTATIN CALCIUM 10 MG PO TABS: 10.0000 mg | ORAL_TABLET | Freq: Every day | ORAL | 3 refills | Status: DC

## 2018-03-04 NOTE — Assessment & Plan Note (Signed)
increased swelling and instability, needs shower chair for safety

## 2018-03-04 NOTE — Assessment & Plan Note (Addendum)
Hospital course reviewed and studies which need to be completed by GI also discussed. Feels better , less weak since receiving 2 Units PRBC, realizes that all of her pain med is tylenol based Taking spironolactone which she resumed following hospitalization , although she was told not to because of high potassium, updated lab is being obtained, and she is currently on high dose PPI.per GI

## 2018-03-04 NOTE — Progress Notes (Signed)
    [name] [name]     MRN: [id number]      DOB: 10/06/1940  HPI: Patient is in for annual physical exam. Transitional care visit also completed Hospital course is reviewed with the patient, she understands that the exact location of her GI bleed is still being worked up, and she will try to get the appt info from her GI Doc as to when the capsule study is to be done She resumed aspirin yesterday and is not currently taking iron per recommendations of GI Feels ;less weak and tired, denies ever seeing [facility]  Updated labs following hospitalization are being completed today  Recent labs,  are reviewed. Immunization is reviewed , and  updated   PE: BP 138/78   Pulse (!) 53   Resp 14   Ht 5\' 1"  (1.549 m)   Wt 193 lb (87.5 kg)   SpO2 98%   BMI 36.47 kg/m  Pleasant  female, alert and oriented x 3, in no cardio-pulmonary distress. Afebrile. HEENT No facial trauma or asymetry. Sinuses non tender.  Extra occullar muscles intact, pupils equally reactive to light. External ears normal, tympanic membranes clear. Oropharynx moist, no exudate. Neck: supple, no adenopathy,JVD or thyromegaly.No bruits.  Chest: Clear to ascultation bilaterally.No crackles or wheezes. Non tender to palpation  Breast: No asymetry,no masses or lumps. No tenderness. No nipple discharge or inversion. No axillary or supraclavicular adenopathy  Cardiovascular system; Heart sounds normal,  S1 and  S2 ,no S3.  No murmur, or thrill. Apical beat not displaced Peripheral pulses normal.  Abdomen: Soft, non tender, no organomegaly or masses. No bruits. Bowel sounds normal. No guarding, tenderness or rebound.  Rectal:  Currently being evaluated  by GI for acute blood loss GU: No  Exam, asymptomatic  Musculoskeletal exam: Markedly Decreased ROM of lumbar  spine, reduced ROM hips  And  shoulders and markedly reduced in  knees. Deformity ,swelling and  crepitus noted in knees No muscle wasting or atrophy.    Neurologic: Cranial nerves 2 to 12 intact. Power, tone , and reflexes normal throughout. Abnormal  gait. No tremor.  Skin: Intact, no ulceration, erythema , scaling or rash noted. Pigmentation normal throughout  Psych; Normal mood and affect. Judgement and concentration normal   Assessment & Plan:  Annual physical exam Annual exam as documented. Counseling done  re healthy lifestyle involving commitment to 150 minutes exercise per week, heart healthy diet, and attaining healthy weight.The importance of adequate sleep also discussed. Regular seat belt use and home safety, is also discussed. Changes in health habits are decided on by the patient with goals and time frames  set for achieving them. Immunization and cancer screening needs are specifically addressed at this visit.   Lumbar adjacent segment disease with spondylolisthesis High fall risk with limitation in mobility, requests shower chair, which is appropriate, also experiencing increased knne pain and stiffness with instability.  Osteoarthritis of right knee increased swelling and instability, needs shower chair for safety  Hospital discharge follow-up Hospital course reviewed and studies which need to be completed by GI also discussed. Feels better , less weak since receiving 2 Units PRBC, realizes that all of her pain med is tylenol based Taking spironolactone which she resumed following hospitalization , although she was told not to because of high potassium, updated lab is being obtained, and she is currently on high dose PPI.per GI

## 2018-03-04 NOTE — Assessment & Plan Note (Signed)

## 2018-03-04 NOTE — Patient Instructions (Signed)
F/u in 4.,5  months, call if you need me before ( please sched same date and around same time as her husband)  Flu vaccine today  Labs today, CBC, chem 7 and eGFR  TSH, cBC, fasting lipid, cmp and eGFR and tSH and Vit D  for 1 week before f/u visit   Visit has notes which justify the need for shower chair, please let Nurse know where( which supplier) to send request   [place] condolence , on recent passing of your sister  Please be careful nno tto fall and continue to watch food choice  Thank you  for choosing [facility]. We consider it a privelige to serve you.  Delivering excellent health care in a caring and  compassionate way is our goal.  Partnering with you,  so that together we can achieve this goal is our strategy.

## 2018-03-04 NOTE — Assessment & Plan Note (Addendum)
High fall risk with limitation in mobility, requests shower chair, which is appropriate, also experiencing increased knne pain and stiffness with instability.

## 2018-03-06 ENCOUNTER — Telehealth: Payer: Self-pay

## 2018-03-06 DIAGNOSIS — I10 Essential (primary) hypertension: Secondary | ICD-10-CM

## 2018-03-06 NOTE — Telephone Encounter (Signed)
-----  Message from Fayrene Helper, MD sent at 03/04/2018 11:47 PM EDT ----- Your potassium is again too hiigh, please DISCONTINUE spironolactone, you need an appt to f/u blood pressure and get non fasting chem 7and EGFr  3 to 5 days before the visit. Need appt for early Novemebr

## 2018-03-07 ENCOUNTER — Encounter (HOSPITAL_COMMUNITY)
Admission: RE | Disposition: A | Discharge status: Home / Self Care | Payer: Self-pay | Source: Ambulatory Visit | Attending: Internal Medicine

## 2018-03-07 ENCOUNTER — Ambulatory Visit (HOSPITAL_COMMUNITY)
Admission: RE | Admit: 2018-03-07 | Discharge: 2018-03-07 | Disposition: A | Discharge status: Home / Self Care | Payer: Medicare Other | Source: Ambulatory Visit | Attending: Internal Medicine | Admitting: Internal Medicine

## 2018-03-07 DIAGNOSIS — Z79890 Hormone replacement therapy (postmenopausal): Secondary | ICD-10-CM | POA: Diagnosis not present

## 2018-03-07 DIAGNOSIS — E785 Hyperlipidemia, unspecified: Secondary | ICD-10-CM | POA: Insufficient documentation

## 2018-03-07 DIAGNOSIS — Z87891 Personal history of nicotine dependence: Secondary | ICD-10-CM | POA: Insufficient documentation

## 2018-03-07 DIAGNOSIS — Z7982 Long term (current) use of aspirin: Secondary | ICD-10-CM | POA: Diagnosis not present

## 2018-03-07 DIAGNOSIS — H409 Unspecified glaucoma: Secondary | ICD-10-CM | POA: Insufficient documentation

## 2018-03-07 DIAGNOSIS — I2510 Atherosclerotic heart disease of native coronary artery without angina pectoris: Secondary | ICD-10-CM | POA: Diagnosis not present

## 2018-03-07 DIAGNOSIS — N183 Chronic kidney disease, stage 3 (moderate): Secondary | ICD-10-CM | POA: Insufficient documentation

## 2018-03-07 DIAGNOSIS — E039 Hypothyroidism, unspecified: Secondary | ICD-10-CM | POA: Insufficient documentation

## 2018-03-07 DIAGNOSIS — Z951 Presence of aortocoronary bypass graft: Secondary | ICD-10-CM | POA: Insufficient documentation

## 2018-03-07 DIAGNOSIS — K922 Gastrointestinal hemorrhage, unspecified: Secondary | ICD-10-CM | POA: Insufficient documentation

## 2018-03-07 DIAGNOSIS — Z79899 Other long term (current) drug therapy: Secondary | ICD-10-CM | POA: Diagnosis not present

## 2018-03-07 DIAGNOSIS — D649 Anemia, unspecified: Secondary | ICD-10-CM

## 2018-03-07 DIAGNOSIS — I129 Hypertensive chronic kidney disease with stage 1 through stage 4 chronic kidney disease, or unspecified chronic kidney disease: Secondary | ICD-10-CM | POA: Insufficient documentation

## 2018-03-07 DIAGNOSIS — D500 Iron deficiency anemia secondary to blood loss (chronic): Secondary | ICD-10-CM | POA: Diagnosis not present

## 2018-03-07 DIAGNOSIS — D509 Iron deficiency anemia, unspecified: Secondary | ICD-10-CM | POA: Insufficient documentation

## 2018-03-07 DIAGNOSIS — K921 Melena: Secondary | ICD-10-CM

## 2018-03-07 HISTORY — PX: GIVENS CAPSULE STUDY: SHX5432

## 2018-03-07 SURGERY — IMAGING PROCEDURE, GI TRACT, INTRALUMINAL, VIA CAPSULE

## 2018-03-07 NOTE — H&P (Signed)
[name] [name] is an 77 y.o. female.   Chief Complaint: Patient is here for small bowel given capsule study. HPI: Patient is 77 year old Afro-American female who was hospitalized last week with melena and anemia and received 2 units of PRBCs.  She previously has been evaluated for iron deficiency anemia and was felt to be bleeding from small bowel GI angiodysplasia.   She underwent esophagogastroduodenoscopy and no bleeding lesion was identified.  Patient was therefore advised to return for outpatient small bowel given capsule study. Patient is on low-dose aspirin.  Past Medical History:  Diagnosis Date  . 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 Carotid artery disease ([place])   . CKD (chronic kidney disease), stage III ([place])   . Essential hypertension   . Glaucoma    Laser surgery 2010  . Hyperlipidemia   . Hypothyroidism   . Iron deficiency anemia   . Obesity     Past Surgical History:  Procedure Laterality Date  . COLONOSCOPY N/A 02/04/2013   Procedure: COLONOSCOPY;  Surgeon: Rogene Houston, MD;  [facility];  Service: Endoscopy;  Laterality: N/A;  1015  . COLONOSCOPY N/A 12/21/2016   Procedure: COLONOSCOPY;  Surgeon: Rogene Houston, MD;  Location: [facility];  Service: Endoscopy;  Laterality: N/A;  . CORONARY ARTERY BYPASS GRAFT  October 2005   Dr. Roxy Manns - LIMA to LAD, SVG to OM 2 and OM 3, SVG to PDA  . ESOPHAGOGASTRODUODENOSCOPY N/A 12/21/2016   Procedure: ESOPHAGOGASTRODUODENOSCOPY (EGD);  Surgeon: Rogene Houston, MD;  [facility]: [facility];  Service: Endoscopy;  Laterality: N/A;  11:10  . ESOPHAGOGASTRODUODENOSCOPY N/A 02/27/2018   Procedure: ESOPHAGOGASTRODUODENOSCOPY (EGD);  Surgeon: Rogene Houston, MD;  [facility]: [facility];  Service: Endoscopy;  Laterality: N/A;  . EYE SURGERY  2010   laser treatment to both eyes for glaucoma    Family History  Problem Relation Age of Onset  . Aneurysm Mother   . Hypertension  Mother   . Coronary artery disease Mother   . Coronary artery disease Father   . Diabetes Son   . Pancreatic cancer Son   . Colon cancer Brother 28       lived 18 months afterwards  . Liver cancer Brother 52  . Diabetes Sister   . Coronary artery disease Sister   . Diverticulosis Sister   . COPD Sister   . Kidney disease Sister   . Diabetes Sister   . Hypertension Sister   . Breast cancer Sister 50  . Hearing loss Sister   . Heart Problems Sister        pacemaker   Social History:  reports that she quit smoking about 14 years ago. Her smoking use included cigarettes. She has a 10.00 pack-year smoking history. She has never used smokeless tobacco. She reports that she does not drink alcohol or use drugs.  Allergies:  Allergies  Allergen Reactions  . Clonidine Derivatives Other (See Comments)    Zombie BP dropped    Medications Prior to Admission  Medication Sig Dispense Refill  . acetaminophen (TYLENOL) 500 MG tablet Take 1,000 mg by mouth every 6 (six) hours as needed for mild pain or headache.     . albuterol (PROAIR HFA) 108 (90 Base) MCG/ACT inhaler INHALE ONE PUFF EVERY FOUR HOURS AS NEEDED. 8.5 g 3  . amLODipine (NORVASC) 5 MG tablet TAKE ONE TABLET BY MOUTH DAILY. 90 tablet 1  . aspirin 81  MG tablet Take 1 tablet (81 mg total) by mouth daily. Restart on 03/02/18 30 tablet 0  . cetirizine (ZYRTEC) 10 MG tablet TAKE ONE TABLET BY MOUTH DAILY. 90 tablet 1  . Cholecalciferol (VITAMIN D3) 1000 UNITS CAPS Take 1 capsule by mouth daily.    . fish oil-omega-3 fatty acids 1000 MG capsule Take 1 g by mouth daily.     . fluticasone (FLONASE) 50 MCG/ACT nasal spray USE 2 SPRAYS IN EACH NOSTRIL ONCE DAILY.  SHAKE GENTLY BEFORE USING. (Patient taking differently: USE 1 SPRAYS IN EACH NOSTRIL ONCE DAILY AS NEEDED FOR ALLERGIES.  SHAKE GENTLY BEFORE USING.) 48 g 1  . hydrALAZINE (APRESOLINE) 25 MG tablet TAKE ONE TABLET BY MOUTH TWICE DAILY. 180 tablet 1  . latanoprost (XALATAN) 0.005 %  ophthalmic solution Place 1 drop into both eyes at bedtime. One drop each eye at bedtime     . levothyroxine (SYNTHROID, LEVOTHROID) 75 MCG tablet TAKE ONE TABLET BY MOUTH DAILY 90 tablet 0  . metoprolol succinate (TOPROL-XL) 100 MG 24 hr tablet TAKE ONE TABLET BY MOUTH DAILY. TAKE WITH OR IMMEDIATELY FOLLOWING A MEAL. 90 tablet 0  . Misc. Devices (WALL GRAB BAR) MISC Grab bar for shower x 2 DX high fall risk 2 each 0  . Multiple Vitamins-Minerals (THERATRUM COMPLETE 50 PLUS PO) Take 1 tablet by mouth daily.     . nitroGLYCERIN (NITROSTAT) 0.4 MG SL tablet Place 1 tablet (0.4 mg total) under the tongue every 5 (five) minutes as needed for chest pain. 25 tablet 3  . omeprazole (PRILOSEC) 40 MG capsule TAKE ONE CAPSULE BY MOUTH DAILY. 90 capsule 1  . omeprazole (PRILOSEC) 40 MG capsule Take 1 capsule (40 mg total) by mouth daily. 30 capsule 3  . Polyethyl Glycol-Propyl Glycol (SYSTANE ULTRA OP) Apply 1 drop to eye as needed (dry eyes).    . rosuvastatin (CRESTOR) 10 MG tablet Take 1 tablet (10 mg total) by mouth daily. 90 tablet 3    No results found for this or any previous visit (from the past 48 hour(s)). No results found.  ROS  There were no vitals taken for this visit. Physical Exam   Assessment/Plan Iron deficiency anemia secondary to GI bleed suspected to be from small bowel source.  Recent presentation with melena and anemia requiring 2 units of PRBCs and EGD was negative Patient is here for small bowel given capsule study.   Hildred Laser, MD 03/07/2018, 3:04 PM

## 2018-03-10 NOTE — Op Note (Signed)
Small Bowel Givens Capsule Study Procedure date: 12/05/2017  Referring Provider:  Orson Eva, DO PCP:  Dr. Moshe Cipro, Norwood Levo, MD  Indication for procedure:    Patient is 77 year old African-American female who has history of iron deficiency anemia due to chronic GI blood loss.  She has been previously evaluated.  She had colonoscopy in June 2018.  She was recently hospitalized for melena and anemia.  She received 2 units of PRBCs.  EGD was unremarkable.  She was therefore scheduled for outpatient small bowel given capsule study.  P.o. iron is on hold.  Findings:    Patient was able to swallow given capsule without any difficulty. Given capsule did not reach cecum during the study. Brunner's gland hypertrophy with focal speck of blood coffee-ground. No other mucosal abnormalities noted to small bowel mucosa.   First Gastric image: 1 minute and 16 seconds First Duodenal image: 7 minutes and 16 seconds First Ileo-Cecal Valve image: Cecum not reached. First Cecal image: Cecum not reached. Gastric Passage time: 6 minutes Small Bowel Passage time: Small bowel passage time cannot be calculated as given capsule did not reach the colon.  Summary & Recommendations:  Incomplete study as small bowel given capsule did not reach cecum. No bleeding lesions identified involving small bowel mucosa that was examined. Results given to patient. Patient advised to go back on p.o. iron/Flintstones 1 to 2 tablets daily. Patient will resume low-dose aspirin as before. Patient to have follow-up CBC by Dr. Moshe Cipro in 8 weeks. She will call office if she has melena or rectal bleeding.

## 2018-03-11 DIAGNOSIS — K921 Melena: Secondary | ICD-10-CM | POA: Diagnosis not present

## 2018-03-11 DIAGNOSIS — K111 Hypertrophy of salivary gland: Secondary | ICD-10-CM | POA: Diagnosis not present

## 2018-03-11 DIAGNOSIS — D500 Iron deficiency anemia secondary to blood loss (chronic): Secondary | ICD-10-CM | POA: Diagnosis not present

## 2018-03-12 ENCOUNTER — Encounter (HOSPITAL_COMMUNITY): Payer: Self-pay | Admitting: Internal Medicine

## 2018-03-20 ENCOUNTER — Other Ambulatory Visit: Payer: Self-pay | Admitting: Family Medicine

## 2018-03-24 ENCOUNTER — Other Ambulatory Visit: Payer: Self-pay | Admitting: Family Medicine

## 2018-04-30 DIAGNOSIS — E785 Hyperlipidemia, unspecified: Secondary | ICD-10-CM | POA: Diagnosis not present

## 2018-04-30 DIAGNOSIS — I10 Essential (primary) hypertension: Secondary | ICD-10-CM | POA: Diagnosis not present

## 2018-04-30 DIAGNOSIS — E559 Vitamin D deficiency, unspecified: Secondary | ICD-10-CM | POA: Diagnosis not present

## 2018-05-01 LAB — LIPID PANEL
CHOLESTEROL: 166 mg/dL (ref <200)
HDL: 59 mg/dL (ref >50)
LDL Cholesterol (Calc): 86 mg/dL (calc)
Non-HDL Cholesterol (Calc): 107 mg/dL (calc) (ref <130)
Total CHOL/HDL Ratio: 2.8 (calc) (ref <5.0)
Triglycerides: 118 mg/dL (ref <150)

## 2018-05-01 LAB — COMPLETE METABOLIC PANEL WITH GFR
AG RATIO: 1.6 (calc) (ref 1.0–2.5)
ALBUMIN MSPROF: 4.5 g/dL (ref 3.6–5.1)
ALT: 11 U/L (ref 6–29)
AST: 20 U/L (ref 10–35)
Alkaline phosphatase (APISO): 74 U/L (ref 33–130)
BUN/Creatinine Ratio: 16 (calc) (ref 6–22)
BUN: 29 mg/dL — ABNORMAL HIGH (ref 7–25)
CALCIUM: 10.2 mg/dL (ref 8.6–10.4)
CO2: 24 mmol/L (ref 20–32)
Chloride: 106 mmol/L (ref 98–110)
Creat: 1.85 mg/dL — ABNORMAL HIGH (ref 0.60–0.93)
GFR, EST AFRICAN AMERICAN: 30 mL/min/{1.73_m2} — AB (ref >=60)
GFR, EST NON AFRICAN AMERICAN: 26 mL/min/{1.73_m2} — AB (ref >=60)
GLOBULIN: 2.9 g/dL (ref 1.9–3.7)
Glucose, Bld: 89 mg/dL (ref 65–99)
POTASSIUM: 5.3 mmol/L (ref 3.5–5.3)
SODIUM: 141 mmol/L (ref 135–146)
TOTAL PROTEIN: 7.4 g/dL (ref 6.1–8.1)
Total Bilirubin: 0.3 mg/dL (ref 0.2–1.2)

## 2018-05-01 LAB — CBC
HCT: 28.1 % — ABNORMAL LOW (ref 35.0–45.0)
Hemoglobin: 9.3 g/dL — ABNORMAL LOW (ref 11.7–15.5)
MCH: 27.6 pg (ref 27.0–33.0)
MCHC: 33.1 g/dL (ref 32.0–36.0)
MCV: 83.4 fL (ref 80.0–100.0)
MPV: 11.6 fL (ref 7.5–12.5)
Platelets: 232 10*3/uL (ref 140–400)
RBC: 3.37 10*6/uL — ABNORMAL LOW (ref 3.80–5.10)
RDW: 18.4 % — AB (ref 11.0–15.0)
WBC: 6.7 10*3/uL (ref 3.8–10.8)

## 2018-05-01 LAB — TSH: TSH: 5.84 m[IU]/L — AB (ref 0.40–4.50)

## 2018-05-01 LAB — VITAMIN D 25 HYDROXY (VIT D DEFICIENCY, FRACTURES): VIT D 25 HYDROXY: 57 ng/mL (ref 30–100)

## 2018-05-06 DIAGNOSIS — E119 Type 2 diabetes mellitus without complications: Secondary | ICD-10-CM | POA: Diagnosis not present

## 2018-05-06 DIAGNOSIS — H40053 Ocular hypertension, bilateral: Secondary | ICD-10-CM | POA: Diagnosis not present

## 2018-05-06 DIAGNOSIS — H2513 Age-related nuclear cataract, bilateral: Secondary | ICD-10-CM | POA: Diagnosis not present

## 2018-05-06 DIAGNOSIS — H04123 Dry eye syndrome of bilateral lacrimal glands: Secondary | ICD-10-CM | POA: Diagnosis not present

## 2018-05-07 ENCOUNTER — Encounter: Payer: Self-pay | Admitting: Family Medicine

## 2018-05-07 ENCOUNTER — Ambulatory Visit (INDEPENDENT_AMBULATORY_CARE_PROVIDER_SITE_OTHER): Payer: Medicare Other | Admitting: Family Medicine

## 2018-05-07 VITALS — BP 178/56 | HR 59 | Resp 12 | Ht 61.0 in | Wt 194.1 lb

## 2018-05-07 DIAGNOSIS — D649 Anemia, unspecified: Secondary | ICD-10-CM

## 2018-05-07 DIAGNOSIS — E038 Other specified hypothyroidism: Secondary | ICD-10-CM | POA: Diagnosis not present

## 2018-05-07 DIAGNOSIS — I10 Essential (primary) hypertension: Secondary | ICD-10-CM | POA: Diagnosis not present

## 2018-05-07 DIAGNOSIS — R7302 Impaired glucose tolerance (oral): Secondary | ICD-10-CM | POA: Diagnosis not present

## 2018-05-07 MED ORDER — LEVOTHYROXINE SODIUM 75 MCG PO TABS: ORAL_TABLET | ORAL | 3 refills | Status: DC

## 2018-05-07 MED ORDER — HYDRALAZINE HCL 25 MG PO TABS: 25.0000 mg | ORAL_TABLET | Freq: Three times a day (TID) | ORAL | 3 refills | Status: DC

## 2018-05-07 NOTE — Patient Instructions (Addendum)
F/U in January as before, call if you need me before  Labs are good, no changes in cholesterol medication, continue to avoid sugar as much as possible  Increase in dose of synthroid to one and a half tablet every Monday, Wednesday, and Friday, and continue one tablet on Tuesday , Thursday , Saturday and Sunday  Increase hydralazine to one every 8 hours, 7am, 3 pm and and 11pm   TSH, chem 7 and EGFR, cbC and iron and ferritin 1 week before follow up  Thank you  for choosing  Primary Care. We consider it a privelige to serve you.  Delivering excellent health care in a caring and  compassionate way is our goal.  Partnering with you,  so that together we can achieve this goal is our strategy.

## 2018-05-19 ENCOUNTER — Other Ambulatory Visit: Payer: Self-pay | Admitting: Family Medicine

## 2018-05-31 NOTE — Progress Notes (Signed)
[name] [name]     MRN: [id number]      DOB: [birth year]   HPI [name] [name] is here for follow up and re-evaluation of chronic medical conditions, medication management and review of any available recent lab and radiology data.  Preventive health is updated, specifically  Cancer screening and Immunization.   Questions or concerns regarding consultations or procedures which the PT has had in the interim are  addressed. The PT denies any adverse reactions to current medications since the last visit.  There are no new concerns.  There are no specific complaints  Denies polyuria, polydipsia, blurred vision , s.   ROS Denies recent fever or chills. Denies sinus pressure, nasal congestion, ear pain or sore throat. Denies chest congestion, productive cough or wheezing. Denies chest pains, palpitations and leg swelling Denies abdominal pain, nausea, vomiting,diarrhea or constipation.   Denies dysuria, frequency, hesitancy or incontinence. Denies uncontrolled  joint pain, swelling and limitation in mobility. Denies headaches, seizures, numbness, or tingling. Denies depression, anxiety or insomnia. Denies skin break down or rash.   PE  BP (!) 178/56 (BP Location: Right Arm, Patient Position: Sitting, Cuff Size: Large)   Pulse (!) 59   Resp 12   Ht 5\' 1"  (1.549 m)   Wt 194 lb 1.3 oz (88 kg)   SpO2 95% Comment: room air  BMI 36.67 kg/m   Patient alert and oriented and in no cardiopulmonary distress.  HEENT: No facial asymmetry, EOMI,   oropharynx pink and moist.  Neck supple no JVD, no mass.  Chest: Clear to auscultation bilaterally.  CVS: S1, S2 no murmurs, no S3.Regular rate.  ABD: Soft non tender.   Ext: No edema  MS: decreased  ROM spine, shoulders, hips and knees.  Skin: Intact, no ulcerations or rash noted.  Psych: Good eye contact, normal affect. Memory intact not anxious or depressed appearing.  CNS: CN 2-12 intact, power,  normal throughout.no focal deficits  noted.   Assessment & Plan  Essential hypertension, malignant Uncontrolled, increase dose of hydralazine DASH diet and commitment to daily physical activity for a minimum of 30 minutes discussed and encouraged, as a part of hypertension management. The importance of attaining a healthy weight is also discussed.  BP/Weight 05/07/2018 03/04/2018 02/27/2018 02/26/2018 01/22/2018 11/08/2017 5/80/9983  Systolic BP 382 505 397 - 673 419 379  Diastolic BP 56 78 51 - 68 74 60  Wt. (Lbs) 194.08 193 - 190 191 193 197  BMI 36.67 36.47 - 35.9 36.09 36.47 37.22       Morbid obesity Deteriorated. Patient re-educated about  the importance of commitment to a  minimum of 150 minutes of exercise per week.  The importance of healthy food choices with portion control discussed. Encouraged to start a food diary, count calories and to consider  joining a support group. Sample diet sheets offered. Goals set by the patient for the next several months.   Weight /BMI 05/07/2018 03/04/2018 02/26/2018  WEIGHT 194 lb 1.3 oz 193 lb 190 lb  HEIGHT 5\' 1"  5\' 1"  5\' 1"   BMI 36.67 kg/m2 36.47 kg/m2 35.9 kg/m2      Hypothyroidism Inadequately controlled, medication adjustment made Updated lab needed at/ before next visit.   IGT (impaired glucose tolerance) Patient educated about the importance of limiting  Carbohydrate intake , the need to commit to daily physical activity for a minimum of 30 minutes , and to commit weight loss. The fact that changes in all these areas will reduce or eliminate  all together the development of diabetes is stressed.  Corrected with dietaary change  And weight loss which is excellent  Diabetic Labs Latest Ref Rng & Units 04/30/2018 03/04/2018 02/27/2018 02/26/2018 02/25/2018  HbA1c <5.7 % of total Hgb - - - - 5.5  Microalbumin 0.00 - 1.89 mg/dL - - - - -  Micro/Creat Ratio 0.0 - 30.0 mg/g - - - - -  Chol <200 mg/dL 166 - - - 118  HDL >50 mg/dL 59 - - - 45(L)  Calc LDL mg/dL (calc) 86 - -  - 56  Triglycerides <150 mg/dL 118 - - - 91  Creatinine 0.60 - 0.93 mg/dL 1.85(H) 1.76(H) 1.74(H) 1.92(H) 2.00(H)   BP/Weight 05/07/2018 03/04/2018 02/27/2018 02/26/2018 01/22/2018 11/08/2017 07/05/1279  Systolic BP 188 677 373 - 668 159 470  Diastolic BP 56 78 51 - 68 74 60  Wt. (Lbs) 194.08 193 - 190 191 193 197  BMI 36.67 36.47 - 35.9 36.09 36.47 37.22   Foot/eye exam completion dates Latest Ref Rng & Units 04/12/2016 10/05/2014  Eye Exam No Retinopathy No Retinopathy No Retinopathy  Foot exam Order - - -  Foot Form Completion - - -

## 2018-05-31 NOTE — Assessment & Plan Note (Signed)
Deteriorated. Patient re-educated about  the importance of commitment to a  minimum of 150 minutes of exercise per week.  The importance of healthy food choices with portion control discussed. Encouraged to start a food diary, count calories and to consider  joining a support group. Sample diet sheets offered. Goals set by the patient for the next several months.   Weight /BMI 05/07/2018 03/04/2018 02/26/2018  WEIGHT 194 lb 1.3 oz 193 lb 190 lb  HEIGHT 5\' 1"  5\' 1"  5\' 1"   BMI 36.67 kg/m2 36.47 kg/m2 35.9 kg/m2

## 2018-05-31 NOTE — Assessment & Plan Note (Signed)
Uncontrolled, increase dose of hydralazine DASH diet and commitment to daily physical activity for a minimum of 30 minutes discussed and encouraged, as a part of hypertension management. The importance of attaining a healthy weight is also discussed.  BP/Weight 05/07/2018 03/04/2018 02/27/2018 02/26/2018 01/22/2018 11/08/2017 8/88/7579  Systolic BP 728 206 015 - 615 379 432  Diastolic BP 56 78 51 - 68 74 60  Wt. (Lbs) 194.08 193 - 190 191 193 197  BMI 36.67 36.47 - 35.9 36.09 36.47 37.22

## 2018-05-31 NOTE — Assessment & Plan Note (Signed)
Inadequately controlled, medication adjustment made Updated lab needed at/ before next visit.

## 2018-05-31 NOTE — Assessment & Plan Note (Signed)
Patient educated about the importance of limiting  Carbohydrate intake , the need to commit to daily physical activity for a minimum of 30 minutes , and to commit weight loss. The fact that changes in all these areas will reduce or eliminate all together the development of diabetes is stressed.  Corrected with dietaary change  And weight loss which is excellent  Diabetic Labs Latest Ref Rng & Units 04/30/2018 03/04/2018 02/27/2018 02/26/2018 02/25/2018  HbA1c <5.7 % of total Hgb - - - - 5.5  Microalbumin 0.00 - 1.89 mg/dL - - - - -  Micro/Creat Ratio 0.0 - 30.0 mg/g - - - - -  Chol <200 mg/dL 166 - - - 118  HDL >50 mg/dL 59 - - - 45(L)  Calc LDL mg/dL (calc) 86 - - - 56  Triglycerides <150 mg/dL 118 - - - 91  Creatinine 0.60 - 0.93 mg/dL 1.85(H) 1.76(H) 1.74(H) 1.92(H) 2.00(H)   BP/Weight 05/07/2018 03/04/2018 02/27/2018 02/26/2018 01/22/2018 11/08/2017 5/78/4696  Systolic BP 295 284 132 - 440 102 725  Diastolic BP 56 78 51 - 68 74 60  Wt. (Lbs) 194.08 193 - 190 191 193 197  BMI 36.67 36.47 - 35.9 36.09 36.47 37.22   Foot/eye exam completion dates Latest Ref Rng & Units 04/12/2016 10/05/2014  Eye Exam No Retinopathy No Retinopathy No Retinopathy  Foot exam Order - - -  Foot Form Completion - - -

## 2018-06-09 ENCOUNTER — Telehealth: Payer: Self-pay | Admitting: Family Medicine

## 2018-06-09 NOTE — Telephone Encounter (Signed)
Spoke with patient- she understands

## 2018-06-09 NOTE — Telephone Encounter (Signed)
Please call the pt regarding her feet swelling.

## 2018-06-09 NOTE — Telephone Encounter (Signed)
I recommend shhe just prop up her feet to relieve the swelling

## 2018-06-09 NOTE — Telephone Encounter (Signed)
Patient states that when she is up on her feet for a long time, her feet will swell and sometimes be painful, no complaint of redness, or heat in her feet. She says yesterday her right foot/ankle was swollen and a little painful, but this morning she is fine. Wants to know if you think she should be prescribed a fluid pill to take when her feet are swollen? Also says that when she sits down and props them up, the swelling will go down. Please advise

## 2018-06-10 ENCOUNTER — Other Ambulatory Visit: Payer: Self-pay | Admitting: Family Medicine

## 2018-06-10 MED ORDER — TRAMADOL HCL 50 MG PO TABS: ORAL_TABLET | ORAL | 0 refills | Status: DC

## 2018-06-10 NOTE — Telephone Encounter (Signed)
Pls let her know I have sent in tramadol for short term use , if this doers not work , call in by Thursday, I will refer her to Podiatry

## 2018-06-10 NOTE — Telephone Encounter (Signed)
Pt called back this morning stating that the pain in her feet is terrible. Wanted to know if Dr. Moshe Cipro would call her in something to keep them from hurting.

## 2018-06-11 NOTE — Telephone Encounter (Signed)
Advised patient of Dr.Simpson's treatment plan with verbal understanding.

## 2018-06-12 ENCOUNTER — Other Ambulatory Visit: Payer: Self-pay | Admitting: Family Medicine

## 2018-06-12 ENCOUNTER — Encounter: Payer: Self-pay | Admitting: Family Medicine

## 2018-06-12 NOTE — Telephone Encounter (Signed)
Call and advised the patient to go to the emergency room for care and to discontinue the tramadol, I will note an allergy, she is aware and agrees

## 2018-06-12 NOTE — Telephone Encounter (Signed)
Pt called in this morning stating that her foot is still swollen and it is still painful. The tramadol that was sent in is causing her to itch really bad. Wanted to know what Dr. Moshe Cipro wanted to do.

## 2018-06-13 DIAGNOSIS — M79671 Pain in right foot: Secondary | ICD-10-CM | POA: Diagnosis not present

## 2018-06-20 DIAGNOSIS — M79671 Pain in right foot: Secondary | ICD-10-CM | POA: Diagnosis not present

## 2018-07-07 ENCOUNTER — Other Ambulatory Visit: Payer: Self-pay | Admitting: Family Medicine

## 2018-07-22 DIAGNOSIS — E038 Other specified hypothyroidism: Secondary | ICD-10-CM | POA: Diagnosis not present

## 2018-07-22 DIAGNOSIS — D649 Anemia, unspecified: Secondary | ICD-10-CM | POA: Diagnosis not present

## 2018-07-22 DIAGNOSIS — I10 Essential (primary) hypertension: Secondary | ICD-10-CM | POA: Diagnosis not present

## 2018-07-22 LAB — BASIC METABOLIC PANEL WITH GFR
BUN/Creatinine Ratio: 16 (calc) (ref 6–22)
BUN: 22 mg/dL (ref 7–25)
CALCIUM: 10.7 mg/dL — AB (ref 8.6–10.4)
CO2: 27 mmol/L (ref 20–32)
Chloride: 109 mmol/L (ref 98–110)
Creat: 1.38 mg/dL — ABNORMAL HIGH (ref 0.60–0.93)
GFR, EST AFRICAN AMERICAN: 43 mL/min/{1.73_m2} — AB (ref >=60)
GFR, Est Non African American: 37 mL/min/{1.73_m2} — ABNORMAL LOW (ref >=60)
GLUCOSE: 91 mg/dL (ref 65–139)
Potassium: 5.3 mmol/L (ref 3.5–5.3)
Sodium: 144 mmol/L (ref 135–146)

## 2018-07-22 LAB — IRON: IRON: 27 ug/dL — AB (ref 45–160)

## 2018-07-22 LAB — CBC
HCT: 26.2 % — ABNORMAL LOW (ref 35.0–45.0)
HEMOGLOBIN: 8.4 g/dL — AB (ref 11.7–15.5)
MCH: 28.1 pg (ref 27.0–33.0)
MCHC: 32.1 g/dL (ref 32.0–36.0)
MCV: 87.6 fL (ref 80.0–100.0)
MPV: 11.2 fL (ref 7.5–12.5)
PLATELETS: 295 10*3/uL (ref 140–400)
RBC: 2.99 10*6/uL — AB (ref 3.80–5.10)
RDW: 13.3 % (ref 11.0–15.0)
WBC: 6.6 10*3/uL (ref 3.8–10.8)

## 2018-07-22 LAB — FERRITIN: FERRITIN: 29 ng/mL (ref 16–288)

## 2018-07-22 LAB — TSH: TSH: 0.99 m[IU]/L (ref 0.40–4.50)

## 2018-07-29 ENCOUNTER — Telehealth: Payer: Self-pay | Admitting: Family Medicine

## 2018-07-29 ENCOUNTER — Ambulatory Visit (INDEPENDENT_AMBULATORY_CARE_PROVIDER_SITE_OTHER): Payer: Medicare Other | Admitting: Family Medicine

## 2018-07-29 ENCOUNTER — Encounter: Payer: Self-pay | Admitting: Family Medicine

## 2018-07-29 VITALS — BP 160/70 | HR 71 | Resp 16 | Ht 61.0 in | Wt 194.0 lb

## 2018-07-29 DIAGNOSIS — E790 Hyperuricemia without signs of inflammatory arthritis and tophaceous disease: Secondary | ICD-10-CM

## 2018-07-29 DIAGNOSIS — E785 Hyperlipidemia, unspecified: Secondary | ICD-10-CM

## 2018-07-29 DIAGNOSIS — N183 Chronic kidney disease, stage 3 unspecified: Secondary | ICD-10-CM

## 2018-07-29 DIAGNOSIS — I10 Essential (primary) hypertension: Secondary | ICD-10-CM

## 2018-07-29 DIAGNOSIS — E038 Other specified hypothyroidism: Secondary | ICD-10-CM

## 2018-07-29 DIAGNOSIS — D649 Anemia, unspecified: Secondary | ICD-10-CM | POA: Diagnosis not present

## 2018-07-29 DIAGNOSIS — D508 Other iron deficiency anemias: Secondary | ICD-10-CM

## 2018-07-29 DIAGNOSIS — R7302 Impaired glucose tolerance (oral): Secondary | ICD-10-CM

## 2018-07-29 NOTE — Telephone Encounter (Signed)
Please advise 

## 2018-07-29 NOTE — Telephone Encounter (Signed)
Nothing  Except tylenol to be used

## 2018-07-29 NOTE — Telephone Encounter (Signed)
Pt questioning if you was going to call her something in for swelling in her Right ankle. No pain, just swelling,

## 2018-07-29 NOTE — Patient Instructions (Addendum)
Wellness to be scheduled with Nurse  F/u  with MD in 4.5 months, call if you need me before  Increase iron to TWO daily and keep bowels moving, drink orange juice 1 shot glass size      Fasting lipid, cmp and EGFR,, and TSH, CBC , iron and ferritin  In 4.5 months , 1 week before visit  Kidneys look better , we will be in touch er transportation cost to [place] and find you a Doc there if the cost is oK to get ( nurse pls help with this we will discuss) there   Thanks for choosing [facility], we consider it a privelige to serve you.

## 2018-07-31 NOTE — Telephone Encounter (Signed)
Pt aware.

## 2018-08-04 ENCOUNTER — Telehealth: Payer: Self-pay

## 2018-08-04 ENCOUNTER — Other Ambulatory Visit: Payer: Self-pay | Admitting: *Deleted

## 2018-08-04 ENCOUNTER — Other Ambulatory Visit: Payer: Self-pay | Admitting: Family Medicine

## 2018-08-04 ENCOUNTER — Telehealth: Payer: Self-pay | Admitting: Family Medicine

## 2018-08-04 DIAGNOSIS — E790 Hyperuricemia without signs of inflammatory arthritis and tophaceous disease: Secondary | ICD-10-CM | POA: Insufficient documentation

## 2018-08-04 DIAGNOSIS — M109 Gout, unspecified: Secondary | ICD-10-CM | POA: Insufficient documentation

## 2018-08-04 NOTE — Assessment & Plan Note (Signed)
Needs to be followed by Nephrology, if unable to afford cost of going to [place] pt will keep her care nI [facility] reportedly

## 2018-08-04 NOTE — Assessment & Plan Note (Signed)
NEEDS TO TAKE IRON REGULARLY. HAS BENEFITED FROM PROCRIT BUT FINDS COST PROHIBITIVE REPORTEDLY , WILL REFER TO NEW NEPHROLOGIST ONCE THE PT STATES SHE CAN AFFORD TRANSPORT COST

## 2018-08-04 NOTE — Progress Notes (Signed)
[name] [name]     MRN: [id number]      DOB: 1940-08-28   HPI [name] [name] is here for follow up and re-evaluation of chronic medical conditions, medication management and review of any available recent lab and radiology data.  Preventive health is updated, specifically  Cancer screening and Immunization.   The PT denies any adverse reactions to current medications since the last visit.  Recently had gout flare , recommendation was that she be started on agent to lower her uric acid level, due to her  Severe CKD I have avoided this to date.re educate re foods to avoid that trigger f;lares, and get assistance from Nephrology re best approach once she has established She repeats being "unconfortable " with local Nephrologists and states cost of transport to [place] is prohibitive, we will check cost and get back to her as I am unaware of high cost , may need [facility] involvement   ROS Denies recent fever or chills. Denies sinus pressure, nasal congestion, ear pain or sore throat. Denies chest congestion, productive cough or wheezing. Denies chest pains, palpitations and leg swelling Denies abdominal pain, nausea, vomiting,diarrhea or constipation.   Denies dysuria, frequency, hesitancy or incontinence.  Denies headaches, seizures, numbness, or tingling. Denies depression, anxiety or insomnia. Denies skin break down or rash.   PE  BP (!) 160/70   Pulse 71   Resp 16   Ht 5\' 1"  (1.549 m)   Wt 194 lb (88 kg)   SpO2 95%   BMI 36.66 kg/m   Patient alert and oriented and in no cardiopulmonary distress.  HEENT: No facial asymmetry, EOMI,   oropharynx pink and moist.  Neck supple no JVD, no mass.  Chest: Clear to auscultation bilaterally.  CVS: S1, S2 no murmurs, no S3.Regular rate.  ABD: Soft non tender.   Ext: No edema  MS: Adequate though reduced  ROM spine, shoulders, hips and knees.  Skin: Intact, no ulcerations or rash noted.  Psych: Good eye contact, normal affect.  Memory intact not anxious or depressed appearing.  CNS: CN 2-12 intact, power,  normal throughout.no focal deficits noted.   Assessment & Plan  Essential hypertension, malignant Elevated  SBP , but adequately controlled for this pateint on multiple medicATIONS DASH diet and commitment to daily physical activity for a minimum of 30 minutes discussed and encouraged, as a part of hypertension management. The importance of attaining a healthy weight is also discussed.  BP/Weight 07/29/2018 05/07/2018 03/04/2018 02/27/2018 02/26/2018 01/22/2018 3/53/2992  Systolic BP 426 834 196 222 - 979 892  Diastolic BP 70 56 78 51 - 68 74  Wt. (Lbs) 194 194.08 193 - 190 191 193  BMI 36.66 36.67 36.47 - 35.9 36.09 36.47       Morbid obesity UNCHANGED.HYPOTHYROIDISM, OBESITY ASSOCIATED WITH HYPERTENSION, GOUT . Patient re-educated about  the importance of commitment to a  minimum of 150 minutes of exercise per week.  The importance of healthy food choices with portion control discussed.  Goals set by the patient for the next several months.   Weight /BMI 07/29/2018 05/07/2018 03/04/2018  WEIGHT 194 lb 194 lb 1.3 oz 193 lb  HEIGHT 5\' 1"  5\' 1"  5\' 1"   BMI 36.66 kg/m2 36.67 kg/m2 36.47 kg/m2      Hypothyroidism Controlled, no change in medication   Hyperlipidemia LDL goal <70 Hyperlipidemia:Low fat diet discussed and encouraged.   Lipid Panel  Lab Results  Component Value Date   CHOL 166 04/30/2018   HDL  59 04/30/2018   LDLCALC 86 04/30/2018   TRIG 118 04/30/2018   CHOLHDL 2.8 04/30/2018   Updated lab needed PRIOR TO NEXT VISIT Controlled, no change in medication .     IGT (impaired glucose tolerance) Patient educated about the importance of limiting  Carbohydrate intake , the need to commit to daily physical activity for a minimum of 30 minutes , and to commit weight loss. The fact that changes in all these areas will reduce or eliminate all together the development of diabetes is  stressed.   Diabetic Labs Latest Ref Rng & Units 07/22/2018 04/30/2018 03/04/2018 02/27/2018 02/26/2018  HbA1c <5.7 % of total Hgb - - - - -  Microalbumin 0.00 - 1.89 mg/dL - - - - -  Micro/Creat Ratio 0.0 - 30.0 mg/g - - - - -  Chol <200 mg/dL - 166 - - -  HDL >50 mg/dL - 59 - - -  Calc LDL mg/dL (calc) - 86 - - -  Triglycerides <150 mg/dL - 118 - - -  Creatinine 0.60 - 0.93 mg/dL 1.38(H) 1.85(H) 1.76(H) 1.74(H) 1.92(H)   BP/Weight 07/29/2018 05/07/2018 03/04/2018 02/27/2018 02/26/2018 01/22/2018 3/83/8184  Systolic BP 037 543 606 770 - 340 352  Diastolic BP 70 56 78 51 - 68 74  Wt. (Lbs) 194 194.08 193 - 190 191 193  BMI 36.66 36.67 36.47 - 35.9 36.09 36.47   Foot/eye exam completion dates Latest Ref Rng & Units 04/12/2016 10/05/2014  Eye Exam No Retinopathy No Retinopathy No Retinopathy  Foot exam Order - - -  Foot Form Completion - - -      IDA (iron deficiency anemia) NEEDS TO TAKE IRON REGULARLY. HAS BENEFITED FROM PROCRIT BUT FINDS COST PROHIBITIVE REPORTEDLY , WILL REFER TO NEW NEPHROLOGIST ONCE THE PT STATES SHE CAN AFFORD TRANSPORT COST  CKD (chronic kidney disease) Needs to be followed by Nephrology, if unable to afford cost of going to [place] pt will keep her care nI [facility] reportedly  Hyperuricemia Symptomatic , with recurrent gout, needs info on diet and lifestyle to help to lower recurrence, will get Nephrology input re best agent to use, need to follow up

## 2018-08-04 NOTE — Telephone Encounter (Signed)
Please call [place] transport and [facility] and find out cost of transporting pt from [place] to [place] for medical exam  OR ask [place] social worker to provide info on availability and cost  of transport for pt to [place] from [facility].  As far as I am aware , the cost is not prohibitive, but pt voiced concerns re high cost she quoted and she wants to Nephrology in [place] rather [facility] where she has been going to, and she DOES need to continue to be followed by Nephrology. Please send feedback once obtained. Also please print  And mail to pt , letting hr know that info on her way re lifestyle esp dietary approach to lowering uric acid level therefore gout recurrence  ?? pls ask!

## 2018-08-04 NOTE — Assessment & Plan Note (Signed)
Symptomatic , with recurrent gout, needs info on diet and lifestyle to help to lower recurrence, will get Nephrology input re best agent to use, need to follow up

## 2018-08-04 NOTE — Telephone Encounter (Signed)
Referral to [facility] for affordable transportation to [facility] nephrology sent

## 2018-08-04 NOTE — Assessment & Plan Note (Signed)
Controlled, no change in medication  

## 2018-08-04 NOTE — Assessment & Plan Note (Signed)
Elevated  SBP , but adequately controlled for this pateint on multiple medicATIONS DASH diet and commitment to daily physical activity for a minimum of 30 minutes discussed and encouraged, as a part of hypertension management. The importance of attaining a healthy weight is also discussed.  BP/Weight 07/29/2018 05/07/2018 03/04/2018 02/27/2018 02/26/2018 01/22/2018 0/41/5930  Systolic BP 123 799 094 000 - 505 678  Diastolic BP 70 56 78 51 - 68 74  Wt. (Lbs) 194 194.08 193 - 190 191 193  BMI 36.66 36.67 36.47 - 35.9 36.09 36.47

## 2018-08-04 NOTE — Telephone Encounter (Signed)
Noted , thank youu

## 2018-08-04 NOTE — Telephone Encounter (Signed)
[facility] will cost $100 round trip from [place] to [employer]. They do not [employer]. Still trying to get someone on the phone with [facility]. [employer] has sent a message to [place] worker to get info about transportation.

## 2018-08-04 NOTE — Telephone Encounter (Signed)
Plus put referral in for nephrology. Patient states if you will put in referral she can find her own way to [place].

## 2018-08-04 NOTE — Telephone Encounter (Signed)
Thank you, I am SURE we will need some help form [facility] with this, I had no idea it was so expensive! I signed off on [facility] referral, [email or web address]

## 2018-08-04 NOTE — Assessment & Plan Note (Signed)
Patient educated about the importance of limiting  Carbohydrate intake , the need to commit to daily physical activity for a minimum of 30 minutes , and to commit weight loss. The fact that changes in all these areas will reduce or eliminate all together the development of diabetes is stressed.   Diabetic Labs Latest Ref Rng & Units 07/22/2018 04/30/2018 03/04/2018 02/27/2018 02/26/2018  HbA1c <5.7 % of total Hgb - - - - -  Microalbumin 0.00 - 1.89 mg/dL - - - - -  Micro/Creat Ratio 0.0 - 30.0 mg/g - - - - -  Chol <200 mg/dL - 166 - - -  HDL >50 mg/dL - 59 - - -  Calc LDL mg/dL (calc) - 86 - - -  Triglycerides <150 mg/dL - 118 - - -  Creatinine 0.60 - 0.93 mg/dL 1.38(H) 1.85(H) 1.76(H) 1.74(H) 1.92(H)   BP/Weight 07/29/2018 05/07/2018 03/04/2018 02/27/2018 02/26/2018 01/22/2018 01/05/6150  Systolic BP 834 373 578 978 - 478 412  Diastolic BP 70 56 78 51 - 68 74  Wt. (Lbs) 194 194.08 193 - 190 191 193  BMI 36.66 36.67 36.47 - 35.9 36.09 36.47   Foot/eye exam completion dates Latest Ref Rng & Units 04/12/2016 10/05/2014  Eye Exam No Retinopathy No Retinopathy No Retinopathy  Foot exam Order - - -  Foot Form Completion - - -

## 2018-08-04 NOTE — Telephone Encounter (Signed)
[facility] has put in a referral for nephrology and transportation. I have printed off gout information for the patient along with diet information and will mail today.

## 2018-08-04 NOTE — Patient Outreach (Signed)
[facility]) Care Management  08/04/2018  [name] [name] 05-13-1941 [id number]   Telephone Screen  Referral Date: 08/04/2018 Referral Source: MD referral Dr Bari Mantis, Francena Hanly LPN Referral Reason: Kidney failure, transportation needs Patient needs help finding affordable transprotation from [place], [place] to [place], [place] to see a nephrologist   Insurance: united health care medicare    Outreach attempt #1 successful to patient but she assumed Cm was a "scammer"  Patient would not verify HIPAA Reviewed and addressed referral to [facility] with patient She and a female who answered stated they did not need assistance and disconnected the line [facility] RN CM called to update Francena Hanly at Dr Moshe Cipro office and to see if pt actually did need assistance. Rodena Piety to contact the patient and to have the patient return a call to CM possible later in the day. Rodena Piety confirms the patient does need assistance with transportation   Plan: [facility] RN CM will pend pt for a possible return call to CM on 08/04/2018  Kimberly L. Lavina Hamman, RN, BSN, [place] Coordinator Office number [phone] Mobile number [phone]  Main [facility] number [phone] Fax number [phone]

## 2018-08-04 NOTE — Assessment & Plan Note (Signed)
UNCHANGED.HYPOTHYROIDISM, OBESITY ASSOCIATED WITH HYPERTENSION, GOUT . Patient re-educated about  the importance of commitment to a  minimum of 150 minutes of exercise per week.  The importance of healthy food choices with portion control discussed.  Goals set by the patient for the next several months.   Weight /BMI 07/29/2018 05/07/2018 03/04/2018  WEIGHT 194 lb 194 lb 1.3 oz 193 lb  HEIGHT 5\' 1"  5\' 1"  5\' 1"   BMI 36.66 kg/m2 36.67 kg/m2 36.47 kg/m2

## 2018-08-04 NOTE — Assessment & Plan Note (Signed)
Hyperlipidemia:Low fat diet discussed and encouraged.   Lipid Panel  Lab Results  Component Value Date   CHOL 166 04/30/2018   HDL 59 04/30/2018   LDLCALC 86 04/30/2018   TRIG 118 04/30/2018   CHOLHDL 2.8 04/30/2018   Updated lab needed PRIOR TO NEXT VISIT Controlled, no change in medication .

## 2018-08-04 NOTE — Patient Outreach (Addendum)
[facility]) Care Management  08/04/2018  [name] [name] Jan 01, 1941 [id number]   Telephone Screen  Referral Date: 08/04/2018 Referral Source: MD referral Dr [name] [name], [name] Hanly LPN Referral Reason: Kidney failure, transportation needs Patient needs help finding affordable transportation from [place], [place] to [place], [place] to see a nephrologist   Insurance: united health care medicare    Outreach attempt # 2 successful to patient  Patient now able verify HIPAA Reviewed and addressed referral to [facility] patient  [name] [name] reports Staff at Dr [name] [name] office wants her to be seen by a nephrologist in [place], [place] [facility])  She confirms she rides the [facility] for all her appointments in [place] but has difficulty getting transportation to other counties.  He informed [facility] RN CM during the assessment that she is not aware of the date, MD name or MD office address. She reports this has not been shared with her.   [facility] RN CM notes a CMA note on 08/04/2018 at 1345 indicating the primary MD office staff may be attempting to put in a nephrology referral for the patient. This note also indicates "Patient states if you will put in referral she can find her own way to [facility] RN CM reviewed primary MD 07/29/2018 note to indicate "She repeats being "unconfortable " with local Nephrologists and states cost of transport to [place] is prohibitive, we will check cost and get back to her as I am unaware of high cost , may need [facility] involvement"  [facility] RN CM reviewed [facility] SW availability to offer options for transportation to [name] [name]. She informed CM she would like the information and to see if it would be helpful to her.   [facility] RN CM assessed [name] [name] for other SW services and she inquired about the cost of living will and POA services CM discussed with her that there is not a charge.  "I will see" was her statement.  [facility] RN completed the assessment for of  services from [place], pharmacy and health coach [name] [name] denied need for [facility] RN CM as she states she is well educated in her medical needs . [name] [name] states she does not need [place] services as she is able to be billed for medications prn and has not had concerns with donut hole issues.  She confirms she has all her medications prescribed for the month of February   [name] [name] lives with her family, husband and is independent in her care needs at this time  Advance directives she does not have a living will or POA at this time and would like to "see" later on if she would need assistance and let [facility] know      Plan [facility] RN CM will refer [name] [name] to [facility] SW for transportation assistance form [place] to [employer] for a pending nephrology appointment per Dr [employer] office  [name] [name] was informed that a [facility] SW staff would call her home, identify themselves as [facility] staff and request she verify her name DOB and address [name] [name] was provided with [facility] RN CM contact number to reach in case she had any further concerns   Bejamin Hackbart L. Lavina Hamman, RN, BSN, [place] Coordinator Office number [phone] Mobile number [phone]  Main [facility] number [phone] Fax number [phone]

## 2018-08-05 ENCOUNTER — Other Ambulatory Visit: Payer: Self-pay

## 2018-08-05 ENCOUNTER — Other Ambulatory Visit: Payer: Self-pay | Admitting: Family Medicine

## 2018-08-05 DIAGNOSIS — N184 Chronic kidney disease, stage 4 (severe): Secondary | ICD-10-CM

## 2018-08-05 NOTE — Patient Outreach (Signed)
[facility]) Care Management  08/05/2018  [name] [name] [birth year] [id number]   Successful outreach to Ms. Weinkauf regarding social work referral for transportation assistance.  It was reported that Ms. Emery is being referred for a Nephrology appointment but does not have transportation to [facility].  BSW contacted [employer] prior to call with Ms. Lechtenberg to confirm transportation benefits.  BSW informed Ms. Pentland that she does have this benefit and they will transport to appointments within 50 miles.  BSW provided her with contact information to schedule transport and informed her that she must schedule three business days in advance.  Ms. Crandell verbalized understanding.  She said that she is waiting to hear back from provider about date/time/location of Nephrology appointment.  BSW is closing at this time due to no other social work needs being identified.    Ronn Melena, BSW Social Worker [phone]

## 2018-08-05 NOTE — Progress Notes (Signed)
amb nephrology

## 2018-08-06 NOTE — Telephone Encounter (Signed)
pls see referral to Nephrology and arrange and let pt know. thanks

## 2018-08-07 NOTE — Telephone Encounter (Signed)
Put in referral to [facility]

## 2018-08-15 DIAGNOSIS — M170 Bilateral primary osteoarthritis of knee: Secondary | ICD-10-CM | POA: Diagnosis not present

## 2018-09-03 ENCOUNTER — Ambulatory Visit (INDEPENDENT_AMBULATORY_CARE_PROVIDER_SITE_OTHER): Payer: Medicare Other

## 2018-09-03 ENCOUNTER — Other Ambulatory Visit: Payer: Self-pay | Admitting: Cardiology

## 2018-09-03 DIAGNOSIS — I6523 Occlusion and stenosis of bilateral carotid arteries: Secondary | ICD-10-CM | POA: Diagnosis not present

## 2018-09-04 ENCOUNTER — Telehealth: Payer: Self-pay | Admitting: *Deleted

## 2018-09-04 DIAGNOSIS — I6523 Occlusion and stenosis of bilateral carotid arteries: Secondary | ICD-10-CM

## 2018-09-04 DIAGNOSIS — I2510 Atherosclerotic heart disease of native coronary artery without angina pectoris: Secondary | ICD-10-CM

## 2018-09-04 NOTE — Telephone Encounter (Signed)
Pt aware - orders placed for 1 year repeat and routed to schedulers

## 2018-09-04 NOTE — Telephone Encounter (Signed)
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

## 2018-09-10 ENCOUNTER — Other Ambulatory Visit: Payer: Self-pay | Admitting: Family Medicine

## 2018-09-24 ENCOUNTER — Telehealth: Payer: Self-pay | Admitting: *Deleted

## 2018-09-24 NOTE — Telephone Encounter (Signed)
   Cardiac Questionnaire:    Since your last visit or hospitalization:    1. Have you been having new or worsening chest pain? No   2. Have you been having new or worsening shortness of breath? No 3. Have you been having new or worsening leg swelling, wt gain, or increase in abdominal girth (pants fitting more tightly)? No   4. Have you had any passing out spells? No    Given the current situation regarding the worldwide coronarvirus pandemic, at the recommendation of the CDC, we are looking to limit gatherings in our waiting area, and thus will reschedule their appointment beyond four weeks from today.     

## 2018-09-29 ENCOUNTER — Ambulatory Visit: Payer: Medicare Other | Admitting: Cardiology

## 2018-09-29 ENCOUNTER — Other Ambulatory Visit: Payer: Self-pay | Admitting: Family Medicine

## 2018-09-30 ENCOUNTER — Telehealth: Payer: Self-pay | Admitting: *Deleted

## 2018-09-30 ENCOUNTER — Other Ambulatory Visit: Payer: Self-pay

## 2018-09-30 MED ORDER — HYDRALAZINE HCL 25 MG PO TABS: 25.0000 mg | ORAL_TABLET | Freq: Three times a day (TID) | ORAL | 1 refills | Status: DC

## 2018-09-30 NOTE — Telephone Encounter (Signed)
Pt called wanted to know her hydralazine was instructed to take 3 times a day when she went to pick it up she only got 90 pills stated this was to be a 3 month supply. Wanted to clarify or see if 3 months could get sent in

## 2018-09-30 NOTE — Telephone Encounter (Signed)
90day supply sent.

## 2018-10-13 ENCOUNTER — Other Ambulatory Visit: Payer: Self-pay | Admitting: Family Medicine

## 2018-10-15 ENCOUNTER — Encounter: Payer: Self-pay | Admitting: *Deleted

## 2018-11-06 ENCOUNTER — Telehealth: Payer: Self-pay

## 2018-11-06 DIAGNOSIS — N183 Chronic kidney disease, stage 3 unspecified: Secondary | ICD-10-CM

## 2018-11-06 NOTE — Telephone Encounter (Signed)
[name] [name] wants to stay with Dr Aubery Lapping because she just can't get to [facility] easily and the only problem she had with him was he didn't talk facing her and she couldn't understand what he is saying to her and she likes to understand

## 2018-11-06 NOTE — Addendum Note (Signed)
Addended by: Eual Fines on: 11/06/2018 03:55 PM   Modules accepted: Orders

## 2018-11-06 NOTE — Telephone Encounter (Signed)
[name] [name] got a call today from [facility] and now she wants to stay with [name] [name] (she really likes him) but she can't understand what he is saying bc he talks low and not facing her. She can't get out to [place]. She wants to know if you could speak with him and see if he could explain things to her better so she understands and doesn't have to find another nephrologist. She currently doesn't have a follow up appt with him.

## 2018-11-06 NOTE — Telephone Encounter (Signed)
Please send in a referral I will sign. Let pt know, best if she directly explains to him, that she is having t difficulty understanding a him when she does not , and ask hi to repeat and explain

## 2018-11-10 ENCOUNTER — Ambulatory Visit: Payer: Medicare Other

## 2018-11-11 ENCOUNTER — Encounter: Payer: Self-pay | Admitting: Family Medicine

## 2018-11-11 ENCOUNTER — Ambulatory Visit (INDEPENDENT_AMBULATORY_CARE_PROVIDER_SITE_OTHER): Payer: Medicare Other | Admitting: Family Medicine

## 2018-11-11 VITALS — Ht 61.0 in | Wt 194.0 lb

## 2018-11-11 DIAGNOSIS — Z0000 Encounter for general adult medical examination without abnormal findings: Secondary | ICD-10-CM | POA: Diagnosis not present

## 2018-11-11 NOTE — Patient Instructions (Addendum)
[name] [name] , Thank you for taking time to come for your Medicare Wellness Visit. I appreciate your ongoing commitment to your health goals. Please review the following plan we discussed and let me know if I can assist you in the future.   Continue to practice social distancing during this time to keep you and our community safe. Please wash your hands and practice not touching your face. We look forward to seeing you in the coming months in the office.  Screening recommendations/referrals: Colonoscopy: Due 2023 Mammogram: Will get once able due 2020 Bone Density: Done Recommended yearly ophthalmology/optometry visit for glaucoma screening and checkup Recommended yearly dental visit for hygiene and checkup  Hepatitis C screening: Gust with Dr. Moshe Cipro, can be added onto your next lab check without issue.  Vaccinations: Influenza vaccine: Due August 2020 Pneumococcal vaccine: Done Tdap vaccine: Due  Shingles vaccine: Completed Zoster  Advanced directives: Done  Conditions/risks identified: Done   Next appointment:  June 2 of 2020 at 10 AM with Dr. Moshe Cipro.  Please get your labs 1 week before.   Preventive Care 35 Years and Older, Female Preventive care refers to lifestyle choices and visits with your health care provider that can promote health and wellness. What does preventive care include?  A yearly physical exam. This is also called an annual well check.  Dental exams once or twice a year.  Routine eye exams. Ask your health care provider how often you should have your eyes checked.  Personal lifestyle choices, including:  Daily care of your teeth and gums.  Regular physical activity.  Eating a healthy diet.  Avoiding tobacco and drug use.  Limiting alcohol use.  Practicing safe sex.  Taking low-dose aspirin every day.  Taking vitamin and mineral supplements as recommended by your health care provider. What happens during an annual well check? The  services and screenings done by your health care provider during your annual well check will depend on your age, overall health, lifestyle risk factors, and family history of disease. Counseling  Your health care provider may ask you questions about your:  Alcohol use.  Tobacco use.  Drug use.  Emotional well-being.  Home and relationship well-being.  Sexual activity.  Eating habits.  History of falls.  Memory and ability to understand (cognition).  Work and work Statistician.  Reproductive health. Screening  You may have the following tests or measurements:  Height, weight, and BMI.  Blood pressure.  Lipid and cholesterol levels. These may be checked every 5 years, or more frequently if you are over 1 years old.  Skin check.  Lung cancer screening. You may have this screening every year starting at age 40 if you have a 30-pack-year history of smoking and currently smoke or have quit within the past 15 years.  Fecal occult blood test (FOBT) of the stool. You may have this test every year starting at age 92.  Flexible sigmoidoscopy or colonoscopy. You may have a sigmoidoscopy every 5 years or a colonoscopy every 10 years starting at age 35.  Hepatitis C blood test.  Hepatitis B blood test.  Sexually transmitted disease (STD) testing.  Diabetes screening. This is done by checking your blood sugar (glucose) after you have not eaten for a while (fasting). You may have this done every 1-3 years.  Bone density scan. This is done to screen for osteoporosis. You may have this done starting at age 33.  Mammogram. This may be done every 1-2 years. Talk to your health care  provider about how often you should have regular mammograms. Talk with your health care provider about your test results, treatment options, and if necessary, the need for more tests. Vaccines  Your health care provider may recommend certain vaccines, such as:  Influenza vaccine. This is recommended  every year.  Tetanus, diphtheria, and acellular pertussis (Tdap, Td) vaccine. You may need a Td booster every 10 years.  Zoster vaccine. You may need this after age 46.  Pneumococcal 13-valent conjugate (PCV13) vaccine. One dose is recommended after age 84.  Pneumococcal polysaccharide (PPSV23) vaccine. One dose is recommended after age 4. Talk to your health care provider about which screenings and vaccines you need and how often you need them. This information is not intended to replace advice given to you by your health care provider. Make sure you discuss any questions you have with your health care provider. Document Released: 07/15/2015 Document Revised: 03/07/2016 Document Reviewed: 04/19/2015 Elsevier Interactive Patient Education  2017 [place] Prevention in the Home Falls can cause injuries. They can happen to people of all ages. There are many things you can do to make your home safe and to help prevent falls. What can I do on the outside of my home?  Regularly fix the edges of walkways and driveways and fix any cracks.  Remove anything that might make you trip as you walk through a door, such as a raised step or threshold.  Trim any bushes or trees on the path to your home.  Use bright outdoor lighting.  Clear any walking paths of anything that might make someone trip, such as rocks or tools.  Regularly check to see if handrails are loose or broken. Make sure that both sides of any steps have handrails.  Any raised decks and porches should have guardrails on the edges.  Have any leaves, snow, or ice cleared regularly.  Use sand or salt on walking paths during winter.  Clean up any spills in your garage right away. This includes oil or grease spills. What can I do in the bathroom?  Use night lights.  Install grab bars by the toilet and in the tub and shower. Do not use towel bars as grab bars.  Use non-skid mats or decals in the tub or shower.  If  you need to sit down in the shower, use a plastic, non-slip stool.  Keep the floor dry. Clean up any water that spills on the floor as soon as it happens.  Remove soap buildup in the tub or shower regularly.  Attach bath mats securely with double-sided non-slip rug tape.  Do not have throw rugs and other things on the floor that can make you trip. What can I do in the bedroom?  Use night lights.  Make sure that you have a light by your bed that is easy to reach.  Do not use any sheets or blankets that are too big for your bed. They should not hang down onto the floor.  Have a firm chair that has side arms. You can use this for support while you get dressed.  Do not have throw rugs and other things on the floor that can make you trip. What can I do in the kitchen?  Clean up any spills right away.  Avoid walking on wet floors.  Keep items that you use a lot in easy-to-reach places.  If you need to reach something above you, use a strong step stool that has a grab bar.  Keep electrical cords out of the way.  Do not use floor polish or wax that makes floors slippery. If you must use wax, use non-skid floor wax.  Do not have throw rugs and other things on the floor that can make you trip. What can I do with my stairs?  Do not leave any items on the stairs.  Make sure that there are handrails on both sides of the stairs and use them. Fix handrails that are broken or loose. Make sure that handrails are as long as the stairways.  Check any carpeting to make sure that it is firmly attached to the stairs. Fix any carpet that is loose or worn.  Avoid having throw rugs at the top or bottom of the stairs. If you do have throw rugs, attach them to the floor with carpet tape.  Make sure that you have a light switch at the top of the stairs and the bottom of the stairs. If you do not have them, ask someone to add them for you. What else can I do to help prevent falls?  Wear shoes  that:  Do not have high heels.  Have rubber bottoms.  Are comfortable and fit you well.  Are closed at the toe. Do not wear sandals.  If you use a stepladder:  Make sure that it is fully opened. Do not climb a closed stepladder.  Make sure that both sides of the stepladder are locked into place.  Ask someone to hold it for you, if possible.  Clearly mark and make sure that you can see:  Any grab bars or handrails.  First and last steps.  Where the edge of each step is.  Use tools that help you move around (mobility aids) if they are needed. These include:  Canes.  Walkers.  Scooters.  Crutches.  Turn on the lights when you go into a dark area. Replace any light bulbs as soon as they burn out.  Set up your furniture so you have a clear path. Avoid moving your furniture around.  If any of your floors are uneven, fix them.  If there are any pets around you, be aware of where they are.  Review your medicines with your doctor. Some medicines can make you feel dizzy. This can increase your chance of falling. Ask your doctor what other things that you can do to help prevent falls. This information is not intended to replace advice given to you by your health care provider. Make sure you discuss any questions you have with your health care provider. Document Released: 04/14/2009 Document Revised: 11/24/2015 Document Reviewed: 07/23/2014 Elsevier Interactive Patient Education  2017 [employer].

## 2018-11-11 NOTE — Progress Notes (Signed)
Subjective:   [name] [name] is a 78 y.o. female who presents for Medicare Annual (Subsequent) preventive examination.  Location of Patient: Home Location of Provider: Telehealth Consent was obtain for visit to be over via telehealth.  I verified that I am speaking with the correct person using two identifiers.   Review of Systems:    Cardiac Risk Factors include: advanced age (>38men, >75 women);obesity (BMI >30kg/m2);sedentary lifestyle     Objective:     Vitals: Ht 5\' 1"  (1.549 m)   Wt 194 lb (88 kg)   BMI 36.66 kg/m   Body mass index is 36.66 kg/m.  Advanced Directives 02/26/2018 02/26/2018 12/21/2016 09/20/2016 12/07/2014 02/04/2013  Does Patient Have a Medical Advance Directive? No No No No No Patient does not have advance directive;Patient would not like information  Would patient like information on creating a medical advance directive? No - Patient declined - No - Patient declined Yes (MAU/Ambulatory/Procedural Areas - Information given) No - patient declined information -  Pre-existing out of facility DNR order (yellow form or pink MOST form) - - - - - No    Tobacco Social History   Tobacco Use  Smoking Status Former Smoker  . Packs/day: 0.25  . Years: 40.00  . Pack years: 10.00  . Types: Cigarettes  . Last attempt to quit: 07/03/2003  . Years since quitting: 15.3  Smokeless Tobacco Never Used     Counseling given: Yes   Clinical Intake:  Pre-visit preparation completed: Yes  Pain : 0-10 Pain Score: 3  Pain Location: Knee(also left hip) Pain Orientation: Right Pain Descriptors / Indicators: ("just a pain") Pain Onset: Other (comment)(comes and goes) Pain Frequency: Intermittent Pain Relieving Factors: aspercreme Effect of Pain on Daily Activities: sometimes  Pain Relieving Factors: aspercreme  BMI - recorded: 36.66 Nutritional Status: BMI > 30  Obese Nutritional Risks: None Diabetes: No  How often do you need to have someone help you when you  read instructions, pamphlets, or other written materials from your doctor or pharmacy?: 1 - Never What is the last grade level you completed in school?: some college  Interpreter Needed?: No     Past Medical History:  Diagnosis Date  . 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 Carotid artery disease ([place])   . CKD (chronic kidney disease), stage III ([place])   . Essential hypertension   . Glaucoma    Laser surgery 2010  . Hyperlipidemia   . Hypothyroidism   . Iron deficiency anemia   . Obesity    Past Surgical History:  Procedure Laterality Date  . COLONOSCOPY N/A 02/04/2013   Procedure: COLONOSCOPY;  Surgeon: Rogene Houston, MD;  [facility];  Service: Endoscopy;  Laterality: N/A;  1015  . COLONOSCOPY N/A 12/21/2016   Procedure: COLONOSCOPY;  Surgeon: Rogene Houston, MD;  [facility];  Service: Endoscopy;  Laterality: N/A;  . CORONARY ARTERY BYPASS GRAFT  October 2005   Dr. Roxy Manns - LIMA to LAD, SVG to OM 2 and OM 3, SVG to PDA  . ESOPHAGOGASTRODUODENOSCOPY N/A 12/21/2016   Procedure: ESOPHAGOGASTRODUODENOSCOPY (EGD);  Surgeon: Rogene Houston, MD;  [facility];  Service: Endoscopy;  Laterality: N/A;  11:10  . ESOPHAGOGASTRODUODENOSCOPY N/A 02/27/2018   Procedure: ESOPHAGOGASTRODUODENOSCOPY (EGD);  Surgeon: Rogene Houston, MD;  [facility];  Service: Endoscopy;  Laterality: N/A;  . EYE SURGERY  2010   laser treatment to both eyes  for glaucoma  . GIVENS CAPSULE STUDY N/A 03/07/2018   Procedure: GIVENS CAPSULE STUDY;  Surgeon: Rogene Houston, MD;  [facility];  Service: Endoscopy;  Laterality: N/A;   Family History  Problem Relation Age of Onset  . Aneurysm Mother   . Hypertension Mother   . Coronary artery disease Mother   . Coronary artery disease Father   . Diabetes Son   . Pancreatic cancer Son   . Colon cancer Brother 27       lived 18 months afterwards  . Liver cancer  Brother 38  . Diabetes Sister   . Coronary artery disease Sister   . Diverticulosis Sister   . COPD Sister   . Kidney disease Sister   . Diabetes Sister   . Hypertension Sister   . Breast cancer Sister 20  . Hearing loss Sister   . Heart Problems Sister        pacemaker   Social History   Socioeconomic History  . Marital status: Married    Spouse name: Not on file  . Number of children: 1  . Years of education: Not on file  . Highest education level: Not on file  Occupational History  . Occupation: retired   Scientific laboratory technician  . Financial resource strain: Not very hard  . Food insecurity:    Worry: Never true    Inability: Never true  . Transportation needs:    Medical: Yes    Non-medical: Yes  Tobacco Use  . Smoking status: Former Smoker    Packs/day: 0.25    Years: 40.00    Pack years: 10.00    Types: Cigarettes    Last attempt to quit: 07/03/2003    Years since quitting: 15.3  . Smokeless tobacco: Never Used  Substance and Sexual Activity  . Alcohol use: No    Alcohol/week: 0.0 standard drinks  . Drug use: No  . Sexual activity: Never  Lifestyle  . Physical activity:    Days per week: 0 days    Minutes per session: 0 min  . Stress: To some extent  Relationships  . Social connections:    Talks on phone: Once a week    Gets together: Once a week    Attends religious service: More than 4 times per year    Active member of club or organization: Not on file    Attends meetings of clubs or organizations: More than 4 times per year    Relationship status: Married  Other Topics Concern  . Not on file  Social History Narrative  . Not on file    Outpatient Encounter Medications as of 11/11/2018  Medication Sig  . acetaminophen (TYLENOL) 500 MG tablet Take 1,000 mg by mouth every 6 (six) hours as needed for mild pain or headache.   . albuterol (PROAIR HFA) 108 (90 Base) MCG/ACT inhaler INHALE ONE PUFF EVERY FOUR HOURS AS NEEDED.  Marland Kitchen amLODipine (NORVASC) 5 MG tablet  TAKE ONE TABLET BY MOUTH DAILY.  Marland Kitchen aspirin 81 MG tablet Take 1 tablet (81 mg total) by mouth daily. Restart on 03/02/18  . cetirizine (ZYRTEC) 10 MG tablet TAKE ONE TABLET BY MOUTH DAILY.  Marland Kitchen Cholecalciferol (VITAMIN D3) 1000 UNITS CAPS Take 1 capsule by mouth daily.  . fish oil-omega-3 fatty acids 1000 MG capsule Take 1 g by mouth daily.   . fluticasone (FLONASE) 50 MCG/ACT nasal spray USE 2 SPRAYS IN EACH NOSTRIL ONCE DAILY.  SHAKE GENTLY BEFORE USING. (Patient taking differently:  USE 1 SPRAYS IN EACH NOSTRIL ONCE DAILY AS NEEDED FOR ALLERGIES.  SHAKE GENTLY BEFORE USING.)  . hydrALAZINE (APRESOLINE) 25 MG tablet Take 1 tablet (25 mg total) by mouth 3 (three) times daily.  Marland Kitchen latanoprost (XALATAN) 0.005 % ophthalmic solution Place 1 drop into both eyes at bedtime. One drop each eye at bedtime   . levothyroxine (SYNTHROID, LEVOTHROID) 75 MCG tablet One and a half tablets every Monday , Wednesday and Friday. One tablet once daily on Tuesday, Thursday, Saturday and Sunday.  . metoprolol succinate (TOPROL-XL) 100 MG 24 hr tablet TAKE ONE TABLET BY MOUTH DAILY. TAKE WITH OR IMMEDIATELY FOLLOWING A MEAL.  . Misc. Devices (WALL GRAB BAR) MISC Grab bar for shower x 2 DX high fall risk  . Multiple Vitamins-Minerals (THERATRUM COMPLETE 50 PLUS PO) Take 1 tablet by mouth daily.   Marland Kitchen omeprazole (PRILOSEC) 40 MG capsule TAKE ONE CAPSULE BY MOUTH DAILY.  Vladimir Faster Glycol-Propyl Glycol (SYSTANE ULTRA OP) Apply 1 drop to eye as needed (dry eyes).  . rosuvastatin (CRESTOR) 10 MG tablet Take 1 tablet (10 mg total) by mouth daily.  . nitroGLYCERIN (NITROSTAT) 0.4 MG SL tablet Place 1 tablet (0.4 mg total) under the tongue every 5 (five) minutes as needed for chest pain.   No facility-administered encounter medications on file as of 11/11/2018.     Activities of Daily Living In your present state of health, do you have any difficulty performing the following activities: 11/11/2018 02/26/2018  Hearing? N -  Vision?  N -  Difficulty concentrating or making decisions? N -  Walking or climbing stairs? Y -  Dressing or bathing? N N  Doing errands, shopping? N -  Preparing Food and eating ? N -  Using the Toilet? N -  In the past six months, have you accidently leaked urine? N -  Do you have problems with loss of bowel control? N -  Managing your Medications? N -  Managing your Finances? N -  Housekeeping or managing your Housekeeping? N -  Some recent data might be hidden    Patient Care Team: Fayrene Helper, MD as PCP - General Carlena Bjornstad, MD as Consulting Physician (Cardiology) Warden Fillers, MD as Consulting Physician (Optometry) Steffanie Rainwater, DPM as Consulting Physician (Podiatry)    Assessment:   This is a routine wellness examination for [place].  Exercise Activities and Dietary recommendations Current Exercise Habits: The patient does not participate in regular exercise at present, Exercise limited by: orthopedic condition(s)  Goals    . Exercise 3x per week (30 min per time)     Recommend chair exercises 3 times a week for 30-45 minutes at a time as tolerated.       Fall Risk Fall Risk  11/11/2018 07/29/2018 05/07/2018 03/04/2018 11/08/2017  Falls in the past year? 0 0 0 No No  Number falls in past yr: - - 0 - -  Injury with Fall? 0 - 0 - -   Is the patient's home free of loose throw rugs in walkways, pet beds, electrical cords, etc?   yes      Grab bars in the bathroom? no      Handrails on the stairs?   yes      Adequate lighting?   yes  Timed Get Up and Go performed:  Unable to perform telemedicine visit  Depression Screen PHQ 2/9 Scores 11/11/2018 08/04/2018 07/29/2018 05/07/2018  PHQ - 2 Score 0 0 0 0  PHQ- 9 Score - -  2 -     Cognitive Function     6CIT Screen 11/11/2018 11/08/2017 09/20/2016  What Year? 0 points 0 points 0 points  What month? 0 points 0 points 0 points  What time? 0 points 0 points 0 points  Count back from 20 0 points 0 points 0 points   Months in reverse 0 points 0 points 0 points  Repeat phrase 4 points 0 points 0 points  Total Score 4 0 0    Immunization History  Administered Date(s) Administered  . Influenza Split 03/11/2012  . Influenza,inj,Quad PF,6+ Mos 04/02/2013, 05/17/2014, 06/29/2015, 06/27/2016, 02/25/2017, 03/04/2018  . Pneumococcal Conjugate-13 01/04/2014  . Pneumococcal Polysaccharide-23 09/25/2007  . Td 09/25/2007  . Zoster 03/12/2012    Qualifies for Shingles Vaccine? Completed   Screening Tests Health Maintenance  Topic Date Due  . TETANUS/TDAP  03/05/2019 (Originally 09/24/2017)  . INFLUENZA VACCINE  01/31/2019  . DEXA SCAN  Completed  . PNA vac Low Risk Adult  Completed    Cancer Screenings: Lung: Low Dose CT Chest recommended if Age 35-80 years, 30 pack-year currently smoking OR have quit w/in 15years. Patient does not qualify. Breast:  Up to date on Mammogram? Yes   Up to date of Bone Density/Dexa? Yes Colorectal: completed up to date next one due in 2023  Additional Screenings:   Hepatitis C Screening: Discuss with Dr. Moshe Cipro can add on next lab check     Plan:      1. Encounter for Medicare annual wellness exam  I have personally reviewed and noted the following in the patient's chart:   . Medical and social history . Use of alcohol, tobacco or illicit drugs  . Current medications and supplements . Functional ability and status . Nutritional status . Physical activity . Advanced directives . List of other physicians . Hospitalizations, surgeries, and ER visits in previous 12 months . Vitals . Screenings to include cognitive, depression, and falls . Referrals and appointments  In addition, I have reviewed and discussed with patient certain preventive protocols, quality metrics, and best practice recommendations. A written personalized care plan for preventive services as well as general preventive health recommendations were provided to patient.     I provided 20  minutes of non-face-to-face time during this encounter.    Perlie Mayo, NP  11/11/2018

## 2018-11-26 DIAGNOSIS — D649 Anemia, unspecified: Secondary | ICD-10-CM | POA: Diagnosis not present

## 2018-11-26 DIAGNOSIS — E785 Hyperlipidemia, unspecified: Secondary | ICD-10-CM | POA: Diagnosis not present

## 2018-11-26 DIAGNOSIS — I10 Essential (primary) hypertension: Secondary | ICD-10-CM | POA: Diagnosis not present

## 2018-11-26 DIAGNOSIS — E038 Other specified hypothyroidism: Secondary | ICD-10-CM | POA: Diagnosis not present

## 2018-11-27 LAB — COMPLETE METABOLIC PANEL WITH GFR
AG Ratio: 1.4 (calc) (ref 1.0–2.5)
ALT: 11 U/L (ref 6–29)
AST: 19 U/L (ref 10–35)
Albumin: 4.3 g/dL (ref 3.6–5.1)
Alkaline phosphatase (APISO): 68 U/L (ref 37–153)
BUN/Creatinine Ratio: 17 (calc) (ref 6–22)
BUN: 25 mg/dL (ref 7–25)
CO2: 26 mmol/L (ref 20–32)
Calcium: 10.3 mg/dL (ref 8.6–10.4)
Chloride: 106 mmol/L (ref 98–110)
Creat: 1.45 mg/dL — ABNORMAL HIGH (ref 0.60–0.93)
GFR, EST AFRICAN AMERICAN: 40 mL/min/{1.73_m2} — AB (ref >=60)
GFR, Est Non African American: 35 mL/min/{1.73_m2} — ABNORMAL LOW (ref >=60)
Globulin: 3 g/dL (calc) (ref 1.9–3.7)
Glucose, Bld: 97 mg/dL (ref 65–99)
Potassium: 4.4 mmol/L (ref 3.5–5.3)
Sodium: 140 mmol/L (ref 135–146)
TOTAL PROTEIN: 7.3 g/dL (ref 6.1–8.1)
Total Bilirubin: 0.3 mg/dL (ref 0.2–1.2)

## 2018-11-27 LAB — CBC
HCT: 28.5 % — ABNORMAL LOW (ref 35.0–45.0)
Hemoglobin: 9.3 g/dL — ABNORMAL LOW (ref 11.7–15.5)
MCH: 29.4 pg (ref 27.0–33.0)
MCHC: 32.6 g/dL (ref 32.0–36.0)
MCV: 90.2 fL (ref 80.0–100.0)
MPV: 11.8 fL (ref 7.5–12.5)
Platelets: 226 10*3/uL (ref 140–400)
RBC: 3.16 10*6/uL — ABNORMAL LOW (ref 3.80–5.10)
RDW: 13.9 % (ref 11.0–15.0)
WBC: 7.4 10*3/uL (ref 3.8–10.8)

## 2018-11-27 LAB — LIPID PANEL
Cholesterol: 130 mg/dL (ref <200)
HDL: 50 mg/dL (ref >=50)
LDL Cholesterol (Calc): 60 mg/dL (calc)
Non-HDL Cholesterol (Calc): 80 mg/dL (calc) (ref <130)
Total CHOL/HDL Ratio: 2.6 (calc) (ref <5.0)
Triglycerides: 110 mg/dL (ref <150)

## 2018-11-27 LAB — TSH: TSH: 1.91 mIU/L (ref 0.40–4.50)

## 2018-11-27 LAB — FERRITIN: Ferritin: 23 ng/mL (ref 16–288)

## 2018-11-27 LAB — IRON: Iron: 48 ug/dL (ref 45–160)

## 2018-12-02 ENCOUNTER — Other Ambulatory Visit: Payer: Self-pay

## 2018-12-02 ENCOUNTER — Ambulatory Visit (INDEPENDENT_AMBULATORY_CARE_PROVIDER_SITE_OTHER): Payer: Medicare Other | Admitting: Family Medicine

## 2018-12-02 ENCOUNTER — Other Ambulatory Visit: Payer: Self-pay | Admitting: Family Medicine

## 2018-12-02 VITALS — BP 160/90 | HR 70 | Ht 64.0 in | Wt 192.0 lb

## 2018-12-02 DIAGNOSIS — N183 Chronic kidney disease, stage 3 unspecified: Secondary | ICD-10-CM

## 2018-12-02 DIAGNOSIS — I2510 Atherosclerotic heart disease of native coronary artery without angina pectoris: Secondary | ICD-10-CM

## 2018-12-02 DIAGNOSIS — M5136 Other intervertebral disc degeneration, lumbar region: Secondary | ICD-10-CM

## 2018-12-02 DIAGNOSIS — I10 Essential (primary) hypertension: Secondary | ICD-10-CM

## 2018-12-02 DIAGNOSIS — E785 Hyperlipidemia, unspecified: Secondary | ICD-10-CM | POA: Diagnosis not present

## 2018-12-02 DIAGNOSIS — Z1231 Encounter for screening mammogram for malignant neoplasm of breast: Secondary | ICD-10-CM

## 2018-12-02 DIAGNOSIS — E038 Other specified hypothyroidism: Secondary | ICD-10-CM | POA: Diagnosis not present

## 2018-12-02 DIAGNOSIS — K219 Gastro-esophageal reflux disease without esophagitis: Secondary | ICD-10-CM

## 2018-12-02 DIAGNOSIS — M4316 Spondylolisthesis, lumbar region: Secondary | ICD-10-CM

## 2018-12-02 NOTE — Patient Instructions (Addendum)
Annual physical exam end September, with  Flu vaccine, call if you need me sooner  Mammogram to be scheduled for mid morning appointment June 18 or after  You are referred to Dr Birdie Sons, very important that you keep the appointment   Social distancing.Avoid crowds and maintain a 6 ft distance Frequent hand washing with soap and water Keeping your hands off of your face. These 3 practices will help to keep both you and your community healthy during this time. Please practice them faithfully!    Labs are excellent , no  changes in medication

## 2018-12-02 NOTE — Progress Notes (Signed)
Virtual Visit via Telephone Note  I connected with [place] on 12/02/18 at 10:00 AM EDT by telephone and verified that I am speaking with the correct person using two identifiers.  Location: Patient:home Provider: office   I discussed the limitations, risks, security and privacy concerns of performing an evaluation and management service by telephone and the availability of in person appointments. I also discussed with the patient that there may be a patient responsible charge related to this service. The patient expressed understanding and agreed to proceed. This visit type is conducted due to national recommendations for restrictions regarding the COVID -19 Pandemic. Due to the patient's age and / or co morbidities, this format is felt to be most appropriate at this time without adequate follow up. The patient has no access to video technology/ had technical difficulties with video, requiring transitioning to audio format  only ( telephone ). All issues noted this document were discussed and addressed,no physical exam can be performed in this format.   History of Present Illness: Denies recent fever or chills. Denies sinus pressure, nasal congestion, ear pain or sore throat. Denies chest congestion, productive cough or wheezing. Denies chest pains, palpitations and leg swelling Denies abdominal pain, nausea, vomiting,diarrhea or constipation.   Denies dysuria, frequency, hesitancy or incontinence. Denies headaches, seizures, numbness, or tingling. Denies depression, anxiety or insomnia. Denies skin break down or rash. Right knee pain and popping on March 13 in MD office,  Pt stood up and couldn't move, compresses, aspercreme and Ortho  Gave intra articular injection in April, knee pain persists , and may be worse , and left  hip pain also present for over 3 years, hip pain rated between 2 to 6, uses eS tylenol  For acutep ain, wants clarification that this is safe and I assure her  that it is       Observations/Objective: BP (!) 160/90   Pulse 70   Ht 5\' 4"  (1.626 m)   Wt 192 lb (87.1 kg)   BMI 32.96 kg/m    Good communication with no confusion and intact memory. Alert and oriented x 3 No signs of respiratory distress during sppech    Assessment and Plan:  CKD (chronic kidney disease) stage 3, GFR 30-59 ml/min (HCC) Needs to re establish with Nephrology and understands the importance, clinically stable currently  Essential hypertension, malignant Uncontrolled elevated systolic , however , pt reports compliance with multiple meds prescribed and has been intolerant of dose increase, also followed by Cardiology, no med changes at this time DASH diet and commitment to daily physical activity for a minimum of 30 minutes discussed and encouraged, as a part of hypertension management. The importance of attaining a healthy weight is also discussed.  BP/Weight 12/02/2018 11/11/2018 07/29/2018 05/07/2018 03/04/2018 02/27/2018 7/90/2409  Systolic BP 735 - 329 924 268 341 -  Diastolic BP 90 - 70 56 78 51 -  Wt. (Lbs) 192 194 194 194.08 193 - 190  BMI 32.96 36.66 36.66 36.67 36.47 - 35.9       Hypothyroidism Controlled, no change in medication   Hyperlipidemia LDL goal <70 Hyperlipidemia:Low fat diet discussed and encouraged.   Lipid Panel  Lab Results  Component Value Date   CHOL 130 11/26/2018   HDL 50 11/26/2018   LDLCALC 60 11/26/2018   TRIG 110 11/26/2018   CHOLHDL 2.6 11/26/2018  Controlled, no change in medication      CAD (coronary artery disease) Currently asymptomatic, continue  Cardiology follow up as  before,  Will co refer sooner if need arisies  GERD (gastroesophageal reflux disease) Controlled, no change in medication Pt reminded to avoid caffeine  Lumbar adjacent segment disease with spondylolisthesis Increased pain , discussed safety of tylenol only for pain, and the need to continue to work on weight loss   Follow Up  Instructions:    I discussed the assessment and treatment plan with the patient. The patient was provided an opportunity to ask questions and all were answered. The patient agreed with the plan and demonstrated an understanding of the instructions.   The patient was advised to call back or seek an in-person evaluation if the symptoms worsen or if the condition fails to improve as anticipated.  I provided 25 minutes of non-face-to-face time during this encounter.   Tula Nakayama, MD

## 2018-12-05 ENCOUNTER — Ambulatory Visit: Payer: Medicare Other | Admitting: Cardiology

## 2018-12-07 ENCOUNTER — Encounter: Payer: Self-pay | Admitting: Family Medicine

## 2018-12-07 NOTE — Assessment & Plan Note (Signed)
Increased pain , discussed safety of tylenol only for pain, and the need to continue to work on weight loss

## 2018-12-07 NOTE — Assessment & Plan Note (Signed)
Hyperlipidemia:Low fat diet discussed and encouraged.   Lipid Panel  Lab Results  Component Value Date   CHOL 130 11/26/2018   HDL 50 11/26/2018   LDLCALC 60 11/26/2018   TRIG 110 11/26/2018   CHOLHDL 2.6 11/26/2018  Controlled, no change in medication

## 2018-12-07 NOTE — Assessment & Plan Note (Signed)
Controlled, no change in medication Pt reminded to avoid caffeine

## 2018-12-07 NOTE — Assessment & Plan Note (Signed)
Controlled, no change in medication  

## 2018-12-07 NOTE — Assessment & Plan Note (Signed)
Needs to re establish with Nephrology and understands the importance, clinically stable currently

## 2018-12-07 NOTE — Assessment & Plan Note (Addendum)
Currently asymptomatic, continue  Cardiology follow up as before,  Will co refer sooner if need arisies

## 2018-12-07 NOTE — Assessment & Plan Note (Signed)
Uncontrolled elevated systolic , however , pt reports compliance with multiple meds prescribed and has been intolerant of dose increase, also followed by Cardiology, no med changes at this time DASH diet and commitment to daily physical activity for a minimum of 30 minutes discussed and encouraged, as a part of hypertension management. The importance of attaining a healthy weight is also discussed.  BP/Weight 12/02/2018 11/11/2018 07/29/2018 05/07/2018 03/04/2018 02/27/2018 8/73/7308  Systolic BP 168 - 387 065 826 088 -  Diastolic BP 90 - 70 56 78 51 -  Wt. (Lbs) 192 194 194 194.08 193 - 190  BMI 32.96 36.66 36.66 36.67 36.47 - 35.9

## 2018-12-17 ENCOUNTER — Ambulatory Visit (HOSPITAL_COMMUNITY): Payer: Medicare Other

## 2018-12-18 ENCOUNTER — Other Ambulatory Visit: Payer: Self-pay

## 2018-12-18 ENCOUNTER — Ambulatory Visit (HOSPITAL_COMMUNITY)
Admission: RE | Admit: 2018-12-18 | Discharge: 2018-12-18 | Disposition: A | Discharge status: Home / Self Care | Payer: Medicare Other | Source: Ambulatory Visit | Attending: Family Medicine | Admitting: Family Medicine

## 2018-12-18 DIAGNOSIS — Z1231 Encounter for screening mammogram for malignant neoplasm of breast: Secondary | ICD-10-CM | POA: Insufficient documentation

## 2018-12-30 ENCOUNTER — Other Ambulatory Visit: Payer: Self-pay | Admitting: Family Medicine

## 2019-01-27 ENCOUNTER — Encounter (INDEPENDENT_AMBULATORY_CARE_PROVIDER_SITE_OTHER): Payer: Self-pay

## 2019-01-27 ENCOUNTER — Encounter: Payer: Self-pay | Admitting: Family Medicine

## 2019-01-27 ENCOUNTER — Other Ambulatory Visit: Payer: Self-pay

## 2019-01-27 ENCOUNTER — Ambulatory Visit (INDEPENDENT_AMBULATORY_CARE_PROVIDER_SITE_OTHER): Payer: Medicare Other | Admitting: Family Medicine

## 2019-01-27 ENCOUNTER — Other Ambulatory Visit: Payer: Self-pay | Admitting: Family Medicine

## 2019-01-27 VITALS — BP 144/78 | Ht 64.0 in | Wt 195.8 lb

## 2019-01-27 DIAGNOSIS — I10 Essential (primary) hypertension: Secondary | ICD-10-CM

## 2019-01-27 DIAGNOSIS — M7989 Other specified soft tissue disorders: Secondary | ICD-10-CM | POA: Diagnosis not present

## 2019-01-27 DIAGNOSIS — R2242 Localized swelling, mass and lump, left lower limb: Secondary | ICD-10-CM

## 2019-01-27 DIAGNOSIS — R2241 Localized swelling, mass and lump, right lower limb: Secondary | ICD-10-CM

## 2019-01-27 DIAGNOSIS — N183 Chronic kidney disease, stage 3 unspecified: Secondary | ICD-10-CM

## 2019-01-27 NOTE — Progress Notes (Signed)
Virtual Visit via Telephone Note   This visit type was conducted due to national recommendations for restrictions regarding the COVID-19 Pandemic (e.g. social distancing) in an effort to limit this patient's exposure and mitigate transmission in our community.  Due to her co-morbid illnesses, this patient is at least at moderate risk for complications without adequate follow up.  This format is felt to be most appropriate for this patient at this time.  The patient did not have access to video technology/had technical difficulties with video requiring transitioning to audio format only (telephone).  All issues noted in this document were discussed and addressed.  No physical exam could be performed with this format.    Evaluation Performed:  Follow-up visit  Date:  01/27/2019   ID:  [name], [name] 1940-12-05, MRN [id number]  Patient Location: Home Provider Location: Office  Location of Patient: Home Location of Provider: Telehealth Consent was obtain for visit to be over via telehealth. I verified that I am speaking with the correct person using two identifiers.  PCP:  Fayrene Helper, MD   Chief Complaint:  Leg swelling  History of Present Illness:    [name] [name] is a 78 y.o. female with hx of CAD, HTN, CKD, s/p CABG, obesity among others.  She presents for a phone visit regarding her ankles swelling. She reports swelling around ankles, that goes down at night when she keeps them propped up, swells during the day when she is up and about during the day doing things. She denies CP, SHOB, or trouble with dyspnea or palpitations. She reports taking her medications as directed. And has a cardiology appt 02/24/2019. She has compression hose, but they are hard to put on she says. She reports using salt when she cooks, but states she does not salt after cooking the food. She denies injuries or trauma. She denies any other concerns today.   The patient does not have symptoms  concerning for COVID-19 infection (fever, chills, cough, or new shortness  of breath).   Past Medical, Surgical, Social History, Allergies, and Medications have been Reviewed.  Past Medical History:  Diagnosis Date  . 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 Carotid artery disease ([place])   . CKD (chronic kidney disease), stage III ([place])   . Essential hypertension   . Glaucoma    Laser surgery 2010  . Hyperlipidemia   . Hypothyroidism   . Iron deficiency anemia   . Obesity    Past Surgical History:  Procedure Laterality Date  . COLONOSCOPY N/A 02/04/2013   Procedure: COLONOSCOPY;  Surgeon: Rogene Houston, MD;  [facility]: [facility];  Service: Endoscopy;  Laterality: N/A;  1015  . COLONOSCOPY N/A 12/21/2016   Procedure: COLONOSCOPY;  Surgeon: Rogene Houston, MD;  [facility]: [facility];  Service: Endoscopy;  Laterality: N/A;  . CORONARY ARTERY BYPASS GRAFT  October 2005   Dr. Roxy Manns - LIMA to LAD, SVG to OM 2 and OM 3, SVG to PDA  . ESOPHAGOGASTRODUODENOSCOPY N/A 12/21/2016   Procedure: ESOPHAGOGASTRODUODENOSCOPY (EGD);  Surgeon: Rogene Houston, MD;  [facility]: [facility];  Service: Endoscopy;  Laterality: N/A;  11:10  . ESOPHAGOGASTRODUODENOSCOPY N/A 02/27/2018   Procedure: ESOPHAGOGASTRODUODENOSCOPY (EGD);  Surgeon: Rogene Houston, MD;  [facility];  Service: Endoscopy;  Laterality: N/A;  . EYE SURGERY  2010   laser treatment to both eyes for glaucoma  . GIVENS CAPSULE  STUDY N/A 03/07/2018   Procedure: GIVENS CAPSULE STUDY;  Surgeon: Rogene Houston, MD;  [facility]: [facility];  Service: Endoscopy;  Laterality: N/A;     Current Meds  Medication Sig  . acetaminophen (TYLENOL) 500 MG tablet Take 1,000 mg by mouth every 6 (six) hours as needed for mild pain or headache.   . albuterol (PROAIR HFA) 108 (90 Base) MCG/ACT inhaler INHALE ONE PUFF EVERY FOUR HOURS AS NEEDED.  Marland Kitchen amLODipine (NORVASC) 5 MG tablet TAKE ONE TABLET BY  MOUTH DAILY.  Marland Kitchen aspirin 81 MG tablet Take 1 tablet (81 mg total) by mouth daily. Restart on 03/02/18  . cetirizine (ZYRTEC) 10 MG tablet TAKE ONE TABLET BY MOUTH DAILY.  Marland Kitchen Cholecalciferol (VITAMIN D3) 1000 UNITS CAPS Take 1 capsule by mouth daily.  . fish oil-omega-3 fatty acids 1000 MG capsule Take 1 g by mouth daily.   . fluticasone (FLONASE) 50 MCG/ACT nasal spray USE 2 SPRAYS IN EACH NOSTRIL ONCE DAILY.  SHAKE GENTLY BEFORE USING. (Patient taking differently: USE 1 SPRAYS IN EACH NOSTRIL ONCE DAILY AS NEEDED FOR ALLERGIES.  SHAKE GENTLY BEFORE USING.)  . hydrALAZINE (APRESOLINE) 25 MG tablet Take 1 tablet (25 mg total) by mouth 3 (three) times daily.  Marland Kitchen latanoprost (XALATAN) 0.005 % ophthalmic solution Place 1 drop into both eyes at bedtime. One drop each eye at bedtime   . levothyroxine (SYNTHROID, LEVOTHROID) 75 MCG tablet One and a half tablets every Monday , Wednesday and Friday. One tablet once daily on Tuesday, Thursday, Saturday and Sunday.  . metoprolol succinate (TOPROL-XL) 100 MG 24 hr tablet TAKE ONE TABLET BY MOUTH DAILY. TAKE WITH OR IMMEDIATELY FOLLOWING A MEAL.  . Misc. Devices (WALL GRAB BAR) MISC Grab bar for shower x 2 DX high fall risk  . Multiple Vitamins-Minerals (THERATRUM COMPLETE 50 PLUS PO) Take 1 tablet by mouth daily.   Marland Kitchen omeprazole (PRILOSEC) 40 MG capsule TAKE ONE CAPSULE BY MOUTH DAILY.  Vladimir Faster Glycol-Propyl Glycol (SYSTANE ULTRA OP) Apply 1 drop to eye as needed (dry eyes).  . rosuvastatin (CRESTOR) 10 MG tablet TAKE ONE TABLET BY MOUTH DAILY.     Allergies:   Tramadol and Clonidine derivatives   Social History   Tobacco Use  . Smoking status: Former Smoker    Packs/day: 0.25    Years: 40.00    Pack years: 10.00    Types: Cigarettes    Quit date: 07/03/2003    Years since quitting: 15.5  . Smokeless tobacco: Never Used  Substance Use Topics  . Alcohol use: No    Alcohol/week: 0.0 standard drinks  . Drug use: No     Family Hx: The patient's  family history includes Aneurysm in her mother; Breast cancer (age of onset: 40) in her sister; COPD in her sister; Colon cancer (age of onset: 13) in her brother; Coronary artery disease in her father, mother, and sister; Diabetes in her sister, sister, and son; Diverticulosis in her sister; Hearing loss in her sister; Heart Problems in her sister; Hypertension in her mother and sister; Kidney disease in her sister; Liver cancer (age of onset: 69) in her brother; Pancreatic cancer in her son.  ROS:   Please see the history of present illness.    All other systems reviewed and are negative.   Labs/Other Tests and Data Reviewed:     Recent Labs: 11/26/2018: ALT 11; BUN 25; Creat 1.45; Hemoglobin 9.3; Platelets 226; Potassium 4.4; Sodium 140; TSH 1.91   Recent Lipid Panel Lab  Results  Component Value Date/Time   CHOL 130 11/26/2018 08:33 AM   TRIG 110 11/26/2018 08:33 AM   HDL 50 11/26/2018 08:33 AM   CHOLHDL 2.6 11/26/2018 08:33 AM   LDLCALC 60 11/26/2018 08:33 AM    Wt Readings from Last 3 Encounters:  01/27/19 195 lb 12.8 oz (88.8 kg)  12/02/18 192 lb (87.1 kg)  11/11/18 194 lb (88 kg)     Objective:    Vital Signs:  BP (!) 144/78 Comment: when patient checked it with her cuff last  Wt 195 lb 12.8 oz (88.8 kg)   BMI 33.61 kg/m    VITAL SIGNS:  reviewed GEN:  alert and oriented  RESPIRATORY:  no shortness of breath noted during conversation PSYCH:  normal mood, affect, and good communication  ASSESSMENT & PLAN:    1. Leg swelling Encouraged her to keep legs elevated when sitting and laying and to wear compression hose/socks. Discussed possible causes: increase salt in diet, kidney, heart, and vascular changes (unable to assess in person due to visit). No signs of resp distress noted. Has increased her weight about 4 pounds since visit on June 08/2018. Has appt with Cardiology on 02/24/2019.   2. CKD (chronic kidney disease) stage 3, GFR 30-59 ml/min (HCC) Reports trying to  get an appt with nephrology, but reports they have not scheduled her and told her that they will see her vial phone, but unsure if the Dr would take her. I told her we will call the office and ask what is going on with her appt.  3. Essential hypertension, malignant Elevated per her today. She checks at home. She reports taking medications as directed. I can not verify due to visit. Discussed DASH diet and eating heart healthy foods.  Time:   Today, I have spent 15 minutes with the patient with telehealth technology discussing the above problems.     Medication Adjustments/Labs and Tests Ordered: Current medicines are reviewed at length with the patient today.  Concerns regarding medicines are outlined above.   Tests Ordered: No orders of the defined types were placed in this encounter.   Medication Changes: No orders of the defined types were placed in this encounter.   Disposition:  Follow up in 5 week(s)  Signed, Perlie Mayo, NP  01/27/2019 9:49 AM     [place]

## 2019-01-27 NOTE — Patient Instructions (Addendum)
    Thank you for coming into the office today. I appreciate the opportunity to provide you with the care for your health and wellness. Today we discussed:  Leg swelling  Follow up: scheduled in Sept for CPE already  No labs today  AVOID SALT IN DIET EVEN IN COOKING  WEAR COMPRESSION HOSE OR SOCKS DAILY DURING THE DAY  Please continue to practice social distancing to keep you, your family, and our community safe.  If you must go out, please wear a Mask and practice good handwashing.  [place] YOUR HANDS WELL AND FREQUENTLY. AVOID TOUCHING YOUR FACE, UNLESS YOUR HANDS ARE FRESHLY WASHED.  GET FRESH AIR DAILY. STAY HYDRATED WITH WATER.   It was a pleasure to see you and I look forward to continuing to work together on your health and well-being. Please do not hesitate to call the office if you need care or have questions about your care.  Have a wonderful day and week. With Gratitude, Cherly Beach, DNP, AGNP-BC

## 2019-02-03 ENCOUNTER — Other Ambulatory Visit: Payer: Self-pay | Admitting: Family Medicine

## 2019-02-23 NOTE — Progress Notes (Signed)
Cardiology Office Note  Date: 02/24/2019   ID: Jaide, [place] 08/09/1940, MRN [id number]  PCP:  Fayrene Helper, MD  Cardiologist:  Rozann Lesches, MD Electrophysiologist:  None   Chief Complaint  Patient presents with  . Cardiac follow-up    History of Present Illness: [name] [name] is a 78 y.o. female last seen in March 2019.  She presents for a follow-up visit.  She tells me that she has had no obvious angina symptoms or change in stamina with typical ADLs, still does all of her house chores.  She has typically declined follow-up ischemic testing.  I personally reviewed her ECG today which shows sinus bradycardia with prolonged PR interval and diffuse repolarization abnormalities.  I reviewed her medications which are outlined below.  We discussed up titration of hydralazine for better blood pressure control.  She states that she had some right leg swelling after her right knee "popped." This has gotten better.  Right side is also site of prior vein harvesting.  I reviewed her recent lab work as outlined below.  She continues to follow with Dr. Moshe Cipro.  Past Medical History:  Diagnosis Date  . 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 Carotid artery disease ([place])   . CKD (chronic kidney disease), stage III ([place])   . Essential hypertension   . Glaucoma    Laser surgery 2010  . Hyperlipidemia   . Hypothyroidism   . Iron deficiency anemia   . Obesity     Past Surgical History:  Procedure Laterality Date  . COLONOSCOPY N/A 02/04/2013   Procedure: COLONOSCOPY;  Surgeon: Rogene Houston, MD;  [facility];  Service: Endoscopy;  Laterality: N/A;  1015  . COLONOSCOPY N/A 12/21/2016   Procedure: COLONOSCOPY;  Surgeon: Rogene Houston, MD;  [facility];  Service: Endoscopy;  Laterality: N/A;  . CORONARY ARTERY BYPASS GRAFT  October 2005   Dr. Roxy Manns - LIMA to LAD, SVG to OM 2 and OM 3, SVG to PDA  .  ESOPHAGOGASTRODUODENOSCOPY N/A 12/21/2016   Procedure: ESOPHAGOGASTRODUODENOSCOPY (EGD);  Surgeon: Rogene Houston, MD;  [facility];  Service: Endoscopy;  Laterality: N/A;  11:10  . ESOPHAGOGASTRODUODENOSCOPY N/A 02/27/2018   Procedure: ESOPHAGOGASTRODUODENOSCOPY (EGD);  Surgeon: Rogene Houston, MD;  [facility];  Service: Endoscopy;  Laterality: N/A;  . EYE SURGERY  2010   laser treatment to both eyes for glaucoma  . GIVENS CAPSULE STUDY N/A 03/07/2018   Procedure: GIVENS CAPSULE STUDY;  Surgeon: Rogene Houston, MD;  [facility];  Service: Endoscopy;  Laterality: N/A;    Current Outpatient Medications  Medication Sig Dispense Refill  . acetaminophen (TYLENOL) 500 MG tablet Take 1,000 mg by mouth every 6 (six) hours as needed for mild pain or headache.     . albuterol (PROAIR HFA) 108 (90 Base) MCG/ACT inhaler INHALE ONE PUFF EVERY FOUR HOURS AS NEEDED. 8.5 g 3  . amLODipine (NORVASC) 5 MG tablet TAKE ONE TABLET BY MOUTH DAILY. 90 tablet 1  . aspirin 81 MG tablet Take 1 tablet (81 mg total) by mouth daily. Restart on 03/02/18 30 tablet 0  . cetirizine (ZYRTEC) 10 MG tablet TAKE ONE TABLET BY MOUTH DAILY. 90 tablet 1  . Cholecalciferol (VITAMIN D3) 1000 UNITS CAPS Take 1 capsule by mouth daily.    . fish oil-omega-3 fatty acids 1000 MG capsule Take 1 g by mouth daily.     Marland Kitchen  fluticasone (FLONASE) 50 MCG/ACT nasal spray USE 2 SPRAYS IN EACH NOSTRIL ONCE DAILY.  SHAKE GENTLY BEFORE USING. (Patient taking differently: USE 1 SPRAYS IN EACH NOSTRIL ONCE DAILY AS NEEDED FOR ALLERGIES.  SHAKE GENTLY BEFORE USING.) 48 g 1  . latanoprost (XALATAN) 0.005 % ophthalmic solution Place 1 drop into both eyes at bedtime. One drop each eye at bedtime     . levothyroxine (SYNTHROID, LEVOTHROID) 75 MCG tablet One and a half tablets every Monday , Wednesday and Friday. One tablet once daily on Tuesday, Thursday, Saturday and Sunday. 108 tablet 3  . metoprolol succinate  (TOPROL-XL) 100 MG 24 hr tablet TAKE ONE TABLET BY MOUTH DAILY. TAKE WITH OR IMMEDIATELY FOLLOWING A MEAL. 90 tablet 1  . Misc. Devices (WALL GRAB BAR) MISC Grab bar for shower x 2 DX high fall risk 2 each 0  . Multiple Vitamins-Minerals (THERATRUM COMPLETE 50 PLUS PO) Take 1 tablet by mouth daily.     . nitroGLYCERIN (NITROSTAT) 0.4 MG SL tablet Place 1 tablet (0.4 mg total) under the tongue every 5 (five) minutes as needed for chest pain. 25 tablet 3  . omeprazole (PRILOSEC) 40 MG capsule TAKE ONE CAPSULE BY MOUTH DAILY. 90 capsule 1  . Pediatric Multivitamins-Iron (FLINTSTONES COMPLETE PO) Take 2 tablets by mouth daily. With iron    . Polyethyl Glycol-Propyl Glycol (SYSTANE ULTRA OP) Apply 1 drop to eye as needed (dry eyes).    . rosuvastatin (CRESTOR) 10 MG tablet TAKE ONE TABLET BY MOUTH DAILY. 90 tablet 3  . hydrALAZINE (APRESOLINE) 50 MG tablet Take 1 tablet (50 mg total) by mouth 3 (three) times daily. 270 tablet 3   No current facility-administered medications for this visit.    Allergies:  Tramadol and Clonidine derivatives   Social History: The patient  reports that she quit smoking about 15 years ago. Her smoking use included cigarettes. She has a 10.00 pack-year smoking history. She has never used smokeless tobacco. She reports that she does not drink alcohol or use drugs.   ROS:  Please see the history of present illness. Otherwise, complete review of systems is positive for intermittent gout.  All other systems are reviewed and negative.   Physical Exam: VS:  BP (!) 171/74   Pulse (!) 54   Temp 98.7 F (37.1 C)   Ht 5\' 1"  (1.549 m)   Wt 189 lb 6.4 oz (85.9 kg)   SpO2 98%   BMI 35.79 kg/m , BMI Body mass index is 35.79 kg/m.  Wt Readings from Last 3 Encounters:  02/24/19 189 lb 6.4 oz (85.9 kg)  01/27/19 195 lb 12.8 oz (88.8 kg)  12/02/18 192 lb (87.1 kg)    General: Patient appears comfortable at rest. HEENT: Conjunctiva and lids normal, wearing a mask. Neck:  Supple, no elevated JVP, soft carotid bruits, no thyromegaly. Lungs: Clear to auscultation, nonlabored breathing at rest. Cardiac: Regular rate and rhythm, no S3, 9-9/2 systolic murmur, no pericardial rub. Abdomen: Soft, nontender, bowel sounds present. Extremities: No pitting edema, distal pulses 2+. Skin: Warm and dry. Musculoskeletal: No kyphosis. Neuropsychiatric: Alert and oriented x3, affect grossly appropriate.  ECG:  An ECG dated 02/26/2018 was personally reviewed today and demonstrated:  Sinus rhythm with diffuse nonspecific ST-T wave abnormalities.  Recent Labwork: 11/26/2018: ALT 11; AST 19; BUN 25; Creat 1.45; Hemoglobin 9.3; Platelets 226; Potassium 4.4; Sodium 140; TSH 1.91     Component Value Date/Time   CHOL 130 11/26/2018 0833   TRIG 110 11/26/2018 0833  HDL 50 11/26/2018 0833   CHOLHDL 2.6 11/26/2018 0833   VLDL 19 12/04/2016 1137   LDLCALC 60 11/26/2018 8676    Other Studies Reviewed Today:  Carotid Dopplers 09/03/2018: Summary: Right Carotid: Velocities in the right ICA are consistent with a 40-59%                stenosis.  Left Carotid: Velocities in the left ICA are consistent with a 1-39% stenosis.               The ECA appears >50% stenosed.  Vertebrals:  Bilateral vertebral arteries demonstrate antegrade flow. Subclavians: Normal flow hemodynamics were seen in bilateral subclavian              arteries.  Assessment and Plan:  1.  Multivessel CAD status post CABG in 2005.  ECG reviewed.  She does not report any progressive angina and remains comfortable with observation on medical therapy.  Continue aspirin and statin.  2.  Essential hypertension.  Plan to increase hydralazine to 50 mg 3 times daily and otherwise continue baseline regimen.  3.  Mixed hyperlipidemia, on Crestor.  Recent LDL 60.  4.  Asymptomatic carotid artery disease, follow-up Dopplers from March are outlined above.  5.  Heart murmur, follow-up echocardiogram to assess aortic  valve.  Medication Adjustments/Labs and Tests Ordered: Current medicines are reviewed at length with the patient today.  Concerns regarding medicines are outlined above.   Tests Ordered: Orders Placed This Encounter  Procedures  . EKG 12-Lead  . ECHOCARDIOGRAM COMPLETE    Medication Changes: Meds ordered this encounter  Medications  . hydrALAZINE (APRESOLINE) 50 MG tablet    Sig: Take 1 tablet (50 mg total) by mouth 3 (three) times daily.    Dispense:  270 tablet    Refill:  3    02/24/19 dose increase    Disposition:  Follow up 1 year in the [place] office.  Signed, Satira Sark, MD, [facility] 02/24/2019 1:27 PM    [facility] [place], [place], [place] [address] Phone: [phone]; Fax: [phone]

## 2019-02-24 ENCOUNTER — Ambulatory Visit (INDEPENDENT_AMBULATORY_CARE_PROVIDER_SITE_OTHER): Payer: Medicare Other | Admitting: Cardiology

## 2019-02-24 ENCOUNTER — Other Ambulatory Visit: Payer: Self-pay

## 2019-02-24 ENCOUNTER — Encounter: Payer: Self-pay | Admitting: Cardiology

## 2019-02-24 VITALS — BP 171/74 | HR 54 | Temp 98.7°F | Ht 61.0 in | Wt 189.4 lb

## 2019-02-24 DIAGNOSIS — I2510 Atherosclerotic heart disease of native coronary artery without angina pectoris: Secondary | ICD-10-CM | POA: Diagnosis not present

## 2019-02-24 DIAGNOSIS — I6523 Occlusion and stenosis of bilateral carotid arteries: Secondary | ICD-10-CM

## 2019-02-24 DIAGNOSIS — R011 Cardiac murmur, unspecified: Secondary | ICD-10-CM | POA: Diagnosis not present

## 2019-02-24 DIAGNOSIS — I10 Essential (primary) hypertension: Secondary | ICD-10-CM | POA: Diagnosis not present

## 2019-02-24 DIAGNOSIS — E782 Mixed hyperlipidemia: Secondary | ICD-10-CM | POA: Diagnosis not present

## 2019-02-24 MED ORDER — HYDRALAZINE HCL 50 MG PO TABS: 50.0000 mg | ORAL_TABLET | Freq: Three times a day (TID) | ORAL | 3 refills | Status: DC

## 2019-02-24 NOTE — Patient Instructions (Addendum)
Medication Instructions:    Your physician has recommended you make the following change in your medication:   Increase hydralazine to 50 mg by mouth three times daily. You may take (2) of your 25 mg each time until they are finished.  Continue all other medications the same  Labwork:  NONE  Testing/Procedures: Your physician has requested that you have an echocardiogram. Echocardiography is a painless test that uses sound waves to create images of your heart. It provides your doctor with information about the size and shape of your heart and how well your heart's chambers and valves are working. This procedure takes approximately one hour. There are no restrictions for this procedure.  Follow-Up:  Your physician recommends that you schedule a follow-up appointment in: 1 year. You will receive a reminder letter in the mail in about 10 months reminding you to call and schedule your appointment. If you don't receive this letter, please contact our office.  Any Other Special Instructions Will Be Listed Below (If Applicable).  If you need a refill on your cardiac medications before your next appointment, please call your pharmacy.

## 2019-03-07 ENCOUNTER — Other Ambulatory Visit: Payer: Self-pay | Admitting: Family Medicine

## 2019-03-16 ENCOUNTER — Other Ambulatory Visit: Payer: Self-pay | Admitting: Family Medicine

## 2019-03-19 ENCOUNTER — Other Ambulatory Visit: Payer: Medicare Other

## 2019-03-31 ENCOUNTER — Ambulatory Visit (INDEPENDENT_AMBULATORY_CARE_PROVIDER_SITE_OTHER): Payer: Medicare Other | Admitting: Family Medicine

## 2019-03-31 ENCOUNTER — Telehealth: Payer: Self-pay | Admitting: Family Medicine

## 2019-03-31 ENCOUNTER — Encounter: Payer: Self-pay | Admitting: Family Medicine

## 2019-03-31 ENCOUNTER — Other Ambulatory Visit: Payer: Self-pay

## 2019-03-31 VITALS — BP 180/78 | HR 64 | Temp 97.1°F | Resp 15 | Ht 61.0 in | Wt 193.0 lb

## 2019-03-31 DIAGNOSIS — Z23 Encounter for immunization: Secondary | ICD-10-CM

## 2019-03-31 DIAGNOSIS — I10 Essential (primary) hypertension: Secondary | ICD-10-CM

## 2019-03-31 DIAGNOSIS — Z0001 Encounter for general adult medical examination with abnormal findings: Secondary | ICD-10-CM | POA: Diagnosis not present

## 2019-03-31 DIAGNOSIS — Z0000 Encounter for general adult medical examination without abnormal findings: Secondary | ICD-10-CM

## 2019-03-31 DIAGNOSIS — E785 Hyperlipidemia, unspecified: Secondary | ICD-10-CM | POA: Diagnosis not present

## 2019-03-31 LAB — LIPID PANEL
Cholesterol: 139 mg/dL (ref <200)
HDL: 49 mg/dL — ABNORMAL LOW (ref >=50)
LDL Cholesterol (Calc): 70 mg/dL (calc)
Non-HDL Cholesterol (Calc): 90 mg/dL (calc) (ref <130)
Total CHOL/HDL Ratio: 2.8 (calc) (ref <5.0)
Triglycerides: 113 mg/dL (ref <150)

## 2019-03-31 LAB — CBC
HCT: 29.5 % — ABNORMAL LOW (ref 35.0–45.0)
Hemoglobin: 9.4 g/dL — ABNORMAL LOW (ref 11.7–15.5)
MCH: 29.2 pg (ref 27.0–33.0)
MCHC: 31.9 g/dL — ABNORMAL LOW (ref 32.0–36.0)
MCV: 91.6 fL (ref 80.0–100.0)
MPV: 11.8 fL (ref 7.5–12.5)
Platelets: 239 10*3/uL (ref 140–400)
RBC: 3.22 10*6/uL — ABNORMAL LOW (ref 3.80–5.10)
RDW: 13.1 % (ref 11.0–15.0)
WBC: 7.7 10*3/uL (ref 3.8–10.8)

## 2019-03-31 LAB — COMPLETE METABOLIC PANEL WITH GFR
AG Ratio: 1.3 (calc) (ref 1.0–2.5)
ALT: 12 U/L (ref 6–29)
AST: 20 U/L (ref 10–35)
Albumin: 4.4 g/dL (ref 3.6–5.1)
Alkaline phosphatase (APISO): 75 U/L (ref 37–153)
BUN/Creatinine Ratio: 18 (calc) (ref 6–22)
BUN: 30 mg/dL — ABNORMAL HIGH (ref 7–25)
CO2: 26 mmol/L (ref 20–32)
Calcium: 10.6 mg/dL — ABNORMAL HIGH (ref 8.6–10.4)
Chloride: 107 mmol/L (ref 98–110)
Creat: 1.68 mg/dL — ABNORMAL HIGH (ref 0.60–0.93)
GFR, Est African American: 34 mL/min/{1.73_m2} — ABNORMAL LOW (ref >=60)
GFR, Est Non African American: 29 mL/min/{1.73_m2} — ABNORMAL LOW (ref >=60)
Globulin: 3.3 g/dL (calc) (ref 1.9–3.7)
Glucose, Bld: 111 mg/dL — ABNORMAL HIGH (ref 65–99)
Potassium: 4.8 mmol/L (ref 3.5–5.3)
Sodium: 142 mmol/L (ref 135–146)
Total Bilirubin: 0.4 mg/dL (ref 0.2–1.2)
Total Protein: 7.7 g/dL (ref 6.1–8.1)

## 2019-03-31 LAB — TSH: TSH: 2.34 mIU/L (ref 0.40–4.50)

## 2019-03-31 MED ORDER — HYDRALAZINE HCL 50 MG PO TABS: 50.0000 mg | ORAL_TABLET | Freq: Three times a day (TID) | ORAL | 3 refills | Status: DC

## 2019-03-31 NOTE — Progress Notes (Signed)
    [name] [name]     MRN: [id number]      DOB: 1940-12-04  HPI: Patient is in for annual physical exam. No other health concerns are expressed or addressed at the visit. Will update labs today Immunization is reviewed , and  updated   PE: BP (!) 180/78   Pulse 64   Temp (!) 97.1 F (36.2 C) (Temporal)   Resp 15   Ht 5\' 1"  (1.549 m)   Wt 193 lb (87.5 kg)   SpO2 98%   BMI 36.47 kg/m   Pleasant  female, alert and oriented x 3, in no cardio-pulmonary distress. Afebrile. HEENT No facial trauma or asymetry. Sinuses non tender.  Extra occullar muscles intact,  External ears normal, Neck: supple, no adenopathy,JVD or thyromegaly.No bruits.  Chest: Clear to ascultation bilaterally.No crackles or wheezes. Non tender to palpation  Breast: Not examined, asymptomatic and normal mammogram in June, 2020 Cardiovascular system; Heart sounds normal,  S1 and  S2 ,no S3.  No murmur, or thrill. Apical beat not displaced Peripheral pulses normal.  Abdomen: Soft, non tender, .obese No guarding, tenderness or rebound.  .   Musculoskeletal exam: Decreased though adequate  ROM of spine, hips , shoulders and knees. deformity ,swelling and  crepitus noted. No muscle wasting or atrophy.   Neurologic: Cranial nerves 2 to 12 intact. Power, tone ,sensation  normal throughout. disturbance in gait. No tremor.  Skin: Intact, no ulceration, erythema , scaling or rash noted. Pigmentation normal throughout  Psych; Normal mood and affect. Judgement and concentration normal   Assessment & Plan:  Annual physical exam Annual exam as documented. Counseling done  re healthy lifestyle involving commitment to 150 minutes exercise per week, heart healthy diet, and attaining healthy weight.The importance of adequate sleep also discussed. Regular seat belt use and home safety, is also discussed. Changes in health habits are decided on by the patient with goals and time frames  set for  achieving them. Immunization and cancer screening needs are specifically addressed at this visit.   Essential hypertension, malignant Uncontrolled, I messaged Cardiology recommendation is to inc either hydralazine or amlodipine. Pt will not take higher dose of amlodipine , reports swelling and " feet going black" Increase hydralazine and will need to contact her to relay this information Will bring for in office f/u in mid [place]

## 2019-03-31 NOTE — Assessment & Plan Note (Signed)

## 2019-03-31 NOTE — Assessment & Plan Note (Signed)
Uncontrolled, I messaged Cardiology recommendation is to inc either hydralazine or amlodipine. Pt will not take higher dose of amlodipine , reports swelling and " feet going black" Increase hydralazine and will need to contact her to relay this information Will bring for in office f/u in mid [place]

## 2019-03-31 NOTE — Patient Instructions (Addendum)
Follow-up with MD and last week in January call if you need me before.  Flu vaccine in office today.  Your blood pressure is still high despite recent changes in medication and I will reach out to your cardiologist for help with this.  Fasting labs today.Liipid, cmp and EGFR, CBC, TSH Please continue to practice home safety by keeping home clutter free and using adequate lighting  Thanks for choosing [facility], we consider it a privelige to serve you.

## 2019-03-31 NOTE — Telephone Encounter (Signed)
pls let pt know that I heard from Dr Domenic Polite, he recommends increasing hydralazine. She needs to start hydralazine 50 mg one and a half tablets three times daily ( currently taking one three times daily)  Needs in office BP re eval in mid November to be scheduled also since card has placed this in my court

## 2019-04-07 ENCOUNTER — Other Ambulatory Visit: Payer: Self-pay | Admitting: Family Medicine

## 2019-04-07 ENCOUNTER — Other Ambulatory Visit: Payer: Self-pay

## 2019-04-07 ENCOUNTER — Telehealth: Payer: Self-pay | Admitting: *Deleted

## 2019-04-07 MED ORDER — HYDRALAZINE HCL 50 MG PO TABS: ORAL_TABLET | ORAL | 3 refills | Status: DC

## 2019-04-07 MED ORDER — HYDRALAZINE HCL 25 MG PO TABS: 25.0000 mg | ORAL_TABLET | Freq: Three times a day (TID) | ORAL | 3 refills | Status: DC

## 2019-04-07 MED ORDER — HYDRALAZINE HCL 50 MG PO TABS: 50.0000 mg | ORAL_TABLET | Freq: Three times a day (TID) | ORAL | 3 refills | Status: DC

## 2019-04-07 NOTE — Telephone Encounter (Signed)
Pt called said she new the 50 mg of hydralazine was called in. She wanted to know if Dr Moshe Cipro would also call in 25 mg so she does not have to keep cutting these in half. She could take a 50 with a 25mg . Would like a call back

## 2019-04-07 NOTE — Telephone Encounter (Signed)
Scripts are printed and at your desk. Please directly contact pharmacy for best clarification also,  Thanks, the 25 mg script went through electronically

## 2019-04-07 NOTE — Telephone Encounter (Signed)
Patient aware.

## 2019-04-07 NOTE — Telephone Encounter (Signed)
Could this be done for patient?

## 2019-04-08 ENCOUNTER — Telehealth: Payer: Self-pay | Admitting: Family Medicine

## 2019-04-08 NOTE — Telephone Encounter (Signed)
Just clarifying, do you want patient taking both strengths of Hydralazine? The 25mg  and the 50mg  three times daily?

## 2019-04-08 NOTE — Telephone Encounter (Signed)
Spoke with pharmacist and verified both strengths are to be filled to be taken three times daily with verbal understanding.

## 2019-04-08 NOTE — Telephone Encounter (Signed)
Yes please

## 2019-04-08 NOTE — Telephone Encounter (Signed)
Pharmacy aware and will d/c the 50mg  1 1/2 tid and fill the 50mg  and 25mg  tid

## 2019-04-08 NOTE — Telephone Encounter (Signed)
2 prescriptions for hydralzine---which ones do they fill.  Caryl Pina [phone]

## 2019-04-21 ENCOUNTER — Telehealth: Payer: Self-pay | Admitting: *Deleted

## 2019-04-21 NOTE — Telephone Encounter (Signed)
Pt called and said since she has been taking the hydralazine at an increase dose her hands are tingly she is constipated one day and has diarrhea the next. Wants to know if she can decrease this a little bit so she isnt having so many side affects

## 2019-04-22 NOTE — Telephone Encounter (Signed)
Spoke with patient and advised per Dr.Simpson to try and tolerate the medicine for a little longer to see if side effects lessen. She verbalized understanding.

## 2019-04-22 NOTE — Telephone Encounter (Signed)
I would rather she try t a bit longer on  Same dose as bP is uncontrolled to see if s/e lessen, I am going by recommendation of Cardiology as far as BP management is concerned, pls explain

## 2019-04-22 NOTE — Telephone Encounter (Signed)
Please advise 

## 2019-04-23 ENCOUNTER — Other Ambulatory Visit: Payer: Medicare Other

## 2019-05-07 DIAGNOSIS — H40053 Ocular hypertension, bilateral: Secondary | ICD-10-CM | POA: Diagnosis not present

## 2019-05-07 DIAGNOSIS — H04123 Dry eye syndrome of bilateral lacrimal glands: Secondary | ICD-10-CM | POA: Diagnosis not present

## 2019-05-07 DIAGNOSIS — H2513 Age-related nuclear cataract, bilateral: Secondary | ICD-10-CM | POA: Diagnosis not present

## 2019-05-07 DIAGNOSIS — E119 Type 2 diabetes mellitus without complications: Secondary | ICD-10-CM | POA: Diagnosis not present

## 2019-05-07 LAB — HM DIABETES EYE EXAM

## 2019-05-21 ENCOUNTER — Other Ambulatory Visit: Payer: Self-pay

## 2019-05-21 ENCOUNTER — Ambulatory Visit (INDEPENDENT_AMBULATORY_CARE_PROVIDER_SITE_OTHER): Payer: Medicare Other | Admitting: Family Medicine

## 2019-05-21 ENCOUNTER — Encounter: Payer: Self-pay | Admitting: Family Medicine

## 2019-05-21 VITALS — BP 150/70 | HR 51 | Temp 98.0°F | Resp 15 | Ht 61.0 in | Wt 186.0 lb

## 2019-05-21 DIAGNOSIS — I10 Essential (primary) hypertension: Secondary | ICD-10-CM

## 2019-05-21 DIAGNOSIS — M1711 Unilateral primary osteoarthritis, right knee: Secondary | ICD-10-CM

## 2019-05-21 DIAGNOSIS — E559 Vitamin D deficiency, unspecified: Secondary | ICD-10-CM

## 2019-05-21 DIAGNOSIS — E038 Other specified hypothyroidism: Secondary | ICD-10-CM | POA: Diagnosis not present

## 2019-05-21 DIAGNOSIS — M5136 Other intervertebral disc degeneration, lumbar region: Secondary | ICD-10-CM

## 2019-05-21 DIAGNOSIS — M4316 Spondylolisthesis, lumbar region: Secondary | ICD-10-CM

## 2019-05-21 DIAGNOSIS — R7302 Impaired glucose tolerance (oral): Secondary | ICD-10-CM

## 2019-05-21 DIAGNOSIS — E785 Hyperlipidemia, unspecified: Secondary | ICD-10-CM

## 2019-05-21 DIAGNOSIS — Z951 Presence of aortocoronary bypass graft: Secondary | ICD-10-CM | POA: Diagnosis not present

## 2019-05-21 NOTE — Patient Instructions (Addendum)
F/U as before, call if you need me sooner  Bloood pressure is improved, continue current meds  Fasting lipid  cmp and EGFR, TSH and Vit D 1 week before visit  Purple walker with bench will be ordered at [facility] in [place] as requested , you do qualify!  Please be careful not to fall  Continue to practice safe habits to lower risk of Covid

## 2019-05-21 NOTE — Progress Notes (Signed)
   [name] [name]     MRN: [id number]      DOB: 1941/01/30   HPI [name] [name] is here for follow up and re-evaluation of chronic medical conditions,in particular uncontrolled hypertension medication management and review of any available recent lab and radiology data.  Preventive health is updated, specifically  Cancer screening and Immunization.   Questions or concerns regarding consultations or procedures which the PT has had in the interim are  addressed. The PT denies any adverse reactions to current medications since the last visit.  C/o exertional fatigue and reduced exercise tolerance , also feels unsteady on her feet. Requests walker with  Bench for safe ambulation both inside and outside of her home   ROS Denies recent fever or chills. Denies sinus pressure, nasal congestion, ear pain or sore throat. Denies chest congestion, productive cough or wheezing. Denies chest pains, palpitations and leg swelling Denies abdominal pain, nausea, vomiting,diarrhea or constipation.   Denies dysuria, frequency, hesitancy or incontinence.  Denies depression, anxiety or insomnia. Denies skin break down or rash.   PE  BP (!) 150/70   Pulse (!) 51   Temp 98 F (36.7 C) (Temporal)   Resp 15   Ht 5\' 1"  (1.549 m)   Wt 186 lb (84.4 kg)   SpO2 94%   BMI 35.14 kg/m   Patient alert and oriented and in no cardiopulmonary distress.  HEENT: No facial asymmetry, EOMI,     Neck supple .  Chest: Clear to auscultation bilaterally.  CVS: S1, S2 no murmurs, no S3.Regular rate.  ABD: Soft non tender.   Ext: No edema  MS: decreased  ROM spine, shoulders, hips and knees.  Skin: Intact, no ulcerations or rash noted.  Psych: Good eye contact, normal affect. Memory intact not anxious or depressed appearing.  CNS: CN 2-12 intact, power,  normal throughout.no focal deficits noted.   Assessment & Plan  Essential hypertension, malignant improved and likely the best achievable for pt based on her  age and tolerance o medications, no change in meds at this time. Weight loss, low sodium diet encouraged  Lumbar adjacent segment disease with spondylolisthesis Decreased safe mobility due to arthritis , also poor exercise tolerance due to heart failure, walker with bench needed for safe mobility  CORONARY ARTERY BYPASS GRAFT, HX OF Established CAD with increased exertional fatigue and poor exercise tolerance. Walker with seat indicated for in and outside of home use to improve quality of life and safety

## 2019-05-24 NOTE — Assessment & Plan Note (Signed)
Decreased safe mobility due to arthritis , also poor exercise tolerance due to heart failure, walker with bench needed for safe mobility

## 2019-05-24 NOTE — Assessment & Plan Note (Signed)
improved and likely the best achievable for pt based on her age and tolerance o medications, no change in meds at this time. Weight loss, low sodium diet encouraged

## 2019-05-24 NOTE — Assessment & Plan Note (Signed)
Established CAD with increased exertional fatigue and poor exercise tolerance. Walker with seat indicated for in and outside of home use to improve quality of life and safety

## 2019-06-03 ENCOUNTER — Other Ambulatory Visit: Payer: Self-pay

## 2019-06-03 ENCOUNTER — Ambulatory Visit (INDEPENDENT_AMBULATORY_CARE_PROVIDER_SITE_OTHER): Payer: Medicare Other

## 2019-06-03 DIAGNOSIS — R011 Cardiac murmur, unspecified: Secondary | ICD-10-CM | POA: Diagnosis not present

## 2019-06-03 DIAGNOSIS — I2510 Atherosclerotic heart disease of native coronary artery without angina pectoris: Secondary | ICD-10-CM | POA: Diagnosis not present

## 2019-06-04 ENCOUNTER — Telehealth: Payer: Self-pay | Admitting: *Deleted

## 2019-06-04 NOTE — Telephone Encounter (Signed)
-----   Message from Satira Sark, MD sent at 06/03/2019  3:01 PM EST ----- Results reviewed.  LVEF is normal with moderate diastolic dysfunction.  Aortic valve mildly calcified without stenosis.  Continue with current follow-up plan.

## 2019-06-04 NOTE — Telephone Encounter (Signed)
Patient informed. Copy sent to PCP °

## 2019-06-09 ENCOUNTER — Telehealth: Payer: Self-pay | Admitting: *Deleted

## 2019-06-09 NOTE — Telephone Encounter (Signed)
Called to discuss with patient. No answer, unable to leave vm. Will try again later. DME order and ov notes faxed to [facility]

## 2019-06-09 NOTE — Telephone Encounter (Signed)
Pt was checking on the walker that you sit on being ordered for her. Wanted to know if Dr Moshe Cipro had ordered it and wanted it to be sent to [facility]

## 2019-06-09 NOTE — Telephone Encounter (Signed)
Patient advised paperwork has been sent to [facility]

## 2019-06-11 DIAGNOSIS — M5136 Other intervertebral disc degeneration, lumbar region: Secondary | ICD-10-CM | POA: Diagnosis not present

## 2019-06-11 DIAGNOSIS — M1711 Unilateral primary osteoarthritis, right knee: Secondary | ICD-10-CM | POA: Diagnosis not present

## 2019-07-21 DIAGNOSIS — E038 Other specified hypothyroidism: Secondary | ICD-10-CM | POA: Diagnosis not present

## 2019-07-21 DIAGNOSIS — I10 Essential (primary) hypertension: Secondary | ICD-10-CM | POA: Diagnosis not present

## 2019-07-21 DIAGNOSIS — N1832 Chronic kidney disease, stage 3b: Secondary | ICD-10-CM | POA: Diagnosis not present

## 2019-07-21 DIAGNOSIS — Z79899 Other long term (current) drug therapy: Secondary | ICD-10-CM | POA: Diagnosis not present

## 2019-07-21 DIAGNOSIS — R809 Proteinuria, unspecified: Secondary | ICD-10-CM | POA: Diagnosis not present

## 2019-07-21 DIAGNOSIS — D631 Anemia in chronic kidney disease: Secondary | ICD-10-CM | POA: Diagnosis not present

## 2019-07-21 DIAGNOSIS — E559 Vitamin D deficiency, unspecified: Secondary | ICD-10-CM | POA: Diagnosis not present

## 2019-07-21 DIAGNOSIS — E785 Hyperlipidemia, unspecified: Secondary | ICD-10-CM | POA: Diagnosis not present

## 2019-07-21 LAB — LIPID PANEL
Cholesterol: 133 mg/dL (ref <200)
HDL: 52 mg/dL (ref >=50)
LDL Cholesterol (Calc): 61 mg/dL (calc)
Non-HDL Cholesterol (Calc): 81 mg/dL (calc) (ref <130)
Total CHOL/HDL Ratio: 2.6 (calc) (ref <5.0)
Triglycerides: 118 mg/dL (ref <150)

## 2019-07-21 LAB — COMPLETE METABOLIC PANEL WITH GFR
AG Ratio: 1.3 (calc) (ref 1.0–2.5)
ALT: 12 U/L (ref 6–29)
AST: 20 U/L (ref 10–35)
Albumin: 4.2 g/dL (ref 3.6–5.1)
Alkaline phosphatase (APISO): 72 U/L (ref 37–153)
BUN/Creatinine Ratio: 10 (calc) (ref 6–22)
BUN: 17 mg/dL (ref 7–25)
CO2: 26 mmol/L (ref 20–32)
Calcium: 10.4 mg/dL (ref 8.6–10.4)
Chloride: 107 mmol/L (ref 98–110)
Creat: 1.66 mg/dL — ABNORMAL HIGH (ref 0.60–0.93)
GFR, Est African American: 34 mL/min/{1.73_m2} — ABNORMAL LOW (ref >=60)
GFR, Est Non African American: 29 mL/min/{1.73_m2} — ABNORMAL LOW (ref >=60)
Globulin: 3.2 g/dL (calc) (ref 1.9–3.7)
Glucose, Bld: 94 mg/dL (ref 65–99)
Potassium: 4.4 mmol/L (ref 3.5–5.3)
Sodium: 141 mmol/L (ref 135–146)
Total Bilirubin: 0.4 mg/dL (ref 0.2–1.2)
Total Protein: 7.4 g/dL (ref 6.1–8.1)

## 2019-07-21 LAB — VITAMIN D 25 HYDROXY (VIT D DEFICIENCY, FRACTURES): Vit D, 25-Hydroxy: 54 ng/mL (ref 30–100)

## 2019-07-21 LAB — TSH: TSH: 4.66 mIU/L — ABNORMAL HIGH (ref 0.40–4.50)

## 2019-07-28 ENCOUNTER — Ambulatory Visit (INDEPENDENT_AMBULATORY_CARE_PROVIDER_SITE_OTHER): Payer: Medicare Other | Admitting: Family Medicine

## 2019-07-28 ENCOUNTER — Other Ambulatory Visit: Payer: Self-pay

## 2019-07-28 ENCOUNTER — Encounter: Payer: Self-pay | Admitting: Family Medicine

## 2019-07-28 VITALS — BP 143/64 | Ht 61.0 in | Wt 187.0 lb

## 2019-07-28 DIAGNOSIS — Z1231 Encounter for screening mammogram for malignant neoplasm of breast: Secondary | ICD-10-CM

## 2019-07-28 DIAGNOSIS — E038 Other specified hypothyroidism: Secondary | ICD-10-CM

## 2019-07-28 DIAGNOSIS — E785 Hyperlipidemia, unspecified: Secondary | ICD-10-CM | POA: Diagnosis not present

## 2019-07-28 DIAGNOSIS — M5136 Other intervertebral disc degeneration, lumbar region: Secondary | ICD-10-CM

## 2019-07-28 DIAGNOSIS — R7302 Impaired glucose tolerance (oral): Secondary | ICD-10-CM

## 2019-07-28 DIAGNOSIS — I10 Essential (primary) hypertension: Secondary | ICD-10-CM

## 2019-07-28 DIAGNOSIS — M4316 Spondylolisthesis, lumbar region: Secondary | ICD-10-CM

## 2019-07-28 DIAGNOSIS — K219 Gastro-esophageal reflux disease without esophagitis: Secondary | ICD-10-CM

## 2019-07-28 NOTE — Patient Instructions (Addendum)
F/U in office with MD in 6 months, call if you need me before  Please schedule and mail June mammogram appt  Excellent labs and thankful you feel well  Increase vegetables and fruits and beans and reduce sweets , red meat, butter and oils  Please get non fasting CBC, chem 7 and EGFr and TSH 1 week before July appointment  YES to the Covid vaccine  Be careful no to fall  It is important that you exercise regularly at least 30 minutes 5 times a week.Just walking around the house or in place for three 10 minute sessions makes ALL the differnce  Thanks for choosing [facility], we consider it a privelige to serve you.

## 2019-07-28 NOTE — Progress Notes (Signed)
Virtual Visit via Telephone Note  I connected with [place] on 07/28/19 at  2:00 PM EST by telephone and verified that I am speaking with the correct person using two identifiers.  Location: Patient: home Provider: office   I discussed the limitations, risks, security and privacy concerns of performing an evaluation and management service by telephone and the availability of in person appointments. I also discussed with the patient that there may be a patient responsible charge related to this service. The patient expressed understanding and agreed to proceed.   History of Present Illness: F/U chronic problems, medication review, and refill medication when necessary. Review most recent labs and order labs which are due Review preventive health and update with necessary referrals or immunizations as indicated Denies recent fever or chills. Denies sinus pressure, nasal congestion, ear pain or sore throat. Denies chest congestion, productive cough or wheezing. Denies chest pains, palpitations and leg swelling Denies abdominal pain, nausea, vomiting,diarrhea or constipation.   Denies dysuria, frequency, hesitancy or incontinence. Denies uncontrolled joint pain, swelling and limitation in mobility. Denies headaches, seizures, numbness, or tingling. Denies depression, anxiety or insomnia. Denies skin break down or rash. Keeping well , no new concerns, no complaints       Observations/Objective: BP (!) 143/64   Ht 5\' 1"  (1.549 m)   Wt 187 lb (84.8 kg)   BMI 35.33 kg/m  Good communication with no confusion and intact memory. Alert and oriented x 3 No signs of respiratory distress during speech    Assessment and Plan: Hypothyroidism May be slightly under corrected , however , no med change, review in 5 months  Essential hypertension, malignant Marked improvement , no med change DASH diet and commitment to daily physical activity for a minimum of 30 minutes discussed and  encouraged, as a part of hypertension management. The importance of attaining a healthy weight is also discussed.  BP/Weight 07/28/2019 05/21/2019 03/31/2019 02/24/2019 01/27/2019 12/02/2018 0/93/2355  Systolic BP 732 202 542 706 237 628 -  Diastolic BP 64 70 78 74 78 90 -  Wt. (Lbs) 187 186 193 189.4 195.8 192 194  BMI 35.33 35.14 36.47 35.79 33.61 32.96 36.66       GERD (gastroesophageal reflux disease) Controlled, no change in medication   Hyperlipidemia LDL goal <70 Hyperlipidemia:Low fat diet discussed and encouraged.   Lipid Panel  Lab Results  Component Value Date   CHOL 133 07/21/2019   HDL 52 07/21/2019   LDLCALC 61 07/21/2019   TRIG 118 07/21/2019   CHOLHDL 2.6 07/21/2019   Controlled, no change in medication     IGT (impaired glucose tolerance) Patient educated about the importance of limiting  Carbohydrate intake , the need to commit to daily physical activity for a minimum of 30 minutes , and to commit weight loss. The fact that changes in all these areas will reduce or eliminate all together the development of diabetes is stressed.   Diabetic Labs Latest Ref Rng & Units 07/21/2019 03/31/2019 11/26/2018 07/22/2018 04/30/2018  HbA1c <5.7 % of total Hgb - - - - -  Microalbumin 0.00 - 1.89 mg/dL - - - - -  Micro/Creat Ratio 0.0 - 30.0 mg/g - - - - -  Chol <200 mg/dL 133 139 130 - 166  HDL > OR = 50 mg/dL 52 49(L) 50 - 59  Calc LDL mg/dL (calc) 61 70 60 - 86  Triglycerides <150 mg/dL 118 113 110 - 118  Creatinine 0.60 - 0.93 mg/dL 1.66(H) 1.68(H) 1.45(H)  1.38(H) 1.85(H)   BP/Weight 07/28/2019 05/21/2019 03/31/2019 02/24/2019 01/27/2019 12/02/2018 8/63/8177  Systolic BP 116 579 038 333 832 919 -  Diastolic BP 64 70 78 74 78 90 -  Wt. 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  The importance of healthy food choices with portion control discussed, as well as eating regularly and within a 12 hour window most days. The need to choose "clean , green" food 50 to 75% of the time is discussed, as well as to make water the primary drink and set a goal of 64 ounces water daily.    Weight /BMI 07/28/2019 05/21/2019 03/31/2019  WEIGHT 187 lb 186 lb 193 lb  HEIGHT 5\' 1"  5\' 1"  5\' 1"   BMI 35.33 kg/m2 35.14 kg/m2 36.47 kg/m2       Lumbar adjacent segment disease with spondylolisthesis Home safety and fall risk reduction discussed    Follow Up Instructions:    I discussed the assessment and treatment plan with the patient. The patient was provided an opportunity to ask questions and all were answered. The patient agreed with the plan and demonstrated an understanding of the instructions.   The patient was advised to call back or seek an in-person evaluation if the symptoms worsen or if the condition fails to improve as anticipated.  I provided 22 minutes of non-face-to-face time during this encounter.   Tula Nakayama, MD

## 2019-07-30 ENCOUNTER — Encounter: Payer: Self-pay | Admitting: Family Medicine

## 2019-07-30 NOTE — Assessment & Plan Note (Signed)
Hyperlipidemia:Low fat diet discussed and encouraged.   Lipid Panel  Lab Results  Component Value Date   CHOL 133 07/21/2019   HDL 52 07/21/2019   LDLCALC 61 07/21/2019   TRIG 118 07/21/2019   CHOLHDL 2.6 07/21/2019   Controlled, no change in medication

## 2019-07-30 NOTE — Assessment & Plan Note (Signed)
May be slightly under corrected , however , no med change, review in 5 months

## 2019-07-30 NOTE — Assessment & Plan Note (Signed)
Patient educated about the importance of limiting  Carbohydrate intake , the need to commit to daily physical activity for a minimum of 30 minutes , and to commit weight loss. The fact that changes in all these areas will reduce or eliminate all together the development of diabetes is stressed.   Diabetic Labs Latest Ref Rng & Units 07/21/2019 03/31/2019 11/26/2018 07/22/2018 04/30/2018  HbA1c <5.7 % of total Hgb - - - - -  Microalbumin 0.00 - 1.89 mg/dL - - - - -  Micro/Creat Ratio 0.0 - 30.0 mg/g - - - - -  Chol <200 mg/dL 133 139 130 - 166  HDL > OR = 50 mg/dL 52 49(L) 50 - 59  Calc LDL mg/dL (calc) 61 70 60 - 86  Triglycerides <150 mg/dL 118 113 110 - 118  Creatinine 0.60 - 0.93 mg/dL 1.66(H) 1.68(H) 1.45(H) 1.38(H) 1.85(H)   BP/Weight 07/28/2019 05/21/2019 03/31/2019 02/24/2019 01/27/2019 12/02/2018 5/95/3967  Systolic BP 289 791 504 136 438 377 -  Diastolic BP 64 70 78 74 78 90 -  Wt. (Lbs) 187 186 193 189.4 195.8 192 194  BMI 35.33 35.14 36.47 35.79 33.61 32.96 36.66   Foot/eye exam completion dates Latest Ref Rng & Units 05/07/2019 04/12/2016  Eye Exam No Retinopathy No Retinopathy No Retinopathy  Foot exam Order - - -  Foot Form Completion - - -

## 2019-07-30 NOTE — Assessment & Plan Note (Signed)
Controlled, no change in medication  

## 2019-07-30 NOTE — Assessment & Plan Note (Signed)
Home safety and fall risk reduction discussed 

## 2019-07-30 NOTE — Assessment & Plan Note (Signed)
Marked improvement , no med change DASH diet and commitment to daily physical activity for a minimum of 30 minutes discussed and encouraged, as a part of hypertension management. The importance of attaining a healthy weight is also discussed.  BP/Weight 07/28/2019 05/21/2019 03/31/2019 02/24/2019 01/27/2019 12/02/2018 12/30/1001  Systolic BP 496 116 435 391 225 834 -  Diastolic BP 64 70 78 74 78 90 -  Wt. (Lbs) 187 186 193 189.4 195.8 192 194  BMI 35.33 35.14 36.47 35.79 33.61 32.96 36.66

## 2019-07-30 NOTE — Assessment & Plan Note (Signed)
Obesity linked with hTN and arthritis  Patient re-educated about  the importance of commitment to a  minimum of 150 minutes of exercise per week as able.  The importance of healthy food choices with portion control discussed, as well as eating regularly and within a 12 hour window most days. The need to choose "clean , green" food 50 to 75% of the time is discussed, as well as to make water the primary drink and set a goal of 64 ounces water daily.    Weight /BMI 07/28/2019 05/21/2019 03/31/2019  WEIGHT 187 lb 186 lb 193 lb  HEIGHT 5\' 1"  5\' 1"  5\' 1"   BMI 35.33 kg/m2 35.14 kg/m2 36.47 kg/m2

## 2019-08-03 ENCOUNTER — Other Ambulatory Visit: Payer: Self-pay | Admitting: Family Medicine

## 2019-08-19 DIAGNOSIS — N1832 Chronic kidney disease, stage 3b: Secondary | ICD-10-CM | POA: Diagnosis not present

## 2019-08-19 DIAGNOSIS — R809 Proteinuria, unspecified: Secondary | ICD-10-CM | POA: Diagnosis not present

## 2019-08-19 DIAGNOSIS — D631 Anemia in chronic kidney disease: Secondary | ICD-10-CM | POA: Diagnosis not present

## 2019-08-19 DIAGNOSIS — I129 Hypertensive chronic kidney disease with stage 1 through stage 4 chronic kidney disease, or unspecified chronic kidney disease: Secondary | ICD-10-CM | POA: Diagnosis not present

## 2019-08-19 DIAGNOSIS — D508 Other iron deficiency anemias: Secondary | ICD-10-CM | POA: Diagnosis not present

## 2019-08-24 ENCOUNTER — Encounter (HOSPITAL_COMMUNITY): Payer: Self-pay

## 2019-08-24 ENCOUNTER — Encounter (HOSPITAL_COMMUNITY)
Admission: RE | Admit: 2019-08-24 | Discharge: 2019-08-24 | Disposition: A | Discharge status: Home / Self Care | Payer: Medicare Other | Source: Ambulatory Visit | Attending: Nephrology | Admitting: Nephrology

## 2019-08-24 ENCOUNTER — Other Ambulatory Visit: Payer: Self-pay

## 2019-08-24 DIAGNOSIS — N1832 Chronic kidney disease, stage 3b: Secondary | ICD-10-CM | POA: Insufficient documentation

## 2019-08-24 DIAGNOSIS — D509 Iron deficiency anemia, unspecified: Secondary | ICD-10-CM | POA: Diagnosis not present

## 2019-08-24 MED ORDER — SODIUM CHLORIDE 0.9 % IV SOLN: Freq: Once | INTRAVENOUS | Status: AC

## 2019-08-24 MED ORDER — SODIUM CHLORIDE 0.9 % IV SOLN
510.0000 mg | Freq: Once | INTRAVENOUS | Status: AC
  Administered 2019-08-24: 510 mg via INTRAVENOUS
  Filled 2019-08-24: qty 17

## 2019-08-31 ENCOUNTER — Encounter (HOSPITAL_COMMUNITY)
Admission: RE | Admit: 2019-08-31 | Discharge: 2019-08-31 | Disposition: A | Discharge status: Home / Self Care | Payer: Medicare Other | Source: Ambulatory Visit | Attending: Nephrology | Admitting: Nephrology

## 2019-08-31 ENCOUNTER — Encounter (HOSPITAL_COMMUNITY): Payer: Self-pay

## 2019-08-31 ENCOUNTER — Other Ambulatory Visit: Payer: Self-pay

## 2019-08-31 DIAGNOSIS — D509 Iron deficiency anemia, unspecified: Secondary | ICD-10-CM | POA: Insufficient documentation

## 2019-08-31 DIAGNOSIS — N1832 Chronic kidney disease, stage 3b: Secondary | ICD-10-CM | POA: Insufficient documentation

## 2019-08-31 MED ORDER — SODIUM CHLORIDE 0.9 % IV SOLN
510.0000 mg | Freq: Once | INTRAVENOUS | Status: AC
  Administered 2019-08-31: 10:00:00 510 mg via INTRAVENOUS
  Filled 2019-08-31: qty 17

## 2019-08-31 MED ORDER — SODIUM CHLORIDE 0.9 % IV SOLN: Freq: Once | INTRAVENOUS | Status: AC

## 2019-09-01 ENCOUNTER — Other Ambulatory Visit: Payer: Self-pay | Admitting: Family Medicine

## 2019-09-02 DIAGNOSIS — N1832 Chronic kidney disease, stage 3b: Secondary | ICD-10-CM | POA: Diagnosis not present

## 2019-09-02 DIAGNOSIS — I129 Hypertensive chronic kidney disease with stage 1 through stage 4 chronic kidney disease, or unspecified chronic kidney disease: Secondary | ICD-10-CM | POA: Diagnosis not present

## 2019-09-02 DIAGNOSIS — D631 Anemia in chronic kidney disease: Secondary | ICD-10-CM | POA: Diagnosis not present

## 2019-09-02 DIAGNOSIS — N189 Chronic kidney disease, unspecified: Secondary | ICD-10-CM | POA: Diagnosis not present

## 2019-09-02 DIAGNOSIS — R809 Proteinuria, unspecified: Secondary | ICD-10-CM | POA: Diagnosis not present

## 2019-09-03 ENCOUNTER — Ambulatory Visit (INDEPENDENT_AMBULATORY_CARE_PROVIDER_SITE_OTHER): Payer: Medicare Other

## 2019-09-03 ENCOUNTER — Other Ambulatory Visit: Payer: Self-pay | Admitting: Cardiology

## 2019-09-03 ENCOUNTER — Other Ambulatory Visit: Payer: Self-pay

## 2019-09-03 DIAGNOSIS — I6523 Occlusion and stenosis of bilateral carotid arteries: Secondary | ICD-10-CM

## 2019-09-08 ENCOUNTER — Other Ambulatory Visit: Payer: Self-pay | Admitting: Family Medicine

## 2019-09-08 ENCOUNTER — Telehealth: Payer: Self-pay | Admitting: *Deleted

## 2019-09-08 DIAGNOSIS — I6523 Occlusion and stenosis of bilateral carotid arteries: Secondary | ICD-10-CM

## 2019-09-08 NOTE — Telephone Encounter (Signed)
Patient informed and verbalized understanding. Copy sent to PCP 

## 2019-09-08 NOTE — Telephone Encounter (Signed)
-----   Message from Satira Sark, MD sent at 09/06/2019  4:18 PM EST ----- Results reviewed.  Carotid artery disease has progressed compared to the last study, now 60 to 79% RICA and 40 to 33% LICA.  She is still on appropriate medical therapy.  Would recommend a follow-up carotid Doppler in 6 months rather than 1 year to ensure stability.

## 2019-09-25 ENCOUNTER — Other Ambulatory Visit: Payer: Self-pay

## 2019-09-25 NOTE — Patient Outreach (Signed)
[facility]) Care Management  09/25/2019  [name] [name] 03-29-1941 [id number]   Medication Adherence call to Mrs. [name] [name] spoke with patient,patient did not think it was legit call and hung up. Mrs. [name] [name] is showing past due on Losartan 25 mg under [place].   [place] Management Direct Dial [phone]  Fax [phone] [email or web address]

## 2019-09-28 ENCOUNTER — Other Ambulatory Visit: Payer: Self-pay | Admitting: Family Medicine

## 2019-10-05 ENCOUNTER — Other Ambulatory Visit: Payer: Self-pay | Admitting: Family Medicine

## 2019-10-15 DIAGNOSIS — N1832 Chronic kidney disease, stage 3b: Secondary | ICD-10-CM | POA: Diagnosis not present

## 2019-10-15 DIAGNOSIS — Z79899 Other long term (current) drug therapy: Secondary | ICD-10-CM | POA: Diagnosis not present

## 2019-10-15 DIAGNOSIS — R809 Proteinuria, unspecified: Secondary | ICD-10-CM | POA: Diagnosis not present

## 2019-10-15 DIAGNOSIS — D631 Anemia in chronic kidney disease: Secondary | ICD-10-CM | POA: Diagnosis not present

## 2019-10-15 DIAGNOSIS — E559 Vitamin D deficiency, unspecified: Secondary | ICD-10-CM | POA: Diagnosis not present

## 2019-10-28 ENCOUNTER — Other Ambulatory Visit: Payer: Self-pay | Admitting: Cardiology

## 2019-10-28 DIAGNOSIS — I129 Hypertensive chronic kidney disease with stage 1 through stage 4 chronic kidney disease, or unspecified chronic kidney disease: Secondary | ICD-10-CM | POA: Diagnosis not present

## 2019-10-28 DIAGNOSIS — D508 Other iron deficiency anemias: Secondary | ICD-10-CM | POA: Diagnosis not present

## 2019-10-28 DIAGNOSIS — D631 Anemia in chronic kidney disease: Secondary | ICD-10-CM | POA: Diagnosis not present

## 2019-10-28 DIAGNOSIS — R809 Proteinuria, unspecified: Secondary | ICD-10-CM | POA: Diagnosis not present

## 2019-10-28 DIAGNOSIS — N1832 Chronic kidney disease, stage 3b: Secondary | ICD-10-CM | POA: Diagnosis not present

## 2019-11-03 DIAGNOSIS — H40053 Ocular hypertension, bilateral: Secondary | ICD-10-CM | POA: Diagnosis not present

## 2019-11-12 ENCOUNTER — Encounter: Payer: Medicare Other | Admitting: Family Medicine

## 2019-11-16 ENCOUNTER — Other Ambulatory Visit: Payer: Self-pay

## 2019-11-16 ENCOUNTER — Telehealth (INDEPENDENT_AMBULATORY_CARE_PROVIDER_SITE_OTHER): Payer: Medicare Other

## 2019-11-16 VITALS — BP 144/63 | Ht 61.0 in | Wt 188.0 lb

## 2019-11-16 DIAGNOSIS — Z0000 Encounter for general adult medical examination without abnormal findings: Secondary | ICD-10-CM

## 2019-11-16 DIAGNOSIS — Z780 Asymptomatic menopausal state: Secondary | ICD-10-CM | POA: Diagnosis not present

## 2019-11-16 NOTE — Progress Notes (Signed)
Subjective:   Yarisbel PERSAIS ETHRIDGE is a 79 y.o. female who presents for Medicare Annual (Subsequent) preventive examination.  Review of Systems:   Cardiac Risk Factors include: advanced age (>33men, >68 women);dyslipidemia;obesity (BMI >30kg/m2);hypertension;smoking/ tobacco exposure     Objective:     Vitals: BP (!) 144/63   Ht 5\' 1"  (1.549 m)   Wt 188 lb (85.3 kg)   BMI 35.52 kg/m   Body mass index is 35.52 kg/m.  Advanced Directives 02/26/2018 02/26/2018 12/21/2016 09/20/2016 12/07/2014 02/04/2013  Does Patient Have a Medical Advance Directive? No No No No No Patient does not have advance directive;Patient would not like information  Would patient like information on creating a medical advance directive? No - Patient declined - No - Patient declined Yes (MAU/Ambulatory/Procedural Areas - Information given) No - patient declined information -  Pre-existing out of facility DNR order (yellow form or pink MOST form) - - - - - No    Tobacco Social History   Tobacco Use  Smoking Status Former Smoker  . Packs/day: 0.25  . Years: 40.00  . Pack years: 10.00  . Types: Cigarettes  . Quit date: 07/03/2003  . Years since quitting: 16.3  Smokeless Tobacco Never Used     Counseling given: Not Answered   Clinical Intake:  Pre-visit preparation completed: Yes  Pain : No/denies pain Pain Score: 0-No pain     Nutritional Status: BMI > 30  Obese Diabetes: No  How often do you need to have someone help you when you read instructions, pamphlets, or other written materials from your doctor or pharmacy?: 1 - Never  Interpreter Needed?: No  Information entered by :: Limuel Nieblas LPN  Past Medical History:  Diagnosis Date  . 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 Carotid artery disease ([place])   . CKD (chronic kidney disease), stage III   . Essential hypertension   . Glaucoma    Laser surgery 2010  . Hyperlipidemia   . Hypothyroidism   . Iron  deficiency anemia   . Obesity    Past Surgical History:  Procedure Laterality Date  . COLONOSCOPY N/A 02/04/2013   Procedure: COLONOSCOPY;  Surgeon: Rogene Houston, MD;  [facility];  Service: Endoscopy;  Laterality: N/A;  1015  . COLONOSCOPY N/A 12/21/2016   Procedure: COLONOSCOPY;  Surgeon: Rogene [place], MD;  [facility];  Service: Endoscopy;  Laterality: N/A;  . CORONARY ARTERY BYPASS GRAFT  October 2005   Dr. Roxy Manns - LIMA to LAD, SVG to OM 2 and OM 3, SVG to PDA  . ESOPHAGOGASTRODUODENOSCOPY N/A 12/21/2016   Procedure: ESOPHAGOGASTRODUODENOSCOPY (EGD);  Surgeon: Rogene [place], MD;  [facility];  Service: Endoscopy;  Laterality: N/A;  11:10  . ESOPHAGOGASTRODUODENOSCOPY N/A 02/27/2018   Procedure: ESOPHAGOGASTRODUODENOSCOPY (EGD);  Surgeon: Rogene [place], MD;  [facility];  Service: Endoscopy;  Laterality: N/A;  . EYE SURGERY  2010   laser treatment to both eyes for glaucoma  . GIVENS CAPSULE STUDY N/A 03/07/2018   Procedure: GIVENS CAPSULE STUDY;  Surgeon: Rogene Houston, MD;  [facility];  Service: Endoscopy;  Laterality: N/A;   Family History  Problem Relation Age of Onset  . Aneurysm Mother   . Hypertension Mother   . Coronary artery disease Mother   . Coronary artery disease Father   . Diabetes Son   . Pancreatic cancer Son   . Colon cancer  Brother 53       lived 18 months afterwards  . Liver cancer Brother 97  . Diabetes Sister   . Coronary artery disease Sister   . Diverticulosis Sister   . COPD Sister   . Kidney disease Sister   . Diabetes Sister   . Hypertension Sister   . Breast cancer Sister 70  . Hearing loss Sister   . Heart Problems Sister        pacemaker   Social History   Socioeconomic History  . Marital status: Married    Spouse name: Not on file  . Number of children: 1  . Years of education: Not on file  . Highest education level: Not on file  Occupational History  . Occupation:  retired   Tobacco Use  . Smoking status: Former Smoker    Packs/day: 0.25    Years: 40.00    Pack years: 10.00    Types: Cigarettes    Quit date: 07/03/2003    Years since quitting: 16.3  . Smokeless tobacco: Never Used  Substance and Sexual Activity  . Alcohol use: No    Alcohol/week: 0.0 standard drinks  . Drug use: No  . Sexual activity: Never  Other Topics Concern  . Not on file  Social History Narrative  . Not on file   Social Determinants of Health   Financial Resource Strain: Low Risk   . Difficulty of Paying Living Expenses: Not hard at all  Food Insecurity: No Food Insecurity  . Worried About Charity fundraiser in the Last Year: Never true  . Ran Out of Food in the Last Year: Never true  Transportation Needs: No Transportation Needs  . Lack of Transportation (Medical): No  . Lack of Transportation (Non-Medical): No  Physical Activity: Insufficiently Active  . Days of Exercise per Week: 5 days  . Minutes of Exercise per Session: 10 min  Stress: No Stress Concern Present  . Feeling of Stress : Not at all  Social Connections: Not Isolated  . Frequency of Communication with Friends and Family: More than three times a week  . Frequency of Social Gatherings with Friends and Family: More than three times a week  . Attends Religious Services: More than 4 times per year  . Active Member of Clubs or Organizations: Yes  . Attends Archivist Meetings: More than 4 times per year  . Marital Status: Married    Outpatient Encounter Medications as of 11/16/2019  Medication Sig  . acetaminophen (TYLENOL) 500 MG tablet Take 1,000 mg by mouth every 6 (six) hours as needed for mild pain or headache.   Marland Kitchen amLODipine (NORVASC) 5 MG tablet TAKE ONE TABLET BY MOUTH DAILY.  Marland Kitchen aspirin 81 MG tablet Take 1 tablet (81 mg total) by mouth daily. Restart on 03/02/18  . cetirizine (ZYRTEC) 10 MG tablet TAKE ONE TABLET BY MOUTH DAILY.  Marland Kitchen Cholecalciferol (VITAMIN D3) 1000 UNITS CAPS Take  1 capsule by mouth daily.  . fish oil-omega-3 fatty acids 1000 MG capsule Take 1 g by mouth daily.   . fluticasone (FLONASE) 50 MCG/ACT nasal spray USE 2 SPRAYS IN EACH NOSTRIL ONCE DAILY.  SHAKE GENTLY BEFORE USING. (Patient taking differently: USE 1 SPRAYS IN EACH NOSTRIL ONCE DAILY AS NEEDED FOR ALLERGIES.  SHAKE GENTLY BEFORE USING.)  . hydrALAZINE (APRESOLINE) 25 MG tablet TAKE ONE TABLET BY MOUTH THREE TIMES DAILY  . hydrALAZINE (APRESOLINE) 50 MG tablet Take 1 tablet (50 mg total) by mouth 3 (three)  times daily.  Marland Kitchen latanoprost (XALATAN) 0.005 % ophthalmic solution Place 1 drop into both eyes at bedtime. One drop each eye at bedtime   . levothyroxine (SYNTHROID) 75 MCG tablet TAKE ONE AND ONE-HALF TABLETS BY MOUTH EVERY MONDAY, WEDNESDAY AND FRIDAY. TAKE ONE TABLET BY MOUTH DAILY ON TUESDAY, THURSDAY, SATURDAY AND SUNDAY.  . metoprolol succinate (TOPROL-XL) 100 MG 24 hr tablet TAKE ONE TABLET BY MOUTH DAILY. TAKE WITH OR IMMEDIATELY FOLLOWING A MEAL.  . Misc. Devices (WALL GRAB BAR) MISC Grab bar for shower x 2 DX high fall risk  . Multiple Vitamins-Minerals (THERATRUM COMPLETE 50 PLUS PO) Take 1 tablet by mouth daily.   . nitroGLYCERIN (NITROSTAT) 0.4 MG SL tablet DISSOLVE ONE TABLET UNDER TONGUE EVERY 5 MINUTES UP TO 3 DOSES AS NEEDED FOR CHEST PAIN.  Marland Kitchen omeprazole (PRILOSEC) 40 MG capsule TAKE ONE CAPSULE BY MOUTH DAILY.  Marland Kitchen Pediatric Multivitamins-Iron (FLINTSTONES COMPLETE PO) Take 2 tablets by mouth daily. With iron  . Polyethyl Glycol-Propyl Glycol (SYSTANE ULTRA OP) Apply 1 drop to eye as needed (dry eyes).  . rosuvastatin (CRESTOR) 10 MG tablet TAKE ONE TABLET BY MOUTH DAILY.   No facility-administered encounter medications on file as of 11/16/2019.    Activities of Daily Living In your present state of health, do you have any difficulty performing the following activities: 11/16/2019  Hearing? N  Vision? N  Difficulty concentrating or making decisions? N  Walking or climbing  stairs? Y  Dressing or bathing? N  Doing errands, shopping? N  Preparing Food and eating ? N  Using the Toilet? N  In the past six months, have you accidently leaked urine? N  Do you have problems with loss of bowel control? N  Managing your Medications? N  Managing your Finances? N  Housekeeping or managing your Housekeeping? N  Some recent data might be hidden    Patient Care Team: Fayrene Helper, MD as PCP - General Domenic Polite Aloha Gell, MD as PCP - Cardiology (Cardiology) Carlena Bjornstad, MD as Consulting Physician (Cardiology) Warden Fillers, MD as Consulting Physician (Optometry) Steffanie Rainwater, DPM as Consulting Physician (Podiatry)    Assessment:   This is a routine wellness examination for Braylin.  Exercise Activities and Dietary recommendations Current Exercise Habits: Home exercise routine, Type of exercise: walking, Time (Minutes): 15, Frequency (Times/Week): 5, Weekly Exercise (Minutes/Week): 75, Intensity: Mild, Exercise limited by: orthopedic condition(s);cardiac condition(s)  Goals    . DIET - INCREASE WATER INTAKE    . Exercise 3x per week (30 min per time)     Recommend chair exercises 3 times a week for 30-45 minutes at a time as tolerated.    Marland Kitchen LIFESTYLE - DECREASE FALLS RISK     Use cane when ambulating         Fall Risk Fall Risk  11/16/2019 07/28/2019 05/21/2019 03/31/2019 01/27/2019  Falls in the past year? 0 0 0 0 0  Number falls in past yr: 0 0 0 0 -  Injury with Fall? 0 0 0 0 0   Is the patient's home free of loose throw rugs in walkways, pet beds, electrical cords, etc?   yes      Grab bars in the bathroom? yes      Handrails on the stairs?   yes      Adequate lighting?   yes  Timed Get Up and Go performed: unable due to virtual visit   Depression Screen [facility] 2/9 Scores 01/27/2019 12/02/2018 11/11/2018 08/04/2018  PHQ -  2 Score 0 0 0 0  PHQ- 9 Score - - - -     Cognitive Function     6CIT Screen 11/16/2019 11/11/2018 11/08/2017 09/20/2016    What Year? 0 points 0 points 0 points 0 points  What month? 0 points 0 points 0 points 0 points  What time? 0 points 0 points 0 points 0 points  Count back from 20 0 points 0 points 0 points 0 points  Months in reverse 0 points 0 points 0 points 0 points  Repeat phrase 0 points 4 points 0 points 0 points  Total Score 0 4 0 0    Immunization History  Administered Date(s) Administered  . Fluad Quad(high Dose 65+) 03/31/2019  . Influenza Split 03/11/2012  . Influenza,inj,Quad PF,6+ Mos 04/02/2013, 05/17/2014, 06/29/2015, 06/27/2016, 02/25/2017, 03/04/2018  . PFIZER SARS-COV-2 Vaccination 08/07/2019, 08/28/2019  . Pneumococcal Conjugate-13 01/04/2014  . Pneumococcal Polysaccharide-23 09/25/2007  . Td 09/25/2007  . Zoster 03/12/2012    Qualifies for Shingles Vaccine? Can check with pharmacy to get vaccines   Screening Tests Health Maintenance  Topic Date Due  . TETANUS/TDAP  03/30/2020 (Originally 09/24/2017)  . INFLUENZA VACCINE  01/31/2020  . DEXA SCAN  Completed  . COVID-19 Vaccine  Completed  . PNA vac Low Risk Adult  Completed    Cancer Screenings: Lung: Low Dose CT Chest recommended if Age 88-80 years, 30 pack-year currently smoking OR have quit w/in 15years. Patient does not qualify. Breast:  Up to date on Mammogram? Yes   Up to date of Bone Density/Dexa? Yes ordered Colorectal: up to date   Additional Screenings:  Hepatitis C Screening: once recommended      Plan:     I have personally reviewed and noted the following in the patient's chart:   . Medical and social history . Use of alcohol, tobacco or illicit drugs  . Current medications and supplements . Functional ability and status . Nutritional status . Physical activity . Advanced directives . List of other physicians . Hospitalizations, surgeries, and ER visits in previous 12 months . Vitals . Screenings to include cognitive, depression, and falls . Referrals and appointments  In addition, I have  reviewed and discussed with patient certain preventive protocols, quality metrics, and best practice recommendations. A written personalized care plan for preventive services as well as general preventive health recommendations were provided to patient.     Kate Sable, LPN, LPN  [id number]/63/0160

## 2019-11-16 NOTE — Patient Instructions (Signed)
[name] [name] , Thank you for taking time to come for your Medicare Wellness Visit. I appreciate your ongoing commitment to your health goals. Please review the following plan we discussed and let me know if I can assist you in the future.   Screening recommendations/referrals: Colonoscopy: up to date  Mammogram: up to date  Bone Density: ordered  Recommended yearly ophthalmology/optometry visit for glaucoma screening and checkup Recommended yearly dental visit for hygiene and checkup  Vaccinations: Influenza vaccine: up to date  Pneumococcal vaccine: up to date  Tdap vaccine: up to date  Shingles vaccine: can get at [place] 65 Years and Older, Female Preventive care refers to lifestyle choices and visits with your health care provider that can promote health and wellness. What does preventive care include?  A yearly physical exam. This is also called an annual well check.  Dental exams once or twice a year.  Routine eye exams. Ask your health care provider how often you should have your eyes checked.  Personal lifestyle choices, including:  Daily care of your teeth and gums.  Regular physical activity.  Eating a healthy diet.  Avoiding tobacco and drug use.  Limiting alcohol use.  Practicing safe sex.  Taking low-dose aspirin every day.  Taking vitamin and mineral supplements as recommended by your health care provider. What happens during an annual well check? The services and screenings done by your health care provider during your annual well check will depend on your age, overall health, lifestyle risk factors, and family history of disease. Counseling  Your health care provider may ask you questions about your:  Alcohol use.  Tobacco use.  Drug use.  Emotional well-being.  Home and relationship well-being.  Sexual activity.  Eating habits.  History of falls.  Memory and ability to understand (cognition).  Work and work  Statistician.  Reproductive health. Screening  You may have the following tests or measurements:  Height, weight, and BMI.  Blood pressure.  Lipid and cholesterol levels. These may be checked every 5 years, or more frequently if you are over 23 years old.  Skin check.  Lung cancer screening. You may have this screening every year starting at age 74 if you have a 30-pack-year history of smoking and currently smoke or have quit within the past 15 years.  Fecal occult blood test (FOBT) of the stool. You may have this test every year starting at age 48.  Flexible sigmoidoscopy or colonoscopy. You may have a sigmoidoscopy every 5 years or a colonoscopy every 10 years starting at age 58.  Hepatitis C blood test.  Hepatitis B blood test.  Sexually transmitted disease (STD) testing.  Diabetes screening. This is done by checking your blood sugar (glucose) after you have not eaten for a while (fasting). You may have this done every 1-3 years.  Bone density scan. This is done to screen for osteoporosis. You may have this done starting at age 54.  Mammogram. This may be done every 1-2 years. Talk to your health care provider about how often you should have regular mammograms. Talk with your health care provider about your test results, treatment options, and if necessary, the need for more tests. Vaccines  Your health care provider may recommend certain vaccines, such as:  Influenza vaccine. This is recommended every year.  Tetanus, diphtheria, and acellular pertussis (Tdap, Td) vaccine. You may need a Td booster every 10 years.  Zoster vaccine. You may need this after age  60.  Pneumococcal 13-valent conjugate (PCV13) vaccine. One dose is recommended after age 50.  Pneumococcal polysaccharide (PPSV23) vaccine. One dose is recommended after age 12. Talk to your health care provider about which screenings and vaccines you need and how often you need them. This information is not  intended to replace advice given to you by your health care provider. Make sure you discuss any questions you have with your health care provider. Document Released: 07/15/2015 Document Revised: 03/07/2016 Document Reviewed: 04/19/2015 Elsevier Interactive Patient Education  2017 [employer] Prevention in the Home Falls can cause injuries. They can happen to people of all ages. There are many things you can do to make your home safe and to help prevent falls. What can I do on the outside of my home?  Regularly fix the edges of walkways and driveways and fix any cracks.  Remove anything that might make you trip as you walk through a door, such as a raised step or threshold.  Trim any bushes or trees on the path to your home.  Use bright outdoor lighting.  Clear any walking paths of anything that might make someone trip, such as rocks or tools.  Regularly check to see if handrails are loose or broken. Make sure that both sides of any steps have handrails.  Any raised decks and porches should have guardrails on the edges.  Have any leaves, snow, or ice cleared regularly.  Use sand or salt on walking paths during winter.  Clean up any spills in your garage right away. This includes oil or grease spills. What can I do in the bathroom?  Use night lights.  Install grab bars by the toilet and in the tub and shower. Do not use towel bars as grab bars.  Use non-skid mats or decals in the tub or shower.  If you need to sit down in the shower, use a plastic, non-slip stool.  Keep the floor dry. Clean up any water that spills on the floor as soon as it happens.  Remove soap buildup in the tub or shower regularly.  Attach bath mats securely with double-sided non-slip rug tape.  Do not have throw rugs and other things on the floor that can make you trip. What can I do in the bedroom?  Use night lights.  Make sure that you have a light by your bed that is easy to reach.  Do  not use any sheets or blankets that are too big for your bed. They should not hang down onto the floor.  Have a firm chair that has side arms. You can use this for support while you get dressed.  Do not have throw rugs and other things on the floor that can make you trip. What can I do in the kitchen?  Clean up any spills right away.  Avoid walking on wet floors.  Keep items that you use a lot in easy-to-reach places.  If you need to reach something above you, use a strong step stool that has a grab bar.  Keep electrical cords out of the way.  Do not use floor polish or wax that makes floors slippery. If you must use wax, use non-skid floor wax.  Do not have throw rugs and other things on the floor that can make you trip. What can I do with my stairs?  Do not leave any items on the stairs.  Make sure that there are handrails on both sides of the stairs and  use them. Fix handrails that are broken or loose. Make sure that handrails are as long as the stairways.  Check any carpeting to make sure that it is firmly attached to the stairs. Fix any carpet that is loose or worn.  Avoid having throw rugs at the top or bottom of the stairs. If you do have throw rugs, attach them to the floor with carpet tape.  Make sure that you have a light switch at the top of the stairs and the bottom of the stairs. If you do not have them, ask someone to add them for you. What else can I do to help prevent falls?  Wear shoes that:  Do not have high heels.  Have rubber bottoms.  Are comfortable and fit you well.  Are closed at the toe. Do not wear sandals.  If you use a stepladder:  Make sure that it is fully opened. Do not climb a closed stepladder.  Make sure that both sides of the stepladder are locked into place.  Ask someone to hold it for you, if possible.  Clearly mark and make sure that you can see:  Any grab bars or handrails.  First and last steps.  Where the edge of each  step is.  Use tools that help you move around (mobility aids) if they are needed. These include:  Canes.  Walkers.  Scooters.  Crutches.  Turn on the lights when you go into a dark area. Replace any light bulbs as soon as they burn out.  Set up your furniture so you have a clear path. Avoid moving your furniture around.  If any of your floors are uneven, fix them.  If there are any pets around you, be aware of where they are.  Review your medicines with your doctor. Some medicines can make you feel dizzy. This can increase your chance of falling. Ask your doctor what other things that you can do to help prevent falls. This information is not intended to replace advice given to you by your health care provider. Make sure you discuss any questions you have with your health care provider. Document Released: 04/14/2009 Document Revised: 11/24/2015 Document Reviewed: 07/23/2014 Elsevier Interactive Patient Education  2017 [employer].

## 2019-12-01 ENCOUNTER — Other Ambulatory Visit: Payer: Self-pay | Admitting: Family Medicine

## 2019-12-05 ENCOUNTER — Other Ambulatory Visit: Payer: Self-pay | Admitting: Family Medicine

## 2019-12-10 ENCOUNTER — Encounter (INDEPENDENT_AMBULATORY_CARE_PROVIDER_SITE_OTHER): Payer: Self-pay | Admitting: Gastroenterology

## 2019-12-15 ENCOUNTER — Other Ambulatory Visit: Payer: Self-pay | Admitting: *Deleted

## 2019-12-21 ENCOUNTER — Ambulatory Visit (HOSPITAL_COMMUNITY)
Admission: RE | Admit: 2019-12-21 | Discharge: 2019-12-21 | Disposition: A | Discharge status: Home / Self Care | Payer: Medicare Other | Source: Ambulatory Visit | Attending: Family Medicine | Admitting: Family Medicine

## 2019-12-21 ENCOUNTER — Other Ambulatory Visit: Payer: Self-pay

## 2019-12-21 DIAGNOSIS — Z1231 Encounter for screening mammogram for malignant neoplasm of breast: Secondary | ICD-10-CM | POA: Diagnosis not present

## 2019-12-21 DIAGNOSIS — Z780 Asymptomatic menopausal state: Secondary | ICD-10-CM

## 2019-12-28 ENCOUNTER — Other Ambulatory Visit: Payer: Self-pay | Admitting: Family Medicine

## 2020-01-18 DIAGNOSIS — N1832 Chronic kidney disease, stage 3b: Secondary | ICD-10-CM | POA: Diagnosis not present

## 2020-01-18 DIAGNOSIS — I129 Hypertensive chronic kidney disease with stage 1 through stage 4 chronic kidney disease, or unspecified chronic kidney disease: Secondary | ICD-10-CM | POA: Diagnosis not present

## 2020-01-18 DIAGNOSIS — E038 Other specified hypothyroidism: Secondary | ICD-10-CM | POA: Diagnosis not present

## 2020-01-18 DIAGNOSIS — I10 Essential (primary) hypertension: Secondary | ICD-10-CM | POA: Diagnosis not present

## 2020-01-18 DIAGNOSIS — R809 Proteinuria, unspecified: Secondary | ICD-10-CM | POA: Diagnosis not present

## 2020-01-18 DIAGNOSIS — D508 Other iron deficiency anemias: Secondary | ICD-10-CM | POA: Diagnosis not present

## 2020-01-18 DIAGNOSIS — N189 Chronic kidney disease, unspecified: Secondary | ICD-10-CM | POA: Diagnosis not present

## 2020-01-18 LAB — CBC
HCT: 23.5 % — ABNORMAL LOW (ref 35.0–45.0)
Hemoglobin: 7.5 g/dL — ABNORMAL LOW (ref 11.7–15.5)
MCH: 29.4 pg (ref 27.0–33.0)
MCHC: 31.9 g/dL — ABNORMAL LOW (ref 32.0–36.0)
MCV: 92.2 fL (ref 80.0–100.0)
MPV: 11.4 fL (ref 7.5–12.5)
Platelets: 245 10*3/uL (ref 140–400)
RBC: 2.55 10*6/uL — ABNORMAL LOW (ref 3.80–5.10)
RDW: 13.3 % (ref 11.0–15.0)
WBC: 5.7 10*3/uL (ref 3.8–10.8)

## 2020-01-18 LAB — BASIC METABOLIC PANEL WITH GFR
BUN/Creatinine Ratio: 14 (calc) (ref 6–22)
BUN: 22 mg/dL (ref 7–25)
CO2: 25 mmol/L (ref 20–32)
Calcium: 10.3 mg/dL (ref 8.6–10.4)
Chloride: 106 mmol/L (ref 98–110)
Creat: 1.58 mg/dL — ABNORMAL HIGH (ref 0.60–0.93)
GFR, Est African American: 36 mL/min/{1.73_m2} — ABNORMAL LOW (ref >=60)
GFR, Est Non African American: 31 mL/min/{1.73_m2} — ABNORMAL LOW (ref >=60)
Glucose, Bld: 86 mg/dL (ref 65–139)
Potassium: 4.2 mmol/L (ref 3.5–5.3)
Sodium: 137 mmol/L (ref 135–146)

## 2020-01-18 LAB — TSH: TSH: 8.93 mIU/L — ABNORMAL HIGH (ref 0.40–4.50)

## 2020-01-19 ENCOUNTER — Telehealth: Payer: Self-pay | Admitting: Family Medicine

## 2020-01-19 ENCOUNTER — Other Ambulatory Visit: Payer: Self-pay

## 2020-01-19 ENCOUNTER — Encounter (HOSPITAL_COMMUNITY): Payer: Self-pay

## 2020-01-19 ENCOUNTER — Emergency Department (HOSPITAL_COMMUNITY)
Admission: EM | Admit: 2020-01-19 | Discharge: 2020-01-19 | Disposition: A | Discharge status: Home / Self Care | Payer: Medicare Other | Attending: Emergency Medicine | Admitting: Emergency Medicine

## 2020-01-19 DIAGNOSIS — R195 Other fecal abnormalities: Secondary | ICD-10-CM

## 2020-01-19 DIAGNOSIS — D509 Iron deficiency anemia, unspecified: Secondary | ICD-10-CM | POA: Diagnosis not present

## 2020-01-19 DIAGNOSIS — Z7982 Long term (current) use of aspirin: Secondary | ICD-10-CM | POA: Insufficient documentation

## 2020-01-19 DIAGNOSIS — Z951 Presence of aortocoronary bypass graft: Secondary | ICD-10-CM | POA: Diagnosis not present

## 2020-01-19 DIAGNOSIS — N183 Chronic kidney disease, stage 3 unspecified: Secondary | ICD-10-CM | POA: Insufficient documentation

## 2020-01-19 DIAGNOSIS — I10 Essential (primary) hypertension: Secondary | ICD-10-CM | POA: Diagnosis not present

## 2020-01-19 DIAGNOSIS — Z79899 Other long term (current) drug therapy: Secondary | ICD-10-CM | POA: Diagnosis not present

## 2020-01-19 DIAGNOSIS — K921 Melena: Secondary | ICD-10-CM | POA: Diagnosis not present

## 2020-01-19 DIAGNOSIS — I2510 Atherosclerotic heart disease of native coronary artery without angina pectoris: Secondary | ICD-10-CM | POA: Diagnosis not present

## 2020-01-19 DIAGNOSIS — D649 Anemia, unspecified: Secondary | ICD-10-CM | POA: Diagnosis present

## 2020-01-19 DIAGNOSIS — I129 Hypertensive chronic kidney disease with stage 1 through stage 4 chronic kidney disease, or unspecified chronic kidney disease: Secondary | ICD-10-CM | POA: Diagnosis not present

## 2020-01-19 DIAGNOSIS — Z87891 Personal history of nicotine dependence: Secondary | ICD-10-CM | POA: Insufficient documentation

## 2020-01-19 LAB — COMPREHENSIVE METABOLIC PANEL
ALT: 17 U/L (ref 0–44)
AST: 22 U/L (ref 15–41)
Albumin: 3.8 g/dL (ref 3.5–5.0)
Alkaline Phosphatase: 71 U/L (ref 38–126)
Anion gap: 11 (ref 5–15)
BUN: 22 mg/dL (ref 8–23)
CO2: 22 mmol/L (ref 22–32)
Calcium: 9.8 mg/dL (ref 8.9–10.3)
Chloride: 103 mmol/L (ref 98–111)
Creatinine, Ser: 1.68 mg/dL — ABNORMAL HIGH (ref 0.44–1.00)
GFR calc Af Amer: 34 mL/min — ABNORMAL LOW (ref >60)
GFR calc non Af Amer: 29 mL/min — ABNORMAL LOW (ref >60)
Glucose, Bld: 99 mg/dL (ref 70–99)
Potassium: 4.4 mmol/L (ref 3.5–5.1)
Sodium: 136 mmol/L (ref 135–145)
Total Bilirubin: 0.4 mg/dL (ref 0.3–1.2)
Total Protein: 7.4 g/dL (ref 6.5–8.1)

## 2020-01-19 LAB — CBC WITH DIFFERENTIAL/PLATELET
Abs Immature Granulocytes: 0 10*3/uL (ref 0.00–0.07)
Basophils Absolute: 0 10*3/uL (ref 0.0–0.1)
Basophils Relative: 1 %
Eosinophils Absolute: 0.3 10*3/uL (ref 0.0–0.5)
Eosinophils Relative: 4 %
HCT: 24.2 % — ABNORMAL LOW (ref 36.0–46.0)
Hemoglobin: 7.6 g/dL — ABNORMAL LOW (ref 12.0–15.0)
Immature Granulocytes: 0 %
Lymphocytes Relative: 17 %
Lymphs Abs: 1 10*3/uL (ref 0.7–4.0)
MCH: 29.6 pg (ref 26.0–34.0)
MCHC: 31.4 g/dL (ref 30.0–36.0)
MCV: 94.2 fL (ref 80.0–100.0)
Monocytes Absolute: 0.4 10*3/uL (ref 0.1–1.0)
Monocytes Relative: 6 %
Neutro Abs: 4.4 10*3/uL (ref 1.7–7.7)
Neutrophils Relative %: 72 %
Platelets: 245 10*3/uL (ref 150–400)
RBC: 2.57 MIL/uL — ABNORMAL LOW (ref 3.87–5.11)
RDW: 15.2 % (ref 11.5–15.5)
WBC: 6.1 10*3/uL (ref 4.0–10.5)
nRBC: 0 % (ref 0.0–0.2)

## 2020-01-19 LAB — IRON AND TIBC
Iron: 20 ug/dL — ABNORMAL LOW (ref 28–170)
Saturation Ratios: 5 % — ABNORMAL LOW (ref 10.4–31.8)
TIBC: 423 ug/dL (ref 250–450)
UIBC: 403 ug/dL

## 2020-01-19 LAB — URINALYSIS, ROUTINE W REFLEX MICROSCOPIC
Bacteria, UA: NONE SEEN
Bilirubin Urine: NEGATIVE
Glucose, UA: NEGATIVE mg/dL
Ketones, ur: NEGATIVE mg/dL
Leukocytes,Ua: NEGATIVE
Nitrite: NEGATIVE
Protein, ur: 30 mg/dL — AB
Specific Gravity, Urine: 1.004 — ABNORMAL LOW (ref 1.005–1.030)
pH: 7 (ref 5.0–8.0)

## 2020-01-19 LAB — RETICULOCYTES
Immature Retic Fract: 25.3 % — ABNORMAL HIGH (ref 2.3–15.9)
RBC.: 2.65 MIL/uL — ABNORMAL LOW (ref 3.87–5.11)
Retic Count, Absolute: 130.1 10*3/uL (ref 19.0–186.0)
Retic Ct Pct: 4.9 % — ABNORMAL HIGH (ref 0.4–3.1)

## 2020-01-19 LAB — FERRITIN: Ferritin: 14 ng/mL (ref 11–307)

## 2020-01-19 LAB — TYPE AND SCREEN
ABO/RH(D): A POS
Antibody Screen: NEGATIVE

## 2020-01-19 LAB — FOLATE: Folate: 59.4 ng/mL (ref >5.9)

## 2020-01-19 LAB — POC OCCULT BLOOD, ED: Fecal Occult Bld: POSITIVE — AB

## 2020-01-19 LAB — VITAMIN B12: Vitamin B-12: 1411 pg/mL — ABNORMAL HIGH (ref 180–914)

## 2020-01-19 MED ORDER — SODIUM CHLORIDE 0.9 % IV SOLN
510.0000 mg | Freq: Once | INTRAVENOUS | Status: AC
  Administered 2020-01-19: 510 mg via INTRAVENOUS
  Filled 2020-01-19: qty 510

## 2020-01-19 MED ORDER — METOPROLOL SUCCINATE ER 25 MG PO TB24
100.0000 mg | ORAL_TABLET | Freq: Every day | ORAL | Status: DC
  Administered 2020-01-19: 100 mg via ORAL
  Filled 2020-01-19: qty 4

## 2020-01-19 MED ORDER — AMLODIPINE BESYLATE 5 MG PO TABS
5.0000 mg | ORAL_TABLET | Freq: Once | ORAL | Status: AC
Start: 2020-01-19 — End: 2020-01-19
  Administered 2020-01-19: 5 mg via ORAL
  Filled 2020-01-19: qty 1

## 2020-01-19 MED ORDER — ROSUVASTATIN CALCIUM 10 MG PO TABS
10.0000 mg | ORAL_TABLET | Freq: Every day | ORAL | Status: DC
  Administered 2020-01-19: 10 mg via ORAL
  Filled 2020-01-19 (×2): qty 1

## 2020-01-19 NOTE — ED Provider Notes (Signed)
[facility] Provider Note   CSN: [id number] Arrival date & time: 01/19/20  5277     History Chief Complaint  Patient presents with  . Abnormal Lab    [name] [name] is a 79 y.o. female.  HPI      [name] [name] is a 79 y.o. female with past medical history of coronary artery disease, hypertension, hyperlipidemia, iron deficient anemia who presents to the Emergency Department under the advisement of her primary care provider for evaluation of low hemoglobin. Patient states that she had blood work done yesterday and was contacted this morning stating that her hemoglobin was low and she needed to be evaluated in the ER for possible blood transfusion. Patient reports previous transfusion due to anemia in 2019. She was scheduled to see her gastroenterologist next month for routine follow-up, but appointment has been pushed back to September. She states that she sometimes feels fatigued, but is able to continue to perform her ADLs without difficulty. She denies any symptoms at this time. She states that her stools have been dark but also admits to taking iron fortified vitamin daily.   Past Medical History:  Diagnosis Date  . 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 Carotid artery disease ([place])   . CKD (chronic kidney disease), stage III   . Essential hypertension   . Glaucoma    Laser surgery 2010  . Hyperlipidemia   . Hypothyroidism   . Iron deficiency anemia   . Obesity     Patient Active Problem List   Diagnosis Date Noted  . Gout 08/04/2018  . Hyperuricemia 08/04/2018  . Osteoarthritis of right knee 03/04/2018  . IDA (iron deficiency anemia) 03/04/2018  . CKD (chronic kidney disease) stage 3, GFR 30-59 ml/min 02/26/2018  . Carotid stenosis 02/01/2018  . 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 GERD (gastroesophageal reflux disease) 11/10/2013  . Bradycardia, sinus 08/05/2013  .  Metabolic syndrome X 82/42/3536  . CAD (coronary artery disease)   . Ejection fraction   . CKD (chronic kidney disease) 07/11/2011  . Hypothyroidism 01/31/2009  . CORONARY ARTERY BYPASS GRAFT, HX OF 01/31/2009  . Carotid artery disease ([place]) 11/30/2008  . Allergic rhinitis 08/12/2008  . Hyperlipidemia LDL goal <70 10/07/2007  . Morbid obesity ([place]) 10/07/2007  . Essential hypertension, malignant 10/07/2007    Past Surgical History:  Procedure Laterality Date  . COLONOSCOPY N/A 02/04/2013   Procedure: COLONOSCOPY;  Surgeon: Rogene Houston, MD;  [facility];  Service: Endoscopy;  Laterality: N/A;  1015  . COLONOSCOPY N/A 12/21/2016   Procedure: COLONOSCOPY;  Surgeon: Rogene Houston, MD;  [facility];  Service: Endoscopy;  Laterality: N/A;  . CORONARY ARTERY BYPASS GRAFT  October 2005   Dr. Roxy Manns - LIMA to LAD, SVG to OM 2 and OM 3, SVG to PDA  . ESOPHAGOGASTRODUODENOSCOPY N/A 12/21/2016   Procedure: ESOPHAGOGASTRODUODENOSCOPY (EGD);  Surgeon: Rogene Houston, MD;  [facility];  Service: Endoscopy;  Laterality: N/A;  11:10  . ESOPHAGOGASTRODUODENOSCOPY N/A 02/27/2018   Procedure: ESOPHAGOGASTRODUODENOSCOPY (EGD);  Surgeon: Rogene Houston, MD;  [facility];  Service: Endoscopy;  Laterality: N/A;  . EYE SURGERY  2010   laser treatment to both eyes for glaucoma  . GIVENS CAPSULE STUDY N/A 03/07/2018   Procedure: GIVENS CAPSULE STUDY;  Surgeon: Rogene Houston, MD;  [facility];  Service: Endoscopy;  Laterality: N/A;  OB History   No obstetric history on file.     Family History  Problem Relation Age of Onset  . Aneurysm Mother   . Hypertension Mother   . Coronary artery disease Mother   . Coronary artery disease Father   . Diabetes Son   . Pancreatic cancer Son   . Colon cancer Brother 77       lived 18 months afterwards  . Liver cancer Brother 59  . Diabetes Sister   . Coronary artery disease Sister   .  Diverticulosis Sister   . COPD Sister   . Kidney disease Sister   . Diabetes Sister   . Hypertension Sister   . Breast cancer Sister 65  . Hearing loss Sister   . Heart Problems Sister        pacemaker    Social History   Tobacco Use  . Smoking status: Former Smoker    Packs/day: 0.25    Years: 40.00    Pack years: 10.00    Types: Cigarettes    Quit date: 07/03/2003    Years since quitting: 16.5  . Smokeless tobacco: Never Used  Vaping Use  . Vaping Use: Never used  Substance Use Topics  . Alcohol use: No    Alcohol/week: 0.0 standard drinks  . Drug use: No    Home Medications Prior to Admission medications   Medication Sig Start Date End Date Taking? Authorizing Provider  acetaminophen (TYLENOL) 500 MG tablet Take 1,000 mg by mouth every 6 (six) hours as needed for mild pain or headache.    Yes [provider]  amLODipine (NORVASC) 5 MG tablet TAKE ONE TABLET BY MOUTH DAILY. Patient taking differently: Take 5 mg by mouth daily.  09/09/19  Yes Fayrene Helper, MD  aspirin 81 MG tablet Take 1 tablet (81 mg total) by mouth daily. Restart on 03/02/18 03/02/18  Yes Tat, Shanon Brow, MD  cetirizine (ZYRTEC) 10 MG tablet TAKE ONE TABLET BY MOUTH DAILY. 10/06/19  Yes Fayrene Helper, MD  Cholecalciferol (VITAMIN D3) 1000 UNITS CAPS Take 1 capsule by mouth daily.   Yes [provider]  fish oil-omega-3 fatty acids 1000 MG capsule Take 1 g by mouth daily.    Yes [provider]  fluticasone (FLONASE) 50 MCG/ACT nasal spray USE 2 SPRAYS IN EACH NOSTRIL ONCE DAILY.  SHAKE GENTLY BEFORE USING. Patient taking differently: Place 2 sprays into both nostrils daily.  11/07/16  Yes Fayrene Helper, MD  hydrALAZINE (APRESOLINE) 25 MG tablet TAKE ONE TABLET BY MOUTH THREE TIMES DAILY Patient taking differently: Take 25 mg by mouth 3 (three) times daily.  09/01/19  Yes Fayrene Helper, MD  hydrALAZINE (APRESOLINE) 50 MG tablet Take 1 tablet (50 mg total) by mouth 3  (three) times daily. 04/07/19  Yes Fayrene Helper, MD  latanoprost (XALATAN) 0.005 % ophthalmic solution Place 1 drop into both eyes at bedtime. One drop each eye at bedtime    Yes [provider]  levothyroxine (SYNTHROID) 75 MCG tablet TAKE ONE AND ONE-HALF TABLETS BY MOUTH EVERY MONDAY, [place]. TAKE ONE TABLET BY MOUTH DAILY ON TUESDAY, THURSDAY, SATURDAY AND SUNDAY. Patient taking differently: Take 75 mcg by mouth See admin instructions. TAKE ONE AND ONE-HALF TABLETS BY MOUTH EVERY MONDAY, [place]. TAKE ONE TABLET BY MOUTH DAILY ON TUESDAY, THURSDAY, SATURDAY AND SUNDAY. 03/07/19  Yes Perlie Mayo, NP  metoprolol succinate (TOPROL-XL) 100 MG 24 hr tablet TAKE ONE TABLET BY MOUTH DAILY.  TAKE WITH OR IMMEDIATELY FOLLOWING A MEAL. Patient taking differently: Take 100 mg by mouth daily. TAKE ONE TABLET BY MOUTH DAILY. TAKE WITH OR IMMEDIATELY FOLLOWING A MEAL. 12/28/19  Yes Fayrene Helper, MD  Multiple Vitamins-Minerals [facility] COMPLETE 50 PLUS PO) Take 1 tablet by mouth daily.    Yes [provider]  omeprazole (PRILOSEC) 40 MG capsule TAKE ONE CAPSULE BY MOUTH DAILY. 08/03/19  Yes Fayrene Helper, MD  Polyethyl Glycol-Propyl Glycol (SYSTANE ULTRA OP) Apply 1 drop to eye as needed (dry eyes).   Yes [provider]  rosuvastatin (CRESTOR) 10 MG tablet TAKE ONE TABLET BY MOUTH DAILY. Patient taking differently: Take 10 mg by mouth daily.  12/01/19  Yes Fayrene Helper, MD  Misc. Devices (WALL GRAB BAR) MISC Grab bar for shower x 2 DX high fall risk Patient not taking: Reported on 01/19/2020 11/08/17   Fayrene Helper, MD  nitroGLYCERIN (NITROSTAT) 0.4 MG SL tablet DISSOLVE ONE TABLET UNDER TONGUE EVERY 5 MINUTES UP TO 3 DOSES AS NEEDED FOR CHEST PAIN. 10/29/19   Satira Sark, MD  Pediatric Multivitamins-Iron [facility] COMPLETE PO) Take 2 tablets by mouth daily. With iron Patient not taking: Reported on 01/19/2020     [provider]    Allergies    Tramadol and Clonidine derivatives  Review of Systems   Review of Systems  Constitutional: Negative for chills, fatigue and fever.  HENT: Negative for sore throat and trouble swallowing.   Respiratory: Negative for shortness of breath and wheezing.   Cardiovascular: Negative for chest pain, palpitations and leg swelling.  Gastrointestinal: Negative for abdominal distention, abdominal pain, blood in stool, diarrhea, nausea, rectal pain and vomiting.  Genitourinary: Negative for dysuria, flank pain and hematuria.  Musculoskeletal: Negative for arthralgias, back pain, myalgias, neck pain and neck stiffness.  Skin: Negative for rash.  Neurological: Negative for dizziness, syncope, weakness, numbness and headaches.  Hematological: Does not bruise/bleed easily.    Physical Exam Updated Vital Signs BP (!) 180/58 (BP Location: Right Arm)   Pulse (!) 50   Temp 98.1 F (36.7 C) (Oral)   Resp 13   Ht 5\' 1"  (1.549 m)   Wt 80.7 kg   SpO2 98%   BMI 33.63 kg/m   Physical Exam Constitutional:      Appearance: Normal appearance. She is not ill-appearing or toxic-appearing.  HENT:     Mouth/Throat:     Mouth: Mucous membranes are moist.  Cardiovascular:     Rate and Rhythm: Normal rate and regular rhythm.     Pulses: Normal pulses.  Pulmonary:     Effort: Pulmonary effort is normal.     Breath sounds: Normal breath sounds.  Abdominal:     General: There is no distension.     Palpations: Abdomen is soft.     Tenderness: There is no abdominal tenderness.  Genitourinary:    Rectum: Guaiac result positive. No mass, tenderness or external hemorrhoid.     Comments: Dark brown, heme positive stool on rectal exam. No palpable rectal masses. Musculoskeletal:        General: Normal range of motion.     Right lower leg: No edema.     Left lower leg: No edema.  Skin:    General: Skin is warm.     Capillary Refill: Capillary refill takes less than 2  seconds.     Findings: No rash.  Neurological:     General: No focal deficit present.     Mental  Status: She is alert.     Sensory: Sensation is intact.     Motor: Motor function is intact.     Coordination: Coordination is intact.     Comments: CN II through XII grossly intact. Speech clear.     ED Results / Procedures / Treatments   Labs (all labs ordered are listed, but only abnormal results are displayed) Labs Reviewed  COMPREHENSIVE METABOLIC PANEL - Abnormal; Notable for the following components:      Result Value   Creatinine, Ser 1.68 (*)    GFR calc non Af Amer 29 (*)    GFR calc Af Amer 34 (*)    All other components within normal limits  CBC WITH DIFFERENTIAL/PLATELET - Abnormal; Notable for the following components:   RBC 2.57 (*)    Hemoglobin 7.6 (*)    HCT 24.2 (*)    All other components within normal limits  URINALYSIS, ROUTINE W REFLEX MICROSCOPIC - Abnormal; Notable for the following components:   Color, Urine STRAW (*)    Specific Gravity, Urine 1.004 (*)    Hgb urine dipstick SMALL (*)    Protein, ur 30 (*)    All other components within normal limits  RETICULOCYTES - Abnormal; Notable for the following components:   Retic Ct Pct 4.9 (*)    RBC. 2.65 (*)    Immature Retic Fract 25.3 (*)    All other components within normal limits  POC OCCULT BLOOD, ED - Abnormal; Notable for the following components:   Fecal Occult Bld POSITIVE (*)    All other components within normal limits  VITAMIN B12  FOLATE  IRON AND TIBC  FERRITIN  TYPE AND SCREEN    EKG EKG Interpretation  Date/Time:  Tuesday January 19 2020 09:58:06 EDT Ventricular Rate:  48 PR Interval:    QRS Duration: 104 QT Interval:  473 QTC Calculation: 423 R Axis:   69 Text Interpretation: Sinus or ectopic atrial bradycardia Probable LVH with secondary repol abnrm Anterior Q waves, possibly due to LVH Confirmed by Milton Ferguson [phone]) on 01/19/2020 10:41:02 AM   Radiology No results  found.  Procedures Procedures (including critical care time)  Medications Ordered in ED Medications - No data to display  ED Course  I have reviewed the triage vital signs and the nursing notes.  Pertinent labs & imaging results that were available during my care of the patient were reviewed by me and considered in my medical decision making (see chart for details).    MDM Rules/Calculators/A&P                          Patient sent here by PCPs office for evaluation of low hemoglobin.  Hemoglobin yesterday was 7.5, 7.6 today.  On my exam, patient is well-appearing.  Vital signs reviewed.  Abdomen is soft and nontender, on rectal exam she has dark brown heme positive stool.  No palpable rectal masses.   Patient ambulated in the department and gait was steady, no focal neuro deficits.  She is well-appearing and asymptomatic.  I do not feel that blood transfusion is necessary given patient's lack of symptoms.  Her orthostatics also reassuring.  I will consult with her gastroenterologist.  Of note, patient had iron transfusion in March of this year   Spoke with Dr. Laural Golden who recommends Iron studies and iron transfusion. He will see pt in his office for close f/u    On recheck after iron infusion, patient  is resting comfortably.  Continues to remain asymptomatic.  She has tolerated oral fluids well.  I feel that she is appropriate for discharge home, will have her continue her multivitamin with iron and she will follow-up closely with Dr. Ewell Poe office.  Strict return precautions were also discussed.    Final Clinical Impression(s) / ED Diagnoses Final diagnoses:  Iron deficiency anemia, unspecified iron deficiency anemia type  Heme positive stool    Rx / DC Orders ED Discharge Orders    None       Bufford Lope 01/19/20 1914    Milton Ferguson, MD 01/20/20 [phone]

## 2020-01-19 NOTE — ED Notes (Signed)
PT ambulated well with cane.

## 2020-01-19 NOTE — Telephone Encounter (Signed)
Pt has low HB, needs ED eval , pls let her know blood count has fallen and I recommend she go to the ED for further evaluation

## 2020-01-19 NOTE — Telephone Encounter (Signed)
Spoke with patient and advised her of results and providers recommendation with verbal understanding.

## 2020-01-19 NOTE — ED Triage Notes (Signed)
Pt had lab work done at Dr Moshe Cipro yesterday and was called to come to ED due to low hemoglobin. Pt reports she feels tired

## 2020-01-19 NOTE — Discharge Instructions (Addendum)
You were given an infusion of iron today.  Continue taking your multivitamin with iron as directed.  I have spoken with Dr. Dereck Leep and someone from his office should contact you to arrange a follow-up visit with him.  Return to the emergency department for any worsening symptoms.

## 2020-01-25 ENCOUNTER — Other Ambulatory Visit: Payer: Self-pay

## 2020-01-25 ENCOUNTER — Encounter: Payer: Self-pay | Admitting: Family Medicine

## 2020-01-25 ENCOUNTER — Ambulatory Visit (INDEPENDENT_AMBULATORY_CARE_PROVIDER_SITE_OTHER): Payer: Medicare Other | Admitting: Family Medicine

## 2020-01-25 VITALS — BP 140/72 | HR 51 | Resp 16 | Ht 61.0 in | Wt 174.1 lb

## 2020-01-25 DIAGNOSIS — M1A9XX1 Chronic gout, unspecified, with tophus (tophi): Secondary | ICD-10-CM

## 2020-01-25 DIAGNOSIS — I10 Essential (primary) hypertension: Secondary | ICD-10-CM | POA: Diagnosis not present

## 2020-01-25 DIAGNOSIS — R195 Other fecal abnormalities: Secondary | ICD-10-CM | POA: Diagnosis not present

## 2020-01-25 DIAGNOSIS — E038 Other specified hypothyroidism: Secondary | ICD-10-CM

## 2020-01-25 DIAGNOSIS — K5790 Diverticulosis of intestine, part unspecified, without perforation or abscess without bleeding: Secondary | ICD-10-CM

## 2020-01-25 DIAGNOSIS — Z1159 Encounter for screening for other viral diseases: Secondary | ICD-10-CM | POA: Diagnosis not present

## 2020-01-25 DIAGNOSIS — D500 Iron deficiency anemia secondary to blood loss (chronic): Secondary | ICD-10-CM | POA: Diagnosis not present

## 2020-01-25 DIAGNOSIS — E785 Hyperlipidemia, unspecified: Secondary | ICD-10-CM

## 2020-01-25 MED ORDER — LEVOTHYROXINE SODIUM 100 MCG PO TABS: 100.0000 ug | ORAL_TABLET | Freq: Every day | ORAL | 3 refills | Status: DC

## 2020-01-25 NOTE — Patient Instructions (Addendum)
Annual physical exam in office with MD in 3  months, call if you need me sooner  You are referred to Dr Laural Golden for sooner follow up as able  Dr Marylene Buerger will address your iron supplements.   Fasting lipid, cmp and EGFR, tSH , cBC , iron and ferritin and hepatitis screen,  3 to o 5 days before next visit  Increase, new , dose of levothyroxine 100 mcg one daily  It is important that you exercise regularly at least 30 minutes 5 times a week. If you develop chest pain, have severe difficulty breathing, or feel very tired, stop exercising immediately and seek medical attention  Think about what you will eat, plan ahead. Choose " clean, green, fresh or frozen" over canned, processed or packaged foods which are more sugary, salty and fatty. 70 to 75% of food eaten should be vegetables and fruit. Three meals at set times with snacks allowed between meals, but they must be fruit or vegetables. Aim to eat over a 12 hour period , example 7 am to 7 pm, and STOP after  your last meal of the day. Drink water,generally about 64 ounces per day, no other drink is as healthy. Fruit juice is best enjoyed in a healthy way, by EATING the fruit. Thanks for choosing [facility], we consider it a privelige to serve you. Careful not to fall!

## 2020-01-27 DIAGNOSIS — N1832 Chronic kidney disease, stage 3b: Secondary | ICD-10-CM | POA: Diagnosis not present

## 2020-01-27 DIAGNOSIS — D508 Other iron deficiency anemias: Secondary | ICD-10-CM | POA: Diagnosis not present

## 2020-01-27 DIAGNOSIS — R809 Proteinuria, unspecified: Secondary | ICD-10-CM | POA: Diagnosis not present

## 2020-01-27 DIAGNOSIS — I129 Hypertensive chronic kidney disease with stage 1 through stage 4 chronic kidney disease, or unspecified chronic kidney disease: Secondary | ICD-10-CM | POA: Diagnosis not present

## 2020-01-27 DIAGNOSIS — I5032 Chronic diastolic (congestive) heart failure: Secondary | ICD-10-CM | POA: Diagnosis not present

## 2020-01-29 ENCOUNTER — Encounter: Payer: Self-pay | Admitting: Family Medicine

## 2020-01-29 NOTE — Progress Notes (Signed)
[name] [name]     MRN: [id number]      DOB: 1940/09/18   HPI Ms. [name] [name] is here for follow up and re-evaluation of chronic medical conditions, medication management and review of any available recent lab and radiology data.  Preventive health is updated, specifically  Cancer screening and Immunization.   Questions or concerns regarding consultations or procedures which the PT has had in the interim are  addressed. The PT denies any adverse reactions to current medications since the last visit.  There are no new concerns.  There are no specific complaints   ROS Denies recent fever or chills. Denies sinus pressure, nasal congestion, ear pain or sore throat. Denies chest congestion, productive cough or wheezing. Denies chest pains, palpitations and leg swelling Denies abdominal pain, nausea, vomiting,diarrhea or constipation.   Denies dysuria, frequency, hesitancy or incontinence. Denies uncontrolled  joint pain, swelling and limitation in mobility. Denies headaches, seizures, numbness, or tingling. Denies depression, anxiety or insomnia. Denies skin break down or rash.   PE  BP (!) 140/72   Pulse 51   Resp 16   Ht 5\' 1"  (1.549 m)   Wt 174 lb 1.3 oz (79 kg)   SpO2 96%   BMI 32.89 kg/m   Patient alert and oriented and in no cardiopulmonary distress.  HEENT: No facial asymmetry, EOMI,     Neck supple .  Chest: Clear to auscultation bilaterally.  CVS: S1, S2 no murmurs, no S3.Regular rate.  ABD: Soft non tender.   Ext: No edema  MS: Adequate though reduced  ROM spine, shoulders, hips and knees.  Skin: Intact, no ulcerations or rash noted.  Psych: Good eye contact, normal affect. Memory intact not anxious or depressed appearing.  CNS: CN 2-12 intact, power,  normal throughout.no focal deficits noted.   Assessment & Plan  Hypothyroidism Under corrected inc synthroid to 100 mcg daily  Essential hypertension, malignant Controlled, no change in medication DASH  diet and commitment to daily physical activity for a minimum of 30 minutes discussed and encouraged, as a part of hypertension management. The importance of attaining a healthy weight is also discussed.  BP/Weight 01/25/2020 01/19/2020 11/16/2019 08/31/2019 08/24/2019 07/28/2019 54/27/0623  Systolic BP 762 831 517 616 073 710 626  Diastolic BP 72 54 63 54 59 64 70  Wt. (Lbs) 174.08 178 188 - 188 187 186  BMI 32.89 33.63 35.52 - 35.52 35.33 35.14       Morbid obesity  Patient re-educated about  the importance of commitment to a  minimum of 150 minutes of exercise per week as able.  The importance of healthy food choices with portion control discussed, as well as eating regularly and within a 12 hour window most days. The need to choose "clean , green" food 50 to 75% of the time is discussed, as well as to make water the primary drink and set a goal of 64 ounces water daily.    Weight /BMI 01/25/2020 01/19/2020 11/16/2019  WEIGHT 174 lb 1.3 oz 178 lb 188 lb  HEIGHT 5\' 1"  5\' 1"  5\' 1"   BMI 32.89 kg/m2 33.63 kg/m2 35.52 kg/m2      Hyperlipidemia LDL goal <70 Hyperlipidemia:Low fat diet discussed and encouraged.   Lipid Panel  Lab Results  Component Value Date   CHOL 133 07/21/2019   HDL 52 07/21/2019   LDLCALC 61 07/21/2019   TRIG 118 07/21/2019   CHOLHDL 2.6 07/21/2019   Controlled, no change in medication Updated lab needed at/  before next visit.     IDA (iron deficiency anemia) Heme positive stool and IDA refer GI  Gout No recent flare

## 2020-01-29 NOTE — Assessment & Plan Note (Signed)
Heme positive stool and IDA refer GI

## 2020-01-29 NOTE — Assessment & Plan Note (Signed)
Controlled, no change in medication DASH diet and commitment to daily physical activity for a minimum of 30 minutes discussed and encouraged, as a part of hypertension management. The importance of attaining a healthy weight is also discussed.  BP/Weight 01/25/2020 01/19/2020 11/16/2019 08/31/2019 08/24/2019 07/28/2019 70/48/8891  Systolic BP 694 503 888 280 034 917 915  Diastolic BP 72 54 63 54 59 64 70  Wt. (Lbs) 174.08 178 188 - 188 187 186  BMI 32.89 33.63 35.52 - 35.52 35.33 35.14

## 2020-01-29 NOTE — Assessment & Plan Note (Signed)
No recent flare.  

## 2020-01-29 NOTE — Assessment & Plan Note (Signed)
  Patient re-educated about  the importance of commitment to a  minimum of 150 minutes of exercise per week as able.  The importance of healthy food choices with portion control discussed, as well as eating regularly and within a 12 hour window most days. The need to choose "clean , green" food 50 to 75% of the time is discussed, as well as to make water the primary drink and set a goal of 64 ounces water daily.    Weight /BMI 01/25/2020 01/19/2020 11/16/2019  WEIGHT 174 lb 1.3 oz 178 lb 188 lb  HEIGHT 5\' 1"  5\' 1"  5\' 1"   BMI 32.89 kg/m2 33.63 kg/m2 35.52 kg/m2

## 2020-01-29 NOTE — Assessment & Plan Note (Signed)
Under corrected inc synthroid to 100 mcg daily

## 2020-01-29 NOTE — Assessment & Plan Note (Signed)
Hyperlipidemia:Low fat diet discussed and encouraged.   Lipid Panel  Lab Results  Component Value Date   CHOL 133 07/21/2019   HDL 52 07/21/2019   LDLCALC 61 07/21/2019   TRIG 118 07/21/2019   CHOLHDL 2.6 07/21/2019   Controlled, no change in medication Updated lab needed at/ before next visit.

## 2020-02-01 ENCOUNTER — Other Ambulatory Visit: Payer: Self-pay | Admitting: Family Medicine

## 2020-02-01 ENCOUNTER — Encounter (INDEPENDENT_AMBULATORY_CARE_PROVIDER_SITE_OTHER): Payer: Self-pay | Admitting: Internal Medicine

## 2020-02-01 ENCOUNTER — Other Ambulatory Visit: Payer: Self-pay

## 2020-02-01 ENCOUNTER — Ambulatory Visit (INDEPENDENT_AMBULATORY_CARE_PROVIDER_SITE_OTHER): Payer: Medicare Other | Admitting: Internal Medicine

## 2020-02-01 VITALS — BP 194/69 | HR 53 | Temp 97.9°F | Ht 61.0 in | Wt 172.5 lb

## 2020-02-01 DIAGNOSIS — K219 Gastro-esophageal reflux disease without esophagitis: Secondary | ICD-10-CM

## 2020-02-01 DIAGNOSIS — D500 Iron deficiency anemia secondary to blood loss (chronic): Secondary | ICD-10-CM

## 2020-02-01 MED ORDER — OMEPRAZOLE 20 MG PO CPDR: 20.0000 mg | DELAYED_RELEASE_CAPSULE | Freq: Every day | ORAL | 3 refills | Status: DC

## 2020-02-01 MED ORDER — FERROUS SULFATE 325 (65 FE) MG PO TABS: 325.0000 mg | ORAL_TABLET | Freq: Two times a day (BID) | ORAL | 3 refills | Status: DC

## 2020-02-01 NOTE — Patient Instructions (Signed)
Decrease omeprazole to 20 mg daily.  Take it 30 minutes before breakfast. Please note iron prescription changed to ferrous sulfate but do not start taking iron until small bowel given capsule study completed.

## 2020-02-01 NOTE — H&P (View-Only) (Signed)
Presenting complaint;  Iron deficiency anemia.  History of present illness.  Patient is well-known to this practice with a prior evaluation for iron deficiency anemia which is summarized below. She is now being referred by Dr. Theador Hawthorne and Dr. Moshe Cipro for reevaluation of progressive anemia felt to be due to chronic GI blood loss. Patient was noted to have drop in her hemoglobin over the last few months.  She was noted to have hemoglobin of 7.5 g by Dr. Moshe Cipro on 01/18/2020.   Hemoglobin was 9.4 on 03/24/2019 and 9.5 on 10/15/2019. She was therefore referred to emergency room and was seen on 01/19/2020.  Her H&H was 7.6 and 24.2.  MCV was 94.2.  Her stool was noted to be guaiac positive.  Patient was evaluated by Ms. Tammy Triplett, PA-C and Dr. Milton Ferguson.  Patient was deemed to be stable and was not given blood transfusion.  I was contacted by Ms. Triplett and recommended iron studies B12 and folate levels.  Serum iron was 20 with TIBC of 423 and saturation was 5%, serum ferritin was 14.  B12 and folate levels were normal(1411 and 59.4 respectively).    Patient denies nausea vomiting abdominal pain melena or rectal bleeding.  She also denies lightheadedness or postural symptoms.  She just feels tired.  She has not noted significant change in her tiredness or fatigue over the last few weeks.  Her bowels move daily.  She usually has formed stools.  She says her appetite is very good and she has not lost any weight.  She also denies dysphagia she does complain of cough which started when she began to take lisinopril recommended by Dr. Theador Hawthorne to help improve kidney disease.  Denies chest pain or exertional dyspnea.  Patient is on low-dose aspirin but she does not take other OTC NSAIDs.  She says she has been taking Flintstone chewable with iron 1 or 2 tablets daily since this was recommended by me back in 2019.  Prior GI work-up for iron deficiency anemia is as follows  EGD and colonoscopy on  12/21/2016. EGD revealed small sliding hiatal hernia mild gastritis without evidence of H. 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  These were ablated with APC. Colonoscopy revealed 2 small tubular adenomas.  She underwent repeat EGD on 02/27/2018 for persistent anemia. She had small sliding hiatal hernia but no other abnormalities noted in the stomach or duodenum.  He had small bowel given capsule study on 03/07/2018.  The study was incomplete as capsule never reached the cecum.  However no bleeding lesions are identified in the stomach or small bowel.  Current Medications: Outpatient Encounter Medications as of 02/01/2020  Medication Sig  . acetaminophen (TYLENOL) 500 MG tablet Take 1,000 mg by mouth every 6 (six) hours as needed for mild pain or headache.   Marland Kitchen amLODipine (NORVASC) 5 MG tablet TAKE ONE TABLET BY MOUTH DAILY. (Patient taking differently: Take 5 mg by mouth daily. )  . aspirin 81 MG tablet Take 1 tablet (81 mg total) by mouth daily. Restart on 03/02/18  . cetirizine (ZYRTEC) 10 MG tablet TAKE ONE TABLET BY MOUTH DAILY.  Marland Kitchen Cholecalciferol (VITAMIN D3) 1000 UNITS CAPS Take 1 capsule by mouth daily.  . fish oil-omega-3 fatty acids 1000 MG capsule Take 1,200 mg by mouth daily.   . fluticasone (FLONASE) 50 MCG/ACT nasal spray USE 2 SPRAYS IN EACH NOSTRIL ONCE DAILY.  SHAKE GENTLY BEFORE USING. (Patient taking differently: Place 2  sprays into both nostrils daily. )  . hydrALAZINE (APRESOLINE) 25 MG tablet TAKE ONE TABLET BY MOUTH THREE TIMES DAILY (Patient taking differently: Take 25 mg by mouth 3 (three) times daily. )  . hydrALAZINE (APRESOLINE) 50 MG tablet Take 1 tablet (50 mg total) by mouth 3 (three) times daily.  Marland Kitchen latanoprost (XALATAN) 0.005 % ophthalmic solution Place 1 drop into both eyes at bedtime. One drop each eye at bedtime   . levothyroxine (SYNTHROID) 100 MCG tablet Take 1 tablet (100 mcg total) by mouth daily.  Marland Kitchen  lisinopril (ZESTRIL) 2.5 MG tablet Take 2.5 mg by mouth. Patient states that she takes at 1 pm daily  . metoprolol succinate (TOPROL-XL) 100 MG 24 hr tablet TAKE ONE TABLET BY MOUTH DAILY. TAKE WITH OR IMMEDIATELY FOLLOWING A MEAL. (Patient taking differently: Take 100 mg by mouth daily. TAKE ONE TABLET BY MOUTH DAILY. TAKE WITH OR IMMEDIATELY FOLLOWING A MEAL.)  . Misc. Devices (WALL GRAB BAR) MISC Grab bar for shower x 2 DX high fall risk  . nitroGLYCERIN (NITROSTAT) 0.4 MG SL tablet DISSOLVE ONE TABLET UNDER TONGUE EVERY 5 MINUTES UP TO 3 DOSES AS NEEDED FOR CHEST PAIN.  Marland Kitchen omeprazole (PRILOSEC) 40 MG capsule TAKE ONE CAPSULE BY MOUTH DAILY.  Marland Kitchen Pediatric Multivitamins-Iron (FLINTSTONES COMPLETE PO) Take 2 tablets by mouth daily. With iron   . Pediatric Multivitamins-Iron Vicki Mallet W/IRON PO) Take by mouth in the morning and at bedtime.  . rosuvastatin (CRESTOR) 10 MG tablet TAKE ONE TABLET BY MOUTH DAILY. (Patient taking differently: Take 10 mg by mouth daily. )  . [DISCONTINUED] Multiple Vitamins-Minerals (THERATRUM COMPLETE 50 PLUS PO) Take 1 tablet by mouth daily.  (Patient not taking: Reported on 02/01/2020)   No facility-administered encounter medications on file as of 02/01/2020.   Past Medical History:  Diagnosis Date  . 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 Carotid artery disease ([place])   . CKD (chronic kidney disease), stage III   . Essential hypertension   . Glaucoma    Laser surgery 2010  . Hyperlipidemia   . Hypothyroidism   . Iron deficiency anemia   . Obesity    Past Surgical History:  Procedure Laterality Date  . COLONOSCOPY N/A 02/04/2013   Procedure: COLONOSCOPY;  Surgeon: Rogene Houston, MD;  Location: [facility];  Service: Endoscopy;  Laterality: N/A;  1015  . COLONOSCOPY N/A 12/21/2016   Procedure: COLONOSCOPY;  Surgeon: Rogene Houston, MD;  Location: [facility];  Service: Endoscopy;  Laterality: N/A;  . CORONARY  ARTERY BYPASS GRAFT  October 2005   Dr. Roxy Manns - LIMA to LAD, SVG to OM 2 and OM 3, SVG to PDA  . ESOPHAGOGASTRODUODENOSCOPY N/A 12/21/2016   Procedure: ESOPHAGOGASTRODUODENOSCOPY (EGD);  Surgeon: Rogene Houston, MD;  Location: [facility];  Service: Endoscopy;  Laterality: N/A;  11:10  . ESOPHAGOGASTRODUODENOSCOPY N/A 02/27/2018   Procedure: ESOPHAGOGASTRODUODENOSCOPY (EGD);  Surgeon: Rogene Houston, MD;  Location: [facility];  Service: Endoscopy;  Laterality: N/A;  . EYE SURGERY  2010   laser treatment to both eyes for glaucoma  . GIVENS CAPSULE STUDY N/A 03/07/2018   Procedure: GIVENS CAPSULE STUDY;  Surgeon: Rogene Houston, MD;  Location: [facility];  Service: Endoscopy;  Laterality: N/A;   Allergies Allergies  Allergen Reactions  . Tramadol Itching  . Clonidine Derivatives Other (See Comments)    Zombie BP dropped      Objective: Blood pressure Marland Kitchen)  194/69, pulse (!) 53, temperature 97.9 F (36.6 C), temperature source Oral, height '5\' 1"'  (1.549 m), weight 172 lb 8 oz (78.2 kg). Patient is alert and in no acute distress. She uses cane to ambulate. She is wearing a mask. Conjunctiva is pale. Sclera is nonicteric Oropharyngeal mucosa is normal. No neck masses or thyromegaly noted. She has midsternal scar. Cardiac exam with regular rhythm normal S1 and S2.  She has grade 2/6 systolic murmur noted best at upper left sternal border. Lungs are clear to auscultation. Abdomen is full.  Bowel sounds are normal.  On palpation is soft and nontender with organomegaly or masses. No LE edema, clubbing or koilonychia noted.  Labs/studies Results:  CBC Latest Ref Rng & Units 01/19/2020 01/18/2020 03/31/2019  WBC 4.0 - 10.5 K/uL 6.1 5.7 7.7  Hemoglobin 12.0 - 15.0 g/dL 7.6(L) 7.5(L) 9.4(L)  Hematocrit 36 - 46 % 24.2(L) 23.5(L) 29.5(L)  Platelets 150 - 400 K/uL 245 245 239    CMP Latest Ref Rng & Units 01/19/2020 01/18/2020 07/21/2019  Glucose 70 - 99 mg/dL 99 86 94  BUN 8 - 23  mg/dL '22 22 17  ' Creatinine 0.44 - 1.00 mg/dL 1.68(H) 1.58(H) 1.66(H)  Sodium 135 - 145 mmol/L 136 137 141  Potassium 3.5 - 5.1 mmol/L 4.4 4.2 4.4  Chloride 98 - 111 mmol/L 103 106 107  CO2 22 - 32 mmol/L '22 25 26  ' Calcium 8.9 - 10.3 mg/dL 9.8 10.3 10.4  Total Protein 6.5 - 8.1 g/dL 7.4 - 7.4  Total Bilirubin 0.3 - 1.2 mg/dL 0.4 - 0.4  Alkaline Phos 38 - 126 U/L 71 - -  AST 15 - 41 U/L 22 - 20  ALT 0 - 44 U/L 17 - 12    Hepatic Function Latest Ref Rng & Units 01/19/2020 07/21/2019 03/31/2019  Total Protein 6.5 - 8.1 g/dL 7.4 7.4 7.7  Albumin 3.5 - 5.0 g/dL 3.8 - -  AST 15 - 41 U/L '22 20 20  ' ALT 0 - 44 U/L '17 12 12  ' Alk Phosphatase 38 - 126 U/L 71 - -  Total Bilirubin 0.3 - 1.2 mg/dL 0.4 0.4 0.4  Bilirubin, Direct 0.0 - 0.3 mg/dL - - -    Iron studies B12 and folate levels as above.   Assessment:  #1.  Patient is 79 year old female with multiple medical problems including chronic kidney disease coronary artery disease and hypertension who has history of iron deficiency anemia dating back to 3 years ago.  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  Colonoscopy revealed 2 small tubular adenomas but no other bleeding lesions.  EGD in August 2019 did not reveal any bleeding lesions followed by small bowel given capsule study was also negative but incomplete as capsule did not reach cecum. It is worth noting that patient's hemoglobin back in May 2020 was 9.3 when serum iron was normal at 42 and ferritin was low normal at 23.  Therefore chronic disease may also be contributing to her anemia. Since she is losing blood from her GI tract reevaluation would be appropriate  #2.  Cough possibly triggered by lisinopril.  If cough gets worse she will call Dr. Toya Smothers office for further recommendations.  #3.  Chronic GERD.  She is asymptomatic on 40 mg of omeprazole.  Therefore dose could be reduced to 20  mg daily  Plan:  Discontinue Flintstone chewable with iron. Small bowel given capsule study as  soon as possible. Will check H&H at the time of small bowel given capsule study which hopefully would be later this week. Begin ferrous sulfate at a dose of 325 mg p.o. twice daily when small bowel study completed. Hemoglobin remains low will consider iron infusion. Decrease omeprazole to 20 mg by mouth 30 minutes before breakfast daily. Office visit in 3 months.

## 2020-02-01 NOTE — Progress Notes (Signed)
Presenting complaint;  Iron deficiency anemia.  History of present illness.  Patient is well-known to this practice with a prior evaluation for iron deficiency anemia which is summarized below. She is now being referred by Dr. Theador Hawthorne and Dr. Moshe Cipro for reevaluation of progressive anemia felt to be due to chronic GI blood loss. Patient was noted to have drop in her hemoglobin over the last few months.  She was noted to have hemoglobin of 7.5 g by Dr. Moshe Cipro on 01/18/2020.   Hemoglobin was 9.4 on 03/24/2019 and 9.5 on 10/15/2019. She was therefore referred to emergency room and was seen on 01/19/2020.  Her H&H was 7.6 and 24.2.  MCV was 94.2.  Her stool was noted to be guaiac positive.  Patient was evaluated by Ms. Tammy Triplett, PA-C and Dr. Milton Ferguson.  Patient was deemed to be stable and was not given blood transfusion.  I was contacted by Ms. Triplett and recommended iron studies B12 and folate levels.  Serum iron was 20 with TIBC of 423 and saturation was 5%, serum ferritin was 14.  B12 and folate levels were normal(1411 and 59.4 respectively).    Patient denies nausea vomiting abdominal pain melena or rectal bleeding.  She also denies lightheadedness or postural symptoms.  She just feels tired.  She has not noted significant change in her tiredness or fatigue over the last few weeks.  Her bowels move daily.  She usually has formed stools.  She says her appetite is very good and she has not lost any weight.  She also denies dysphagia she does complain of cough which started when she began to take lisinopril recommended by Dr. Theador Hawthorne to help improve kidney disease.  Denies chest pain or exertional dyspnea.  Patient is on low-dose aspirin but she does not take other OTC NSAIDs.  She says she has been taking Flintstone chewable with iron 1 or 2 tablets daily since this was recommended by me back in 2019.  Prior GI work-up for iron deficiency anemia is as follows  EGD and colonoscopy on  12/21/2016. EGD revealed small sliding hiatal hernia mild gastritis without evidence of H. 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  These were ablated with APC. Colonoscopy revealed 2 small tubular adenomas.  She underwent repeat EGD on 02/27/2018 for persistent anemia. She had small sliding hiatal hernia but no other abnormalities noted in the stomach or duodenum.  He had small bowel given capsule study on 03/07/2018.  The study was incomplete as capsule never reached the cecum.  However no bleeding lesions are identified in the stomach or small bowel.  Current Medications: Outpatient Encounter Medications as of 02/01/2020  Medication Sig  . acetaminophen (TYLENOL) 500 MG tablet Take 1,000 mg by mouth every 6 (six) hours as needed for mild pain or headache.   Marland Kitchen amLODipine (NORVASC) 5 MG tablet TAKE ONE TABLET BY MOUTH DAILY. (Patient taking differently: Take 5 mg by mouth daily. )  . aspirin 81 MG tablet Take 1 tablet (81 mg total) by mouth daily. Restart on 03/02/18  . cetirizine (ZYRTEC) 10 MG tablet TAKE ONE TABLET BY MOUTH DAILY.  Marland Kitchen Cholecalciferol (VITAMIN D3) 1000 UNITS CAPS Take 1 capsule by mouth daily.  . fish oil-omega-3 fatty acids 1000 MG capsule Take 1,200 mg by mouth daily.   . fluticasone (FLONASE) 50 MCG/ACT nasal spray USE 2 SPRAYS IN EACH NOSTRIL ONCE DAILY.  SHAKE GENTLY BEFORE USING. (Patient taking differently: Place 2  sprays into both nostrils daily. )  . hydrALAZINE (APRESOLINE) 25 MG tablet TAKE ONE TABLET BY MOUTH THREE TIMES DAILY (Patient taking differently: Take 25 mg by mouth 3 (three) times daily. )  . hydrALAZINE (APRESOLINE) 50 MG tablet Take 1 tablet (50 mg total) by mouth 3 (three) times daily.  Marland Kitchen latanoprost (XALATAN) 0.005 % ophthalmic solution Place 1 drop into both eyes at bedtime. One drop each eye at bedtime   . levothyroxine (SYNTHROID) 100 MCG tablet Take 1 tablet (100 mcg total) by mouth daily.  Marland Kitchen  lisinopril (ZESTRIL) 2.5 MG tablet Take 2.5 mg by mouth. Patient states that she takes at 1 pm daily  . metoprolol succinate (TOPROL-XL) 100 MG 24 hr tablet TAKE ONE TABLET BY MOUTH DAILY. TAKE WITH OR IMMEDIATELY FOLLOWING A MEAL. (Patient taking differently: Take 100 mg by mouth daily. TAKE ONE TABLET BY MOUTH DAILY. TAKE WITH OR IMMEDIATELY FOLLOWING A MEAL.)  . Misc. Devices (WALL GRAB BAR) MISC Grab bar for shower x 2 DX high fall risk  . nitroGLYCERIN (NITROSTAT) 0.4 MG SL tablet DISSOLVE ONE TABLET UNDER TONGUE EVERY 5 MINUTES UP TO 3 DOSES AS NEEDED FOR CHEST PAIN.  Marland Kitchen omeprazole (PRILOSEC) 40 MG capsule TAKE ONE CAPSULE BY MOUTH DAILY.  Marland Kitchen Pediatric Multivitamins-Iron (FLINTSTONES COMPLETE PO) Take 2 tablets by mouth daily. With iron   . Pediatric Multivitamins-Iron Vicki Mallet W/IRON PO) Take by mouth in the morning and at bedtime.  . rosuvastatin (CRESTOR) 10 MG tablet TAKE ONE TABLET BY MOUTH DAILY. (Patient taking differently: Take 10 mg by mouth daily. )  . [DISCONTINUED] Multiple Vitamins-Minerals (THERATRUM COMPLETE 50 PLUS PO) Take 1 tablet by mouth daily.  (Patient not taking: Reported on 02/01/2020)   No facility-administered encounter medications on file as of 02/01/2020.   Past Medical History:  Diagnosis Date  . 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 Carotid artery disease ([place])   . CKD (chronic kidney disease), stage III   . Essential hypertension   . Glaucoma    Laser surgery 2010  . Hyperlipidemia   . Hypothyroidism   . Iron deficiency anemia   . Obesity    Past Surgical History:  Procedure Laterality Date  . COLONOSCOPY N/A 02/04/2013   Procedure: COLONOSCOPY;  Surgeon: Rogene Houston, MD;  [facility];  Service: Endoscopy;  Laterality: N/A;  1015  . COLONOSCOPY N/A 12/21/2016   Procedure: COLONOSCOPY;  Surgeon: Rogene Houston, MD;  [facility];  Service: Endoscopy;  Laterality: N/A;  . CORONARY  ARTERY BYPASS GRAFT  October 2005   Dr. Roxy Manns - LIMA to LAD, SVG to OM 2 and OM 3, SVG to PDA  . ESOPHAGOGASTRODUODENOSCOPY N/A 12/21/2016   Procedure: ESOPHAGOGASTRODUODENOSCOPY (EGD);  Surgeon: Rogene Houston, MD;  [facility];  Service: Endoscopy;  Laterality: N/A;  11:10  . ESOPHAGOGASTRODUODENOSCOPY N/A 02/27/2018   Procedure: ESOPHAGOGASTRODUODENOSCOPY (EGD);  Surgeon: Rogene Houston, MD;  [facility];  Service: Endoscopy;  Laterality: N/A;  . EYE SURGERY  2010   laser treatment to both eyes for glaucoma  . GIVENS CAPSULE STUDY N/A 03/07/2018   Procedure: GIVENS CAPSULE STUDY;  Surgeon: Rogene Houston, MD;  [facility];  Service: Endoscopy;  Laterality: N/A;   Allergies Allergies  Allergen Reactions  . Tramadol Itching  . Clonidine Derivatives Other (See Comments)    Zombie BP dropped      Objective: Blood pressure Marland Kitchen)  194/69, pulse (!) 53, temperature 97.9 F (36.6 C), temperature source Oral, height '5\' 1"'  (1.549 m), weight 172 lb 8 oz (78.2 kg). Patient is alert and in no acute distress. She uses cane to ambulate. She is wearing a mask. Conjunctiva is pale. Sclera is nonicteric Oropharyngeal mucosa is normal. No neck masses or thyromegaly noted. She has midsternal scar. Cardiac exam with regular rhythm normal S1 and S2.  She has grade 2/6 systolic murmur noted best at upper left sternal border. Lungs are clear to auscultation. Abdomen is full.  Bowel sounds are normal.  On palpation is soft and nontender with organomegaly or masses. No LE edema, clubbing or koilonychia noted.  Labs/studies Results:  CBC Latest Ref Rng & Units 01/19/2020 01/18/2020 03/31/2019  WBC 4.0 - 10.5 K/uL 6.1 5.7 7.7  Hemoglobin 12.0 - 15.0 g/dL 7.6(L) 7.5(L) 9.4(L)  Hematocrit 36 - 46 % 24.2(L) 23.5(L) 29.5(L)  Platelets 150 - 400 K/uL 245 245 239    CMP Latest Ref Rng & Units 01/19/2020 01/18/2020 07/21/2019  Glucose 70 - 99 mg/dL 99 86 94  BUN 8 - 23  mg/dL '22 22 17  ' Creatinine 0.44 - 1.00 mg/dL 1.68(H) 1.58(H) 1.66(H)  Sodium 135 - 145 mmol/L 136 137 141  Potassium 3.5 - 5.1 mmol/L 4.4 4.2 4.4  Chloride 98 - 111 mmol/L 103 106 107  CO2 22 - 32 mmol/L '22 25 26  ' Calcium 8.9 - 10.3 mg/dL 9.8 10.3 10.4  Total Protein 6.5 - 8.1 g/dL 7.4 - 7.4  Total Bilirubin 0.3 - 1.2 mg/dL 0.4 - 0.4  Alkaline Phos 38 - 126 U/L 71 - -  AST 15 - 41 U/L 22 - 20  ALT 0 - 44 U/L 17 - 12    Hepatic Function Latest Ref Rng & Units 01/19/2020 07/21/2019 03/31/2019  Total Protein 6.5 - 8.1 g/dL 7.4 7.4 7.7  Albumin 3.5 - 5.0 g/dL 3.8 - -  AST 15 - 41 U/L '22 20 20  ' ALT 0 - 44 U/L '17 12 12  ' Alk Phosphatase 38 - 126 U/L 71 - -  Total Bilirubin 0.3 - 1.2 mg/dL 0.4 0.4 0.4  Bilirubin, Direct 0.0 - 0.3 mg/dL - - -    Iron studies B12 and folate levels as above.   Assessment:  #1.  Patient is 79 year old female with multiple medical problems including chronic kidney disease coronary artery disease and hypertension who has history of iron deficiency anemia dating back to 3 years ago.  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  Colonoscopy revealed 2 small tubular adenomas but no other bleeding lesions.  EGD in August 2019 did not reveal any bleeding lesions followed by small bowel given capsule study was also negative but incomplete as capsule did not reach cecum. It is worth noting that patient's hemoglobin back in May 2020 was 9.3 when serum iron was normal at 42 and ferritin was low normal at 23.  Therefore chronic disease may also be contributing to her anemia. Since she is losing blood from her GI tract reevaluation would be appropriate  #2.  Cough possibly triggered by lisinopril.  If cough gets worse she will call Dr. Toya Smothers office for further recommendations.  #3.  Chronic GERD.  She is asymptomatic on 40 mg of omeprazole.  Therefore dose could be reduced to 20  mg daily  Plan:  Discontinue Flintstone chewable with iron. Small bowel given capsule study as  soon as possible. Will check H&H at the time of small bowel given capsule study which hopefully would be later this week. Begin ferrous sulfate at a dose of 325 mg p.o. twice daily when small bowel study completed. Hemoglobin remains low will consider iron infusion. Decrease omeprazole to 20 mg by mouth 30 minutes before breakfast daily. Office visit in 3 months.

## 2020-02-05 ENCOUNTER — Encounter (HOSPITAL_COMMUNITY): Payer: Self-pay | Admitting: Internal Medicine

## 2020-02-05 ENCOUNTER — Ambulatory Visit (HOSPITAL_COMMUNITY)
Admission: RE | Admit: 2020-02-05 | Discharge: 2020-02-05 | Disposition: A | Discharge status: Home / Self Care | Payer: Medicare Other | Attending: Internal Medicine | Admitting: Internal Medicine

## 2020-02-05 ENCOUNTER — Encounter (HOSPITAL_COMMUNITY)
Admission: RE | Disposition: A | Discharge status: Home / Self Care | Payer: Self-pay | Source: Home / Self Care | Attending: Internal Medicine

## 2020-02-05 DIAGNOSIS — D509 Iron deficiency anemia, unspecified: Secondary | ICD-10-CM | POA: Insufficient documentation

## 2020-02-05 DIAGNOSIS — D631 Anemia in chronic kidney disease: Secondary | ICD-10-CM | POA: Insufficient documentation

## 2020-02-05 DIAGNOSIS — I2510 Atherosclerotic heart disease of native coronary artery without angina pectoris: Secondary | ICD-10-CM | POA: Insufficient documentation

## 2020-02-05 DIAGNOSIS — K449 Diaphragmatic hernia without obstruction or gangrene: Secondary | ICD-10-CM | POA: Insufficient documentation

## 2020-02-05 DIAGNOSIS — K219 Gastro-esophageal reflux disease without esophagitis: Secondary | ICD-10-CM | POA: Diagnosis not present

## 2020-02-05 DIAGNOSIS — N183 Chronic kidney disease, stage 3 unspecified: Secondary | ICD-10-CM | POA: Diagnosis not present

## 2020-02-05 DIAGNOSIS — Z951 Presence of aortocoronary bypass graft: Secondary | ICD-10-CM | POA: Diagnosis not present

## 2020-02-05 DIAGNOSIS — Z79899 Other long term (current) drug therapy: Secondary | ICD-10-CM | POA: Insufficient documentation

## 2020-02-05 DIAGNOSIS — Z885 Allergy status to narcotic agent status: Secondary | ICD-10-CM | POA: Insufficient documentation

## 2020-02-05 DIAGNOSIS — E669 Obesity, unspecified: Secondary | ICD-10-CM | POA: Insufficient documentation

## 2020-02-05 DIAGNOSIS — I129 Hypertensive chronic kidney disease with stage 1 through stage 4 chronic kidney disease, or unspecified chronic kidney disease: Secondary | ICD-10-CM | POA: Insufficient documentation

## 2020-02-05 DIAGNOSIS — Z7982 Long term (current) use of aspirin: Secondary | ICD-10-CM | POA: Insufficient documentation

## 2020-02-05 DIAGNOSIS — E785 Hyperlipidemia, unspecified: Secondary | ICD-10-CM | POA: Insufficient documentation

## 2020-02-05 DIAGNOSIS — E039 Hypothyroidism, unspecified: Secondary | ICD-10-CM | POA: Diagnosis not present

## 2020-02-05 DIAGNOSIS — Z888 Allergy status to other drugs, medicaments and biological substances status: Secondary | ICD-10-CM | POA: Diagnosis not present

## 2020-02-05 HISTORY — PX: GIVENS CAPSULE STUDY: SHX5432

## 2020-02-05 SURGERY — IMAGING PROCEDURE, GI TRACT, INTRALUMINAL, VIA CAPSULE

## 2020-02-06 DIAGNOSIS — K3189 Other diseases of stomach and duodenum: Secondary | ICD-10-CM | POA: Diagnosis not present

## 2020-02-06 DIAGNOSIS — D500 Iron deficiency anemia secondary to blood loss (chronic): Secondary | ICD-10-CM | POA: Diagnosis not present

## 2020-02-06 NOTE — Op Note (Signed)
Small Bowel Givens Capsule Study Procedure date: 02/06/2020.  Referring Provider: Tula Nakayama, MD PCP:  Dr. Fayrene Helper, MD  Indication for procedure: 02/05/2020   Findings:    Patient was able to swallow given capsule without any difficulty.  Study duration 8 hours 5 minutes and 58 seconds. Study is incomplete as capsule did not reach cecum. Focal area of erythema and edema noted to antral mucosa without stigmata of bleeding.  No mucosal abnormalities noted in portion of small bowel that was examined.  First Gastric image: 52 sec First Duodenal image: 18 min and 34 sec First Ileo-Cecal Valve image: Not applicable as cecum not reached First Cecal image: Not applicable Gastric Passage time: 17 min and 42 sec Small Bowel Passage time: Not applicable  Summary & Recommendations:  Focal erythema and edema involving antral mucosa without stigmata of bleed. Incomplete evaluation of small bowel as given capsule did not reach cecum. No mucosal abnormalities noted in the small bowel that was examined.  Results reviewed with patient earlier today. Patient advised to resume ferrous sulfate at a dose of 325 mg p.o. twice daily. Patient to have H&H on 02/10/2020. Further recommendations to follow.

## 2020-02-08 NOTE — Interval H&P Note (Signed)
Patient has not experienced melena or rectal bleeding since she was seen in the office earlier this week. She has not taken p.o. iron as recommended. Will proceed with small bowel given capsule study.  History and Physical Interval Note:  02/08/2020 8:30 AM  [name] [name]  has presented today for surgery, with the diagnosis of iron def anemia, heme positive stool.  The various methods of treatment have been discussed with the patient and family. After consideration of risks, benefits and other options for treatment, the patient has consented to  Procedure(s) with comments: [place] (N/A) - 730 as a surgical intervention.  The patient's history has been reviewed, patient examined, no change in status, stable for surgery.  I have reviewed the patient's chart and labs.  Questions were answered to the patient's satisfaction.     [employer]

## 2020-02-09 ENCOUNTER — Encounter (HOSPITAL_COMMUNITY): Payer: Self-pay | Admitting: Internal Medicine

## 2020-02-09 ENCOUNTER — Other Ambulatory Visit: Payer: Self-pay | Admitting: Family Medicine

## 2020-02-10 ENCOUNTER — Other Ambulatory Visit (INDEPENDENT_AMBULATORY_CARE_PROVIDER_SITE_OTHER): Payer: Self-pay | Admitting: *Deleted

## 2020-02-10 DIAGNOSIS — K921 Melena: Secondary | ICD-10-CM

## 2020-02-10 DIAGNOSIS — I129 Hypertensive chronic kidney disease with stage 1 through stage 4 chronic kidney disease, or unspecified chronic kidney disease: Secondary | ICD-10-CM | POA: Diagnosis not present

## 2020-02-10 DIAGNOSIS — D500 Iron deficiency anemia secondary to blood loss (chronic): Secondary | ICD-10-CM | POA: Diagnosis not present

## 2020-02-10 DIAGNOSIS — D508 Other iron deficiency anemias: Secondary | ICD-10-CM | POA: Diagnosis not present

## 2020-02-10 DIAGNOSIS — N1832 Chronic kidney disease, stage 3b: Secondary | ICD-10-CM | POA: Diagnosis not present

## 2020-02-10 DIAGNOSIS — R809 Proteinuria, unspecified: Secondary | ICD-10-CM | POA: Diagnosis not present

## 2020-02-11 LAB — HEMOGLOBIN AND HEMATOCRIT, BLOOD
Hematocrit: 27.8 % — ABNORMAL LOW (ref 34.0–46.6)
Hemoglobin: 9 g/dL — ABNORMAL LOW (ref 11.1–15.9)

## 2020-02-12 ENCOUNTER — Other Ambulatory Visit (INDEPENDENT_AMBULATORY_CARE_PROVIDER_SITE_OTHER): Payer: Self-pay | Admitting: Internal Medicine

## 2020-02-12 DIAGNOSIS — D500 Iron deficiency anemia secondary to blood loss (chronic): Secondary | ICD-10-CM

## 2020-02-18 ENCOUNTER — Ambulatory Visit (INDEPENDENT_AMBULATORY_CARE_PROVIDER_SITE_OTHER): Payer: Medicare Other | Admitting: Gastroenterology

## 2020-02-23 ENCOUNTER — Telehealth (INDEPENDENT_AMBULATORY_CARE_PROVIDER_SITE_OTHER): Payer: Self-pay | Admitting: Gastroenterology

## 2020-02-23 NOTE — Progress Notes (Signed)
Virtual Visit via Telephone Note   This visit type was conducted due to national recommendations for restrictions regarding the COVID-19 Pandemic (e.g. social distancing) in an effort to limit this patient's exposure and mitigate transmission in our community.  Due to her co-morbid illnesses, this patient is at least at moderate risk for complications without adequate follow up.  This format is felt to be most appropriate for this patient at this time.  The patient did not have access to video technology/had technical difficulties with video requiring transitioning to audio format only (telephone).  All issues noted in this document were discussed and addressed.  No physical exam could be performed with this format.  Please refer to the patient's chart for her  consent to telehealth for [facility].    Date:  02/24/2020   ID:  [name] [name], DOB [birth year], MRN [id number] The patient was identified using 2 identifiers.  Patient Location: Home Provider Location: Office/Clinic  PCP:  Fayrene Helper, MD  Cardiologist:  Rozann Lesches, MD Electrophysiologist:  None   Evaluation Performed:  Follow-Up Visit  Chief Complaint:   Cardiac follow-up  History of Present Illness:    [name] [name] is a 79 y.o. female last seen in August 2020.  We spoke by phone today.  She does not report any significant angina symptoms or worsening shortness of breath.  No reported palpitations, dizziness, or syncope.  She continues to follow with Dr. Moshe Cipro, I reviewed the recent notes and also her current antihypertensive medications.  Systolic blood pressure was 140 at recent visit with Dr. Moshe Cipro, higher today, but she had not yet taken her morning medications.  Records indicate recent GI evaluation by Dr. Laural Golden, iron deficiency anemia with chronic GI blood loss suspected.  She has known history of previously documented duodenal AVM.  Recent capsule study noted as well.  She does not report any  obvious melena or hematochezia.  I reviewed her lab work from January at which point LDL was 61.  She is tolerating Crestor.  She is due for follow-up carotid Dopplers in September.  I reviewed her echocardiogram from December 2020 as outlined below.  LVEF 60 to 65% at that time, no major valvular abnormalities.  Past Medical History:  Diagnosis Date  . 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 Carotid artery disease ([place])   . CKD (chronic kidney disease), stage III   . Essential hypertension   . Glaucoma    Laser surgery 2010  . Hyperlipidemia   . Hypothyroidism   . Iron deficiency anemia   . Obesity    Past Surgical History:  Procedure Laterality Date  . COLONOSCOPY N/A 02/04/2013   Procedure: COLONOSCOPY;  Surgeon: Rogene Houston, MD;  [facility];  Service: Endoscopy;  Laterality: N/A;  1015  . COLONOSCOPY N/A 12/21/2016   Procedure: COLONOSCOPY;  Surgeon: Rogene Houston, MD;  [facility];  Service: Endoscopy;  Laterality: N/A;  . CORONARY ARTERY BYPASS GRAFT  October 2005   Dr. Roxy Manns - LIMA to LAD, SVG to OM 2 and OM 3, SVG to PDA  . ESOPHAGOGASTRODUODENOSCOPY N/A 12/21/2016   Procedure: ESOPHAGOGASTRODUODENOSCOPY (EGD);  Surgeon: Rogene Houston, MD;  [facility];  Service: Endoscopy;  Laterality: N/A;  11:10  . ESOPHAGOGASTRODUODENOSCOPY N/A 02/27/2018   Procedure: ESOPHAGOGASTRODUODENOSCOPY (EGD);  Surgeon: Rogene Houston, MD;  [facility];  Service: Endoscopy;  Laterality: N/A;  .  EYE SURGERY  2010   laser treatment to both eyes for glaucoma  . GIVENS CAPSULE STUDY N/A 03/07/2018   Procedure: GIVENS CAPSULE STUDY;  Surgeon: Rogene Houston, MD;  [facility];  Service: Endoscopy;  Laterality: N/A;  . GIVENS CAPSULE STUDY N/A 02/05/2020   Procedure: GIVENS CAPSULE STUDY;  Surgeon: Rogene Houston, MD;  [facility];  Service: Endoscopy;  Laterality: N/A;  730      Current Meds  Medication Sig  . acetaminophen (TYLENOL) 500 MG tablet Take 1,000 mg by mouth every 6 (six) hours as needed for mild pain or headache.   Marland Kitchen amLODipine (NORVASC) 5 MG tablet TAKE ONE TABLET BY MOUTH DAILY. (Patient taking differently: Take 5 mg by mouth daily. )  . aspirin 81 MG tablet Take 1 tablet (81 mg total) by mouth daily. Restart on 03/02/18  . cetirizine (ZYRTEC) 10 MG tablet TAKE ONE TABLET BY MOUTH DAILY.  Marland Kitchen Cholecalciferol (VITAMIN D3) 1000 UNITS CAPS Take 1 capsule by mouth daily.  . ferrous sulfate 325 (65 FE) MG tablet Take 1 tablet (325 mg total) by mouth 2 (two) times daily with a meal.  . fish oil-omega-3 fatty acids 1000 MG capsule Take 1,200 mg by mouth daily.   . fluticasone (FLONASE) 50 MCG/ACT nasal spray USE 2 SPRAYS IN EACH NOSTRIL ONCE DAILY.  SHAKE GENTLY BEFORE USING. (Patient taking differently: Place 2 sprays into both nostrils daily. )  . hydrALAZINE (APRESOLINE) 25 MG tablet TAKE ONE TABLET BY MOUTH THREE TIMES DAILY (Patient taking differently: Take 25 mg by mouth 3 (three) times daily. )  . hydrALAZINE (APRESOLINE) 50 MG tablet TAKE ONE TABLET BY MOUTH THREE TIMES DAILY.  Marland Kitchen latanoprost (XALATAN) 0.005 % ophthalmic solution Place 1 drop into both eyes at bedtime. One drop each eye at bedtime   . levothyroxine (SYNTHROID) 100 MCG tablet Take 1 tablet (100 mcg total) by mouth daily.  . metoprolol succinate (TOPROL-XL) 100 MG 24 hr tablet TAKE ONE TABLET BY MOUTH DAILY. TAKE WITH OR IMMEDIATELY FOLLOWING A MEAL. (Patient taking differently: Take 100 mg by mouth daily. TAKE ONE TABLET BY MOUTH DAILY. TAKE WITH OR IMMEDIATELY FOLLOWING A MEAL.)  . nitroGLYCERIN (NITROSTAT) 0.4 MG SL tablet DISSOLVE ONE TABLET UNDER TONGUE EVERY 5 MINUTES UP TO 3 DOSES AS NEEDED FOR CHEST PAIN.  Marland Kitchen omeprazole (PRILOSEC) 20 MG capsule Take 1 capsule (20 mg total) by mouth daily.  . rosuvastatin (CRESTOR) 10 MG tablet TAKE ONE TABLET BY MOUTH DAILY. (Patient taking differently:  Take 10 mg by mouth daily. )     Allergies:   Tramadol, Lisinopril, and Clonidine derivatives   ROS:   No palpitations or syncope.  Prior CV studies:   The following studies were reviewed today:  Echocardiogram 06/03/2019: 1. Left ventricular ejection fraction, by visual estimation, is 60 to  65%. The left ventricle has normal function. There is borderline left  ventricular hypertrophy.  2. Elevated left ventricular end-diastolic pressure.  3. Left ventricular diastolic parameters are consistent with Grade II  diastolic dysfunction (pseudonormalization).  4. The left ventricle has no regional wall motion abnormalities.  5. Global right ventricle has normal systolic function.The right  ventricular size is normal. No increase in right ventricular wall  thickness.  6. Left atrial size was severely dilated.  7. Right atrial size was mildly dilated.  8. Small pericardial effusion.  9. Mild aortic valve annular calcification.  10. Mild mitral annular calcification.  11. The mitral valve is grossly  normal. Mild mitral valve regurgitation.  12. The tricuspid valve is grossly normal. Tricuspid valve regurgitation  mild-moderate.  13. The aortic valve is tricuspid. Aortic valve regurgitation is not  visualized.  14. The pulmonic valve was grossly normal. Pulmonic valve regurgitation is  trivial.  15. Mildly elevated pulmonary artery systolic pressure.  16. The inferior vena cava is normal in size with greater than 50%  respiratory variability, suggesting right atrial pressure of 3 mmHg.   Carotid Dopplers 09/03/2019: Summary:  Right Carotid: Velocities in the right ICA are consistent with a 60-79%         stenosis.   Left Carotid: Velocities in the left ICA are consistent with a 40-59%  stenosis.        The ECA appears >50% stenosed.   Vertebrals: Bilateral vertebral arteries demonstrate antegrade flow.  Subclavians: Normal flow hemodynamics were seen in  bilateral subclavian arteries.  Labs/Other Tests and Data Reviewed:    EKG:  An ECG dated 01/19/2020 was personally reviewed today and demonstrated:  Probable sinus bradycardia with LVH and diffuse repolarization abnormalities.  Recent Labs: 01/18/2020: TSH 8.93 01/19/2020: ALT 17; BUN 22; Creatinine, Ser 1.68; Platelets 245; Potassium 4.4; Sodium 136 02/10/2020: Hemoglobin 9.0   Recent Lipid Panel Lab Results  Component Value Date/Time   CHOL 133 07/21/2019 09:38 AM   TRIG 118 07/21/2019 09:38 AM   HDL 52 07/21/2019 09:38 AM   CHOLHDL 2.6 07/21/2019 09:38 AM   LDLCALC 61 07/21/2019 09:38 AM    Wt Readings from Last 3 Encounters:  02/24/20 163 lb (73.9 kg)  02/01/20 172 lb 8 oz (78.2 kg)  01/25/20 174 lb 1.3 oz (79 kg)     Objective:    Vital Signs:  BP (!) 172/66   Pulse (!) 52   Temp 97.9 F (36.6 C)   Ht 5\' 1"  (1.549 m)   Wt 163 lb (73.9 kg)   SpO2 98%   BMI 30.80 kg/m   Patient spoke in full sentences, not short of breath on the phone.  ASSESSMENT & PLAN:    1.  Multivessel CAD status post CABG in 2005.  Patient reports no active angina and continues on medical therapy with strategy of observation.  Continue aspirin, Norvasc, Toprol-XL, and Crestor.  2.  Essential hypertension, currently on Norvasc, hydralazine, and Toprol-XL.  She continues to follow with Dr. Moshe Cipro.  No changes were made today.  3.  Mixed hyperlipidemia, on Crestor with most recent LDL 61.  4.  Carotid artery disease as outlined above.  She is due for follow-up carotid Dopplers.  Continue aspirin and statin.   Time:   Today, I have spent 9 minutes with the patient with telehealth technology discussing the above problems.     Medication Adjustments/Labs and Tests Ordered: Current medicines are reviewed at length with the patient today.  Concerns regarding medicines are outlined above.   Tests Ordered: No orders of the defined types were placed in this encounter.   Medication  Changes: No orders of the defined types were placed in this encounter.   Follow Up:  In Person 6 months in the [place] office.  Signed, Rozann Lesches, MD  02/24/2020 9:02 AM    [place]

## 2020-02-23 NOTE — Telephone Encounter (Signed)
Patient left voice mail message stating she would like her lab order to be faxed to [facility]  -  Patient ph# [phone]

## 2020-02-24 ENCOUNTER — Encounter: Payer: Self-pay | Admitting: Cardiology

## 2020-02-24 ENCOUNTER — Telehealth (INDEPENDENT_AMBULATORY_CARE_PROVIDER_SITE_OTHER): Payer: Medicare Other | Admitting: Cardiology

## 2020-02-24 ENCOUNTER — Telehealth: Payer: Self-pay | Admitting: *Deleted

## 2020-02-24 VITALS — BP 172/66 | HR 52 | Temp 97.9°F | Ht 61.0 in | Wt 163.0 lb

## 2020-02-24 DIAGNOSIS — E782 Mixed hyperlipidemia: Secondary | ICD-10-CM

## 2020-02-24 DIAGNOSIS — I25119 Atherosclerotic heart disease of native coronary artery with unspecified angina pectoris: Secondary | ICD-10-CM | POA: Diagnosis not present

## 2020-02-24 DIAGNOSIS — D500 Iron deficiency anemia secondary to blood loss (chronic): Secondary | ICD-10-CM | POA: Diagnosis not present

## 2020-02-24 DIAGNOSIS — I6523 Occlusion and stenosis of bilateral carotid arteries: Secondary | ICD-10-CM | POA: Diagnosis not present

## 2020-02-24 DIAGNOSIS — I10 Essential (primary) hypertension: Secondary | ICD-10-CM | POA: Diagnosis not present

## 2020-02-24 DIAGNOSIS — R011 Cardiac murmur, unspecified: Secondary | ICD-10-CM

## 2020-02-24 NOTE — Patient Instructions (Addendum)

## 2020-02-24 NOTE — Telephone Encounter (Signed)
°  Patient Consent for Virtual Visit         [name] [name] has provided verbal consent on 02/24/2020 for a virtual visit (video or telephone).   CONSENT FOR VIRTUAL VISIT FOR:  [name] [name]  By participating in this virtual visit I agree to the following:  I hereby voluntarily request, consent and authorize [facility] and its employed or contracted physicians, Engineer, materials, nurse practitioners or other licensed health care professionals (the Practitioner), to provide me with telemedicine health care services (the Services") as deemed necessary by the treating Practitioner. I acknowledge and consent to receive the Services by the Practitioner via telemedicine. I understand that the telemedicine visit will involve communicating with the Practitioner through live audiovisual communication technology and the disclosure of certain medical information by electronic transmission. I acknowledge that I have been given the opportunity to request an in-person assessment or other available alternative prior to the telemedicine visit and am voluntarily participating in the telemedicine visit.  I understand that I have the right to withhold or withdraw my consent to the use of telemedicine in the course of my care at any time, without affecting my right to future care or treatment, and that the Practitioner or I may terminate the telemedicine visit at any time. I understand that I have the right to inspect all information obtained and/or recorded in the course of the telemedicine visit and may receive copies of available information for a reasonable fee.  I understand that some of the potential risks of receiving the Services via telemedicine include:   Delay or interruption in medical evaluation due to technological equipment failure or disruption;  Information transmitted may not be sufficient (e.g. poor resolution of images) to allow for appropriate medical decision making by the Practitioner;  and/or   In rare instances, security protocols could fail, causing a breach of personal health information.  Furthermore, I acknowledge that it is my responsibility to provide information about my medical history, conditions and care that is complete and accurate to the best of my ability. I acknowledge that Practitioner's advice, recommendations, and/or decision may be based on factors not within their control, such as incomplete or inaccurate data provided by me or distortions of diagnostic images or specimens that may result from electronic transmissions. I understand that the practice of medicine is not an exact science and that Practitioner makes no warranties or guarantees regarding treatment outcomes. I acknowledge that a copy of this consent can be made available to me via my patient portal ([facility]), or I can request a printed copy by calling the office of [facility].    I understand that my insurance will be billed for this visit.   I have read or had this consent read to me.  I understand the contents of this consent, which adequately explains the benefits and risks of the Services being provided via telemedicine.   I have been provided ample opportunity to ask questions regarding this consent and the Services and have had my questions answered to my satisfaction.  I give my informed consent for the services to be provided through the use of telemedicine in my medical care

## 2020-02-25 NOTE — Telephone Encounter (Signed)
Faxed order to [facility]  for patient , called and spoke with patient  to inform about this.

## 2020-03-08 ENCOUNTER — Other Ambulatory Visit: Payer: Self-pay | Admitting: Family Medicine

## 2020-03-10 ENCOUNTER — Ambulatory Visit (INDEPENDENT_AMBULATORY_CARE_PROVIDER_SITE_OTHER): Payer: Medicare Other | Admitting: Gastroenterology

## 2020-03-25 DIAGNOSIS — Z79899 Other long term (current) drug therapy: Secondary | ICD-10-CM | POA: Diagnosis not present

## 2020-03-25 DIAGNOSIS — R809 Proteinuria, unspecified: Secondary | ICD-10-CM | POA: Diagnosis not present

## 2020-03-25 DIAGNOSIS — N1832 Chronic kidney disease, stage 3b: Secondary | ICD-10-CM | POA: Diagnosis not present

## 2020-03-30 ENCOUNTER — Ambulatory Visit (INDEPENDENT_AMBULATORY_CARE_PROVIDER_SITE_OTHER): Payer: Medicare Other

## 2020-03-30 ENCOUNTER — Other Ambulatory Visit: Payer: Self-pay | Admitting: Cardiology

## 2020-03-30 DIAGNOSIS — I6523 Occlusion and stenosis of bilateral carotid arteries: Secondary | ICD-10-CM | POA: Diagnosis not present

## 2020-03-31 DIAGNOSIS — I129 Hypertensive chronic kidney disease with stage 1 through stage 4 chronic kidney disease, or unspecified chronic kidney disease: Secondary | ICD-10-CM | POA: Diagnosis not present

## 2020-03-31 DIAGNOSIS — D631 Anemia in chronic kidney disease: Secondary | ICD-10-CM | POA: Diagnosis not present

## 2020-03-31 DIAGNOSIS — N1832 Chronic kidney disease, stage 3b: Secondary | ICD-10-CM | POA: Diagnosis not present

## 2020-03-31 DIAGNOSIS — R6889 Other general symptoms and signs: Secondary | ICD-10-CM | POA: Diagnosis not present

## 2020-03-31 DIAGNOSIS — R809 Proteinuria, unspecified: Secondary | ICD-10-CM | POA: Diagnosis not present

## 2020-04-01 ENCOUNTER — Telehealth: Payer: Self-pay | Admitting: *Deleted

## 2020-04-01 NOTE — Telephone Encounter (Signed)
-----   Message from Satira Sark, MD sent at 04/01/2020  1:08 PM EDT ----- Results reviewed.  Carotid studies show lower velocities than prior study suggesting somewhat less degree of stenosis at this point, moderate RICA and mild LICA.  Would continue aspirin and Crestor.  Recheck carotid Dopplers in 1 year.

## 2020-04-01 NOTE — Telephone Encounter (Signed)
Pt voiced understanding - recall placed

## 2020-04-04 ENCOUNTER — Telehealth: Payer: Self-pay

## 2020-04-04 NOTE — Telephone Encounter (Signed)
Pt wants a RX for a Civil engineer, contracting

## 2020-04-04 NOTE — Telephone Encounter (Signed)
Will need  face to face exam, will address at appt, please let her know. OK to [facility] appointment forward if this makes sense/ is necessary

## 2020-04-04 NOTE — Telephone Encounter (Signed)
Can I do this?

## 2020-04-05 ENCOUNTER — Other Ambulatory Visit: Payer: Self-pay | Admitting: Family Medicine

## 2020-04-05 NOTE — Telephone Encounter (Signed)
Patient aware of the need for an appt to address the need for a shower chair. She will just keep the appt already schedule at the end of the month.

## 2020-04-14 DIAGNOSIS — D631 Anemia in chronic kidney disease: Secondary | ICD-10-CM | POA: Diagnosis not present

## 2020-04-14 DIAGNOSIS — N189 Chronic kidney disease, unspecified: Secondary | ICD-10-CM | POA: Diagnosis not present

## 2020-04-14 DIAGNOSIS — Z7289 Other problems related to lifestyle: Secondary | ICD-10-CM | POA: Diagnosis not present

## 2020-04-14 DIAGNOSIS — I129 Hypertensive chronic kidney disease with stage 1 through stage 4 chronic kidney disease, or unspecified chronic kidney disease: Secondary | ICD-10-CM | POA: Diagnosis not present

## 2020-04-14 DIAGNOSIS — E065 Other chronic thyroiditis: Secondary | ICD-10-CM | POA: Diagnosis not present

## 2020-04-14 DIAGNOSIS — R809 Proteinuria, unspecified: Secondary | ICD-10-CM | POA: Diagnosis not present

## 2020-04-14 DIAGNOSIS — I10 Essential (primary) hypertension: Secondary | ICD-10-CM | POA: Diagnosis not present

## 2020-04-14 DIAGNOSIS — E785 Hyperlipidemia, unspecified: Secondary | ICD-10-CM | POA: Diagnosis not present

## 2020-04-14 DIAGNOSIS — E038 Other specified hypothyroidism: Secondary | ICD-10-CM | POA: Diagnosis not present

## 2020-04-14 DIAGNOSIS — D500 Iron deficiency anemia secondary to blood loss (chronic): Secondary | ICD-10-CM | POA: Diagnosis not present

## 2020-04-14 DIAGNOSIS — N1832 Chronic kidney disease, stage 3b: Secondary | ICD-10-CM | POA: Diagnosis not present

## 2020-04-14 DIAGNOSIS — Z1159 Encounter for screening for other viral diseases: Secondary | ICD-10-CM | POA: Diagnosis not present

## 2020-04-27 ENCOUNTER — Encounter: Payer: Medicare Other | Admitting: Family Medicine

## 2020-04-28 ENCOUNTER — Ambulatory Visit (INDEPENDENT_AMBULATORY_CARE_PROVIDER_SITE_OTHER): Payer: Medicare Other | Admitting: Family Medicine

## 2020-04-28 ENCOUNTER — Encounter: Payer: Self-pay | Admitting: Family Medicine

## 2020-04-28 ENCOUNTER — Other Ambulatory Visit: Payer: Self-pay

## 2020-04-28 VITALS — BP 170/70 | HR 55 | Resp 16 | Ht 61.0 in | Wt 174.0 lb

## 2020-04-28 DIAGNOSIS — M5136 Other intervertebral disc degeneration, lumbar region: Secondary | ICD-10-CM

## 2020-04-28 DIAGNOSIS — R6889 Other general symptoms and signs: Secondary | ICD-10-CM | POA: Diagnosis not present

## 2020-04-28 DIAGNOSIS — Z0001 Encounter for general adult medical examination with abnormal findings: Secondary | ICD-10-CM

## 2020-04-28 DIAGNOSIS — Z23 Encounter for immunization: Secondary | ICD-10-CM | POA: Diagnosis not present

## 2020-04-28 DIAGNOSIS — M4316 Spondylolisthesis, lumbar region: Secondary | ICD-10-CM

## 2020-04-28 DIAGNOSIS — E785 Hyperlipidemia, unspecified: Secondary | ICD-10-CM

## 2020-04-28 DIAGNOSIS — I10 Essential (primary) hypertension: Secondary | ICD-10-CM

## 2020-04-28 DIAGNOSIS — E038 Other specified hypothyroidism: Secondary | ICD-10-CM

## 2020-04-28 MED ORDER — UNABLE TO FIND: 0 refills | Status: DC

## 2020-04-28 NOTE — Progress Notes (Signed)
° ° °  [name] [name]     MRN: [id number]      DOB: 1940/09/08  HPI: Patient is in for annual physical exam. Numbness in right thumb and middle finger and locking of left index in the past month C/o difficulty getting a shower due to unsteady` feet, knees, and chronic back pain requests and needs a shower chair Recent labs, are reviewed. Immunization is reviewed , and  Updated   PE: BP (!) 170/70    Pulse (!) 55    Resp 16    Ht 5\' 1"  (1.549 m)    Wt 174 lb 0.6 oz (78.9 kg)    SpO2 98%    BMI 32.88 kg/m   Pleasant  female, alert and oriented x 3, in no cardio-pulmonary distress. Afebrile. HEENT No facial trauma or asymetry. Sinuses non tender.  Extra occullar muscles intact.. External ears normal, . Neck: supple, no adenopathy,JVD or thyromegaly.No bruits.  Chest: Clear to ascultation bilaterally.No crackles or wheezes. Non tender to palpation  Breast: Normal mammogram and asymptomatic. Cardiovascular system; Heart sounds normal,  S1 and  S2 ,no S3.  No murmur, or thrill.  Peripheral pulses normal.  Abdomen: Soft, non tender, No guarding, tenderness or rebound.   .   Musculoskeletal exam: Decreased  ROM of spine, hips , shoulders and knees.  deformity ,swelling and crepitus noted. No muscle wasting or atrophy.   Neurologic: Cranial nerves 2 to 12 intact. Grade 4 power in lower extremities t. No tremor.  Skin: Intact, no ulceration, erythema , scaling or rash noted. Pigmentation normal throughout  Psych; Normal mood and affect. Judgement and concentration normal   Assessment & Plan:  Encounter for annual general medical examination with abnormal findings in adult Annual exam as documented. Counseling done  re healthy lifestyle involving commitment to 150 minutes exercise per week, heart healthy diet, and attaining healthy weight.The importance of adequate sleep also discussed. Regular seat belt use and home safety, is also discussed. Changes in health  habits are decided on by the patient with goals and time frames  set for achieving them. Immunization and cancer screening needs are specifically addressed at this visit.   Lumbar adjacent segment disease with spondylolisthesis Decreased mobility, high fall risk, back and lower extremity pain and weakness, needs and will benefit from shower chair, same requested

## 2020-04-28 NOTE — Assessment & Plan Note (Signed)

## 2020-04-28 NOTE — Assessment & Plan Note (Signed)
Decreased mobility, high fall risk, back and lower extremity pain and weakness, needs and will benefit from shower chair, same requested

## 2020-04-28 NOTE — Patient Instructions (Addendum)
F/U with MD first week in April, call if you need me sooner  Flu vaccine today  Nurse, please document pfizer booster administered 04/21/2020   Fasting lipid, cmp and eGFr and tSH 1 week before April follow up  Be careful  not to fall  Script for shower chair will be sent to  [facility]arm[facility], or [facility](, whichever will deliver , per pt)    Preventing High Cholesterol Cholesterol is a white, waxy substance similar to fat that the human body needs to help build cells. The liver makes all the cholesterol that a person's body needs. Having high cholesterol (hypercholesterolemia) increases a person's risk for heart disease and stroke. Extra (excess) cholesterol comes from the food the person eats. High cholesterol can often be prevented with diet and lifestyle changes. If you already have high cholesterol, you can control it with diet and lifestyle changes and with medicine. How can high cholesterol affect me? If you have high cholesterol, deposits (plaques) may build up on the walls of your arteries. The arteries are the blood vessels that carry blood away from your heart. Plaques make the arteries narrower and stiffer. This can limit or block blood flow and cause blood clots to form. Blood clots:  Are tiny balls of cells that form in your blood.  Can move to the heart or brain, causing a heart attack or stroke. Plaques in arteries greatly increase your risk for heart attack and stroke.Making diet and lifestyle changes can reduce your risk for these conditions that may threaten your life. What can increase my risk? This condition is more likely to develop in people who:  Eat foods that are high in saturated fat or cholesterol. Saturated fat is mostly found in: ? Foods that contain animal fat, such as red meat and some dairy products. ? Certain fatty foods made from plants, such as tropical oils.  Are overweight.  Are not getting enough exercise.  Have a family history of high  cholesterol. What actions can I take to prevent this? Nutrition   Eat less saturated fat.  Avoid trans fats (partially hydrogenated oils). These are often found in margarine and in some baked goods, fried foods, and snacks bought in packages.  Avoid precooked or cured meat, such as sausages or meat loaves.  Avoid foods and drinks that have added sugars.  Eat more fruits, vegetables, and whole grains.  Choose healthy sources of protein, such as fish, poultry, lean cuts of red meat, beans, peas, lentils, and nuts.  Choose healthy sources of fat, such as: ? Nuts. ? Vegetable oils, especially olive oil. ? Fish that have healthy fats (omega-3 fatty acids), such as mackerel or salmon. The items listed above may not be a complete list of recommended foods and beverages. Contact a dietitian for more information. Lifestyle  Lose weight if you are overweight. Losing 5-10 lb (2.3-4.5 kg) can help prevent or control high cholesterol. It can also lower your risk for diabetes and high blood pressure. Ask your health care provider to help you with a diet and exercise plan to lose weight safely.  Do not use any products that contain nicotine or tobacco, such as cigarettes, e-cigarettes, and chewing tobacco. If you need help quitting, ask your health care provider.  Limit your alcohol intake. ? Do not drink alcohol if:  Your health care provider tells you not to drink.  You are pregnant, may be pregnant, or are planning to become pregnant. ? If you drink alcohol:  Limit how much  you use to:  0-1 drink a day for women.  0-2 drinks a day for men.  Be aware of how much alcohol is in your drink. In the [place] one drink equals one 12 oz bottle of beer (355 mL), one 5 oz glass of wine (148 mL), or one 1 oz glass of hard liquor (44 mL). Activity   Get enough exercise. Each week, do at least 150 minutes of exercise that takes a medium level of effort (moderate-intensity exercise). ? This is  exercise that:  Makes your heart beat faster and makes you breathe harder than usual.  Allows you to still be able to talk. ? You could exercise in short sessions several times a day or longer sessions a few times a week. For example, on 5 days each week, you could walk fast or ride your bike 3 times a day for 10 minutes each time.  Do exercises as told by your health care provider. Medicines  In addition to diet and lifestyle changes, your health care provider may recommend medicines to help lower cholesterol. This may be a medicine to lower the amount of cholesterol your liver makes. You may need medicine if: ? Diet and lifestyle changes do not lower your cholesterol enough. ? You have high cholesterol and other risk factors for heart disease or stroke.  Take over-the-counter and prescription medicines only as told by your health care provider. General information  Manage your risk factors for high cholesterol. Talk with your health care provider about all your risk factors and how to lower your risk.  Manage other conditions that you have, such as diabetes or high blood pressure (hypertension).  Have blood tests to check your cholesterol levels at regular points in time as told by your health care provider.  Keep all follow-up visits as told by your health care provider. This is important. Where to find more information  American Heart Association: www.heart.org  National Heart, Lung, and Blood Institute: https://wilson-eaton.com/ Summary  High cholesterol increases your risk for heart disease and stroke. By keeping your cholesterol level low, you can reduce your risk for these conditions.  High cholesterol can often be prevented with diet and lifestyle changes.  Work with your health care provider to manage your risk factors, and have your blood tested regularly. This information is not intended to replace advice given to you by your health care provider. Make sure you discuss any  questions you have with your health care provider. Document Revised: 10/10/2018 Document Reviewed: 02/25/2016 Elsevier Patient Education  2020 [employer].

## 2020-05-02 ENCOUNTER — Telehealth: Payer: Self-pay

## 2020-05-02 ENCOUNTER — Other Ambulatory Visit: Payer: Self-pay

## 2020-05-02 MED ORDER — UNABLE TO FIND
0 refills | Status: DC
Start: 2020-05-02 — End: 2020-05-11

## 2020-05-02 NOTE — Telephone Encounter (Signed)
Order for shower chair resent to [facility]. Patient lvm requesting the order be sent there because [facility] doesn't do the shower chairs

## 2020-05-04 ENCOUNTER — Ambulatory Visit (INDEPENDENT_AMBULATORY_CARE_PROVIDER_SITE_OTHER): Payer: Medicare Other | Admitting: Gastroenterology

## 2020-05-05 ENCOUNTER — Encounter: Payer: Self-pay | Admitting: Family Medicine

## 2020-05-05 DIAGNOSIS — H40053 Ocular hypertension, bilateral: Secondary | ICD-10-CM | POA: Diagnosis not present

## 2020-05-05 DIAGNOSIS — E119 Type 2 diabetes mellitus without complications: Secondary | ICD-10-CM | POA: Diagnosis not present

## 2020-05-05 DIAGNOSIS — H04123 Dry eye syndrome of bilateral lacrimal glands: Secondary | ICD-10-CM | POA: Diagnosis not present

## 2020-05-05 DIAGNOSIS — H16143 Punctate keratitis, bilateral: Secondary | ICD-10-CM | POA: Diagnosis not present

## 2020-05-05 DIAGNOSIS — H16213 Exposure keratoconjunctivitis, bilateral: Secondary | ICD-10-CM | POA: Diagnosis not present

## 2020-05-09 ENCOUNTER — Ambulatory Visit (INDEPENDENT_AMBULATORY_CARE_PROVIDER_SITE_OTHER): Payer: Medicare Other | Admitting: Gastroenterology

## 2020-05-09 ENCOUNTER — Other Ambulatory Visit: Payer: Self-pay

## 2020-05-09 ENCOUNTER — Encounter (INDEPENDENT_AMBULATORY_CARE_PROVIDER_SITE_OTHER): Payer: Self-pay | Admitting: Gastroenterology

## 2020-05-09 VITALS — BP 170/75 | Temp 97.8°F | Ht 61.0 in | Wt 170.2 lb

## 2020-05-09 DIAGNOSIS — D500 Iron deficiency anemia secondary to blood loss (chronic): Secondary | ICD-10-CM | POA: Diagnosis not present

## 2020-05-09 DIAGNOSIS — R6889 Other general symptoms and signs: Secondary | ICD-10-CM | POA: Diagnosis not present

## 2020-05-09 NOTE — Patient Instructions (Signed)
We are checking labs today and will call with recommendations.

## 2020-05-09 NOTE — Progress Notes (Signed)
Patient profile: [name] [name] is a 79 y.o. female seen for follow-up. She was last seen for capsule endoscopy August 2021. She has been followed for history of chronic anemia felt related to GI blood loss. Hemoglobin July 2021 was 7.5. Was 9.4 in April 2021.  History of Present Illness: [name] [name] is seen today for follow-up. She reports generally feeling well. She is typically having 1 to 2 BM a day on iron. She has not noted her stools to be dark in color with iron supplement. She has no obvious blood in stool. She denies any constipation or diarrhea or abdominal pain.  Denies nausea, vomiting, GERD symptoms. She is on omeprazole 20 mg daily. No dysphagia. Appetite good and weight stable.  Non-smoker. No alcohol. Denies taking NSAIDs except baby aspirin. Has some chronic exertional fatigue but denies this being significantly worse recently.  Wt Readings from Last 3 Encounters:  05/09/20 170 lb 3.2 oz (77.2 kg)  04/28/20 174 lb 0.6 oz (78.9 kg)  02/24/20 163 lb (73.9 kg)     Last Colonoscopy: 2018 - Two small polyps in the sigmoid colon and in the proximal transverse colon. Biopsied. - Diverticulosis in the sigmoid colon. Path w/ TA - 5 year repeat    Last Endoscopy: 01/2018- - Normal esophagus. - Z-line irregular, 37 cm from the incisors. - Normal stomach. - Normal duodenal bulb and second portion of the duodenum. - No specimens collected.  Capsule - 02/05/20 - Focal erythema and edema involving antral mucosa without stigmata of bleed. Incomplete evaluation of small bowel as given capsule did not reach cecum. No mucosal abnormalities noted in the small bowel that was examined.  Results reviewed with patient earlier today. Patient advised to resume ferrous sulfate at a dose of 325 mg p.o. twice daily. Patient to have H&H on 02/10/2020. Further recommendations to follow.     Past Medical History:  Past Medical History:  Diagnosis Date  . CAD (coronary artery disease)     Multivessel status post CABG October 2005  . Carotid artery disease ([place])   . CKD (chronic kidney disease), stage III ([place])   . Essential hypertension   . Glaucoma    Laser surgery 2010  . Hyperlipidemia   . Hypothyroidism   . Iron deficiency anemia   . Obesity     Problem List: Patient Active Problem List   Diagnosis Date Noted  . Encounter for annual general medical examination with abnormal findings in adult 04/28/2020  . Gout 08/04/2018  . Hyperuricemia 08/04/2018  . Osteoarthritis of right knee 03/04/2018  . IDA (iron deficiency anemia) 03/04/2018  . CKD (chronic kidney disease) stage 3, GFR 30-59 ml/min (HCC) 02/26/2018  . Carotid stenosis 02/01/2018  . 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 GERD (gastroesophageal reflux disease) 11/10/2013  . Bradycardia, sinus 08/05/2013  . Metabolic syndrome X 16/60/6301  . CAD (coronary artery disease)   . Ejection fraction   . CKD (chronic kidney disease) 07/11/2011  . Hypothyroidism 01/31/2009  . CORONARY ARTERY BYPASS GRAFT, HX OF 01/31/2009  . Carotid artery disease ([place]) 11/30/2008  . Allergic rhinitis 08/12/2008  . Hyperlipidemia LDL goal <70 10/07/2007  . Morbid obesity ([place]) 10/07/2007  . Essential hypertension, malignant 10/07/2007    Past Surgical History: Past Surgical History:  Procedure Laterality Date  . COLONOSCOPY N/A 02/04/2013   Procedure: COLONOSCOPY;  Surgeon: Rogene Houston, MD;  [facility]: [facility];  Service: Endoscopy;  Laterality: N/A;  1015  . COLONOSCOPY  N/A 12/21/2016   Procedure: COLONOSCOPY;  Surgeon: Rogene Houston, MD;  [facility]: [facility];  Service: Endoscopy;  Laterality: N/A;  . CORONARY ARTERY BYPASS GRAFT  October 2005   Dr. Roxy Manns - LIMA to LAD, SVG to OM 2 and OM 3, SVG to PDA  . ESOPHAGOGASTRODUODENOSCOPY N/A 12/21/2016   Procedure: ESOPHAGOGASTRODUODENOSCOPY (EGD);  Surgeon: Rogene Houston, MD;  [facility]: [facility];  Service: Endoscopy;   Laterality: N/A;  11:10  . ESOPHAGOGASTRODUODENOSCOPY N/A 02/27/2018   Procedure: ESOPHAGOGASTRODUODENOSCOPY (EGD);  Surgeon: Rogene Houston, MD;  [facility];  Service: Endoscopy;  Laterality: N/A;  . EYE SURGERY  2010   laser treatment to both eyes for glaucoma  . GIVENS CAPSULE STUDY N/A 03/07/2018   Procedure: GIVENS CAPSULE STUDY;  Surgeon: Rogene Houston, MD;  [facility];  Service: Endoscopy;  Laterality: N/A;  . GIVENS CAPSULE STUDY N/A 02/05/2020   Procedure: GIVENS CAPSULE STUDY;  Surgeon: Rogene Houston, MD;  [facility]: [facility];  Service: Endoscopy;  Laterality: N/A;  730    Allergies: Allergies  Allergen Reactions  . Tramadol Itching  . Lisinopril Cough  . Clonidine Derivatives Other (See Comments)    Zombie BP dropped      Home Medications:  Current Outpatient Medications:  .  acetaminophen (TYLENOL) 500 MG tablet, Take 1,000 mg by mouth every 6 (six) hours as needed for mild pain or headache. , Disp: , Rfl:  .  amLODipine (NORVASC) 5 MG tablet, TAKE ONE TABLET BY MOUTH DAILY., Disp: 90 tablet, Rfl: 1 .  aspirin 81 MG tablet, Take 1 tablet (81 mg total) by mouth daily. Restart on 03/02/18, Disp: 30 tablet, Rfl: 0 .  cetirizine (ZYRTEC) 10 MG tablet, TAKE ONE TABLET BY MOUTH DAILY., Disp: 90 tablet, Rfl: 1 .  Cholecalciferol (VITAMIN D3) 1000 UNITS CAPS, Take 1 capsule by mouth daily., Disp: , Rfl:  .  ferrous sulfate 325 (65 FE) MG tablet, Take 1 tablet (325 mg total) by mouth 2 (two) times daily with a meal., Disp: , Rfl: 3 .  fish oil-omega-3 fatty acids 1000 MG capsule, Take 1,200 mg by mouth daily. , Disp: , Rfl:  .  fluticasone (FLONASE) 50 MCG/ACT nasal spray, USE 2 SPRAYS IN EACH NOSTRIL ONCE DAILY.  SHAKE GENTLY BEFORE USING. (Patient taking differently: Place 2 sprays into both nostrils daily. ), Disp: 48 g, Rfl: 1 .  hydrALAZINE (APRESOLINE) 50 MG tablet, TAKE ONE TABLET BY MOUTH THREE TIMES DAILY., Disp: 90 tablet, Rfl: 3 .   latanoprost (XALATAN) 0.005 % ophthalmic solution, Place 1 drop into both eyes at bedtime. One drop each eye at bedtime , Disp: , Rfl:  .  levothyroxine (SYNTHROID) 100 MCG tablet, Take 1 tablet (100 mcg total) by mouth daily., Disp: 90 tablet, Rfl: 3 .  metoprolol succinate (TOPROL-XL) 100 MG 24 hr tablet, TAKE ONE TABLET BY MOUTH DAILY. TAKE WITH OR IMMEDIATELY FOLLOWING A MEAL. (Patient taking differently: Take 100 mg by mouth daily. TAKE ONE TABLET BY MOUTH DAILY. TAKE WITH OR IMMEDIATELY FOLLOWING A MEAL.), Disp: 90 tablet, Rfl: 1 .  Misc. Devices (WALL GRAB BAR) MISC, Grab bar for shower x 2 DX high fall risk, Disp: 2 each, Rfl: 0 .  nitroGLYCERIN (NITROSTAT) 0.4 MG SL tablet, DISSOLVE ONE TABLET UNDER TONGUE EVERY 5 MINUTES UP TO 3 DOSES AS NEEDED FOR CHEST PAIN., Disp: 25 tablet, Rfl: 3 .  olmesartan (BENICAR) 5 MG tablet, Take 5 mg by mouth daily.,  Disp: , Rfl:  .  omeprazole (PRILOSEC) 20 MG capsule, Take 1 capsule (20 mg total) by mouth daily., Disp: 90 capsule, Rfl: 3 .  rosuvastatin (CRESTOR) 10 MG tablet, TAKE ONE TABLET BY MOUTH DAILY. (Patient taking differently: Take 10 mg by mouth daily. ), Disp: 90 tablet, Rfl: 3 .  UNABLE TO FIND, Shower chair x 1  DX M54.36, Disp: 1 each, Rfl: 0   Family History: family history includes Aneurysm in her mother; Breast cancer (age of onset: 64) in her sister; COPD in her sister; Colon cancer (age of onset: 13) in her brother; Coronary artery disease in her father, mother, and sister; Diabetes in her sister, sister, and son; Diverticulosis in her sister; Hearing loss in her sister; Heart Problems in her sister; Hypertension in her mother and sister; Kidney disease in her sister; Liver cancer (age of onset: 16) in her brother; Pancreatic cancer in her son.    Social History:   reports that she quit smoking about 16 years ago. Her smoking use included cigarettes. She has a 10.00 pack-year smoking history. She has never used smokeless tobacco. She  reports that she does not drink alcohol and does not use drugs.   Review of Systems: Constitutional: Denies weight loss/weight gain  Eyes: No changes in vision. ENT: No oral lesions, sore throat.  GI: see HPI.  Heme/Lymph: No easy bruising.  CV: No chest pain.  GU: No hematuria.  Integumentary: No rashes.  Neuro: No headaches.  Psych: No depression/anxiety.  Endocrine: No heat/cold intolerance.  Allergic/Immunologic: No urticaria.  Resp: No cough, SOB.  Musculoskeletal: No joint swelling.    Physical Examination: BP (!) 170/75 (BP Location: Left Arm, Patient Position: Sitting, Cuff Size: Normal)   Temp 97.8 F (36.6 C) (Oral)   Ht 5\' 1"  (1.549 m)   Wt 170 lb 3.2 oz (77.2 kg)   BMI 32.16 kg/m  Gen: NAD, alert and oriented x 4 Reports history of whitecoat hypertension and states blood pressures been normal plan. HEENT: PEERLA, EOMI, Neck: supple, no JVD Chest: CTA bilaterally, no wheezes, crackles, or other adventitious sounds CV: RRR, no m/g/c/r Abd: soft, NT, ND, +BS in all four quadrants; no HSM, guarding, ridigity, or rebound tenderness Ext: no edema, well perfused with 2+ pulses, Skin: no rash or lesions noted on observed skin Lymph: no noted LAD  Data Reviewed:   August 2021-hemoglobin 9, hematocrit 27.07 January 2020-ferritin 14, iron 20, low percent sat 5%. Normal B12 and folate   Care Everywhere labs - 04/14/20-hemoglobin 8.1, MCV 90, platelets 193. Iron 45, ferritin 19.8  Assessment/Plan: Ms. Hascall is a 79 y.o. female    [name] [name] was seen today for iron deficiency anemia due to chronic blood loss.  Diagnoses and all orders for this visit:  Iron deficiency anemia due to chronic blood loss -     Fe+TIBC+Fer -     CBC with Differential    1. Iron deficiency anemia-felt related to chronic GI blood loss. Colonoscopy 2018, EGD 2019, and capsul 12/2019 (incomplete to cecum) as above. She has not seen any blood in stools or dark stools. She is on iron once a day.  No severe worsening of chronic exertional fatigue. Last labs show hemoglobin dropped to 8.1 about 4 weeks ago. We will repeat today, further recommendations pending. She is currently on oral iron once a day. She has no GI symptoms.  *will discuss care w/ Dr Laural Golden when labs return and contact patient w/ updated plan.   2. GERD -  asymptomatic on low dose PPI  3. Hx colon polyps - due 2023  I personally performed the service, non-incident to. (WP)  [name] [name], [facility]

## 2020-05-10 LAB — CBC WITH DIFFERENTIAL/PLATELET
Absolute Monocytes: 410 cells/uL (ref 200–950)
Basophils Absolute: 49 cells/uL (ref 0–200)
Basophils Relative: 0.9 %
Eosinophils Absolute: 200 cells/uL (ref 15–500)
Eosinophils Relative: 3.7 %
HCT: 26.5 % — ABNORMAL LOW (ref 35.0–45.0)
Hemoglobin: 8.4 g/dL — ABNORMAL LOW (ref 11.7–15.5)
Lymphs Abs: 1312 cells/uL (ref 850–3900)
MCH: 29 pg (ref 27.0–33.0)
MCHC: 31.7 g/dL — ABNORMAL LOW (ref 32.0–36.0)
MCV: 91.4 fL (ref 80.0–100.0)
MPV: 12.1 fL (ref 7.5–12.5)
Monocytes Relative: 7.6 %
Neutro Abs: 3429 cells/uL (ref 1500–7800)
Neutrophils Relative %: 63.5 %
Platelets: 200 10*3/uL (ref 140–400)
RBC: 2.9 10*6/uL — ABNORMAL LOW (ref 3.80–5.10)
RDW: 13.2 % (ref 11.0–15.0)
Total Lymphocyte: 24.3 %
WBC: 5.4 10*3/uL (ref 3.8–10.8)

## 2020-05-10 LAB — IRON,TIBC AND FERRITIN PANEL
%SAT: 7 % (calc) — ABNORMAL LOW (ref 16–45)
Ferritin: 25 ng/mL (ref 16–288)
Iron: 27 ug/dL — ABNORMAL LOW (ref 45–160)
TIBC: 391 mcg/dL (calc) (ref 250–450)

## 2020-05-11 ENCOUNTER — Other Ambulatory Visit: Payer: Self-pay

## 2020-05-11 MED ORDER — UNABLE TO FIND: 0 refills | Status: AC

## 2020-05-16 ENCOUNTER — Other Ambulatory Visit (INDEPENDENT_AMBULATORY_CARE_PROVIDER_SITE_OTHER): Payer: Self-pay | Admitting: *Deleted

## 2020-05-16 DIAGNOSIS — D500 Iron deficiency anemia secondary to blood loss (chronic): Secondary | ICD-10-CM

## 2020-05-23 ENCOUNTER — Other Ambulatory Visit (INDEPENDENT_AMBULATORY_CARE_PROVIDER_SITE_OTHER): Payer: Self-pay | Admitting: *Deleted

## 2020-05-23 DIAGNOSIS — D500 Iron deficiency anemia secondary to blood loss (chronic): Secondary | ICD-10-CM

## 2020-06-02 DIAGNOSIS — R809 Proteinuria, unspecified: Secondary | ICD-10-CM | POA: Diagnosis not present

## 2020-06-02 DIAGNOSIS — N189 Chronic kidney disease, unspecified: Secondary | ICD-10-CM | POA: Diagnosis not present

## 2020-06-02 DIAGNOSIS — N1832 Chronic kidney disease, stage 3b: Secondary | ICD-10-CM | POA: Diagnosis not present

## 2020-06-02 DIAGNOSIS — D631 Anemia in chronic kidney disease: Secondary | ICD-10-CM | POA: Diagnosis not present

## 2020-06-02 DIAGNOSIS — I129 Hypertensive chronic kidney disease with stage 1 through stage 4 chronic kidney disease, or unspecified chronic kidney disease: Secondary | ICD-10-CM | POA: Diagnosis not present

## 2020-06-09 ENCOUNTER — Other Ambulatory Visit: Payer: Self-pay | Admitting: Family Medicine

## 2020-06-15 DIAGNOSIS — N1832 Chronic kidney disease, stage 3b: Secondary | ICD-10-CM | POA: Diagnosis not present

## 2020-06-15 DIAGNOSIS — E875 Hyperkalemia: Secondary | ICD-10-CM | POA: Diagnosis not present

## 2020-06-15 DIAGNOSIS — I129 Hypertensive chronic kidney disease with stage 1 through stage 4 chronic kidney disease, or unspecified chronic kidney disease: Secondary | ICD-10-CM | POA: Diagnosis not present

## 2020-06-15 DIAGNOSIS — R809 Proteinuria, unspecified: Secondary | ICD-10-CM | POA: Diagnosis not present

## 2020-06-27 ENCOUNTER — Other Ambulatory Visit: Payer: Self-pay | Admitting: Family Medicine

## 2020-08-03 ENCOUNTER — Encounter (INDEPENDENT_AMBULATORY_CARE_PROVIDER_SITE_OTHER): Payer: Self-pay

## 2020-08-12 DIAGNOSIS — N1832 Chronic kidney disease, stage 3b: Secondary | ICD-10-CM | POA: Diagnosis not present

## 2020-08-12 DIAGNOSIS — I129 Hypertensive chronic kidney disease with stage 1 through stage 4 chronic kidney disease, or unspecified chronic kidney disease: Secondary | ICD-10-CM | POA: Diagnosis not present

## 2020-08-12 DIAGNOSIS — R809 Proteinuria, unspecified: Secondary | ICD-10-CM | POA: Diagnosis not present

## 2020-08-12 DIAGNOSIS — E875 Hyperkalemia: Secondary | ICD-10-CM | POA: Diagnosis not present

## 2020-08-12 DIAGNOSIS — D631 Anemia in chronic kidney disease: Secondary | ICD-10-CM | POA: Diagnosis not present

## 2020-08-12 DIAGNOSIS — N189 Chronic kidney disease, unspecified: Secondary | ICD-10-CM | POA: Diagnosis not present

## 2020-08-18 DIAGNOSIS — E875 Hyperkalemia: Secondary | ICD-10-CM | POA: Diagnosis not present

## 2020-08-18 DIAGNOSIS — R809 Proteinuria, unspecified: Secondary | ICD-10-CM | POA: Diagnosis not present

## 2020-08-18 DIAGNOSIS — N1832 Chronic kidney disease, stage 3b: Secondary | ICD-10-CM | POA: Diagnosis not present

## 2020-08-18 DIAGNOSIS — N189 Chronic kidney disease, unspecified: Secondary | ICD-10-CM | POA: Diagnosis not present

## 2020-08-18 DIAGNOSIS — I129 Hypertensive chronic kidney disease with stage 1 through stage 4 chronic kidney disease, or unspecified chronic kidney disease: Secondary | ICD-10-CM | POA: Diagnosis not present

## 2020-08-18 DIAGNOSIS — D631 Anemia in chronic kidney disease: Secondary | ICD-10-CM | POA: Diagnosis not present

## 2020-08-18 DIAGNOSIS — R6889 Other general symptoms and signs: Secondary | ICD-10-CM | POA: Diagnosis not present

## 2020-08-18 DIAGNOSIS — I5032 Chronic diastolic (congestive) heart failure: Secondary | ICD-10-CM | POA: Diagnosis not present

## 2020-08-29 ENCOUNTER — Other Ambulatory Visit: Payer: Self-pay | Admitting: Family Medicine

## 2020-08-31 ENCOUNTER — Ambulatory Visit: Payer: Medicare Other | Admitting: Cardiology

## 2020-09-26 ENCOUNTER — Other Ambulatory Visit (INDEPENDENT_AMBULATORY_CARE_PROVIDER_SITE_OTHER): Payer: Self-pay | Admitting: Internal Medicine

## 2020-09-26 ENCOUNTER — Other Ambulatory Visit: Payer: Self-pay | Admitting: Family Medicine

## 2020-09-28 ENCOUNTER — Ambulatory Visit (INDEPENDENT_AMBULATORY_CARE_PROVIDER_SITE_OTHER): Payer: Medicare Other

## 2020-09-28 ENCOUNTER — Other Ambulatory Visit: Payer: Self-pay | Admitting: Cardiology

## 2020-09-28 DIAGNOSIS — I6523 Occlusion and stenosis of bilateral carotid arteries: Secondary | ICD-10-CM

## 2020-09-29 ENCOUNTER — Telehealth: Payer: Self-pay | Admitting: *Deleted

## 2020-09-29 NOTE — Telephone Encounter (Signed)
-----   Message from Satira Sark, MD sent at 09/28/2020  2:46 PM EDT ----- Results reviewed.  Moderate bilateral ICA stenosis.  Continue medical therapy and recommend follow-up carotid Dopplers in 1 year.

## 2020-09-29 NOTE — Telephone Encounter (Signed)
Patient informed and verbalized understanding of plan. Copy sent to PCP 

## 2020-09-30 NOTE — Progress Notes (Signed)
Cardiology Office Note  Date: 10/03/2020   ID: [name] [name], [name] [name] 02-16-1941, MRN [id number]  PCP:  Fayrene Helper, MD  Cardiologist:  Rozann Lesches, MD Electrophysiologist:  None   Chief Complaint  Patient presents with  . Cardiac follow-up    History of Present Illness: [name] [name] is a 80 y.o. female last assessed via telehealth encounter in August 2021.  She presents for a follow-up visit.  Reports no palpitations or chest discomfort with typical activities.  We went over her medications which are listed below.  Hydralazine has been uptitrated, she continues on Norvasc.  She was also on olmesartan per nephrology, however stopped it due to recurring leg swelling which resolved when she came off of the medication.  Recent follow-up carotid Dopplers revealed 40 to 59% bilateral ICA stenoses.  She remains asymptomatic and continues on aspirin as well as Crestor.   Past Medical History:  Diagnosis Date  . 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 Carotid artery disease ([place])   . CKD (chronic kidney disease), stage III ([place])   . Essential hypertension   . Glaucoma    Laser surgery 2010  . Hyperlipidemia   . Hypothyroidism   . Iron deficiency anemia   . Obesity     Past Surgical History:  Procedure Laterality Date  . COLONOSCOPY N/A 02/04/2013   Procedure: COLONOSCOPY;  Surgeon: Rogene Houston, MD;  [facility];  Service: Endoscopy;  Laterality: N/A;  1015  . COLONOSCOPY N/A 12/21/2016   Procedure: COLONOSCOPY;  Surgeon: Rogene Houston, MD;  [facility];  Service: Endoscopy;  Laterality: N/A;  . CORONARY ARTERY BYPASS GRAFT  October 2005   Dr. Roxy Manns - LIMA to LAD, SVG to OM 2 and OM 3, SVG to PDA  . ESOPHAGOGASTRODUODENOSCOPY N/A 12/21/2016   Procedure: ESOPHAGOGASTRODUODENOSCOPY (EGD);  Surgeon: Rogene Houston, MD;  [facility];  Service: Endoscopy;  Laterality: N/A;  11:10  .  ESOPHAGOGASTRODUODENOSCOPY N/A 02/27/2018   Procedure: ESOPHAGOGASTRODUODENOSCOPY (EGD);  Surgeon: Rogene Houston, MD;  [facility];  Service: Endoscopy;  Laterality: N/A;  . EYE SURGERY  2010   laser treatment to both eyes for glaucoma  . GIVENS CAPSULE STUDY N/A 03/07/2018   Procedure: GIVENS CAPSULE STUDY;  Surgeon: Rogene Houston, MD;  [facility];  Service: Endoscopy;  Laterality: N/A;  . GIVENS CAPSULE STUDY N/A 02/05/2020   Procedure: GIVENS CAPSULE STUDY;  Surgeon: Rogene Houston, MD;  [facility];  Service: Endoscopy;  Laterality: N/A;  730    Current Outpatient Medications  Medication Sig Dispense Refill  . acetaminophen (TYLENOL) 500 MG tablet Take 1,000 mg by mouth every 6 (six) hours as needed for mild pain or headache.     Marland Kitchen amLODipine (NORVASC) 5 MG tablet TAKE ONE TABLET BY MOUTH DAILY. 90 tablet 1  . aspirin 81 MG tablet Take 1 tablet (81 mg total) by mouth daily. Restart on 03/02/18 30 tablet 0  . cetirizine (ZYRTEC) 10 MG tablet TAKE ONE TABLET BY MOUTH DAILY. 90 tablet 1  . Cholecalciferol (VITAMIN D3) 1000 UNITS CAPS Take 1 capsule by mouth daily.    . ferrous sulfate 325 (65 FE) MG tablet Take 1 tablet (325 mg total) by mouth 2 (two) times daily with a meal.  3  . fish oil-omega-3 fatty acids 1000 MG capsule Take 1,200 mg by mouth daily.     Marland Kitchen  fluticasone (FLONASE) 50 MCG/ACT nasal spray USE 2 SPRAYS IN EACH NOSTRIL ONCE DAILY.  SHAKE GENTLY BEFORE USING. (Patient taking differently: Place 2 sprays into both nostrils daily.) 48 g 1  . hydrALAZINE (APRESOLINE) 50 MG tablet TAKE ONE TABLET BY MOUTH THREE TIMES DAILY. (Patient taking differently: 100 mg 2 (two) times daily.) 90 tablet 3  . latanoprost (XALATAN) 0.005 % ophthalmic solution Place 1 drop into both eyes at bedtime. One drop each eye at bedtime    . levothyroxine (SYNTHROID) 100 MCG tablet Take 1 tablet (100 mcg total) by mouth daily. 90 tablet 3  . metoprolol succinate  (TOPROL-XL) 100 MG 24 hr tablet TAKE ONE TABLET BY MOUTH DAILY. TAKE WITH OR IMMEDIATELY FOLLOWING A MEAL. 90 tablet 1  . Misc. Devices (WALL GRAB BAR) MISC Grab bar for shower x 2 DX high fall risk 2 each 0  . nitroGLYCERIN (NITROSTAT) 0.4 MG SL tablet DISSOLVE ONE TABLET UNDER TONGUE EVERY 5 MINUTES UP TO 3 DOSES AS NEEDED FOR CHEST PAIN. 25 tablet 3  . olmesartan (BENICAR) 5 MG tablet Take 5 mg by mouth daily.    Marland Kitchen omeprazole (PRILOSEC) 20 MG capsule TAKE ONE CAPSULE BY MOUTH ONCE DAILY 90 capsule 3  . rosuvastatin (CRESTOR) 10 MG tablet TAKE ONE TABLET BY MOUTH DAILY. (Patient taking differently: Take 10 mg by mouth daily.) 90 tablet 3  . UNABLE TO FIND Shower chair x 1  DX M54.36 1 each 0   No current facility-administered medications for this visit.   Allergies:  Tramadol, Lisinopril, and Clonidine derivatives   ROS: No dizziness or syncope.  Physical Exam: VS:  BP 130/70   Pulse (!) 56   Ht 5\' 1"  (1.549 m)   Wt 170 lb (77.1 kg)   SpO2 97%   BMI 32.12 kg/m , BMI Body mass index is 32.12 kg/m.  Wt Readings from Last 3 Encounters:  10/03/20 170 lb (77.1 kg)  05/09/20 170 lb 3.2 oz (77.2 kg)  04/28/20 174 lb 0.6 oz (78.9 kg)    General: Patient appears comfortable at rest. HEENT: Conjunctiva and lids normal, wearing a mask. Neck: Supple, no elevated JVP, bilateral carotid bruits, no thyromegaly. Lungs: Clear to auscultation, nonlabored breathing at rest. Cardiac: Regular rate and rhythm, no S3, 3/6 basal systolic murmur, no pericardial rub. Extremities: No pitting edema.  ECG:  An ECG dated 01/19/2020 was personally reviewed today and demonstrated:  Probable sinus bradycardia with LVH and diffuse repolarization abnormalities.  Recent Labwork: 01/18/2020: TSH 8.93 01/19/2020: ALT 17; AST 22; BUN 22; Creatinine, Ser 1.68; Potassium 4.4; Sodium 136 05/09/2020: Hemoglobin 8.4; Platelets 200     Component Value Date/Time   CHOL 133 07/21/2019 0938   TRIG 118 07/21/2019 0938    HDL 52 07/21/2019 0938   CHOLHDL 2.6 07/21/2019 0938   VLDL 19 12/04/2016 1137   LDLCALC 61 07/21/2019 0938    Other Studies Reviewed Today:  Echocardiogram 06/03/2019: 1. Left ventricular ejection fraction, by visual estimation, is 60 to  65%. The left ventricle has normal function. There is borderline left  ventricular hypertrophy.  2. Elevated left ventricular end-diastolic pressure.  3. Left ventricular diastolic parameters are consistent with Grade II  diastolic dysfunction (pseudonormalization).  4. The left ventricle has no regional wall motion abnormalities.  5. Global right ventricle has normal systolic function.The right  ventricular size is normal. No increase in right ventricular wall  thickness.  6. Left atrial size was severely dilated.  7. Right atrial size was mildly dilated.  8. Small pericardial effusion.  9. Mild aortic valve annular calcification.  10. Mild mitral annular calcification.  11. The mitral valve is grossly normal. Mild mitral valve regurgitation.  12. The tricuspid valve is grossly normal. Tricuspid valve regurgitation  mild-moderate.  13. The aortic valve is tricuspid. Aortic valve regurgitation is not  visualized.  14. The pulmonic valve was grossly normal. Pulmonic valve regurgitation is  trivial.  15. Mildly elevated pulmonary artery systolic pressure.  16. The inferior vena cava is normal in size with greater than 50%  respiratory variability, suggesting right atrial pressure of 3 mmHg.   Carotid Dopplers 09/28/2020: Summary:  Right Carotid: Velocities in the right ICA are consistent with a 40-59%         stenosis.   Left Carotid: Velocities in the left ICA are consistent with a 40-59%  stenosis.        The ECA appears >50% stenosed.   Vertebrals: Bilateral vertebral arteries demonstrate antegrade flow.  Subclavians: Left subclavian artery was stenotic. Normal flow hemodynamics  were        seen in  the right subclavian artery.   *See table(s) above for measurements and observations.  Suggest follow up study in 12 months.   Assessment and Plan:  1.  Asymptomatic, bilateral carotid artery disease, moderate by recent Dopplers.  Continue aspirin and Crestor.  2.  Essential hypertension, continue Norvasc and hydralazine.  She did not tolerate olmesartan as discussed above.  3.  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 Theador Hawthorne.  4.  Multivessel CAD status post CABG in 2005.  She reports no active angina and remains on aspirin, Norvasc, Toprol-XL, and Crestor.  Medication Adjustments/Labs and Tests Ordered: Current medicines are reviewed at length with the patient today.  Concerns regarding medicines are outlined above.   Tests Ordered: Orders Placed This Encounter  Procedures  . ECHOCARDIOGRAM COMPLETE    Medication Changes: No orders of the defined types were placed in this encounter.   Disposition:  Follow up 6 months.  Signed, Satira Sark, MD, [facility] 10/03/2020 11:54 AM    [facility] [place], [place], [place] [address] Phone: [phone]; Fax: [phone]

## 2020-10-03 ENCOUNTER — Other Ambulatory Visit: Payer: Self-pay

## 2020-10-03 ENCOUNTER — Encounter: Payer: Self-pay | Admitting: Cardiology

## 2020-10-03 ENCOUNTER — Ambulatory Visit: Payer: Medicare Other | Admitting: Cardiology

## 2020-10-03 VITALS — BP 130/70 | HR 56 | Ht 61.0 in | Wt 170.0 lb

## 2020-10-03 DIAGNOSIS — I6523 Occlusion and stenosis of bilateral carotid arteries: Secondary | ICD-10-CM

## 2020-10-03 DIAGNOSIS — I10 Essential (primary) hypertension: Secondary | ICD-10-CM | POA: Diagnosis not present

## 2020-10-03 DIAGNOSIS — N1832 Chronic kidney disease, stage 3b: Secondary | ICD-10-CM

## 2020-10-03 DIAGNOSIS — R011 Cardiac murmur, unspecified: Secondary | ICD-10-CM | POA: Diagnosis not present

## 2020-10-03 DIAGNOSIS — I25119 Atherosclerotic heart disease of native coronary artery with unspecified angina pectoris: Secondary | ICD-10-CM | POA: Diagnosis not present

## 2020-10-03 NOTE — Patient Instructions (Signed)
Your physician recommends that you schedule a follow-up appointment in: 6 MONTHS WITH DR MCDOWELL  Your physician recommends that you continue on your current medications as directed. Please refer to the Current Medication list given to you today.  Your physician has requested that you have an echocardiogram. Echocardiography is a painless test that uses sound waves to create images of your heart. It provides your doctor with information about the size and shape of your heart and how well your heart's chambers and valves are working. This procedure takes approximately one hour. There are no restrictions for this procedure.  Thank you for choosing [facility]!!    

## 2020-10-06 ENCOUNTER — Other Ambulatory Visit: Payer: Medicare Other

## 2020-10-10 ENCOUNTER — Other Ambulatory Visit: Payer: Self-pay | Admitting: Family Medicine

## 2020-10-19 DIAGNOSIS — N1832 Chronic kidney disease, stage 3b: Secondary | ICD-10-CM | POA: Diagnosis not present

## 2020-10-19 DIAGNOSIS — I5032 Chronic diastolic (congestive) heart failure: Secondary | ICD-10-CM | POA: Diagnosis not present

## 2020-10-19 DIAGNOSIS — Z79899 Other long term (current) drug therapy: Secondary | ICD-10-CM | POA: Diagnosis not present

## 2020-10-19 DIAGNOSIS — R809 Proteinuria, unspecified: Secondary | ICD-10-CM | POA: Diagnosis not present

## 2020-10-19 DIAGNOSIS — D631 Anemia in chronic kidney disease: Secondary | ICD-10-CM | POA: Diagnosis not present

## 2020-10-19 DIAGNOSIS — I10 Essential (primary) hypertension: Secondary | ICD-10-CM | POA: Diagnosis not present

## 2020-10-19 DIAGNOSIS — E785 Hyperlipidemia, unspecified: Secondary | ICD-10-CM | POA: Diagnosis not present

## 2020-10-20 DIAGNOSIS — D508 Other iron deficiency anemias: Secondary | ICD-10-CM | POA: Diagnosis not present

## 2020-10-20 DIAGNOSIS — N189 Chronic kidney disease, unspecified: Secondary | ICD-10-CM | POA: Diagnosis not present

## 2020-10-20 DIAGNOSIS — N1832 Chronic kidney disease, stage 3b: Secondary | ICD-10-CM | POA: Diagnosis not present

## 2020-10-20 DIAGNOSIS — I5032 Chronic diastolic (congestive) heart failure: Secondary | ICD-10-CM | POA: Diagnosis not present

## 2020-10-20 DIAGNOSIS — D631 Anemia in chronic kidney disease: Secondary | ICD-10-CM | POA: Diagnosis not present

## 2020-10-20 LAB — CMP14+EGFR
ALT: 10 IU/L (ref 0–32)
AST: 20 IU/L (ref 0–40)
Albumin/Globulin Ratio: 1.5 (ref 1.2–2.2)
Albumin: 4.4 g/dL (ref 3.7–4.7)
Alkaline Phosphatase: 113 IU/L (ref 44–121)
BUN/Creatinine Ratio: 19 (ref 12–28)
BUN: 31 mg/dL — ABNORMAL HIGH (ref 8–27)
Bilirubin Total: 0.3 mg/dL (ref 0.0–1.2)
CO2: 18 mmol/L — ABNORMAL LOW (ref 20–29)
Calcium: 10.5 mg/dL — ABNORMAL HIGH (ref 8.7–10.3)
Chloride: 105 mmol/L (ref 96–106)
Creatinine, Ser: 1.59 mg/dL — ABNORMAL HIGH (ref 0.57–1.00)
Globulin, Total: 2.9 g/dL (ref 1.5–4.5)
Glucose: 86 mg/dL (ref 65–99)
Potassium: 4.6 mmol/L (ref 3.5–5.2)
Sodium: 141 mmol/L (ref 134–144)
Total Protein: 7.3 g/dL (ref 6.0–8.5)
eGFR: 33 mL/min/{1.73_m2} — ABNORMAL LOW (ref >59)

## 2020-10-20 LAB — LIPID PANEL
Chol/HDL Ratio: 2.3 ratio (ref 0.0–4.4)
Cholesterol, Total: 130 mg/dL (ref 100–199)
HDL: 57 mg/dL (ref >39)
LDL Chol Calc (NIH): 54 mg/dL (ref 0–99)
Triglycerides: 106 mg/dL (ref 0–149)
VLDL Cholesterol Cal: 19 mg/dL (ref 5–40)

## 2020-10-20 LAB — TSH: TSH: 6.4 u[IU]/mL — ABNORMAL HIGH (ref 0.450–4.500)

## 2020-10-25 ENCOUNTER — Ambulatory Visit (INDEPENDENT_AMBULATORY_CARE_PROVIDER_SITE_OTHER): Payer: Medicare Other

## 2020-10-25 DIAGNOSIS — R011 Cardiac murmur, unspecified: Secondary | ICD-10-CM

## 2020-10-25 LAB — ECHOCARDIOGRAM COMPLETE
AR max vel: 1.57 cm2
AV Area VTI: 1.71 cm2
AV Area mean vel: 1.69 cm2
AV Mean grad: 12 mmHg
AV Peak grad: 28.1 mmHg
AV Vena cont: 0.34 cm
Ao pk vel: 2.65 m/s
Area-P 1/2: 4.49 cm2
Calc EF: 64.7 %
MV M vel: 2.69 m/s
MV Peak grad: 29 mmHg
P 1/2 time: 1987 msec
S' Lateral: 2.24 cm
Single Plane A2C EF: 62.9 %
Single Plane A4C EF: 64.4 %

## 2020-10-26 ENCOUNTER — Encounter: Payer: Self-pay | Admitting: Family Medicine

## 2020-10-26 ENCOUNTER — Ambulatory Visit (INDEPENDENT_AMBULATORY_CARE_PROVIDER_SITE_OTHER): Payer: Medicare Other | Admitting: Family Medicine

## 2020-10-26 ENCOUNTER — Other Ambulatory Visit: Payer: Self-pay

## 2020-10-26 VITALS — BP 138/80 | HR 63 | Temp 98.1°F | Ht 61.0 in | Wt 168.0 lb

## 2020-10-26 DIAGNOSIS — Z1231 Encounter for screening mammogram for malignant neoplasm of breast: Secondary | ICD-10-CM

## 2020-10-26 DIAGNOSIS — R200 Anesthesia of skin: Secondary | ICD-10-CM

## 2020-10-26 DIAGNOSIS — E785 Hyperlipidemia, unspecified: Secondary | ICD-10-CM | POA: Diagnosis not present

## 2020-10-26 DIAGNOSIS — E038 Other specified hypothyroidism: Secondary | ICD-10-CM | POA: Diagnosis not present

## 2020-10-26 DIAGNOSIS — M1A9XX1 Chronic gout, unspecified, with tophus (tophi): Secondary | ICD-10-CM

## 2020-10-26 DIAGNOSIS — E669 Obesity, unspecified: Secondary | ICD-10-CM

## 2020-10-26 DIAGNOSIS — M79641 Pain in right hand: Secondary | ICD-10-CM | POA: Insufficient documentation

## 2020-10-26 DIAGNOSIS — K219 Gastro-esophageal reflux disease without esophagitis: Secondary | ICD-10-CM

## 2020-10-26 DIAGNOSIS — I10 Essential (primary) hypertension: Secondary | ICD-10-CM

## 2020-10-26 MED ORDER — LEVOTHYROXINE SODIUM 125 MCG PO TABS: 125.0000 ug | ORAL_TABLET | Freq: Every day | ORAL | 1 refills | Status: DC

## 2020-10-26 NOTE — Patient Instructions (Signed)
F/U in office in 12 weeks, call, if you need me sooner  You are referred to Orthopedics in [place] re right finger numbness and right hand weakness  New higher dose of synthroid is 125 mcg once daily  Rept TSH in 11 weeks  Please schedule mammogram at checkout  Careful not to fall.  [employer] handicap sticker form filled out  Thanks for choosing [facility], we consider it a privelige to serve you.

## 2020-10-27 ENCOUNTER — Telehealth: Payer: Self-pay | Admitting: *Deleted

## 2020-10-27 NOTE — Telephone Encounter (Signed)
Patient informed. Copy sent to PCP °

## 2020-10-27 NOTE — Telephone Encounter (Signed)
-----   Message from [name] Sark, MD sent at 10/25/2020  7:54 PM EDT ----- Results reviewed.  LVEF vigorous at 65 to 70% with moderate diastolic dysfunction and elevated pulmonary artery pressures.  She was clinically stable at recent visit however.  Aortic valve calcified with evidence of mild stenosis, unlikely to be symptomatic at this point.  Continue with current medications and follow-up plan.

## 2020-10-28 ENCOUNTER — Encounter: Payer: Self-pay | Admitting: Family Medicine

## 2020-10-28 NOTE — Assessment & Plan Note (Signed)
Increased , debilitating pain x 2 months, refer to Ortho

## 2020-10-28 NOTE — Assessment & Plan Note (Signed)
Stable, no recent flare

## 2020-10-28 NOTE — Assessment & Plan Note (Signed)
Controlled, no change in medication  

## 2020-10-28 NOTE — Assessment & Plan Note (Signed)
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

## 2020-10-28 NOTE — Assessment & Plan Note (Signed)
Controlled, no change in medication DASH diet and commitment to daily physical activity for a minimum of 30 minutes discussed and encouraged, as a part of hypertension management. The importance of attaining a healthy weight is also discussed.  BP/Weight 10/26/2020 10/03/2020 05/09/2020 04/28/2020 02/24/2020 01/07/2177 09/05/5421  Systolic BP 702 301 720 910 681 - 661  Diastolic BP 80 70 75 70 66 - 69  Wt. (Lbs) 168 170 170.2 174.04 163 - 172.5  BMI 31.74 32.12 32.16 32.88 30.8 32.59 32.59

## 2020-10-28 NOTE — Assessment & Plan Note (Signed)
  Patient re-educated about  the importance of commitment to a  minimum of 150 minutes of exercise per week as able.  The importance of healthy food choices with portion control discussed, as well as eating regularly and within a 12 hour window most days. The need to choose "clean , green" food 50 to 75% of the time is discussed, as well as to make water the primary drink and set a goal of 64 ounces water daily.    Weight /BMI 10/26/2020 10/03/2020 05/09/2020  WEIGHT 168 lb 170 lb 170 lb 3.2 oz  HEIGHT 5\' 1"  5\' 1"  5\' 1"   BMI 31.74 kg/m2 32.12 kg/m2 32.16 kg/m2

## 2020-10-28 NOTE — Progress Notes (Signed)
[name] [name]     MRN: [id number]      DOB: 12/25/1940   HPI [name] [name] is here for follow up and re-evaluation of chronic medical conditions, medication management and review of any available recent lab and radiology data.  Preventive health is updated, specifically  Cancer screening and Immunization.   Questions or concerns regarding consultations or procedures which the PT has had in the interim are  addressed. The PT denies any adverse reactions to current medications since the last visit.  There are no new concerns.  There are no specific complaints   ROS Denies recent fever or chills. Denies sinus pressure, nasal congestion, ear pain or sore throat. Denies chest congestion, productive cough or wheezing. Denies chest pains, palpitations and leg swelling Denies abdominal pain, nausea, vomiting,diarrhea or constipation.   Denies dysuria, frequency, hesitancy or incontinence.  C/o  joint pain, swelling and limitation in mobility. C/o numbness of right fingertips, thumb, index and middle , also right hand weakness, worse in past 2 months Denies headaches, seizures, numbness, or tingling. Denies depression, anxiety or insomnia. Denies skin break down or rash.   PE  BP 138/80   Pulse 63   Temp 98.1 F (36.7 C) (Temporal)   Ht 5\' 1"  (1.549 m)   Wt 168 lb (76.2 kg)   SpO2 95%   BMI 31.74 kg/m   Patient alert and oriented and in no cardiopulmonary distress.  HEENT: No facial asymmetry, EOMI,     Neck supple .  Chest: Clear to auscultation bilaterally.  CVS: S1, S2 no murmurs, no S3.Regular rate.  ABD: Soft non tender.   Ext: No edema  MS: decreased  ROM spine, shoulders, hips and knees.  Skin: Intact, no ulcerations or rash noted.  Psych: Good eye contact, normal affect. Memory intact not anxious or depressed appearing.  CNS: CN 2-12 intact, reduced power in right hand , grade 4   Assessment & Plan  Hypothyroidism Not at goal re treatment , med adjustment  made, re eval in 12 weeks  Numbness of fingers Right 3 fingers, ortho to eval, likely CTS  Obesity (BMI 30.0-34.9)  Patient re-educated about  the importance of commitment to a  minimum of 150 minutes of exercise per week as able.  The importance of healthy food choices with portion control discussed, as well as eating regularly and within a 12 hour window most days. The need to choose "clean , green" food 50 to 75% of the time is discussed, as well as to make water the primary drink and set a goal of 64 ounces water daily.    Weight /BMI 10/26/2020 10/03/2020 05/09/2020  WEIGHT 168 lb 170 lb 170 lb 3.2 oz  HEIGHT 5\' 1"  5\' 1"  5\' 1"   BMI 31.74 kg/m2 32.12 kg/m2 32.16 kg/m2      Hyperlipidemia LDL goal <70 Hyperlipidemia:Low fat diet discussed and encouraged.   Lipid Panel  Lab Results  Component Value Date   CHOL 130 10/19/2020   HDL 57 10/19/2020   LDLCALC 54 10/19/2020   TRIG 106 10/19/2020   CHOLHDL 2.3 10/19/2020   Controlled, no change in medication     Essential hypertension, malignant Controlled, no change in medication DASH diet and commitment to daily physical activity for a minimum of 30 minutes discussed and encouraged, as a part of hypertension management. The importance of attaining a healthy weight is also discussed.  BP/Weight 10/26/2020 10/03/2020 05/09/2020 04/28/2020 02/24/2020 12/06/6193 0/03/3266  Systolic BP 124 580 998 338  443 - 154  Diastolic BP 80 70 75 70 66 - 69  Wt. (Lbs) 168 170 170.2 174.04 163 - 172.5  BMI 31.74 32.12 32.16 32.88 30.8 32.59 32.59       GERD (gastroesophageal reflux disease) Controlled, no change in medication   Gout Stable, no recent flare  Hand pain, right Increased , debilitating pain x 2 months, refer to Ortho

## 2020-10-28 NOTE — Assessment & Plan Note (Signed)
Not at goal re treatment , med adjustment made, re eval in 12 weeks

## 2020-10-28 NOTE — Assessment & Plan Note (Signed)
Hyperlipidemia:Low fat diet discussed and encouraged.   Lipid Panel  Lab Results  Component Value Date   CHOL 130 10/19/2020   HDL 57 10/19/2020   LDLCALC 54 10/19/2020   TRIG 106 10/19/2020   CHOLHDL 2.3 10/19/2020   Controlled, no change in medication

## 2020-11-01 DIAGNOSIS — H40053 Ocular hypertension, bilateral: Secondary | ICD-10-CM | POA: Diagnosis not present

## 2020-11-02 ENCOUNTER — Ambulatory Visit (INDEPENDENT_AMBULATORY_CARE_PROVIDER_SITE_OTHER): Payer: Medicare Other | Admitting: Orthopaedic Surgery

## 2020-11-02 ENCOUNTER — Other Ambulatory Visit: Payer: Self-pay

## 2020-11-02 ENCOUNTER — Encounter: Payer: Self-pay | Admitting: Orthopaedic Surgery

## 2020-11-02 DIAGNOSIS — G5601 Carpal tunnel syndrome, right upper limb: Secondary | ICD-10-CM | POA: Insufficient documentation

## 2020-11-02 NOTE — Progress Notes (Signed)
Office Visit Note   Patient: [name] [name]           Date of Birth: 1941-04-01           MRN: [id number] Visit Date: 11/02/2020              Requested by: Fayrene Helper, [address], [place] [place],  [place] [address] PCP: Fayrene Helper, MD   Assessment & Plan: Visit Diagnoses:  1. Carpal tunnel syndrome, right upper limb     Plan: [name] [name] has been experiencing numbness in the radial 3 digits of her right dominant hand for probably 3 months.  She is not having much pain but notes that she has persistent numbness that has become a nuisance in her activities of daily living.  She notes she has good feeling to the little and ring finger.  Clinically she has carpal tunnel syndrome with a positive Tinel's and Phalen's.  I discussed the diagnosis and treatment options.  She would prefer not to pursue surgical decompression and would like to try a volar wrist splint.  We will reevaluate in 1 month.  Can always consider EMGs and nerve conduction studies should she change her mind.  Follow-Up Instructions: Return in about 1 month (around 12/03/2020).   Orders:  No orders of the defined types were placed in this encounter.  No orders of the defined types were placed in this encounter.     Procedures: No procedures performed   Clinical Data: No additional findings.   Subjective: Chief Complaint  Patient presents with  . Right Hand - Numbness  Patient presents today for right hand numbness. She said that her PCP referred her here. She has numbness in her thumb, index, and long finger on the right hand. No injury. She states that it does not hurt or bother her at night. She does have difficulty gripping anything due to the numbness. She is not sure how long this has been going on, but states that it has bothered for several months. She is right hand dominant.  No related forearm and elbow or arm pain no neck discomfort  HPI  Review of  Systems   Objective: Vital Signs: Ht 5\' 1"  (1.549 m)   Wt 168 lb (76.2 kg)   BMI 31.74 kg/m   Physical Exam Constitutional:      Appearance: She is well-developed.  Eyes:     Pupils: Pupils are equal, round, and reactive to light.  Pulmonary:     Effort: Pulmonary effort is normal.  Skin:    General: Skin is warm and dry.  Neurological:     Mental Status: She is alert and oriented to person, place, and time.  Psychiatric:        Behavior: Behavior normal.     Ortho Exam positive Phalen's and Tinel's right wrist with tingling into the radial 3 digits.  Good capillary refill.  Able to make a good fist.  Has some atrophy of the thenar eminence but is able to oppose thumb to little finger.  Has decreased sensibility to the radial 3 digits but seems to have normal sensation to the ulnar 2 digits  Specialty Comments:  No specialty comments available.  Imaging: No results found.   PMFS History: Patient Active Problem List   Diagnosis Date Noted  . Carpal tunnel syndrome, right upper limb 11/02/2020  . Hand pain, right 10/26/2020  . Numbness of fingers 10/26/2020  . Gout 08/04/2018  . Hyperuricemia  08/04/2018  . Osteoarthritis of right knee 03/04/2018  . IDA (iron deficiency anemia) 03/04/2018  . CKD (chronic kidney disease) stage 3, GFR 30-59 ml/min (HCC) 02/26/2018  . Carotid stenosis 02/01/2018  . 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 GERD (gastroesophageal reflux disease) 11/10/2013  . Bradycardia, sinus 08/05/2013  . Metabolic syndrome X 81/85/6314  . CAD (coronary artery disease)   . Ejection fraction   . CKD (chronic kidney disease) 07/11/2011  . Hypothyroidism 01/31/2009  . CORONARY ARTERY BYPASS GRAFT, HX OF 01/31/2009  . Carotid artery disease ([place]) 11/30/2008  . Allergic rhinitis 08/12/2008  . Hyperlipidemia LDL goal <70 10/07/2007  . Obesity (BMI 30.0-34.9) 10/07/2007  . Essential hypertension, malignant 10/07/2007   Past  Medical History:  Diagnosis Date  . 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 Carotid artery disease ([place])   . CKD (chronic kidney disease), stage III ([place])   . Essential hypertension   . Glaucoma    Laser surgery 2010  . Hyperlipidemia   . Hypothyroidism   . Iron deficiency anemia   . Obesity     Family History  Problem Relation Age of Onset  . Aneurysm Mother   . Hypertension Mother   . Coronary artery disease Mother   . Coronary artery disease Father   . Diabetes Son   . Pancreatic cancer Son   . Colon cancer Brother 65       lived 18 months afterwards  . Liver cancer Brother 66  . Diabetes Sister   . Coronary artery disease Sister   . Diverticulosis Sister   . COPD Sister   . Kidney disease Sister   . Diabetes Sister   . Hypertension Sister   . Breast cancer Sister 60  . Hearing loss Sister   . Heart Problems Sister        pacemaker    Past Surgical History:  Procedure Laterality Date  . COLONOSCOPY N/A 02/04/2013   Procedure: COLONOSCOPY;  Surgeon: Rogene Houston, MD;  [facility];  Service: Endoscopy;  Laterality: N/A;  1015  . COLONOSCOPY N/A 12/21/2016   Procedure: COLONOSCOPY;  Surgeon: Rogene Houston, MD;  [facility];  Service: Endoscopy;  Laterality: N/A;  . CORONARY ARTERY BYPASS GRAFT  October 2005   Dr. Roxy Manns - LIMA to LAD, SVG to OM 2 and OM 3, SVG to PDA  . ESOPHAGOGASTRODUODENOSCOPY N/A 12/21/2016   Procedure: ESOPHAGOGASTRODUODENOSCOPY (EGD);  Surgeon: Rogene Houston, MD;  [facility];  Service: Endoscopy;  Laterality: N/A;  11:10  . ESOPHAGOGASTRODUODENOSCOPY N/A 02/27/2018   Procedure: ESOPHAGOGASTRODUODENOSCOPY (EGD);  Surgeon: Rogene Houston, MD;  [facility];  Service: Endoscopy;  Laterality: N/A;  . EYE SURGERY  2010   laser treatment to both eyes for glaucoma  . GIVENS CAPSULE STUDY N/A 03/07/2018   Procedure: GIVENS CAPSULE STUDY;  Surgeon: Rogene Houston, MD;  [facility];  Service: Endoscopy;  Laterality: N/A;  . GIVENS CAPSULE STUDY N/A 02/05/2020   Procedure: GIVENS CAPSULE STUDY;  Surgeon: Rogene Houston, MD;  [facility];  Service: Endoscopy;  Laterality: N/A;  730   Social History   Occupational History  . Occupation: retired   Tobacco Use  . Smoking status: Former Smoker    Packs/day: 0.25    Years: 40.00    Pack years: 10.00    Types: Cigarettes    Quit date:  07/03/2003    Years since quitting: 17.3  . Smokeless tobacco: Never Used  Vaping Use  . Vaping Use: Never used  Substance and Sexual Activity  . Alcohol use: No    Alcohol/week: 0.0 standard drinks  . Drug use: No  . Sexual activity: Never

## 2020-11-29 ENCOUNTER — Other Ambulatory Visit: Payer: Self-pay | Admitting: Family Medicine

## 2020-12-07 ENCOUNTER — Ambulatory Visit (INDEPENDENT_AMBULATORY_CARE_PROVIDER_SITE_OTHER): Payer: Medicare Other

## 2020-12-07 ENCOUNTER — Other Ambulatory Visit: Payer: Self-pay

## 2020-12-07 VITALS — BP 167/75 | HR 56 | Temp 97.9°F | Ht 61.0 in | Wt 166.2 lb

## 2020-12-07 DIAGNOSIS — Z0000 Encounter for general adult medical examination without abnormal findings: Secondary | ICD-10-CM | POA: Diagnosis not present

## 2020-12-07 NOTE — Patient Instructions (Signed)
[name] [name] , Thank you for taking time to come for your Medicare Wellness Visit. I appreciate your ongoing commitment to your health goals. Please review the following plan we discussed and let me know if I can assist you in the future.   Screening recommendations/referrals: Colonoscopy: Up to date, next due 12/21/2021 Mammogram: Up to date, please keep appointment on 12/22/2020 Bone Density: No longer required Recommended yearly ophthalmology/optometry visit for glaucoma screening and checkup Recommended yearly dental visit for hygiene and checkup  Vaccinations: Influenza vaccine: Up to date, next due fall 2022 Pneumococcal vaccine: Completed series Tdap vaccine: Currently due, you may await and injury to receive Shingles vaccine: Completed series    Advanced directives: Advance directive discussed with you today. Even though you declined this today please call our office should you change your mind and we can give you the proper paperwork for you to fill out.   Conditions/risks identified: None  Next appointment: None   Preventive Care 65 Years and Older, Female Preventive care refers to lifestyle choices and visits with your health care provider that can promote health and wellness. What does preventive care include?  A yearly physical exam. This is also called an annual well check.  Dental exams once or twice a year.  Routine eye exams. Ask your health care provider how often you should have your eyes checked.  Personal lifestyle choices, including:  Daily care of your teeth and gums.  Regular physical activity.  Eating a healthy diet.  Avoiding tobacco and drug use.  Limiting alcohol use.  Practicing safe sex.  Taking low-dose aspirin every day.  Taking vitamin and mineral supplements as recommended by your health care provider. What happens during an annual well check? The services and screenings done by your health care provider during your annual well check  will depend on your age, overall health, lifestyle risk factors, and family history of disease. Counseling  Your health care provider may ask you questions about your:  Alcohol use.  Tobacco use.  Drug use.  Emotional well-being.  Home and relationship well-being.  Sexual activity.  Eating habits.  History of falls.  Memory and ability to understand (cognition).  Work and work Statistician.  Reproductive health. Screening  You may have the following tests or measurements:  Height, weight, and BMI.  Blood pressure.  Lipid and cholesterol levels. These may be checked every 5 years, or more frequently if you are over 46 years old.  Skin check.  Lung cancer screening. You may have this screening every year starting at age 16 if you have a 30-pack-year history of smoking and currently smoke or have quit within the past 15 years.  Fecal occult blood test (FOBT) of the stool. You may have this test every year starting at age 76.  Flexible sigmoidoscopy or colonoscopy. You may have a sigmoidoscopy every 5 years or a colonoscopy every 10 years starting at age 48.  Hepatitis C blood test.  Hepatitis B blood test.  Sexually transmitted disease (STD) testing.  Diabetes screening. This is done by checking your blood sugar (glucose) after you have not eaten for a while (fasting). You may have this done every 1-3 years.  Bone density scan. This is done to screen for osteoporosis. You may have this done starting at age 46.  Mammogram. This may be done every 1-2 years. Talk to your health care provider about how often you should have regular mammograms. Talk with your health care provider about your test results, treatment  options, and if necessary, the need for more tests. Vaccines  Your health care provider may recommend certain vaccines, such as:  Influenza vaccine. This is recommended every year.  Tetanus, diphtheria, and acellular pertussis (Tdap, Td) vaccine. You may  need a Td booster every 10 years.  Zoster vaccine. You may need this after age 37.  Pneumococcal 13-valent conjugate (PCV13) vaccine. One dose is recommended after age 69.  Pneumococcal polysaccharide (PPSV23) vaccine. One dose is recommended after age 28. Talk to your health care provider about which screenings and vaccines you need and how often you need them. This information is not intended to replace advice given to you by your health care provider. Make sure you discuss any questions you have with your health care provider. Document Released: 07/15/2015 Document Revised: 03/07/2016 Document Reviewed: 04/19/2015 Elsevier Interactive Patient Education  2017 [employer] Prevention in the Home Falls can cause injuries. They can happen to people of all ages. There are many things you can do to make your home safe and to help prevent falls. What can I do on the outside of my home?  Regularly fix the edges of walkways and driveways and fix any cracks.  Remove anything that might make you trip as you walk through a door, such as a raised step or threshold.  Trim any bushes or trees on the path to your home.  Use bright outdoor lighting.  Clear any walking paths of anything that might make someone trip, such as rocks or tools.  Regularly check to see if handrails are loose or broken. Make sure that both sides of any steps have handrails.  Any raised decks and porches should have guardrails on the edges.  Have any leaves, snow, or ice cleared regularly.  Use sand or salt on walking paths during winter.  Clean up any spills in your garage right away. This includes oil or grease spills. What can I do in the bathroom?  Use night lights.  Install grab bars by the toilet and in the tub and shower. Do not use towel bars as grab bars.  Use non-skid mats or decals in the tub or shower.  If you need to sit down in the shower, use a plastic, non-slip stool.  Keep the floor  dry. Clean up any water that spills on the floor as soon as it happens.  Remove soap buildup in the tub or shower regularly.  Attach bath mats securely with double-sided non-slip rug tape.  Do not have throw rugs and other things on the floor that can make you trip. What can I do in the bedroom?  Use night lights.  Make sure that you have a light by your bed that is easy to reach.  Do not use any sheets or blankets that are too big for your bed. They should not hang down onto the floor.  Have a firm chair that has side arms. You can use this for support while you get dressed.  Do not have throw rugs and other things on the floor that can make you trip. What can I do in the kitchen?  Clean up any spills right away.  Avoid walking on wet floors.  Keep items that you use a lot in easy-to-reach places.  If you need to reach something above you, use a strong step stool that has a grab bar.  Keep electrical cords out of the way.  Do not use floor polish or wax that makes floors slippery.  If you must use wax, use non-skid floor wax.  Do not have throw rugs and other things on the floor that can make you trip. What can I do with my stairs?  Do not leave any items on the stairs.  Make sure that there are handrails on both sides of the stairs and use them. Fix handrails that are broken or loose. Make sure that handrails are as long as the stairways.  Check any carpeting to make sure that it is firmly attached to the stairs. Fix any carpet that is loose or worn.  Avoid having throw rugs at the top or bottom of the stairs. If you do have throw rugs, attach them to the floor with carpet tape.  Make sure that you have a light switch at the top of the stairs and the bottom of the stairs. If you do not have them, ask someone to add them for you. What else can I do to help prevent falls?  Wear shoes that:  Do not have high heels.  Have rubber bottoms.  Are comfortable and fit you  well.  Are closed at the toe. Do not wear sandals.  If you use a stepladder:  Make sure that it is fully opened. Do not climb a closed stepladder.  Make sure that both sides of the stepladder are locked into place.  Ask someone to hold it for you, if possible.  Clearly mark and make sure that you can see:  Any grab bars or handrails.  First and last steps.  Where the edge of each step is.  Use tools that help you move around (mobility aids) if they are needed. These include:  Canes.  Walkers.  Scooters.  Crutches.  Turn on the lights when you go into a dark area. Replace any light bulbs as soon as they burn out.  Set up your furniture so you have a clear path. Avoid moving your furniture around.  If any of your floors are uneven, fix them.  If there are any pets around you, be aware of where they are.  Review your medicines with your doctor. Some medicines can make you feel dizzy. This can increase your chance of falling. Ask your doctor what other things that you can do to help prevent falls. This information is not intended to replace advice given to you by your health care provider. Make sure you discuss any questions you have with your health care provider. Document Released: 04/14/2009 Document Revised: 11/24/2015 Document Reviewed: 07/23/2014 Elsevier Interactive Patient Education  2017 [employer].

## 2020-12-07 NOTE — Progress Notes (Addendum)
Subjective:   [name] [name] is a 80 y.o. female who presents for Medicare Annual (Subsequent) preventive examination.  Review of Systems    N/A Cardiac Risk Factors include: advanced age (>85men, >64 women);hypertension;dyslipidemia;obesity (BMI >30kg/m2)     Objective:    Today's Vitals   12/07/20 1339 12/07/20 1340  BP: (!) 167/75   Pulse: (!) 56   Temp: 97.9 F (36.6 C)   TempSrc: Oral   SpO2: 93%   Weight: 166 lb 4 oz (75.4 kg)   Height: 5\' 1"  (1.549 m)   PainSc:  9    Body mass index is 31.41 kg/m.  Advanced Directives 12/07/2020 02/26/2018 02/26/2018 12/21/2016 09/20/2016 12/07/2014 02/04/2013  Does Patient Have a Medical Advance Directive? No No No No No No Patient does not have advance directive;Patient would not like information  Would patient like information on creating a medical advance directive? No - Patient declined No - Patient declined - No - Patient declined Yes (MAU/Ambulatory/Procedural Areas - Information given) No - patient declined information -  Pre-existing out of facility DNR order (yellow form or pink MOST form) - - - - - - No    Current Medications (verified) Outpatient Encounter Medications as of 12/07/2020  Medication Sig  . acetaminophen (TYLENOL) 500 MG tablet Take 1,000 mg by mouth every 6 (six) hours as needed for mild pain or headache.   Marland Kitchen amLODipine (NORVASC) 5 MG tablet TAKE ONE TABLET BY MOUTH DAILY.  Marland Kitchen aspirin 81 MG tablet Take 1 tablet (81 mg total) by mouth daily. Restart on 03/02/18  . cetirizine (ZYRTEC) 10 MG tablet TAKE ONE TABLET BY MOUTH DAILY.  . ferrous sulfate 325 (65 FE) MG tablet Take 1 tablet (325 mg total) by mouth 2 (two) times daily with a meal.  . fish oil-omega-3 fatty acids 1000 MG capsule Take 1,200 mg by mouth daily.   . fluticasone (FLONASE) 50 MCG/ACT nasal spray USE 2 SPRAYS IN EACH NOSTRIL ONCE DAILY.  SHAKE GENTLY BEFORE USING. (Patient taking differently: Place 2 sprays into both nostrils daily.)  . hydrALAZINE  (APRESOLINE) 50 MG tablet TAKE ONE TABLET BY MOUTH THREE TIMES DAILY. (Patient taking differently: 100 mg 2 (two) times daily.)  . latanoprost (XALATAN) 0.005 % ophthalmic solution Place 1 drop into both eyes at bedtime. One drop each eye at bedtime  . levothyroxine (SYNTHROID) 125 MCG tablet Take 1 tablet (125 mcg total) by mouth daily.  . metoprolol succinate (TOPROL-XL) 100 MG 24 hr tablet TAKE ONE TABLET BY MOUTH DAILY. TAKE WITH OR IMMEDIATELY FOLLOWING A MEAL.  Marland Kitchen omeprazole (PRILOSEC) 20 MG capsule TAKE ONE CAPSULE BY MOUTH ONCE DAILY  . rosuvastatin (CRESTOR) 10 MG tablet TAKE ONE TABLET BY MOUTH DAILY.  Marland Kitchen UNABLE TO FIND Shower chair x 1  DX M54.36  . Misc. Devices (WALL GRAB BAR) MISC Grab bar for shower x 2 DX high fall risk (Patient not taking: Reported on 12/07/2020)  . nitroGLYCERIN (NITROSTAT) 0.4 MG SL tablet DISSOLVE ONE TABLET UNDER TONGUE EVERY 5 MINUTES UP TO 3 DOSES AS NEEDED FOR CHEST PAIN. (Patient not taking: Reported on 12/07/2020)  . [DISCONTINUED] olmesartan (BENICAR) 5 MG tablet Take 5 mg by mouth daily.   No facility-administered encounter medications on file as of 12/07/2020.    Allergies (verified) Tramadol, Lisinopril, and Clonidine derivatives   History: Past Medical History:  Diagnosis Date  . 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 Carotid artery disease ([place])   .  CKD (chronic kidney disease), stage III ([place])   . Essential hypertension   . Glaucoma    Laser surgery 2010  . Hyperlipidemia   . Hypothyroidism   . Iron deficiency anemia   . Obesity    Past Surgical History:  Procedure Laterality Date  . COLONOSCOPY N/A 02/04/2013   Procedure: COLONOSCOPY;  Surgeon: Rogene Houston, MD;  [facility];  Service: Endoscopy;  Laterality: N/A;  1015  . COLONOSCOPY N/A 12/21/2016   Procedure: COLONOSCOPY;  Surgeon: Rogene Houston, MD;  [facility];  Service: Endoscopy;  Laterality: N/A;  . CORONARY  ARTERY BYPASS GRAFT  October 2005   Dr. Roxy Manns - LIMA to LAD, SVG to OM 2 and OM 3, SVG to PDA  . ESOPHAGOGASTRODUODENOSCOPY N/A 12/21/2016   Procedure: ESOPHAGOGASTRODUODENOSCOPY (EGD);  Surgeon: Rogene Houston, MD;  [facility];  Service: Endoscopy;  Laterality: N/A;  11:10  . ESOPHAGOGASTRODUODENOSCOPY N/A 02/27/2018   Procedure: ESOPHAGOGASTRODUODENOSCOPY (EGD);  Surgeon: Rogene Houston, MD;  [facility];  Service: Endoscopy;  Laterality: N/A;  . EYE SURGERY  2010   laser treatment to both eyes for glaucoma  . GIVENS CAPSULE STUDY N/A 03/07/2018   Procedure: GIVENS CAPSULE STUDY;  Surgeon: Rogene Houston, MD;  [facility];  Service: Endoscopy;  Laterality: N/A;  . GIVENS CAPSULE STUDY N/A 02/05/2020   Procedure: GIVENS CAPSULE STUDY;  Surgeon: Rogene Houston, MD;  [facility];  Service: Endoscopy;  Laterality: N/A;  730   Family History  Problem Relation Age of Onset  . Aneurysm Mother   . Hypertension Mother   . Coronary artery disease Mother   . Coronary artery disease Father   . Diabetes Son   . Pancreatic cancer Son   . Colon cancer Brother 36       lived 18 months afterwards  . Liver cancer Brother 62  . Diabetes Sister   . Coronary artery disease Sister   . Diverticulosis Sister   . COPD Sister   . Kidney disease Sister   . Diabetes Sister   . Hypertension Sister   . Breast cancer Sister 69  . Hearing loss Sister   . Heart Problems Sister        pacemaker   Social History   Socioeconomic History  . Marital status: Married    Spouse name: Not on file  . Number of children: 1  . Years of education: Not on file  . Highest education level: Not on file  Occupational History  . Occupation: retired   Tobacco Use  . Smoking status: Former Smoker    Packs/day: 0.25    Years: 40.00    Pack years: 10.00    Types: Cigarettes    Quit date: 07/03/2003    Years since quitting: 17.4  . Smokeless tobacco: Never Used  Vaping  Use  . Vaping Use: Never used  Substance and Sexual Activity  . Alcohol use: No    Alcohol/week: 0.0 standard drinks  . Drug use: No  . Sexual activity: Never  Other Topics Concern  . Not on file  Social History Narrative  . Not on file   Social Determinants of Health   Financial Resource Strain: Low Risk   . Difficulty of Paying Living Expenses: Not hard at all  Food Insecurity: No Food Insecurity  . Worried About Charity fundraiser in the Last Year: Never true  . Ran Out of Food  in the Last Year: Never true  Transportation Needs: No Transportation Needs  . Lack of Transportation (Medical): No  . Lack of Transportation (Non-Medical): No  Physical Activity: Insufficiently Active  . Days of Exercise per Week: 7 days  . Minutes of Exercise per Session: 10 min  Stress: No Stress Concern Present  . Feeling of Stress : Not at all  Social Connections: Moderately Integrated  . Frequency of Communication with Friends and Family: More than three times a week  . Frequency of Social Gatherings with Friends and Family: More than three times a week  . Attends Religious Services: Never  . Active Member of Clubs or Organizations: Yes  . Attends Archivist Meetings: Never  . Marital Status: Married    Tobacco Counseling Counseling given: Not Answered   Clinical Intake:  Pre-visit preparation completed: Yes  Pain : 0-10 Pain Score: 9  Pain Type: Chronic pain Pain Location: Knee Pain Orientation: Right Pain Descriptors / Indicators: Aching Pain Onset: More than a month ago     Nutritional Risks: None Diabetes: No  How often do you need to have someone help you when you read instructions, pamphlets, or other written materials from your doctor or pharmacy?: 1 - Never  Diabetic?No  Interpreter Needed?: No  Information entered by :: [place] of Daily Living In your present state of health, do you have any difficulty performing the following  activities: 12/07/2020  Hearing? N  Vision? N  Difficulty concentrating or making decisions? N  Walking or climbing stairs? Y  Dressing or bathing? N  Doing errands, shopping? N  Preparing Food and eating ? N  Using the Toilet? N  In the past six months, have you accidently leaked urine? N  Do you have problems with loss of bowel control? N  Managing your Medications? N  Managing your Finances? N  Housekeeping or managing your Housekeeping? N  Some recent data might be hidden    Patient Care Team: Fayrene Helper, MD as PCP - General Domenic Polite Aloha Gell, MD as PCP - Cardiology (Cardiology) Carlena Bjornstad, MD as Consulting Physician (Cardiology) Warden Fillers, MD as Consulting Physician (Optometry) Steffanie Rainwater, DPM as Consulting Physician (Podiatry)  Indicate any recent Medical Services you may have received from other than [facility] providers in the past year (date may be approximate).     Assessment:   This is a routine wellness examination for [name] [name].  Hearing/Vision screen  Hearing Screening   125Hz  250Hz  500Hz  1000Hz  2000Hz  3000Hz  4000Hz  6000Hz  8000Hz   Right ear:           Left ear:           Vision Screening Comments: Patient states gets eyes examined twice per year. Has hx of glaucoma. Currently wears glasses   Dietary issues and exercise activities discussed: Current Exercise Habits: Home exercise routine, Type of exercise: strength training/weights, Time (Minutes): 10, Frequency (Times/Week): 7, Weekly Exercise (Minutes/Week): 70, Intensity: Mild, Exercise limited by: orthopedic condition(s)  Goals Addressed            This Visit's Progress   . DIET - INCREASE WATER INTAKE   On track   . Exercise 3x per week (30 min per time)   On track    Recommend chair exercises 3 times a week for 30-45 minutes at a time as tolerated.      Depression Screen PHQ 2/9 Scores 12/07/2020 10/26/2020 04/28/2020 01/25/2020 01/27/2019 12/02/2018 11/11/2018  PHQ - 2 Score 0  0 0 0 0  0 0  PHQ- 9 Score - - - - - - -    Fall Risk Fall Risk  12/07/2020 10/26/2020 04/28/2020 01/25/2020 11/16/2019  Falls in the past year? 0 0 1 1 0  Number falls in past yr: 0 0 1 1 0  Injury with Fall? 0 0 1 0 0  Risk for fall due to : No Fall Risks No Fall Risks - - -  Follow up Falls evaluation completed;Falls prevention discussed Falls evaluation completed - - -    FALL RISK PREVENTION PERTAINING TO THE HOME:  Any stairs in or around the home? No  If so, are there any without handrails? No  Home free of loose throw rugs in walkways, pet beds, electrical cords, etc? Yes  Adequate lighting in your home to reduce risk of falls? Yes   ASSISTIVE DEVICES UTILIZED TO PREVENT FALLS:  Life alert? No  Use of a cane, walker or w/c? Yes  Grab bars in the bathroom? No  Shower chair or bench in shower? Yes  Elevated toilet seat or a handicapped toilet? No   TIMED UP AND GO:  Was the test performed? Yes .  Length of time to ambulate 10 feet: 4 sec.   Gait slow and steady with assistive device  Cognitive Function:   Normal cognitive status assessed by direct observation by this Nurse Health Advisor. No abnormalities found.    6CIT Screen 11/16/2019 11/11/2018 11/08/2017 09/20/2016  What Year? 0 points 0 points 0 points 0 points  What month? 0 points 0 points 0 points 0 points  What time? 0 points 0 points 0 points 0 points  Count back from 20 0 points 0 points 0 points 0 points  Months in reverse 0 points 0 points 0 points 0 points  Repeat phrase 0 points 4 points 0 points 0 points  Total Score 0 4 0 0    Immunizations Immunization History  Administered Date(s) Administered  . Fluad Quad(high Dose 65+) 03/31/2019, 04/28/2020  . Influenza Split 03/11/2012  . Influenza,inj,Quad PF,6+ Mos 04/02/2013, 05/17/2014, 06/29/2015, 06/27/2016, 02/25/2017, 03/04/2018  . PFIZER(Purple Top)SARS-COV-2 Vaccination 08/07/2019, 08/28/2019, 04/21/2020, 11/16/2020  . Pneumococcal Conjugate-13 01/04/2014   . Pneumococcal Polysaccharide-23 09/25/2007  . Td 09/25/2007  . Zoster, Live 03/12/2012    TDAP status: Due, Education has been provided regarding the importance of this vaccine. Advised may receive this vaccine at local pharmacy or Health Dept. Aware to provide a copy of the vaccination record if obtained from local pharmacy or Health Dept. Verbalized acceptance and understanding.  Flu Vaccine status: Up to date  Pneumococcal vaccine status: Up to date  Covid-19 vaccine status: Completed vaccines  Qualifies for Shingles Vaccine? Yes   Zostavax completed Yes   Shingrix Completed?: No.    Education has been provided regarding the importance of this vaccine. Patient has been advised to call insurance company to determine out of pocket expense if they have not yet received this vaccine. Advised may also receive vaccine at local pharmacy or Health Dept. Verbalized acceptance and understanding.  Screening Tests Health Maintenance  Topic Date Due  . Pneumococcal Vaccine 63-59 Years old (1 of 4 - PCV13) Never done  . Zoster Vaccines- Shingrix (1 of 2) Never done  . TETANUS/TDAP  04/19/2022 (Originally 09/24/2017)  . INFLUENZA VACCINE  01/30/2021  . DEXA SCAN  Completed  . COVID-19 Vaccine  Completed  . Hepatitis C Screening  Completed  . PNA vac Low Risk Adult  Completed  .  HPV VACCINES  Aged Out    Health Maintenance  Health Maintenance Due  Topic Date Due  . Pneumococcal Vaccine 24-2 Years old (1 of 4 - PCV13) Never done  . Zoster Vaccines- Shingrix (1 of 2) Never done    Colorectal cancer screening: Type of screening: Colonoscopy. Completed 12/21/2016. Repeat every 5 years  Mammogram status: Ordered 10/26/2020. Pt provided with contact info and advised to call to schedule appt.   Bone Density status: Completed 12/21/2019. Results reflect: Bone density results: NORMAL. Repeat every 0 years.  Lung Cancer Screening: (Low Dose CT Chest recommended if Age 31-80 years, 30 pack-year  currently smoking OR have quit w/in 15years.) does not qualify.   Lung Cancer Screening Referral: N/A  Additional Screening:  Hepatitis C Screening: does qualify; Completed 12/21/2019  Vision Screening: Recommended annual ophthalmology exams for early detection of glaucoma and other disorders of the eye. Is the patient up to date with their annual eye exam?  Yes  Who is the provider or what is the name of the office in which the patient attends annual eye exams? Dr. Katy Fitch If pt is not established with a provider, would they like to be referred to a provider to establish care? No .   Dental Screening: Recommended annual dental exams for proper oral hygiene  Community Resource Referral / Chronic Care Management: CRR required this visit?  No   CCM required this visit?  No      Plan:     I have personally reviewed and noted the following in the patient's chart:   . Medical and social history . Use of alcohol, tobacco or illicit drugs  . Current medications and supplements including opioid prescriptions.  . Functional ability and status . Nutritional status . Physical activity . Advanced directives . List of other physicians . Hospitalizations, surgeries, and ER visits in previous 12 months . Vitals . Screenings to include cognitive, depression, and falls . Referrals and appointments  In addition, I have reviewed and discussed with patient certain preventive protocols, quality metrics, and best practice recommendations. A written personalized care plan for preventive services as well as general preventive health recommendations were provided to patient.     Ofilia Neas, LPN   03/09/2640   Nurse Notes: None

## 2020-12-14 ENCOUNTER — Other Ambulatory Visit: Payer: Self-pay

## 2020-12-14 ENCOUNTER — Ambulatory Visit: Payer: Medicare Other | Admitting: Orthopaedic Surgery

## 2020-12-19 ENCOUNTER — Other Ambulatory Visit: Payer: Self-pay | Admitting: Family Medicine

## 2020-12-22 ENCOUNTER — Inpatient Hospital Stay (HOSPITAL_COMMUNITY): Admission: RE | Admit: 2020-12-22 | Payer: Medicare Other | Source: Ambulatory Visit

## 2020-12-23 DIAGNOSIS — D631 Anemia in chronic kidney disease: Secondary | ICD-10-CM | POA: Diagnosis not present

## 2020-12-23 DIAGNOSIS — N1832 Chronic kidney disease, stage 3b: Secondary | ICD-10-CM | POA: Diagnosis not present

## 2020-12-23 DIAGNOSIS — N189 Chronic kidney disease, unspecified: Secondary | ICD-10-CM | POA: Diagnosis not present

## 2020-12-23 DIAGNOSIS — I5032 Chronic diastolic (congestive) heart failure: Secondary | ICD-10-CM | POA: Diagnosis not present

## 2020-12-26 ENCOUNTER — Other Ambulatory Visit: Payer: Self-pay

## 2020-12-26 ENCOUNTER — Ambulatory Visit (HOSPITAL_COMMUNITY)
Admission: RE | Admit: 2020-12-26 | Discharge: 2020-12-26 | Disposition: A | Discharge status: Home / Self Care | Payer: Medicare Other | Source: Ambulatory Visit | Attending: Family Medicine | Admitting: Family Medicine

## 2020-12-26 DIAGNOSIS — Z1231 Encounter for screening mammogram for malignant neoplasm of breast: Secondary | ICD-10-CM | POA: Diagnosis not present

## 2020-12-30 DIAGNOSIS — D508 Other iron deficiency anemias: Secondary | ICD-10-CM | POA: Diagnosis not present

## 2020-12-30 DIAGNOSIS — I129 Hypertensive chronic kidney disease with stage 1 through stage 4 chronic kidney disease, or unspecified chronic kidney disease: Secondary | ICD-10-CM | POA: Diagnosis not present

## 2020-12-30 DIAGNOSIS — N189 Chronic kidney disease, unspecified: Secondary | ICD-10-CM | POA: Diagnosis not present

## 2020-12-30 DIAGNOSIS — N1832 Chronic kidney disease, stage 3b: Secondary | ICD-10-CM | POA: Diagnosis not present

## 2020-12-30 DIAGNOSIS — R809 Proteinuria, unspecified: Secondary | ICD-10-CM | POA: Diagnosis not present

## 2020-12-30 DIAGNOSIS — I5032 Chronic diastolic (congestive) heart failure: Secondary | ICD-10-CM | POA: Diagnosis not present

## 2020-12-30 DIAGNOSIS — D631 Anemia in chronic kidney disease: Secondary | ICD-10-CM | POA: Diagnosis not present

## 2021-01-03 ENCOUNTER — Encounter (INDEPENDENT_AMBULATORY_CARE_PROVIDER_SITE_OTHER): Payer: Self-pay | Admitting: *Deleted

## 2021-01-04 ENCOUNTER — Encounter (HOSPITAL_COMMUNITY)
Admission: RE | Admit: 2021-01-04 | Discharge: 2021-01-04 | Disposition: A | Discharge status: Home / Self Care | Payer: Medicare Other | Source: Ambulatory Visit | Attending: Nephrology | Admitting: Nephrology

## 2021-01-04 ENCOUNTER — Other Ambulatory Visit: Payer: Self-pay

## 2021-01-04 DIAGNOSIS — N1832 Chronic kidney disease, stage 3b: Secondary | ICD-10-CM | POA: Insufficient documentation

## 2021-01-04 DIAGNOSIS — D631 Anemia in chronic kidney disease: Secondary | ICD-10-CM | POA: Diagnosis not present

## 2021-01-04 LAB — POCT HEMOGLOBIN-HEMACUE: Hemoglobin: 7.6 g/dL — ABNORMAL LOW (ref 12.0–15.0)

## 2021-01-04 MED ORDER — SODIUM CHLORIDE 0.9 % IV SOLN
510.0000 mg | Freq: Once | INTRAVENOUS | Status: AC
  Administered 2021-01-04: 510 mg via INTRAVENOUS
  Filled 2021-01-04: qty 17

## 2021-01-04 MED ORDER — EPOETIN ALFA-EPBX 3000 UNIT/ML IJ SOLN
3000.0000 [IU] | Freq: Once | INTRAMUSCULAR | Status: AC
Start: 2021-01-04 — End: 2021-01-04
  Administered 2021-01-04: 3000 [IU] via SUBCUTANEOUS
  Filled 2021-01-04: qty 1

## 2021-01-04 MED ORDER — SODIUM CHLORIDE 0.9 % IV SOLN: INTRAVENOUS | Status: DC

## 2021-01-10 ENCOUNTER — Other Ambulatory Visit: Payer: Self-pay | Admitting: Family Medicine

## 2021-01-11 ENCOUNTER — Encounter (HOSPITAL_COMMUNITY)
Admission: RE | Admit: 2021-01-11 | Discharge: 2021-01-11 | Disposition: A | Discharge status: Home / Self Care | Payer: Medicare Other | Source: Ambulatory Visit | Attending: Nephrology | Admitting: Nephrology

## 2021-01-11 ENCOUNTER — Encounter (HOSPITAL_COMMUNITY): Payer: Self-pay

## 2021-01-11 ENCOUNTER — Other Ambulatory Visit: Payer: Self-pay

## 2021-01-11 DIAGNOSIS — N1832 Chronic kidney disease, stage 3b: Secondary | ICD-10-CM | POA: Diagnosis not present

## 2021-01-11 DIAGNOSIS — D631 Anemia in chronic kidney disease: Secondary | ICD-10-CM | POA: Diagnosis not present

## 2021-01-11 MED ORDER — SODIUM CHLORIDE 0.9 % IV SOLN: Freq: Once | INTRAVENOUS | Status: AC

## 2021-01-11 MED ORDER — SODIUM CHLORIDE 0.9 % IV SOLN
510.0000 mg | Freq: Once | INTRAVENOUS | Status: AC
  Administered 2021-01-11: 510 mg via INTRAVENOUS
  Filled 2021-01-11: qty 510

## 2021-01-12 DIAGNOSIS — E038 Other specified hypothyroidism: Secondary | ICD-10-CM | POA: Diagnosis not present

## 2021-01-13 LAB — TSH: TSH: 0.66 u[IU]/mL (ref 0.450–4.500)

## 2021-01-18 ENCOUNTER — Other Ambulatory Visit: Payer: Self-pay

## 2021-01-18 ENCOUNTER — Encounter (HOSPITAL_COMMUNITY)
Admission: RE | Admit: 2021-01-18 | Discharge: 2021-01-18 | Disposition: A | Discharge status: Home / Self Care | Payer: Medicare Other | Source: Ambulatory Visit | Attending: Nephrology | Admitting: Nephrology

## 2021-01-18 ENCOUNTER — Ambulatory Visit (INDEPENDENT_AMBULATORY_CARE_PROVIDER_SITE_OTHER): Payer: Medicare Other | Admitting: Family Medicine

## 2021-01-18 ENCOUNTER — Encounter: Payer: Self-pay | Admitting: Family Medicine

## 2021-01-18 VITALS — BP 144/60 | HR 64 | Resp 17 | Ht 61.0 in | Wt 164.0 lb

## 2021-01-18 DIAGNOSIS — D509 Iron deficiency anemia, unspecified: Secondary | ICD-10-CM | POA: Diagnosis not present

## 2021-01-18 DIAGNOSIS — K219 Gastro-esophageal reflux disease without esophagitis: Secondary | ICD-10-CM | POA: Diagnosis not present

## 2021-01-18 DIAGNOSIS — E559 Vitamin D deficiency, unspecified: Secondary | ICD-10-CM

## 2021-01-18 DIAGNOSIS — M1A9XX1 Chronic gout, unspecified, with tophus (tophi): Secondary | ICD-10-CM | POA: Diagnosis not present

## 2021-01-18 DIAGNOSIS — E038 Other specified hypothyroidism: Secondary | ICD-10-CM

## 2021-01-18 DIAGNOSIS — D631 Anemia in chronic kidney disease: Secondary | ICD-10-CM | POA: Diagnosis not present

## 2021-01-18 DIAGNOSIS — E785 Hyperlipidemia, unspecified: Secondary | ICD-10-CM | POA: Diagnosis not present

## 2021-01-18 DIAGNOSIS — N1832 Chronic kidney disease, stage 3b: Secondary | ICD-10-CM | POA: Diagnosis not present

## 2021-01-18 DIAGNOSIS — I2510 Atherosclerotic heart disease of native coronary artery without angina pectoris: Secondary | ICD-10-CM

## 2021-01-18 DIAGNOSIS — I10 Essential (primary) hypertension: Secondary | ICD-10-CM | POA: Diagnosis not present

## 2021-01-18 LAB — POCT HEMOGLOBIN-HEMACUE: Hemoglobin: 10 g/dL — ABNORMAL LOW (ref 12.0–15.0)

## 2021-01-18 NOTE — Patient Instructions (Addendum)
Annual exam in November, call if you need me before  Please get  fasting lipid, cmp and EGFr, tSH and vit D  5 days before next appointment  Take synthroid 125 mcg every day as you are doing  Thankful blood count and hypertension are better  Thanks for choosing [facility], we consider it a privelige to serve you.

## 2021-01-20 ENCOUNTER — Other Ambulatory Visit: Payer: Self-pay | Admitting: Family Medicine

## 2021-01-22 ENCOUNTER — Encounter: Payer: Self-pay | Admitting: Family Medicine

## 2021-01-22 NOTE — Assessment & Plan Note (Signed)
Hyperlipidemia:Low fat diet discussed and encouraged.   Lipid Panel  Lab Results  Component Value Date   CHOL 130 10/19/2020   HDL 57 10/19/2020   LDLCALC 54 10/19/2020   TRIG 106 10/19/2020   CHOLHDL 2.3 10/19/2020     Controlled, no change in medication

## 2021-01-22 NOTE — Assessment & Plan Note (Signed)
Asymptomatic and stable, no med chnage

## 2021-01-22 NOTE — Progress Notes (Signed)
   [name] [name]     MRN: [id number]      DOB: Feb 25, 1941   HPI [name] is here for follow up and re-evaluation of chronic medical conditions, medication management and review of any available recent lab and radiology data.  Preventive health is updated, specifically  Cancer screening and Immunization.   Questions or concerns regarding consultations or procedures which the PT has had in the interim are  addressed. The PT denies any adverse reactions to current medications since the last visit.  There are no new concerns.  There are no specific complaints   ROS Denies recent fever or chills. Denies sinus pressure, nasal congestion, ear pain or sore throat. Denies chest congestion, productive cough or wheezing. Denies chest pains, palpitations c/o ind leg swelling on olmesartan Denies abdominal pain, nausea, vomiting,diarrhea or constipation.   Denies dysuria, frequency, hesitancy or incontinence. Denies joint pain, swelling and limitation in mobility. Denies headaches, seizures, numbness, or tingling. Denies depression, anxiety or insomnia. Denies skin break down or rash.   PE  BP (!) 144/60   Pulse 64   Resp 17   Ht 5\' 1"  (1.549 m)   Wt 164 lb (74.4 kg)   SpO2 96%   BMI 30.99 kg/m   Patient alert and oriented and in no cardiopulmonary distress.  HEENT: No facial asymmetry, EOMI,     Neck supple .  Chest: Clear to auscultation bilaterally.  CVS: S1, S2 no murmurs, no S3.Regular rate.  ABD: Soft non tender.   Ext: No edema  MS: decreased  ROM spine, shoulders, hips and knees.  Skin: Intact, no ulcerations or rash noted.  Psych: Good eye contact, normal affect. Memory intact not anxious or depressed appearing.  CNS: CN 2-12 intact, power,  normal throughout.no focal deficits noted.   Assessment & Plan  Essential hypertension, malignant Improved but not adequately Controlled, no change in medication DASH diet and commitment to daily physical activity for a  minimum of 30 minutes discussed and encouraged, as a part of hypertension management. The importance of attaining a healthy weight is also discussed.  BP/Weight 01/18/2021 01/18/2021 01/11/2021 01/04/2021 12/07/2020 11/02/2020 7/51/7001  Systolic BP 749 449 675 916 384 - 665  Diastolic BP 56 60 60 54 75 - 80  Wt. (Lbs) - 164 166.23 - 166.25 168 168  BMI - 30.99 31.41 - 31.41 31.74 31.74       Hypothyroidism Controlled, no change in medication   Hyperlipidemia LDL goal <70 Hyperlipidemia:Low fat diet discussed and encouraged.   Lipid Panel  Lab Results  Component Value Date   CHOL 130 10/19/2020   HDL 57 10/19/2020   LDLCALC 54 10/19/2020   TRIG 106 10/19/2020   CHOLHDL 2.3 10/19/2020     Controlled, no change in medication   Gout No recent flare and uric acid adequately lowered  GERD (gastroesophageal reflux disease) Controlled, no change in medication   CAD (coronary artery disease) Asymptomatic and stable, no med chnage  IDA (iron deficiency anemia) Benefiting from IV iron, managed by Nephrology

## 2021-01-22 NOTE — Assessment & Plan Note (Signed)
Controlled, no change in medication  

## 2021-01-22 NOTE — Assessment & Plan Note (Signed)
Benefiting from IV iron, managed by Nephrology

## 2021-01-22 NOTE — Assessment & Plan Note (Signed)
No recent flare and uric acid adequately lowered

## 2021-01-22 NOTE — Assessment & Plan Note (Signed)
Improved but not adequately Controlled, no change in medication DASH diet and commitment to daily physical activity for a minimum of 30 minutes discussed and encouraged, as a part of hypertension management. The importance of attaining a healthy weight is also discussed.  BP/Weight 01/18/2021 01/18/2021 01/11/2021 01/04/2021 12/07/2020 11/02/2020 6/57/8469  Systolic BP 629 528 413 244 010 - 272  Diastolic BP 56 60 60 54 75 - 80  Wt. (Lbs) - 164 166.23 - 166.25 168 168  BMI - 30.99 31.41 - 31.41 31.74 31.74

## 2021-02-01 ENCOUNTER — Encounter (HOSPITAL_COMMUNITY)
Admission: RE | Admit: 2021-02-01 | Discharge: 2021-02-01 | Disposition: A | Discharge status: Home / Self Care | Payer: Medicare Other | Source: Ambulatory Visit | Attending: Nephrology | Admitting: Nephrology

## 2021-02-01 ENCOUNTER — Other Ambulatory Visit: Payer: Self-pay

## 2021-02-01 DIAGNOSIS — D631 Anemia in chronic kidney disease: Secondary | ICD-10-CM | POA: Insufficient documentation

## 2021-02-01 DIAGNOSIS — N1832 Chronic kidney disease, stage 3b: Secondary | ICD-10-CM | POA: Insufficient documentation

## 2021-02-01 LAB — POCT HEMOGLOBIN-HEMACUE: Hemoglobin: 10.3 g/dL — ABNORMAL LOW (ref 12.0–15.0)

## 2021-02-15 ENCOUNTER — Encounter (HOSPITAL_COMMUNITY)
Admission: RE | Admit: 2021-02-15 | Discharge: 2021-02-15 | Disposition: A | Discharge status: Home / Self Care | Payer: Medicare Other | Source: Ambulatory Visit | Attending: Nephrology | Admitting: Nephrology

## 2021-02-15 ENCOUNTER — Other Ambulatory Visit: Payer: Self-pay

## 2021-02-15 DIAGNOSIS — D631 Anemia in chronic kidney disease: Secondary | ICD-10-CM | POA: Diagnosis not present

## 2021-02-15 DIAGNOSIS — N1832 Chronic kidney disease, stage 3b: Secondary | ICD-10-CM | POA: Diagnosis not present

## 2021-02-15 LAB — POCT HEMOGLOBIN-HEMACUE: Hemoglobin: 9.4 g/dL — ABNORMAL LOW (ref 12.0–15.0)

## 2021-02-15 MED ORDER — EPOETIN ALFA-EPBX 3000 UNIT/ML IJ SOLN
3000.0000 [IU] | Freq: Once | INTRAMUSCULAR | Status: AC
Start: 2021-02-15 — End: 2021-02-15
  Administered 2021-02-15: 3000 [IU] via SUBCUTANEOUS

## 2021-02-15 MED ORDER — EPOETIN ALFA-EPBX 3000 UNIT/ML IJ SOLN
INTRAMUSCULAR | Status: AC
  Filled 2021-02-15: qty 1

## 2021-02-23 ENCOUNTER — Other Ambulatory Visit: Payer: Self-pay

## 2021-02-23 ENCOUNTER — Ambulatory Visit (INDEPENDENT_AMBULATORY_CARE_PROVIDER_SITE_OTHER): Payer: Medicare Other

## 2021-02-23 DIAGNOSIS — Z23 Encounter for immunization: Secondary | ICD-10-CM | POA: Diagnosis not present

## 2021-02-24 ENCOUNTER — Other Ambulatory Visit: Payer: Self-pay | Admitting: Family Medicine

## 2021-02-28 ENCOUNTER — Encounter (INDEPENDENT_AMBULATORY_CARE_PROVIDER_SITE_OTHER): Payer: Self-pay | Admitting: Internal Medicine

## 2021-02-28 ENCOUNTER — Other Ambulatory Visit: Payer: Self-pay

## 2021-02-28 ENCOUNTER — Ambulatory Visit (INDEPENDENT_AMBULATORY_CARE_PROVIDER_SITE_OTHER): Payer: Medicare Other | Admitting: Internal Medicine

## 2021-02-28 VITALS — BP 191/68 | HR 50 | Temp 97.8°F | Ht 61.0 in | Wt 158.0 lb

## 2021-02-28 DIAGNOSIS — D500 Iron deficiency anemia secondary to blood loss (chronic): Secondary | ICD-10-CM

## 2021-02-28 DIAGNOSIS — K219 Gastro-esophageal reflux disease without esophagitis: Secondary | ICD-10-CM | POA: Diagnosis not present

## 2021-02-28 NOTE — Patient Instructions (Signed)
Discontinue ferrous sulfate for now. Colonoscopy to be scheduled after blood work reviewed(to be done tomorrow morning)

## 2021-02-28 NOTE — Progress Notes (Signed)
Presenting complaint;  Iron deficiency anemia.  Database and subjective:  Patient is 80 year old African-American female who is referred through courtesy of Dr. Theador Hawthorne of nephrology for reevaluation for iron deficiency anemia. Patient was first discovered to have iron deficiency anemia back in 2018 when she received iron infusion and she underwent EGD and colonoscopy followed by given capsule study summarized below.  She did not have any obvious source of bleeding on EGD and colonoscopy.  Small bowel given capsule study was incomplete twice as below. Patient has been maintained on p.o. iron.  She was noted to have low hemoglobin last month and received 2 more infusions of iron.  Patient also remains on p.o. iron.  She has no complaints.  She has no heartburn while on PPI.  She denies nausea vomiting dysphagia or chronic cough.  At times she has burning in her throat.  She also denies abdominal pain melena or rectal bleeding.  Her bowels move every day.  She says her appetite is good.  She has lost 6 pounds in the last 10 months.  She feels weight loss is voluntary. She also denies hematuria or vaginal bleeding. She is on low-dose aspirin but she does not take any other OTC NSAIDs. She also complains of numbness in her right thumb which started couple months ago and she does notice some difficulty using her thumb.  Occasionally she may have numbness involving tip of index and middle finger.  Her family history significant for colon carcinoma in her brother who was 74 at the time of diagnosis and died within few months.   Prior GI work-up as follows  Colonoscopy 12/21/2016 Sigmoid diverticulosis. 2 small polyps removed and these were tubular adenomas.  Esophagogastroduodenoscopy on 02/27/2018. Normal examination.  Small bowel given Stool study on 03/07/2018 Incomplete exam as capsule did not reach cecum. No abnormalities noted examined part of small bowel mucosa.  Small bowel given capsule  study on 02/05/2020 Incomplete study as capsule did not reach cecum. No mucosal abnormalities noted and segments of small bowel mucosa that were examined.   Current Medications: Outpatient Encounter Medications as of 02/28/2021  Medication Sig   acetaminophen (TYLENOL) 500 MG tablet Take 1,000 mg by mouth every 6 (six) hours as needed for mild pain or headache.    amLODipine (NORVASC) 5 MG tablet TAKE ONE TABLET BY MOUTH DAILY.   aspirin 81 MG tablet Take 1 tablet (81 mg total) by mouth daily. Restart on 03/02/18   cetirizine (ZYRTEC) 10 MG tablet TAKE ONE TABLET BY MOUTH DAILY.   ferrous sulfate 325 (65 FE) MG tablet Take 1 tablet (325 mg total) by mouth 2 (two) times daily with a meal. (Patient taking differently: Take 325 mg by mouth daily with breakfast.)   fish oil-omega-3 fatty acids 1000 MG capsule Take 1,200 mg by mouth daily.    fluticasone (FLONASE) 50 MCG/ACT nasal spray USE 2 SPRAYS IN EACH NOSTRIL ONCE DAILY. SHAKE GENTLY BEFORE USING.   hydrALAZINE (APRESOLINE) 50 MG tablet TAKE ONE TABLET BY MOUTH THREE TIMES DAILY. (Patient taking differently: 100 mg 2 (two) times daily.)   latanoprost (XALATAN) 0.005 % ophthalmic solution Place 1 drop into both eyes at bedtime. One drop each eye at bedtime   levothyroxine (SYNTHROID) 125 MCG tablet TAKE ONE TABLET BY MOUTH ONCE DAILY   metoprolol succinate (TOPROL-XL) 100 MG 24 hr tablet TAKE ONE TABLET BY MOUTH DAILY. TAKE WITH OR IMMEDIATELY FOLLOWING A MEAL.   nitroGLYCERIN (NITROSTAT) 0.4 MG SL tablet DISSOLVE ONE TABLET UNDER  TONGUE EVERY 5 MINUTES UP TO 3 DOSES AS NEEDED FOR CHEST PAIN.   olmesartan (BENICAR) 20 MG tablet Take 20 mg by mouth daily.   omeprazole (PRILOSEC) 20 MG capsule TAKE ONE CAPSULE BY MOUTH ONCE DAILY   rosuvastatin (CRESTOR) 10 MG tablet TAKE ONE TABLET BY MOUTH DAILY.   UNABLE TO FIND Shower chair x 1  DX M54.36   Misc. Devices (WALL GRAB BAR) MISC Grab bar for shower x 2 DX high fall risk (Patient not taking:  Reported on 02/28/2021)   No facility-administered encounter medications on file as of 02/28/2021.   Past Medical History:  Diagnosis Date   CAD (coronary artery disease)    Multivessel status post CABG October 2005   Carotid artery disease ([place])    CKD (chronic kidney disease), stage III ([place])    Essential hypertension    Glaucoma    Laser surgery 2010   Hyperlipidemia    Hypothyroidism    Iron deficiency anemia        Past Surgical History:  Procedure Laterality Date   COLONOSCOPY N/A 02/04/2013   Procedure: COLONOSCOPY;  Surgeon: Rogene Houston, MD;  [facility];  Service: Endoscopy;  Laterality: N/A;  1015   COLONOSCOPY N/A 12/21/2016   Procedure: COLONOSCOPY;  Surgeon: Rogene Houston, MD;  [facility];  Service: Endoscopy;  Laterality: N/A;   CORONARY ARTERY BYPASS GRAFT  October 2005   Dr. Roxy Manns - LIMA to LAD, SVG to OM 2 and OM 3, SVG to PDA   ESOPHAGOGASTRODUODENOSCOPY N/A 12/21/2016   Procedure: ESOPHAGOGASTRODUODENOSCOPY (EGD);  Surgeon: Rogene Houston, MD;  [facility];  Service: Endoscopy;  Laterality: N/A;  11:10   ESOPHAGOGASTRODUODENOSCOPY N/A 02/27/2018   Procedure: ESOPHAGOGASTRODUODENOSCOPY (EGD);  Surgeon: Rogene Houston, MD;  [facility];  Service: Endoscopy;  Laterality: N/A;   EYE SURGERY  2010   laser treatment to both eyes for glaucoma   GIVENS CAPSULE STUDY N/A 03/07/2018   Procedure: GIVENS CAPSULE STUDY;  Surgeon: Rogene Houston, MD;  [facility];  Service: Endoscopy;  Laterality: N/A;   GIVENS CAPSULE STUDY N/A 02/05/2020   Procedure: GIVENS CAPSULE STUDY;  Surgeon: Rogene Houston, MD;  [facility];  Service: Endoscopy;  Laterality: N/A;  730     Objective: Blood pressure (!) 191/68, pulse (!) 50, temperature 97.8 F (36.6 C), temperature source Oral, height _0  (1.549 m), weight 158 lb (71.7 kg). Patient is alert and in no acute distress. Conjunctiva does not appear to be  pale.  Sclera is nonicteric Oropharyngeal mucosa is normal. No neck masses or thyromegaly noted. Cardiac exam with regular rhythm normal S1 and S2.  She has faint systolic murmur at aortic area. Lungs are clear to auscultation. Abdomen is full but soft and nontender without organomegaly or masses. Rectal examination reveals soft dark brown stool and it is heme positive. No LE edema or clubbing noted.  Labs/studies Results:   CBC Latest Ref Rng & Units 02/15/2021 02/01/2021 01/18/2021  WBC 3.8 - 10.8 Thousand/uL - - -  Hemoglobin 12.0 - 15.0 g/dL 9.4(L) 10.3(L) 10.0(L)  Hematocrit 35.0 - 45.0 % - - -  Platelets 140 - 400 Thousand/uL - - -    CMP Latest Ref Rng & Units 10/19/2020 01/19/2020 01/18/2020  Glucose 65 - 99 mg/dL 86 99 86  BUN 8 - 27 mg/dL 31(H) 22 22  Creatinine 0.57 - 1.00 mg/dL 1.59(H) 1.68(H) 1.58(H)  Sodium 134 -  144 mmol/L 141 136 137  Potassium 3.5 - 5.2 mmol/L 4.6 4.4 4.2  Chloride 96 - 106 mmol/L 105 103 106  CO2 20 - 29 mmol/L 18(L) 22 25  Calcium 8.7 - 10.3 mg/dL 10.5(H) 9.8 10.3  Total Protein 6.0 - 8.5 g/dL 7.3 7.4 -  Total Bilirubin 0.0 - 1.2 mg/dL 0.3 0.4 -  Alkaline Phos 44 - 121 IU/L 113 71 -  AST 0 - 40 IU/L 20 22 -  ALT 0 - 32 IU/L 10 17 -    Hepatic Function Latest Ref Rng & Units 10/19/2020 01/19/2020 07/21/2019  Total Protein 6.0 - 8.5 g/dL 7.3 7.4 7.4  Albumin 3.7 - 4.7 g/dL 4.4 3.8 -  AST 0 - 40 IU/L _0 ALT 0 - 32 IU/L _1 Alk Phosphatase 44 - 121 IU/L 113 71 -  Total Bilirubin 0.0 - 1.2 mg/dL 0.3 0.4 0.4  Bilirubin, Direct 0.0 - 0.3 mg/dL - - -      Assessment:  #1.  Iron deficiency anemia.  She has history of iron deficiency anemia off over 4 years duration.  She has been documented to have heme positive stool on multiple occasion including today.  Prior work-up including EGD and colonoscopy did not reveal any source of GI blood loss and to given capsule studies((September 2019 and August 2021) were both incomplete.  She has chronic  GERD and heartburn is well controlled with the PPI.  She possibly has GI angiodysplasia and will make another attempt looking for source of chronic GI blood loss.  #2.  Right thumb numbness.  I wonder if she has carpal tunnel syndrome.  Patient advised follow-up with Dr. Tula Nakayama for further work-up.  #3.  History of colonic adenomas and family history of CRC in first-degree relative at late onset.   Plan:  Patient advised to discontinue p.o. iron. Diagnostic colonoscopy.  If colonoscopy is negative we will consider repeating Small given capsule study 1 more time. CT abdomen and pelvis may not be feasible given chronic kidney disease. Will review blood work that she will have tomorrow morning.

## 2021-03-01 ENCOUNTER — Encounter (HOSPITAL_COMMUNITY)
Admission: RE | Admit: 2021-03-01 | Discharge: 2021-03-01 | Disposition: A | Discharge status: Home / Self Care | Payer: Medicare Other | Source: Ambulatory Visit | Attending: Nephrology | Admitting: Nephrology

## 2021-03-01 DIAGNOSIS — N1832 Chronic kidney disease, stage 3b: Secondary | ICD-10-CM | POA: Diagnosis not present

## 2021-03-01 DIAGNOSIS — D631 Anemia in chronic kidney disease: Secondary | ICD-10-CM | POA: Diagnosis not present

## 2021-03-01 LAB — RENAL FUNCTION PANEL
Albumin: 3.7 g/dL (ref 3.5–5.0)
Anion gap: 8 (ref 5–15)
BUN: 22 mg/dL (ref 8–23)
CO2: 23 mmol/L (ref 22–32)
Calcium: 9.9 mg/dL (ref 8.9–10.3)
Chloride: 107 mmol/L (ref 98–111)
Creatinine, Ser: 1.25 mg/dL — ABNORMAL HIGH (ref 0.44–1.00)
GFR, Estimated: 44 mL/min — ABNORMAL LOW (ref >60)
Glucose, Bld: 82 mg/dL (ref 70–99)
Phosphorus: 4.2 mg/dL (ref 2.5–4.6)
Potassium: 4.3 mmol/L (ref 3.5–5.1)
Sodium: 138 mmol/L (ref 135–145)

## 2021-03-01 LAB — CBC WITH DIFFERENTIAL/PLATELET
Abs Immature Granulocytes: 0.01 10*3/uL (ref 0.00–0.07)
Basophils Absolute: 0 10*3/uL (ref 0.0–0.1)
Basophils Relative: 1 %
Eosinophils Absolute: 0.3 10*3/uL (ref 0.0–0.5)
Eosinophils Relative: 7 %
HCT: 28.7 % — ABNORMAL LOW (ref 36.0–46.0)
Hemoglobin: 9.2 g/dL — ABNORMAL LOW (ref 12.0–15.0)
Immature Granulocytes: 0 %
Lymphocytes Relative: 25 %
Lymphs Abs: 1.1 10*3/uL (ref 0.7–4.0)
MCH: 29.4 pg (ref 26.0–34.0)
MCHC: 32.1 g/dL (ref 30.0–36.0)
MCV: 91.7 fL (ref 80.0–100.0)
Monocytes Absolute: 0.3 10*3/uL (ref 0.1–1.0)
Monocytes Relative: 7 %
Neutro Abs: 2.7 10*3/uL (ref 1.7–7.7)
Neutrophils Relative %: 60 %
Platelets: 213 10*3/uL (ref 150–400)
RBC: 3.13 MIL/uL — ABNORMAL LOW (ref 3.87–5.11)
RDW: 14.1 % (ref 11.5–15.5)
WBC: 4.5 10*3/uL (ref 4.0–10.5)
nRBC: 0 % (ref 0.0–0.2)

## 2021-03-01 LAB — FERRITIN: Ferritin: 50 ng/mL (ref 11–307)

## 2021-03-01 LAB — IRON AND TIBC
Iron: 56 ug/dL (ref 28–170)
Saturation Ratios: 18 % (ref 10.4–31.8)
TIBC: 304 ug/dL (ref 250–450)
UIBC: 248 ug/dL

## 2021-03-01 LAB — PROTEIN / CREATININE RATIO, URINE
Creatinine, Urine: 38.31 mg/dL
Protein Creatinine Ratio: 0.97 mg/mg{Cre} — ABNORMAL HIGH (ref 0.00–0.15)
Total Protein, Urine: 37 mg/dL

## 2021-03-01 LAB — POCT HEMOGLOBIN-HEMACUE: Hemoglobin: 9.2 g/dL — ABNORMAL LOW (ref 12.0–15.0)

## 2021-03-01 MED ORDER — EPOETIN ALFA-EPBX 3000 UNIT/ML IJ SOLN
INTRAMUSCULAR | Status: AC
  Filled 2021-03-01: qty 1

## 2021-03-01 MED ORDER — EPOETIN ALFA-EPBX 3000 UNIT/ML IJ SOLN
3000.0000 [IU] | Freq: Once | INTRAMUSCULAR | Status: AC
Start: 2021-03-01 — End: 2021-03-01
  Administered 2021-03-01: 3000 [IU] via SUBCUTANEOUS

## 2021-03-02 ENCOUNTER — Telehealth (INDEPENDENT_AMBULATORY_CARE_PROVIDER_SITE_OTHER): Payer: Self-pay | Admitting: Internal Medicine

## 2021-03-02 NOTE — Telephone Encounter (Signed)
Reviewed blood work from yesterday with the patient  H&H has 9.2 and 28.7 MCV 91.7 Serum creatinine 1.25 Serum ferritin 56, TIBC 304 and saturation 18% Serum ferritin 50  We will proceed with diagnostic colonoscopy under [facility].

## 2021-03-07 ENCOUNTER — Telehealth (INDEPENDENT_AMBULATORY_CARE_PROVIDER_SITE_OTHER): Payer: Self-pay

## 2021-03-07 ENCOUNTER — Encounter (INDEPENDENT_AMBULATORY_CARE_PROVIDER_SITE_OTHER): Payer: Self-pay

## 2021-03-07 ENCOUNTER — Other Ambulatory Visit (INDEPENDENT_AMBULATORY_CARE_PROVIDER_SITE_OTHER): Payer: Self-pay

## 2021-03-07 DIAGNOSIS — D500 Iron deficiency anemia secondary to blood loss (chronic): Secondary | ICD-10-CM

## 2021-03-07 MED ORDER — PEG 3350-KCL-NA BICARB-NACL 420 G PO SOLR: 4000.0000 mL | ORAL | 0 refills | Status: DC

## 2021-03-07 NOTE — Telephone Encounter (Signed)
LeighAnn Jarryn Altland, CMA  

## 2021-03-13 NOTE — Patient Instructions (Signed)
[name] [name]  03/13/2021     @PREFPERIOPPHARMACY @   Your procedure is scheduled on  03/22/2021.   Report to [facility] at  0700 AM.   Call this number if you have problems the morning of surgery:  [phone]   Remember:  Follow the diet and prep instructions given to you by the office.    Take these medicines the morning of surgery with A SIP OF WATER    amlodipine, zyrtec, levothyroxine, metoprolol, prilosec.     Do not wear jewelry, make-up or nail polish.  Do not wear lotions, powders, or perfumes, or deodorant.  Do not shave 48 hours prior to surgery.  Men may shave face and neck.  Do not bring valuables to the hospital.  [facility] is not responsible for any belongings or valuables.  Contacts, dentures or bridgework may not be worn into surgery.  Leave your suitcase in the car.  After surgery it may be brought to your room.  For patients admitted to the hospital, discharge time will be determined by your treatment team.  Patients discharged the day of surgery will not be allowed to drive home and must have someone with them for 24 hours.    Special instructions:   DO NOT smoke tobacco or vape for 24 hours before your procedure.  Please read over the following fact sheets that you were given. Anesthesia Post-op Instructions and Care and Recovery After Surgery      Colonoscopy, Adult, Care After This sheet gives you information about how to care for yourself after your procedure. Your health care provider may also give you more specific instructions. If you have problems or questions, contact your health care provider. What can I expect after the procedure? After the procedure, it is common to have: A small amount of blood in your stool for 24 hours after the procedure. Some gas. Mild cramping or bloating of your abdomen. Follow these instructions at home: Eating and drinking  Drink enough fluid to keep your urine pale yellow. Follow instructions  from your health care provider about eating or drinking restrictions. Resume your normal diet as instructed by your health care provider. Avoid heavy or fried foods that are hard to digest. Activity Rest as told by your health care provider. Avoid sitting for a long time without moving. Get up to take short walks every 1-2 hours. This is important to improve blood flow and breathing. Ask for help if you feel weak or unsteady. Return to your normal activities as told by your health care provider. Ask your health care provider what activities are safe for you. Managing cramping and bloating  Try walking around when you have cramps or feel bloated. Apply heat to your abdomen as told by your health care provider. Use the heat source that your health care provider recommends, such as a moist heat pack or a heating pad. Place a towel between your skin and the heat source. Leave the heat on for 20-30 minutes. Remove the heat if your skin turns bright red. This is especially important if you are unable to feel pain, heat, or cold. You may have a greater risk of getting burned. General instructions If you were given a sedative during the procedure, it can affect you for several hours. Do not drive or operate machinery until your health care provider says that it is safe. For the first 24 hours after the procedure: Do not sign important documents. Do not  drink alcohol. Do your regular daily activities at a slower pace than normal. Eat soft foods that are easy to digest. Take over-the-counter and prescription medicines only as told by your health care provider. Keep all follow-up visits as told by your health care provider. This is important. Contact a health care provider if: You have blood in your stool 2-3 days after the procedure. Get help right away if you have: More than a small spotting of blood in your stool. Large blood clots in your stool. Swelling of your abdomen. Nausea or vomiting. A  fever. Increasing pain in your abdomen that is not relieved with medicine. Summary After the procedure, it is common to have a small amount of blood in your stool. You may also have mild cramping and bloating of your abdomen. If you were given a sedative during the procedure, it can affect you for several hours. Do not drive or operate machinery until your health care provider says that it is safe. Get help right away if you have a lot of blood in your stool, nausea or vomiting, a fever, or increased pain in your abdomen. This information is not intended to replace advice given to you by your health care provider. Make sure you discuss any questions you have with your health care provider. Document Revised: 06/12/2019 Document Reviewed: 01/12/2019 Elsevier Patient Education  [place] After This sheet gives you information about how to care for yourself after your procedure. Your health care provider may also give you more specific instructions. If you have problems or questions, contact your health care provider. What can I expect after the procedure? After the procedure, it is common to have: Tiredness. Forgetfulness about what happened after the procedure. Impaired judgment for important decisions. Nausea or vomiting. Some difficulty with balance. Follow these instructions at home: For the time period you were told by your health care provider:   Rest as needed. Do not participate in activities where you could fall or become injured. Do not drive or use machinery. Do not drink alcohol. Do not take sleeping pills or medicines that cause drowsiness. Do not make important decisions or sign legal documents. Do not take care of children on your own. Eating and drinking Follow the diet that is recommended by your health care provider. Drink enough fluid to keep your urine pale yellow. If you vomit: Drink water, juice, or soup when you can drink  without vomiting. Make sure you have little or no nausea before eating solid foods. General instructions Have a responsible adult stay with you for the time you are told. It is important to have someone help care for you until you are awake and alert. Take over-the-counter and prescription medicines only as told by your health care provider. If you have sleep apnea, surgery and certain medicines can increase your risk for breathing problems. Follow instructions from your health care provider about wearing your sleep device: Anytime you are sleeping, including during daytime naps. While taking prescription pain medicines, sleeping medicines, or medicines that make you drowsy. Avoid smoking. Keep all follow-up visits as told by your health care provider. This is important. Contact a health care provider if: You keep feeling nauseous or you keep vomiting. You feel light-headed. You are still sleepy or having trouble with balance after 24 hours. You develop a rash. You have a fever. You have redness or swelling around the IV site. Get help right away if: You have trouble breathing. You have  new-onset confusion at home. Summary For several hours after your procedure, you may feel tired. You may also be forgetful and have poor judgment. Have a responsible adult stay with you for the time you are told. It is important to have someone help care for you until you are awake and alert. Rest as told. Do not drive or operate machinery. Do not drink alcohol or take sleeping pills. Get help right away if you have trouble breathing, or if you suddenly become confused. This information is not intended to replace advice given to you by your health care provider. Make sure you discuss any questions you have with your health care provider. Document Revised: 03/03/2020 Document Reviewed: 05/21/2019 Elsevier Patient Education  2022 [employer].

## 2021-03-14 DIAGNOSIS — I5032 Chronic diastolic (congestive) heart failure: Secondary | ICD-10-CM | POA: Diagnosis not present

## 2021-03-14 DIAGNOSIS — N189 Chronic kidney disease, unspecified: Secondary | ICD-10-CM | POA: Diagnosis not present

## 2021-03-14 DIAGNOSIS — D631 Anemia in chronic kidney disease: Secondary | ICD-10-CM | POA: Diagnosis not present

## 2021-03-14 DIAGNOSIS — R809 Proteinuria, unspecified: Secondary | ICD-10-CM | POA: Diagnosis not present

## 2021-03-14 DIAGNOSIS — I129 Hypertensive chronic kidney disease with stage 1 through stage 4 chronic kidney disease, or unspecified chronic kidney disease: Secondary | ICD-10-CM | POA: Diagnosis not present

## 2021-03-14 DIAGNOSIS — N1832 Chronic kidney disease, stage 3b: Secondary | ICD-10-CM | POA: Diagnosis not present

## 2021-03-15 ENCOUNTER — Encounter (HOSPITAL_COMMUNITY)
Admission: RE | Admit: 2021-03-15 | Discharge: 2021-03-15 | Disposition: A | Discharge status: Home / Self Care | Payer: Medicare Other | Source: Ambulatory Visit | Attending: Internal Medicine | Admitting: Internal Medicine

## 2021-03-15 ENCOUNTER — Encounter (HOSPITAL_COMMUNITY)
Admission: RE | Admit: 2021-03-15 | Discharge: 2021-03-15 | Disposition: A | Discharge status: Home / Self Care | Payer: Medicare Other | Source: Ambulatory Visit | Attending: Nephrology | Admitting: Nephrology

## 2021-03-15 ENCOUNTER — Other Ambulatory Visit: Payer: Self-pay

## 2021-03-15 DIAGNOSIS — Z01818 Encounter for other preprocedural examination: Secondary | ICD-10-CM | POA: Insufficient documentation

## 2021-03-15 DIAGNOSIS — D500 Iron deficiency anemia secondary to blood loss (chronic): Secondary | ICD-10-CM

## 2021-03-15 DIAGNOSIS — D631 Anemia in chronic kidney disease: Secondary | ICD-10-CM | POA: Insufficient documentation

## 2021-03-15 DIAGNOSIS — N1832 Chronic kidney disease, stage 3b: Secondary | ICD-10-CM | POA: Insufficient documentation

## 2021-03-15 LAB — CBC WITH DIFFERENTIAL/PLATELET
Abs Immature Granulocytes: 0.02 10*3/uL (ref 0.00–0.07)
Basophils Absolute: 0.1 10*3/uL (ref 0.0–0.1)
Basophils Relative: 1 %
Eosinophils Absolute: 0.3 10*3/uL (ref 0.0–0.5)
Eosinophils Relative: 6 %
HCT: 27.6 % — ABNORMAL LOW (ref 36.0–46.0)
Hemoglobin: 8.5 g/dL — ABNORMAL LOW (ref 12.0–15.0)
Immature Granulocytes: 0 %
Lymphocytes Relative: 21 %
Lymphs Abs: 1.1 10*3/uL (ref 0.7–4.0)
MCH: 28.8 pg (ref 26.0–34.0)
MCHC: 30.8 g/dL (ref 30.0–36.0)
MCV: 93.6 fL (ref 80.0–100.0)
Monocytes Absolute: 0.5 10*3/uL (ref 0.1–1.0)
Monocytes Relative: 8 %
Neutro Abs: 3.5 10*3/uL (ref 1.7–7.7)
Neutrophils Relative %: 64 %
Platelets: 243 10*3/uL (ref 150–400)
RBC: 2.95 MIL/uL — ABNORMAL LOW (ref 3.87–5.11)
RDW: 14.8 % (ref 11.5–15.5)
WBC: 5.5 10*3/uL (ref 4.0–10.5)
nRBC: 0 % (ref 0.0–0.2)

## 2021-03-15 LAB — BASIC METABOLIC PANEL
Anion gap: 7 (ref 5–15)
BUN: 35 mg/dL — ABNORMAL HIGH (ref 8–23)
CO2: 23 mmol/L (ref 22–32)
Calcium: 9.8 mg/dL (ref 8.9–10.3)
Chloride: 109 mmol/L (ref 98–111)
Creatinine, Ser: 1.48 mg/dL — ABNORMAL HIGH (ref 0.44–1.00)
GFR, Estimated: 36 mL/min — ABNORMAL LOW (ref >60)
Glucose, Bld: 96 mg/dL (ref 70–99)
Potassium: 4 mmol/L (ref 3.5–5.1)
Sodium: 139 mmol/L (ref 135–145)

## 2021-03-15 LAB — POCT HEMOGLOBIN-HEMACUE: Hemoglobin: 8.5 g/dL — ABNORMAL LOW (ref 12.0–15.0)

## 2021-03-15 MED ORDER — EPOETIN ALFA-EPBX 3000 UNIT/ML IJ SOLN
3000.0000 [IU] | Freq: Once | INTRAMUSCULAR | Status: AC
Start: 2021-03-15 — End: 2021-03-15
  Administered 2021-03-15: 3000 [IU] via SUBCUTANEOUS

## 2021-03-15 MED ORDER — EPOETIN ALFA-EPBX 3000 UNIT/ML IJ SOLN
INTRAMUSCULAR | Status: AC
  Filled 2021-03-15: qty 1

## 2021-03-15 NOTE — Pre-Procedure Instructions (Signed)
Dr Charna Elizabeth aware of H&H, No orders given.

## 2021-03-20 ENCOUNTER — Encounter (HOSPITAL_COMMUNITY): Payer: Medicare Other

## 2021-03-22 ENCOUNTER — Encounter (HOSPITAL_COMMUNITY): Payer: Self-pay | Admitting: Internal Medicine

## 2021-03-22 ENCOUNTER — Ambulatory Visit (HOSPITAL_COMMUNITY)
Admission: RE | Admit: 2021-03-22 | Discharge: 2021-03-22 | Disposition: A | Discharge status: Home / Self Care | Payer: Medicare Other | Attending: Internal Medicine | Admitting: Internal Medicine

## 2021-03-22 ENCOUNTER — Encounter (HOSPITAL_COMMUNITY)
Admission: RE | Disposition: A | Discharge status: Home / Self Care | Payer: Self-pay | Source: Home / Self Care | Attending: Internal Medicine

## 2021-03-22 ENCOUNTER — Ambulatory Visit (HOSPITAL_COMMUNITY): Payer: Medicare Other | Admitting: Anesthesiology

## 2021-03-22 ENCOUNTER — Other Ambulatory Visit: Payer: Self-pay

## 2021-03-22 DIAGNOSIS — K5730 Diverticulosis of large intestine without perforation or abscess without bleeding: Secondary | ICD-10-CM | POA: Insufficient documentation

## 2021-03-22 DIAGNOSIS — K644 Residual hemorrhoidal skin tags: Secondary | ICD-10-CM | POA: Insufficient documentation

## 2021-03-22 DIAGNOSIS — Z79890 Hormone replacement therapy (postmenopausal): Secondary | ICD-10-CM | POA: Diagnosis not present

## 2021-03-22 DIAGNOSIS — E785 Hyperlipidemia, unspecified: Secondary | ICD-10-CM | POA: Diagnosis not present

## 2021-03-22 DIAGNOSIS — I2510 Atherosclerotic heart disease of native coronary artery without angina pectoris: Secondary | ICD-10-CM | POA: Diagnosis not present

## 2021-03-22 DIAGNOSIS — E039 Hypothyroidism, unspecified: Secondary | ICD-10-CM | POA: Diagnosis not present

## 2021-03-22 DIAGNOSIS — Z800 Family history of malignant neoplasm of digestive organs: Secondary | ICD-10-CM | POA: Diagnosis not present

## 2021-03-22 DIAGNOSIS — Z951 Presence of aortocoronary bypass graft: Secondary | ICD-10-CM | POA: Diagnosis not present

## 2021-03-22 DIAGNOSIS — Z841 Family history of disorders of kidney and ureter: Secondary | ICD-10-CM | POA: Diagnosis not present

## 2021-03-22 DIAGNOSIS — D500 Iron deficiency anemia secondary to blood loss (chronic): Secondary | ICD-10-CM | POA: Diagnosis not present

## 2021-03-22 DIAGNOSIS — Z888 Allergy status to other drugs, medicaments and biological substances status: Secondary | ICD-10-CM | POA: Diagnosis not present

## 2021-03-22 DIAGNOSIS — K2289 Other specified disease of esophagus: Secondary | ICD-10-CM | POA: Diagnosis not present

## 2021-03-22 DIAGNOSIS — Z8379 Family history of other diseases of the digestive system: Secondary | ICD-10-CM | POA: Diagnosis not present

## 2021-03-22 DIAGNOSIS — Z8249 Family history of ischemic heart disease and other diseases of the circulatory system: Secondary | ICD-10-CM | POA: Diagnosis not present

## 2021-03-22 DIAGNOSIS — E669 Obesity, unspecified: Secondary | ICD-10-CM | POA: Insufficient documentation

## 2021-03-22 DIAGNOSIS — I129 Hypertensive chronic kidney disease with stage 1 through stage 4 chronic kidney disease, or unspecified chronic kidney disease: Secondary | ICD-10-CM | POA: Diagnosis not present

## 2021-03-22 DIAGNOSIS — Z79899 Other long term (current) drug therapy: Secondary | ICD-10-CM | POA: Insufficient documentation

## 2021-03-22 DIAGNOSIS — Z885 Allergy status to narcotic agent status: Secondary | ICD-10-CM | POA: Diagnosis not present

## 2021-03-22 DIAGNOSIS — D123 Benign neoplasm of transverse colon: Secondary | ICD-10-CM | POA: Diagnosis not present

## 2021-03-22 DIAGNOSIS — Z7982 Long term (current) use of aspirin: Secondary | ICD-10-CM | POA: Diagnosis not present

## 2021-03-22 DIAGNOSIS — N183 Chronic kidney disease, stage 3 unspecified: Secondary | ICD-10-CM | POA: Diagnosis not present

## 2021-03-22 DIAGNOSIS — Z803 Family history of malignant neoplasm of breast: Secondary | ICD-10-CM | POA: Diagnosis not present

## 2021-03-22 DIAGNOSIS — Z6831 Body mass index (BMI) 31.0-31.9, adult: Secondary | ICD-10-CM | POA: Diagnosis not present

## 2021-03-22 DIAGNOSIS — K3189 Other diseases of stomach and duodenum: Secondary | ICD-10-CM | POA: Diagnosis not present

## 2021-03-22 DIAGNOSIS — K317 Polyp of stomach and duodenum: Secondary | ICD-10-CM | POA: Diagnosis not present

## 2021-03-22 DIAGNOSIS — Z87891 Personal history of nicotine dependence: Secondary | ICD-10-CM | POA: Insufficient documentation

## 2021-03-22 DIAGNOSIS — K635 Polyp of colon: Secondary | ICD-10-CM | POA: Diagnosis not present

## 2021-03-22 HISTORY — PX: POLYPECTOMY: SHX5525

## 2021-03-22 HISTORY — PX: COLONOSCOPY WITH PROPOFOL: SHX5780

## 2021-03-22 LAB — HEMOGLOBIN AND HEMATOCRIT, BLOOD
HCT: 30.2 % — ABNORMAL LOW (ref 36.0–46.0)
Hemoglobin: 9.4 g/dL — ABNORMAL LOW (ref 12.0–15.0)

## 2021-03-22 LAB — HM COLONOSCOPY

## 2021-03-22 SURGERY — COLONOSCOPY WITH PROPOFOL
Anesthesia: General

## 2021-03-22 MED ORDER — PROPOFOL 500 MG/50ML IV EMUL
INTRAVENOUS | Status: DC | PRN
  Administered 2021-03-22: 100 ug/kg/min via INTRAVENOUS

## 2021-03-22 MED ORDER — ASPIRIN 81 MG PO TABS: 81.0000 mg | ORAL_TABLET | Freq: Every day | ORAL | 0 refills | Status: DC

## 2021-03-22 MED ORDER — EPHEDRINE SULFATE-NACL 50-0.9 MG/10ML-% IV SOSY
PREFILLED_SYRINGE | INTRAVENOUS | Status: DC | PRN
  Administered 2021-03-22 (×3): 5 mg via INTRAVENOUS

## 2021-03-22 MED ORDER — PHENYLEPHRINE 40 MCG/ML (10ML) SYRINGE FOR IV PUSH (FOR BLOOD PRESSURE SUPPORT)
PREFILLED_SYRINGE | INTRAVENOUS | Status: DC | PRN
  Administered 2021-03-22 (×3): 80 ug via INTRAVENOUS

## 2021-03-22 MED ORDER — LACTATED RINGERS IV SOLN
INTRAVENOUS | Status: DC
  Administered 2021-03-22: 1000 mL via INTRAVENOUS

## 2021-03-22 MED ORDER — PROPOFOL 10 MG/ML IV BOLUS
INTRAVENOUS | Status: DC | PRN
  Administered 2021-03-22: 100 mg via INTRAVENOUS

## 2021-03-22 MED ORDER — STERILE WATER FOR IRRIGATION IR SOLN
Status: DC | PRN
  Administered 2021-03-22: 100 mL

## 2021-03-22 MED ORDER — EPHEDRINE 5 MG/ML INJ
INTRAVENOUS | Status: AC
  Filled 2021-03-22: qty 5

## 2021-03-22 MED ORDER — LIDOCAINE HCL (CARDIAC) PF 100 MG/5ML IV SOSY
PREFILLED_SYRINGE | INTRAVENOUS | Status: DC | PRN
  Administered 2021-03-22: 50 mg via INTRAVENOUS

## 2021-03-22 MED ORDER — PHENYLEPHRINE 40 MCG/ML (10ML) SYRINGE FOR IV PUSH (FOR BLOOD PRESSURE SUPPORT)
PREFILLED_SYRINGE | INTRAVENOUS | Status: AC
  Filled 2021-03-22: qty 10

## 2021-03-22 NOTE — Op Note (Signed)
[facility] Patient Name: [name] [name] Procedure Date: 03/22/2021 8:03 AM MRN: [id number] Date of Birth: [birth year] Attending MD: Hildred Laser , MD CSN: [id number] Age: 80 Admit Type: Outpatient Procedure:                Colonoscopy Indications:              Iron deficiency anemia secondary to chronic blood                            loss Providers:                Hildred Laser, MD, Rosina Lowenstein, RN, Nelma Rothman,                            Technician Referring MD:             Norwood Levo. Moshe Cipro, MD Medicines:                Propofol per Anesthesia Complications:            No immediate complications. Estimated Blood Loss:     Estimated blood loss was minimal. Procedure:                After obtaining informed consent, the colonoscope                            was passed under direct vision. Throughout the                            procedure, the patient's blood pressure, pulse, and                            oxygen saturations were monitored continuously. The                            [id number]-HQ190L ([id number]) scope was introduced through                            the anus and advanced to the the cecum, identified                            by appendiceal orifice and ileocecal valve. The                            colonoscopy was performed without difficulty. The                            patient tolerated the procedure well. The quality                            of the bowel preparation was good. The ileocecal                            valve, appendiceal orifice, and rectum were  photographed. Scope In: 8:35:20 AM Scope Out: 8:57:49 AM Scope Withdrawal Time: 0 hours 6 minutes 15 seconds  Total Procedure Duration: 0 hours 22 minutes 29 seconds  Findings:      The perianal and digital rectal examinations were normal.      Two sessile polyps were found in the transverse colon and hepatic       flexure. The polyps were small in size. These polyps  were removed with a       cold snare. Resection and retrieval were complete. The pathology       specimen was placed into Bottle Number 1.      A diminutive polyp was found in the hepatic flexure. The polyp was       sessile. Biopsies were taken with a cold forceps for histology. The       pathology specimen was placed into Bottle Number 1.      A 5 mm polyp was found in the hepatic flexure. The polyp was flat. The       polyp was removed with a cold snare. Resection and retrieval were       complete. The pathology specimen was placed into Bottle Number 2.      Scattered diverticula were found in the sigmoid colon.      External hemorrhoids were found during retroflexion. The hemorrhoids       were small. Impression:               - Two small polyps in the transverse colon and at                            the hepatic flexure, removed with a cold snare.                            Resected and retrieved.                           - One diminutive polyp at the hepatic flexure.                            Biopsied.                           - One 5 mm polyp at the hepatic flexure, removed                            with a cold snare. Resected and retrieved.                           - Diverticulosis in the sigmoid colon.                           - External hemorrhoids. Moderate Sedation:      Per Anesthesia Care Recommendation:           - Patient has a contact number available for                            emergencies. The signs and symptoms of potential  delayed complications were discussed with the                            patient. Return to normal activities tomorrow.                            Written discharge instructions were provided to the                            patient.                           - High fiber diet today.                           - Continue present medications.                           - No aspirin, ibuprofen, naproxen, or other                             non-steroidal anti-inflammatory drugs for 1 day.                           - Await pathology results.                           - Repeat colonoscopy in 5 years for surveillance. Procedure Code(s):        --- Professional ---                           [phone], Colonoscopy, flexible; with removal of                            tumor(s), polyp(s), or other lesion(s) by snare                            technique                           [id number], 32, Colonoscopy, flexible; with biopsy,                            single or multiple Diagnosis Code(s):        --- Professional ---                           K63.5, Polyp of colon                           K64.4, Residual hemorrhoidal skin tags                           D50.0, Iron deficiency anemia secondary to blood                            loss (chronic)  K57.30, Diverticulosis of large intestine without                            perforation or abscess without bleeding CPT copyright 2019 American Medical Association. All rights reserved. The codes documented in this report are preliminary and upon coder review may  be revised to meet current compliance requirements. Hildred Laser, MD Hildred Laser, MD 03/22/2021 9:18:20 AM This report has been signed electronically. Number of Addenda: 0

## 2021-03-22 NOTE — Op Note (Signed)
[facility] Patient Name: [name] [name] Procedure Date: 03/22/2021 8:14 AM MRN: [id number] Date of Birth: March 14, 1941 Attending MD: Hildred Laser , MD CSN: [id number] Age: 80 Admit Type: Outpatient Procedure:                Upper GI endoscopy Indications:              Iron deficiency anemia secondary to chronic blood                            loss Providers:                Hildred Laser, MD, Rosina Lowenstein, RN, Nelma Rothman,                            Technician Referring MD:             Norwood Levo. Moshe Cipro, MD Medicines:                Propofol per Anesthesia Complications:            No immediate complications. Estimated Blood Loss:     Estimated blood loss: none. Procedure:                Pre-Anesthesia Assessment:                           - Prior to the procedure, a History and Physical                            was performed, and patient medications and                            allergies were reviewed. The patient's tolerance of                            previous anesthesia was also reviewed. The risks                            and benefits of the procedure and the sedation                            options and risks were discussed with the patient.                            All questions were answered, and informed consent                            was obtained. Prior Anticoagulants: The patient has                            taken no previous anticoagulant or antiplatelet                            agents except for aspirin. ASA Grade Assessment:  III - A patient with severe systemic disease. After                            reviewing the risks and benefits, the patient was                            deemed in satisfactory condition to undergo the                            procedure.                           After obtaining informed consent, the endoscope was                            passed under direct vision. Throughout the                             procedure, the patient's blood pressure, pulse, and                            oxygen saturations were monitored continuously. The                            [id number]-H190 ([id number]) scope was introduced through the                            mouth, and advanced to the second part of duodenum.                            The upper GI endoscopy was accomplished without                            difficulty. The patient tolerated the procedure                            well. Scope In: 8:25:07 AM Scope Out: 8:31:07 AM Total Procedure Duration: 0 hours 6 minutes 0 seconds  Findings:      The hypopharynx was normal.      The examined esophagus was normal.      The Z-line was irregular and was found 40 cm from the incisors.      A few diminutive sessile polyps with no stigmata of recent bleeding were       found in the gastric body.      The exam of the stomach was otherwise normal.      The duodenal bulb was normal.      A medium-sized submucosal lesion with no bleeding was found in the       second portion of the duodenum. It felt firm on palpation with biopsy       foreceps.      Diffuse mild mucosal pigmentation were found in the second portion of       the duodenum. Impression:               - Normal hypopharynx.                           -  Normal esophagus.                           - Z-line irregular, 40 cm from the incisors.                           - A few gastric polyps.                           - Normal duodenal bulb.                           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                            Size about 12 mm.                           - duodenal hemosiderosis.                           - No specimens collected. Moderate Sedation:      Per Anesthesia Care Recommendation:           - Patient has a contact number available for                            emergencies. The signs and symptoms of potential                            delayed  complications were discussed with the                            patient. Return to normal activities tomorrow.                            Written discharge instructions were provided to the                            patient.                           - Resume previous diet today.                           - Continue present medications.                           - Schedule EUS to assess duodenal lesion.                           - See the other procedure note for documentation of                            additional recommendations. Procedure Code(s):        --- Professional ---  97741, Esophagogastroduodenoscopy, flexible,                            transoral; diagnostic, including collection of                            specimen(s) by brushing or washing, when performed                            (separate procedure) Diagnosis Code(s):        --- Professional ---                           K22.8, Other specified diseases of esophagus                           K31.7, Polyp of stomach and duodenum                           K31.89, Other diseases of stomach and duodenum                           D50.0, Iron deficiency anemia secondary to blood                            loss (chronic) CPT copyright 2019 American Medical Association. All rights reserved. The codes documented in this report are preliminary and upon coder review may  be revised to meet current compliance requirements. Hildred Laser, MD Hildred Laser, MD 03/22/2021 9:12:50 AM This report has been signed electronically. Number of Addenda: 0

## 2021-03-22 NOTE — H&P (Signed)
[name] [name] is an 80 y.o. female.   Chief Complaint: Patient is here for esophagogastroduodenoscopy and colonoscopy. HPI: Patient is 80 year old African-American female who has a history of iron deficiency anemia secondary to GI bleeding who is work-up in the past has been negative as summarized below.  She was seen in the office few weeks ago for drop in H&H and noted to have heme positive stool.  She also has a history of colonic adenomas.  Her hemoglobin last week was 8.5 g.  Today is 9.4.  She has been receiving iron infusion periodically.  She had 2 doses in July 2022.  She denies heartburn dysphagia nausea vomiting abdominal pain or rectal bleeding.  Her appetite is good.  Her weight has been stable. Family history is positive for CRC in brother who was 37 at the time of diagnosis. Last aspirin dose was 1 week ago.   Prior GI work-up as follows   Colonoscopy 12/21/2016 Sigmoid diverticulosis. 2 small polyps removed and these were tubular adenomas.   Esophagogastroduodenoscopy on 02/27/2018. Normal examination.   Small bowel given Stool study on 03/07/2018 Incomplete exam as capsule did not reach cecum. No abnormalities noted examined part of small bowel mucosa.   Small bowel given capsule study on 02/05/2020 Incomplete study as capsule did not reach cecum. No mucosal abnormalities noted and segments of small bowel mucosa that were examined.     Past Medical History:  Diagnosis Date   CAD (coronary artery disease)    Multivessel status post CABG October 2005   Carotid artery disease ([place])    CKD (chronic kidney disease), stage III ([place])    Essential hypertension    Glaucoma    Laser surgery 2010   Hyperlipidemia    Hypothyroidism    Iron deficiency anemia    Obesity     Past Surgical History:  Procedure Laterality Date   COLONOSCOPY N/A 02/04/2013   Procedure: COLONOSCOPY;  Surgeon: Rogene Houston, MD;  [facility];  Service: Endoscopy;  Laterality: N/A;   1015   COLONOSCOPY N/A 12/21/2016   Procedure: COLONOSCOPY;  Surgeon: Rogene Houston, MD;  [facility]: [facility];  Service: Endoscopy;  Laterality: N/A;   CORONARY ARTERY BYPASS GRAFT  October 2005   Dr. Roxy Manns - LIMA to LAD, SVG to OM 2 and OM 3, SVG to PDA   ESOPHAGOGASTRODUODENOSCOPY N/A 12/21/2016   Procedure: ESOPHAGOGASTRODUODENOSCOPY (EGD);  Surgeon: Rogene Houston, MD;  [facility];  Service: Endoscopy;  Laterality: N/A;  11:10   ESOPHAGOGASTRODUODENOSCOPY N/A 02/27/2018   Procedure: ESOPHAGOGASTRODUODENOSCOPY (EGD);  Surgeon: Rogene Houston, MD;  [facility];  Service: Endoscopy;  Laterality: N/A;   EYE SURGERY  2010   laser treatment to both eyes for glaucoma   GIVENS CAPSULE STUDY N/A 03/07/2018   Procedure: GIVENS CAPSULE STUDY;  Surgeon: Rogene Houston, MD;  [facility];  Service: Endoscopy;  Laterality: N/A;   GIVENS CAPSULE STUDY N/A 02/05/2020   Procedure: GIVENS CAPSULE STUDY;  Surgeon: Rogene Houston, MD;  [facility];  Service: Endoscopy;  Laterality: N/A;  45    Family History  Problem Relation Age of Onset   Aneurysm Mother    Hypertension Mother    Coronary artery disease Mother    Coronary artery disease Father    Diabetes Son    Pancreatic cancer Son    Colon cancer Brother 32       lived 46 months afterwards  Liver cancer Brother 24   Diabetes Sister    Coronary artery disease Sister    Diverticulosis Sister    COPD Sister    Kidney disease Sister    Diabetes Sister    Hypertension Sister    Breast cancer Sister 36   Hearing loss Sister    Heart Problems Sister        pacemaker   Social History:  reports that she quit smoking about 17 years ago. Her smoking use included cigarettes. She has a 10.00 pack-year smoking history. She has never used smokeless tobacco. She reports that she does not drink alcohol and does not use drugs.  Allergies:  Allergies  Allergen Reactions   Tramadol Itching    Lisinopril Cough   Clonidine Derivatives Other (See Comments)    Zombie BP dropped    Medications Prior to Admission  Medication Sig Dispense Refill   amLODipine (NORVASC) 5 MG tablet TAKE ONE TABLET BY MOUTH DAILY. 90 tablet 1   aspirin 81 MG tablet Take 1 tablet (81 mg total) by mouth daily. Restart on 03/02/18 30 tablet 0   cetirizine (ZYRTEC) 10 MG tablet TAKE ONE TABLET BY MOUTH DAILY. 90 tablet 1   fish oil-omega-3 fatty acids 1000 MG capsule Take 1 g by mouth daily.     fluticasone (FLONASE) 50 MCG/ACT nasal spray USE 2 SPRAYS IN EACH NOSTRIL ONCE DAILY. SHAKE GENTLY BEFORE USING. (Patient taking differently: Place 1 spray into both nostrils daily as needed for allergies.) 48 g 1   hydrALAZINE (APRESOLINE) 100 MG tablet Take 100 mg by mouth 2 (two) times daily.     latanoprost (XALATAN) 0.005 % ophthalmic solution Place 1 drop into both eyes at bedtime.     levothyroxine (SYNTHROID) 125 MCG tablet TAKE ONE TABLET BY MOUTH ONCE DAILY 90 tablet 1   metoprolol succinate (TOPROL-XL) 100 MG 24 hr tablet TAKE ONE TABLET BY MOUTH DAILY. TAKE WITH OR IMMEDIATELY FOLLOWING A MEAL. 90 tablet 1   olmesartan (BENICAR) 20 MG tablet Take 20 mg by mouth daily.     omeprazole (PRILOSEC) 20 MG capsule TAKE ONE CAPSULE BY MOUTH ONCE DAILY 90 capsule 3   rosuvastatin (CRESTOR) 10 MG tablet TAKE ONE TABLET BY MOUTH DAILY. 90 tablet 3   acetaminophen (TYLENOL) 500 MG tablet Take 1,000 mg by mouth every 6 (six) hours as needed for mild pain or headache.      hydrALAZINE (APRESOLINE) 50 MG tablet TAKE ONE TABLET BY MOUTH THREE TIMES DAILY. (Patient not taking: No sig reported) 90 tablet 3   Misc. Devices (WALL GRAB BAR) MISC Grab bar for shower x 2 DX high fall risk (Patient not taking: No sig reported) 2 each 0   nitroGLYCERIN (NITROSTAT) 0.4 MG SL tablet DISSOLVE ONE TABLET UNDER TONGUE EVERY 5 MINUTES UP TO 3 DOSES AS NEEDED FOR CHEST PAIN. 25 tablet 3   polyethylene glycol-electrolytes (TRILYTE) 420 g  solution Take 4,000 mLs by mouth as directed. 4000 mL 0   UNABLE TO FIND Shower chair x 1  DX M54.36 1 each 0    Results for orders placed or performed during the hospital encounter of 03/22/21 (from the past 48 hour(s))  Hemoglobin and hematocrit, blood     Status: Abnormal   Collection Time: 03/22/21  7:00 AM  Result Value Ref Range   Hemoglobin 9.4 (L) 12.0 - 15.0 g/dL   HCT 30.2 (L) 36.0 - 46.0 %    Comment: Performed at [facility], [address]  [place], [place] [address]   No results found.  Review of Systems  Blood pressure (!) 184/59, pulse (!) 58, temperature 97.9 F (36.6 C), temperature source Oral, resp. rate 16, SpO2 98 %. Physical Exam HENT:     Mouth/Throat:     Mouth: Mucous membranes are moist.     Pharynx: Oropharynx is clear.  Eyes:     General: No scleral icterus.    Comments: Conjunctiva is pale.  Cardiovascular:     Rate and Rhythm: Normal rate and regular rhythm.     Heart sounds: Murmur heard.     Comments: Grade 2/6 systolic murmur best heard at aortic area Pulmonary:     Effort: Pulmonary effort is normal.     Breath sounds: Normal breath sounds.  Abdominal:     General: There is no distension.     Palpations: Abdomen is soft. There is no mass.     Tenderness: There is no abdominal tenderness.  Musculoskeletal:        General: No swelling.     Cervical back: Neck supple.  Lymphadenopathy:     Cervical: No cervical adenopathy.  Skin:    General: Skin is warm and dry.  Neurological:     Mental Status: She is alert.     Assessment/Plan  Iron deficiency anemia secondary to chronic GI blood loss. Diagnostic esophagogastroduodenoscopy and colonoscopy.  Hildred Laser, MD 03/22/2021, 8:15 AM

## 2021-03-22 NOTE — Transfer of Care (Signed)
Immediate Anesthesia Transfer of Care Note  Patient: [name] [name]  Procedure(s) Performed: COLONOSCOPY WITH PROPOFOL POLYPECTOMY  Patient Location: Short Stay  Anesthesia Type:General  Level of Consciousness: awake  Airway & Oxygen Therapy: Patient Spontanous Breathing  Post-op Assessment: Report given to RN and Post -op Vital signs reviewed and stable  Post vital signs: Reviewed and stable  Last Vitals:  Vitals Value Taken Time  BP    Temp    Pulse    Resp    SpO2      Last Pain:  Vitals:   03/22/21 0819  TempSrc:   PainSc: 0-No pain      Patients Stated Pain Goal: 5 (87/19/94 1290)  Complications: No notable events documented.

## 2021-03-22 NOTE — Anesthesia Postprocedure Evaluation (Signed)
Anesthesia Post Note  Patient: [name] [name]  Procedure(s) Performed: COLONOSCOPY WITH PROPOFOL POLYPECTOMY  Patient location during evaluation: Phase II Anesthesia Type: General Level of consciousness: awake Pain management: pain level controlled Vital Signs Assessment: post-procedure vital signs reviewed and stable Respiratory status: spontaneous breathing and respiratory function stable Cardiovascular status: blood pressure returned to baseline and stable Postop Assessment: no headache and no apparent nausea or vomiting Anesthetic complications: no Comments: Late entry   No notable events documented.   Last Vitals:  Vitals:   03/22/21 0718 03/22/21 0902  BP: (!) 184/59 (!) 128/41  Pulse: (!) 58 (!) 59  Resp: 16 17  Temp: 36.6 C 36.6 C  SpO2: 98% 100%    Last Pain:  Vitals:   03/22/21 0902  TempSrc:   PainSc: 0-No pain                 Louann Sjogren

## 2021-03-22 NOTE — Discharge Instructions (Signed)
Resume aspirin on 03/23/2021 Resume other medications as before. High-fiber diet. No driving for 24 hours. Physician will call with biopsy results.

## 2021-03-22 NOTE — Anesthesia Preprocedure Evaluation (Signed)
Anesthesia Evaluation  Patient identified by MRN, date of birth, ID band Patient awake    Reviewed: Allergy & Precautions, H&P , NPO status , Patient's Chart, lab work & pertinent test results, reviewed documented beta blocker date and time   Airway Mallampati: II  TM Distance: >3 FB Neck ROM: full    Dental no notable dental hx.    Pulmonary neg pulmonary ROS, former smoker,    Pulmonary exam normal breath sounds clear to auscultation       Cardiovascular Exercise Tolerance: Good hypertension, + CAD and + CABG   Rhythm:regular Rate:Normal     Neuro/Psych  Neuromuscular disease negative psych ROS   GI/Hepatic Neg liver ROS, GERD  Medicated,  Endo/Other  Hypothyroidism   Renal/GU CRFRenal disease  negative genitourinary   Musculoskeletal   Abdominal   Peds  Hematology  (+) Blood dyscrasia, anemia ,   Anesthesia Other Findings   Reproductive/Obstetrics negative OB ROS                             Anesthesia Physical Anesthesia Plan  ASA: 3  Anesthesia Plan: General   Post-op Pain Management:    Induction:   PONV Risk Score and Plan: Propofol infusion  Airway Management Planned:   Additional Equipment:   Intra-op Plan:   Post-operative Plan:   Informed Consent: I have reviewed the patients History and Physical, chart, labs and discussed the procedure including the risks, benefits and alternatives for the proposed anesthesia with the patient or authorized representative who has indicated his/her understanding and acceptance.     Dental Advisory Given  Plan Discussed with: CRNA  Anesthesia Plan Comments:         Anesthesia Quick Evaluation

## 2021-03-22 NOTE — Anesthesia Procedure Notes (Signed)
Date/Time: 03/22/2021 8:26 AM Performed by: Orlie Dakin, CRNA Pre-anesthesia Checklist: Patient identified, Emergency Drugs available, Suction available and Patient being monitored Patient Re-evaluated:Patient Re-evaluated prior to induction Oxygen Delivery Method: Nasal cannula Induction Type: IV induction Placement Confirmation: positive ETCO2

## 2021-03-23 ENCOUNTER — Telehealth: Payer: Self-pay

## 2021-03-23 NOTE — Telephone Encounter (Signed)
-----   Message from Milus Banister, MD sent at 03/23/2021 10:30 AM EDT ----- Regarding: RE: EUS Not sure it needs to be removed or even if it can be safely removed endoscopically but first step is EUS evaluation with biopsy.  Tiaria Biby, She needs upper EUS evaluation with either myself or Gabe, first available spot. For duodenal lesion.  Thanks   ----- Message ----- From: Timothy Lasso, RN Sent: 03/23/2021  10:11 AM EDT To: Milus Banister, MD, Worthy Keeler, # Subject: Melton Alar: EUS                                        Please advise ----- Message ----- From: Worthy Keeler Sent: 03/23/2021   9:56 AM EDT To: Timothy Lasso, RN Subject: EUS                                            Per Dr Laural Golden patient needs EUS  & removal of submucosal lesion in duodenal. Thanks

## 2021-03-24 ENCOUNTER — Other Ambulatory Visit: Payer: Self-pay

## 2021-03-24 DIAGNOSIS — K3189 Other diseases of stomach and duodenum: Secondary | ICD-10-CM

## 2021-03-24 LAB — SURGICAL PATHOLOGY

## 2021-03-24 NOTE — Telephone Encounter (Signed)
Error appt time is 1230 pm to accommodate clean up time

## 2021-03-24 NOTE — Telephone Encounter (Signed)
The pt has been scheduled for EUS for duodenal lesion at [facility] with DJ at 1130 am on 10/27

## 2021-03-24 NOTE — Telephone Encounter (Signed)
Left message on machine to call back  

## 2021-03-27 ENCOUNTER — Encounter (INDEPENDENT_AMBULATORY_CARE_PROVIDER_SITE_OTHER): Payer: Self-pay | Admitting: *Deleted

## 2021-03-27 ENCOUNTER — Other Ambulatory Visit: Payer: Self-pay | Admitting: Family Medicine

## 2021-03-27 NOTE — Telephone Encounter (Signed)
EUS scheduled, pt instructed and medications reviewed.  Patient instructions mailed to home.  Patient to call with any questions or concerns.  

## 2021-03-28 ENCOUNTER — Encounter (HOSPITAL_COMMUNITY): Payer: Self-pay | Admitting: Internal Medicine

## 2021-03-29 ENCOUNTER — Encounter (HOSPITAL_COMMUNITY)
Admission: RE | Admit: 2021-03-29 | Discharge: 2021-03-29 | Disposition: A | Discharge status: Home / Self Care | Payer: Medicare Other | Source: Ambulatory Visit | Attending: Nephrology | Admitting: Nephrology

## 2021-03-29 ENCOUNTER — Other Ambulatory Visit: Payer: Self-pay

## 2021-03-29 DIAGNOSIS — N1832 Chronic kidney disease, stage 3b: Secondary | ICD-10-CM | POA: Diagnosis not present

## 2021-03-29 DIAGNOSIS — D631 Anemia in chronic kidney disease: Secondary | ICD-10-CM | POA: Diagnosis not present

## 2021-03-29 LAB — POCT HEMOGLOBIN-HEMACUE: Hemoglobin: 9.1 g/dL — ABNORMAL LOW (ref 12.0–15.0)

## 2021-03-29 MED ORDER — EPOETIN ALFA-EPBX 3000 UNIT/ML IJ SOLN
3000.0000 [IU] | Freq: Once | INTRAMUSCULAR | Status: AC
  Administered 2021-03-29: 3000 [IU] via SUBCUTANEOUS
  Filled 2021-03-29: qty 1

## 2021-03-31 ENCOUNTER — Telehealth: Payer: Self-pay | Admitting: *Deleted

## 2021-03-31 NOTE — Chronic Care Management (AMB) (Signed)
  Chronic Care Management   Note  03/31/2021 Name: [name] [name] MRN: [id number] DOB: 1941-03-20  [name] [name] is a 80 y.o. year old female who is a primary care patient of Fayrene Helper, MD. I reached out to [employer] by phone today in response to a referral sent by Ms. Mohall PCP.  [name] [name] was given information about Chronic Care Management services today including:  CCM service includes personalized support from designated clinical staff supervised by her physician, including individualized plan of care and coordination with other care providers 24/7 contact phone numbers for assistance for urgent and routine care needs. Service will only be billed when office clinical staff spend 20 minutes or more in a month to coordinate care. Only one practitioner may furnish and bill the service in a calendar month. The patient may stop CCM services at any time (effective at the end of the month) by phone call to the office staff. The patient is responsible for co-pay (up to 20% after annual deductible is met) if co-pay is required by the individual health plan.   Patient agreed to services and verbal consent obtained.   Follow up plan: Telephone appointment with care management team member scheduled for:04/25/21  [place] [facility]  Direct Dial: [phone]

## 2021-04-04 ENCOUNTER — Encounter (INDEPENDENT_AMBULATORY_CARE_PROVIDER_SITE_OTHER): Payer: Self-pay

## 2021-04-12 ENCOUNTER — Encounter (HOSPITAL_COMMUNITY)
Admission: RE | Admit: 2021-04-12 | Discharge: 2021-04-12 | Disposition: A | Discharge status: Home / Self Care | Payer: Medicare Other | Source: Ambulatory Visit | Attending: Nephrology | Admitting: Nephrology

## 2021-04-12 ENCOUNTER — Encounter (HOSPITAL_COMMUNITY): Payer: Self-pay

## 2021-04-12 DIAGNOSIS — N1832 Chronic kidney disease, stage 3b: Secondary | ICD-10-CM | POA: Insufficient documentation

## 2021-04-12 DIAGNOSIS — D631 Anemia in chronic kidney disease: Secondary | ICD-10-CM | POA: Diagnosis not present

## 2021-04-12 LAB — POCT HEMOGLOBIN-HEMACUE: Hemoglobin: 8.5 g/dL — ABNORMAL LOW (ref 12.0–15.0)

## 2021-04-12 MED ORDER — EPOETIN ALFA-EPBX 3000 UNIT/ML IJ SOLN
3000.0000 [IU] | Freq: Once | INTRAMUSCULAR | Status: AC
  Administered 2021-04-12: 3000 [IU] via SUBCUTANEOUS

## 2021-04-12 MED ORDER — EPOETIN ALFA-EPBX 3000 UNIT/ML IJ SOLN
INTRAMUSCULAR | Status: AC
  Filled 2021-04-12: qty 1

## 2021-04-14 DIAGNOSIS — Z1211 Encounter for screening for malignant neoplasm of colon: Secondary | ICD-10-CM | POA: Diagnosis not present

## 2021-04-14 DIAGNOSIS — E039 Hypothyroidism, unspecified: Secondary | ICD-10-CM | POA: Diagnosis not present

## 2021-04-14 DIAGNOSIS — K635 Polyp of colon: Secondary | ICD-10-CM | POA: Diagnosis not present

## 2021-04-18 ENCOUNTER — Encounter (HOSPITAL_COMMUNITY): Payer: Self-pay | Admitting: Gastroenterology

## 2021-04-19 ENCOUNTER — Telehealth: Payer: Self-pay

## 2021-04-19 ENCOUNTER — Other Ambulatory Visit: Payer: Self-pay

## 2021-04-19 DIAGNOSIS — E785 Hyperlipidemia, unspecified: Secondary | ICD-10-CM

## 2021-04-19 DIAGNOSIS — I10 Essential (primary) hypertension: Secondary | ICD-10-CM

## 2021-04-19 DIAGNOSIS — E038 Other specified hypothyroidism: Secondary | ICD-10-CM

## 2021-04-19 DIAGNOSIS — E559 Vitamin D deficiency, unspecified: Secondary | ICD-10-CM

## 2021-04-19 NOTE — Telephone Encounter (Signed)
Patient called asked if her lab work can be transfer over to [facility] short stay clinic 10.26.2022.

## 2021-04-19 NOTE — Telephone Encounter (Signed)
I ordered it under [place]

## 2021-04-25 ENCOUNTER — Ambulatory Visit (INDEPENDENT_AMBULATORY_CARE_PROVIDER_SITE_OTHER): Payer: Medicare Other | Admitting: *Deleted

## 2021-04-25 DIAGNOSIS — I2510 Atherosclerotic heart disease of native coronary artery without angina pectoris: Secondary | ICD-10-CM

## 2021-04-25 DIAGNOSIS — I10 Essential (primary) hypertension: Secondary | ICD-10-CM

## 2021-04-25 NOTE — Patient Instructions (Signed)
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 expiration date on nitroglycerin and obtain refill if needed Advanced directives packet and education mailed Education mailed- heart healthy diet Encouraged pt to call and follow up on smoke detectors Self Care Activities:  Attends church or other social activities Calls provider office for new concerns or questions Patient Goals: - know and watch for signs of a heart attack - if I have chest pain, call for help - make sure nitroglycerin is not expired, ask for refill if needed - learn my personal risk  factors  - look over and complete advanced directives packet mailed to you, call RN care manager with any questions at [phone] - look over education mailed- heart healthy diet Follow Up Plan: Telephone follow up appointment with care management team member scheduled for:   06/22/2021    Patient Care Plan: Hypertension (Adult)     Problem Identified: Hypertension (Hypertension)   Priority: Medium      Long-Range Goal: Hypertension Monitored   Start Date: 04/25/2021  Expected End Date: 10/24/2021  This Visit's Progress: On track  Priority: Medium  Note:   Objective:  Last practice recorded BP readings:  BP Readings from Last 3 Encounters:  04/12/21 (!) 157/57  03/29/21 (!) 173/67  03/22/21 (!) 128/41   Most recent eGFR/CrCl:  Lab Results  Component Value Date   EGFR 33 (L) 10/19/2020    No components found for: CRCL Current Barriers:  Knowledge Deficits related to basic understanding of hypertension pathophysiology and self care management- pt reports she has blood pressure cuff and checks blood pressure weekly at least, reports she did receive flu vaccine.  Reports receives retacrit injection for iron deficiency anemia related to chronic blood loss, had colonoscopy 9/28 and to have endoscopy 10/27 for further evaluation Does not adhere to provider recommendations re: does not always eat low sodium diet Case Manager Clinical Goal(s):  patient will verbalize understanding of plan for hypertension management patient will attend all scheduled medical appointments patient will demonstrate improved adherence to prescribed treatment plan for hypertension as evidenced by taking all medications as prescribed, monitoring and recording blood pressure as directed, adhering to low sodium/DASH diet Interventions:  Collaboration with Fayrene Helper, MD regarding development and update of comprehensive plan of care as evidenced by provider attestation and co-signature Inter-disciplinary care team collaboration (see longitudinal plan of care) Evaluation of current treatment plan related to hypertension self management and patient's adherence to plan as established by provider. Provided education to patient re: stroke prevention, s/s of heart attack and stroke, DASH diet, complications of uncontrolled blood pressure Reviewed medications with patient and discussed importance of  compliance Discussed plans with patient for ongoing care management follow up and provided patient with direct contact information for care management team Advised patient, providing education and rationale, to monitor blood pressure daily and record, calling PCP for findings outside established parameters.  Reviewed scheduled/upcoming provider appointments including: 10/26 retacrit injection,  10/27 endoscopy 11/9 primary care provider, 07/04/21 Dr. Domenic Polite Education mailed - low sodium diet Self-Care Activities: Patient Goals: - check blood pressure 3 times per week - choose a place to take my blood pressure (home, clinic or office, retail store) - write blood pressure results in a log or diary  - follow low sodium diet - look over education mailed- low sodium diet Follow Up Plan: Telephone follow up appointment with care management team member scheduled for:   06/22/2021
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
## 2021-04-26 ENCOUNTER — Encounter (HOSPITAL_COMMUNITY)
Admission: RE | Admit: 2021-04-26 | Discharge: 2021-04-26 | Disposition: A | Discharge status: Home / Self Care | Payer: Medicare Other | Source: Ambulatory Visit | Attending: Nephrology | Admitting: Nephrology

## 2021-04-26 ENCOUNTER — Ambulatory Visit: Payer: Medicare Other | Admitting: Cardiology

## 2021-04-26 ENCOUNTER — Encounter (HOSPITAL_COMMUNITY): Payer: Self-pay

## 2021-04-26 VITALS — BP 182/52 | HR 50 | Temp 97.8°F | Resp 18 | Ht 61.0 in | Wt 164.0 lb

## 2021-04-26 DIAGNOSIS — N1831 Chronic kidney disease, stage 3a: Secondary | ICD-10-CM

## 2021-04-26 DIAGNOSIS — D631 Anemia in chronic kidney disease: Secondary | ICD-10-CM | POA: Diagnosis not present

## 2021-04-26 DIAGNOSIS — N1832 Chronic kidney disease, stage 3b: Secondary | ICD-10-CM | POA: Diagnosis not present

## 2021-04-26 LAB — POCT HEMOGLOBIN-HEMACUE: Hemoglobin: 7.7 g/dL — ABNORMAL LOW (ref 12.0–15.0)

## 2021-04-26 LAB — BASIC METABOLIC PANEL
Anion gap: 11 (ref 5–15)
BUN: 30 mg/dL — ABNORMAL HIGH (ref 8–23)
CO2: 15 mmol/L — ABNORMAL LOW (ref 22–32)
Calcium: 9.8 mg/dL (ref 8.9–10.3)
Chloride: 109 mmol/L (ref 98–111)
Creatinine, Ser: 1.47 mg/dL — ABNORMAL HIGH (ref 0.44–1.00)
GFR, Estimated: 36 mL/min — ABNORMAL LOW (ref >60)
Glucose, Bld: 85 mg/dL (ref 70–99)
Potassium: 4.2 mmol/L (ref 3.5–5.1)
Sodium: 135 mmol/L (ref 135–145)

## 2021-04-26 MED ORDER — EPOETIN ALFA-EPBX 3000 UNIT/ML IJ SOLN
3000.0000 [IU] | Freq: Once | INTRAMUSCULAR | Status: AC
  Administered 2021-04-26: 3000 [IU] via SUBCUTANEOUS
  Filled 2021-04-26: qty 1

## 2021-04-27 ENCOUNTER — Ambulatory Visit (HOSPITAL_COMMUNITY): Payer: Medicare Other | Admitting: Certified Registered Nurse Anesthetist

## 2021-04-27 ENCOUNTER — Ambulatory Visit (HOSPITAL_COMMUNITY)
Admission: RE | Admit: 2021-04-27 | Discharge: 2021-04-27 | Disposition: A | Discharge status: Home / Self Care | Payer: Medicare Other | Source: Ambulatory Visit | Attending: Gastroenterology | Admitting: Gastroenterology

## 2021-04-27 ENCOUNTER — Encounter (HOSPITAL_COMMUNITY): Payer: Self-pay | Admitting: Gastroenterology

## 2021-04-27 ENCOUNTER — Encounter (HOSPITAL_COMMUNITY)
Admission: RE | Disposition: A | Discharge status: Home / Self Care | Payer: Self-pay | Source: Ambulatory Visit | Attending: Gastroenterology

## 2021-04-27 ENCOUNTER — Other Ambulatory Visit: Payer: Self-pay

## 2021-04-27 DIAGNOSIS — I2510 Atherosclerotic heart disease of native coronary artery without angina pectoris: Secondary | ICD-10-CM | POA: Insufficient documentation

## 2021-04-27 DIAGNOSIS — E785 Hyperlipidemia, unspecified: Secondary | ICD-10-CM | POA: Insufficient documentation

## 2021-04-27 DIAGNOSIS — Z951 Presence of aortocoronary bypass graft: Secondary | ICD-10-CM | POA: Diagnosis not present

## 2021-04-27 DIAGNOSIS — N183 Chronic kidney disease, stage 3 unspecified: Secondary | ICD-10-CM | POA: Insufficient documentation

## 2021-04-27 DIAGNOSIS — Z87891 Personal history of nicotine dependence: Secondary | ICD-10-CM | POA: Insufficient documentation

## 2021-04-27 DIAGNOSIS — E669 Obesity, unspecified: Secondary | ICD-10-CM | POA: Diagnosis not present

## 2021-04-27 DIAGNOSIS — D509 Iron deficiency anemia, unspecified: Secondary | ICD-10-CM | POA: Insufficient documentation

## 2021-04-27 DIAGNOSIS — C7A8 Other malignant neuroendocrine tumors: Secondary | ICD-10-CM | POA: Insufficient documentation

## 2021-04-27 DIAGNOSIS — K219 Gastro-esophageal reflux disease without esophagitis: Secondary | ICD-10-CM | POA: Diagnosis not present

## 2021-04-27 DIAGNOSIS — Z888 Allergy status to other drugs, medicaments and biological substances status: Secondary | ICD-10-CM | POA: Insufficient documentation

## 2021-04-27 DIAGNOSIS — I129 Hypertensive chronic kidney disease with stage 1 through stage 4 chronic kidney disease, or unspecified chronic kidney disease: Secondary | ICD-10-CM | POA: Insufficient documentation

## 2021-04-27 DIAGNOSIS — E039 Hypothyroidism, unspecified: Secondary | ICD-10-CM | POA: Insufficient documentation

## 2021-04-27 DIAGNOSIS — K3189 Other diseases of stomach and duodenum: Secondary | ICD-10-CM | POA: Diagnosis not present

## 2021-04-27 DIAGNOSIS — C7A010 Malignant carcinoid tumor of the duodenum: Secondary | ICD-10-CM | POA: Diagnosis not present

## 2021-04-27 HISTORY — PX: EUS: SHX5427

## 2021-04-27 HISTORY — PX: ESOPHAGOGASTRODUODENOSCOPY (EGD) WITH PROPOFOL: SHX5813

## 2021-04-27 HISTORY — PX: BIOPSY: SHX5522

## 2021-04-27 SURGERY — UPPER ENDOSCOPIC ULTRASOUND (EUS) RADIAL
Anesthesia: Monitor Anesthesia Care

## 2021-04-27 MED ORDER — PROPOFOL 10 MG/ML IV BOLUS
INTRAVENOUS | Status: AC
  Filled 2021-04-27: qty 20

## 2021-04-27 MED ORDER — SODIUM CHLORIDE 0.9 % IV SOLN: INTRAVENOUS | Status: DC

## 2021-04-27 MED ORDER — EPHEDRINE SULFATE-NACL 50-0.9 MG/10ML-% IV SOSY
PREFILLED_SYRINGE | INTRAVENOUS | Status: DC | PRN
  Administered 2021-04-27 (×2): 10 mg via INTRAVENOUS
  Administered 2021-04-27: 15 mg via INTRAVENOUS
  Administered 2021-04-27 (×2): 10 mg via INTRAVENOUS

## 2021-04-27 MED ORDER — PROPOFOL 10 MG/ML IV BOLUS
INTRAVENOUS | Status: DC | PRN
  Administered 2021-04-27: 10 mg via INTRAVENOUS

## 2021-04-27 MED ORDER — LACTATED RINGERS IV SOLN: INTRAVENOUS | Status: DC

## 2021-04-27 MED ORDER — ONDANSETRON HCL 4 MG/2ML IJ SOLN
INTRAMUSCULAR | Status: DC | PRN
  Administered 2021-04-27: 4 mg via INTRAVENOUS

## 2021-04-27 MED ORDER — LIDOCAINE 2% (20 MG/ML) 5 ML SYRINGE
INTRAMUSCULAR | Status: DC | PRN
  Administered 2021-04-27: 40 mg via INTRAVENOUS

## 2021-04-27 MED ORDER — PROPOFOL 500 MG/50ML IV EMUL
INTRAVENOUS | Status: DC | PRN
  Administered 2021-04-27: 125 ug/kg/min via INTRAVENOUS

## 2021-04-27 NOTE — Anesthesia Preprocedure Evaluation (Signed)
Anesthesia Evaluation  Patient identified by MRN, date of birth, ID band Patient awake    Reviewed: Allergy & Precautions, NPO status , Patient's Chart, lab work & pertinent test results  Airway Mallampati: III  TM Distance: >3 FB Neck ROM: Full    Dental   Pulmonary former smoker,    breath sounds clear to auscultation       Cardiovascular hypertension, Pt. on medications and Pt. on home beta blockers + CAD, + CABG and + Peripheral Vascular Disease   Rhythm:Regular Rate:Normal     Neuro/Psych  Neuromuscular disease    GI/Hepatic Neg liver ROS, GERD  ,  Endo/Other  Hypothyroidism   Renal/GU Renal disease     Musculoskeletal  (+) Arthritis ,   Abdominal   Peds  Hematology  (+) anemia ,   Anesthesia Other Findings   Reproductive/Obstetrics                             Anesthesia Physical Anesthesia Plan  ASA: 3  Anesthesia Plan: MAC   Post-op Pain Management:    Induction:   PONV Risk Score and Plan: 2 and Propofol infusion, Ondansetron and Treatment may vary due to age or medical condition  Airway Management Planned: Natural Airway and Nasal Cannula  Additional Equipment:   Intra-op Plan:   Post-operative Plan:   Informed Consent: I have reviewed the patients History and Physical, chart, labs and discussed the procedure including the risks, benefits and alternatives for the proposed anesthesia with the patient or authorized representative who has indicated his/her understanding and acceptance.       Plan Discussed with: CRNA  Anesthesia Plan Comments:         Anesthesia Quick Evaluation

## 2021-04-27 NOTE — Op Note (Signed)
[facility] Patient Name: [name] [name] Procedure Date: 04/27/2021 MRN: [id number] Attending MD: Milus Banister , MD Date of Birth: [birth year] CSN: [id number] Age: 80 Admit Type: Outpatient Procedure:                Upper EUS Indications:              EGD recently showed small subepithelial lesion of                            the duodenum Providers:                Milus Banister, MD, Brooke Person, Hinton Dyer Referring MD:             Hildred Laser, MD Medicines:                Monitored Anesthesia Care Complications:            No immediate complications. Estimated blood loss:                            None. Estimated Blood Loss:     Estimated blood loss: none. Procedure:                Pre-Anesthesia Assessment:                           - Prior to the procedure, a History and Physical                            was performed, and patient medications and                            allergies were reviewed. The patient's tolerance of                            previous anesthesia was also reviewed. The risks                            and benefits of the procedure and the sedation                            options and risks were discussed with the patient.                            All questions were answered, and informed consent                            was obtained. Prior Anticoagulants: The patient has                            taken no previous anticoagulant or antiplatelet                            agents. ASA Grade Assessment: III - A patient with  severe systemic disease. After reviewing the risks                            and benefits, the patient was deemed in                            satisfactory condition to undergo the procedure.                           After obtaining informed consent, the endoscope was                            passed under direct vision. Throughout the                            procedure,  the patient's blood pressure, pulse, and                            oxygen saturations were monitored continuously. The                            [id number]-[id number] ([id number]) Olympus radial ultrasound                            scope was introduced through the mouth, and                            advanced to the second part of duodenum. The                            [id number] ([id number]) Olympus endoscope was introduced                            through the mouth, and advanced to the second part                            of duodenum. The [id number] ([id number]) Olympus linear                            ultrasound scope was introduced through the mouth,                            and advanced to the second part of duodenum. The                            upper EUS was accomplished without difficulty. The                            patient tolerated the procedure well. Scope In: Scope Out: Findings:      EGD findings:      1. Normal esophagus      2. Normal stomach      3. In the second part of the duodenum, just past the duodenal bulb there  was an approximately 1 cm rather firm subepithelial lesion. The mucosa       overlying was normal. Following EUS evaluation I performed mucosal       biopsies and tried to tunnel into the lesion.      ENDOSONOGRAPHIC FINDING: :      1. The duodenal subepithelial lesion described above was very difficult       to visualize by radial EUS scope due to its position in the duodenum,       just at the site of a turn into duodenum 2. I was however able to better       visualize it with linear EUS scope. The lesion was 8.4 mm maximum, it       was hypoechoic, confined to the deep mucosa and submucosal layers. I do       not see any involvement with the muscularis propria layer.      2. The pancreatic parenchyma and main pancreatic duct were normal      3. Common bile duct was normal, nondilated      4. Limited views of the liver, splenic and portal veins,  spleen were all       normal. Impression:               - 8.4 mm subepithelial lesion confined to the deep                            mucosa and submucosa layers without involvement of                            the muscularis propria layer. I sampled this with                            tunnel biopsies with forceps. It is not clear that                            this subepithelial lesion is of any clinical                            significance.                           -She has chronic iron deficiency anemia, hemoglobin                            yesterday was 7.7 (labs elsewhere). She has not                            been taking her oral iron in about 5 weeks since                            her endoscopy and colonoscopy. I reminded her today                            that she should restart it. Moderate Sedation:      Not Applicable - Patient had care per Anesthesia. Recommendation:           - Discharge  patient to home.                           - Await final pathology results. Procedure Code(s):        --- Professional ---                           [phone], Esophagogastroduodenoscopy, flexible,                            transoral; with endoscopic ultrasound examination                            limited to the esophagus, stomach or duodenum, and                            adjacent structures                           [id number], Esophagogastroduodenoscopy, flexible,                            transoral; with biopsy, single or multiple Diagnosis Code(s):        --- Professional ---                           K31.89, Other diseases of stomach and duodenum CPT copyright 2019 [employer]. All rights reserved. The codes documented in this report are preliminary and upon coder review may  be revised to meet current compliance requirements. Milus Banister, MD 04/27/2021 12:15:47 PM This report has been signed electronically. Number of Addenda: 0

## 2021-04-27 NOTE — H&P (Addendum)
HPI: This is a woman with duodenal nodule seen on recent Dr. Laural Golden EGD done for chronic IDA  Has not been taking her oral iron since her EGD/Colonoscopy 5 weeks ago.  Hb yesterday 7.7  ROS: complete GI ROS as described in HPI, all other review negative.  Constitutional:  No unintentional weight loss   Past Medical History:  Diagnosis Date   CAD (coronary artery disease)    Multivessel status post CABG October 2005   Carotid artery disease ([place])    CKD (chronic kidney disease), stage III ([place])    Essential hypertension    Glaucoma    Laser surgery 2010   Hyperlipidemia    Hypothyroidism    Iron deficiency anemia    Obesity     Past Surgical History:  Procedure Laterality Date   COLONOSCOPY N/A 02/04/2013   Procedure: COLONOSCOPY;  Surgeon: Rogene Houston, MD;  [facility];  Service: Endoscopy;  Laterality: N/A;  1015   COLONOSCOPY N/A 12/21/2016   Procedure: COLONOSCOPY;  Surgeon: Rogene Houston, MD;  [facility];  Service: Endoscopy;  Laterality: N/A;   COLONOSCOPY WITH PROPOFOL N/A 03/22/2021   Procedure: COLONOSCOPY WITH PROPOFOL;  Surgeon: Rogene Houston, MD;  [facility];  Service: Endoscopy;  Laterality: N/A;  8:15   CORONARY ARTERY BYPASS GRAFT  October 2005   Dr. Roxy Manns - LIMA to LAD, SVG to OM 2 and OM 3, SVG to PDA   ESOPHAGOGASTRODUODENOSCOPY N/A 12/21/2016   Procedure: ESOPHAGOGASTRODUODENOSCOPY (EGD);  Surgeon: Rogene Houston, MD;  [facility];  Service: Endoscopy;  Laterality: N/A;  11:10   ESOPHAGOGASTRODUODENOSCOPY N/A 02/27/2018   Procedure: ESOPHAGOGASTRODUODENOSCOPY (EGD);  Surgeon: Rogene Houston, MD;  [facility];  Service: Endoscopy;  Laterality: N/A;   EYE SURGERY  2010   laser treatment to both eyes for glaucoma   GIVENS CAPSULE STUDY N/A 03/07/2018   Procedure: GIVENS CAPSULE STUDY;  Surgeon: Rogene Houston, MD;  [facility];  Service: Endoscopy;  Laterality: N/A;   GIVENS  CAPSULE STUDY N/A 02/05/2020   Procedure: GIVENS CAPSULE STUDY;  Surgeon: Rogene Houston, MD;  [facility];  Service: Endoscopy;  Laterality: N/A;  730   POLYPECTOMY  03/22/2021   Procedure: POLYPECTOMY;  Surgeon: Rogene Houston, MD;  [facility];  Service: Endoscopy;;    Current Facility-Administered Medications  Medication Dose Route Frequency Provider Last Rate Last Admin   0.9 %  sodium chloride infusion   Intravenous Continuous Milus Banister, MD        Allergies as of 03/24/2021 - Review Complete 03/22/2021  Allergen Reaction Noted   Tramadol Itching 06/12/2018   Lisinopril Cough 02/24/2020   Clonidine derivatives Other (See Comments) 12/27/2010    Family History  Problem Relation Age of Onset   Aneurysm Mother    Hypertension Mother    Coronary artery disease Mother    Coronary artery disease Father    Diabetes Son    Pancreatic cancer Son    Colon cancer Brother 31       lived 45 months afterwards   Liver cancer Brother 49   Diabetes Sister    Coronary artery disease Sister    Diverticulosis Sister    COPD Sister    Kidney disease Sister    Diabetes Sister    Hypertension Sister    Breast cancer Sister 62   Hearing loss Sister    Heart Problems Sister  pacemaker    Social History   Socioeconomic History   Marital status: Married    Spouse name: Not on file   Number of children: 1   Years of education: Not on file   Highest education level: Not on file  Occupational History   Occupation: retired   Tobacco Use   Smoking status: Former    Packs/day: 0.25    Years: 40.00    Pack years: 10.00    Types: Cigarettes    Quit date: 07/03/2003    Years since quitting: 17.8   Smokeless tobacco: Never  Vaping Use   Vaping Use: Never used  Substance and Sexual Activity   Alcohol use: No    Alcohol/week: 0.0 standard drinks   Drug use: No   Sexual activity: Never  Other Topics Concern   Not on file  Social History Narrative    Not on file   Social Determinants of Health   Financial Resource Strain: Low Risk    Difficulty of Paying Living Expenses: Not hard at all  Food Insecurity: No Food Insecurity   Worried About Charity fundraiser in the Last Year: Never true   [place] in the Last Year: Never true  Transportation Needs: No Transportation Needs   Lack of Transportation (Medical): No   Lack of Transportation (Non-Medical): No  Physical Activity: Insufficiently Active   Days of Exercise per Week: 7 days   Minutes of Exercise per Session: 10 min  Stress: No Stress Concern Present   Feeling of Stress : Not at all  Social Connections: Moderately Integrated   Frequency of Communication with Friends and Family: More than three times a week   Frequency of Social Gatherings with Friends and Family: More than three times a week   Attends Religious Services: Never   Marine scientist or Organizations: Yes   Attends Archivist Meetings: Never   Marital Status: Married  Human resources officer Violence: Not At Risk   Fear of Current or Ex-Partner: No   Emotionally Abused: No   Physically Abused: No   Sexually Abused: No     Physical Exam: There were no vitals taken for this visit. Constitutional: generally well-appearing Psychiatric: alert and oriented x3 Abdomen: soft, nontender, nondistended, no obvious ascites, no peritoneal signs, normal bowel sounds No peripheral edema noted in lower extremities  Assessment and plan: 80 y.o. female with duodenal nodule  EUS evaluation today  Please see the "Patient Instructions" section for addition details about the plan.  Owens Loffler, MD [facility] 04/27/2021, 10:40 AM

## 2021-04-27 NOTE — Transfer of Care (Signed)
Immediate Anesthesia Transfer of Care Note  Patient: Chantrice P Mckinney  Procedure(s) Performed: UPPER ENDOSCOPIC ULTRASOUND (EUS) RADIAL BIOPSY  Patient Location: PACU and Endoscopy Unit  Anesthesia Type:MAC  Level of Consciousness: awake, alert  and oriented  Airway & Oxygen Therapy: Patient Spontanous Breathing and Patient connected to face mask oxygen  Post-op Assessment: Report given to RN and Post -op Vital signs reviewed and stable  Post vital signs: Reviewed and stable  Last Vitals:  Vitals Value Taken Time  BP    Temp    Pulse 59 04/27/21 1202  Resp 20 04/27/21 1202  SpO2 100 % 04/27/21 1202  Vitals shown include unvalidated device data.  Last Pain: There were no vitals filed for this visit.       Complications: No notable events documented.

## 2021-04-27 NOTE — Anesthesia Postprocedure Evaluation (Signed)
Anesthesia Post Note  Patient: [name] [name]  Procedure(s) Performed: UPPER ENDOSCOPIC ULTRASOUND (EUS) RADIAL BIOPSY     Patient location during evaluation: PACU Anesthesia Type: MAC Level of consciousness: awake and alert Pain management: pain level controlled Vital Signs Assessment: post-procedure vital signs reviewed and stable Respiratory status: spontaneous breathing, nonlabored ventilation and respiratory function stable Cardiovascular status: blood pressure returned to baseline and stable Postop Assessment: no apparent nausea or vomiting Anesthetic complications: no   No notable events documented.  Last Vitals:  Vitals:   04/27/21 1220 04/27/21 1230  BP: (!) 145/39 (!) 159/41  Pulse: (!) 53 (!) 49  Resp: 17 19  SpO2: 100% 94%    Last Pain:  Vitals:   04/27/21 1230  PainSc: 0-No pain                 Jarome Matin Dave Mergen

## 2021-04-27 NOTE — Anesthesia Procedure Notes (Signed)
Procedure Name: MAC Date/Time: 04/27/2021 11:12 AM Performed by: Maxwell Caul, CRNA Pre-anesthesia Checklist: Patient identified, Emergency Drugs available, Suction available and Patient being monitored Oxygen Delivery Method: Simple face mask

## 2021-04-27 NOTE — Discharge Instructions (Signed)
YOU HAD AN ENDOSCOPIC PROCEDURE TODAY: Refer to the procedure report and other information in the discharge instructions given to you for any specific questions about what was found during the examination. If this information does not answer your questions, please call [place] office at [phone] to clarify.   YOU SHOULD EXPECT: Some feelings of bloating in the abdomen. Passage of more gas than usual. Walking can help get rid of the air that was put into your GI tract during the procedure and reduce the bloating. If you had a lower endoscopy (such as a colonoscopy or flexible sigmoidoscopy) you may notice spotting of blood in your stool or on the toilet paper. Some abdominal soreness may be present for a day or two, also.  DIET: Your first meal following the procedure should be a light meal and then it is ok to progress to your normal diet. A half-sandwich or bowl of soup is an example of a good first meal. Heavy or fried foods are harder to digest and may make you feel nauseous or bloated. Drink plenty of fluids but you should avoid alcoholic beverages for 24 hours. If you had a esophageal dilation, please see attached instructions for diet.    ACTIVITY: Your care partner should take you home directly after the procedure. You should plan to take it easy, moving slowly for the rest of the day. You can resume normal activity the day after the procedure however YOU SHOULD NOT DRIVE, use power tools, machinery or perform tasks that involve climbing or major physical exertion for 24 hours (because of the sedation medicines used during the test).   SYMPTOMS TO REPORT IMMEDIATELY: A gastroenterologist can be reached at any hour. Please call [phone]  for any of the following symptoms:   Following upper endoscopy (EGD, EUS, ERCP, esophageal dilation) Vomiting of blood or coffee ground material  New, significant abdominal pain  New, significant chest pain or pain under the shoulder blades  Painful or  persistently difficult swallowing  New shortness of breath  Black, tarry-looking or red, bloody stools  FOLLOW UP:  If any biopsies were taken you will be contacted by phone or by letter within the next 1-3 weeks. Call [phone]  if you have not heard about the biopsies in 3 weeks.  Please also call with any specific questions about appointments or follow up tests.  

## 2021-04-28 ENCOUNTER — Encounter (HOSPITAL_COMMUNITY): Payer: Self-pay | Admitting: Gastroenterology

## 2021-04-28 ENCOUNTER — Other Ambulatory Visit (INDEPENDENT_AMBULATORY_CARE_PROVIDER_SITE_OTHER): Payer: Self-pay | Admitting: *Deleted

## 2021-04-28 DIAGNOSIS — D500 Iron deficiency anemia secondary to blood loss (chronic): Secondary | ICD-10-CM

## 2021-05-01 DIAGNOSIS — I10 Essential (primary) hypertension: Secondary | ICD-10-CM

## 2021-05-01 DIAGNOSIS — I2510 Atherosclerotic heart disease of native coronary artery without angina pectoris: Secondary | ICD-10-CM

## 2021-05-02 LAB — SURGICAL PATHOLOGY

## 2021-05-04 ENCOUNTER — Other Ambulatory Visit (INDEPENDENT_AMBULATORY_CARE_PROVIDER_SITE_OTHER): Payer: Self-pay | Admitting: Internal Medicine

## 2021-05-04 DIAGNOSIS — D500 Iron deficiency anemia secondary to blood loss (chronic): Secondary | ICD-10-CM

## 2021-05-04 LAB — HEMOGLOBIN AND HEMATOCRIT, BLOOD
Hematocrit: 24 % — ABNORMAL LOW (ref 34.0–46.6)
Hemoglobin: 7.5 g/dL — ABNORMAL LOW (ref 11.1–15.9)

## 2021-05-05 ENCOUNTER — Other Ambulatory Visit: Payer: Self-pay

## 2021-05-05 DIAGNOSIS — D3A8 Other benign neuroendocrine tumors: Secondary | ICD-10-CM

## 2021-05-07 ENCOUNTER — Other Ambulatory Visit (HOSPITAL_COMMUNITY): Payer: Self-pay | Admitting: *Deleted

## 2021-05-10 ENCOUNTER — Ambulatory Visit (INDEPENDENT_AMBULATORY_CARE_PROVIDER_SITE_OTHER): Payer: Medicare Other | Admitting: Family Medicine

## 2021-05-10 ENCOUNTER — Telehealth: Payer: Self-pay

## 2021-05-10 ENCOUNTER — Encounter: Payer: Self-pay | Admitting: Family Medicine

## 2021-05-10 ENCOUNTER — Other Ambulatory Visit: Payer: Self-pay

## 2021-05-10 ENCOUNTER — Other Ambulatory Visit: Payer: Self-pay | Admitting: Gastroenterology

## 2021-05-10 VITALS — BP 160/65 | HR 61 | Resp 18 | Ht 61.0 in | Wt 152.1 lb

## 2021-05-10 DIAGNOSIS — D3A8 Other benign neuroendocrine tumors: Secondary | ICD-10-CM | POA: Diagnosis not present

## 2021-05-10 DIAGNOSIS — I10 Essential (primary) hypertension: Secondary | ICD-10-CM | POA: Diagnosis not present

## 2021-05-10 DIAGNOSIS — Z5982 Transportation insecurity: Secondary | ICD-10-CM

## 2021-05-10 DIAGNOSIS — Z0000 Encounter for general adult medical examination without abnormal findings: Secondary | ICD-10-CM

## 2021-05-10 DIAGNOSIS — E559 Vitamin D deficiency, unspecified: Secondary | ICD-10-CM | POA: Diagnosis not present

## 2021-05-10 DIAGNOSIS — E038 Other specified hypothyroidism: Secondary | ICD-10-CM | POA: Diagnosis not present

## 2021-05-10 DIAGNOSIS — Z139 Encounter for screening, unspecified: Secondary | ICD-10-CM

## 2021-05-10 NOTE — Progress Notes (Signed)
    [name] [name]     MRN: [id number]      DOB: 1940/10/17  HPI: Patient is in for annual physical exam. Undergoing extensive GI testing recently dx with neuroendocrine tumor and having challenges with transportation to [facility] for treatment. Recent labs,  are reviewed. Immunization is reviewed , and  updated if needed.   PE: BP (!) 160/65   Pulse 61   Resp 18   Ht 5\' 1"  (1.549 m)   Wt 152 lb 1.9 oz (69 kg)   SpO2 90%   BMI 28.74 kg/m   Pleasant  female, alert and oriented x 3, in no cardio-pulmonary distress. Afebrile. HEENT No facial trauma or asymetry. Sinuses non tender.  Extra occullar muscles intact.. External ears normal, . Neck: supple, no adenopathy,JVD or thyromegaly.No bruits.  Chest: Clear to ascultation bilaterally.No crackles or wheezes. Non tender to palpation   Cardiovascular system; Heart sounds normal,  S1 and  S2 ,no S3.  Apical beat not displaced Peripheral pulses normal.  Abdomen: Soft, non tender,.     Musculoskeletal exam: Decreased  ROM of spine, hips , shoulders and knees.  No muscle wasting or atrophy.   Neurologic: Cranial nerves 2 to 12 intact. Power, tone ,sensation and reflexes normal throughout. No disturbance in gait. No tremor.  Skin: Intact, no ulceration, erythema , scaling or rash noted. Pigmentation normal throughout  Psych; Normal mood and affect. Judgement and concentration normal   Assessment & Plan:  Annual physical exam Annual exam as documented. Counseling done  re healthy lifestyle involving commitment to 150 minutes exercise per week, heart healthy diet, and attaining healthy weight.The importance of adequate sleep also discussed. Immunization and cancer screening needs are specifically addressed at this visit.   Transportation insecurity due to excessive travel time to destination New diagnosis of carcinoid tumor  needing to go to [facility] for management and unable to transport herself with no  reliable assistance , will reach out to social work

## 2021-05-10 NOTE — Telephone Encounter (Signed)
I spoke with the pt and she states she will have the labs and CT scan as long as she can get a ride.  She will call back and update if she can not make the appts.   Milus Banister, MD  05/05/2021 12:07 PM EDT     I spoke with her on the phone about the biopsy results.  I think she understands in general what is going on and that carcinoid lesions should be removed if possible.  I also explained that sometimes carcinoid lesions can spread and that she needs some testing to check for that.   Keshav Winegar, she needs a Netspot CT scan (specialized PET scan for neuroendocrine lesions like hers) as well as chromogranin A level.  Hopefully these can be done before her appointment with Gabe next month.  Thank you   Bennett Scrape

## 2021-05-10 NOTE — Telephone Encounter (Signed)
Prior auth sent to [employer] to set up the pt appt.

## 2021-05-10 NOTE — Patient Instructions (Addendum)
F/U in 3 months, call if you need me sooner  Please get lab ordered 11/4 by Dr Ardis Hughs today if possible  I have referred you to [facility] to see if you can get transport assistance to medical appointments which you need   Please reschedule eye exam when able  Lab today from this office to be ordered is chem 7 and EGFR and TSH and vit D today  Be careful not to fall  Thanks for choosing [facility], we consider it a privelige to serve you.

## 2021-05-10 NOTE — Telephone Encounter (Signed)
-----   Message from Valinda Hoar sent at 05/10/2021 10:57 AM EST ----- Regarding: RE: Ins. Auth info needed to Schedule I noticed that Ms. Narramore pre-authorization came through.  Ms. Frohlich is scheduled for 05/19/21 @ 9am. She did state that she has transportation issues and will cancel if she can't find a ride.   She also did not seem happy that this was ordered as she said Dr. Ardis Hughs said there wasn't anything left to do for her.   Leah ----- Message ----- From: Greer Pickerel Sent: 05/09/2021  11:57 AM EST To: April H Pait, Timothy Lasso, RN, # Subject: Ins. Auth info needed to Schedule              Hi Junette Bernat,  In order to schedule speciality exams, we will need insurance auth info.  Once this is provided, we can get her scheduled.  Does this patient receive sandostatin type injections?  If so, when is her next inj scheduled?  This will help [place] determine when she can be scheduled.  Thanks,  Museum/gallery conservator ----- Message ----- From: Sheldon Silvan, April H Sent: 05/05/2021  12:49 PM EST To: [place]   ----- Message ----- From: Timothy Lasso, RN Sent: 05/05/2021  12:25 PM EDT To: April H Pait, Roosvelt Maser  Pt needs  Netspot CT scan order has been entered thank you

## 2021-05-10 NOTE — Telephone Encounter (Signed)
-----   Message from Darden Dates sent at 05/10/2021  9:03 AM EST ----- Regarding: RE: Ins. Auth info needed to Schedule Auth# [id number] good 05/10/21-06/24/21  ----- Message ----- From: Timothy Lasso, RN Sent: 05/09/2021  12:00 PM EST To: Greer Pickerel, Amy L [place] Subject: RE: Ins. Auth info needed to Schedule          No injections.  Amy can you please update on prior auth? ----- Message ----- From: Greer Pickerel Sent: 05/09/2021  11:57 AM EST To: April H Pait, Timothy Lasso, RN, # Subject: Ins. Auth info needed to Schedule              Hi Davionne Mastrangelo,  In order to schedule speciality exams, we will need insurance auth info.  Once this is provided, we can get her scheduled.  Does this patient receive sandostatin type injections?  If so, when is her next inj scheduled?  This will help [place] determine when she can be scheduled.  Thanks,  Museum/gallery conservator ----- Message ----- From: Sheldon Silvan, April H Sent: 05/05/2021  12:49 PM EST To: [place]   ----- Message ----- From: Timothy Lasso, RN Sent: 05/05/2021  12:25 PM EDT To: April H Pait, Roosvelt Maser  Pt needs  Netspot CT scan order has been entered thank you

## 2021-05-11 ENCOUNTER — Other Ambulatory Visit: Payer: Self-pay

## 2021-05-11 ENCOUNTER — Encounter (HOSPITAL_COMMUNITY)
Admission: RE | Admit: 2021-05-11 | Discharge: 2021-05-11 | Disposition: A | Discharge status: Home / Self Care | Payer: Medicare Other | Source: Ambulatory Visit | Attending: Nephrology | Admitting: Nephrology

## 2021-05-11 ENCOUNTER — Telehealth: Payer: Self-pay | Admitting: *Deleted

## 2021-05-11 DIAGNOSIS — D631 Anemia in chronic kidney disease: Secondary | ICD-10-CM | POA: Insufficient documentation

## 2021-05-11 DIAGNOSIS — E038 Other specified hypothyroidism: Secondary | ICD-10-CM

## 2021-05-11 DIAGNOSIS — N1832 Chronic kidney disease, stage 3b: Secondary | ICD-10-CM | POA: Insufficient documentation

## 2021-05-11 LAB — BMP8+EGFR
BUN/Creatinine Ratio: 18 (ref 12–28)
BUN: 25 mg/dL (ref 8–27)
CO2: 22 mmol/L (ref 20–29)
Calcium: 10.3 mg/dL (ref 8.7–10.3)
Chloride: 106 mmol/L (ref 96–106)
Creatinine, Ser: 1.38 mg/dL — ABNORMAL HIGH (ref 0.57–1.00)
Glucose: 100 mg/dL — ABNORMAL HIGH (ref 70–99)
Potassium: 4.8 mmol/L (ref 3.5–5.2)
Sodium: 142 mmol/L (ref 134–144)
eGFR: 39 mL/min/{1.73_m2} — ABNORMAL LOW (ref >59)

## 2021-05-11 LAB — CHROMOGRANIN A: Chromogranin A (ng/mL): 2051 ng/mL — ABNORMAL HIGH (ref 0.0–101.8)

## 2021-05-11 LAB — POCT HEMOGLOBIN-HEMACUE: Hemoglobin: 7.9 g/dL — ABNORMAL LOW (ref 12.0–15.0)

## 2021-05-11 LAB — TSH: TSH: 0.193 u[IU]/mL — ABNORMAL LOW (ref 0.450–4.500)

## 2021-05-11 LAB — VITAMIN D 25 HYDROXY (VIT D DEFICIENCY, FRACTURES): Vit D, 25-Hydroxy: 43 ng/mL (ref 30.0–100.0)

## 2021-05-11 MED ORDER — LEVOTHYROXINE SODIUM 88 MCG PO TABS: 88.0000 ug | ORAL_TABLET | Freq: Every day | ORAL | 3 refills | Status: DC

## 2021-05-11 MED ORDER — EPOETIN ALFA-EPBX 3000 UNIT/ML IJ SOLN
3000.0000 [IU] | Freq: Once | INTRAMUSCULAR | Status: AC
  Administered 2021-05-11: 3000 [IU] via SUBCUTANEOUS
  Filled 2021-05-11: qty 1

## 2021-05-11 NOTE — Telephone Encounter (Signed)
   Telephone encounter was:  Successful.  05/11/2021 Name: NORELY SCHLICK MRN: [id number] DOB: 11-Nov-1940  Darrien P Tesoro is a 80 y.o. year old female who is a primary care patient of Fayrene Helper, MD . The community resource team was consulted for assistance with Transportation Needs Patient was informaed she has used all her rides at this time but her niece is willing to take her this appt in [place] she will contact us if she needs more assitance with her rides [facility] only gives her 28   Care guide performed the following interventions: Patient provided with information about care guide support team and interviewed to confirm resource needs Follow up call placed to community resources to determine status of patients referral.  Follow Up Plan:  No further follow up planned at this time. The patient has been provided with needed resources.  [place], Care Management  [phone] [address] , [place] [address] Email : [email or web address]

## 2021-05-13 ENCOUNTER — Encounter: Payer: Self-pay | Admitting: Family Medicine

## 2021-05-13 DIAGNOSIS — Z5982 Transportation insecurity: Secondary | ICD-10-CM | POA: Insufficient documentation

## 2021-05-13 NOTE — Assessment & Plan Note (Signed)
Annual exam as documented. Counseling done  re healthy lifestyle involving commitment to 150 minutes exercise per week, heart healthy diet, and attaining healthy weight.The importance of adequate sleep also discussed.  Immunization and cancer screening needs are specifically addressed at this visit.  

## 2021-05-13 NOTE — Assessment & Plan Note (Signed)
New diagnosis of carcinoid tumor  needing to go to [facility] for management and unable to transport herself with no reliable assistance , will reach out to social work

## 2021-05-19 ENCOUNTER — Ambulatory Visit (HOSPITAL_COMMUNITY)
Admission: RE | Admit: 2021-05-19 | Discharge: 2021-05-19 | Disposition: A | Discharge status: Home / Self Care | Payer: Medicare Other | Source: Ambulatory Visit | Attending: Gastroenterology | Admitting: Gastroenterology

## 2021-05-19 DIAGNOSIS — D3A8 Other benign neuroendocrine tumors: Secondary | ICD-10-CM | POA: Insufficient documentation

## 2021-05-19 DIAGNOSIS — R911 Solitary pulmonary nodule: Secondary | ICD-10-CM | POA: Diagnosis not present

## 2021-05-19 DIAGNOSIS — Z86012 Personal history of benign carcinoid tumor: Secondary | ICD-10-CM | POA: Diagnosis not present

## 2021-05-19 DIAGNOSIS — I700 Atherosclerosis of aorta: Secondary | ICD-10-CM | POA: Diagnosis not present

## 2021-05-19 DIAGNOSIS — Z951 Presence of aortocoronary bypass graft: Secondary | ICD-10-CM | POA: Diagnosis not present

## 2021-05-19 MED ORDER — GALLIUM GA 68 DOTATATE IV KIT
4.0000 | PACK | Freq: Once | INTRAVENOUS | Status: AC | PRN
  Administered 2021-05-19: 4 via INTRAVENOUS

## 2021-05-19 MED ORDER — GALLIUM GA 68 GOZETOTIDE (LOCAMETZ): 4.0000 | PACK | Freq: Once | INTRAVENOUS | Status: DC

## 2021-06-01 ENCOUNTER — Encounter (HOSPITAL_COMMUNITY)
Admission: RE | Admit: 2021-06-01 | Discharge: 2021-06-01 | Disposition: A | Discharge status: Home / Self Care | Payer: Medicare Other | Source: Ambulatory Visit | Attending: Nephrology | Admitting: Nephrology

## 2021-06-01 ENCOUNTER — Other Ambulatory Visit: Payer: Self-pay

## 2021-06-01 DIAGNOSIS — D631 Anemia in chronic kidney disease: Secondary | ICD-10-CM | POA: Insufficient documentation

## 2021-06-01 DIAGNOSIS — N1832 Chronic kidney disease, stage 3b: Secondary | ICD-10-CM | POA: Insufficient documentation

## 2021-06-01 DIAGNOSIS — D509 Iron deficiency anemia, unspecified: Secondary | ICD-10-CM | POA: Diagnosis not present

## 2021-06-01 LAB — POCT HEMOGLOBIN-HEMACUE: Hemoglobin: 9 g/dL — ABNORMAL LOW (ref 12.0–15.0)

## 2021-06-01 MED ORDER — EPOETIN ALFA-EPBX 3000 UNIT/ML IJ SOLN
3000.0000 [IU] | Freq: Once | INTRAMUSCULAR | Status: AC
  Administered 2021-06-01: 3000 [IU] via SUBCUTANEOUS

## 2021-06-01 MED ORDER — EPOETIN ALFA-EPBX 3000 UNIT/ML IJ SOLN
INTRAMUSCULAR | Status: AC
  Filled 2021-06-01: qty 1

## 2021-06-09 ENCOUNTER — Ambulatory Visit: Payer: Medicare Other | Admitting: Gastroenterology

## 2021-06-09 VITALS — BP 172/68 | HR 74 | Ht 61.0 in | Wt 153.0 lb

## 2021-06-09 DIAGNOSIS — D509 Iron deficiency anemia, unspecified: Secondary | ICD-10-CM

## 2021-06-09 DIAGNOSIS — R198 Other specified symptoms and signs involving the digestive system and abdomen: Secondary | ICD-10-CM | POA: Diagnosis not present

## 2021-06-09 DIAGNOSIS — D3A8 Other benign neuroendocrine tumors: Secondary | ICD-10-CM

## 2021-06-09 DIAGNOSIS — E340 Carcinoid syndrome: Secondary | ICD-10-CM

## 2021-06-09 NOTE — Patient Instructions (Signed)
Your provider has requested that you go to the basement level for lab work before leaving today. Press "B" on the elevator. The lab is located at the first door on the left as you exit the elevator.  You have been scheduled for an endoscopy. Please follow written instructions given to you at your visit today. If you use inhalers (even only as needed), please bring them with you on the day of your procedure.  If you are age 79 or older, your body mass index should be between 23-30. Your Body mass index is 28.91 kg/m. If this is out of the aforementioned range listed, please consider follow up with your Primary Care Provider.  If you are age 1 or younger, your body mass index should be between 19-25. Your Body mass index is 28.91 kg/m. If this is out of the aformentioned range listed, please consider follow up with your Primary Care Provider.   ________________________________________________________  The [facility] providers would like to encourage you to use [facility] to communicate with providers for non-urgent requests or questions.  Due to long hold times on the telephone, sending your provider a message by [facility] may be a faster and more efficient way to get a response.  Please allow 48 business hours for a response.  Please remember that this is for non-urgent requests.  _______________________________________________________  Thank you for choosing me and [facility].  Dr. Rush Landmark

## 2021-06-09 NOTE — Progress Notes (Signed)
[place] VISIT   Primary Care Provider Fayrene Helper, MD [address] [place] [address] [phone]  Referring Provider Dr. Laural Golden and Dr. Ardis Hughs  Patient Profile: [name] [name] is a 80 y.o. female with a pmh significant for CAD, carotid artery disease, CRI, hypertension, hyperlipidemia, hypothyroidism, glaucoma, obesity, IDA NOS, duodenal neuroendocrine tumor (in situ).  The patient presents to the [facility] for an evaluation and management of problem(s) noted below:  Problem List 1. Benign neuroendocrine tumor of duodenum   2. Neuroendocrine tumor   3. Abnormal findings on esophagogastroduodenoscopy (EGD)   4. Iron deficiency anemia, unspecified iron deficiency anemia type     History of Present Illness This is the patient's first visit to the outpatient [place] clinic.  She has been followed by Dr. Laural Golden over the course the last few years from a GI standpoint.  She has had iron deficiency anemia of unclear etiology.  Work-up has been outlined by Dr. Laural Golden is included 2 endoscopies, 2 colonoscopies, 2 incomplete video capsule endoscopies without any etiology having been found.  On most recent upper endoscopy, a submucosal lesion was noted in the duodenal bulb.  This was followed up by an endoscopic ultrasound that showed on pathology a duodenal neuroendocrine tumor that was only in the muscularis propria without extension into the submucosa.  It is for this reason that the patient is referred for consideration of advanced endoscopic resection.  Today, the patient is accompanied by her husband.  They do understand that an abnormality was found.  They are not is completely clear as to what a neuroendocrine tumor is but have undergone all the testing that has been recommended.  She denies any significant abdominal pain.  She denies any other significant GI issues at this time.  GI Review of Systems Positive as  above Negative for pyrosis, dysphagia, odynophagia, nausea, vomiting, alteration of bowel habits, melena, hematochezia  Review of Systems General: Denies fevers/chills/weight loss unintentionally Cardiovascular: Denies chest pain/palpitations Pulmonary: Denies shortness of breath Gastroenterological: See HPI Genitourinary: Denies darkened urine Hematological: Denies easy bruising/bleeding Dermatological: Denies jaundice Psychological: Mood is stable   Medications Current Outpatient Medications  Medication Sig Dispense Refill   acetaminophen (TYLENOL) 500 MG tablet Take 1,000 mg by mouth every 6 (six) hours as needed for mild pain or headache.      amLODipine (NORVASC) 5 MG tablet TAKE ONE TABLET BY MOUTH DAILY. 90 tablet 1   aspirin 81 MG tablet Take 1 tablet (81 mg total) by mouth daily. Restart on 03/02/18 30 tablet 0   cetirizine (ZYRTEC) 10 MG tablet TAKE ONE TABLET BY MOUTH DAILY 90 tablet 1   ferrous sulfate 325 (65 FE) MG tablet Take 325 mg by mouth daily with breakfast.     fish oil-omega-3 fatty acids 1000 MG capsule Take 1 g by mouth daily.     fluticasone (FLONASE) 50 MCG/ACT nasal spray USE 2 SPRAYS IN EACH NOSTRIL ONCE DAILY. SHAKE GENTLY BEFORE USING. (Patient taking differently: Place 1 spray into both nostrils daily as needed for allergies.) 48 g 1   hydrALAZINE (APRESOLINE) 100 MG tablet Take 100 mg by mouth 2 (two) times daily.     latanoprost (XALATAN) 0.005 % ophthalmic solution Place 1 drop into both eyes at bedtime.     levothyroxine (SYNTHROID) 88 MCG tablet Take 1 tablet (88 mcg total) by mouth daily. 30 tablet 3   metoprolol succinate (TOPROL-XL) 100 MG 24 hr tablet TAKE ONE TABLET  BY MOUTH DAILY. TAKE WITH OR IMMEDIATELY FOLLOWING A MEAL. 90 tablet 1   Misc. Devices (WALL GRAB BAR) MISC Grab bar for shower x 2 DX high fall risk 2 each 0   nitroGLYCERIN (NITROSTAT) 0.4 MG SL tablet DISSOLVE ONE TABLET UNDER TONGUE EVERY 5 MINUTES UP TO 3 DOSES AS NEEDED FOR CHEST  PAIN. 25 tablet 3   olmesartan (BENICAR) 20 MG tablet Take 20 mg by mouth daily.     omeprazole (PRILOSEC) 20 MG capsule TAKE ONE CAPSULE BY MOUTH ONCE DAILY 90 capsule 3   Polyvinyl Alcohol-Povidone (REFRESH OP) Place 1 drop into both eyes at bedtime.     rosuvastatin (CRESTOR) 10 MG tablet TAKE ONE TABLET BY MOUTH DAILY. 90 tablet 3   trolamine salicylate (ASPERCREME) 10 % cream Apply 1 application topically as needed for muscle pain.     UNABLE TO FIND Shower chair x 1  DX M54.36 1 each 0   No current facility-administered medications for this visit.    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   Carotid artery disease ([place])    CKD (chronic kidney disease), stage III ([place])    Essential hypertension    Glaucoma    Laser surgery 2010   Hyperlipidemia    Hypothyroidism    Iron deficiency anemia    Obesity    Past Surgical History:  Procedure Laterality Date   BIOPSY  04/27/2021   Procedure: BIOPSY;  Surgeon: Milus Banister, MD;  Location: [facility];  Service: Endoscopy;;   COLONOSCOPY N/A 02/04/2013   Procedure: COLONOSCOPY;  Surgeon: Rogene Houston, MD;  [facility];  Service: Endoscopy;  Laterality: N/A;  1015   COLONOSCOPY N/A 12/21/2016   Procedure: COLONOSCOPY;  Surgeon: Rogene Houston, MD;  [facility];  Service: Endoscopy;  Laterality: N/A;   COLONOSCOPY WITH PROPOFOL N/A 03/22/2021   Procedure: COLONOSCOPY WITH PROPOFOL;  Surgeon: Rogene Houston, MD;  [facility];  Service: Endoscopy;  Laterality: N/A;  8:15   CORONARY ARTERY BYPASS GRAFT  October 2005   Dr. Roxy Manns - LIMA to LAD, SVG to OM 2 and OM 3, SVG to PDA   ESOPHAGOGASTRODUODENOSCOPY N/A 12/21/2016   Procedure: ESOPHAGOGASTRODUODENOSCOPY  (EGD);  Surgeon: Rogene Houston, MD;  [facility];  Service: Endoscopy;  Laterality: N/A;  11:10   ESOPHAGOGASTRODUODENOSCOPY N/A 02/27/2018   Procedure: ESOPHAGOGASTRODUODENOSCOPY (EGD);  Surgeon: Rogene Houston, MD;  [facility];  Service: Endoscopy;  Laterality: N/A;   ESOPHAGOGASTRODUODENOSCOPY (EGD) WITH PROPOFOL N/A 04/27/2021   Procedure: ESOPHAGOGASTRODUODENOSCOPY (EGD) WITH PROPOFOL;  Surgeon: Milus Banister, MD;  Location: [facility];  Service: Endoscopy;  Laterality: N/A;   EUS N/A 04/27/2021   Procedure: UPPER ENDOSCOPIC ULTRASOUND (EUS) RADIAL;  Surgeon: Milus Banister, MD;  Location: [facility];  Service: Endoscopy;  Laterality: N/A;   EYE SURGERY  2010   laser treatment to both eyes for glaucoma   GIVENS CAPSULE STUDY N/A 03/07/2018   Procedure: GIVENS CAPSULE STUDY;  Surgeon: Rogene Houston, MD;  [facility];  Service: Endoscopy;  Laterality: N/A;   GIVENS CAPSULE STUDY N/A 02/05/2020   Procedure: GIVENS CAPSULE STUDY;  Surgeon: Rogene Houston, MD;  [facility];  Service: Endoscopy;  Laterality: N/A;  730   POLYPECTOMY  03/22/2021   Procedure: POLYPECTOMY;  Surgeon: Rogene Houston, MD;  [facility];  Service: Endoscopy;;   Social History   Socioeconomic History   Marital status: Married    Spouse name: Not on file   Number of children: 1   Years of education: Not on file   Highest education level: Not on file  Occupational History   Occupation: retired   Tobacco Use   Smoking status: Former    Packs/day: 0.25    Years: 40.00    Pack years: 10.00    Types: Cigarettes    Quit date: 07/03/2003    Years since quitting: 17.9   Smokeless tobacco: Never  Vaping Use   Vaping Use: Never used  Substance and Sexual Activity   Alcohol use: No    Alcohol/week: 0.0 standard drinks   Drug use: No   Sexual activity: Never  Other Topics Concern   Not on file  Social History Narrative   Not on file    Social Determinants of Health   Financial Resource Strain: Low Risk    Difficulty of Paying Living Expenses: Not hard at all  Food Insecurity: No Food Insecurity   Worried About Charity fundraiser in the Last Year: Never true   [place] in the Last Year: Never true  Transportation Needs: No Transportation Needs   Lack of Transportation (Medical): No   Lack of Transportation (Non-Medical): No  Physical Activity: Insufficiently Active   Days of Exercise per Week: 7 days   Minutes of Exercise per Session: 10 min  Stress: No Stress Concern Present   Feeling of Stress : Not at all  Social Connections: Moderately Integrated   Frequency of Communication with Friends and Family: More than three times a week   Frequency of Social Gatherings with Friends and Family: More than three times a week   Attends Religious Services: Never   Marine scientist or Organizations: Yes   Attends Archivist Meetings: Never   Marital Status: Married  Human resources officer Violence: Not At Risk   Fear of Current or Ex-Partner: No   Emotionally Abused: No   Physically Abused: No   Sexually Abused: No   Family History  Problem Relation Age of Onset   Aneurysm Mother    Hypertension Mother    Coronary artery disease Mother    Coronary artery disease Father    Diabetes Sister    Coronary artery disease Sister    Diverticulosis Sister    COPD Sister    Kidney disease Sister    Diabetes Sister    Hypertension Sister    Breast cancer Sister 24   Hearing loss Sister    Heart Problems Sister        pacemaker   Colon cancer Brother 51       lived 48 months afterwards   Liver cancer Brother 17   Diabetes Son    Pancreatic cancer Son    Esophageal cancer Neg Hx    Inflammatory bowel disease Neg Hx    Stomach cancer Neg Hx    I have reviewed her medical, social, and family history in detail and updated the electronic medical record as necessary.    PHYSICAL EXAMINATION  BP (!)  172/68   Pulse 74   Ht '5\' 1"'  (1.549 m)   Wt 153 lb (69.4 kg)   BMI  28.91 kg/m  Wt Readings from Last 3 Encounters:  06/09/21 153 lb (69.4 kg)  05/11/21 152 lb 1.9 oz (69 kg)  05/10/21 152 lb 1.9 oz (69 kg)  GEN: NAD, elderly but appears stated age, nontoxic in appearance, husband at her side PSYCH: Cooperative, without pressured speech EYE: Conjunctivae pink, sclerae anicteric ENT: MMM CV: Nontachycardic RESP: No audible wheezing GI: NABS, soft, NT/ND, without rebound or guarding MSK/EXT: No lower extremity edema SKIN: No jaundice NEURO:  Alert & Oriented x 3, no focal deficits   REVIEW OF DATA  I reviewed the following data at the time of this encounter:  GI Procedures and Studies  October 2022 EUS - 8.4 mm subepithelial lesion confined to the deep mucosa and submucosa layers without involvement of the muscularis propria layer. I sampled this with tunnel biopsies with forceps. It is not clear that this subepithelial lesion is of any clinical significance. -She has chronic iron deficiency anemia, hemoglobin yesterday was 7.7 (labs elsewhere).  She has not been taking her oral iron in about 5 weeks since her endoscopy and colonoscopy. I reminded her today that she should restart it.  Pathology FINAL MICROSCOPIC DIAGNOSIS:  A. DUODENUM, MASS, BIOPSY:  - Well-differentiated neuroendocrine (carcinoid) tumor, G1.  See comment  COMMENT:  Immunohistochemical stains show that the tumor cells are positive for synaptophysin and chromogranin, consistent with above diagnosis.  Ki-67 shows a proliferative index of about 1%, consistent with a low-grade (G1) tumor.   September 2022 EGD - Normal hypopharynx. - Normal esophagus. - Z-line irregular, 40 cm from the incisors. - A few gastric polyps. - Normal duodenal bulb. 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 Size about 12 mm. - duodenal hemosiderosis. - No specimens collected.  Laboratory Studies  Reviewed those in  epic  Imaging Studies  November 2022 dotatate scan IMPRESSION: 1. No focal activity within the duodenum to the localize well differentiated neuroendocrine tumor. 2. No evidence of metastatic neuroendocrine tumor. 3. Some motion degradation in the  upper abdomen as above.   ASSESSMENT  [name] [name] is a 80 y.o. female with a pmh significant for CAD, carotid artery disease, CRI, hypertension, hyperlipidemia, hypothyroidism, glaucoma, obesity, IDA NOS, duodenal neuroendocrine tumor (in situ).  The patient is seen today for evaluation and management of:  1. Benign neuroendocrine tumor of duodenum   2. Neuroendocrine tumor   3. Abnormal findings on esophagogastroduodenoscopy (EGD)   4. Iron deficiency anemia, unspecified iron deficiency anemia type    The patient is hemodynamically and clinically stable.  She has evidence of a duodenal bulb neuroendocrine tumor.  Based on EUS imaging by my partner, it seems that this is subcentimeter.  Based on NCCN criteria, and the data, the likelihood of duodenal neuroendocrine tumor metastasis in the future should be less than 10% when lesions are less than 1 cm.  As such an endoscopic attempt at resection is reasonable based on her age and medical comorbidities.  Based upon the description and endoscopic pictures I do feel that it is reasonable to pursue an Advanced Polypectomy attempt of the polyp/lesion.  We discussed some of the techniques of advanced polypectomy which include Endoscopic Mucosal Resection, OVESCO Full-Thickness Resection, Endorotor Morcellation, and Tissue Ablation via Fulguration.  We also reviewed images of typical techniques as noted above.  The risks and benefits of endoscopic evaluation were discussed with the patient; these include but are not limited to the risk of perforation, infection, bleeding, missed lesions, lack of diagnosis, severe illness requiring hospitalization, as well  as anesthesia and sedation related illnesses.  During  attempts at advanced resection, the risks of bleeding and perforation/leak are increased as opposed to diagnostic and screening procedures, and that was discussed with the patient as well.   In addition, I explained that with the possible need for piecemeal resection, subsequent short-interval endoscopic evaluation for follow up and potential retreatment of the lesion/area may be necessary.  I did offer, a referral to surgery in order for patient to have opportunity to discuss surgical management/intervention prior to finalizing decision for attempt at endoscopic removal, however, the patient deferred on this.  If, after attempt at removal of the polyp/lesion, it is found that the patient has a complication or that an invasive lesion or malignant lesion is found, or that the polyp/lesion continues to recur, the patient is aware and understands that surgery may still be indicated/required.  All patient questions were answered, to the best of my ability, and the patient agrees to the aforementioned plan of action with follow-up as indicated. Preprocedure labs to be ordered Will proceed with scheduling EGD with EMR (EUS to be available if any other concerns Further work-up of IDA as per Dr. Laural Golden  PLAN  Preprocedure labs to be ordered Will proceed with scheduling EGD with EMR (EUS to be available if any other concerns Further work-up of IDA as per Dr. Laural Golden   Orders Placed This Encounter  Procedures   Procedural/ Surgical Case Request: UPPER ESOPHAGEAL ENDOSCOPIC ULTRASOUND (EUS), ENDOSCOPIC MUCOSAL RESECTION   CBC   Basic Metabolic Panel (BMET)   INR/PT   Ambulatory referral to Gastroenterology    New Prescriptions   No medications on file   Modified Medications   No medications on file    Planned Follow Up No follow-ups on file.   Total Time in Face-to-Face and in Coordination of Care for patient including independent/personal interpretation/review of prior testing, medical history,  examination, medication adjustment, communicating results with the patient directly, and documentation within the EHR is 45 minutes.   Justice Britain, MD [facility] Advanced Endoscopy Office # [phone]

## 2021-06-10 ENCOUNTER — Encounter: Payer: Self-pay | Admitting: Gastroenterology

## 2021-06-10 DIAGNOSIS — R198 Other specified symptoms and signs involving the digestive system and abdomen: Secondary | ICD-10-CM | POA: Insufficient documentation

## 2021-06-10 DIAGNOSIS — D509 Iron deficiency anemia, unspecified: Secondary | ICD-10-CM | POA: Insufficient documentation

## 2021-06-10 DIAGNOSIS — D3A8 Other benign neuroendocrine tumors: Secondary | ICD-10-CM | POA: Insufficient documentation

## 2021-06-14 ENCOUNTER — Other Ambulatory Visit: Payer: Self-pay

## 2021-06-14 ENCOUNTER — Telehealth: Payer: Self-pay

## 2021-06-14 ENCOUNTER — Other Ambulatory Visit: Payer: Self-pay | Admitting: Family Medicine

## 2021-06-14 DIAGNOSIS — I10 Essential (primary) hypertension: Secondary | ICD-10-CM

## 2021-06-14 DIAGNOSIS — E785 Hyperlipidemia, unspecified: Secondary | ICD-10-CM

## 2021-06-14 NOTE — Telephone Encounter (Signed)
Is wanting to get her labs done at [facility] when she goes to short stay tomorrow. What labs does she need?

## 2021-06-14 NOTE — Telephone Encounter (Signed)
Pt aware and labs ordered 

## 2021-06-15 ENCOUNTER — Encounter (HOSPITAL_COMMUNITY)
Admission: RE | Admit: 2021-06-15 | Discharge: 2021-06-15 | Disposition: A | Discharge status: Home / Self Care | Payer: Medicare Other | Source: Ambulatory Visit | Attending: Nephrology | Admitting: Nephrology

## 2021-06-15 ENCOUNTER — Other Ambulatory Visit: Payer: Self-pay

## 2021-06-15 ENCOUNTER — Encounter (HOSPITAL_COMMUNITY): Payer: Self-pay

## 2021-06-15 VITALS — BP 167/70 | HR 50 | Temp 97.9°F | Resp 14

## 2021-06-15 DIAGNOSIS — D509 Iron deficiency anemia, unspecified: Secondary | ICD-10-CM | POA: Diagnosis not present

## 2021-06-15 DIAGNOSIS — N1832 Chronic kidney disease, stage 3b: Secondary | ICD-10-CM | POA: Diagnosis not present

## 2021-06-15 DIAGNOSIS — D631 Anemia in chronic kidney disease: Secondary | ICD-10-CM | POA: Diagnosis not present

## 2021-06-15 LAB — HEPATIC FUNCTION PANEL
ALT: 15 U/L (ref 0–44)
AST: 24 U/L (ref 15–41)
Albumin: 3.8 g/dL (ref 3.5–5.0)
Alkaline Phosphatase: 89 U/L (ref 38–126)
Bilirubin, Direct: 0.1 mg/dL (ref 0.0–0.2)
Indirect Bilirubin: 0.1 mg/dL — ABNORMAL LOW (ref 0.3–0.9)
Total Bilirubin: 0.2 mg/dL — ABNORMAL LOW (ref 0.3–1.2)
Total Protein: 7.7 g/dL (ref 6.5–8.1)

## 2021-06-15 LAB — TSH: TSH: 2.779 u[IU]/mL (ref 0.350–4.500)

## 2021-06-15 LAB — POCT HEMOGLOBIN-HEMACUE: Hemoglobin: 10.3 g/dL — ABNORMAL LOW (ref 12.0–15.0)

## 2021-06-15 LAB — LIPID PANEL
Cholesterol: 140 mg/dL (ref 0–200)
HDL: 56 mg/dL (ref >40)
LDL Cholesterol: 62 mg/dL (ref 0–99)
Total CHOL/HDL Ratio: 2.5 RATIO
Triglycerides: 111 mg/dL (ref <150)
VLDL: 22 mg/dL (ref 0–40)

## 2021-06-15 MED ORDER — EPOETIN ALFA-EPBX 3000 UNIT/ML IJ SOLN: 3000.0000 [IU] | Freq: Once | INTRAMUSCULAR | Status: DC

## 2021-06-22 ENCOUNTER — Ambulatory Visit (INDEPENDENT_AMBULATORY_CARE_PROVIDER_SITE_OTHER): Payer: Medicare Other | Admitting: *Deleted

## 2021-06-22 DIAGNOSIS — I10 Essential (primary) hypertension: Secondary | ICD-10-CM

## 2021-06-22 DIAGNOSIS — I2510 Atherosclerotic heart disease of native coronary artery without angina pectoris: Secondary | ICD-10-CM

## 2021-06-22 NOTE — Chronic Care Management (AMB) (Signed)
Chronic Care Management   CCM RN Visit Note  06/22/2021 Name: [name] [name] MRN: [id number] DOB: 1941-03-24  Subjective: [name] [name] is a 80 y.o. year old female who is a primary care patient of Fayrene Helper, MD. The care management team was consulted for assistance with disease management and care coordination needs.    Engaged with patient by telephone for follow up visit in response to provider referral for case management and/or care coordination services.   Consent to Services:  The patient was given information about Chronic Care Management services, agreed to services, and gave verbal consent prior to initiation of services.  Please see initial visit note for detailed documentation.   Patient agreed to services and verbal consent obtained.   Assessment: Review of patient past medical history, allergies, medications, health status, including review of consultants reports, laboratory and other test data, was performed as part of comprehensive evaluation and provision of chronic care management services.   SDOH (Social Determinants of Health) assessments and interventions performed:    CCM Care Plan  Allergies  Allergen Reactions   Tramadol Itching   Lisinopril Cough   Clonidine Derivatives Other (See Comments)    Zombie BP dropped    Outpatient Encounter Medications as of 06/22/2021  Medication Sig Note   acetaminophen (TYLENOL) 500 MG tablet Take 1,000 mg by mouth every 6 (six) hours as needed for mild pain or headache.     amLODipine (NORVASC) 5 MG tablet TAKE ONE TABLET BY MOUTH DAILY.    aspirin 81 MG tablet Take 1 tablet (81 mg total) by mouth daily. Restart on 03/02/18    cetirizine (ZYRTEC) 10 MG tablet TAKE ONE TABLET BY MOUTH DAILY    ferrous sulfate 325 (65 FE) MG tablet Take 325 mg by mouth daily with breakfast. 04/21/2021: On Hold   fish oil-omega-3 fatty acids 1000 MG capsule Take 1 g by mouth daily.    fluticasone (FLONASE) 50 MCG/ACT nasal spray  USE 2 SPRAYS IN EACH NOSTRIL ONCE DAILY. SHAKE GENTLY BEFORE USING. (Patient taking differently: Place 1 spray into both nostrils daily as needed for allergies.)    hydrALAZINE (APRESOLINE) 100 MG tablet Take 100 mg by mouth 2 (two) times daily.    latanoprost (XALATAN) 0.005 % ophthalmic solution Place 1 drop into both eyes at bedtime.    levothyroxine (SYNTHROID) 88 MCG tablet Take 1 tablet (88 mcg total) by mouth daily.    metoprolol succinate (TOPROL-XL) 100 MG 24 hr tablet TAKE ONE TABLET BY MOUTH DAILY. TAKE WITH OR IMMEDIATELY FOLLOWING A MEAL.    Misc. Devices (WALL GRAB BAR) MISC Grab bar for shower x 2 DX high fall risk    nitroGLYCERIN (NITROSTAT) 0.4 MG SL tablet DISSOLVE ONE TABLET UNDER TONGUE EVERY 5 MINUTES UP TO 3 DOSES AS NEEDED FOR CHEST PAIN.    olmesartan (BENICAR) 20 MG tablet Take 20 mg by mouth daily.    omeprazole (PRILOSEC) 20 MG capsule TAKE ONE CAPSULE BY MOUTH ONCE DAILY    Polyvinyl Alcohol-Povidone (REFRESH OP) Place 1 drop into both eyes at bedtime.    rosuvastatin (CRESTOR) 10 MG tablet TAKE ONE TABLET BY MOUTH DAILY.    trolamine salicylate (ASPERCREME) 10 % cream Apply 1 application topically as needed for muscle pain.    UNABLE TO FIND Shower chair x 1  DX M54.36    No facility-administered encounter medications on file as of 06/22/2021.    Patient Active Problem List   Diagnosis Date Noted   Benign  neuroendocrine tumor of duodenum 06/10/2021   Neuroendocrine tumor 06/10/2021   Abnormal findings on esophagogastroduodenoscopy (EGD) 06/10/2021   Iron deficiency anemia 06/10/2021   Transportation insecurity due to excessive travel time to destination 05/13/2021   Carpal tunnel syndrome, right upper limb 11/02/2020   Numbness of fingers 10/26/2020   Gout 08/04/2018   Hyperuricemia 08/04/2018   Osteoarthritis of right knee 03/04/2018   CKD (chronic kidney disease) stage 3, GFR 30-59 ml/min (HCC) 02/26/2018   Carotid stenosis 02/01/2018   Lumbar adjacent  segment disease with spondylolisthesis 02/01/2018   Annual physical exam 12/16/2015   GERD (gastroesophageal reflux disease) 11/10/2013   Bradycardia, sinus 21/30/8657   Metabolic syndrome X 84/69/6295   CAD (coronary artery disease)    Ejection fraction    CKD (chronic kidney disease) 07/11/2011   Hypothyroidism 01/31/2009   CORONARY ARTERY BYPASS GRAFT, HX OF 01/31/2009   Carotid artery disease ([place]) 11/30/2008   Allergic rhinitis 08/12/2008   Hyperlipidemia LDL goal <70 10/07/2007   Obesity (BMI 30.0-34.9) 10/07/2007   Essential hypertension, malignant 10/07/2007    Conditions to be addressed/monitored:CAD and HTN  Care Plan : RN Care Manager Plan of Care  Updates made by Kassie Mends, RN since 06/22/2021 12:00 AM     Problem: No plan of care established for management of chronic disease states  (HTN, CAD)   Priority: High     Long-Range Goal: Development of plan of care for chronic disease management  (HTN, CAD)   Start Date: 06/22/2021  Expected End Date: 12/19/2021  Priority: High  Note:   Current Barriers:  Knowledge Deficits related to plan of care for management of CAD and HTN  Ineffective Self Health Maintenance in a patient with CAD- patient reports she lives with spouse, is overall independent with ADL, IADL's, continues to drive.  Does not have advanced directives and will look to see if she received them in the mail. Pt reports smoke detectors are now working, reports she is going to call for a new prescription for nitroglycerin (she has on hand but not sure if expired) Reports received flu vaccine. Patient reports she does not always eat heart healthy diet but she does try as much as possible to eat healthy. Knowledge Deficits related to basic understanding of hypertension pathophysiology and self care management- pt reports she has blood pressure cuff and checks blood pressure weekly at least, reports she did receive flu vaccine.  Reports receives retacrit  injection for iron deficiency anemia related to chronic blood loss and continues q 2 weeks, pt reports she had PET scan on 11/18 and "shows no cancer" and is going to have endoscopy on 2/16 and "have lesions removed out of my stomach"   Does not adhere to provider recommendations re: does not always eat low sodium diet RNCM Clinical Goal(s):  Patient will verbalize understanding of plan for management of CAD and HTN as evidenced by pt report, review of EHR and  through collaboration with RN Care manager, provider, and care team.   Interventions: 1:1 collaboration with primary care provider regarding development and update of comprehensive plan of care as evidenced by provider attestation and co-signature Inter-disciplinary care team collaboration (see longitudinal plan of care) Evaluation of current treatment plan related to  self management and patient's adherence to plan as established by provider  CAD Interventions: (Status:  Goal on track:  Yes.) Long Term Goal Assessed understanding of CAD diagnosis Medications reviewed including medications utilized in CAD treatment plan Provided education on importance of  blood pressure control in management of CAD Counseled on importance of regular laboratory monitoring as prescribed Reviewed Importance of taking all medications as prescribed Reviewed Importance of attending all scheduled provider appointments Reinforced heart healthy diet Reinforced completing advanced directives Reinforced good handwashing, wearing mask as needed  Hypertension Interventions:  (Status:  Goal on track:  Yes.) Long Term Goal Last practice recorded BP readings:  BP Readings from Last 3 Encounters:  06/15/21 (!) 167/70  06/09/21 (!) 172/68  06/01/21 (!) 171/59  Most recent eGFR/CrCl:  Lab Results  Component Value Date   EGFR 39 (L) 05/10/2021    No components found for: CRCL  Evaluation of current treatment plan related to hypertension self management and  patient's adherence to plan as established by provider Reviewed medications with patient and discussed importance of compliance Discussed plans with patient for ongoing care management follow up and provided patient with direct contact information for care management team Discussed complications of poorly controlled blood pressure such as heart disease, stroke, circulatory complications, vision complications, kidney impairment, sexual dysfunction  Reinforced low sodium diet Reviewed all upcoming scheduled appointments Pain assessment completed  Patient Goals/Self-Care Activities: Take medications as prescribed   Attend all scheduled provider appointments Perform all self care activities independently  Call provider office for new concerns or questions  check blood pressure 3 times per week take blood pressure log to all doctor appointments call doctor for signs and symptoms of high blood pressure keep all doctor appointments take medications for blood pressure exactly as prescribed eat more whole grains, fruits and vegetables, lean meats and healthy fats Look for advanced directives packet that was mailed to you Practice good handwashing, wear a mask as needed and avoid sick people Eat heart healthy diet, avoid trans/ saturated fats      Plan:Telephone follow up appointment with care management team member scheduled for:  08/17/2021  Jacqlyn Larsen [facility], BSN RN Case Manager [facility] [phone]

## 2021-06-22 NOTE — Patient Instructions (Signed)
Visit Information  Thank you for taking time to visit with me today. Please don't hesitate to contact me if I can be of assistance to you before our next scheduled telephone appointment.  Following are the goals we discussed today:  Take medications as prescribed   Attend all scheduled provider appointments Perform all self care activities independently  Call provider office for new concerns or questions  check blood pressure 3 times per week take blood pressure log to all doctor appointments call doctor for signs and symptoms of high blood pressure keep all doctor appointments take medications for blood pressure exactly as prescribed eat more whole grains, fruits and vegetables, lean meats and healthy fats Look for advanced directives packet that was mailed to you Practice good handwashing, wear a mask as needed and avoid sick people Eat heart healthy diet, avoid trans/ saturated fats  Our next appointment is on 08/17/2021 at 130 pm  Please call the care guide team at [phone] if you need to cancel or reschedule your appointment.   If you are experiencing a Mental Health or [place] or need someone to talk to, please call the [place] National Suicide Prevention Lifeline: 774-069-8890 or TTY: 843-568-9389 TTY 843-819-6462) to talk to a trained counselor call 1-800-273-TALK (toll free, 24 hour hotline) go to [facility] [address], [place] [phone]) call the [place]: [phone] call 911   The patient verbalized understanding of instructions, educational materials, and care plan provided today and declined offer to receive copy of patient instructions, educational materials, and care plan.   Jacqlyn Larsen [facility], BSN RN Case Manager [facility] [phone]

## 2021-06-27 ENCOUNTER — Other Ambulatory Visit: Payer: Self-pay | Admitting: Cardiology

## 2021-06-29 ENCOUNTER — Encounter (HOSPITAL_COMMUNITY)
Admission: RE | Admit: 2021-06-29 | Discharge: 2021-06-29 | Disposition: A | Discharge status: Home / Self Care | Payer: Medicare Other | Source: Ambulatory Visit | Attending: Nephrology | Admitting: Nephrology

## 2021-06-29 ENCOUNTER — Encounter (HOSPITAL_COMMUNITY): Payer: Self-pay

## 2021-06-29 ENCOUNTER — Telehealth: Payer: Self-pay | Admitting: Gastroenterology

## 2021-06-29 ENCOUNTER — Other Ambulatory Visit: Payer: Self-pay

## 2021-06-29 VITALS — BP 178/56 | HR 52 | Temp 98.1°F | Resp 18 | Ht 61.0 in | Wt 153.0 lb

## 2021-06-29 DIAGNOSIS — D509 Iron deficiency anemia, unspecified: Secondary | ICD-10-CM | POA: Diagnosis not present

## 2021-06-29 DIAGNOSIS — N1832 Chronic kidney disease, stage 3b: Secondary | ICD-10-CM | POA: Diagnosis not present

## 2021-06-29 DIAGNOSIS — N1831 Chronic kidney disease, stage 3a: Secondary | ICD-10-CM

## 2021-06-29 DIAGNOSIS — D631 Anemia in chronic kidney disease: Secondary | ICD-10-CM | POA: Diagnosis not present

## 2021-06-29 LAB — BASIC METABOLIC PANEL
Anion gap: 9 (ref 5–15)
BUN: 35 mg/dL — ABNORMAL HIGH (ref 8–23)
CO2: 21 mmol/L — ABNORMAL LOW (ref 22–32)
Calcium: 10 mg/dL (ref 8.9–10.3)
Chloride: 106 mmol/L (ref 98–111)
Creatinine, Ser: 1.48 mg/dL — ABNORMAL HIGH (ref 0.44–1.00)
GFR, Estimated: 36 mL/min — ABNORMAL LOW (ref >60)
Glucose, Bld: 84 mg/dL (ref 70–99)
Potassium: 4.2 mmol/L (ref 3.5–5.1)
Sodium: 136 mmol/L (ref 135–145)

## 2021-06-29 LAB — PROTIME-INR
INR: 1 (ref 0.8–1.2)
Prothrombin Time: 13.5 seconds (ref 11.4–15.2)

## 2021-06-29 LAB — CBC WITH DIFFERENTIAL/PLATELET
Abs Immature Granulocytes: 0.01 10*3/uL (ref 0.00–0.07)
Basophils Absolute: 0.1 10*3/uL (ref 0.0–0.1)
Basophils Relative: 1 %
Eosinophils Absolute: 0.4 10*3/uL (ref 0.0–0.5)
Eosinophils Relative: 8 %
HCT: 32.3 % — ABNORMAL LOW (ref 36.0–46.0)
Hemoglobin: 10 g/dL — ABNORMAL LOW (ref 12.0–15.0)
Immature Granulocytes: 0 %
Lymphocytes Relative: 22 %
Lymphs Abs: 1.2 10*3/uL (ref 0.7–4.0)
MCH: 27.5 pg (ref 26.0–34.0)
MCHC: 31 g/dL (ref 30.0–36.0)
MCV: 88.7 fL (ref 80.0–100.0)
Monocytes Absolute: 0.4 10*3/uL (ref 0.1–1.0)
Monocytes Relative: 8 %
Neutro Abs: 3.2 10*3/uL (ref 1.7–7.7)
Neutrophils Relative %: 61 %
Platelets: 222 10*3/uL (ref 150–400)
RBC: 3.64 MIL/uL — ABNORMAL LOW (ref 3.87–5.11)
RDW: 16.2 % — ABNORMAL HIGH (ref 11.5–15.5)
WBC: 5.3 10*3/uL (ref 4.0–10.5)
nRBC: 0 % (ref 0.0–0.2)

## 2021-06-29 LAB — POCT HEMOGLOBIN-HEMACUE: Hemoglobin: 10.1 g/dL — ABNORMAL LOW (ref 12.0–15.0)

## 2021-06-29 MED ORDER — EPOETIN ALFA-EPBX 3000 UNIT/ML IJ SOLN: 3000.0000 [IU] | Freq: Once | INTRAMUSCULAR | Status: DC

## 2021-06-29 NOTE — Telephone Encounter (Signed)
Patient returned your call and is anxious to speak with you.  Please call.  Thank you.

## 2021-06-29 NOTE — Telephone Encounter (Signed)
See results note 12/29

## 2021-07-01 DIAGNOSIS — I2510 Atherosclerotic heart disease of native coronary artery without angina pectoris: Secondary | ICD-10-CM

## 2021-07-01 DIAGNOSIS — I10 Essential (primary) hypertension: Secondary | ICD-10-CM

## 2021-07-04 ENCOUNTER — Encounter: Payer: Self-pay | Admitting: Cardiology

## 2021-07-04 ENCOUNTER — Ambulatory Visit (INDEPENDENT_AMBULATORY_CARE_PROVIDER_SITE_OTHER): Payer: Medicare Other | Admitting: Cardiology

## 2021-07-04 VITALS — BP 164/70 | HR 70 | Ht 61.0 in | Wt 158.0 lb

## 2021-07-04 DIAGNOSIS — E782 Mixed hyperlipidemia: Secondary | ICD-10-CM

## 2021-07-04 DIAGNOSIS — N1832 Chronic kidney disease, stage 3b: Secondary | ICD-10-CM

## 2021-07-04 DIAGNOSIS — I25119 Atherosclerotic heart disease of native coronary artery with unspecified angina pectoris: Secondary | ICD-10-CM

## 2021-07-04 DIAGNOSIS — I10 Essential (primary) hypertension: Secondary | ICD-10-CM

## 2021-07-04 NOTE — Progress Notes (Signed)
Cardiology Office Note  Date: 07/04/2021   ID: [name], [name] 1941/06/30, MRN [id number]  PCP:  Fayrene Helper, MD  Cardiologist:  Rozann Lesches, MD Electrophysiologist:  None   Chief Complaint  Patient presents with   Cardiac follow-up    History of Present Illness: [name] [name] is a 81 y.o. female last seen in April 2022.  She is here for a follow-up visit.  She does not describe any angina or nitroglycerin use.  We went over her medications which are noted below.  She did ultimately go back on Benicar after discussing things with her PCP.  Systolic blood pressure at home is generally in the 130s to 140s per review.  She has a component of whitecoat hypertension as well.  Echocardiogram in April of last year revealed LVEF 65 to 70% with moderate diastolic dysfunction, severe biatrial enlargement, mild aortic stenosis with mean gradient 12 mmHg, and moderate to severe pulmonary hypertension with estimated PASP 60 mmHg.  She reports NYHA class II dyspnea with most activities.  Her weight has been relatively stable as well.  She continues to follow with nephrology.  Past Medical History:  Diagnosis Date   CAD (coronary artery disease)    Multivessel status post CABG October 2005   Carotid artery disease ([place])    CKD (chronic kidney disease), stage III ([place])    Essential hypertension    Glaucoma    Laser surgery 2010   Hyperlipidemia    Hypothyroidism    Iron deficiency anemia    Obesity     Past Surgical History:  Procedure Laterality Date   BIOPSY  04/27/2021   Procedure: BIOPSY;  Surgeon: Milus Banister, MD;  Location: [facility];  Service: Endoscopy;;   COLONOSCOPY N/A 02/04/2013   Procedure: COLONOSCOPY;  Surgeon: Rogene Houston, MD;  [facility];  Service: Endoscopy;  Laterality: N/A;  1015   COLONOSCOPY N/A 12/21/2016   Procedure: COLONOSCOPY;  Surgeon: Rogene Houston, MD;  [facility];  Service: Endoscopy;  Laterality:  N/A;   COLONOSCOPY WITH PROPOFOL N/A 03/22/2021   Procedure: COLONOSCOPY WITH PROPOFOL;  Surgeon: Rogene Houston, MD;  [facility];  Service: Endoscopy;  Laterality: N/A;  8:15   CORONARY ARTERY BYPASS GRAFT  October 2005   Dr. Roxy Manns - LIMA to LAD, SVG to OM 2 and OM 3, SVG to PDA   ESOPHAGOGASTRODUODENOSCOPY N/A 12/21/2016   Procedure: ESOPHAGOGASTRODUODENOSCOPY (EGD);  Surgeon: Rogene Houston, MD;  [facility];  Service: Endoscopy;  Laterality: N/A;  11:10   ESOPHAGOGASTRODUODENOSCOPY N/A 02/27/2018   Procedure: ESOPHAGOGASTRODUODENOSCOPY (EGD);  Surgeon: Rogene Houston, MD;  [facility];  Service: Endoscopy;  Laterality: N/A;   ESOPHAGOGASTRODUODENOSCOPY (EGD) WITH PROPOFOL N/A 04/27/2021   Procedure: ESOPHAGOGASTRODUODENOSCOPY (EGD) WITH PROPOFOL;  Surgeon: Milus Banister, MD;  [facility]: [facility];  Service: Endoscopy;  Laterality: N/A;   EUS N/A 04/27/2021   Procedure: UPPER ENDOSCOPIC ULTRASOUND (EUS) RADIAL;  Surgeon: Milus Banister, MD;  Location: [facility];  Service: Endoscopy;  Laterality: N/A;   EYE SURGERY  2010   laser treatment to both eyes for glaucoma   GIVENS CAPSULE STUDY N/A 03/07/2018   Procedure: GIVENS CAPSULE STUDY;  Surgeon: Rogene Houston, MD;  [facility];  Service: Endoscopy;  Laterality: N/A;   GIVENS CAPSULE STUDY N/A 02/05/2020   Procedure: GIVENS CAPSULE STUDY;  Surgeon: Rogene Houston, MD;  [facility];  Service: Endoscopy;  Laterality: N/A;  730   POLYPECTOMY  03/22/2021   Procedure: POLYPECTOMY;  Surgeon: Rogene Houston, MD;  [facility];  Service: Endoscopy;;    Current Outpatient Medications  Medication Sig Dispense Refill   acetaminophen (TYLENOL) 500 MG tablet Take 1,000 mg by mouth every 6 (six) hours as needed for mild pain or headache.      amLODipine (NORVASC) 5 MG tablet TAKE ONE TABLET BY MOUTH DAILY. 90 tablet 1   aspirin 81 MG tablet Take 1 tablet (81 mg total)  by mouth daily. Restart on 03/02/18 30 tablet 0   cetirizine (ZYRTEC) 10 MG tablet TAKE ONE TABLET BY MOUTH DAILY 90 tablet 1   ferrous sulfate 325 (65 FE) MG tablet Take 325 mg by mouth daily with breakfast.     fish oil-omega-3 fatty acids 1000 MG capsule Take 1 g by mouth daily.     fluticasone (FLONASE) 50 MCG/ACT nasal spray USE 2 SPRAYS IN EACH NOSTRIL ONCE DAILY. SHAKE GENTLY BEFORE USING. (Patient taking differently: Place 1 spray into both nostrils daily as needed for allergies.) 48 g 1   hydrALAZINE (APRESOLINE) 100 MG tablet Take 100 mg by mouth 2 (two) times daily.     latanoprost (XALATAN) 0.005 % ophthalmic solution Place 1 drop into both eyes at bedtime.     levothyroxine (SYNTHROID) 88 MCG tablet Take 1 tablet (88 mcg total) by mouth daily. 30 tablet 3   metoprolol succinate (TOPROL-XL) 100 MG 24 hr tablet TAKE ONE TABLET BY MOUTH DAILY. TAKE WITH OR IMMEDIATELY FOLLOWING A MEAL. 90 tablet 1   Misc. Devices (WALL GRAB BAR) MISC Grab bar for shower x 2 DX high fall risk 2 each 0   nitroGLYCERIN (NITROSTAT) 0.4 MG SL tablet DISSOLVE ONE TABLET UNDER TONGUE EVERY 5 MINUTES UP TO 3 DOSES AS NEEDED FOR CHEST PAIN 25 tablet 3   olmesartan (BENICAR) 20 MG tablet Take 20 mg by mouth daily.     omeprazole (PRILOSEC) 20 MG capsule TAKE ONE CAPSULE BY MOUTH ONCE DAILY 90 capsule 3   Polyvinyl Alcohol-Povidone (REFRESH OP) Place 1 drop into both eyes at bedtime.     rosuvastatin (CRESTOR) 10 MG tablet TAKE ONE TABLET BY MOUTH DAILY. 90 tablet 3   trolamine salicylate (ASPERCREME) 10 % cream Apply 1 application topically as needed for muscle pain.     UNABLE TO FIND Shower chair x 1  DX M54.36 1 each 0   No current facility-administered medications for this visit.   Allergies:  Tramadol, Lisinopril, and Clonidine derivatives   ROS: No palpitations.  Hearing loss.  Uses a cane.  Physical Exam: VS:  BP (!) 164/70    Pulse 70    Ht 5\' 1"  (1.549 m)    Wt 158 lb (71.7 kg)    SpO2 96%    BMI  29.85 kg/m , BMI Body mass index is 29.85 kg/m.  Wt Readings from Last 3 Encounters:  07/04/21 158 lb (71.7 kg)  06/29/21 153 lb (69.4 kg)  06/09/21 153 lb (69.4 kg)    General: Patient appears comfortable at rest. HEENT: Conjunctiva and lids normal, wearing a mask. Neck: Supple, no elevated JVP or carotid bruits, no thyromegaly. Lungs: Clear to auscultation, nonlabored breathing at rest. Cardiac: Regular rate and rhythm, no S3, 3/6 systolic murmur. Abdomen: Soft, nontender, bowel sounds present. Extremities: 1+ lower leg edema, distal pulses 2+.  ECG:  An ECG dated 03/15/2021 was personally reviewed today and demonstrated:  Sinus  rhythm with nonspecific ST-T changes.  Recent Labwork: 06/15/2021: ALT 15; AST 24; TSH 2.779 06/29/2021: BUN 35; Creatinine, Ser 1.48; Hemoglobin 10.0; Platelets 222; Potassium 4.2; Sodium 136     Component Value Date/Time   CHOL 140 06/15/2021 1230   CHOL 130 10/19/2020 0832   TRIG 111 06/15/2021 1230   HDL 56 06/15/2021 1230   HDL 57 10/19/2020 0832   CHOLHDL 2.5 06/15/2021 1230   VLDL 22 06/15/2021 1230   LDLCALC 62 06/15/2021 1230   LDLCALC 54 10/19/2020 0832   LDLCALC 61 07/21/2019 0938    Other Studies Reviewed Today:  Echocardiogram 10/25/2020:  1. Left ventricular ejection fraction, by estimation, is 65 to 70%. The  left ventricle has normal function. Left ventricular endocardial border  not optimally defined to evaluate regional wall motion. There is mild left  ventricular hypertrophy. Left  ventricular diastolic parameters are consistent with Grade II diastolic  dysfunction (pseudonormalization). Elevated left atrial pressure. The  average left ventricular global longitudinal strain is 18.6 %. The global  longitudinal strain is normal.   2. Right ventricular systolic function is normal. The right ventricular  size is normal.   3. Left atrial size was severely dilated.   4. Right atrial size was severely dilated.   5. The mitral  valve is normal in structure. Trivial mitral valve  regurgitation. No evidence of mitral stenosis.   6. Tricuspid valve regurgitation is moderate.   7. The aortic valve is tricuspid. Aortic valve regurgitation is mild.  Mild aortic valve stenosis. Aortic valve mean gradient measures 12.0 mmHg.  Aortic valve peak gradient measures 28.1 mmHg. Aortic valve area, by VTI  measures 1.71 cm.   8. Moderate to severe pulmonary HTN, PASP is 60 mmHg.   9. The inferior vena cava is normal in size with greater than 50%  respiratory variability, suggesting right atrial pressure of 3 mmHg.   Assessment and Plan:  1.  Multivessel CAD status post CABG in 2005.  LVEF 65 to 70% by echocardiogram in April of last year.  She does not report any active angina.  Continue observation on aspirin, Norvasc, Crestor, Toprol-XL, and as needed nitroglycerin.  2.  Hypertension with whitecoat hypertension.  Continue current doses of hydralazine, Norvasc, Benicar, and Toprol-XL.  3.  Mixed hyperlipidemia, on Crestor.  Last LDL 62.  4.  CKD stage IIIb, creatinine 1.48.  Keep follow-up with nephrology.  Medication Adjustments/Labs and Tests Ordered: Current medicines are reviewed at length with the patient today.  Concerns regarding medicines are outlined above.   Tests Ordered: No orders of the defined types were placed in this encounter.   Medication Changes: No orders of the defined types were placed in this encounter.   Disposition:  Follow up  6 months.  Signed, Satira Sark, MD, [facility] 07/04/2021 2:12 PM    [facility] [place], [place], [place] [address] Phone: [phone]; Fax: [phone]

## 2021-07-04 NOTE — Patient Instructions (Signed)
Medication Instructions:  Continue all current medications.   Labwork: none  Testing/Procedures: none  Follow-Up: 6 months   Any Other Special Instructions Will Be Listed Below (If Applicable).   If you need a refill on your cardiac medications before your next appointment, please call your pharmacy.  

## 2021-07-10 ENCOUNTER — Other Ambulatory Visit (INDEPENDENT_AMBULATORY_CARE_PROVIDER_SITE_OTHER): Payer: Self-pay | Admitting: *Deleted

## 2021-07-10 MED ORDER — OMEPRAZOLE 20 MG PO CPDR: 20.0000 mg | DELAYED_RELEASE_CAPSULE | Freq: Every day | ORAL | 1 refills | Status: DC

## 2021-07-13 ENCOUNTER — Encounter (HOSPITAL_COMMUNITY)
Admission: RE | Admit: 2021-07-13 | Discharge: 2021-07-13 | Disposition: A | Discharge status: Home / Self Care | Payer: Medicare Other | Source: Ambulatory Visit | Attending: Nephrology | Admitting: Nephrology

## 2021-07-13 ENCOUNTER — Other Ambulatory Visit: Payer: Self-pay

## 2021-07-13 DIAGNOSIS — D631 Anemia in chronic kidney disease: Secondary | ICD-10-CM | POA: Insufficient documentation

## 2021-07-13 DIAGNOSIS — N1832 Chronic kidney disease, stage 3b: Secondary | ICD-10-CM | POA: Diagnosis not present

## 2021-07-13 LAB — POCT HEMOGLOBIN-HEMACUE: Hemoglobin: 8.8 g/dL — ABNORMAL LOW (ref 12.0–15.0)

## 2021-07-13 MED ORDER — EPOETIN ALFA-EPBX 3000 UNIT/ML IJ SOLN
INTRAMUSCULAR | Status: AC
  Filled 2021-07-13: qty 1

## 2021-07-13 MED ORDER — EPOETIN ALFA-EPBX 3000 UNIT/ML IJ SOLN
3000.0000 [IU] | Freq: Once | INTRAMUSCULAR | Status: AC
  Administered 2021-07-13: 3000 [IU] via SUBCUTANEOUS

## 2021-07-27 ENCOUNTER — Other Ambulatory Visit: Payer: Self-pay

## 2021-07-27 ENCOUNTER — Encounter (HOSPITAL_COMMUNITY)
Admission: RE | Admit: 2021-07-27 | Discharge: 2021-07-27 | Disposition: A | Discharge status: Home / Self Care | Payer: Medicare Other | Source: Ambulatory Visit | Attending: Nephrology | Admitting: Nephrology

## 2021-07-27 DIAGNOSIS — N1832 Chronic kidney disease, stage 3b: Secondary | ICD-10-CM | POA: Diagnosis not present

## 2021-07-27 DIAGNOSIS — D631 Anemia in chronic kidney disease: Secondary | ICD-10-CM | POA: Diagnosis not present

## 2021-07-27 LAB — POCT HEMOGLOBIN-HEMACUE: Hemoglobin: 8.6 g/dL — ABNORMAL LOW (ref 12.0–15.0)

## 2021-07-27 MED ORDER — EPOETIN ALFA-EPBX 3000 UNIT/ML IJ SOLN
3000.0000 [IU] | Freq: Once | INTRAMUSCULAR | Status: AC
  Administered 2021-07-27: 3000 [IU] via SUBCUTANEOUS
  Filled 2021-07-27: qty 1

## 2021-07-31 ENCOUNTER — Other Ambulatory Visit: Payer: Self-pay | Admitting: Family Medicine

## 2021-08-03 DIAGNOSIS — E038 Other specified hypothyroidism: Secondary | ICD-10-CM | POA: Diagnosis not present

## 2021-08-04 LAB — TSH: TSH: 14.8 u[IU]/mL — ABNORMAL HIGH (ref 0.450–4.500)

## 2021-08-09 ENCOUNTER — Encounter (HOSPITAL_COMMUNITY): Payer: Self-pay | Admitting: Gastroenterology

## 2021-08-10 ENCOUNTER — Other Ambulatory Visit: Payer: Self-pay

## 2021-08-10 ENCOUNTER — Encounter: Payer: Self-pay | Admitting: Family Medicine

## 2021-08-10 ENCOUNTER — Ambulatory Visit (INDEPENDENT_AMBULATORY_CARE_PROVIDER_SITE_OTHER): Payer: Medicare Other | Admitting: Family Medicine

## 2021-08-10 VITALS — BP 180/72 | HR 62 | Ht 61.0 in | Wt 156.1 lb

## 2021-08-10 DIAGNOSIS — K219 Gastro-esophageal reflux disease without esophagitis: Secondary | ICD-10-CM | POA: Diagnosis not present

## 2021-08-10 DIAGNOSIS — E038 Other specified hypothyroidism: Secondary | ICD-10-CM

## 2021-08-10 DIAGNOSIS — I10 Essential (primary) hypertension: Secondary | ICD-10-CM | POA: Diagnosis not present

## 2021-08-10 DIAGNOSIS — Z13220 Encounter for screening for lipoid disorders: Secondary | ICD-10-CM

## 2021-08-10 DIAGNOSIS — Z1231 Encounter for screening mammogram for malignant neoplasm of breast: Secondary | ICD-10-CM

## 2021-08-10 DIAGNOSIS — E785 Hyperlipidemia, unspecified: Secondary | ICD-10-CM | POA: Diagnosis not present

## 2021-08-10 DIAGNOSIS — M1A9XX1 Chronic gout, unspecified, with tophus (tophi): Secondary | ICD-10-CM | POA: Diagnosis not present

## 2021-08-10 MED ORDER — AMLODIPINE BESYLATE 5 MG PO TABS: ORAL_TABLET | ORAL | 5 refills | Status: DC

## 2021-08-10 MED ORDER — LEVOTHYROXINE SODIUM 100 MCG PO TABS: 100.0000 ug | ORAL_TABLET | Freq: Every day | ORAL | 3 refills | Status: DC

## 2021-08-10 NOTE — Patient Instructions (Addendum)
F/u in 2 months, call if you need me sooner  Increase amlodipine 5 mg to one twice daily,  blood pressure is high  Please schedule June mammogram appointment at checkout    Take synthroid 88 mcg 1.5 tabs daily till done, then new script is for 100 mcg one daily, take at 7 am in the morning on empty stomach, wait 2 hrs before eating   Fasting lipid, cmp and EGFr and tSH 5 days before follow up  Thanks for choosing [facility], we consider it a privelige to serve you.

## 2021-08-14 ENCOUNTER — Encounter: Payer: Self-pay | Admitting: Family Medicine

## 2021-08-14 NOTE — Assessment & Plan Note (Signed)
Under corrected, dose increasre  In medication and review in 2 months

## 2021-08-14 NOTE — Assessment & Plan Note (Signed)
No recent flare, stable and controlled

## 2021-08-14 NOTE — Progress Notes (Signed)
° °  [name] [name]     MRN: [id number]      DOB: 07-Nov-1940   HPI Ms. Scharrer is here for follow up and re-evaluation of chronic medical conditions, medication management and review of any available recent lab and radiology data.  Preventive health is updated, specifically  Cancer screening and Immunization.   Has UES scheduled next week The PT denies any adverse reactions to current medications since the last visit.     ROS Denies recent fever or chills. Denies sinus pressure, nasal congestion, ear pain or sore throat. Denies chest congestion, productive cough or wheezing. Denies chest pains, palpitations and leg swelling Denies abdominal pain, nausea, vomiting,diarrhea or constipation.   Denies dysuria, frequency, hesitancy or incontinence. Denies joint pain, swelling and limitation in mobility. Denies headaches, seizures, numbness, or tingling. Denies depression, anxiety or insomnia. Denies skin break down or rash.   PE  BP (!) 180/72    Pulse 62    Ht 5\' 1"  (1.549 m)    Wt 156 lb 1.9 oz (70.8 kg)    SpO2 92%    BMI 29.50 kg/m   Patient alert and oriented and in no cardiopulmonary distress.  HEENT: No facial asymmetry, EOMI,     Neck supple .  Chest: Clear to auscultation bilaterally.  CVS: S1, S2 no murmurs, no S3.Regular rate.  ABD: Soft non tender.   Ext: No edema  MS: decreased  ROM spine, shoulders, hips and knees.  Skin: Intact, no ulcerations or rash noted.  Psych: Good eye contact, normal affect. Memory intact not anxious or depressed appearing.  CNS: CN 2-12 intact, power,  normal throughout.no focal deficits noted.   Assessment & Plan  Hypothyroidism Under corrected, dose increasre  In medication and review in 2 months  Essential hypertension, malignant Uncontrolled, dose increase in amlodipine and review in 2 months DASH diet and commitment to daily physical activity for a minimum of 30 minutes discussed and encouraged, as a part of hypertension  management. The importance of attaining a healthy weight is also discussed.  BP/Weight 08/10/2021 07/27/2021 07/13/2021 07/04/2021 06/29/2021 06/15/2021 64/09/345  Systolic BP 425 956 [phone] 884  Diastolic BP 72 63 61 70 56 70 68  Wt. (Lbs) 156.12 - - 158 153 - 153  BMI 29.5 - - 29.85 28.91 - 28.91       Hyperlipidemia LDL goal <70 Hyperlipidemia:Low fat diet discussed and encouraged.   Lipid Panel  Lab Results  Component Value Date   CHOL 140 06/15/2021   HDL 56 06/15/2021   LDLCALC 62 06/15/2021   TRIG 111 06/15/2021   CHOLHDL 2.5 06/15/2021     Controlled, no change in medication   GERD (gastroesophageal reflux disease) Controlled, no change in medication   Gout No recent flare, stable and controlled

## 2021-08-14 NOTE — Assessment & Plan Note (Signed)
Controlled, no change in medication  

## 2021-08-14 NOTE — Assessment & Plan Note (Signed)
Hyperlipidemia:Low fat diet discussed and encouraged.   Lipid Panel  Lab Results  Component Value Date   CHOL 140 06/15/2021   HDL 56 06/15/2021   LDLCALC 62 06/15/2021   TRIG 111 06/15/2021   CHOLHDL 2.5 06/15/2021     Controlled, no change in medication

## 2021-08-14 NOTE — Assessment & Plan Note (Signed)
Uncontrolled, dose increase in amlodipine and review in 2 months DASH diet and commitment to daily physical activity for a minimum of 30 minutes discussed and encouraged, as a part of hypertension management. The importance of attaining a healthy weight is also discussed.  BP/Weight 08/10/2021 07/27/2021 07/13/2021 07/04/2021 06/29/2021 06/15/2021 63/01/4535  Systolic BP 468 032 122 482 500 370 488  Diastolic BP 72 63 61 70 56 70 68  Wt. (Lbs) 156.12 - - 158 153 - 153  BMI 29.5 - - 29.85 28.91 - 28.91

## 2021-08-15 ENCOUNTER — Encounter (HOSPITAL_COMMUNITY)
Admission: RE | Admit: 2021-08-15 | Discharge: 2021-08-15 | Disposition: A | Discharge status: Home / Self Care | Payer: Medicare Other | Source: Ambulatory Visit | Attending: Nephrology | Admitting: Nephrology

## 2021-08-15 DIAGNOSIS — D631 Anemia in chronic kidney disease: Secondary | ICD-10-CM | POA: Diagnosis not present

## 2021-08-15 DIAGNOSIS — N1832 Chronic kidney disease, stage 3b: Secondary | ICD-10-CM | POA: Diagnosis not present

## 2021-08-15 LAB — CBC WITH DIFFERENTIAL/PLATELET
Abs Immature Granulocytes: 0.01 10*3/uL (ref 0.00–0.07)
Basophils Absolute: 0.1 10*3/uL (ref 0.0–0.1)
Basophils Relative: 1 %
Eosinophils Absolute: 0.4 10*3/uL (ref 0.0–0.5)
Eosinophils Relative: 8 %
HCT: 27.9 % — ABNORMAL LOW (ref 36.0–46.0)
Hemoglobin: 8.8 g/dL — ABNORMAL LOW (ref 12.0–15.0)
Immature Granulocytes: 0 %
Lymphocytes Relative: 25 %
Lymphs Abs: 1.1 10*3/uL (ref 0.7–4.0)
MCH: 29.8 pg (ref 26.0–34.0)
MCHC: 31.5 g/dL (ref 30.0–36.0)
MCV: 94.6 fL (ref 80.0–100.0)
Monocytes Absolute: 0.4 10*3/uL (ref 0.1–1.0)
Monocytes Relative: 9 %
Neutro Abs: 2.5 10*3/uL (ref 1.7–7.7)
Neutrophils Relative %: 57 %
Platelets: 194 10*3/uL (ref 150–400)
RBC: 2.95 MIL/uL — ABNORMAL LOW (ref 3.87–5.11)
RDW: 15.9 % — ABNORMAL HIGH (ref 11.5–15.5)
WBC: 4.3 10*3/uL (ref 4.0–10.5)
nRBC: 0 % (ref 0.0–0.2)

## 2021-08-15 LAB — RENAL FUNCTION PANEL
Albumin: 4 g/dL (ref 3.5–5.0)
Anion gap: 11 (ref 5–15)
BUN: 31 mg/dL — ABNORMAL HIGH (ref 8–23)
CO2: 19 mmol/L — ABNORMAL LOW (ref 22–32)
Calcium: 10.1 mg/dL (ref 8.9–10.3)
Chloride: 109 mmol/L (ref 98–111)
Creatinine, Ser: 1.47 mg/dL — ABNORMAL HIGH (ref 0.44–1.00)
GFR, Estimated: 36 mL/min — ABNORMAL LOW (ref >60)
Glucose, Bld: 80 mg/dL (ref 70–99)
Phosphorus: 3.6 mg/dL (ref 2.5–4.6)
Potassium: 4.5 mmol/L (ref 3.5–5.1)
Sodium: 139 mmol/L (ref 135–145)

## 2021-08-15 LAB — IRON AND TIBC
Iron: 45 ug/dL (ref 28–170)
Saturation Ratios: 11 % (ref 10.4–31.8)
TIBC: 421 ug/dL (ref 250–450)
UIBC: 376 ug/dL

## 2021-08-15 LAB — PROTEIN / CREATININE RATIO, URINE
Creatinine, Urine: 23.62 mg/dL
Protein Creatinine Ratio: 5.42 mg/mg{Cre} — ABNORMAL HIGH (ref 0.00–0.15)
Total Protein, Urine: 128 mg/dL

## 2021-08-15 LAB — POCT HEMOGLOBIN-HEMACUE: Hemoglobin: 9 g/dL — ABNORMAL LOW (ref 12.0–15.0)

## 2021-08-15 MED ORDER — EPOETIN ALFA-EPBX 3000 UNIT/ML IJ SOLN
INTRAMUSCULAR | Status: AC
  Filled 2021-08-15: qty 1

## 2021-08-15 MED ORDER — EPOETIN ALFA-EPBX 3000 UNIT/ML IJ SOLN
3000.0000 [IU] | Freq: Once | INTRAMUSCULAR | Status: AC
  Administered 2021-08-15: 3000 [IU] via SUBCUTANEOUS

## 2021-08-17 ENCOUNTER — Ambulatory Visit (HOSPITAL_COMMUNITY): Payer: Medicare Other | Admitting: Anesthesiology

## 2021-08-17 ENCOUNTER — Other Ambulatory Visit: Payer: Self-pay

## 2021-08-17 ENCOUNTER — Telehealth: Payer: Medicare Other

## 2021-08-17 ENCOUNTER — Encounter (HOSPITAL_COMMUNITY)
Admission: RE | Disposition: A | Discharge status: Home / Self Care | Payer: Self-pay | Source: Home / Self Care | Attending: Gastroenterology

## 2021-08-17 ENCOUNTER — Ambulatory Visit (HOSPITAL_BASED_OUTPATIENT_CLINIC_OR_DEPARTMENT_OTHER): Payer: Medicare Other | Admitting: Anesthesiology

## 2021-08-17 ENCOUNTER — Encounter (HOSPITAL_COMMUNITY): Payer: Self-pay | Admitting: Gastroenterology

## 2021-08-17 ENCOUNTER — Ambulatory Visit (HOSPITAL_COMMUNITY)
Admission: RE | Admit: 2021-08-17 | Discharge: 2021-08-17 | Disposition: A | Discharge status: Home / Self Care | Payer: Medicare Other | Attending: Gastroenterology | Admitting: Gastroenterology

## 2021-08-17 DIAGNOSIS — Z79899 Other long term (current) drug therapy: Secondary | ICD-10-CM | POA: Diagnosis not present

## 2021-08-17 DIAGNOSIS — I129 Hypertensive chronic kidney disease with stage 1 through stage 4 chronic kidney disease, or unspecified chronic kidney disease: Secondary | ICD-10-CM | POA: Diagnosis not present

## 2021-08-17 DIAGNOSIS — K2289 Other specified disease of esophagus: Secondary | ICD-10-CM | POA: Insufficient documentation

## 2021-08-17 DIAGNOSIS — K219 Gastro-esophageal reflux disease without esophagitis: Secondary | ICD-10-CM | POA: Diagnosis not present

## 2021-08-17 DIAGNOSIS — I6523 Occlusion and stenosis of bilateral carotid arteries: Secondary | ICD-10-CM | POA: Insufficient documentation

## 2021-08-17 DIAGNOSIS — M1990 Unspecified osteoarthritis, unspecified site: Secondary | ICD-10-CM | POA: Diagnosis not present

## 2021-08-17 DIAGNOSIS — E785 Hyperlipidemia, unspecified: Secondary | ICD-10-CM | POA: Insufficient documentation

## 2021-08-17 DIAGNOSIS — R198 Other specified symptoms and signs involving the digestive system and abdomen: Secondary | ICD-10-CM

## 2021-08-17 DIAGNOSIS — S27819A Unspecified injury of esophagus (thoracic part), initial encounter: Secondary | ICD-10-CM

## 2021-08-17 DIAGNOSIS — I899 Noninfective disorder of lymphatic vessels and lymph nodes, unspecified: Secondary | ICD-10-CM

## 2021-08-17 DIAGNOSIS — E039 Hypothyroidism, unspecified: Secondary | ICD-10-CM | POA: Insufficient documentation

## 2021-08-17 DIAGNOSIS — I252 Old myocardial infarction: Secondary | ICD-10-CM | POA: Insufficient documentation

## 2021-08-17 DIAGNOSIS — N183 Chronic kidney disease, stage 3 unspecified: Secondary | ICD-10-CM | POA: Diagnosis not present

## 2021-08-17 DIAGNOSIS — K3189 Other diseases of stomach and duodenum: Secondary | ICD-10-CM | POA: Diagnosis not present

## 2021-08-17 DIAGNOSIS — Z951 Presence of aortocoronary bypass graft: Secondary | ICD-10-CM | POA: Diagnosis not present

## 2021-08-17 DIAGNOSIS — K929 Disease of digestive system, unspecified: Secondary | ICD-10-CM | POA: Diagnosis not present

## 2021-08-17 DIAGNOSIS — D3A010 Benign carcinoid tumor of the duodenum: Secondary | ICD-10-CM | POA: Diagnosis not present

## 2021-08-17 DIAGNOSIS — D3A8 Other benign neuroendocrine tumors: Secondary | ICD-10-CM

## 2021-08-17 DIAGNOSIS — I2510 Atherosclerotic heart disease of native coronary artery without angina pectoris: Secondary | ICD-10-CM | POA: Diagnosis not present

## 2021-08-17 DIAGNOSIS — I10 Essential (primary) hypertension: Secondary | ICD-10-CM | POA: Diagnosis not present

## 2021-08-17 DIAGNOSIS — I351 Nonrheumatic aortic (valve) insufficiency: Secondary | ICD-10-CM | POA: Insufficient documentation

## 2021-08-17 DIAGNOSIS — D630 Anemia in neoplastic disease: Secondary | ICD-10-CM | POA: Diagnosis not present

## 2021-08-17 DIAGNOSIS — Z87891 Personal history of nicotine dependence: Secondary | ICD-10-CM | POA: Insufficient documentation

## 2021-08-17 HISTORY — PX: ESOPHAGOGASTRODUODENOSCOPY (EGD) WITH PROPOFOL: SHX5813

## 2021-08-17 HISTORY — PX: UPPER ESOPHAGEAL ENDOSCOPIC ULTRASOUND (EUS): SHX6562

## 2021-08-17 SURGERY — ESOPHAGOGASTRODUODENOSCOPY (EGD) WITH PROPOFOL
Anesthesia: Monitor Anesthesia Care

## 2021-08-17 MED ORDER — PROPOFOL 500 MG/50ML IV EMUL
INTRAVENOUS | Status: DC | PRN
Start: 2021-08-17 — End: 2021-08-17
  Administered 2021-08-17: 100 ug/kg/min via INTRAVENOUS

## 2021-08-17 MED ORDER — LACTATED RINGERS IV SOLN: Freq: Once | INTRAVENOUS | Status: DC

## 2021-08-17 MED ORDER — EPHEDRINE SULFATE-NACL 50-0.9 MG/10ML-% IV SOSY
PREFILLED_SYRINGE | INTRAVENOUS | Status: DC | PRN
  Administered 2021-08-17 (×2): 10 mg via INTRAVENOUS

## 2021-08-17 MED ORDER — SODIUM CHLORIDE 0.9 % IV SOLN: INTRAVENOUS | Status: DC

## 2021-08-17 MED ORDER — PROPOFOL 10 MG/ML IV BOLUS
INTRAVENOUS | Status: DC | PRN
  Administered 2021-08-17: 30 mg via INTRAVENOUS

## 2021-08-17 MED ORDER — LACTATED RINGERS IV SOLN: INTRAVENOUS | Status: DC | PRN

## 2021-08-17 SURGICAL SUPPLY — 15 items

## 2021-08-17 NOTE — Anesthesia Procedure Notes (Signed)
Procedure Name: MAC Date/Time: 08/17/2021 9:11 AM Performed by: Jenne Campus, CRNA Pre-anesthesia Checklist: Patient identified, Emergency Drugs available, Suction available and Patient being monitored Oxygen Delivery Method: Simple face mask

## 2021-08-17 NOTE — Anesthesia Preprocedure Evaluation (Addendum)
Anesthesia Evaluation  Patient identified by MRN, date of birth, ID band Patient awake    Reviewed: Allergy & Precautions, NPO status , Patient's Chart, lab work & pertinent test results, reviewed documented beta blocker date and time   Airway Mallampati: II  TM Distance: >3 FB Neck ROM: Full    Dental  (+) Missing, Caps, Poor Dentition, Dental Advisory Given   Pulmonary neg pulmonary ROS, former smoker,    Pulmonary exam normal breath sounds clear to auscultation       Cardiovascular hypertension, Pt. on medications and Pt. on home beta blockers + CAD, + Past MI, + CABG and + Peripheral Vascular Disease  Normal cardiovascular exam Rhythm:Regular Rate:Normal  MI 2005   Multivessel CABG 2005  EKG 03/15/21 NSR, RAD, inverted T waves V3-V6  Echo 10/05/20 1. Left ventricular ejection fraction, by estimation, is 65 to 70%. The left ventricle has normal function. Left ventricular endocardial border not optimally defined to evaluate regional wall motion. There is mild left ventricular hypertrophy. Left ventricular diastolic parameters are consistent with Grade II diastolic dysfunction (pseudonormalization). Elevated left atrial pressure. The  average left ventricular global longitudinal strain is 18.6 %. The global  longitudinal strain is normal.  2. Right ventricular systolic function is normal. The right ventricular size is normal.  3. Left atrial size was severely dilated.  4. Right atrial size was severely dilated.  5. The mitral valve is normal in structure. Trivial mitral valve regurgitation. No evidence of mitral stenosis.  6. Tricuspid valve regurgitation is moderate.  7. The aortic valve is tricuspid. Aortic valve regurgitation is mild.   Bilateral Carotid artery stenosis Right 60-79% Left < 50% Mild aortic valve stenosis. Aortic valve mean gradient measures 12.0 mmHg.  Aortic valve peak gradient measures 28.1 mmHg.  Aortic valve area, by VTI measures 1.71 cm.  8. Moderate to severe pulmonary HTN, PASP is 60 mmHg.  9. The inferior vena cava is normal in size with greater than 50% respiratory variability, suggesting right atrial pressure of 3 mmHg.   Neuro/Psych Glaucoma  Neuromuscular disease negative psych ROS   GI/Hepatic Neg liver ROS, GERD  Medicated,Duodenal carcinoid   Endo/Other  Hypothyroidism Hyperlipidemia  Renal/GU Renal InsufficiencyRenal disease  negative genitourinary   Musculoskeletal  (+) Arthritis , Osteoarthritis,    Abdominal   Peds  Hematology  (+) Blood dyscrasia, anemia ,   Anesthesia Other Findings   Reproductive/Obstetrics                            Anesthesia Physical Anesthesia Plan  ASA: 3  Anesthesia Plan: MAC   Post-op Pain Management: Minimal or no pain anticipated   Induction: Intravenous  PONV Risk Score and Plan: Treatment may vary due to age or medical condition and Propofol infusion  Airway Management Planned: Natural Airway  Additional Equipment:   Intra-op Plan:   Post-operative Plan:   Informed Consent: I have reviewed the patients History and Physical, chart, labs and discussed the procedure including the risks, benefits and alternatives for the proposed anesthesia with the patient or authorized representative who has indicated his/her understanding and acceptance.     Dental advisory given  Plan Discussed with: CRNA and Anesthesiologist  Anesthesia Plan Comments:        Anesthesia Quick Evaluation

## 2021-08-17 NOTE — Transfer of Care (Signed)
Immediate Anesthesia Transfer of Care Note  Patient: [name] [name]  Procedure(s) Performed: ESOPHAGOGASTRODUODENOSCOPY (EGD) WITH PROPOFOL UPPER ESOPHAGEAL ENDOSCOPIC ULTRASOUND (EUS)  Patient Location: PACU  Anesthesia Type:MAC  Level of Consciousness: oriented, drowsy and patient cooperative  Airway & Oxygen Therapy: Patient Spontanous Breathing and Patient connected to face mask oxygen  Post-op Assessment: Report given to RN and Post -op Vital signs reviewed and stable  Post vital signs: Reviewed  Last Vitals:  Vitals Value Taken Time  BP 138/59 08/17/21 1022  Temp    Pulse 49 08/17/21 1029  Resp 14 08/17/21 1029  SpO2 97 % 08/17/21 1029  Vitals shown include unvalidated device data.  Last Pain:  Vitals:   08/17/21 0710  TempSrc: Temporal  PainSc: 0-No pain         Complications: No notable events documented.

## 2021-08-17 NOTE — H&P (Signed)
GASTROENTEROLOGY PROCEDURE H&P NOTE   Primary Care Physician: Fayrene Helper, MD  HPI: [name] [name] is a 81 y.o. 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  Past Medical History:  Diagnosis Date   CAD (coronary artery disease)    Multivessel status post CABG October 2005   Carotid artery disease ([place])    CKD (chronic kidney disease), stage III ([place])    Essential hypertension    Glaucoma    Laser surgery 2010   Hyperlipidemia    Hypothyroidism    Iron deficiency anemia    Obesity    Past Surgical History:  Procedure Laterality Date   BIOPSY  04/27/2021   Procedure: BIOPSY;  Surgeon: Milus Banister, MD;  Location: [facility];  Service: Endoscopy;;   COLONOSCOPY N/A 02/04/2013   Procedure: COLONOSCOPY;  Surgeon: Rogene Houston, MD;  Location: [facility];  Service: Endoscopy;  Laterality: N/A;  1015   COLONOSCOPY N/A 12/21/2016   Procedure: COLONOSCOPY;  Surgeon: Rogene Houston, MD;  [facility];  Service: Endoscopy;  Laterality: N/A;   COLONOSCOPY WITH PROPOFOL N/A 03/22/2021   Procedure: COLONOSCOPY WITH PROPOFOL;  Surgeon: Rogene Houston, MD;  [facility];  Service: Endoscopy;  Laterality: N/A;  8:15   CORONARY ARTERY BYPASS GRAFT  October 2005   Dr. Roxy Manns - LIMA to LAD, SVG to OM 2 and OM 3, SVG to PDA   ESOPHAGOGASTRODUODENOSCOPY N/A 12/21/2016   Procedure: ESOPHAGOGASTRODUODENOSCOPY (EGD);  Surgeon: Rogene Houston, MD;  [facility];  Service: Endoscopy;  Laterality: N/A;  11:10   ESOPHAGOGASTRODUODENOSCOPY N/A 02/27/2018   Procedure: ESOPHAGOGASTRODUODENOSCOPY (EGD);  Surgeon: Rogene Houston, MD;  [facility];  Service: Endoscopy;  Laterality: N/A;   ESOPHAGOGASTRODUODENOSCOPY (EGD) WITH PROPOFOL N/A 04/27/2021   Procedure: ESOPHAGOGASTRODUODENOSCOPY (EGD) WITH PROPOFOL;  Surgeon: Milus Banister, MD;  Location: [facility];  Service: Endoscopy;  Laterality: N/A;   EUS N/A  04/27/2021   Procedure: UPPER ENDOSCOPIC ULTRASOUND (EUS) RADIAL;  Surgeon: Milus Banister, MD;  Location: [facility];  Service: Endoscopy;  Laterality: N/A;   EYE SURGERY  2010   laser treatment to both eyes for glaucoma   GIVENS CAPSULE STUDY N/A 03/07/2018   Procedure: GIVENS CAPSULE STUDY;  Surgeon: Rogene Houston, MD;  [facility];  Service: Endoscopy;  Laterality: N/A;   GIVENS CAPSULE STUDY N/A 02/05/2020   Procedure: GIVENS CAPSULE STUDY;  Surgeon: Rogene Houston, MD;  [facility];  Service: Endoscopy;  Laterality: N/A;  730   POLYPECTOMY  03/22/2021   Procedure: POLYPECTOMY;  Surgeon: Rogene Houston, MD;  [facility];  Service: Endoscopy;;   Current Facility-Administered Medications  Medication Dose Route Frequency Provider Last Rate Last Admin   0.9 %  sodium chloride infusion   Intravenous Continuous Mansouraty, Telford Nab., MD       lactated ringers infusion   Intravenous Once Mansouraty, Telford Nab., MD        Current Facility-Administered Medications:    0.9 %  sodium chloride infusion, , Intravenous, Continuous, Mansouraty, Telford Nab., MD   lactated ringers infusion, , Intravenous, Once, Mansouraty, Telford Nab., MD Allergies  Allergen Reactions   Tramadol Itching   Lisinopril Cough   Shrimp (Diagnostic)     Gout flares   Clonidine Derivatives Other (See Comments)    Zombie BP dropped   Family History  Problem Relation Age of Onset  Aneurysm Mother    Hypertension Mother    Coronary artery disease Mother    Coronary artery disease Father    Diabetes Sister    Coronary artery disease Sister    Diverticulosis Sister    COPD Sister    Kidney disease Sister    Diabetes Sister    Hypertension Sister    Breast cancer Sister 45   Hearing loss Sister    Heart Problems Sister        pacemaker   Colon cancer Brother 36       lived 69 months afterwards   Liver cancer Brother 7   Diabetes Son    Pancreatic cancer Son     Esophageal cancer Neg Hx    Inflammatory bowel disease Neg Hx    Stomach cancer Neg Hx    Social History   Socioeconomic History   Marital status: Married    Spouse name: Not on file   Number of children: 1   Years of education: Not on file   Highest education level: Not on file  Occupational History   Occupation: retired   Tobacco Use   Smoking status: Former    Packs/day: 0.25    Years: 40.00    Pack years: 10.00    Types: Cigarettes    Quit date: 07/03/2003    Years since quitting: 18.1   Smokeless tobacco: Never  Vaping Use   Vaping Use: Never used  Substance and Sexual Activity   Alcohol use: No    Alcohol/week: 0.0 standard drinks   Drug use: No   Sexual activity: Never  Other Topics Concern   Not on file  Social History Narrative   Not on file   Social Determinants of Health   Financial Resource Strain: Low Risk    Difficulty of Paying Living Expenses: Not hard at all  Food Insecurity: No Food Insecurity   Worried About Charity fundraiser in the Last Year: Never true   [place] in the Last Year: Never true  Transportation Needs: No Transportation Needs   Lack of Transportation (Medical): No   Lack of Transportation (Non-Medical): No  Physical Activity: Insufficiently Active   Days of Exercise per Week: 7 days   Minutes of Exercise per Session: 10 min  Stress: No Stress Concern Present   Feeling of Stress : Not at all  Social Connections: Moderately Integrated   Frequency of Communication with Friends and Family: More than three times a week   Frequency of Social Gatherings with Friends and Family: More than three times a week   Attends Religious Services: Never   Marine scientist or Organizations: Yes   Attends Archivist Meetings: Never   Marital Status: Married  Human resources officer Violence: Not At Risk   Fear of Current or Ex-Partner: No   Emotionally Abused: No   Physically Abused: No   Sexually Abused: No    Physical  Exam: Today's Vitals   08/17/21 0710  BP: (!) 180/56  Pulse: (!) 59  Resp: 14  Temp: (!) 97.5 F (36.4 C)  TempSrc: Temporal  SpO2: 97%  Weight: 68.9 kg  Height: 5\' 1"  (1.549 m)  PainSc: 0-No pain   Body mass index is 28.72 kg/m. GEN: NAD EYE: Sclerae anicteric ENT: MMM CV: Non-tachycardic GI: Soft, NT/ND NEURO:  Alert & Oriented x 3  Lab Results: Recent Labs    08/15/21 1026 08/15/21 1032  WBC 4.3  --   HGB 8.8*  9.0*  HCT 27.9*  --   PLT 194  --    BMET Recent Labs    08/15/21 1026  NA 139  K 4.5  CL 109  CO2 19*  GLUCOSE 80  BUN 31*  CREATININE 1.47*  CALCIUM 10.1   LFT Recent Labs    08/15/21 1026  ALBUMIN 4.0   PT/INR No results for input(s): LABPROT, INR in the last 72 hours.   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  The risks of an EUS including intestinal perforation, bleeding, infection, aspiration, and medication effects were discussed as was the possibility it may not give a definitive diagnosis if a biopsy is performed.  When a biopsy of the pancreas is done as part of the EUS, there is an additional risk of pancreatitis at the rate of about 1-2%.  It was explained that procedure related pancreatitis is typically mild, although it can be severe and even life threatening, which is why we do not perform random pancreatic biopsies and only biopsy a lesion/area we feel is concerning enough to warrant the risk.  The risks and benefits of endoscopic evaluation/treatment were discussed with the patient and/or family; these include but are not limited to the risk of perforation, infection, bleeding, missed lesions, lack of diagnosis, severe illness requiring hospitalization, as well as anesthesia and sedation related illnesses.  The patient's history has been reviewed, patient examined, no change in status, and deemed stable for procedure.  The patient and/or family is agreeable to  proceed.    Justice Britain, MD [facility] Office # [phone]

## 2021-08-17 NOTE — Anesthesia Postprocedure Evaluation (Signed)
Anesthesia Post Note  Patient: [name] [name]  Procedure(s) Performed: ESOPHAGOGASTRODUODENOSCOPY (EGD) WITH PROPOFOL UPPER ESOPHAGEAL ENDOSCOPIC ULTRASOUND (EUS)     Patient location during evaluation: PACU Anesthesia Type: MAC Level of consciousness: awake and alert and oriented Pain management: pain level controlled Vital Signs Assessment: post-procedure vital signs reviewed and stable Respiratory status: spontaneous breathing, nonlabored ventilation, respiratory function stable and patient connected to nasal cannula oxygen Cardiovascular status: stable and blood pressure returned to baseline Postop Assessment: no apparent nausea or vomiting Anesthetic complications: no   No notable events documented.  Last Vitals:  Vitals:   08/17/21 1025 08/17/21 1040  BP: (!) 138/59 (!) 165/58  Pulse: (!) 51 (!) 52  Resp: (!) 21 14  Temp: 36.6 C 36.7 C  SpO2: 100% 97%    Last Pain:  Vitals:   08/17/21 1025  TempSrc:   PainSc: 0-No pain                 Mariska Daffin A.

## 2021-08-17 NOTE — Op Note (Signed)
[facility] Patient Name: [name] [name] Procedure Date : 08/17/2021 MRN: [id number] Attending MD: Justice Britain , MD Date of Birth: Apr 01, 1941 CSN: [id number] Age: 81 Admit Type: Outpatient Procedure:                Upper EUS Indications:              Submucosal tumor versus extrinsic mass found on                            endoscopy, Neuroendocrine Tumor Providers:                Justice Britain, MD, Burtis Junes, RN, Cletis Athens,                            Technician Referring MD:             Hildred Laser, MD, Milan Moshe Cipro MD, MD Medicines:                Monitored Anesthesia Care Complications:            No immediate complications. Estimated Blood Loss:     Estimated blood loss was minimal. Procedure:                Pre-Anesthesia Assessment:                           - Prior to the procedure, a History and Physical                            was performed, and patient medications and                            allergies were reviewed. The patient's tolerance of                            previous anesthesia was also reviewed. The risks                            and benefits of the procedure and the sedation                            options and risks were discussed with the patient.                            All questions were answered, and informed consent                            was obtained. Prior Anticoagulants: The patient has                            taken no previous anticoagulant or antiplatelet                            agents except for aspirin. ASA Grade Assessment:  III - A patient with severe systemic disease. After                            reviewing the risks and benefits, the patient was                            deemed in satisfactory condition to undergo the                            procedure.                           After obtaining informed consent, the endoscope was                             passed under direct vision. Throughout the                            procedure, the patient's blood pressure, pulse, and                            oxygen saturations were monitored continuously. The                            [id number] ([id number]) Olympus therapeutic                            gastroscope was introduced through the mouth, and                            advanced to the second part of duodenum. The                            [id number]-[id number] ([id number]) Olympus radial ultrasound                            scope was introduced through the mouth, and                            advanced to the duodenum for ultrasound examination                            from the stomach and duodenum. The [id number]                            ([id number]) Olympus linear ultrasound scope was                            introduced through the mouth, and advanced to the                            duodenum for ultrasound examination. The upper EUS  was technically difficult and complex due to                            inadequate patient positioning. Successful                            completion of the procedure was aided by changing                            the patient's position, changing endoscopes and                            using scope torsion. The patient tolerated the                            procedure. Scope In: Scope Out: Findings:      ENDOSCOPIC FINDING: :      No gross lesions were noted in the entire esophagus.      The Z-line was irregular and was found 40 cm from the incisors.      A J-shaped deformity was found of the stomach.      No gross mucosal lesions were noted in the entire examined stomach.      Diffuse moderate mucosal changes characterized by       melanosis/hemosiderosis were found in the entire duodenum.      A single 12 mm submucosal nodule was found in the second portion of the       duodenum - just past the D1/D2 sweep. The tissue looks  different than       prior (likely from previous biopsies). After the EUS was completed, I       did place a banding cap in an attempt to see how the lesion itself would       potentially enter into a suction banding cap. The lesion was able to be       suctioned but I could not tell that the deeper MP was not present within       the tissue.      After some tension with passing the endoscopic banding cap through the       UES, upon removal of the scope a non-bleeding superficial mucosal wrent       as a result of scope passage was found in the proximal esophagus/UES       region.      ENDOSONOGRAPHIC FINDING: :      Views of the duodenum were very difficult to obtain with both radial and       linear EUS today due to the location of the lesion. Using water       immersion without the balloon, on the radial scope, a rounded intramural       (subepithelial) lesion was found in the second portion of the duodenum.       The lesion was hypoechoic. Endosonographically, the lesion appeared to       originate from within the deep mucosa (Layer 2) and submucosa (Layer 3).       The lesion measured 13 mm (in maximum thickness). The outer margins were       irregular. On multiple pass throughs, I had concern about the       possibility of this  lesion significantly abutting through the submucosa       to potentially the muscularis propria (Layer 4) though overt disruption       was not felt to be present. However, as I noted above, my EUS scope       imaging was not what I would want to truly allow me to say that there       couldn't be more invasiveness today (4 months since her last procedure).      No malignant-appearing lymph nodes were visualized in the celiac region       (level 20), peripancreatic region and porta hepatis region.      The celiac region was visualized. Impression:               EGD Impression:                           - No gross lesions in esophagus. Z-line irregular,                             40 cm from the incisors.                           - J-shaped deformity in the entire stomach. But no                            gross mucosal lesions in the stomach.                           - Melanosis/hemosiderosis of the visualized                            duodenum.                           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                            See above for attempt at resection.                           - After having some tension with passing the capped                            therapeutic endoscope, I noted an incidental                            superficial mucosal wrent at the UES/proximal                            esophagus.                           EUS Impression:                           -  An intramural (subepithelial) lesion was found in                            the second portion of the duodenum. The lesion                            appeared to originate from within the deep mucosa                            (Layer 2) and submucosa (Layer 3). As noted above                            there was what appeared to be lesion moving very                            close with near abutment fo the MP (Layer 4) though                            overt invasion was note seen. A tissue diagnosis                            was obtained prior to this exam. This is consistent                            with a neuroendocrine tumor. I did not feelt an EMR                            resection today was safe based on my imaging/views,                            though it could still be possible or could require                            ESD attempt or FTRD.                           - No malignant-appearing lymph nodes were                            visualized in the celiac region (level 20),                            peripancreatic region and porta hepatis  region. Recommendation:           - The patient will be observed post-procedure,                            until all discharge criteria are met.                           - Discharge patient to home.                           -  Patient has a contact number available for                            emergencies. The signs and symptoms of potential                            delayed complications were discussed with the                            patient. Return to normal activities tomorrow.                            Written discharge instructions were provided to the                            patient.                           - Resume previous diet.                           - Please use Cepacol or Halls Lozenges +/-                            Chloraseptic spray for next 72-96 hours to aid in                            sore thoat should you experience this.                           - Observe patient's clinical course.                           - Will discuss with patient/family. With the views                            that were obtained on today's EUS and with                            positioning issues, it was not felt that this would                            be amenable to endoscopic resection. If another                            operator and better positioning and views on EUS                            can be obtained, there may be chance for repeat                            attempt at EMR vs attempt at ESD of this particular  lesion. I do not perform FTRD of the Upper GI                            tract, and with the mucosal wrent noted with                            passage of the endoscopic cap, I would have concern                            if UES passage of the FTRD upper device would be                            possible, but something to consider. She will need                            to be referred to a quaternary center if this is                             what they would like (I presume they will since we                            had brought her up to this point in time).                           - The findings and recommendations were discussed                            with the patient.                           - The findings and recommendations were discussed                            with the designated responsible adult. Procedure Code(s):        --- Professional ---                           [phone], Esophagogastroduodenoscopy, flexible,                            transoral; with endoscopic ultrasound examination                            limited to the esophagus, stomach or duodenum, and                            adjacent structures Diagnosis Code(s):        --- Professional ---                           K22.8, Other specified diseases of esophagus                           K31.89,  Other diseases of stomach and duodenum                           S27.818A, Other injury of esophagus (thoracic                            part), initial encounter                           I89.9, Noninfective disorder of lymphatic vessels                            and lymph nodes, unspecified                           K92.9, Disease of digestive system, unspecified CPT copyright 2019 [employer]. All rights reserved. The codes documented in this report are preliminary and upon coder review may  be revised to meet current compliance requirements. Justice Britain, MD 08/17/2021 10:38:57 AM Number of Addenda: 0

## 2021-08-21 DIAGNOSIS — D631 Anemia in chronic kidney disease: Secondary | ICD-10-CM | POA: Diagnosis not present

## 2021-08-21 DIAGNOSIS — I5032 Chronic diastolic (congestive) heart failure: Secondary | ICD-10-CM | POA: Diagnosis not present

## 2021-08-21 DIAGNOSIS — N189 Chronic kidney disease, unspecified: Secondary | ICD-10-CM | POA: Diagnosis not present

## 2021-08-21 DIAGNOSIS — N1832 Chronic kidney disease, stage 3b: Secondary | ICD-10-CM | POA: Diagnosis not present

## 2021-08-21 DIAGNOSIS — R809 Proteinuria, unspecified: Secondary | ICD-10-CM | POA: Diagnosis not present

## 2021-08-21 DIAGNOSIS — I129 Hypertensive chronic kidney disease with stage 1 through stage 4 chronic kidney disease, or unspecified chronic kidney disease: Secondary | ICD-10-CM | POA: Diagnosis not present

## 2021-08-29 ENCOUNTER — Encounter (HOSPITAL_COMMUNITY): Payer: Self-pay

## 2021-08-29 ENCOUNTER — Ambulatory Visit (INDEPENDENT_AMBULATORY_CARE_PROVIDER_SITE_OTHER): Payer: Medicare Other | Admitting: Internal Medicine

## 2021-08-29 ENCOUNTER — Encounter (INDEPENDENT_AMBULATORY_CARE_PROVIDER_SITE_OTHER): Payer: Self-pay | Admitting: Internal Medicine

## 2021-08-29 ENCOUNTER — Other Ambulatory Visit: Payer: Self-pay

## 2021-08-29 ENCOUNTER — Encounter (HOSPITAL_COMMUNITY)
Admission: RE | Admit: 2021-08-29 | Discharge: 2021-08-29 | Disposition: A | Discharge status: Home / Self Care | Payer: Medicare Other | Source: Ambulatory Visit | Attending: Nephrology | Admitting: Nephrology

## 2021-08-29 VITALS — BP 166/67 | HR 56 | Temp 97.9°F | Ht 61.0 in | Wt 156.0 lb

## 2021-08-29 DIAGNOSIS — K219 Gastro-esophageal reflux disease without esophagitis: Secondary | ICD-10-CM | POA: Diagnosis not present

## 2021-08-29 DIAGNOSIS — D3A8 Other benign neuroendocrine tumors: Secondary | ICD-10-CM

## 2021-08-29 DIAGNOSIS — N1832 Chronic kidney disease, stage 3b: Secondary | ICD-10-CM | POA: Diagnosis not present

## 2021-08-29 DIAGNOSIS — D631 Anemia in chronic kidney disease: Secondary | ICD-10-CM | POA: Diagnosis not present

## 2021-08-29 DIAGNOSIS — D509 Iron deficiency anemia, unspecified: Secondary | ICD-10-CM

## 2021-08-29 LAB — POCT HEMOGLOBIN-HEMACUE: Hemoglobin: 10.5 g/dL — ABNORMAL LOW (ref 12.0–15.0)

## 2021-08-29 MED ORDER — EPOETIN ALFA-EPBX 3000 UNIT/ML IJ SOLN: 3000.0000 [IU] | Freq: Once | INTRAMUSCULAR | Status: DC

## 2021-08-29 NOTE — Patient Instructions (Signed)
Notify if he has tarry stools or rectal bleeding. Notify if you have upper abdominal pain.

## 2021-08-29 NOTE — Progress Notes (Signed)
Presenting complaint;  History of iron deficiency anemia, GERD and duodenal neuroendocrine tumor.  Database and subjective:  Patient is 81 year old African-American female who is here for scheduled visit. She has over 5-year history of iron deficiency anemia secondary to GI bleed.  She has undergone multiple EGDs and colonoscopies since then along with 2 small bowel given capsule studies.  Last EGD and colonoscopy were in September 2022.  EGD revealed no bleeding lesion.  However she was found to have by 10 to 12 mm submucosal lesion in second part of the duodenum.  Colonoscopy revealed 4 small polyps sigmoid diverticulosis and external hemorrhoids.  3 polyps are tubular adenomas and the fourth polyp was sessile serrated polyp. Patient was referred to Dr. Oretha Caprice for endoscopic ultrasound.  This study was performed on 04/27/2021.  He measured this lesion to be 8.4 mm.  It was in submucosa and deep muscle layer.  Biopsy revealed well-differentiated neuroendocrine tumor/G1.  It revealed low proliferative index of 1.  PET scan was negative. Patient was scheduled with endoscopic resection by Dr. Rush Landmark on 08/17/2021.  He reevaluate the lesion endoscopically and felt it was too deep and risk was high.  Therefore did not proceed with resection.  He recommended repeat exam in few weeks or referral to tertiary center. Patient states her experience was painful as she was not deeply sedated when the procedure was performed. She is not sure where she wants to go from here. She says she is not have any symptoms.  She denies nausea vomiting abdominal pain melena or rectal bleeding.  She denies history of skin rash or wheezing.  She says her appetite is good.  She does have days when she feels tired. She is going to the lab to have blood work later today.   Current Medications: Outpatient Encounter Medications as of 08/29/2021  Medication Sig   amLODipine (NORVASC) 5 MG tablet Take one tablet by mouth  twice daily for blood pressure   aspirin 81 MG tablet Take 1 tablet (81 mg total) by mouth daily. Restart on 03/02/18   cetirizine (ZYRTEC) 10 MG tablet TAKE ONE TABLET BY MOUTH DAILY   ferrous sulfate 325 (65 FE) MG tablet Take 325 mg by mouth daily with breakfast.   fish oil-omega-3 fatty acids 1000 MG capsule Take 1 g by mouth daily.   fluticasone (FLONASE) 50 MCG/ACT nasal spray USE 2 SPRAYS IN EACH NOSTRIL ONCE DAILY. SHAKE GENTLY BEFORE USING. (Patient taking differently: Place 1 spray into both nostrils daily as needed for allergies.)   hydrALAZINE (APRESOLINE) 100 MG tablet Take 100 mg by mouth 2 (two) times daily.   latanoprost (XALATAN) 0.005 % ophthalmic solution Place 1 drop into both eyes at bedtime.   levothyroxine (SYNTHROID) 100 MCG tablet Take 1 tablet (100 mcg total) by mouth daily.   metoprolol succinate (TOPROL-XL) 100 MG 24 hr tablet TAKE ONE TABLET BY MOUTH DAILY. TAKE WITH OR IMMEDIATELY FOLLOWING A MEAL.   Misc. Devices (WALL GRAB BAR) MISC Grab bar for shower x 2 DX high fall risk   nitroGLYCERIN (NITROSTAT) 0.4 MG SL tablet DISSOLVE ONE TABLET UNDER TONGUE EVERY 5 MINUTES UP TO 3 DOSES AS NEEDED FOR CHEST PAIN   olmesartan (BENICAR) 20 MG tablet Take 20 mg by mouth 2 (two) times daily.   omeprazole (PRILOSEC) 20 MG capsule Take 1 capsule (20 mg total) by mouth daily.   Polyvinyl Alcohol-Povidone (REFRESH OP) Place 1 drop into both eyes at bedtime.   rosuvastatin (CRESTOR) 10 MG tablet  TAKE ONE TABLET BY MOUTH DAILY.   SYSTANE ULTRA 0.4-0.3 % SOLN Place 1 drop into both eyes daily as needed (dry eyes).   trolamine salicylate (ASPERCREME) 10 % cream Apply 1 application topically as needed for muscle pain.   UNABLE TO FIND Shower chair x 1  DX M54.36   levothyroxine (SYNTHROID) 88 MCG tablet Take 132 mcg by mouth daily before breakfast. (Patient not taking: Reported on 08/29/2021)   No facility-administered encounter medications on file as of 08/29/2021.      Objective: Blood pressure (!) 166/67, pulse (!) 56, temperature 97.9 F (36.6 C), temperature source Oral, height _0  (1.549 m), weight 156 lb (70.8 kg). Patient is alert and in no acute distress. Conjunctiva is pink. Sclera is nonicteric Oropharyngeal mucosa is normal. No neck masses or thyromegaly noted. Cardiac exam with regular rhythm normal S1 and S2.  Grade 2/6 systolic murmur best heard at aortic area. Lungs are clear to auscultation. Abdomen is full but soft and nontender with organomegaly or masses. No LE edema or clubbing noted.  Labs/studies Results:   CBC Latest Ref Rng & Units 08/15/2021 08/15/2021 07/27/2021  WBC 4.0 - 10.5 K/uL - 4.3 -  Hemoglobin 12.0 - 15.0 g/dL 9.0(L) 8.8(L) 8.6(L)  Hematocrit 36.0 - 46.0 % - 27.9(L) -  Platelets 150 - 400 K/uL - 194 -    CMP Latest Ref Rng & Units 08/15/2021 06/29/2021 06/15/2021  Glucose 70 - 99 mg/dL 80 84 -  BUN 8 - 23 mg/dL 31(H) 35(H) -  Creatinine 0.44 - 1.00 mg/dL 1.47(H) 1.48(H) -  Sodium 135 - 145 mmol/L 139 136 -  Potassium 3.5 - 5.1 mmol/L 4.5 4.2 -  Chloride 98 - 111 mmol/L 109 106 -  CO2 22 - 32 mmol/L 19(L) 21(L) -  Calcium 8.9 - 10.3 mg/dL 10.1 10.0 -  Total Protein 6.5 - 8.1 g/dL - - 7.7  Total Bilirubin 0.3 - 1.2 mg/dL - - 0.2(L)  Alkaline Phos 38 - 126 U/L - - 89  AST 15 - 41 U/L - - 24  ALT 0 - 44 U/L - - 15    Hepatic Function Latest Ref Rng & Units 08/15/2021 06/15/2021 03/01/2021  Total Protein 6.5 - 8.1 g/dL - 7.7 -  Albumin 3.5 - 5.0 g/dL 4.0 3.8 3.7  AST 15 - 41 U/L - 24 -  ALT 0 - 44 U/L - 15 -  Alk Phosphatase 38 - 126 U/L - 89 -  Total Bilirubin 0.3 - 1.2 mg/dL - 0.2(L) -  Bilirubin, Direct 0.0 - 0.2 mg/dL - 0.1 -     Assessment:  #1.  Iron deficiency anemia.  Her hemoglobin has remained stable.  She is due for H&H tomorrow via oncology.  She is not having any overt GI bleed.  She had EGD and colonoscopy in September 2022.  She was found to have submucosal lesion in duodenum which has  been further evaluated as discussed below. She has had given capsule study in September 2019 and again in August 2021.  If she has another episode of overt GI bleed to the point that she needs blood transfusion she may be a candidate for single or double-balloon enteroscopy.  #2.  Chronic GERD.  Symptoms well controlled with PPI.  #3.  Neuroendocrine tumor involving duodenum.  This was picked up on EGD of September 2022.  She had EUS with FNA by Dr. Oretha Caprice in October 2022.  She underwent another exam by Dr. Rush Landmark about 2 weeks  ago.  He felt at risk of endoscopic removal was high.  Therefore did not proceed with excision. Patient is not sure where she wants to go from here. We discussed referral to [facility] or [facility].  She wants to wait.  Plan:  Patient will call if she has melena or rectal bleeding. Patient advised to call office if she has upper abdominal pain. Patient will return in 2 months to determine neck step in management of duodenal neuroendocrine tumor.

## 2021-08-29 NOTE — Progress Notes (Signed)
Latest Reference Range & Units 08/29/21 12:13  Hemoglobin 12.0 - 15.0 g/dL 10.5 (L)  (L): Data is abnormally low

## 2021-09-05 ENCOUNTER — Ambulatory Visit (INDEPENDENT_AMBULATORY_CARE_PROVIDER_SITE_OTHER): Payer: Medicare Other | Admitting: *Deleted

## 2021-09-05 DIAGNOSIS — I10 Essential (primary) hypertension: Secondary | ICD-10-CM

## 2021-09-05 DIAGNOSIS — I2510 Atherosclerotic heart disease of native coronary artery without angina pectoris: Secondary | ICD-10-CM

## 2021-09-05 NOTE — Chronic Care Management (AMB) (Signed)
Chronic Care Management   CCM RN Visit Note  09/05/2021 Name: [name] [name] MRN: [id number] DOB: Feb 26, 1941  Subjective: [name] [name] is a 81 y.o. year old female who is a primary care patient of Fayrene Helper, MD. The care management team was consulted for assistance with disease management and care coordination needs.    Engaged with patient by telephone for follow up visit in response to provider referral for case management and/or care coordination services.   Consent to Services:  The patient was given information about Chronic Care Management services, agreed to services, and gave verbal consent prior to initiation of services.  Please see initial visit note for detailed documentation.   Patient agreed to services and verbal consent obtained.   Assessment: Review of patient past medical history, allergies, medications, health status, including review of consultants reports, laboratory and other test data, was performed as part of comprehensive evaluation and provision of chronic care management services.   SDOH (Social Determinants of Health) assessments and interventions performed:    CCM Care Plan  Allergies  Allergen Reactions   Tramadol Itching   Lisinopril Cough   Shrimp (Diagnostic)     Gout flares   Clonidine Derivatives Other (See Comments)    Zombie BP dropped    Outpatient Encounter Medications as of 09/05/2021  Medication Sig Note   amLODipine (NORVASC) 5 MG tablet Take one tablet by mouth twice daily for blood pressure    aspirin 81 MG tablet Take 1 tablet (81 mg total) by mouth daily. Restart on 03/02/18    cetirizine (ZYRTEC) 10 MG tablet TAKE ONE TABLET BY MOUTH DAILY    ferrous sulfate 325 (65 FE) MG tablet Take 325 mg by mouth daily with breakfast.    fish oil-omega-3 fatty acids 1000 MG capsule Take 1 g by mouth daily.    fluticasone (FLONASE) 50 MCG/ACT nasal spray USE 2 SPRAYS IN EACH NOSTRIL ONCE DAILY. SHAKE GENTLY BEFORE USING. (Patient  taking differently: Place 1 spray into both nostrils daily as needed for allergies.)    hydrALAZINE (APRESOLINE) 100 MG tablet Take 100 mg by mouth 2 (two) times daily.    latanoprost (XALATAN) 0.005 % ophthalmic solution Place 1 drop into both eyes at bedtime.    levothyroxine (SYNTHROID) 100 MCG tablet Take 1 tablet (100 mcg total) by mouth daily. 08/11/2021: Pt states she was instructed to finish taking the 132 mcg (88 mcg 1.5 tab) dose then start the 100 mcg daily    metoprolol succinate (TOPROL-XL) 100 MG 24 hr tablet TAKE ONE TABLET BY MOUTH DAILY. TAKE WITH OR IMMEDIATELY FOLLOWING A MEAL.    Misc. Devices (WALL GRAB BAR) MISC Grab bar for shower x 2 DX high fall risk    nitroGLYCERIN (NITROSTAT) 0.4 MG SL tablet DISSOLVE ONE TABLET UNDER TONGUE EVERY 5 MINUTES UP TO 3 DOSES AS NEEDED FOR CHEST PAIN    olmesartan (BENICAR) 20 MG tablet Take 20 mg by mouth 2 (two) times daily.    omeprazole (PRILOSEC) 20 MG capsule Take 1 capsule (20 mg total) by mouth daily.    Polyvinyl Alcohol-Povidone (REFRESH OP) Place 1 drop into both eyes at bedtime.    rosuvastatin (CRESTOR) 10 MG tablet TAKE ONE TABLET BY MOUTH DAILY.    SYSTANE ULTRA 0.4-0.3 % SOLN Place 1 drop into both eyes daily as needed (dry eyes).    trolamine salicylate (ASPERCREME) 10 % cream Apply 1 application topically as needed for muscle pain.    UNABLE TO FIND  Shower chair x 1  DX M54.36    No facility-administered encounter medications on file as of 09/05/2021.    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   Neuroendocrine tumor 06/10/2021   Abnormal findings on esophagogastroduodenoscopy (EGD) 06/10/2021   Iron deficiency anemia 06/10/2021   Transportation insecurity due to excessive travel time to destination 05/13/2021   Carpal tunnel syndrome, right upper limb 11/02/2020   Numbness of fingers 10/26/2020   Gout 08/04/2018   Hyperuricemia 08/04/2018    Osteoarthritis of right knee 03/04/2018   CKD (chronic kidney disease) stage 3, GFR 30-59 ml/min (HCC) 02/26/2018   Carotid stenosis 02/01/2018   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   GERD (gastroesophageal reflux disease) 11/10/2013   Bradycardia, sinus 95/28/4132   Metabolic syndrome X 44/07/270   CAD (coronary artery disease)    Ejection fraction    CKD (chronic kidney disease) 07/11/2011   Hypothyroidism 01/31/2009   CORONARY ARTERY BYPASS GRAFT, HX OF 01/31/2009   Carotid artery disease ([place]) 11/30/2008   Allergic rhinitis 08/12/2008   Hyperlipidemia LDL goal <70 10/07/2007   Obesity (BMI 30.0-34.9) 10/07/2007   Essential hypertension, malignant 10/07/2007    Conditions to be addressed/monitored:CAD and HTN  Care Plan : RN Care Manager Plan of Care  Updates made by Kassie Mends, RN since 09/05/2021 12:00 AM     Problem: No plan of care established for management of chronic disease states  (HTN, CAD)   Priority: High     Long-Range Goal: Development of plan of care for chronic disease management  (HTN, CAD)   Start Date: 06/22/2021  Expected End Date: 12/19/2021  Priority: High  Note:   Current Barriers:  Knowledge Deficits related to plan of care for management of CAD and HTN  Ineffective Self Health Maintenance in a patient with CAD- patient reports she lives with spouse, is overall independent with ADL, IADL's, continues to drive. Reports received flu vaccine. Patient reports she does not always eat heart healthy diet but she does try as much as possible to eat healthy. Knowledge Deficits related to basic understanding of hypertension pathophysiology and self care management- pt reports she has blood pressure cuff and checks blood pressure weekly at least, reports she did receive flu vaccine.  Reports receives retacrit injection for iron deficiency anemia related to chronic blood loss and continues q 2 weeks, pt reports she had PET scan on  11/18 and "shows no cancer" and had endoscopy on 2/16 to  "have lesions removed out of my stomach" Pt reports she did have the endoscopy and states " they couldn't find the polyp"  Pt reports they may want her to come back at a later time to have endoscopy repeated.  Does not adhere to provider recommendations re: does not always eat low sodium diet RNCM Clinical Goal(s):  Patient will verbalize understanding of plan for management of CAD and HTN as evidenced by pt report, review of EHR and  through collaboration with RN Care manager, provider, and care team.   Interventions: 1:1 collaboration with primary care provider regarding development and update of comprehensive plan of care as evidenced by provider attestation and co-signature Inter-disciplinary care team collaboration (see longitudinal plan of care) Evaluation of current treatment plan related to  self management and patient's adherence to plan as established by provider  CAD Interventions: (Status:  Goal on track:  Yes.) Long Term Goal Assessed understanding of CAD diagnosis Medications reviewed  including medications utilized in CAD treatment plan Provided education on importance of blood pressure control in management of CAD Counseled on importance of regular laboratory monitoring as prescribed Reviewed Importance of taking all medications as prescribed Reviewed Importance of attending all scheduled provider appointments Reviewed heart healthy diet Reviewed good handwashing, wearing mask as needed  Hypertension Interventions:  (Status:  Goal on track:  Yes.) Long Term Goal Last practice recorded BP readings:  BP Readings from Last 3 Encounters:  06/15/21 (!) 167/70  06/09/21 (!) 172/68  06/01/21 (!) 171/59  Most recent eGFR/CrCl:  Lab Results  Component Value Date   EGFR 39 (L) 05/10/2021    No components found for: CRCL  Evaluation of current treatment plan related to hypertension self management and patient's adherence to  plan as established by provider Reviewed medications with patient and discussed importance of compliance Advised patient, providing education and rationale, to monitor blood pressure daily and record, calling PCP for findings outside established parameters  Reviewed low sodium diet Reviewed all upcoming scheduled appointments Pain assessment completed  Patient Goals/Self-Care Activities: Take medications as prescribed   Attend all scheduled provider appointments Perform all self care activities independently  Call provider office for new concerns or questions  check blood pressure 3 times per week take blood pressure log to all doctor appointments keep all doctor appointments take medications for blood pressure exactly as prescribed report new symptoms to your doctor Practice good handwashing, wear a mask as needed and avoid sick people Eat heart healthy diet, avoid trans/ saturated fats- bake or broil foods instead of frying Follow up with primary care provider 10/12/21      Plan:Telephone follow up appointment with care management team member scheduled for:  11/21/21  Jacqlyn Larsen [facility], BSN RN Case Manager [facility] [phone]

## 2021-09-05 NOTE — Patient Instructions (Signed)
Visit Information ? ?Thank you for taking time to visit with me today. Please don't hesitate to contact me if I can be of assistance to you before our next scheduled telephone appointment. ? ?Following are the goals we discussed today:  ?Take medications as prescribed   ?Attend all scheduled provider appointments ?Perform all self care activities independently  ?Call provider office for new concerns or questions  ?check blood pressure 3 times per week ?take blood pressure log to all doctor appointments ?keep all doctor appointments ?take medications for blood pressure exactly as prescribed ?report new symptoms to your doctor ?Practice good handwashing, wear a mask as needed and avoid sick people ?Eat heart healthy diet, avoid trans/ saturated fats- bake or broil foods instead of frying ?Follow up with primary care provider 10/12/21 ? ?Our next appointment is by telephone on 11/21/21 at 9 am ? ?Please call the care guide team at [phone] if you need to cancel or reschedule your appointment.  ? ?If you are experiencing a Mental Health or [place] or need someone to talk to, please call the Suicide and Crisis Lifeline: 988 ?call the [place] National Suicide Prevention Lifeline: 307-713-7171 or TTY: 603-748-8308 TTY 315-513-6697) to talk to a trained counselor ?call 1-800-273-TALK (toll free, 24 hour hotline) ?go to [facility] [address], [place] [phone]) ?call the [facility]: [phone] ?call 911  ? ?The patient verbalized understanding of instructions, educational materials, and care plan provided today and declined offer to receive copy of patient instructions, educational materials, and care plan.  ? ? ?Jacqlyn Larsen RNC, BSN ?RN Case Manager ?[place] ?[phone] ? ?

## 2021-09-12 ENCOUNTER — Encounter (HOSPITAL_COMMUNITY)
Admission: RE | Admit: 2021-09-12 | Discharge: 2021-09-12 | Disposition: A | Discharge status: Home / Self Care | Payer: Medicare Other | Source: Ambulatory Visit | Attending: Nephrology | Admitting: Nephrology

## 2021-09-12 ENCOUNTER — Encounter (HOSPITAL_COMMUNITY): Payer: Self-pay

## 2021-09-12 DIAGNOSIS — D631 Anemia in chronic kidney disease: Secondary | ICD-10-CM | POA: Diagnosis not present

## 2021-09-12 DIAGNOSIS — N1832 Chronic kidney disease, stage 3b: Secondary | ICD-10-CM | POA: Diagnosis not present

## 2021-09-12 LAB — POCT HEMOGLOBIN-HEMACUE: Hemoglobin: 9.1 g/dL — ABNORMAL LOW (ref 12.0–15.0)

## 2021-09-12 MED ORDER — EPOETIN ALFA-EPBX 3000 UNIT/ML IJ SOLN: 3000.0000 [IU] | Freq: Once | INTRAMUSCULAR | Status: AC

## 2021-09-12 MED ORDER — EPOETIN ALFA-EPBX 3000 UNIT/ML IJ SOLN
INTRAMUSCULAR | Status: AC
  Administered 2021-09-12: 3000 [IU] via SUBCUTANEOUS
  Filled 2021-09-12: qty 1

## 2021-09-14 ENCOUNTER — Other Ambulatory Visit (INDEPENDENT_AMBULATORY_CARE_PROVIDER_SITE_OTHER): Payer: Self-pay | Admitting: Gastroenterology

## 2021-09-20 ENCOUNTER — Telehealth: Payer: Self-pay

## 2021-09-20 NOTE — Telephone Encounter (Signed)
-----   Message from Irving Copas., MD sent at 09/20/2021 12:34 PM EDT ----- ?Regarding: Follow-up with patient ?[name] [name], ?Please reach out to the patient and see if she and her husband have decided if they would like a referral to [facility] or [place] or [place]. 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 ?Put in referral once they have let us know where. ?Thank you. ?GM ? ?

## 2021-09-20 NOTE — Telephone Encounter (Signed)
I spoke with the pt and her husband over the phone.  I asked if they had decided on a referral to [facility], [place] or [place].  They state that she is not interested at this time in pursuing any referrals.  They did say that when/if they wish to proceed they wish to let Dr Laural Golden handle the referral.  They state it was discussed at office visit with Dr Laural Golden at last office visit (per chart was 08/29/21) and they advised Dr Laural Golden at that time they were not ready to proceed.   ?

## 2021-09-26 ENCOUNTER — Encounter (HOSPITAL_COMMUNITY): Payer: Self-pay

## 2021-09-26 ENCOUNTER — Encounter (HOSPITAL_COMMUNITY)
Admission: RE | Admit: 2021-09-26 | Discharge: 2021-09-26 | Disposition: A | Discharge status: Home / Self Care | Payer: Medicare Other | Source: Ambulatory Visit | Attending: Nephrology | Admitting: Nephrology

## 2021-09-26 DIAGNOSIS — D631 Anemia in chronic kidney disease: Secondary | ICD-10-CM | POA: Diagnosis not present

## 2021-09-26 DIAGNOSIS — N1832 Chronic kidney disease, stage 3b: Secondary | ICD-10-CM | POA: Diagnosis not present

## 2021-09-26 LAB — POCT HEMOGLOBIN-HEMACUE: Hemoglobin: 9 g/dL — ABNORMAL LOW (ref 12.0–15.0)

## 2021-09-26 MED ORDER — EPOETIN ALFA-EPBX 3000 UNIT/ML IJ SOLN
3000.0000 [IU] | Freq: Once | INTRAMUSCULAR | Status: AC
  Administered 2021-09-26: 3000 [IU] via SUBCUTANEOUS
  Filled 2021-09-26: qty 1

## 2021-09-28 ENCOUNTER — Ambulatory Visit (INDEPENDENT_AMBULATORY_CARE_PROVIDER_SITE_OTHER): Payer: Medicare Other

## 2021-09-28 DIAGNOSIS — I6523 Occlusion and stenosis of bilateral carotid arteries: Secondary | ICD-10-CM

## 2021-09-29 DIAGNOSIS — I10 Essential (primary) hypertension: Secondary | ICD-10-CM

## 2021-09-29 DIAGNOSIS — I2510 Atherosclerotic heart disease of native coronary artery without angina pectoris: Secondary | ICD-10-CM | POA: Diagnosis not present

## 2021-10-05 DIAGNOSIS — I10 Essential (primary) hypertension: Secondary | ICD-10-CM | POA: Diagnosis not present

## 2021-10-05 DIAGNOSIS — Z13220 Encounter for screening for lipoid disorders: Secondary | ICD-10-CM | POA: Diagnosis not present

## 2021-10-06 LAB — CMP14+EGFR
ALT: 11 IU/L (ref 0–32)
AST: 19 IU/L (ref 0–40)
Albumin/Globulin Ratio: 1.4 (ref 1.2–2.2)
Albumin: 4.1 g/dL (ref 3.7–4.7)
Alkaline Phosphatase: 91 IU/L (ref 44–121)
BUN/Creatinine Ratio: 19 (ref 12–28)
BUN: 32 mg/dL — ABNORMAL HIGH (ref 8–27)
Bilirubin Total: <0.2 mg/dL (ref 0.0–1.2)
CO2: 21 mmol/L (ref 20–29)
Calcium: 10.5 mg/dL — ABNORMAL HIGH (ref 8.7–10.3)
Chloride: 109 mmol/L — ABNORMAL HIGH (ref 96–106)
Creatinine, Ser: 1.69 mg/dL — ABNORMAL HIGH (ref 0.57–1.00)
Globulin, Total: 3 g/dL (ref 1.5–4.5)
Glucose: 89 mg/dL (ref 70–99)
Potassium: 5.3 mmol/L — ABNORMAL HIGH (ref 3.5–5.2)
Sodium: 142 mmol/L (ref 134–144)
Total Protein: 7.1 g/dL (ref 6.0–8.5)
eGFR: 31 mL/min/{1.73_m2} — ABNORMAL LOW (ref >59)

## 2021-10-06 LAB — LIPID PANEL
Chol/HDL Ratio: 2.2 ratio (ref 0.0–4.4)
Cholesterol, Total: 137 mg/dL (ref 100–199)
HDL: 61 mg/dL (ref >39)
LDL Chol Calc (NIH): 61 mg/dL (ref 0–99)
Triglycerides: 77 mg/dL (ref 0–149)
VLDL Cholesterol Cal: 15 mg/dL (ref 5–40)

## 2021-10-06 LAB — TSH: TSH: 0.496 u[IU]/mL (ref 0.450–4.500)

## 2021-10-10 ENCOUNTER — Encounter (HOSPITAL_COMMUNITY)
Admission: RE | Admit: 2021-10-10 | Discharge: 2021-10-10 | Disposition: A | Discharge status: Home / Self Care | Payer: Medicare Other | Source: Ambulatory Visit | Attending: Nephrology | Admitting: Nephrology

## 2021-10-10 DIAGNOSIS — D631 Anemia in chronic kidney disease: Secondary | ICD-10-CM | POA: Insufficient documentation

## 2021-10-10 DIAGNOSIS — N1832 Chronic kidney disease, stage 3b: Secondary | ICD-10-CM | POA: Diagnosis not present

## 2021-10-10 LAB — POCT HEMOGLOBIN-HEMACUE: Hemoglobin: 10.3 g/dL — ABNORMAL LOW (ref 12.0–15.0)

## 2021-10-10 MED ORDER — EPOETIN ALFA-EPBX 3000 UNIT/ML IJ SOLN: 3000.0000 [IU] | Freq: Once | INTRAMUSCULAR | Status: DC

## 2021-10-12 ENCOUNTER — Encounter: Payer: Self-pay | Admitting: Family Medicine

## 2021-10-12 ENCOUNTER — Ambulatory Visit (INDEPENDENT_AMBULATORY_CARE_PROVIDER_SITE_OTHER): Payer: Medicare Other | Admitting: Family Medicine

## 2021-10-12 VITALS — BP 145/63 | HR 64 | Resp 16 | Ht 61.0 in | Wt 154.8 lb

## 2021-10-12 DIAGNOSIS — E669 Obesity, unspecified: Secondary | ICD-10-CM

## 2021-10-12 DIAGNOSIS — E038 Other specified hypothyroidism: Secondary | ICD-10-CM

## 2021-10-12 DIAGNOSIS — E785 Hyperlipidemia, unspecified: Secondary | ICD-10-CM

## 2021-10-12 DIAGNOSIS — L309 Dermatitis, unspecified: Secondary | ICD-10-CM

## 2021-10-12 DIAGNOSIS — I10 Essential (primary) hypertension: Secondary | ICD-10-CM | POA: Diagnosis not present

## 2021-10-12 MED ORDER — HYDROCORTISONE 0.5 % EX CREA: TOPICAL_CREAM | CUTANEOUS | 0 refills | Status: DC

## 2021-10-12 NOTE — Patient Instructions (Addendum)
Nnual exam 11/10 or after, call if you need me sooner ? ?Great labs and blood pressure, no med changes ? ?Please get shingrix vaccines at the pharmacy ? ?Please keep mammogram appt in June ? ?It is important that you exercise regularly at least 30 minutes 5 times a week. If you develop chest pain, have severe difficulty breathing, or feel very tired, stop exercising immediately and seek medical attention  ? ?Thanks for choosing [facility] we consider it a privelige to serve you. ? ? ?Hydrocortisone cream prescribed for rash, looks like may be allergic reaction to tape used ?

## 2021-10-15 ENCOUNTER — Encounter: Payer: Self-pay | Admitting: Family Medicine

## 2021-10-15 DIAGNOSIS — L309 Dermatitis, unspecified: Secondary | ICD-10-CM | POA: Insufficient documentation

## 2021-10-15 NOTE — Assessment & Plan Note (Signed)
Topical hydrocortisone twice daily, x 5 days, then as needed ?

## 2021-10-15 NOTE — Assessment & Plan Note (Signed)
Improved but not at goal, no med chane ?DASH diet and commitment to daily physical activity for a minimum of 30 minutes discussed and encouraged, as a part of hypertension management. ?The importance of attaining a healthy weight is also discussed. ? ? ?  10/12/2021  ?  1:05 PM 10/10/2021  ?  2:31 PM 09/26/2021  ? 10:33 AM 09/12/2021  ? 10:25 AM 08/29/2021  ? 12:05 PM 08/29/2021  ? 11:59 AM 08/29/2021  ? 10:50 AM  ?BP/Weight  ?Systolic BP 993 716 967 893 169  166  ?Diastolic BP 63 78 58 56 68  67  ?Wt. (Lbs) 154.8  156 156  156 156  ?BMI 29.25 kg/m2  29.48 kg/m2 29.48 kg/m2  29.48 kg/m2 29.48 kg/m2  ? ? ? ? ?

## 2021-10-15 NOTE — Assessment & Plan Note (Signed)
Hyperlipidemia:Low fat diet discussed and encouraged. ? ? ?Lipid Panel  ?Lab Results  ?Component Value Date  ? CHOL 137 10/05/2021  ? HDL 61 10/05/2021  ? [place] 61 10/05/2021  ? TRIG 77 10/05/2021  ? CHOLHDL 2.2 10/05/2021  ? ? ? ?Controlled, no change in medication ? ?

## 2021-10-15 NOTE — Assessment & Plan Note (Signed)
?  Patient re-educated about  the importance of commitment to a  minimum of 150 minutes of exercise per week as able. ? ?The importance of healthy food choices with portion control discussed, as well as eating regularly and within a 12 hour window most days. ?The need to choose "clean , green" food 50 to 75% of the time is discussed, as well as to make water the primary drink and set a goal of 64 ounces water daily. ? ?  ? ?  10/12/2021  ?  1:05 PM 09/26/2021  ? 10:33 AM 09/12/2021  ? 10:25 AM  ?Weight /BMI  ?Weight 154 lb 12.8 oz 156 lb 156 lb  ?Height '5\' 1"'$  (1.549 m) '5\' 1"'$  (1.549 m) '5\' 1"'$  (1.549 m)  ?BMI 29.25 kg/m2 29.48 kg/m2 29.48 kg/m2  ? ? ? ?

## 2021-10-15 NOTE — Progress Notes (Signed)
? ?[name] [name]     MRN: [id number]      DOB: [birth year] ? ? ?HPI ?[name] [name] is here for follow up and re-evaluation of chronic medical conditions, medication management and review of any available recent lab and radiology data.  ?Preventive health is updated, specifically  Cancer screening and Immunization.   ?Questions or concerns regarding consultations or procedures which the PT has had in the interim are  addressed. ?The PT denies any adverse reactions to current medications since the last visit.  ?C/o itchy rash on right forearm where tape was laced after blood draw ?ROS ?Denies recent fever or chills. ?Denies sinus pressure, nasal congestion, ear pain or sore throat. ?Denies chest congestion, productive cough or wheezing. ?Denies chest pains, palpitations and leg swelling ?Denies abdominal pain, nausea, vomiting,diarrhea or constipation.   ?Denies dysuria, frequency, hesitancy or incontinence. ?Denies joint pain, swelling and limitation in mobility. ?Denies headaches, seizures, numbness, or tingling. ?Denies depression, anxiety or insomnia. ? ?PE ? ?BP (!) 145/63   Pulse 64   Resp 16   Ht '5\' 1"'$  (1.549 m)   Wt 154 lb 12.8 oz (70.2 kg)   SpO2 95%   BMI 29.25 kg/m?  ? ?Patient alert and oriented and in no cardiopulmonary distress. ? ?HEENT: No facial asymmetry, EOMI,     Neck supple . ? ?Chest: Clear to auscultation bilaterally. ? ?CVS: S1, S2 no murmurs, no S3.Regular rate. ? ?ABD: Soft non tender.  ? ?Ext: No edema ? ?MS: Adequate ROM spine, shoulders, hips and knees. ? ?Skin: Intact, erythematous macular rash on right forearm. ? ?Psych: Good eye contact, normal affect. Memory intact not anxious or depressed appearing. ? ?CNS: CN 2-12 intact, power,  normal throughout.no focal deficits noted. ? ? ?Assessment & Plan ? ?Essential hypertension, malignant ?Improved but not at goal, no med chane ?DASH diet and commitment to daily physical activity for a minimum of 30 minutes discussed and encouraged, as a  part of hypertension management. ?The importance of attaining a healthy weight is also discussed. ? ? ?  10/12/2021  ?  1:05 PM 10/10/2021  ?  2:31 PM 09/26/2021  ? 10:33 AM 09/12/2021  ? 10:25 AM 08/29/2021  ? 12:05 PM 08/29/2021  ? 11:59 AM 08/29/2021  ? 10:50 AM  ?BP/Weight  ?Systolic BP 423 536 144 315 169  166  ?Diastolic BP 63 78 58 56 68  67  ?Wt. (Lbs) 154.8  156 156  156 156  ?BMI 29.25 kg/m2  29.48 kg/m2 29.48 kg/m2  29.48 kg/m2 29.48 kg/m2  ? ? ? ? ? ?Hyperlipidemia LDL goal <70 ?Hyperlipidemia:Low fat diet discussed and encouraged. ? ? ?Lipid Panel  ?Lab Results  ?Component Value Date  ? CHOL 137 10/05/2021  ? HDL 61 10/05/2021  ? [place] 61 10/05/2021  ? TRIG 77 10/05/2021  ? CHOLHDL 2.2 10/05/2021  ? ? ? ?Controlled, no change in medication ? ? ?Hypothyroidism ?Controlled, no change in medication ? ? ?Obesity (BMI 30.0-34.9) ? ?Patient re-educated about  the importance of commitment to a  minimum of 150 minutes of exercise per week as able. ? ?The importance of healthy food choices with portion control discussed, as well as eating regularly and within a 12 hour window most days. ?The need to choose "clean , green" food 50 to 75% of the time is discussed, as well as to make water the primary drink and set a goal of 64 ounces water daily. ? ?  ? ?  10/12/2021  ?  1:05 PM 09/26/2021  ? 10:33 AM 09/12/2021  ? 10:25 AM  ?Weight /BMI  ?Weight 154 lb 12.8 oz 156 lb 156 lb  ?Height '5\' 1"'$  (1.549 m) '5\' 1"'$  (1.549 m) '5\' 1"'$  (1.549 m)  ?BMI 29.25 kg/m2 29.48 kg/m2 29.48 kg/m2  ? ? ? ? ?Dermatitis ?Topical hydrocortisone twice daily, x 5 days, then as needed ? ?

## 2021-10-15 NOTE — Assessment & Plan Note (Signed)
Controlled, no change in medication  

## 2021-10-23 ENCOUNTER — Other Ambulatory Visit: Payer: Self-pay | Admitting: Family Medicine

## 2021-10-24 ENCOUNTER — Encounter (HOSPITAL_COMMUNITY): Payer: Self-pay

## 2021-10-24 ENCOUNTER — Encounter (HOSPITAL_COMMUNITY)
Admission: RE | Admit: 2021-10-24 | Discharge: 2021-10-24 | Disposition: A | Discharge status: Home / Self Care | Payer: Medicare Other | Source: Ambulatory Visit | Attending: Nephrology | Admitting: Nephrology

## 2021-10-24 DIAGNOSIS — D631 Anemia in chronic kidney disease: Secondary | ICD-10-CM | POA: Diagnosis not present

## 2021-10-24 DIAGNOSIS — N1832 Chronic kidney disease, stage 3b: Secondary | ICD-10-CM | POA: Diagnosis not present

## 2021-10-24 LAB — RENAL FUNCTION PANEL
Albumin: 4 g/dL (ref 3.5–5.0)
Anion gap: 7 (ref 5–15)
BUN: 38 mg/dL — ABNORMAL HIGH (ref 8–23)
CO2: 21 mmol/L — ABNORMAL LOW (ref 22–32)
Calcium: 9.7 mg/dL (ref 8.9–10.3)
Chloride: 108 mmol/L (ref 98–111)
Creatinine, Ser: 1.87 mg/dL — ABNORMAL HIGH (ref 0.44–1.00)
GFR, Estimated: 27 mL/min — ABNORMAL LOW (ref >60)
Glucose, Bld: 81 mg/dL (ref 70–99)
Phosphorus: 3.6 mg/dL (ref 2.5–4.6)
Potassium: 4.2 mmol/L (ref 3.5–5.1)
Sodium: 136 mmol/L (ref 135–145)

## 2021-10-24 LAB — CBC WITH DIFFERENTIAL/PLATELET
Abs Immature Granulocytes: 0.01 10*3/uL (ref 0.00–0.07)
Basophils Absolute: 0.1 10*3/uL (ref 0.0–0.1)
Basophils Relative: 1 %
Eosinophils Absolute: 0.5 10*3/uL (ref 0.0–0.5)
Eosinophils Relative: 9 %
HCT: 33 % — ABNORMAL LOW (ref 36.0–46.0)
Hemoglobin: 10.5 g/dL — ABNORMAL LOW (ref 12.0–15.0)
Immature Granulocytes: 0 %
Lymphocytes Relative: 23 %
Lymphs Abs: 1.3 10*3/uL (ref 0.7–4.0)
MCH: 29.2 pg (ref 26.0–34.0)
MCHC: 31.8 g/dL (ref 30.0–36.0)
MCV: 91.7 fL (ref 80.0–100.0)
Monocytes Absolute: 0.5 10*3/uL (ref 0.1–1.0)
Monocytes Relative: 9 %
Neutro Abs: 3.2 10*3/uL (ref 1.7–7.7)
Neutrophils Relative %: 58 %
Platelets: 228 10*3/uL (ref 150–400)
RBC: 3.6 MIL/uL — ABNORMAL LOW (ref 3.87–5.11)
RDW: 13.2 % (ref 11.5–15.5)
WBC: 5.5 10*3/uL (ref 4.0–10.5)
nRBC: 0 % (ref 0.0–0.2)

## 2021-10-24 LAB — IRON AND TIBC
Iron: 165 ug/dL (ref 28–170)
Saturation Ratios: 40 % — ABNORMAL HIGH (ref 10.4–31.8)
TIBC: 408 ug/dL (ref 250–450)
UIBC: 243 ug/dL

## 2021-10-24 LAB — CREATININE CLEARANCE, URINE, 24 HOUR
Collection Interval-CRCL: 24 hours
Creatinine Clearance: 44 mL/min — ABNORMAL LOW (ref 75–115)
Creatinine, 24H Ur: 1174 mg/d (ref 600–1800)
Creatinine, Urine: 35.57 mg/dL
Urine Total Volume-CRCL: 3300 mL

## 2021-10-24 LAB — FERRITIN: Ferritin: 18 ng/mL (ref 11–307)

## 2021-10-24 LAB — PROTEIN / CREATININE RATIO, URINE
Creatinine, Urine: 40.83 mg/dL
Protein Creatinine Ratio: 1.13 mg/mg{Cre} — ABNORMAL HIGH (ref 0.00–0.15)
Total Protein, Urine: 46 mg/dL

## 2021-10-24 LAB — VITAMIN D 25 HYDROXY (VIT D DEFICIENCY, FRACTURES): Vit D, 25-Hydroxy: 41.32 ng/mL (ref 30–100)

## 2021-10-24 LAB — PROTEIN, URINE, 24 HOUR
Collection Interval-UPROT: 24 hours
Protein, 24H Urine: 1254 mg/d — ABNORMAL HIGH (ref 50–100)
Protein, Urine: 38 mg/dL
Urine Total Volume-UPROT: 3300 mL

## 2021-10-24 LAB — POCT HEMOGLOBIN-HEMACUE: Hemoglobin: 10.5 g/dL — ABNORMAL LOW (ref 12.0–15.0)

## 2021-10-24 MED ORDER — EPOETIN ALFA-EPBX 3000 UNIT/ML IJ SOLN: 3000.0000 [IU] | Freq: Once | INTRAMUSCULAR | Status: DC

## 2021-10-25 DIAGNOSIS — H16143 Punctate keratitis, bilateral: Secondary | ICD-10-CM | POA: Diagnosis not present

## 2021-10-25 DIAGNOSIS — H04123 Dry eye syndrome of bilateral lacrimal glands: Secondary | ICD-10-CM | POA: Diagnosis not present

## 2021-10-25 DIAGNOSIS — H40053 Ocular hypertension, bilateral: Secondary | ICD-10-CM | POA: Diagnosis not present

## 2021-10-25 DIAGNOSIS — E119 Type 2 diabetes mellitus without complications: Secondary | ICD-10-CM | POA: Diagnosis not present

## 2021-10-25 DIAGNOSIS — H16213 Exposure keratoconjunctivitis, bilateral: Secondary | ICD-10-CM | POA: Diagnosis not present

## 2021-10-25 DIAGNOSIS — H2513 Age-related nuclear cataract, bilateral: Secondary | ICD-10-CM | POA: Diagnosis not present

## 2021-10-25 LAB — PTH, INTACT AND CALCIUM
Calcium, Total (PTH): 10 mg/dL (ref 8.7–10.3)
PTH: 50 pg/mL (ref 15–65)

## 2021-10-26 LAB — IMMUNOFIXATION, URINE

## 2021-10-30 ENCOUNTER — Ambulatory Visit (INDEPENDENT_AMBULATORY_CARE_PROVIDER_SITE_OTHER): Payer: Medicare Other | Admitting: Gastroenterology

## 2021-11-02 DIAGNOSIS — D631 Anemia in chronic kidney disease: Secondary | ICD-10-CM | POA: Diagnosis not present

## 2021-11-02 DIAGNOSIS — N170 Acute kidney failure with tubular necrosis: Secondary | ICD-10-CM | POA: Diagnosis not present

## 2021-11-02 DIAGNOSIS — R809 Proteinuria, unspecified: Secondary | ICD-10-CM | POA: Diagnosis not present

## 2021-11-02 DIAGNOSIS — N189 Chronic kidney disease, unspecified: Secondary | ICD-10-CM | POA: Diagnosis not present

## 2021-11-02 DIAGNOSIS — N1832 Chronic kidney disease, stage 3b: Secondary | ICD-10-CM | POA: Diagnosis not present

## 2021-11-02 DIAGNOSIS — I5032 Chronic diastolic (congestive) heart failure: Secondary | ICD-10-CM | POA: Diagnosis not present

## 2021-11-02 DIAGNOSIS — I129 Hypertensive chronic kidney disease with stage 1 through stage 4 chronic kidney disease, or unspecified chronic kidney disease: Secondary | ICD-10-CM | POA: Diagnosis not present

## 2021-11-02 LAB — HM DIABETES EYE EXAM

## 2021-11-06 ENCOUNTER — Other Ambulatory Visit: Payer: Self-pay | Admitting: Nephrology

## 2021-11-06 ENCOUNTER — Other Ambulatory Visit (HOSPITAL_COMMUNITY): Payer: Self-pay | Admitting: Nephrology

## 2021-11-06 DIAGNOSIS — N1832 Chronic kidney disease, stage 3b: Secondary | ICD-10-CM

## 2021-11-06 DIAGNOSIS — N183 Chronic kidney disease, stage 3 unspecified: Secondary | ICD-10-CM

## 2021-11-06 DIAGNOSIS — N170 Acute kidney failure with tubular necrosis: Secondary | ICD-10-CM

## 2021-11-07 ENCOUNTER — Encounter (HOSPITAL_COMMUNITY)
Admission: RE | Admit: 2021-11-07 | Discharge: 2021-11-07 | Disposition: A | Discharge status: Home / Self Care | Payer: Medicare Other | Source: Ambulatory Visit | Attending: Nephrology | Admitting: Nephrology

## 2021-11-07 ENCOUNTER — Encounter (HOSPITAL_COMMUNITY): Payer: Self-pay

## 2021-11-07 ENCOUNTER — Ambulatory Visit (HOSPITAL_COMMUNITY)
Admission: RE | Admit: 2021-11-07 | Discharge: 2021-11-07 | Disposition: A | Discharge status: Home / Self Care | Payer: Medicare Other | Source: Ambulatory Visit | Attending: Nephrology | Admitting: Nephrology

## 2021-11-07 DIAGNOSIS — D631 Anemia in chronic kidney disease: Secondary | ICD-10-CM | POA: Diagnosis not present

## 2021-11-07 DIAGNOSIS — N1832 Chronic kidney disease, stage 3b: Secondary | ICD-10-CM | POA: Insufficient documentation

## 2021-11-07 DIAGNOSIS — N170 Acute kidney failure with tubular necrosis: Secondary | ICD-10-CM | POA: Diagnosis not present

## 2021-11-07 DIAGNOSIS — N183 Chronic kidney disease, stage 3 unspecified: Secondary | ICD-10-CM | POA: Diagnosis not present

## 2021-11-07 LAB — POCT HEMOGLOBIN-HEMACUE: Hemoglobin: 9.9 g/dL — ABNORMAL LOW (ref 12.0–15.0)

## 2021-11-07 MED ORDER — EPOETIN ALFA-EPBX 3000 UNIT/ML IJ SOLN: 3000.0000 [IU] | Freq: Once | INTRAMUSCULAR | Status: AC

## 2021-11-07 MED ORDER — EPOETIN ALFA-EPBX 3000 UNIT/ML IJ SOLN
INTRAMUSCULAR | Status: AC
  Administered 2021-11-07: 3000 [IU] via SUBCUTANEOUS
  Filled 2021-11-07: qty 1

## 2021-11-20 ENCOUNTER — Other Ambulatory Visit: Payer: Self-pay | Admitting: Family Medicine

## 2021-11-21 ENCOUNTER — Encounter (HOSPITAL_COMMUNITY): Payer: Medicare Other

## 2021-11-21 ENCOUNTER — Telehealth: Payer: Medicare Other

## 2021-11-23 ENCOUNTER — Encounter (HOSPITAL_COMMUNITY)
Admission: RE | Admit: 2021-11-23 | Discharge: 2021-11-23 | Disposition: A | Discharge status: Home / Self Care | Payer: Medicare Other | Source: Ambulatory Visit | Attending: Nephrology | Admitting: Nephrology

## 2021-11-23 VITALS — BP 138/69 | HR 85 | Resp 18

## 2021-11-23 DIAGNOSIS — N1832 Chronic kidney disease, stage 3b: Secondary | ICD-10-CM | POA: Diagnosis not present

## 2021-11-23 DIAGNOSIS — D631 Anemia in chronic kidney disease: Secondary | ICD-10-CM | POA: Diagnosis not present

## 2021-11-23 LAB — URINALYSIS, ROUTINE W REFLEX MICROSCOPIC
Bacteria, UA: NONE SEEN
Bilirubin Urine: NEGATIVE
Glucose, UA: NEGATIVE mg/dL
Hgb urine dipstick: NEGATIVE
Ketones, ur: NEGATIVE mg/dL
Leukocytes,Ua: NEGATIVE
Nitrite: NEGATIVE
Protein, ur: 30 mg/dL — AB
Specific Gravity, Urine: 1.003 — ABNORMAL LOW (ref 1.005–1.030)
pH: 6 (ref 5.0–8.0)

## 2021-11-23 LAB — RENAL FUNCTION PANEL
Albumin: 4.1 g/dL (ref 3.5–5.0)
Anion gap: 5 (ref 5–15)
BUN: 32 mg/dL — ABNORMAL HIGH (ref 8–23)
CO2: 22 mmol/L (ref 22–32)
Calcium: 9.6 mg/dL (ref 8.9–10.3)
Chloride: 109 mmol/L (ref 98–111)
Creatinine, Ser: 2.01 mg/dL — ABNORMAL HIGH (ref 0.44–1.00)
GFR, Estimated: 25 mL/min — ABNORMAL LOW (ref >60)
Glucose, Bld: 75 mg/dL (ref 70–99)
Phosphorus: 3.9 mg/dL (ref 2.5–4.6)
Potassium: 4.1 mmol/L (ref 3.5–5.1)
Sodium: 136 mmol/L (ref 135–145)

## 2021-11-23 LAB — POCT HEMOGLOBIN-HEMACUE: Hemoglobin: 10.3 g/dL — ABNORMAL LOW (ref 12.0–15.0)

## 2021-11-23 MED ORDER — EPOETIN ALFA-EPBX 3000 UNIT/ML IJ SOLN: 3000.0000 [IU] | Freq: Once | INTRAMUSCULAR | Status: DC

## 2021-11-24 LAB — ANA: Anti Nuclear Antibody (ANA): NEGATIVE

## 2021-11-24 LAB — C3 COMPLEMENT: C3 Complement: 130 mg/dL (ref 82–167)

## 2021-11-24 LAB — C4 COMPLEMENT: Complement C4, Body Fluid: 26 mg/dL (ref 12–38)

## 2021-11-28 LAB — ANCA TITERS
Atypical P-ANCA titer: <1:20 {titer}
C-ANCA: <1:20 {titer}
P-ANCA: <1:20 {titer}

## 2021-11-30 DIAGNOSIS — I129 Hypertensive chronic kidney disease with stage 1 through stage 4 chronic kidney disease, or unspecified chronic kidney disease: Secondary | ICD-10-CM | POA: Diagnosis not present

## 2021-11-30 DIAGNOSIS — I5032 Chronic diastolic (congestive) heart failure: Secondary | ICD-10-CM | POA: Diagnosis not present

## 2021-11-30 DIAGNOSIS — N170 Acute kidney failure with tubular necrosis: Secondary | ICD-10-CM | POA: Diagnosis not present

## 2021-11-30 DIAGNOSIS — D631 Anemia in chronic kidney disease: Secondary | ICD-10-CM | POA: Diagnosis not present

## 2021-11-30 DIAGNOSIS — N189 Chronic kidney disease, unspecified: Secondary | ICD-10-CM | POA: Diagnosis not present

## 2021-11-30 DIAGNOSIS — N184 Chronic kidney disease, stage 4 (severe): Secondary | ICD-10-CM | POA: Diagnosis not present

## 2021-11-30 DIAGNOSIS — R809 Proteinuria, unspecified: Secondary | ICD-10-CM | POA: Diagnosis not present

## 2021-12-05 ENCOUNTER — Ambulatory Visit (INDEPENDENT_AMBULATORY_CARE_PROVIDER_SITE_OTHER): Payer: Medicare Other | Admitting: *Deleted

## 2021-12-05 DIAGNOSIS — I10 Essential (primary) hypertension: Secondary | ICD-10-CM

## 2021-12-05 DIAGNOSIS — I2510 Atherosclerotic heart disease of native coronary artery without angina pectoris: Secondary | ICD-10-CM

## 2021-12-05 NOTE — Patient Instructions (Signed)
Visit Information  Thank you for taking time to visit with me today. Please don't hesitate to contact me if I can be of assistance to you before our next scheduled telephone appointment.  Following are the goals we discussed today:  Take medications as prescribed   Attend all scheduled provider appointments Perform all self care activities independently  Call provider office for new concerns or questions  check blood pressure 3 times per week take blood pressure log to all doctor appointments keep all doctor appointments take medications for blood pressure exactly as prescribed begin an exercise program report new symptoms to your doctor eat more whole grains, fruits and vegetables, lean meats and healthy fats Practice good handwashing and avoid sick people Eat heart healthy diet, avoid trans/ saturated fats- bake or broil foods instead of frying Follow low sodium diet, read food labels Try to get outdoors daily and walk  Our next appointment is by telephone on 02/27/22 at 1045 am  Please call the care guide team at [phone] if you need to cancel or reschedule your appointment.   If you are experiencing a Mental Health or [place] or need someone to talk to, please call the Suicide and Crisis Lifeline: 988 call the [place] National Suicide Prevention Lifeline: (669)563-8459 or TTY: 2348742871 TTY 515-482-4633) to talk to a trained counselor call 1-800-273-TALK (toll free, 24 hour hotline) go to [facility] [address] [place] [phone]) call the Impact: [phone] call 911   The patient verbalized understanding of instructions, educational materials, and care plan provided today and DECLINED offer to receive copy of patient instructions, educational materials, and care plan.   Jacqlyn Larsen [facility], BSN RN Case Manager [place] [facility] [phone]

## 2021-12-05 NOTE — Chronic Care Management (AMB) (Signed)
Chronic Care Management   CCM RN Visit Note  12/05/2021 Name: [name] [name] MRN: [id number] DOB: 03-Dec-1940  Subjective: [name] [name] is a 81 y.o. year old female who is a primary care patient of Fayrene Helper, MD. The care management team was consulted for assistance with disease management and care coordination needs.    Engaged with patient by telephone for follow up visit in response to provider referral for case management and/or care coordination services.   Consent to Services:  The patient was given information about Chronic Care Management services, agreed to services, and gave verbal consent prior to initiation of services.  Please see initial visit note for detailed documentation.   Patient agreed to services and verbal consent obtained.   Assessment: Review of patient past medical history, allergies, medications, health status, including review of consultants reports, laboratory and other test data, was performed as part of comprehensive evaluation and provision of chronic care management services.   SDOH (Social Determinants of Health) assessments and interventions performed:    CCM Care Plan  Allergies  Allergen Reactions   Tramadol Itching   Lisinopril Cough   Shrimp (Diagnostic)     Gout flares   Clonidine Derivatives Other (See Comments)    Zombie BP dropped    Outpatient Encounter Medications as of 12/05/2021  Medication Sig   amLODipine (NORVASC) 5 MG tablet Take one tablet by mouth twice daily for blood pressure   aspirin 81 MG tablet Take 1 tablet (81 mg total) by mouth daily. Restart on 03/02/18   cetirizine (ZYRTEC) 10 MG tablet TAKE ONE TABLET BY MOUTH DAILY   ferrous sulfate 325 (65 FE) MG tablet Take 325 mg by mouth daily with breakfast.   fish oil-omega-3 fatty acids 1000 MG capsule Take 1 g by mouth daily.   fluticasone (FLONASE) 50 MCG/ACT nasal spray USE 2 SPRAYS IN EACH NOSTRIL ONCE DAILY. SHAKE GENTLY BEFORE USING. (Patient taking  differently: Place 1 spray into both nostrils daily as needed for allergies.)   hydrALAZINE (APRESOLINE) 100 MG tablet Take 100 mg by mouth 2 (two) times daily.   hydrocortisone cream 0.5 % Apply sparingly to rash twice daily for 5 t days, then , as needed   latanoprost (XALATAN) 0.005 % ophthalmic solution Place 1 drop into both eyes at bedtime.   levothyroxine (SYNTHROID) 100 MCG tablet TAKE ONE TABLET BY MOUTH DAILY   metoprolol succinate (TOPROL-XL) 100 MG 24 hr tablet TAKE ONE TABLET BY MOUTH DAILY. TAKE WITH OR IMMEDIATELY FOLLOWING A MEAL.   nitroGLYCERIN (NITROSTAT) 0.4 MG SL tablet DISSOLVE ONE TABLET UNDER TONGUE EVERY 5 MINUTES UP TO 3 DOSES AS NEEDED FOR CHEST PAIN   olmesartan (BENICAR) 20 MG tablet Take 20 mg by mouth 2 (two) times daily.   Polyvinyl Alcohol-Povidone (REFRESH OP) Place 1 drop into both eyes at bedtime.   rosuvastatin (CRESTOR) 10 MG tablet TAKE ONE TABLET BY MOUTH DAILY.   SYSTANE ULTRA 0.4-0.3 % SOLN Place 1 drop into both eyes daily as needed (dry eyes).   trolamine salicylate (ASPERCREME) 10 % cream Apply 1 application topically as needed for muscle pain.   UNABLE TO FIND Shower chair x 1  DX M54.36   omeprazole (PRILOSEC) 20 MG capsule TAKE ONE CAPSULE BY MOUTH ONCE DAILY (Patient not taking: Reported on 12/05/2021)   No facility-administered encounter medications on file as of 12/05/2021.    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   Neuroendocrine tumor 06/10/2021   Abnormal findings on esophagogastroduodenoscopy (EGD) 06/10/2021   Iron deficiency anemia 06/10/2021   Transportation insecurity due to excessive travel time to destination 05/13/2021   Carpal tunnel syndrome, right upper limb 11/02/2020   Numbness of fingers 10/26/2020   Gout 08/04/2018   Hyperuricemia 08/04/2018   Osteoarthritis of right knee 03/04/2018   CKD (chronic kidney disease) stage 3, GFR 30-59  ml/min (HCC) 02/26/2018   Carotid stenosis 02/01/2018   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   GERD (gastroesophageal reflux disease) 11/10/2013   Bradycardia, sinus 04/07/1218   Metabolic syndrome X 75/88/3254   CAD (coronary artery disease)    Ejection fraction    CKD (chronic kidney disease) 07/11/2011   Hypothyroidism 01/31/2009   CORONARY ARTERY BYPASS GRAFT, HX OF 01/31/2009   Carotid artery disease ([place]) 11/30/2008   Allergic rhinitis 08/12/2008   Hyperlipidemia LDL goal <70 10/07/2007   Obesity (BMI 30.0-34.9) 10/07/2007   Essential hypertension, malignant 10/07/2007    Conditions to be addressed/monitored:CAD and HTN  Care Plan : RN Care Manager Plan of Care  Updates made by Kassie Mends, RN since 12/05/2021 12:00 AM     Problem: No plan of care established for management of chronic disease states  (HTN, CAD)   Priority: High     Long-Range Goal: Development of plan of care for chronic disease management  (HTN, CAD)   Start Date: 06/22/2021  Expected End Date: 06/03/2022  Priority: High  Note:   Current Barriers:  Knowledge Deficits related to plan of care for management of CAD and HTN  Ineffective Self Health Maintenance in a patient with CAD- patient reports she lives with spouse, is overall independent with ADL, IADL's, continues to drive. Reports received flu vaccine. Patient reports she does not always eat heart healthy diet but she does try as much as possible to eat healthy. Knowledge Deficits related to basic understanding of hypertension pathophysiology and self care management- pt reports she has blood pressure cuff and checks blood pressure weekly at least,  recent reading 130/76, reports she did receive flu vaccine.  Reports receives retacrit injection for iron deficiency anemia related to chronic blood loss and continues q 2 weeks, having hemoglobin checks, states " If hemoglobin is low then I get the injection"  pt reports she  had PET scan on 11/18 and "shows no cancer" and had endoscopy on 2/16 to  "have lesions removed out of my stomach" Pt reports she did have the endoscopy and states " they couldn't find the polyp"  Pt reports they may want her to come back at a later time to have endoscopy repeated and pt is declining at present. Does not adhere to provider recommendations re: does not always eat low sodium diet RNCM Clinical Goal(s):  Patient will verbalize understanding of plan for management of CAD and HTN as evidenced by pt report, review of EHR and  through collaboration with RN Care manager, provider, and care team.   Interventions: 1:1 collaboration with primary care provider regarding development and update of comprehensive plan of care as evidenced by provider attestation and co-signature Inter-disciplinary care team collaboration (see longitudinal plan of care) Evaluation of current treatment plan related to  self management and patient's adherence to plan as established by provider  CAD Interventions: (Status:  Goal on track:  Yes.) Long Term Goal Assessed understanding of CAD diagnosis Medications reviewed including medications utilized in CAD treatment plan Provided education on importance of  blood pressure control in management of CAD Provided education on Importance of limiting foods high in cholesterol Counseled on importance of regular laboratory monitoring as prescribed Counseled on the importance of exercise goals with target of 150 minutes per week Reviewed Importance of taking all medications as prescribed Reviewed Importance of attending all scheduled provider appointments Reinforced heart healthy diet Reinforced good handwashing and avoiding sick people  Hypertension Interventions:  (Status:  Goal on track:  Yes.) Long Term Goal Last practice recorded BP readings:  BP Readings from Last 3 Encounters:  06/15/21 (!) 167/70  06/09/21 (!) 172/68  06/01/21 (!) 171/59  Most recent  eGFR/CrCl:  Lab Results  Component Value Date   EGFR 39 (L) 05/10/2021    No components found for: CRCL  Evaluation of current treatment plan related to hypertension self management and patient's adherence to plan as established by provider Reviewed medications with patient and discussed importance of compliance Advised patient, providing education and rationale, to monitor blood pressure daily and record, calling PCP for findings outside established parameters Discussed complications of poorly controlled blood pressure such as heart disease, stroke, circulatory complications, vision complications, kidney impairment, sexual dysfunction  Reinforced low sodium diet, importance of reading lables Reviewed all upcoming scheduled appointments Pain assessment completed Reviewed importance of drinking adequate fluids, preferably water  Patient Goals/Self-Care Activities: Take medications as prescribed   Attend all scheduled provider appointments Perform all self care activities independently  Call provider office for new concerns or questions  check blood pressure 3 times per week take blood pressure log to all doctor appointments keep all doctor appointments take medications for blood pressure exactly as prescribed begin an exercise program report new symptoms to your doctor eat more whole grains, fruits and vegetables, lean meats and healthy fats Practice good handwashing and avoid sick people Eat heart healthy diet, avoid trans/ saturated fats- bake or broil foods instead of frying Follow low sodium diet, read food labels Try to get outdoors daily and walk      Plan:Telephone follow up appointment with care management team member scheduled for:  02/27/22  Jacqlyn Larsen [facility], BSN RN Case Manager [facility] [phone]

## 2021-12-07 ENCOUNTER — Encounter (HOSPITAL_COMMUNITY)
Admission: RE | Admit: 2021-12-07 | Discharge: 2021-12-07 | Disposition: A | Discharge status: Home / Self Care | Payer: Medicare Other | Source: Ambulatory Visit | Attending: Nephrology | Admitting: Nephrology

## 2021-12-07 DIAGNOSIS — N1831 Chronic kidney disease, stage 3a: Secondary | ICD-10-CM | POA: Insufficient documentation

## 2021-12-07 LAB — POCT HEMOGLOBIN-HEMACUE: Hemoglobin: 9.7 g/dL — ABNORMAL LOW (ref 12.0–15.0)

## 2021-12-07 MED ORDER — EPOETIN ALFA-EPBX 3000 UNIT/ML IJ SOLN
INTRAMUSCULAR | Status: AC
  Filled 2021-12-07: qty 1

## 2021-12-07 MED ORDER — EPOETIN ALFA-EPBX 3000 UNIT/ML IJ SOLN
3000.0000 [IU] | Freq: Once | INTRAMUSCULAR | Status: AC
  Administered 2021-12-07: 3000 [IU] via SUBCUTANEOUS

## 2021-12-11 ENCOUNTER — Telehealth: Payer: Self-pay | Admitting: Family Medicine

## 2021-12-11 ENCOUNTER — Ambulatory Visit (INDEPENDENT_AMBULATORY_CARE_PROVIDER_SITE_OTHER): Payer: Medicare Other

## 2021-12-11 DIAGNOSIS — Z0000 Encounter for general adult medical examination without abnormal findings: Secondary | ICD-10-CM | POA: Diagnosis not present

## 2021-12-11 NOTE — Patient Instructions (Signed)
[name] [name] , Thank you for taking time to come for your Medicare Wellness Visit. I appreciate your ongoing commitment to your health goals. Please review the following plan we discussed and let me know if I can assist you in the future.   Screening recommendations/referrals: Colonoscopy: Complete Mammogram: Scheduled  Bone Density: Complete Recommended yearly ophthalmology/optometry visit for glaucoma screening and checkup Recommended yearly dental visit for hygiene and checkup  Vaccinations: Influenza vaccine: Complete Pneumococcal vaccine: Complete Tdap vaccine: Due now Shingles vaccine: Due now    Advanced directives: Mailed information  Conditions/risks identified: Hypertension   Next appointment: 1 year   Preventive Care 19 Years and Older, Female Preventive care refers to lifestyle choices and visits with your health care provider that can promote health and wellness. What does preventive care include? A yearly physical exam. This is also called an annual well check. Dental exams once or twice a year. Routine eye exams. Ask your health care provider how often you should have your eyes checked. Personal lifestyle choices, including: Daily care of your teeth and gums. Regular physical activity. Eating a healthy diet. Avoiding tobacco and drug use. Limiting alcohol use. Practicing safe sex. Taking low-dose aspirin every day. Taking vitamin and mineral supplements as recommended by your health care provider. What happens during an annual well check? The services and screenings done by your health care provider during your annual well check will depend on your age, overall health, lifestyle risk factors, and family history of disease. Counseling  Your health care provider may ask you questions about your: Alcohol use. Tobacco use. Drug use. Emotional well-being. Home and relationship well-being. Sexual activity. Eating habits. History of falls. Memory and ability to  understand (cognition). Work and work Statistician. Reproductive health. Screening  You may have the following tests or measurements: Height, weight, and BMI. Blood pressure. Lipid and cholesterol levels. These may be checked every 5 years, or more frequently if you are over 11 years old. Skin check. Lung cancer screening. You may have this screening every year starting at age 50 if you have a 30-pack-year history of smoking and currently smoke or have quit within the past 15 years. Fecal occult blood test (FOBT) of the stool. You may have this test every year starting at age 75. Flexible sigmoidoscopy or colonoscopy. You may have a sigmoidoscopy every 5 years or a colonoscopy every 10 years starting at age 45. Hepatitis C blood test. Hepatitis B blood test. Sexually transmitted disease (STD) testing. Diabetes screening. This is done by checking your blood sugar (glucose) after you have not eaten for a while (fasting). You may have this done every 1-3 years. Bone density scan. This is done to screen for osteoporosis. You may have this done starting at age 78. Mammogram. This may be done every 1-2 years. Talk to your health care provider about how often you should have regular mammograms. Talk with your health care provider about your test results, treatment options, and if necessary, the need for more tests. Vaccines  Your health care provider may recommend certain vaccines, such as: Influenza vaccine. This is recommended every year. Tetanus, diphtheria, and acellular pertussis (Tdap, Td) vaccine. You may need a Td booster every 10 years. Zoster vaccine. You may need this after age 19. Pneumococcal 13-valent conjugate (PCV13) vaccine. One dose is recommended after age 34. Pneumococcal polysaccharide (PPSV23) vaccine. One dose is recommended after age 75. Talk to your health care provider about which screenings and vaccines you need and how often you  need them. This information is not  intended to replace advice given to you by your health care provider. Make sure you discuss any questions you have with your health care provider. Document Released: 07/15/2015 Document Revised: 03/07/2016 Document Reviewed: 04/19/2015 Elsevier Interactive Patient Education  2017 [place] Prevention in the Home Falls can cause injuries. They can happen to people of all ages. There are many things you can do to make your home safe and to help prevent falls. What can I do on the outside of my home? Regularly fix the edges of walkways and driveways and fix any cracks. Remove anything that might make you trip as you walk through a door, such as a raised step or threshold. Trim any bushes or trees on the path to your home. Use bright outdoor lighting. Clear any walking paths of anything that might make someone trip, such as rocks or tools. Regularly check to see if handrails are loose or broken. Make sure that both sides of any steps have handrails. Any raised decks and porches should have guardrails on the edges. Have any leaves, snow, or ice cleared regularly. Use sand or salt on walking paths during winter. Clean up any spills in your garage right away. This includes oil or grease spills. What can I do in the bathroom? Use night lights. Install grab bars by the toilet and in the tub and shower. Do not use towel bars as grab bars. Use non-skid mats or decals in the tub or shower. If you need to sit down in the shower, use a plastic, non-slip stool. Keep the floor dry. Clean up any water that spills on the floor as soon as it happens. Remove soap buildup in the tub or shower regularly. Attach bath mats securely with double-sided non-slip rug tape. Do not have throw rugs and other things on the floor that can make you trip. What can I do in the bedroom? Use night lights. Make sure that you have a light by your bed that is easy to reach. Do not use any sheets or blankets that are  too big for your bed. They should not hang down onto the floor. Have a firm chair that has side arms. You can use this for support while you get dressed. Do not have throw rugs and other things on the floor that can make you trip. What can I do in the kitchen? Clean up any spills right away. Avoid walking on wet floors. Keep items that you use a lot in easy-to-reach places. If you need to reach something above you, use a strong step stool that has a grab bar. Keep electrical cords out of the way. Do not use floor polish or wax that makes floors slippery. If you must use wax, use non-skid floor wax. Do not have throw rugs and other things on the floor that can make you trip. What can I do with my stairs? Do not leave any items on the stairs. Make sure that there are handrails on both sides of the stairs and use them. Fix handrails that are broken or loose. Make sure that handrails are as long as the stairways. Check any carpeting to make sure that it is firmly attached to the stairs. Fix any carpet that is loose or worn. Avoid having throw rugs at the top or bottom of the stairs. If you do have throw rugs, attach them to the floor with carpet tape. Make sure that you have a light switch  at the top of the stairs and the bottom of the stairs. If you do not have them, ask someone to add them for you. What else can I do to help prevent falls? Wear shoes that: Do not have high heels. Have rubber bottoms. Are comfortable and fit you well. Are closed at the toe. Do not wear sandals. If you use a stepladder: Make sure that it is fully opened. Do not climb a closed stepladder. Make sure that both sides of the stepladder are locked into place. Ask someone to hold it for you, if possible. Clearly mark and make sure that you can see: Any grab bars or handrails. First and last steps. Where the edge of each step is. Use tools that help you move around (mobility aids) if they are needed. These  include: Canes. Walkers. Scooters. Crutches. Turn on the lights when you go into a dark area. Replace any light bulbs as soon as they burn out. Set up your furniture so you have a clear path. Avoid moving your furniture around. If any of your floors are uneven, fix them. If there are any pets around you, be aware of where they are. Review your medicines with your doctor. Some medicines can make you feel dizzy. This can increase your chance of falling. Ask your doctor what other things that you can do to help prevent falls. This information is not intended to replace advice given to you by your health care provider. Make sure you discuss any questions you have with your health care provider. Document Released: 04/14/2009 Document Revised: 11/24/2015 Document Reviewed: 07/23/2014 Elsevier Interactive Patient Education  2017 [employer].

## 2021-12-11 NOTE — Progress Notes (Signed)
Subjective:   [name] [name] is a 81 y.o. female who presents for Medicare Annual (Subsequent) preventive examination.  Review of Systems    I connected with  Barba P Bougie on 12/11/21 by a audio enabled telemedicine application and verified that I am speaking with the correct person using two identifiers.  Patient Location: Home  Provider Location: Office/Clinic  I discussed the limitations of evaluation and management by telemedicine. The patient expressed understanding and agreed to proceed.  Cardiac Risk Factors include: advanced age (>30mn, >>22women)     Objective:    Today's Vitals   12/11/21 1319  PainSc: 0-No pain   There is no height or weight on file to calculate BMI.     12/11/2021    1:24 PM 08/17/2021    7:20 AM 04/27/2021   10:45 AM 04/25/2021    3:10 PM 03/22/2021    7:12 AM 03/15/2021    9:36 AM 12/07/2020    1:48 PM  Advanced Directives  Does Patient Have a Medical Advance Directive? No No No No No No No  Would patient like information on creating a medical advance directive? No - Patient declined Yes (MAU/Ambulatory/Procedural Areas - Information given)  Yes (MAU/Ambulatory/Procedural Areas - Information given) No - Patient declined No - Patient declined No - Patient declined    Current Medications (verified) Outpatient Encounter Medications as of 12/11/2021  Medication Sig   chlorthalidone (HYGROTON) 25 MG tablet Take by mouth.   amLODipine (NORVASC) 5 MG tablet Take one tablet by mouth twice daily for blood pressure   aspirin 81 MG tablet Take 1 tablet (81 mg total) by mouth daily. Restart on 03/02/18   cetirizine (ZYRTEC) 10 MG tablet TAKE ONE TABLET BY MOUTH DAILY   ferrous sulfate 325 (65 FE) MG tablet Take 325 mg by mouth daily with breakfast.   fish oil-omega-3 fatty acids 1000 MG capsule Take 1 g by mouth daily.   fluticasone (FLONASE) 50 MCG/ACT nasal spray USE 2 SPRAYS IN EACH NOSTRIL ONCE DAILY. SHAKE GENTLY BEFORE USING. (Patient taking  differently: Place 1 spray into both nostrils daily as needed for allergies.)   hydrALAZINE (APRESOLINE) 100 MG tablet Take 100 mg by mouth 2 (two) times daily.   hydrocortisone cream 0.5 % Apply sparingly to rash twice daily for 5 t days, then , as needed   latanoprost (XALATAN) 0.005 % ophthalmic solution Place 1 drop into both eyes at bedtime.   levothyroxine (SYNTHROID) 100 MCG tablet TAKE ONE TABLET BY MOUTH DAILY   metoprolol succinate (TOPROL-XL) 100 MG 24 hr tablet TAKE ONE TABLET BY MOUTH DAILY. TAKE WITH OR IMMEDIATELY FOLLOWING A MEAL.   nitroGLYCERIN (NITROSTAT) 0.4 MG SL tablet DISSOLVE ONE TABLET UNDER TONGUE EVERY 5 MINUTES UP TO 3 DOSES AS NEEDED FOR CHEST PAIN   olmesartan (BENICAR) 20 MG tablet Take 20 mg by mouth 2 (two) times daily.   omeprazole (PRILOSEC) 20 MG capsule TAKE ONE CAPSULE BY MOUTH ONCE DAILY (Patient not taking: Reported on 12/05/2021)   Polyvinyl Alcohol-Povidone (REFRESH OP) Place 1 drop into both eyes at bedtime.   rosuvastatin (CRESTOR) 10 MG tablet TAKE ONE TABLET BY MOUTH DAILY.   SYSTANE ULTRA 0.4-0.3 % SOLN Place 1 drop into both eyes daily as needed (dry eyes).   trolamine salicylate (ASPERCREME) 10 % cream Apply 1 application topically as needed for muscle pain.   UNABLE TO FIND Shower chair x 1  DX M54.36   No facility-administered encounter medications on file as of 12/11/2021.  Allergies (verified) Tramadol, Lisinopril, Shrimp (diagnostic), and Clonidine derivatives   History: Past Medical History:  Diagnosis Date   CAD (coronary artery disease)    Multivessel status post CABG October 2005   Carotid artery disease ([place])    CKD (chronic kidney disease), stage III ([place])    Essential hypertension    Glaucoma    Laser surgery 2010   Hyperlipidemia    Hypothyroidism    Iron deficiency anemia    Obesity    Past Surgical History:  Procedure Laterality Date   BIOPSY  04/27/2021   Procedure: BIOPSY;  Surgeon: Milus Banister, MD;   [facility]: [facility];  Service: Endoscopy;;   COLONOSCOPY N/A 02/04/2013   Procedure: COLONOSCOPY;  Surgeon: Rogene Houston, MD;  [facility];  Service: Endoscopy;  Laterality: N/A;  1015   COLONOSCOPY N/A 12/21/2016   Procedure: COLONOSCOPY;  Surgeon: Rogene Houston, MD;  [facility];  Service: Endoscopy;  Laterality: N/A;   COLONOSCOPY WITH PROPOFOL N/A 03/22/2021   Procedure: COLONOSCOPY WITH PROPOFOL;  Surgeon: Rogene Houston, MD;  [facility];  Service: Endoscopy;  Laterality: N/A;  8:15   CORONARY ARTERY BYPASS GRAFT  October 2005   Dr. Roxy Manns - LIMA to LAD, SVG to OM 2 and OM 3, SVG to PDA   ESOPHAGOGASTRODUODENOSCOPY N/A 12/21/2016   Procedure: ESOPHAGOGASTRODUODENOSCOPY (EGD);  Surgeon: Rogene Houston, MD;  [facility];  Service: Endoscopy;  Laterality: N/A;  11:10   ESOPHAGOGASTRODUODENOSCOPY N/A 02/27/2018   Procedure: ESOPHAGOGASTRODUODENOSCOPY (EGD);  Surgeon: Rogene Houston, MD;  [facility];  Service: Endoscopy;  Laterality: N/A;   ESOPHAGOGASTRODUODENOSCOPY (EGD) WITH PROPOFOL N/A 04/27/2021   Procedure: ESOPHAGOGASTRODUODENOSCOPY (EGD) WITH PROPOFOL;  Surgeon: Milus Banister, MD;  [facility];  Service: Endoscopy;  Laterality: N/A;   ESOPHAGOGASTRODUODENOSCOPY (EGD) WITH PROPOFOL N/A 08/17/2021   Procedure: ESOPHAGOGASTRODUODENOSCOPY (EGD) WITH PROPOFOL;  Surgeon: Rush Landmark Telford Nab., MD;  Location: [place];  Service: Gastroenterology;  Laterality: N/A;   EUS N/A 04/27/2021   Procedure: UPPER ENDOSCOPIC ULTRASOUND (EUS) RADIAL;  Surgeon: Milus Banister, MD;  [facility];  Service: Endoscopy;  Laterality: N/A;   EYE SURGERY  2010   laser treatment to both eyes for glaucoma   GIVENS CAPSULE STUDY N/A 03/07/2018   Procedure: GIVENS CAPSULE STUDY;  Surgeon: Rogene Houston, MD;  [facility];  Service: Endoscopy;  Laterality: N/A;   GIVENS CAPSULE STUDY N/A 02/05/2020    Procedure: GIVENS CAPSULE STUDY;  Surgeon: Rogene Houston, MD;  [facility];  Service: Endoscopy;  Laterality: N/A;  730   POLYPECTOMY  03/22/2021   Procedure: POLYPECTOMY;  Surgeon: Rogene Houston, MD;  [facility];  Service: Endoscopy;;   UPPER ESOPHAGEAL ENDOSCOPIC ULTRASOUND (EUS) N/A 08/17/2021   Procedure: UPPER ESOPHAGEAL ENDOSCOPIC ULTRASOUND (EUS);  Surgeon: Irving Copas., MD;  Location: [place];  Service: Gastroenterology;  Laterality: N/A;   Family History  Problem Relation Age of Onset   Aneurysm Mother    Hypertension Mother    Coronary artery disease Mother    Coronary artery disease Father    Diabetes Sister    Coronary artery disease Sister    Diverticulosis Sister    COPD Sister    Kidney disease Sister    Diabetes Sister    Hypertension Sister    Breast cancer Sister 75   Hearing loss Sister    Heart Problems Sister  pacemaker   Colon cancer Brother 91       lived 24 months afterwards   Liver cancer Brother 59   Diabetes Son    Pancreatic cancer Son    Esophageal cancer Neg Hx    Inflammatory bowel disease Neg Hx    Stomach cancer Neg Hx    Social History   Socioeconomic History   Marital status: Married    Spouse name: Not on file   Number of children: Not on file   Years of education: Not on file   Highest education level: Not on file  Occupational History   Occupation: retired   Tobacco Use   Smoking status: Former    Packs/day: 0.25    Years: 40.00    Total pack years: 10.00    Types: Cigarettes    Quit date: 07/03/2003    Years since quitting: 18.4   Smokeless tobacco: Never  Vaping Use   Vaping Use: Never used  Substance and Sexual Activity   Alcohol use: No    Alcohol/week: 0.0 standard drinks of alcohol   Drug use: No   Sexual activity: Never  Other Topics Concern   Not on file  Social History Narrative   Not on file   Social Determinants of Health   Financial Resource Strain: Low  Risk  (12/11/2021)   Overall Financial Resource Strain (CARDIA)    Difficulty of Paying Living Expenses: Not hard at all  Food Insecurity: No Food Insecurity (12/11/2021)   Hunger Vital Sign    Worried About Running Out of Food in the Last Year: Never true    [place] in the Last Year: Never true  Transportation Needs: No Transportation Needs (12/11/2021)   PRAPARE - Hydrologist (Medical): No    Lack of Transportation (Non-Medical): No  Physical Activity: Insufficiently Active (12/11/2021)   Exercise Vital Sign    Days of Exercise per Week: 5 days    Minutes of Exercise per Session: 10 min  Stress: No Stress Concern Present (12/11/2021)   [place]    Feeling of Stress : Not at all  Social Connections: Moderately Integrated (12/11/2021)   Social Connection and Isolation Panel [NHANES]    Frequency of Communication with Friends and Family: More than three times a week    Frequency of Social Gatherings with Friends and Family: More than three times a week    Attends Religious Services: More than 4 times per year    Active Member of [employer] or Organizations: No    Attends Archivist Meetings: Never    Marital Status: Married    Tobacco Counseling Counseling given: Not Answered   Clinical Intake:  Pre-visit preparation completed: Yes Ms. Piano , Thank you for taking time to come for your Medicare Wellness Visit. I appreciate your ongoing commitment to your health goals. Please review the following plan we discussed and let me know if I can assist you in the future.   These are the goals we discussed:  Goals      Patient Stated     Stay in good health.        This is a list of the screening recommended for you and due dates:  Health Maintenance  Topic Date Due   COVID-19 Vaccine (6 - Booster for Pfizer series) 07/12/2021   Zoster (Shingles) Vaccine (2 of 2) 12/07/2021    Tetanus Vaccine  04/19/2022*  Flu Shot  01/30/2022   Colon Cancer Screening  03/22/2026   Pneumonia Vaccine  Completed   DEXA scan (bone density measurement)  Completed   Hepatitis C Screening: USPSTF Recommendation to screen - Ages 51-79 yo.  Completed   HPV Vaccine  Aged Out  *Topic was postponed. The date shown is not the original due date.     Pain : No/denies pain Pain Score: 0-No pain     BMI - recorded: 29.26 Nutritional Status: BMI 25 -29 Overweight Nutritional Risks: None Diabetes: No  How often do you need to have someone help you when you read instructions, pamphlets, or other written materials from your doctor or pharmacy?: 1 - Never What is the last grade level you completed in school?: 12  Diabetic?no  Interpreter Needed?: No      Activities of Daily Living    12/11/2021    1:25 PM 03/15/2021    9:38 AM  In your present state of health, do you have any difficulty performing the following activities:  Hearing? 0   Vision? 0   Difficulty concentrating or making decisions? 0   Walking or climbing stairs? 1   Dressing or bathing? 0   Doing errands, shopping? 0 0  Preparing Food and eating ? N   Using the Toilet? N   In the past six months, have you accidently leaked urine? Y   Do you have problems with loss of bowel control? N   Managing your Medications? N   Managing your Finances? N   Housekeeping or managing your Housekeeping? Y     Patient Care Team: Fayrene Helper, MD as PCP - General (Family Medicine) Satira Sark, MD as PCP - Cardiology (Cardiology) Carlena Bjornstad, MD as Consulting Physician (Cardiology) Warden Fillers, MD as Consulting Physician (Optometry) Steffanie Rainwater, DPM as Consulting Physician (Podiatry) Kassie Mends, RN as [place] any recent Medical Services you may have received from other than [facility] providers in the past year (date may be approximate).     Assessment:    This is a routine wellness examination for [place]ia.  Hearing/Vision screen No results found.  Dietary issues and exercise activities discussed: Current Exercise Habits: The patient does not participate in regular exercise at present, Exercise limited by: None identified   Goals Addressed             This Visit's Progress    Patient Stated       Stay in good health.      Depression Screen    12/11/2021    1:25 PM 12/11/2021    1:22 PM 08/10/2021    1:00 PM 05/10/2021    1:05 PM 04/25/2021    2:41 PM 12/07/2020    1:50 PM 10/26/2020    1:26 PM  PHQ 2/9 Scores  PHQ - 2 Score 0 0 0 0 0 0 0  PHQ- 9 Score    0       Fall Risk    12/11/2021    1:24 PM 10/12/2021    1:08 PM 08/10/2021    1:00 PM 05/10/2021    1:05 PM 04/25/2021    2:41 PM  [place] in the past year? 0 0 0 0 0  Number falls in past yr: 0 0 0    Injury with Fall? 0 0 0    Risk for fall due to : No Fall Risks  No Fall Risks  Follow up Falls evaluation completed  Falls evaluation completed      FALL RISK PREVENTION PERTAINING TO THE HOME:  Any stairs in or around the home? Yes  If so, are there any without handrails? Yes  Home free of loose throw rugs in walkways, pet beds, electrical cords, etc? Yes  Adequate lighting in your home to reduce risk of falls? Yes   ASSISTIVE DEVICES UTILIZED TO PREVENT FALLS:  Life alert? No  Use of a cane, walker or w/c? Yes  Grab bars in the bathroom? Yes  Shower chair or bench in shower? Yes  Elevated toilet seat or a handicapped toilet? Yes     Cognitive Function:    12/11/2021    1:25 PM  MMSE - Mini Mental State Exam  Not completed: Unable to complete        12/11/2021    1:26 PM 11/16/2019   10:13 AM 11/11/2018    8:45 AM 11/08/2017    9:44 AM 09/20/2016    2:07 PM  6CIT Screen  What Year? 0 points 0 points 0 points 0 points 0 points  What month? 0 points 0 points 0 points 0 points 0 points  What time? 0 points 0 points 0 points 0 points 0  points  Count back from 20 0 points 0 points 0 points 0 points 0 points  Months in reverse 0 points 0 points 0 points 0 points 0 points  Repeat phrase 0 points 0 points 4 points 0 points 0 points  Total Score 0 points 0 points 4 points 0 points 0 points    Immunizations Immunization History  Administered Date(s) Administered   Fluad Quad(high Dose 65+) 03/31/2019, 04/28/2020, 02/23/2021   Influenza Split 03/11/2012   Influenza,inj,Quad PF,6+ Mos 04/02/2013, 05/17/2014, 06/29/2015, 06/27/2016, 02/25/2017, 03/04/2018   PFIZER(Purple Top)SARS-COV-2 Vaccination 08/07/2019, 08/28/2019, 04/21/2020, 11/16/2020, 05/17/2021   Pneumococcal Conjugate-13 01/04/2014   Pneumococcal Polysaccharide-23 09/25/2007   Td 09/25/2007   Zoster Recombinat (Shingrix) 10/12/2021   Zoster, Live 03/12/2012    TDAP status: Due, Education has been provided regarding the importance of this vaccine. Advised may receive this vaccine at local pharmacy or Health Dept. Aware to provide a copy of the vaccination record if obtained from local pharmacy or Health Dept. Verbalized acceptance and understanding.  Flu Vaccine status: Up to date  Pneumococcal vaccine status: Up to date  Covid-19 vaccine status: Completed vaccines  Qualifies for Shingles Vaccine? Yes   Zostavax completed Yes   Shingrix Completed?: No.    Education has been provided regarding the importance of this vaccine. Patient has been advised to call insurance company to determine out of pocket expense if they have not yet received this vaccine. Advised may also receive vaccine at local pharmacy or Health Dept. Verbalized acceptance and understanding.  Screening Tests Health Maintenance  Topic Date Due   COVID-19 Vaccine (6 - Booster for Pfizer series) 07/12/2021   Zoster Vaccines- Shingrix (2 of 2) 12/07/2021   TETANUS/TDAP  04/19/2022 (Originally 09/24/2017)   INFLUENZA VACCINE  01/30/2022   COLONOSCOPY (Pts 45-28yr Insurance coverage will need to  be confirmed)  03/22/2026   Pneumonia Vaccine 80 Years old  Completed   DEXA SCAN  Completed   Hepatitis C Screening  Completed   HPV VACCINES  Aged Out    Health Maintenance  Health Maintenance Due  Topic Date Due   COVID-19 Vaccine (6 - Booster for PDaconoseries) 07/12/2021   Zoster Vaccines- Shingrix (2 of 2) 12/07/2021    Colorectal  cancer screening: Type of screening: Colonoscopy. Completed 03/22/21. Repeat every 5 years  Mammogram status: Completed  . Repeat every year  Bone Density status: Completed 12/21/19. Results reflect: Bone density results: NORMAL. Repeat every 2 years.  Lung Cancer Screening: (Low Dose CT Chest recommended if Age 80-80 years, 30 pack-year currently smoking OR have quit w/in 15years.) does not qualify.   Lung Cancer Screening Referral: no  Additional Screening:  Hepatitis C Screening: does qualify; Completed 04/14/20  Vision Screening: Recommended annual ophthalmology exams for early detection of glaucoma and other disorders of the eye. Is the patient up to date with their annual eye exam?  Yes  Who is the provider or what is the name of the office in which the patient attends annual eye exams? Dr.Groat If pt is not established with a provider, would they like to be referred to a provider to establish care? No .   Dental Screening: Recommended annual dental exams for proper oral hygiene  Community Resource Referral / Chronic Care Management: CRR required this visit?  No   CCM required this visit?  No      Plan:     I have personally reviewed and noted the following in the patient's chart:   Medical and social history Use of alcohol, tobacco or illicit drugs  Current medications and supplements including opioid prescriptions.  Functional ability and status Nutritional status Physical activity Advanced directives List of other physicians Hospitalizations, surgeries, and ER visits in previous 12 months Vitals Screenings to include  cognitive, depression, and falls Referrals and appointments  In addition, I have reviewed and discussed with patient certain preventive protocols, quality metrics, and best practice recommendations. A written personalized care plan for preventive services as well as general preventive health recommendations were provided to patient.     Quentin Angst, [place]   12/11/2021

## 2021-12-11 NOTE — Telephone Encounter (Signed)
Pt called stating that she has a boil in between her legs & is wanting to know if she can please get something for this?    [facility]

## 2021-12-13 ENCOUNTER — Encounter: Payer: Self-pay | Admitting: Family Medicine

## 2021-12-13 ENCOUNTER — Ambulatory Visit (INDEPENDENT_AMBULATORY_CARE_PROVIDER_SITE_OTHER): Payer: Medicare Other | Admitting: Family Medicine

## 2021-12-13 ENCOUNTER — Telehealth: Payer: Self-pay

## 2021-12-13 VITALS — BP 153/68 | HR 66 | Resp 16 | Ht 61.0 in | Wt 154.8 lb

## 2021-12-13 DIAGNOSIS — I10 Essential (primary) hypertension: Secondary | ICD-10-CM | POA: Diagnosis not present

## 2021-12-13 DIAGNOSIS — L0292 Furuncle, unspecified: Secondary | ICD-10-CM | POA: Diagnosis not present

## 2021-12-13 MED ORDER — CEPHALEXIN 500 MG PO CAPS: 500.0000 mg | ORAL_CAPSULE | Freq: Two times a day (BID) | ORAL | 0 refills | Status: DC

## 2021-12-13 NOTE — Patient Instructions (Signed)
F/U as before, call if you need me sooner   5 DAY COURSE OF ANTIBIOTIC, KEFLEX IS PRESCRIBED  All the best with your upcoming tests  It is safe and good for you to go with your family on the upcoming trip, have fun!  Thanks for choosing [facility], we consider it a privelige to serve you.

## 2021-12-13 NOTE — Telephone Encounter (Signed)
Patient asked if Dr Moshe Cipro will order labs and she will come in 1 week before her physical in November to have them done.

## 2021-12-14 ENCOUNTER — Other Ambulatory Visit: Payer: Self-pay | Admitting: Family Medicine

## 2021-12-14 DIAGNOSIS — N183 Chronic kidney disease, stage 3 unspecified: Secondary | ICD-10-CM

## 2021-12-14 DIAGNOSIS — E785 Hyperlipidemia, unspecified: Secondary | ICD-10-CM

## 2021-12-14 DIAGNOSIS — I10 Essential (primary) hypertension: Secondary | ICD-10-CM

## 2021-12-14 NOTE — Telephone Encounter (Signed)
Do you want to order any labs for her to get the week before her next appt?

## 2021-12-15 ENCOUNTER — Other Ambulatory Visit: Payer: Self-pay | Admitting: *Deleted

## 2021-12-15 DIAGNOSIS — E038 Other specified hypothyroidism: Secondary | ICD-10-CM

## 2021-12-15 DIAGNOSIS — I10 Essential (primary) hypertension: Secondary | ICD-10-CM

## 2021-12-15 DIAGNOSIS — E785 Hyperlipidemia, unspecified: Secondary | ICD-10-CM

## 2021-12-15 NOTE — Telephone Encounter (Signed)
Pt advised labs were ordered with verbal understanding

## 2021-12-18 ENCOUNTER — Other Ambulatory Visit (HOSPITAL_COMMUNITY): Payer: Self-pay | Admitting: Nephrology

## 2021-12-18 ENCOUNTER — Other Ambulatory Visit: Payer: Self-pay | Admitting: Family Medicine

## 2021-12-18 ENCOUNTER — Other Ambulatory Visit (INDEPENDENT_AMBULATORY_CARE_PROVIDER_SITE_OTHER): Payer: Self-pay | Admitting: Internal Medicine

## 2021-12-18 DIAGNOSIS — I10 Essential (primary) hypertension: Secondary | ICD-10-CM

## 2021-12-19 ENCOUNTER — Encounter: Payer: Self-pay | Admitting: Family Medicine

## 2021-12-19 NOTE — Progress Notes (Signed)
   [name] [name]     MRN: [id number]      DOB: 1940-07-22   HPI [name] [name] is here c/o boil on left inner thigh which burst 2 days prior, wants it checked as she still has pain, and wants to be cleared to take a trip out of town for a wedding Denies fever or chills No known inciting trauma No oter concerns  ROS Denies recent fever or chills. Denies sinus pressure, nasal congestion, ear pain or sore throat. Denies chest congestion, productive cough or wheezing. Denies chest pains, palpitations and leg swelling Denies abdominal pain, nausea, vomiting,diarrhea or constipation.   Denies dysuria, frequency, hesitancy or incontinence. Denies uncontrolled joint pain, swelling and limitation in mobility. Denies headaches, seizures, numbness, or tingling. Denies depression, anxiety or insomnia.  PE  BP (!) 153/68   Pulse 66   Resp 16   Ht '5\' 1"'$  (1.549 m)   Wt 154 lb 12.8 oz (70.2 kg)   SpO2 92%   BMI 29.25 kg/m   Patient alert and oriented and in no cardiopulmonary distress.  HEENT: No facial asymmetry, EOMI,     Neck supple .  Chest: Clear to auscultation bilaterally.  CVS: S1, S2 no murmurs, no S3.Regular rate.  ABD: Soft non tender.   Ext: No edema  MS: Adequate  though educed ROM spine, shoulders, hips and knees.  Skin: Intact, tender erythema on left inner thigh with draining boil.  Psych: Good eye contact, normal affect. Memory intact not anxious or depressed appearing.  CNS: CN 2-12 intact, power,  normal throughout.no focal deficits noted.   Assessment & Plan  Boil Boil on left inner thigh, already draining but still a pocket of pus present, keflex x 5 days is prescribed  Essential hypertension, malignant DASH diet and commitment to daily physical activity for a minimum of 30 minutes discussed and encouraged, as a part of hypertension management. The importance of attaining a healthy weight is also discussed. 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 (Lbs) 154.8  154.8  154.8 154.8   BMI 29.25 kg/m2  29.25 kg/m2  29.25 kg/m2 29.25 kg/m2

## 2021-12-19 NOTE — Assessment & Plan Note (Signed)
DASH diet and commitment to daily physical activity for a minimum of 30 minutes discussed and encouraged, as a part of hypertension management. The importance of attaining a healthy weight is also discussed. 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 (Lbs) 154.8  154.8  154.8 154.8   BMI 29.25 kg/m2  29.25 kg/m2  29.25 kg/m2 29.25 kg/m2

## 2021-12-19 NOTE — Assessment & Plan Note (Signed)
Boil on left inner thigh, already draining but still a pocket of pus present, keflex x 5 days is prescribed

## 2021-12-20 ENCOUNTER — Ambulatory Visit (HOSPITAL_COMMUNITY)
Admission: RE | Admit: 2021-12-20 | Discharge: 2021-12-20 | Disposition: A | Discharge status: Home / Self Care | Payer: Medicare Other | Source: Ambulatory Visit | Attending: Nephrology | Admitting: Nephrology

## 2021-12-20 DIAGNOSIS — I10 Essential (primary) hypertension: Secondary | ICD-10-CM

## 2021-12-21 ENCOUNTER — Encounter (HOSPITAL_COMMUNITY)
Admission: RE | Admit: 2021-12-21 | Discharge: 2021-12-21 | Disposition: A | Discharge status: Home / Self Care | Payer: Medicare Other | Source: Ambulatory Visit | Attending: Nephrology | Admitting: Nephrology

## 2021-12-21 ENCOUNTER — Encounter (HOSPITAL_COMMUNITY): Payer: Self-pay

## 2021-12-21 VITALS — BP 127/66 | HR 68 | Temp 97.6°F | Resp 18 | Ht 61.0 in | Wt 154.8 lb

## 2021-12-21 DIAGNOSIS — D631 Anemia in chronic kidney disease: Secondary | ICD-10-CM | POA: Insufficient documentation

## 2021-12-21 DIAGNOSIS — N170 Acute kidney failure with tubular necrosis: Secondary | ICD-10-CM | POA: Insufficient documentation

## 2021-12-21 DIAGNOSIS — I5032 Chronic diastolic (congestive) heart failure: Secondary | ICD-10-CM | POA: Insufficient documentation

## 2021-12-21 DIAGNOSIS — R809 Proteinuria, unspecified: Secondary | ICD-10-CM | POA: Insufficient documentation

## 2021-12-21 DIAGNOSIS — N182 Chronic kidney disease, stage 2 (mild): Secondary | ICD-10-CM

## 2021-12-21 DIAGNOSIS — Z01812 Encounter for preprocedural laboratory examination: Secondary | ICD-10-CM | POA: Diagnosis not present

## 2021-12-21 DIAGNOSIS — E1122 Type 2 diabetes mellitus with diabetic chronic kidney disease: Secondary | ICD-10-CM | POA: Diagnosis not present

## 2021-12-21 DIAGNOSIS — N1832 Chronic kidney disease, stage 3b: Secondary | ICD-10-CM | POA: Insufficient documentation

## 2021-12-21 DIAGNOSIS — I129 Hypertensive chronic kidney disease with stage 1 through stage 4 chronic kidney disease, or unspecified chronic kidney disease: Secondary | ICD-10-CM | POA: Diagnosis not present

## 2021-12-21 DIAGNOSIS — N183 Chronic kidney disease, stage 3 unspecified: Secondary | ICD-10-CM

## 2021-12-21 LAB — RENAL FUNCTION PANEL
Albumin: 4.1 g/dL (ref 3.5–5.0)
Anion gap: 7 (ref 5–15)
BUN: 35 mg/dL — ABNORMAL HIGH (ref 8–23)
CO2: 19 mmol/L — ABNORMAL LOW (ref 22–32)
Calcium: 9.4 mg/dL (ref 8.9–10.3)
Chloride: 108 mmol/L (ref 98–111)
Creatinine, Ser: 1.52 mg/dL — ABNORMAL HIGH (ref 0.44–1.00)
GFR, Estimated: 35 mL/min — ABNORMAL LOW (ref >60)
Glucose, Bld: 86 mg/dL (ref 70–99)
Phosphorus: 3.2 mg/dL (ref 2.5–4.6)
Potassium: 4.3 mmol/L (ref 3.5–5.1)
Sodium: 134 mmol/L — ABNORMAL LOW (ref 135–145)

## 2021-12-21 LAB — PROTEIN / CREATININE RATIO, URINE
Creatinine, Urine: 31.04 mg/dL
Protein Creatinine Ratio: 0.9 mg/mg{Cre} — ABNORMAL HIGH (ref 0.00–0.15)
Total Protein, Urine: 28 mg/dL

## 2021-12-21 LAB — CBC WITH DIFFERENTIAL/PLATELET
Abs Immature Granulocytes: 0.02 10*3/uL (ref 0.00–0.07)
Basophils Absolute: 0.1 10*3/uL (ref 0.0–0.1)
Basophils Relative: 1 %
Eosinophils Absolute: 0.4 10*3/uL (ref 0.0–0.5)
Eosinophils Relative: 7 %
HCT: 31 % — ABNORMAL LOW (ref 36.0–46.0)
Hemoglobin: 10 g/dL — ABNORMAL LOW (ref 12.0–15.0)
Immature Granulocytes: 0 %
Lymphocytes Relative: 23 %
Lymphs Abs: 1.4 10*3/uL (ref 0.7–4.0)
MCH: 29.2 pg (ref 26.0–34.0)
MCHC: 32.3 g/dL (ref 30.0–36.0)
MCV: 90.4 fL (ref 80.0–100.0)
Monocytes Absolute: 0.5 10*3/uL (ref 0.1–1.0)
Monocytes Relative: 8 %
Neutro Abs: 3.6 10*3/uL (ref 1.7–7.7)
Neutrophils Relative %: 61 %
Platelets: 199 10*3/uL (ref 150–400)
RBC: 3.43 MIL/uL — ABNORMAL LOW (ref 3.87–5.11)
RDW: 14.5 % (ref 11.5–15.5)
WBC: 6 10*3/uL (ref 4.0–10.5)
nRBC: 0 % (ref 0.0–0.2)

## 2021-12-21 LAB — POCT HEMOGLOBIN-HEMACUE: Hemoglobin: 10.3 g/dL — ABNORMAL LOW (ref 12.0–15.0)

## 2021-12-21 MED ORDER — EPOETIN ALFA-EPBX 3000 UNIT/ML IJ SOLN: 3000.0000 [IU] | Freq: Once | INTRAMUSCULAR | Status: DC

## 2021-12-28 ENCOUNTER — Other Ambulatory Visit: Payer: Self-pay | Admitting: Family Medicine

## 2021-12-28 DIAGNOSIS — I10 Essential (primary) hypertension: Secondary | ICD-10-CM

## 2021-12-29 ENCOUNTER — Ambulatory Visit (HOSPITAL_COMMUNITY)
Admission: RE | Admit: 2021-12-29 | Discharge: 2021-12-29 | Disposition: A | Discharge status: Home / Self Care | Payer: Medicare Other | Source: Ambulatory Visit | Attending: Family Medicine | Admitting: Family Medicine

## 2021-12-29 DIAGNOSIS — I2510 Atherosclerotic heart disease of native coronary artery without angina pectoris: Secondary | ICD-10-CM

## 2021-12-29 DIAGNOSIS — Z1231 Encounter for screening mammogram for malignant neoplasm of breast: Secondary | ICD-10-CM | POA: Insufficient documentation

## 2021-12-29 DIAGNOSIS — I10 Essential (primary) hypertension: Secondary | ICD-10-CM | POA: Diagnosis not present

## 2021-12-30 NOTE — Progress Notes (Deleted)
Cardiology Office Note  Date: 12/30/2021   ID: [name] [name], [place] 04/13/1941, MRN [id number]  PCP:  Fayrene Helper, MD  Cardiologist:  Rozann Lesches, MD Electrophysiologist:  None   No chief complaint on file.   History of Present Illness: [name] [name] is a 81 y.o. female last seen in January.  I reviewed her interval lab work as noted below.  Past Medical History:  Diagnosis Date   CAD (coronary artery disease)    Multivessel status post CABG October 2005   Carotid artery disease ([place])    CKD (chronic kidney disease), stage III ([place])    Essential hypertension    Glaucoma    Laser surgery 2010   Hyperlipidemia    Hypothyroidism    Iron deficiency anemia    Obesity     Past Surgical History:  Procedure Laterality Date   BIOPSY  04/27/2021   Procedure: BIOPSY;  Surgeon: Milus Banister, MD;  [facility];  Service: Endoscopy;;   COLONOSCOPY N/A 02/04/2013   Procedure: COLONOSCOPY;  Surgeon: Rogene Houston, MD;  [facility];  Service: Endoscopy;  Laterality: N/A;  1015   COLONOSCOPY N/A 12/21/2016   Procedure: COLONOSCOPY;  Surgeon: Rogene Houston, MD;  [facility];  Service: Endoscopy;  Laterality: N/A;   COLONOSCOPY WITH PROPOFOL N/A 03/22/2021   Procedure: COLONOSCOPY WITH PROPOFOL;  Surgeon: Rogene Houston, MD;  [facility];  Service: Endoscopy;  Laterality: N/A;  8:15   CORONARY ARTERY BYPASS GRAFT  October 2005   Dr. Roxy Manns - LIMA to LAD, SVG to OM 2 and OM 3, SVG to PDA   ESOPHAGOGASTRODUODENOSCOPY N/A 12/21/2016   Procedure: ESOPHAGOGASTRODUODENOSCOPY (EGD);  Surgeon: Rogene Houston, MD;  [facility];  Service: Endoscopy;  Laterality: N/A;  11:10   ESOPHAGOGASTRODUODENOSCOPY N/A 02/27/2018   Procedure: ESOPHAGOGASTRODUODENOSCOPY (EGD);  Surgeon: Rogene Houston, MD;  [facility];  Service: Endoscopy;  Laterality: N/A;   ESOPHAGOGASTRODUODENOSCOPY (EGD) WITH PROPOFOL N/A  04/27/2021   Procedure: ESOPHAGOGASTRODUODENOSCOPY (EGD) WITH PROPOFOL;  Surgeon: Milus Banister, MD;  [facility];  Service: Endoscopy;  Laterality: N/A;   ESOPHAGOGASTRODUODENOSCOPY (EGD) WITH PROPOFOL N/A 08/17/2021   Procedure: ESOPHAGOGASTRODUODENOSCOPY (EGD) WITH PROPOFOL;  Surgeon: Rush Landmark Telford Nab., MD;  Location: [place];  Service: Gastroenterology;  Laterality: N/A;   EUS N/A 04/27/2021   Procedure: UPPER ENDOSCOPIC ULTRASOUND (EUS) RADIAL;  Surgeon: Milus Banister, MD;  [facility];  Service: Endoscopy;  Laterality: N/A;   EYE SURGERY  2010   laser treatment to both eyes for glaucoma   GIVENS CAPSULE STUDY N/A 03/07/2018   Procedure: GIVENS CAPSULE STUDY;  Surgeon: Rogene Houston, MD;  [facility];  Service: Endoscopy;  Laterality: N/A;   GIVENS CAPSULE STUDY N/A 02/05/2020   Procedure: GIVENS CAPSULE STUDY;  Surgeon: Rogene Houston, MD;  [facility];  Service: Endoscopy;  Laterality: N/A;  730   POLYPECTOMY  03/22/2021   Procedure: POLYPECTOMY;  Surgeon: Rogene Houston, MD;  [facility];  Service: Endoscopy;;   UPPER ESOPHAGEAL ENDOSCOPIC ULTRASOUND (EUS) N/A 08/17/2021   Procedure: UPPER ESOPHAGEAL ENDOSCOPIC ULTRASOUND (EUS);  Surgeon: Irving Copas., MD;  Location: [facility];  Service: Gastroenterology;  Laterality: N/A;    Current Outpatient Medications  Medication Sig Dispense Refill   amLODipine (NORVASC) 5 MG tablet Take one tablet by mouth twice daily for blood pressure 90 tablet 5   aspirin  81 MG tablet Take 1 tablet (81 mg total) by mouth daily. Restart on 03/02/18 30 tablet 0   cephALEXin (KEFLEX) 500 MG capsule Take 1 capsule (500 mg total) by mouth 2 (two) times daily. 10 capsule 0   cetirizine (ZYRTEC) 10 MG tablet TAKE ONE TABLET BY MOUTH DAILY 90 tablet 1   chlorthalidone (HYGROTON) 25 MG tablet Take by mouth.     ferrous sulfate 325 (65 FE) MG tablet Take 325 mg by mouth daily with  breakfast.     fish oil-omega-3 fatty acids 1000 MG capsule Take 1 g by mouth daily.     fluticasone (FLONASE) 50 MCG/ACT nasal spray USE 2 SPRAYS IN EACH NOSTRIL ONCE DAILY. SHAKE GENTLY BEFORE USING. (Patient taking differently: Place 1 spray into both nostrils daily as needed for allergies.) 48 g 1   hydrALAZINE (APRESOLINE) 100 MG tablet Take 100 mg by mouth 2 (two) times daily.     latanoprost (XALATAN) 0.005 % ophthalmic solution Place 1 drop into both eyes at bedtime.     levothyroxine (SYNTHROID) 100 MCG tablet TAKE ONE TABLET BY MOUTH DAILY 30 tablet 3   metoprolol succinate (TOPROL-XL) 100 MG 24 hr tablet TAKE ONE TABLET BY MOUTH DAILY. TAKE WITH OR IMMEDIATELY FOLLOWING A MEAL. 90 tablet 1   nitroGLYCERIN (NITROSTAT) 0.4 MG SL tablet DISSOLVE ONE TABLET UNDER TONGUE EVERY 5 MINUTES UP TO 3 DOSES AS NEEDED FOR CHEST PAIN 25 tablet 3   omeprazole (PRILOSEC) 20 MG capsule TAKE 1 CAPSULE BY MOUTH DAILY 90 capsule 3   Polyvinyl Alcohol-Povidone (REFRESH OP) Place 1 drop into both eyes at bedtime.     rosuvastatin (CRESTOR) 10 MG tablet TAKE ONE TABLET BY MOUTH DAILY. 90 tablet 3   SYSTANE ULTRA 0.4-0.3 % SOLN Place 1 drop into both eyes daily as needed (dry eyes).     trolamine salicylate (ASPERCREME) 10 % cream Apply 1 application topically as needed for muscle pain.     UNABLE TO FIND Shower chair x 1  DX M54.36 1 each 0   No current facility-administered medications for this visit.   Allergies:  Tramadol, Lisinopril, Shrimp (diagnostic), and Clonidine derivatives   Social History: The patient  reports that she quit smoking about 18 years ago. Her smoking use included cigarettes. She has a 10.00 pack-year smoking history. She has never used smokeless tobacco. She reports that she does not drink alcohol and does not use drugs.   Family History: The patient's family history includes Aneurysm in her mother; Breast cancer (age of onset: 76) in her sister; COPD in her sister; Colon cancer  (age of onset: 65) in her brother; Coronary artery disease in her father, mother, and sister; Diabetes in her sister, sister, and son; Diverticulosis in her sister; Hearing loss in her sister; Heart Problems in her sister; Hypertension in her mother and sister; Kidney disease in her sister; Liver cancer (age of onset: 40) in her brother; Pancreatic cancer in her son.   ROS:  Please see the history of present illness. Otherwise, complete review of systems is positive for {NONE DEFAULTED:18576}.  All other systems are reviewed and negative.   Physical Exam: VS:  There were no vitals taken for this visit., BMI There is no height or weight on file to calculate BMI.  Wt Readings from Last 3 Encounters:  12/21/21 154 lb 12.8 oz (70.2 kg)  12/13/21 154 lb 12.8 oz (70.2 kg)  11/07/21 154 lb 12.8 oz (70.2 kg)    General: Patient appears  comfortable at rest. HEENT: Conjunctiva and lids normal, oropharynx clear with moist mucosa. Neck: Supple, no elevated JVP or carotid bruits, no thyromegaly. Lungs: Clear to auscultation, nonlabored breathing at rest. Cardiac: Regular rate and rhythm, no S3 or significant systolic murmur, no pericardial rub. Abdomen: Soft, nontender, no hepatomegaly, bowel sounds present, no guarding or rebound. Extremities: No pitting edema, distal pulses 2+. Skin: Warm and dry. Musculoskeletal: No kyphosis. Neuropsychiatric: Alert and oriented x3, affect grossly appropriate.  ECG:  An ECG dated 03/15/2021 was personally reviewed today and demonstrated:  Sinus rhythm with nonspecific ST-T changes.  Recent Labwork: 10/05/2021: ALT 11; AST 19; TSH 0.496 12/21/2021: BUN 35; Creatinine, Ser 1.52; Hemoglobin 10.3; Platelets 199; Potassium 4.3; Sodium 134     Component Value Date/Time   CHOL 137 10/05/2021 0930   TRIG 77 10/05/2021 0930   HDL 61 10/05/2021 0930   CHOLHDL 2.2 10/05/2021 0930   CHOLHDL 2.5 06/15/2021 1230   VLDL 22 06/15/2021 1230   LDLCALC 61 10/05/2021 0930    LDLCALC 61 07/21/2019 0938    Other Studies Reviewed Today:  Echocardiogram 10/25/2020:  1. Left ventricular ejection fraction, by estimation, is 65 to 70%. The  left ventricle has normal function. Left ventricular endocardial border  not optimally defined to evaluate regional wall motion. There is mild left  ventricular hypertrophy. Left  ventricular diastolic parameters are consistent with Grade II diastolic  dysfunction (pseudonormalization). Elevated left atrial pressure. The  average left ventricular global longitudinal strain is 18.6 %. The global  longitudinal strain is normal.   2. Right ventricular systolic function is normal. The right ventricular  size is normal.   3. Left atrial size was severely dilated.   4. Right atrial size was severely dilated.   5. The mitral valve is normal in structure. Trivial mitral valve  regurgitation. No evidence of mitral stenosis.   6. Tricuspid valve regurgitation is moderate.   7. The aortic valve is tricuspid. Aortic valve regurgitation is mild.  Mild aortic valve stenosis. Aortic valve mean gradient measures 12.0 mmHg.  Aortic valve peak gradient measures 28.1 mmHg. Aortic valve area, by VTI  measures 1.71 cm.   8. Moderate to severe pulmonary HTN, PASP is 60 mmHg.   9. The inferior vena cava is normal in size with greater than 50%  respiratory variability, suggesting right atrial pressure of 3 mmHg.   Assessment and Plan:    Medication Adjustments/Labs and Tests Ordered: Current medicines are reviewed at length with the patient today.  Concerns regarding medicines are outlined above.   Tests Ordered: No orders of the defined types were placed in this encounter.   Medication Changes: No orders of the defined types were placed in this encounter.   Disposition:  Follow up {follow up:15908}  Signed, Satira Sark, MD, [facility] 12/30/2021 2:24 PM    [facility] at [place], [place], [place]  [address] Phone: [phone]; Fax: [phone]

## 2022-01-01 ENCOUNTER — Ambulatory Visit: Payer: Medicare Other | Admitting: Cardiology

## 2022-01-01 DIAGNOSIS — I25119 Atherosclerotic heart disease of native coronary artery with unspecified angina pectoris: Secondary | ICD-10-CM

## 2022-01-03 DIAGNOSIS — N170 Acute kidney failure with tubular necrosis: Secondary | ICD-10-CM | POA: Diagnosis not present

## 2022-01-03 DIAGNOSIS — D631 Anemia in chronic kidney disease: Secondary | ICD-10-CM | POA: Diagnosis not present

## 2022-01-03 DIAGNOSIS — I129 Hypertensive chronic kidney disease with stage 1 through stage 4 chronic kidney disease, or unspecified chronic kidney disease: Secondary | ICD-10-CM | POA: Diagnosis not present

## 2022-01-03 DIAGNOSIS — N1832 Chronic kidney disease, stage 3b: Secondary | ICD-10-CM | POA: Diagnosis not present

## 2022-01-03 DIAGNOSIS — I5032 Chronic diastolic (congestive) heart failure: Secondary | ICD-10-CM | POA: Diagnosis not present

## 2022-01-03 DIAGNOSIS — N189 Chronic kidney disease, unspecified: Secondary | ICD-10-CM | POA: Diagnosis not present

## 2022-01-03 DIAGNOSIS — E871 Hypo-osmolality and hyponatremia: Secondary | ICD-10-CM | POA: Diagnosis not present

## 2022-01-03 DIAGNOSIS — R809 Proteinuria, unspecified: Secondary | ICD-10-CM | POA: Diagnosis not present

## 2022-01-04 ENCOUNTER — Encounter (HOSPITAL_COMMUNITY)
Admission: RE | Admit: 2022-01-04 | Discharge: 2022-01-04 | Disposition: A | Discharge status: Home / Self Care | Payer: Medicare Other | Source: Ambulatory Visit | Attending: Nephrology | Admitting: Nephrology

## 2022-01-04 VITALS — BP 140/60 | HR 63 | Temp 97.7°F | Ht 61.0 in | Wt 154.8 lb

## 2022-01-04 DIAGNOSIS — D631 Anemia in chronic kidney disease: Secondary | ICD-10-CM | POA: Diagnosis not present

## 2022-01-04 DIAGNOSIS — N1831 Chronic kidney disease, stage 3a: Secondary | ICD-10-CM

## 2022-01-04 DIAGNOSIS — N1832 Chronic kidney disease, stage 3b: Secondary | ICD-10-CM | POA: Diagnosis not present

## 2022-01-04 LAB — POCT HEMOGLOBIN-HEMACUE: Hemoglobin: 9.7 g/dL — ABNORMAL LOW (ref 12.0–15.0)

## 2022-01-04 MED ORDER — EPOETIN ALFA-EPBX 3000 UNIT/ML IJ SOLN
3000.0000 [IU] | Freq: Once | INTRAMUSCULAR | Status: AC
  Administered 2022-01-04: 3000 [IU] via SUBCUTANEOUS

## 2022-01-04 MED ORDER — EPOETIN ALFA-EPBX 3000 UNIT/ML IJ SOLN
INTRAMUSCULAR | Status: AC
  Filled 2022-01-04: qty 1

## 2022-01-15 ENCOUNTER — Encounter: Payer: Self-pay | Admitting: Cardiology

## 2022-01-15 ENCOUNTER — Ambulatory Visit: Payer: Medicare Other | Admitting: Cardiology

## 2022-01-15 VITALS — BP 132/70 | HR 81 | Ht 61.0 in | Wt 154.0 lb

## 2022-01-15 DIAGNOSIS — I350 Nonrheumatic aortic (valve) stenosis: Secondary | ICD-10-CM

## 2022-01-15 DIAGNOSIS — I6523 Occlusion and stenosis of bilateral carotid arteries: Secondary | ICD-10-CM | POA: Diagnosis not present

## 2022-01-15 DIAGNOSIS — I25119 Atherosclerotic heart disease of native coronary artery with unspecified angina pectoris: Secondary | ICD-10-CM | POA: Diagnosis not present

## 2022-01-15 DIAGNOSIS — N1832 Chronic kidney disease, stage 3b: Secondary | ICD-10-CM

## 2022-01-15 NOTE — Progress Notes (Signed)
Cardiology Office Note  Date: 01/15/2022   ID: [name], [name] 01/18/1941, MRN [id number]  PCP:  Fayrene Helper, MD  Cardiologist:  Rozann Lesches, MD Electrophysiologist:  None   Chief Complaint  Patient presents with   Cardiac follow-up    History of Present Illness: [name] [name] is a 81 y.o. female last seen in January.  She is here for a routine visit.  She does not describe any interval chest pain or nitroglycerin use.  Still has good support from family.  She does not describe any other major health changes.  Also continues to follow with nephrology, I reviewed her most recent lab work and her medications as noted below.  She had follow-up carotid Dopplers in March with moderate bilateral ICA stenosis, right greater than left.  She remains asymptomatic and is on aspirin as well as Crestor with most recent LDL 61.  Echocardiogram from April 2022 showed mild calcific aortic stenosis with mean gradient 12 mmHg.  Past Medical History:  Diagnosis Date   CAD (coronary artery disease)    Multivessel status post CABG October 2005   Carotid artery disease ([place])    CKD (chronic kidney disease), stage III ([place])    Essential hypertension    Glaucoma    Laser surgery 2010   Hyperlipidemia    Hypothyroidism    Iron deficiency anemia    Obesity     Past Surgical History:  Procedure Laterality Date   BIOPSY  04/27/2021   Procedure: BIOPSY;  Surgeon: Milus Banister, MD;  [facility];  Service: Endoscopy;;   COLONOSCOPY N/A 02/04/2013   Procedure: COLONOSCOPY;  Surgeon: Rogene Houston, MD;  [facility];  Service: Endoscopy;  Laterality: N/A;  1015   COLONOSCOPY N/A 12/21/2016   Procedure: COLONOSCOPY;  Surgeon: Rogene Houston, MD;  [facility];  Service: Endoscopy;  Laterality: N/A;   COLONOSCOPY WITH PROPOFOL N/A 03/22/2021   Procedure: COLONOSCOPY WITH PROPOFOL;  Surgeon: Rogene Houston, MD;  [facility];   Service: Endoscopy;  Laterality: N/A;  8:15   CORONARY ARTERY BYPASS GRAFT  October 2005   Dr. Roxy Manns - LIMA to LAD, SVG to OM 2 and OM 3, SVG to PDA   ESOPHAGOGASTRODUODENOSCOPY N/A 12/21/2016   Procedure: ESOPHAGOGASTRODUODENOSCOPY (EGD);  Surgeon: Rogene Houston, MD;  [facility];  Service: Endoscopy;  Laterality: N/A;  11:10   ESOPHAGOGASTRODUODENOSCOPY N/A 02/27/2018   Procedure: ESOPHAGOGASTRODUODENOSCOPY (EGD);  Surgeon: Rogene Houston, MD;  [facility];  Service: Endoscopy;  Laterality: N/A;   ESOPHAGOGASTRODUODENOSCOPY (EGD) WITH PROPOFOL N/A 04/27/2021   Procedure: ESOPHAGOGASTRODUODENOSCOPY (EGD) WITH PROPOFOL;  Surgeon: Milus Banister, MD;  [facility];  Service: Endoscopy;  Laterality: N/A;   ESOPHAGOGASTRODUODENOSCOPY (EGD) WITH PROPOFOL N/A 08/17/2021   Procedure: ESOPHAGOGASTRODUODENOSCOPY (EGD) WITH PROPOFOL;  Surgeon: Rush Landmark Telford Nab., MD;  Location: [place];  Service: Gastroenterology;  Laterality: N/A;   EUS N/A 04/27/2021   Procedure: UPPER ENDOSCOPIC ULTRASOUND (EUS) RADIAL;  Surgeon: Milus Banister, MD;  [facility];  Service: Endoscopy;  Laterality: N/A;   EYE SURGERY  2010   laser treatment to both eyes for glaucoma   GIVENS CAPSULE STUDY N/A 03/07/2018   Procedure: GIVENS CAPSULE STUDY;  Surgeon: Rogene Houston, MD;  [facility];  Service: Endoscopy;  Laterality: N/A;   GIVENS CAPSULE STUDY N/A 02/05/2020   Procedure: GIVENS CAPSULE STUDY;  Surgeon: Rogene Houston, MD;  [facility];  Service: Endoscopy;  Laterality: N/A;  730   POLYPECTOMY  03/22/2021   Procedure: POLYPECTOMY;  Surgeon: Rogene Houston, MD;  [facility];  Service: Endoscopy;;   UPPER ESOPHAGEAL ENDOSCOPIC ULTRASOUND (EUS) N/A 08/17/2021   Procedure: UPPER ESOPHAGEAL ENDOSCOPIC ULTRASOUND (EUS);  Surgeon: Irving Copas., MD;  Location: [place];  Service: Gastroenterology;  Laterality: N/A;     Current Outpatient Medications  Medication Sig Dispense Refill   amLODipine (NORVASC) 5 MG tablet Take one tablet by mouth twice daily for blood pressure 90 tablet 5   aspirin 81 MG tablet Take 1 tablet (81 mg total) by mouth daily. Restart on 03/02/18 30 tablet 0   cetirizine (ZYRTEC) 10 MG tablet TAKE ONE TABLET BY MOUTH DAILY 90 tablet 1   chlorthalidone (HYGROTON) 25 MG tablet Take by mouth.     ferrous sulfate 325 (65 FE) MG tablet Take 325 mg by mouth daily with breakfast.     fish oil-omega-3 fatty acids 1000 MG capsule Take 1 g by mouth daily.     fluticasone (FLONASE) 50 MCG/ACT nasal spray USE 2 SPRAYS IN EACH NOSTRIL ONCE DAILY. SHAKE GENTLY BEFORE USING. (Patient taking differently: Place 1 spray into both nostrils daily as needed for allergies.) 48 g 1   hydrALAZINE (APRESOLINE) 100 MG tablet Take 100 mg by mouth 2 (two) times daily.     latanoprost (XALATAN) 0.005 % ophthalmic solution Place 1 drop into both eyes at bedtime.     levothyroxine (SYNTHROID) 100 MCG tablet TAKE ONE TABLET BY MOUTH DAILY 30 tablet 3   metoprolol succinate (TOPROL-XL) 100 MG 24 hr tablet TAKE ONE TABLET BY MOUTH DAILY. TAKE WITH OR IMMEDIATELY FOLLOWING A MEAL. 90 tablet 1   nitroGLYCERIN (NITROSTAT) 0.4 MG SL tablet DISSOLVE ONE TABLET UNDER TONGUE EVERY 5 MINUTES UP TO 3 DOSES AS NEEDED FOR CHEST PAIN 25 tablet 3   Polyvinyl Alcohol-Povidone (REFRESH OP) Place 1 drop into both eyes at bedtime.     rosuvastatin (CRESTOR) 10 MG tablet TAKE ONE TABLET BY MOUTH DAILY. 90 tablet 3   SYSTANE ULTRA 0.4-0.3 % SOLN Place 1 drop into both eyes daily as needed (dry eyes).     trolamine salicylate (ASPERCREME) 10 % cream Apply 1 application topically as needed for muscle pain.     UNABLE TO FIND Shower chair x 1  DX M54.36 1 each 0   omeprazole (PRILOSEC) 20 MG capsule TAKE 1 CAPSULE BY MOUTH DAILY (Patient not taking: Reported on 01/15/2022) 90 capsule 3   No current facility-administered medications for this  visit.   Allergies:  Tramadol, Lisinopril, Shrimp (diagnostic), and Clonidine derivatives   ROS: No orthopnea or PND.  Using a cane, no falls.  Physical Exam: VS:  BP 132/70 (BP Location: Left Arm, Patient Position: Sitting, Cuff Size: Normal)   Pulse 81   Ht '5\' 1"'$  (1.549 m)   Wt 154 lb (69.9 kg)   SpO2 98%   BMI 29.10 kg/m , BMI Body mass index is 29.1 kg/m.  Wt Readings from Last 3 Encounters:  01/15/22 154 lb (69.9 kg)  01/04/22 154 lb 12.8 oz (70.2 kg)  12/21/21 154 lb 12.8 oz (70.2 kg)    General: Patient appears comfortable at rest. HEENT: Conjunctiva and lids normal, oropharynx clear. Neck: Supple, no elevated JVP or carotid bruits, no thyromegaly. Lungs: Clear to auscultation, nonlabored breathing at rest. Cardiac: Regular rate and rhythm, no S3, 2/6 systolic murmur, no pericardial rub. Extremities: No  pitting edema.  ECG:  An ECG dated 03/15/2021 was personally reviewed today and demonstrated:  Sinus rhythm with nonspecific ST-T changes.  Recent Labwork: 10/05/2021: ALT 11; AST 19; TSH 0.496 12/21/2021: BUN 35; Creatinine, Ser 1.52; Platelets 199; Potassium 4.3; Sodium 134 01/04/2022: Hemoglobin 9.7     Component Value Date/Time   CHOL 137 10/05/2021 0930   TRIG 77 10/05/2021 0930   HDL 61 10/05/2021 0930   CHOLHDL 2.2 10/05/2021 0930   CHOLHDL 2.5 06/15/2021 1230   VLDL 22 06/15/2021 1230   LDLCALC 61 10/05/2021 0930   LDLCALC 61 07/21/2019 0938    Other Studies Reviewed Today:  Echocardiogram 10/25/2020:  1. Left ventricular ejection fraction, by estimation, is 65 to 70%. The  left ventricle has normal function. Left ventricular endocardial border  not optimally defined to evaluate regional wall motion. There is mild left  ventricular hypertrophy. Left  ventricular diastolic parameters are consistent with Grade II diastolic  dysfunction (pseudonormalization). Elevated left atrial pressure. The  average left ventricular global longitudinal strain is 18.6 %.  The global  longitudinal strain is normal.   2. Right ventricular systolic function is normal. The right ventricular  size is normal.   3. Left atrial size was severely dilated.   4. Right atrial size was severely dilated.   5. The mitral valve is normal in structure. Trivial mitral valve  regurgitation. No evidence of mitral stenosis.   6. Tricuspid valve regurgitation is moderate.   7. The aortic valve is tricuspid. Aortic valve regurgitation is mild.  Mild aortic valve stenosis. Aortic valve mean gradient measures 12.0 mmHg.  Aortic valve peak gradient measures 28.1 mmHg. Aortic valve area, by VTI  measures 1.71 cm.   8. Moderate to severe pulmonary HTN, PASP is 60 mmHg.   9. The inferior vena cava is normal in size with greater than 50%  respiratory variability, suggesting right atrial pressure of 3 mmHg.   Carotid Dopplers 09/28/2021: Summary:  Right Carotid: Velocities in the right ICA are consistent with a 60-79%                 stenosis.   Left Carotid: Velocities in the left ICA are consistent with a 40-59%  stenosis.                The ECA appears >50% stenosed.   Vertebrals:  Bilateral vertebral arteries demonstrate antegrade flow.  Subclavians: Left subclavian artery was stenotic. Right subclavian artery  flow               was disturbed.   Renal artery Dopplers 12/20/2021: Summary:  Renal:     Right: Normal size right kidney. No visualized stenosis, limited         visualization. Two cysts visualized with the largest         measuring approximately 2.75 x 2.87 cm.  Left:  Normal size of left kidney. No visualized stenosis limited         visualization.            NOTE: This was somewhat of a tehnically difficult exam,         further imaging may be warranted.   Assessment and Plan:  1.  Multivessel CAD status post CABG in 2005.  She does not report any angina or nitroglycerin use.  Continue aspirin, Norvasc, Toprol-XL, and Crestor.  2.  Essential  hypertension, systolic is in the 983J today.  Continue Norvasc, chlorthalidone, hydralazine, and Toprol-XL.  3.  CKD stage  IIIb, creatinine 1.5 to.  She is following with Dr. Theador Hawthorne.  4.  Asymptomatic carotid artery disease as outlined above.  Plan on follow-up carotid Dopplers in March of next year.  Continue aspirin and Crestor.  5.  Mild calcific aortic stenosis, asymptomatic.  Medication Adjustments/Labs and Tests Ordered: Current medicines are reviewed at length with the patient today.  Concerns regarding medicines are outlined above.   Tests Ordered: No orders of the defined types were placed in this encounter.   Medication Changes: No orders of the defined types were placed in this encounter.   Disposition:  Follow up  6 months.  Signed, Satira Sark, MD, [facility] 01/15/2022 11:41 AM    [place] at [place], [place], [place] [address] Phone: [phone]; Fax: [phone]

## 2022-01-15 NOTE — Patient Instructions (Addendum)

## 2022-01-18 ENCOUNTER — Encounter (HOSPITAL_COMMUNITY): Payer: Self-pay

## 2022-01-18 ENCOUNTER — Encounter (HOSPITAL_COMMUNITY)
Admission: RE | Admit: 2022-01-18 | Discharge: 2022-01-18 | Disposition: A | Discharge status: Home / Self Care | Payer: Medicare Other | Source: Ambulatory Visit | Attending: Nephrology | Admitting: Nephrology

## 2022-01-18 VITALS — BP 145/70 | HR 67 | Temp 97.8°F | Resp 18 | Ht 61.0 in | Wt 154.0 lb

## 2022-01-18 DIAGNOSIS — N1832 Chronic kidney disease, stage 3b: Secondary | ICD-10-CM

## 2022-01-18 DIAGNOSIS — D631 Anemia in chronic kidney disease: Secondary | ICD-10-CM | POA: Diagnosis not present

## 2022-01-18 LAB — POCT HEMOGLOBIN-HEMACUE: Hemoglobin: 10.3 g/dL — ABNORMAL LOW (ref 12.0–15.0)

## 2022-01-18 MED ORDER — EPOETIN ALFA-EPBX 3000 UNIT/ML IJ SOLN: 3000.0000 [IU] | Freq: Once | INTRAMUSCULAR | Status: DC

## 2022-02-01 ENCOUNTER — Encounter (HOSPITAL_COMMUNITY): Admission: RE | Admit: 2022-02-01 | Payer: Medicare Other | Source: Ambulatory Visit

## 2022-02-01 DIAGNOSIS — N1831 Chronic kidney disease, stage 3a: Secondary | ICD-10-CM

## 2022-02-06 ENCOUNTER — Encounter (HOSPITAL_COMMUNITY)
Admission: RE | Admit: 2022-02-06 | Discharge: 2022-02-06 | Disposition: A | Discharge status: Home / Self Care | Payer: Medicare Other | Source: Ambulatory Visit | Attending: Nephrology | Admitting: Nephrology

## 2022-02-06 VITALS — HR 64 | Temp 97.7°F | Resp 18

## 2022-02-06 DIAGNOSIS — N1832 Chronic kidney disease, stage 3b: Secondary | ICD-10-CM | POA: Diagnosis not present

## 2022-02-06 LAB — POCT HEMOGLOBIN-HEMACUE: Hemoglobin: 10.3 g/dL — ABNORMAL LOW (ref 12.0–15.0)

## 2022-02-14 ENCOUNTER — Encounter: Payer: Self-pay | Admitting: *Deleted

## 2022-02-14 NOTE — Chronic Care Management (AMB) (Signed)
   02/14/2022  Blanch P Mandelbaum Oct 11, 1940 [id number]   Error

## 2022-02-19 ENCOUNTER — Encounter: Payer: Self-pay | Admitting: *Deleted

## 2022-02-19 NOTE — Chronic Care Management (AMB) (Signed)
   02/19/2022  Leyli P Buerkle [birth year] [id number]   ERROR

## 2022-02-20 ENCOUNTER — Encounter (HOSPITAL_COMMUNITY)
Admission: RE | Admit: 2022-02-20 | Discharge: 2022-02-20 | Disposition: A | Discharge status: Home / Self Care | Payer: Medicare Other | Source: Ambulatory Visit | Attending: Nephrology | Admitting: Nephrology

## 2022-02-20 VITALS — BP 143/54 | HR 108 | Temp 98.0°F | Resp 18

## 2022-02-20 DIAGNOSIS — N1832 Chronic kidney disease, stage 3b: Secondary | ICD-10-CM

## 2022-02-20 LAB — CBC WITH DIFFERENTIAL/PLATELET
Abs Immature Granulocytes: 0.01 10*3/uL (ref 0.00–0.07)
Basophils Absolute: 0 10*3/uL (ref 0.0–0.1)
Basophils Relative: 1 %
Eosinophils Absolute: 0.3 10*3/uL (ref 0.0–0.5)
Eosinophils Relative: 6 %
HCT: 30.9 % — ABNORMAL LOW (ref 36.0–46.0)
Hemoglobin: 9.9 g/dL — ABNORMAL LOW (ref 12.0–15.0)
Immature Granulocytes: 0 %
Lymphocytes Relative: 20 %
Lymphs Abs: 1 10*3/uL (ref 0.7–4.0)
MCH: 29.1 pg (ref 26.0–34.0)
MCHC: 32 g/dL (ref 30.0–36.0)
MCV: 90.9 fL (ref 80.0–100.0)
Monocytes Absolute: 0.5 10*3/uL (ref 0.1–1.0)
Monocytes Relative: 9 %
Neutro Abs: 3.3 10*3/uL (ref 1.7–7.7)
Neutrophils Relative %: 64 %
Platelets: 217 10*3/uL (ref 150–400)
RBC: 3.4 MIL/uL — ABNORMAL LOW (ref 3.87–5.11)
RDW: 13.7 % (ref 11.5–15.5)
WBC: 5.2 10*3/uL (ref 4.0–10.5)
nRBC: 0 % (ref 0.0–0.2)

## 2022-02-20 LAB — PROTEIN / CREATININE RATIO, URINE
Creatinine, Urine: 39.73 mg/dL
Protein Creatinine Ratio: 1.41 mg/mg{Cre} — ABNORMAL HIGH (ref 0.00–0.15)
Total Protein, Urine: 56 mg/dL

## 2022-02-20 LAB — RENAL FUNCTION PANEL
Albumin: 3.6 g/dL (ref 3.5–5.0)
Anion gap: 6 (ref 5–15)
BUN: 28 mg/dL — ABNORMAL HIGH (ref 8–23)
CO2: 22 mmol/L (ref 22–32)
Calcium: 9.4 mg/dL (ref 8.9–10.3)
Chloride: 107 mmol/L (ref 98–111)
Creatinine, Ser: 1.48 mg/dL — ABNORMAL HIGH (ref 0.44–1.00)
GFR, Estimated: 36 mL/min — ABNORMAL LOW (ref >60)
Glucose, Bld: 83 mg/dL (ref 70–99)
Phosphorus: 3.2 mg/dL (ref 2.5–4.6)
Potassium: 4 mmol/L (ref 3.5–5.1)
Sodium: 135 mmol/L (ref 135–145)

## 2022-02-20 LAB — POCT HEMOGLOBIN-HEMACUE: Hemoglobin: 10 g/dL — ABNORMAL LOW (ref 12.0–15.0)

## 2022-02-20 MED ORDER — EPOETIN ALFA-EPBX 3000 UNIT/ML IJ SOLN: 3000.0000 [IU] | Freq: Once | INTRAMUSCULAR | Status: DC

## 2022-02-22 ENCOUNTER — Other Ambulatory Visit: Payer: Self-pay | Admitting: Family Medicine

## 2022-02-27 ENCOUNTER — Ambulatory Visit: Payer: Self-pay | Admitting: *Deleted

## 2022-02-27 ENCOUNTER — Telehealth: Payer: Medicare Other

## 2022-02-27 ENCOUNTER — Encounter: Payer: Self-pay | Admitting: *Deleted

## 2022-02-27 DIAGNOSIS — I10 Essential (primary) hypertension: Secondary | ICD-10-CM

## 2022-02-27 DIAGNOSIS — I2510 Atherosclerotic heart disease of native coronary artery without angina pectoris: Secondary | ICD-10-CM

## 2022-02-27 NOTE — Chronic Care Management (AMB) (Signed)
   02/27/2022  [name] [name] 1940-07-03 [id number]   Care plan previously updated and resolved due to unable to maintain contact.  Jacqlyn Larsen [facility], BSN RN Case Manager [facility] [phone]

## 2022-03-06 ENCOUNTER — Encounter (HOSPITAL_COMMUNITY)
Admission: RE | Admit: 2022-03-06 | Discharge: 2022-03-06 | Disposition: A | Discharge status: Home / Self Care | Payer: Medicare Other | Source: Ambulatory Visit | Attending: Nephrology | Admitting: Nephrology

## 2022-03-06 ENCOUNTER — Encounter (HOSPITAL_COMMUNITY): Payer: Self-pay

## 2022-03-06 VITALS — BP 154/60 | HR 50 | Temp 98.0°F | Resp 20

## 2022-03-06 DIAGNOSIS — N1832 Chronic kidney disease, stage 3b: Secondary | ICD-10-CM | POA: Diagnosis not present

## 2022-03-06 LAB — POCT HEMOGLOBIN-HEMACUE: Hemoglobin: 11 g/dL — ABNORMAL LOW (ref 12.0–15.0)

## 2022-03-20 ENCOUNTER — Encounter (HOSPITAL_COMMUNITY): Payer: Self-pay

## 2022-03-20 ENCOUNTER — Encounter (HOSPITAL_COMMUNITY)
Admission: RE | Admit: 2022-03-20 | Discharge: 2022-03-20 | Disposition: A | Discharge status: Home / Self Care | Payer: Medicare Other | Source: Ambulatory Visit | Attending: Nephrology | Admitting: Nephrology

## 2022-03-20 DIAGNOSIS — N1832 Chronic kidney disease, stage 3b: Secondary | ICD-10-CM | POA: Diagnosis not present

## 2022-03-20 LAB — POCT HEMOGLOBIN-HEMACUE: Hemoglobin: 10.2 g/dL — ABNORMAL LOW (ref 12.0–15.0)

## 2022-03-20 MED ORDER — EPOETIN ALFA-EPBX 3000 UNIT/ML IJ SOLN: 3000.0000 [IU] | Freq: Once | INTRAMUSCULAR | Status: DC

## 2022-03-27 ENCOUNTER — Other Ambulatory Visit: Payer: Self-pay | Admitting: Internal Medicine

## 2022-03-29 DIAGNOSIS — N184 Chronic kidney disease, stage 4 (severe): Secondary | ICD-10-CM | POA: Diagnosis not present

## 2022-03-29 DIAGNOSIS — I5032 Chronic diastolic (congestive) heart failure: Secondary | ICD-10-CM | POA: Diagnosis not present

## 2022-03-29 DIAGNOSIS — N189 Chronic kidney disease, unspecified: Secondary | ICD-10-CM | POA: Diagnosis not present

## 2022-03-29 DIAGNOSIS — I129 Hypertensive chronic kidney disease with stage 1 through stage 4 chronic kidney disease, or unspecified chronic kidney disease: Secondary | ICD-10-CM | POA: Diagnosis not present

## 2022-03-29 DIAGNOSIS — D631 Anemia in chronic kidney disease: Secondary | ICD-10-CM | POA: Diagnosis not present

## 2022-03-29 DIAGNOSIS — R809 Proteinuria, unspecified: Secondary | ICD-10-CM | POA: Diagnosis not present

## 2022-04-03 ENCOUNTER — Encounter (HOSPITAL_COMMUNITY)
Admission: RE | Admit: 2022-04-03 | Discharge: 2022-04-03 | Disposition: A | Discharge status: Home / Self Care | Payer: Medicare Other | Source: Ambulatory Visit | Attending: Nephrology | Admitting: Nephrology

## 2022-04-03 DIAGNOSIS — D631 Anemia in chronic kidney disease: Secondary | ICD-10-CM | POA: Insufficient documentation

## 2022-04-03 DIAGNOSIS — N1832 Chronic kidney disease, stage 3b: Secondary | ICD-10-CM | POA: Insufficient documentation

## 2022-04-12 ENCOUNTER — Encounter (HOSPITAL_COMMUNITY)
Admission: RE | Admit: 2022-04-12 | Discharge: 2022-04-12 | Disposition: A | Discharge status: Home / Self Care | Payer: Medicare Other | Source: Ambulatory Visit | Attending: Nephrology | Admitting: Nephrology

## 2022-04-12 DIAGNOSIS — N1832 Chronic kidney disease, stage 3b: Secondary | ICD-10-CM | POA: Diagnosis not present

## 2022-04-12 DIAGNOSIS — D631 Anemia in chronic kidney disease: Secondary | ICD-10-CM | POA: Diagnosis not present

## 2022-04-12 LAB — RENAL FUNCTION PANEL
Albumin: 3.8 g/dL (ref 3.5–5.0)
Anion gap: 6 (ref 5–15)
BUN: 30 mg/dL — ABNORMAL HIGH (ref 8–23)
CO2: 22 mmol/L (ref 22–32)
Calcium: 9.8 mg/dL (ref 8.9–10.3)
Chloride: 110 mmol/L (ref 98–111)
Creatinine, Ser: 1.5 mg/dL — ABNORMAL HIGH (ref 0.44–1.00)
GFR, Estimated: 35 mL/min — ABNORMAL LOW (ref >60)
Glucose, Bld: 91 mg/dL (ref 70–99)
Phosphorus: 3.6 mg/dL (ref 2.5–4.6)
Potassium: 4.6 mmol/L (ref 3.5–5.1)
Sodium: 138 mmol/L (ref 135–145)

## 2022-04-12 LAB — POCT HEMOGLOBIN-HEMACUE: Hemoglobin: 9.7 g/dL — ABNORMAL LOW (ref 12.0–15.0)

## 2022-04-12 MED ORDER — EPOETIN ALFA-EPBX 3000 UNIT/ML IJ SOLN
INTRAMUSCULAR | Status: AC
  Administered 2022-04-12: 3000 [IU] via SUBCUTANEOUS
  Filled 2022-04-12: qty 1

## 2022-04-12 MED ORDER — EPOETIN ALFA-EPBX 3000 UNIT/ML IJ SOLN: 3000.0000 [IU] | Freq: Once | INTRAMUSCULAR | Status: AC

## 2022-04-24 DIAGNOSIS — Z23 Encounter for immunization: Secondary | ICD-10-CM | POA: Diagnosis not present

## 2022-04-25 ENCOUNTER — Other Ambulatory Visit: Payer: Self-pay | Admitting: Family Medicine

## 2022-04-26 ENCOUNTER — Encounter (HOSPITAL_COMMUNITY): Payer: Self-pay

## 2022-04-26 ENCOUNTER — Encounter (HOSPITAL_COMMUNITY)
Admission: RE | Admit: 2022-04-26 | Discharge: 2022-04-26 | Disposition: A | Discharge status: Home / Self Care | Payer: Medicare Other | Source: Ambulatory Visit | Attending: Family Medicine | Admitting: Family Medicine

## 2022-04-26 VITALS — BP 137/62 | HR 71 | Temp 97.9°F | Resp 16 | Ht 61.0 in | Wt 153.3 lb

## 2022-04-26 DIAGNOSIS — N1832 Chronic kidney disease, stage 3b: Secondary | ICD-10-CM

## 2022-04-26 DIAGNOSIS — D631 Anemia in chronic kidney disease: Secondary | ICD-10-CM | POA: Diagnosis not present

## 2022-04-26 LAB — POCT HEMOGLOBIN-HEMACUE: Hemoglobin: 10.3 g/dL — ABNORMAL LOW (ref 12.0–15.0)

## 2022-04-26 MED ORDER — EPOETIN ALFA-EPBX 3000 UNIT/ML IJ SOLN: 3000.0000 [IU] | Freq: Once | INTRAMUSCULAR | Status: DC

## 2022-05-07 ENCOUNTER — Other Ambulatory Visit: Payer: Self-pay

## 2022-05-07 DIAGNOSIS — N1832 Chronic kidney disease, stage 3b: Secondary | ICD-10-CM

## 2022-05-09 ENCOUNTER — Encounter (HOSPITAL_COMMUNITY)
Admission: RE | Admit: 2022-05-09 | Discharge: 2022-05-09 | Disposition: A | Discharge status: Home / Self Care | Payer: Medicare Other | Source: Ambulatory Visit | Attending: Nephrology | Admitting: Nephrology

## 2022-05-09 ENCOUNTER — Other Ambulatory Visit (HOSPITAL_COMMUNITY)
Admission: RE | Admit: 2022-05-09 | Discharge: 2022-05-09 | Disposition: A | Discharge status: Home / Self Care | Payer: Medicare Other | Attending: *Deleted | Admitting: *Deleted

## 2022-05-09 VITALS — BP 148/58 | HR 66 | Temp 97.8°F | Resp 18

## 2022-05-09 DIAGNOSIS — N1832 Chronic kidney disease, stage 3b: Secondary | ICD-10-CM | POA: Insufficient documentation

## 2022-05-09 DIAGNOSIS — D631 Anemia in chronic kidney disease: Secondary | ICD-10-CM | POA: Diagnosis not present

## 2022-05-09 DIAGNOSIS — N183 Chronic kidney disease, stage 3 unspecified: Secondary | ICD-10-CM | POA: Insufficient documentation

## 2022-05-09 DIAGNOSIS — E785 Hyperlipidemia, unspecified: Secondary | ICD-10-CM | POA: Insufficient documentation

## 2022-05-09 LAB — COMPREHENSIVE METABOLIC PANEL
ALT: 16 U/L (ref 0–44)
AST: 23 U/L (ref 15–41)
Albumin: 3.8 g/dL (ref 3.5–5.0)
Alkaline Phosphatase: 79 U/L (ref 38–126)
Anion gap: 8 (ref 5–15)
BUN: 29 mg/dL — ABNORMAL HIGH (ref 8–23)
CO2: 19 mmol/L — ABNORMAL LOW (ref 22–32)
Calcium: 9.9 mg/dL (ref 8.9–10.3)
Chloride: 113 mmol/L — ABNORMAL HIGH (ref 98–111)
Creatinine, Ser: 1.37 mg/dL — ABNORMAL HIGH (ref 0.44–1.00)
GFR, Estimated: 39 mL/min — ABNORMAL LOW (ref >60)
Glucose, Bld: 77 mg/dL (ref 70–99)
Potassium: 4.3 mmol/L (ref 3.5–5.1)
Sodium: 140 mmol/L (ref 135–145)
Total Bilirubin: 0.7 mg/dL (ref 0.3–1.2)
Total Protein: 7.7 g/dL (ref 6.5–8.1)

## 2022-05-09 LAB — LIPID PANEL
Cholesterol: 127 mg/dL (ref 0–200)
HDL: 56 mg/dL (ref >40)
LDL Cholesterol: 59 mg/dL (ref 0–99)
Total CHOL/HDL Ratio: 2.3 RATIO
Triglycerides: 62 mg/dL (ref <150)
VLDL: 12 mg/dL (ref 0–40)

## 2022-05-09 LAB — TSH: TSH: 2.408 u[IU]/mL (ref 0.350–4.500)

## 2022-05-09 MED ORDER — EPOETIN ALFA-EPBX 3000 UNIT/ML IJ SOLN
INTRAMUSCULAR | Status: AC
  Filled 2022-05-09: qty 1

## 2022-05-09 MED ORDER — EPOETIN ALFA-EPBX 3000 UNIT/ML IJ SOLN
3000.0000 [IU] | Freq: Once | INTRAMUSCULAR | Status: AC
  Administered 2022-05-09: 3000 [IU] via SUBCUTANEOUS

## 2022-05-09 NOTE — Progress Notes (Signed)
Diagnosis: Anemia in Chronic Kidney Disease  Provider:  Manpreet Bhutani MD  Procedure: Injection  Retacrit (epoetin alfa-epbx), Dose: 3000 Units, Site: subcutaneous, Number of injections: 1  Post Care: Patient declined observation  Hemoglobin 9.8  Discharge: Condition: Good, Destination: Home . AVS provided to patient.   Performed by:  Binnie Kand, RN

## 2022-05-10 ENCOUNTER — Encounter (HOSPITAL_COMMUNITY): Admission: RE | Admit: 2022-05-10 | Payer: Medicare Other | Source: Ambulatory Visit

## 2022-05-10 LAB — POCT HEMOGLOBIN-HEMACUE: Hemoglobin: 9.8 g/dL — ABNORMAL LOW (ref 12.0–15.0)

## 2022-05-15 ENCOUNTER — Ambulatory Visit (INDEPENDENT_AMBULATORY_CARE_PROVIDER_SITE_OTHER): Payer: Medicare Other | Admitting: Family Medicine

## 2022-05-15 ENCOUNTER — Encounter: Payer: Self-pay | Admitting: Family Medicine

## 2022-05-15 VITALS — BP 129/58 | HR 68 | Ht 61.0 in | Wt 151.1 lb

## 2022-05-15 DIAGNOSIS — Z0000 Encounter for general adult medical examination without abnormal findings: Secondary | ICD-10-CM

## 2022-05-15 DIAGNOSIS — Z23 Encounter for immunization: Secondary | ICD-10-CM

## 2022-05-15 MED ORDER — LEVOTHYROXINE SODIUM 100 MCG PO TABS: 100.0000 ug | ORAL_TABLET | Freq: Every day | ORAL | 1 refills | Status: DC

## 2022-05-15 MED ORDER — HYDRALAZINE HCL 100 MG PO TABS: ORAL_TABLET | ORAL | 3 refills | Status: DC

## 2022-05-15 MED ORDER — UNABLE TO FIND: 0 refills | Status: AC

## 2022-05-15 NOTE — Patient Instructions (Addendum)
F/U in 5.5 months, call if you need me sooner  Excellent labs and blood pressure is good  Nurse please print and give rx for TdAP  Careful not to fall  Thanks for choosing [facility], we consider it a privelige to serve you.  Fasting lipid, cmp and EGFR, TSH and Vit D  5 to 7  days before next visit

## 2022-05-15 NOTE — Progress Notes (Signed)
    [name] [name]     MRN: [id number]      DOB: 14-Feb-1941  HPI: Patient is in for annual physical exam. No other health concerns are expressed or addressed at the visit. Recent labs,  are reviewed. Immunization is reviewed , and  updated if needed.   PE: BP (!) 129/58 (BP Location: Right Arm, Patient Position: Sitting, Cuff Size: Normal)   Pulse 68   Ht '5\' 1"'$  (1.549 m)   Wt 151 lb 1.9 oz (68.5 kg)   SpO2 93%   BMI 28.55 kg/m   Pleasant  female, alert and oriented x 3, in no cardio-pulmonary distress. Afebrile. HEENT No facial trauma or asymetry. Sinuses non tender.  Extra occullar muscles intact.. External ears normal, . Neck: supple, no adenopathy,JVD or thyromegaly.No bruits.  Chest: Clear to ascultation bilaterally.No crackles or wheezes. Non tender to palpation   Cardiovascular system; Heart sounds normal,  S1 and  S2 ,no S3.   Murmur present , no  thrill. Apical beat not displaced Peripheral pulses normal.  Abdomen: Soft, non tender,     Musculoskeletal exam: Decreased though adequate ROM of spine, hips , shoulders and knees. No deformity ,swelling or crepitus noted. No muscle wasting or atrophy.   Neurologic: Cranial nerves 2 to 12 intact. Power, tone ,sensation and reflexes normal throughout. No disturbance in gait. No tremor.  Skin: Intact, no ulceration, erythema , scaling or rash noted. Pigmentation normal throughout  Psych; Normal mood and affect. Judgement and concentration normal   Assessment & Plan:  Encounter for annual physical exam Annual exam as documented. Counseling done  re healthy lifestyle involving commitment to 150 minutes exercise per week, heart healthy diet, and attaining healthy weight.The importance of adequate sleep also discussed. Regular seat belt use and home safety, is also discussed. Changes in health habits are decided on by the patient with goals and time frames  set for achieving them. Immunization and cancer  screening needs are specifically addressed at this visit.

## 2022-05-15 NOTE — Assessment & Plan Note (Signed)

## 2022-05-22 ENCOUNTER — Encounter (HOSPITAL_COMMUNITY)
Admission: RE | Admit: 2022-05-22 | Discharge: 2022-05-22 | Disposition: A | Discharge status: Home / Self Care | Payer: Medicare Other | Source: Ambulatory Visit | Attending: Family Medicine | Admitting: Family Medicine

## 2022-05-22 VITALS — BP 133/68 | HR 75 | Temp 97.8°F | Resp 18

## 2022-05-22 DIAGNOSIS — E785 Hyperlipidemia, unspecified: Secondary | ICD-10-CM | POA: Diagnosis not present

## 2022-05-22 DIAGNOSIS — D631 Anemia in chronic kidney disease: Secondary | ICD-10-CM | POA: Diagnosis not present

## 2022-05-22 DIAGNOSIS — N1832 Chronic kidney disease, stage 3b: Secondary | ICD-10-CM | POA: Diagnosis not present

## 2022-05-22 MED ORDER — EPOETIN ALFA-EPBX 3000 UNIT/ML IJ SOLN
3000.0000 [IU] | Freq: Once | INTRAMUSCULAR | Status: AC
  Administered 2022-05-22: 3000 [IU] via SUBCUTANEOUS
  Filled 2022-05-22: qty 1

## 2022-05-22 NOTE — Progress Notes (Signed)
Diagnosis: Anemia in Chronic Kidney Disease  Provider:  Tula Nakayama MD  Procedure: Injection  Retacrit (epoetin alfa-epbx), Dose: 3000 Units, Site: subcutaneous, Number of injections: 1  HGB 9.8  Post Care: Patient declined observation  Discharge: Condition: Good, Destination: Home . AVS provided to patient.   Performed by:  Baxter Hire, RN

## 2022-05-23 ENCOUNTER — Encounter (HOSPITAL_COMMUNITY): Admission: RE | Admit: 2022-05-23 | Payer: Medicare Other | Source: Ambulatory Visit

## 2022-06-06 ENCOUNTER — Encounter (HOSPITAL_COMMUNITY)
Admission: RE | Admit: 2022-06-06 | Discharge: 2022-06-06 | Disposition: A | Discharge status: Home / Self Care | Payer: Medicare Other | Source: Ambulatory Visit | Attending: Nephrology | Admitting: Nephrology

## 2022-06-06 VITALS — BP 132/75 | HR 74 | Temp 97.5°F | Resp 18

## 2022-06-06 DIAGNOSIS — D631 Anemia in chronic kidney disease: Secondary | ICD-10-CM | POA: Diagnosis not present

## 2022-06-06 DIAGNOSIS — N1832 Chronic kidney disease, stage 3b: Secondary | ICD-10-CM | POA: Insufficient documentation

## 2022-06-06 DIAGNOSIS — N183 Chronic kidney disease, stage 3 unspecified: Secondary | ICD-10-CM

## 2022-06-06 LAB — CBC WITH DIFFERENTIAL/PLATELET
Abs Immature Granulocytes: 0.01 10*3/uL (ref 0.00–0.07)
Basophils Absolute: 0.1 10*3/uL (ref 0.0–0.1)
Basophils Relative: 1 %
Eosinophils Absolute: 0.3 10*3/uL (ref 0.0–0.5)
Eosinophils Relative: 7 %
HCT: 29.6 % — ABNORMAL LOW (ref 36.0–46.0)
Hemoglobin: 9.4 g/dL — ABNORMAL LOW (ref 12.0–15.0)
Immature Granulocytes: 0 %
Lymphocytes Relative: 24 %
Lymphs Abs: 1.3 10*3/uL (ref 0.7–4.0)
MCH: 29.6 pg (ref 26.0–34.0)
MCHC: 31.8 g/dL (ref 30.0–36.0)
MCV: 93.1 fL (ref 80.0–100.0)
Monocytes Absolute: 0.5 10*3/uL (ref 0.1–1.0)
Monocytes Relative: 9 %
Neutro Abs: 3 10*3/uL (ref 1.7–7.7)
Neutrophils Relative %: 59 %
Platelets: 179 10*3/uL (ref 150–400)
RBC: 3.18 MIL/uL — ABNORMAL LOW (ref 3.87–5.11)
RDW: 14.6 % (ref 11.5–15.5)
WBC: 5.2 10*3/uL (ref 4.0–10.5)
nRBC: 0 % (ref 0.0–0.2)

## 2022-06-06 LAB — RENAL FUNCTION PANEL
Albumin: 3.8 g/dL (ref 3.5–5.0)
Anion gap: 7 (ref 5–15)
BUN: 39 mg/dL — ABNORMAL HIGH (ref 8–23)
CO2: 19 mmol/L — ABNORMAL LOW (ref 22–32)
Calcium: 9.6 mg/dL (ref 8.9–10.3)
Chloride: 110 mmol/L (ref 98–111)
Creatinine, Ser: 1.5 mg/dL — ABNORMAL HIGH (ref 0.44–1.00)
GFR, Estimated: 35 mL/min — ABNORMAL LOW (ref >60)
Glucose, Bld: 92 mg/dL (ref 70–99)
Phosphorus: 3.4 mg/dL (ref 2.5–4.6)
Potassium: 4.4 mmol/L (ref 3.5–5.1)
Sodium: 136 mmol/L (ref 135–145)

## 2022-06-06 LAB — IRON AND TIBC
Iron: 133 ug/dL (ref 28–170)
Saturation Ratios: 32 % — ABNORMAL HIGH (ref 10.4–31.8)
TIBC: 416 ug/dL (ref 250–450)
UIBC: 283 ug/dL

## 2022-06-06 LAB — PROTEIN / CREATININE RATIO, URINE
Creatinine, Urine: 61.13 mg/dL
Protein Creatinine Ratio: 0.79 mg/mg{Cre} — ABNORMAL HIGH (ref 0.00–0.15)
Total Protein, Urine: 48 mg/dL

## 2022-06-06 LAB — POCT HEMOGLOBIN-HEMACUE: Hemoglobin: 9.6 g/dL — ABNORMAL LOW (ref 12.0–15.0)

## 2022-06-06 LAB — FERRITIN: Ferritin: 13 ng/mL (ref 11–307)

## 2022-06-06 MED ORDER — EPOETIN ALFA-EPBX 3000 UNIT/ML IJ SOLN
3000.0000 [IU] | Freq: Once | INTRAMUSCULAR | Status: AC
  Administered 2022-06-06: 3000 [IU] via SUBCUTANEOUS
  Filled 2022-06-06: qty 1

## 2022-06-06 NOTE — Progress Notes (Signed)
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  9.6  Post Care: Observation period completed  Discharge: Condition: Good, Destination: Home . AVS provided to patient.   Performed by:  Oren Beckmann, RN

## 2022-06-12 ENCOUNTER — Other Ambulatory Visit: Payer: Self-pay | Admitting: Family Medicine

## 2022-06-14 DIAGNOSIS — N1832 Chronic kidney disease, stage 3b: Secondary | ICD-10-CM | POA: Diagnosis not present

## 2022-06-14 DIAGNOSIS — I129 Hypertensive chronic kidney disease with stage 1 through stage 4 chronic kidney disease, or unspecified chronic kidney disease: Secondary | ICD-10-CM | POA: Diagnosis not present

## 2022-06-14 DIAGNOSIS — R809 Proteinuria, unspecified: Secondary | ICD-10-CM | POA: Diagnosis not present

## 2022-06-14 DIAGNOSIS — D631 Anemia in chronic kidney disease: Secondary | ICD-10-CM | POA: Diagnosis not present

## 2022-06-14 DIAGNOSIS — E8722 Chronic metabolic acidosis: Secondary | ICD-10-CM | POA: Diagnosis not present

## 2022-06-14 DIAGNOSIS — I5032 Chronic diastolic (congestive) heart failure: Secondary | ICD-10-CM | POA: Diagnosis not present

## 2022-06-14 DIAGNOSIS — N189 Chronic kidney disease, unspecified: Secondary | ICD-10-CM | POA: Diagnosis not present

## 2022-06-20 ENCOUNTER — Encounter (HOSPITAL_COMMUNITY)
Admission: RE | Admit: 2022-06-20 | Discharge: 2022-06-20 | Disposition: A | Discharge status: Home / Self Care | Payer: Medicare Other | Source: Ambulatory Visit | Attending: Family Medicine | Admitting: Family Medicine

## 2022-06-20 VITALS — BP 133/64 | HR 71 | Temp 97.7°F | Resp 16

## 2022-06-20 DIAGNOSIS — N1832 Chronic kidney disease, stage 3b: Secondary | ICD-10-CM | POA: Diagnosis not present

## 2022-06-20 DIAGNOSIS — D631 Anemia in chronic kidney disease: Secondary | ICD-10-CM | POA: Diagnosis not present

## 2022-06-20 LAB — POCT HEMOGLOBIN-HEMACUE: Hemoglobin: 9.8 g/dL — ABNORMAL LOW (ref 12.0–15.0)

## 2022-06-20 MED ORDER — EPOETIN ALFA-EPBX 3000 UNIT/ML IJ SOLN
3000.0000 [IU] | Freq: Once | INTRAMUSCULAR | Status: AC
  Administered 2022-06-20: 3000 [IU] via SUBCUTANEOUS
  Filled 2022-06-20: qty 1

## 2022-06-20 NOTE — Progress Notes (Signed)
Diagnosis: Anemia in Chronic Kidney Disease  Provider:  Manpreet Bhutani MD  Procedure: Injection  Xolair (Omalizumab), Retacrit (epoetin alfa-epbx), Dose: 3000 Units, Site: subcutaneous, Number of injections: 1  HGB 9.8  Post Care: Patient declined observation  Discharge: Condition: Good, Destination: Home . AVS provided to patient.   Performed by:  Baxter Hire, RN

## 2022-07-04 ENCOUNTER — Encounter (HOSPITAL_COMMUNITY)
Admission: RE | Admit: 2022-07-04 | Discharge: 2022-07-04 | Disposition: A | Discharge status: Home / Self Care | Payer: Medicare Other | Source: Ambulatory Visit | Attending: Nephrology | Admitting: Nephrology

## 2022-07-04 VITALS — BP 137/66 | HR 81 | Temp 97.7°F | Resp 16

## 2022-07-04 DIAGNOSIS — D631 Anemia in chronic kidney disease: Secondary | ICD-10-CM | POA: Insufficient documentation

## 2022-07-04 DIAGNOSIS — N1832 Chronic kidney disease, stage 3b: Secondary | ICD-10-CM | POA: Insufficient documentation

## 2022-07-04 LAB — POCT HEMOGLOBIN-HEMACUE: Hemoglobin: 9.6 g/dL — ABNORMAL LOW (ref 12.0–15.0)

## 2022-07-04 MED ORDER — EPOETIN ALFA-EPBX 3000 UNIT/ML IJ SOLN
3000.0000 [IU] | Freq: Once | INTRAMUSCULAR | Status: AC
  Administered 2022-07-04: 3000 [IU] via SUBCUTANEOUS

## 2022-07-04 NOTE — Addendum Note (Signed)
Encounter addended by: Baxter Hire, RN on: 07/04/2022 1:33 PM  Actions taken: Therapy plan modified

## 2022-07-04 NOTE — Progress Notes (Signed)
Diagnosis: Anemia in Chronic Kidney Disease  Provider:  Tula Nakayama MD  Procedure: Injection  Retacrit (epoetin alfa-epbx), Dose: 3000 Units, Site: subcutaneous, Number of injections: 1  Post Care: Observation period completed  Discharge: Condition: Good, Destination: Home . AVS provided to patient.   Performed by:  Hughie Closs, RN

## 2022-07-18 ENCOUNTER — Ambulatory Visit: Payer: Medicare Other | Admitting: Cardiology

## 2022-07-18 ENCOUNTER — Other Ambulatory Visit: Payer: Self-pay | Admitting: *Deleted

## 2022-07-18 ENCOUNTER — Encounter (HOSPITAL_COMMUNITY): Payer: Medicare Other

## 2022-07-18 DIAGNOSIS — I6523 Occlusion and stenosis of bilateral carotid arteries: Secondary | ICD-10-CM

## 2022-07-19 ENCOUNTER — Encounter (HOSPITAL_COMMUNITY)
Admission: RE | Admit: 2022-07-19 | Discharge: 2022-07-19 | Disposition: A | Discharge status: Home / Self Care | Payer: Medicare Other | Source: Ambulatory Visit | Attending: Family Medicine | Admitting: Family Medicine

## 2022-07-19 VITALS — BP 151/58 | HR 63 | Temp 97.6°F | Resp 16

## 2022-07-19 DIAGNOSIS — N1832 Chronic kidney disease, stage 3b: Secondary | ICD-10-CM

## 2022-07-19 DIAGNOSIS — D631 Anemia in chronic kidney disease: Secondary | ICD-10-CM | POA: Diagnosis not present

## 2022-07-19 LAB — POCT HEMOGLOBIN-HEMACUE: Hemoglobin: 11.9 g/dL — ABNORMAL LOW (ref 12.0–15.0)

## 2022-07-19 MED ORDER — EPOETIN ALFA-EPBX 3000 UNIT/ML IJ SOLN: 3000.0000 [IU] | Freq: Once | INTRAMUSCULAR | Status: DC

## 2022-07-19 NOTE — Progress Notes (Signed)
Hgb 11.9 - no injection needed

## 2022-07-19 NOTE — Addendum Note (Signed)
Encounter addended by: Baxter Hire, RN on: 07/19/2022 1:41 PM  Actions taken: Therapy plan modified

## 2022-08-01 ENCOUNTER — Encounter (HOSPITAL_COMMUNITY)
Admission: RE | Admit: 2022-08-01 | Discharge: 2022-08-01 | Disposition: A | Discharge status: Home / Self Care | Payer: Medicare Other | Source: Ambulatory Visit | Attending: Family Medicine | Admitting: Family Medicine

## 2022-08-01 VITALS — BP 144/83 | HR 71 | Temp 97.6°F | Resp 16

## 2022-08-01 DIAGNOSIS — N1832 Chronic kidney disease, stage 3b: Secondary | ICD-10-CM | POA: Diagnosis not present

## 2022-08-01 DIAGNOSIS — D631 Anemia in chronic kidney disease: Secondary | ICD-10-CM | POA: Diagnosis not present

## 2022-08-01 LAB — POCT HEMOGLOBIN-HEMACUE: Hemoglobin: 9.7 g/dL — ABNORMAL LOW (ref 12.0–15.0)

## 2022-08-01 MED ORDER — EPOETIN ALFA-EPBX 3000 UNIT/ML IJ SOLN
3000.0000 [IU] | Freq: Once | INTRAMUSCULAR | Status: AC
  Administered 2022-08-01: 3000 [IU] via SUBCUTANEOUS

## 2022-08-01 NOTE — Progress Notes (Signed)
Diagnosis: Anemia in Chronic Kidney Disease  Provider:  Manpreet Bhutani MD  Procedure: Injection  Retacrit (epoetin alfa-epbx), Dose: 3000 Units, Site: subcutaneous, Number of injections: 1  Hgb 9.7   Post Care: Patient declined observation  Discharge: Condition: Good, Destination: Home . AVS provided to patient.   Performed by:  Baxter Hire, RN

## 2022-08-07 ENCOUNTER — Ambulatory Visit: Payer: Medicare Other | Admitting: Nurse Practitioner

## 2022-08-15 ENCOUNTER — Encounter (HOSPITAL_COMMUNITY): Payer: Medicare Other

## 2022-08-20 ENCOUNTER — Encounter (HOSPITAL_COMMUNITY)
Admission: RE | Admit: 2022-08-20 | Discharge: 2022-08-20 | Disposition: A | Discharge status: Home / Self Care | Payer: Medicare Other | Source: Ambulatory Visit | Attending: Nephrology | Admitting: Nephrology

## 2022-08-20 VITALS — BP 128/63 | HR 74 | Temp 98.6°F | Resp 14

## 2022-08-20 DIAGNOSIS — N1832 Chronic kidney disease, stage 3b: Secondary | ICD-10-CM | POA: Insufficient documentation

## 2022-08-20 DIAGNOSIS — D631 Anemia in chronic kidney disease: Secondary | ICD-10-CM | POA: Insufficient documentation

## 2022-08-20 LAB — POCT HEMOGLOBIN-HEMACUE: Hemoglobin: 9.5 g/dL — ABNORMAL LOW (ref 12.0–15.0)

## 2022-08-20 MED ORDER — EPOETIN ALFA-EPBX 3000 UNIT/ML IJ SOLN
3000.0000 [IU] | Freq: Once | INTRAMUSCULAR | Status: AC
  Administered 2022-08-20: 3000 [IU] via SUBCUTANEOUS

## 2022-08-20 NOTE — Progress Notes (Signed)
Diagnosis: Anemia in Chronic Kidney Disease  Provider:  Manpreet Bhutani MD  Procedure: Injection  Retacrit (epoetin alfa-epbx), Dose: 3000 Units, Site: subcutaneous, Number of injections: 1  Hgb 9.5  Post Care: Patient declined observation  Discharge: Condition: Good, Destination: Home . AVS Provided  Performed by:  Binnie Kand, RN

## 2022-08-20 NOTE — Addendum Note (Signed)
Encounter addended by: Baxter Hire, RN on: 08/20/2022 2:37 PM  Actions taken: Therapy plan modified

## 2022-08-21 ENCOUNTER — Other Ambulatory Visit: Payer: Self-pay | Admitting: Family Medicine

## 2022-08-22 ENCOUNTER — Ambulatory Visit: Payer: Medicare Other | Attending: Cardiology

## 2022-08-22 DIAGNOSIS — I6523 Occlusion and stenosis of bilateral carotid arteries: Secondary | ICD-10-CM

## 2022-08-23 ENCOUNTER — Ambulatory Visit: Payer: Medicare Other | Admitting: Nurse Practitioner

## 2022-08-29 ENCOUNTER — Encounter (HOSPITAL_COMMUNITY): Payer: Medicare Other

## 2022-09-05 ENCOUNTER — Encounter (HOSPITAL_COMMUNITY)
Admission: RE | Admit: 2022-09-05 | Discharge: 2022-09-05 | Disposition: A | Discharge status: Home / Self Care | Payer: Medicare Other | Source: Ambulatory Visit | Attending: Nephrology | Admitting: Nephrology

## 2022-09-05 VITALS — BP 153/66 | HR 61 | Temp 97.6°F | Resp 19

## 2022-09-05 DIAGNOSIS — D631 Anemia in chronic kidney disease: Secondary | ICD-10-CM | POA: Insufficient documentation

## 2022-09-05 DIAGNOSIS — N1832 Chronic kidney disease, stage 3b: Secondary | ICD-10-CM | POA: Insufficient documentation

## 2022-09-05 LAB — RENAL FUNCTION PANEL
Albumin: 3.6 g/dL (ref 3.5–5.0)
Anion gap: 6 (ref 5–15)
BUN: 39 mg/dL — ABNORMAL HIGH (ref 8–23)
CO2: 20 mmol/L — ABNORMAL LOW (ref 22–32)
Calcium: 9.5 mg/dL (ref 8.9–10.3)
Chloride: 108 mmol/L (ref 98–111)
Creatinine, Ser: 1.49 mg/dL — ABNORMAL HIGH (ref 0.44–1.00)
GFR, Estimated: 35 mL/min — ABNORMAL LOW (ref >60)
Glucose, Bld: 95 mg/dL (ref 70–99)
Phosphorus: 3.6 mg/dL (ref 2.5–4.6)
Potassium: 4.2 mmol/L (ref 3.5–5.1)
Sodium: 134 mmol/L — ABNORMAL LOW (ref 135–145)

## 2022-09-05 LAB — CBC WITH DIFFERENTIAL/PLATELET
Abs Immature Granulocytes: 0.01 10*3/uL (ref 0.00–0.07)
Basophils Absolute: 0 10*3/uL (ref 0.0–0.1)
Basophils Relative: 1 %
Eosinophils Absolute: 0.5 10*3/uL (ref 0.0–0.5)
Eosinophils Relative: 9 %
HCT: 28.5 % — ABNORMAL LOW (ref 36.0–46.0)
Hemoglobin: 8.9 g/dL — ABNORMAL LOW (ref 12.0–15.0)
Immature Granulocytes: 0 %
Lymphocytes Relative: 18 %
Lymphs Abs: 1 10*3/uL (ref 0.7–4.0)
MCH: 29.8 pg (ref 26.0–34.0)
MCHC: 31.2 g/dL (ref 30.0–36.0)
MCV: 95.3 fL (ref 80.0–100.0)
Monocytes Absolute: 0.6 10*3/uL (ref 0.1–1.0)
Monocytes Relative: 10 %
Neutro Abs: 3.4 10*3/uL (ref 1.7–7.7)
Neutrophils Relative %: 62 %
Platelets: 232 10*3/uL (ref 150–400)
RBC: 2.99 MIL/uL — ABNORMAL LOW (ref 3.87–5.11)
RDW: 13.6 % (ref 11.5–15.5)
WBC: 5.6 10*3/uL (ref 4.0–10.5)
nRBC: 0 % (ref 0.0–0.2)

## 2022-09-05 LAB — PROTEIN / CREATININE RATIO, URINE
Creatinine, Urine: 48 mg/dL
Protein Creatinine Ratio: 0.42 mg/mg{Cre} — ABNORMAL HIGH (ref 0.00–0.15)
Total Protein, Urine: 20 mg/dL

## 2022-09-05 LAB — POCT HEMOGLOBIN-HEMACUE: Hemoglobin: 8.5 g/dL — ABNORMAL LOW (ref 12.0–15.0)

## 2022-09-05 MED ORDER — EPOETIN ALFA-EPBX 3000 UNIT/ML IJ SOLN
3000.0000 [IU] | Freq: Once | INTRAMUSCULAR | Status: AC
  Administered 2022-09-05: 3000 [IU] via SUBCUTANEOUS

## 2022-09-05 NOTE — Progress Notes (Signed)
Diagnosis: Anemia in Chronic Kidney Disease  Provider:  Manpreet Bhutani MD  Procedure: Injection  Retacrit (epoetin alfa-epbx), Dose: 3000 Units, Site: subcutaneous, Number of injections: 1  Post Care: Patient declined observation  Discharge: Condition: Good, Destination: Home . AVS Provided  Performed by:  Kennith Maes, RN       Injection given. Hemoglobin 8.5

## 2022-09-06 ENCOUNTER — Ambulatory Visit: Payer: Medicare Other | Admitting: Nurse Practitioner

## 2022-09-12 ENCOUNTER — Encounter (HOSPITAL_COMMUNITY): Payer: Medicare Other

## 2022-09-17 ENCOUNTER — Other Ambulatory Visit: Payer: Self-pay | Admitting: Internal Medicine

## 2022-09-17 ENCOUNTER — Other Ambulatory Visit: Payer: Self-pay | Admitting: Cardiology

## 2022-09-17 ENCOUNTER — Telehealth: Payer: Self-pay | Admitting: Family Medicine

## 2022-09-17 DIAGNOSIS — I6523 Occlusion and stenosis of bilateral carotid arteries: Secondary | ICD-10-CM

## 2022-09-17 NOTE — Telephone Encounter (Signed)
Patient wants lab orders sent over to [facility].  Patient has to have hemoglobin checked on 20th and wants to do it all in one place at one time.

## 2022-09-18 ENCOUNTER — Other Ambulatory Visit: Payer: Self-pay

## 2022-09-18 DIAGNOSIS — E871 Hypo-osmolality and hyponatremia: Secondary | ICD-10-CM | POA: Diagnosis not present

## 2022-09-18 DIAGNOSIS — N1832 Chronic kidney disease, stage 3b: Secondary | ICD-10-CM | POA: Diagnosis not present

## 2022-09-18 DIAGNOSIS — I129 Hypertensive chronic kidney disease with stage 1 through stage 4 chronic kidney disease, or unspecified chronic kidney disease: Secondary | ICD-10-CM | POA: Diagnosis not present

## 2022-09-18 DIAGNOSIS — E785 Hyperlipidemia, unspecified: Secondary | ICD-10-CM

## 2022-09-18 DIAGNOSIS — I10 Essential (primary) hypertension: Secondary | ICD-10-CM

## 2022-09-18 DIAGNOSIS — R809 Proteinuria, unspecified: Secondary | ICD-10-CM | POA: Diagnosis not present

## 2022-09-18 DIAGNOSIS — I5032 Chronic diastolic (congestive) heart failure: Secondary | ICD-10-CM | POA: Diagnosis not present

## 2022-09-18 DIAGNOSIS — E559 Vitamin D deficiency, unspecified: Secondary | ICD-10-CM

## 2022-09-18 DIAGNOSIS — D631 Anemia in chronic kidney disease: Secondary | ICD-10-CM | POA: Diagnosis not present

## 2022-09-18 DIAGNOSIS — N189 Chronic kidney disease, unspecified: Secondary | ICD-10-CM | POA: Diagnosis not present

## 2022-09-18 NOTE — Telephone Encounter (Signed)
No voicemail labs sent to [facility]

## 2022-09-19 ENCOUNTER — Telehealth: Payer: Self-pay | Admitting: Family Medicine

## 2022-09-19 ENCOUNTER — Other Ambulatory Visit: Payer: Self-pay

## 2022-09-19 ENCOUNTER — Encounter (HOSPITAL_COMMUNITY)
Admission: RE | Admit: 2022-09-19 | Discharge: 2022-09-19 | Disposition: A | Discharge status: Home / Self Care | Payer: Medicare Other | Source: Ambulatory Visit | Attending: Family Medicine | Admitting: Family Medicine

## 2022-09-19 VITALS — BP 152/60 | HR 65 | Temp 97.6°F | Resp 16

## 2022-09-19 DIAGNOSIS — N1832 Chronic kidney disease, stage 3b: Secondary | ICD-10-CM | POA: Diagnosis not present

## 2022-09-19 DIAGNOSIS — D631 Anemia in chronic kidney disease: Secondary | ICD-10-CM | POA: Diagnosis not present

## 2022-09-19 LAB — LIPID PANEL
Cholesterol: 103 mg/dL (ref 0–200)
HDL: 49 mg/dL (ref >40)
LDL Cholesterol: 45 mg/dL (ref 0–99)
Total CHOL/HDL Ratio: 2.1 RATIO
Triglycerides: 45 mg/dL (ref <150)
VLDL: 9 mg/dL (ref 0–40)

## 2022-09-19 LAB — HEPATIC FUNCTION PANEL
ALT: 14 U/L (ref 0–44)
AST: 21 U/L (ref 15–41)
Albumin: 3.7 g/dL (ref 3.5–5.0)
Alkaline Phosphatase: 81 U/L (ref 38–126)
Bilirubin, Direct: <0.1 mg/dL (ref 0.0–0.2)
Total Bilirubin: 0.3 mg/dL (ref 0.3–1.2)
Total Protein: 7.6 g/dL (ref 6.5–8.1)

## 2022-09-19 LAB — POCT HEMOGLOBIN-HEMACUE: Hemoglobin: 10.5 g/dL — ABNORMAL LOW (ref 12.0–15.0)

## 2022-09-19 LAB — TSH: TSH: 2.044 u[IU]/mL (ref 0.350–4.500)

## 2022-09-19 LAB — VITAMIN D 25 HYDROXY (VIT D DEFICIENCY, FRACTURES): Vit D, 25-Hydroxy: 52.64 ng/mL (ref 30–100)

## 2022-09-19 MED ORDER — EPOETIN ALFA-EPBX 10000 UNIT/ML IJ SOLN: 6000.0000 [IU] | Freq: Once | INTRAMUSCULAR | Status: DC

## 2022-09-19 NOTE — Telephone Encounter (Signed)
[facility], [place] stating when pt came in for labs she mentioned they were supposed to have drawled for Dr. Moshe Cipro as well. Wants to know if these labs were ordered?

## 2022-09-19 NOTE — Telephone Encounter (Signed)
Labs faxed over to [facility]

## 2022-09-19 NOTE — Progress Notes (Signed)
Diagnosis: Anemia in Chronic Kidney Disease  Provider:  Manpreet Bhutani MD  Procedure: Injection  Retacrit (epoetin alfa-epbx), Dose: 6000 units , Site: subcutaneous, Number of injections: Not Given  Hgb 10.5  Post Care: Patient declined observation  Discharge: Condition: Good, Destination: Home . AVS Provided  Performed by:  Baxter Hire, RN

## 2022-09-21 ENCOUNTER — Ambulatory Visit: Payer: Medicare Other | Attending: Nurse Practitioner | Admitting: Nurse Practitioner

## 2022-09-21 ENCOUNTER — Encounter: Payer: Self-pay | Admitting: Nurse Practitioner

## 2022-09-21 VITALS — BP 116/78 | HR 58 | Ht 61.0 in | Wt 153.8 lb

## 2022-09-21 DIAGNOSIS — I25119 Atherosclerotic heart disease of native coronary artery with unspecified angina pectoris: Secondary | ICD-10-CM

## 2022-09-21 DIAGNOSIS — I2510 Atherosclerotic heart disease of native coronary artery without angina pectoris: Secondary | ICD-10-CM | POA: Diagnosis not present

## 2022-09-21 DIAGNOSIS — I6523 Occlusion and stenosis of bilateral carotid arteries: Secondary | ICD-10-CM

## 2022-09-21 DIAGNOSIS — I10 Essential (primary) hypertension: Secondary | ICD-10-CM | POA: Diagnosis not present

## 2022-09-21 DIAGNOSIS — I4891 Unspecified atrial fibrillation: Secondary | ICD-10-CM

## 2022-09-21 DIAGNOSIS — E785 Hyperlipidemia, unspecified: Secondary | ICD-10-CM

## 2022-09-21 NOTE — Patient Instructions (Signed)
Medication Instructions:  Your physician recommends that you continue on your current medications as directed. Please refer to the Current Medication list given to you today.   Labwork: none  Testing/Procedures: none  Follow-Up: Your physician recommends that you schedule a follow-up appointment in: 2 weeks    Any Other Special Instructions Will Be Listed Below (If Applicable).     If you need a refill on your cardiac medications before your next appointment, please call your pharmacy.   

## 2022-09-21 NOTE — Progress Notes (Signed)
Office Visit    Patient Name: [name] [name] Date of Encounter: 09/21/2022  PCP:  Fayrene Helper, [place]  Cardiologist:  Rozann Lesches, MD *** Advanced Practice Provider:  No care team member to display Electrophysiologist:  None  {Press F2 to show EP APP, CHF, sleep or structural heart MD               :[id number]  { Click here to update then REFRESH NOTE - MD (PCP) or APP (Team Member)  Change PCP Type for MD, Specialty for APP is either Cardiology or Clinical Cardiac Electrophysiology  :[id number]  Chief Complaint    [name] [name] is a 82 y.o. female with a hx of *** presents today for ***   Past Medical History    Past Medical History:  Diagnosis Date   CAD (coronary artery disease)    Multivessel status post CABG October 2005   Carotid artery disease ([place])    CKD (chronic kidney disease), stage III ([place])    Essential hypertension    Glaucoma    Laser surgery 2010   Hyperlipidemia    Hypothyroidism    Iron deficiency anemia    Obesity    Past Surgical History:  Procedure Laterality Date   BIOPSY  04/27/2021   Procedure: BIOPSY;  Surgeon: Milus Banister, MD;  Location: [facility];  Service: Endoscopy;;   COLONOSCOPY N/A 02/04/2013   Procedure: COLONOSCOPY;  Surgeon: Rogene Houston, MD;  [facility]: [facility];  Service: Endoscopy;  Laterality: N/A;  1015   COLONOSCOPY N/A 12/21/2016   Procedure: COLONOSCOPY;  Surgeon: Rogene Houston, MD;  [facility];  Service: Endoscopy;  Laterality: N/A;   COLONOSCOPY WITH PROPOFOL N/A 03/22/2021   Procedure: COLONOSCOPY WITH PROPOFOL;  Surgeon: Rogene Houston, MD;  [facility];  Service: Endoscopy;  Laterality: N/A;  8:15   CORONARY ARTERY BYPASS GRAFT  October 2005   Dr. Roxy Manns - LIMA to LAD, SVG to OM 2 and OM 3, SVG to PDA   ESOPHAGOGASTRODUODENOSCOPY N/A 12/21/2016   Procedure: ESOPHAGOGASTRODUODENOSCOPY (EGD);  Surgeon: Rogene Houston, MD;  [facility];  Service: Endoscopy;  Laterality: N/A;  11:10   ESOPHAGOGASTRODUODENOSCOPY N/A 02/27/2018   Procedure: ESOPHAGOGASTRODUODENOSCOPY (EGD);  Surgeon: Rogene Houston, MD;  [facility];  Service: Endoscopy;  Laterality: N/A;   ESOPHAGOGASTRODUODENOSCOPY (EGD) WITH PROPOFOL N/A 04/27/2021   Procedure: ESOPHAGOGASTRODUODENOSCOPY (EGD) WITH PROPOFOL;  Surgeon: Milus Banister, MD;  Location: [facility];  Service: Endoscopy;  Laterality: N/A;   ESOPHAGOGASTRODUODENOSCOPY (EGD) WITH PROPOFOL N/A 08/17/2021   Procedure: ESOPHAGOGASTRODUODENOSCOPY (EGD) WITH PROPOFOL;  Surgeon: Rush Landmark Telford Nab., MD;  Location: [facility];  Service: Gastroenterology;  Laterality: N/A;   EUS N/A 04/27/2021   Procedure: UPPER ENDOSCOPIC ULTRASOUND (EUS) RADIAL;  Surgeon: Milus Banister, MD;  Location: [facility];  Service: Endoscopy;  Laterality: N/A;   EYE SURGERY  2010   laser treatment to both eyes for glaucoma   GIVENS CAPSULE STUDY N/A 03/07/2018   Procedure: GIVENS CAPSULE STUDY;  Surgeon: Rogene Houston, MD;  [facility];  Service: Endoscopy;  Laterality: N/A;   GIVENS CAPSULE STUDY N/A 02/05/2020   Procedure: GIVENS CAPSULE STUDY;  Surgeon: Rogene Houston, MD;  [facility];  Service: Endoscopy;  Laterality: N/A;  730   POLYPECTOMY  03/22/2021   Procedure: POLYPECTOMY;  Surgeon: Rogene Houston, MD;  [facility];  Service: Endoscopy;;   UPPER ESOPHAGEAL ENDOSCOPIC ULTRASOUND (EUS) N/A 08/17/2021   Procedure: UPPER ESOPHAGEAL ENDOSCOPIC ULTRASOUND (EUS);  Surgeon: Irving Copas., MD;  Location: [place];  Service: Gastroenterology;  Laterality: N/A;    Allergies  Allergies  Allergen Reactions   Tramadol Itching   Lisinopril Cough   Shrimp (Diagnostic)     Gout flares   Clonidine Derivatives Other (See Comments)    Zombie BP dropped    History of Present Illness    [name] [name] is a 82 y.o. female with a hx of ***  last seen ***.   EKGs/Labs/Other Studies Reviewed:   The following studies were reviewed today: ***  EKG:  EKG is *** ordered today.  The ekg ordered today demonstrates ***  Recent Labs: 09/05/2022: BUN 39; Creatinine, Ser 1.49; Platelets 232; Potassium 4.2; Sodium 134 09/19/2022: ALT 14; Hemoglobin 10.5; TSH 2.044  Recent Lipid Panel    Component Value Date/Time   CHOL 103 09/19/2022 1320   CHOL 137 10/05/2021 0930   TRIG 45 09/19/2022 1320   HDL 49 09/19/2022 1320   HDL 61 10/05/2021 0930   CHOLHDL 2.1 09/19/2022 1320   VLDL 9 09/19/2022 1320   LDLCALC 45 09/19/2022 1320   LDLCALC 61 10/05/2021 0930   LDLCALC 61 07/21/2019 0938    Risk Assessment/Calculations:  {Does this patient have ATRIAL FIBRILLATION?:[phone]}  Home Medications   Current Meds  Medication Sig   amLODipine (NORVASC) 5 MG tablet Take 5 mg by mouth daily.   aspirin 81 MG tablet Take 1 tablet (81 mg total) by mouth daily. Restart on 03/02/18   cetirizine (ZYRTEC) 10 MG tablet TAKE ONE TABLET BY MOUTH DAILY   chlorthalidone (HYGROTON) 25 MG tablet Take 12.5 mg by mouth every other day.   ferrous sulfate 325 (65 FE) MG tablet Take 325 mg by mouth daily with breakfast.   fish oil-omega-3 fatty acids 1000 MG capsule Take 1 g by mouth daily.   fluticasone (FLONASE) 50 MCG/ACT nasal spray USE 2 SPRAYS IN EACH NOSTRIL ONCE DAILY. SHAKE GENTLY BEFORE USING. (Patient taking differently: Place 1 spray into both nostrils daily as needed for allergies.)   hydrALAZINE (APRESOLINE) 100 MG tablet Take one tablet by mouth once daily   latanoprost (XALATAN) 0.005 % ophthalmic solution Place 1 drop into both eyes at bedtime.   levothyroxine (SYNTHROID) 100 MCG tablet TAKE ONE TABLET BY MOUTH ONCE DAILY   metoprolol succinate (TOPROL-XL) 100 MG 24 hr tablet TAKE ONE TABLET BY MOUTH DAILY. TAKE WITH OR IMMEDIATELY FOLLOWING A MEAL.   nitroGLYCERIN (NITROSTAT) 0.4 MG SL tablet DISSOLVE ONE TABLET UNDER TONGUE EVERY 5 MINUTES  UP TO 3 DOSES AS NEEDED FOR CHEST PAIN   Polyvinyl Alcohol-Povidone (REFRESH OP) Place 1 drop into both eyes at bedtime.   rosuvastatin (CRESTOR) 10 MG tablet TAKE ONE TABLET BY MOUTH DAILY.   SYSTANE ULTRA 0.4-0.3 % SOLN Place 1 drop into both eyes daily as needed (dry eyes).   telmisartan (MICARDIS) 20 MG tablet Take 10 mg by mouth daily.   trolamine salicylate (ASPERCREME) 10 % cream Apply 1 application topically as needed for muscle pain.   [DISCONTINUED] amLODipine (NORVASC) 5 MG tablet Take one tablet by mouth twice daily for blood pressure (Patient taking differently: Take 5 mg by mouth daily.)     Review of Systems   ***   All other systems reviewed and are otherwise negative except as noted above.  Physical Exam    VS:  BP  116/78   Pulse (!) 58   Ht 5\' 1"  (1.549 m)   Wt 153 lb 12.8 oz (69.8 kg)   SpO2 98%   BMI 29.06 kg/m  , BMI Body mass index is 29.06 kg/m.  Wt Readings from Last 3 Encounters:  09/21/22 153 lb 12.8 oz (69.8 kg)  05/15/22 151 lb 1.9 oz (68.5 kg)  04/26/22 153 lb 4.9 oz (69.5 kg)     GEN: Well nourished, well developed, in no acute distress. HEENT: normal. Neck: Supple, no JVD, carotid bruits, or masses. Cardiac: ***RRR, no murmurs, rubs, or gallops. No clubbing, cyanosis, edema.  ***Radials/PT 2+ and equal bilaterally.  Respiratory:  ***Respirations regular and unlabored, clear to auscultation bilaterally. GI: Soft, nontender, nondistended. MS: No deformity or atrophy. Skin: Warm and dry, no rash. Neuro:  Strength and sensation are intact. Psych: Normal affect.  Assessment & Plan    ***  {Are you ordering a CV Procedure (e.g. stress test, cath, DCCV, TEE, etc)?   Press F2        :[id number]      Disposition: Follow up {follow up:[id number]} with Rozann Lesches, MD or APP.  Signed, Finis Bud, NP 09/21/2022, 11:39 AM [place]

## 2022-09-26 ENCOUNTER — Encounter (HOSPITAL_COMMUNITY): Payer: Medicare Other

## 2022-10-02 ENCOUNTER — Ambulatory Visit (INDEPENDENT_AMBULATORY_CARE_PROVIDER_SITE_OTHER): Payer: Medicare Other | Admitting: Family Medicine

## 2022-10-02 ENCOUNTER — Encounter (INDEPENDENT_AMBULATORY_CARE_PROVIDER_SITE_OTHER): Payer: Self-pay | Admitting: *Deleted

## 2022-10-02 ENCOUNTER — Encounter: Payer: Self-pay | Admitting: Family Medicine

## 2022-10-02 VITALS — BP 135/65 | HR 66 | Ht 61.0 in | Wt 153.0 lb

## 2022-10-02 DIAGNOSIS — I10 Essential (primary) hypertension: Secondary | ICD-10-CM | POA: Diagnosis not present

## 2022-10-02 DIAGNOSIS — E785 Hyperlipidemia, unspecified: Secondary | ICD-10-CM

## 2022-10-02 DIAGNOSIS — E559 Vitamin D deficiency, unspecified: Secondary | ICD-10-CM | POA: Diagnosis not present

## 2022-10-02 DIAGNOSIS — K5790 Diverticulosis of intestine, part unspecified, without perforation or abscess without bleeding: Secondary | ICD-10-CM | POA: Diagnosis not present

## 2022-10-02 DIAGNOSIS — Z1231 Encounter for screening mammogram for malignant neoplasm of breast: Secondary | ICD-10-CM

## 2022-10-02 DIAGNOSIS — E038 Other specified hypothyroidism: Secondary | ICD-10-CM | POA: Diagnosis not present

## 2022-10-02 DIAGNOSIS — Z951 Presence of aortocoronary bypass graft: Secondary | ICD-10-CM

## 2022-10-02 DIAGNOSIS — D3A8 Other benign neuroendocrine tumors: Secondary | ICD-10-CM

## 2022-10-02 DIAGNOSIS — I4891 Unspecified atrial fibrillation: Secondary | ICD-10-CM

## 2022-10-02 NOTE — Patient Instructions (Addendum)
Annual exam November 15 or after, call if you need me before.  Fasting lipid, cmp and EGFr and TSH end September  Please schedule July mammogram at checkout.  Excellent blood pressure and labs no change in medication.  I do  with recommendations made by  cardiology both  with checking heart rhythm over time as well as need to start medication to reduce stroke risk.  You now have a  new diagnosis of atrial fibrillation.  I will also ask  that your next cardiology visit  be with your cardiologist Dr. Cindra Eves, you need to keep appointment this Friday   If you have bright red rectal bleeding or black stool you need to go to the ED as you have diverticulosis, which may cause painless rectal bleeding.  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

## 2022-10-03 ENCOUNTER — Encounter (HOSPITAL_COMMUNITY)
Admission: RE | Admit: 2022-10-03 | Payer: Medicare Other | Source: Ambulatory Visit | Attending: Family Medicine | Admitting: Family Medicine

## 2022-10-05 ENCOUNTER — Ambulatory Visit: Payer: Medicare Other | Attending: Nurse Practitioner

## 2022-10-05 ENCOUNTER — Other Ambulatory Visit: Payer: Self-pay | Admitting: Nurse Practitioner

## 2022-10-05 ENCOUNTER — Encounter: Payer: Self-pay | Admitting: Nurse Practitioner

## 2022-10-05 ENCOUNTER — Ambulatory Visit: Payer: Medicare Other | Attending: Nurse Practitioner | Admitting: Nurse Practitioner

## 2022-10-05 ENCOUNTER — Telehealth: Payer: Self-pay | Admitting: Cardiology

## 2022-10-05 ENCOUNTER — Telehealth: Payer: Self-pay | Admitting: Nurse Practitioner

## 2022-10-05 VITALS — BP 132/62 | HR 56 | Ht 61.0 in | Wt 151.8 lb

## 2022-10-05 DIAGNOSIS — I6523 Occlusion and stenosis of bilateral carotid arteries: Secondary | ICD-10-CM | POA: Diagnosis not present

## 2022-10-05 DIAGNOSIS — E785 Hyperlipidemia, unspecified: Secondary | ICD-10-CM | POA: Diagnosis not present

## 2022-10-05 DIAGNOSIS — I4891 Unspecified atrial fibrillation: Secondary | ICD-10-CM

## 2022-10-05 DIAGNOSIS — I10 Essential (primary) hypertension: Secondary | ICD-10-CM

## 2022-10-05 DIAGNOSIS — I2510 Atherosclerotic heart disease of native coronary artery without angina pectoris: Secondary | ICD-10-CM | POA: Diagnosis not present

## 2022-10-05 MED ORDER — METOPROLOL SUCCINATE ER 50 MG PO TB24: 50.0000 mg | ORAL_TABLET | Freq: Every day | ORAL | 1 refills | Status: DC

## 2022-10-05 MED ORDER — RIVAROXABAN 15 MG PO TABS: 15.0000 mg | ORAL_TABLET | Freq: Every day | ORAL | 1 refills | Status: DC

## 2022-10-05 MED ORDER — RIVAROXABAN 15 MG PO TABS: 15.0000 mg | ORAL_TABLET | Freq: Every day | ORAL | 0 refills | Status: DC

## 2022-10-05 NOTE — Telephone Encounter (Signed)
PERCERT   14 DAY ZIO MONITOR

## 2022-10-05 NOTE — Progress Notes (Unsigned)
Office Visit    Patient Name: [name] [name] Date of Encounter: 10/05/2022  PCP:  Kerri PerchesSimpson, Margaret E, MD   [facility]  Cardiologist:  Nona DellSamuel McDowell, MD  Advanced Practice Provider:  Sharlene DoryPeck, Jaimarie Rapozo, NP Electrophysiologist:  None   Chief Complaint    [name] [name] is a 82 y.o. female with a hx of A-fib, CAD, s/p CABG in 2005, HTN, HLD, carotid artery stenosis, hypothyroidism, CKD stage 3, and IDA, who presents today for A-fib follow-up.  Past Medical History    Past Medical History:  Diagnosis Date   CAD (coronary artery disease)    Multivessel status post CABG October 2005   Carotid artery disease    CKD (chronic kidney disease), stage III    Essential hypertension    Glaucoma    Laser surgery 2010   Hyperlipidemia    Hypothyroidism    Iron deficiency anemia    Obesity    Past Surgical History:  Procedure Laterality Date   BIOPSY  04/27/2021   Procedure: BIOPSY;  Surgeon: Rachael FeeJacobs, Daniel P, MD;  Location: [facility];  Service: Endoscopy;;   COLONOSCOPY N/A 02/04/2013   Procedure: COLONOSCOPY;  Surgeon: Malissa HippoNajeeb U Rehman, MD;  [facility];  Service: Endoscopy;  Laterality: N/A;  1015   COLONOSCOPY N/A 12/21/2016   Procedure: COLONOSCOPY;  Surgeon: Malissa Hippoehman, Najeeb U, MD;  [facility];  Service: Endoscopy;  Laterality: N/A;   COLONOSCOPY WITH PROPOFOL N/A 03/22/2021   Procedure: COLONOSCOPY WITH PROPOFOL;  Surgeon: Malissa Hippoehman, Najeeb U, MD;  [facility];  Service: Endoscopy;  Laterality: N/A;  8:15   CORONARY ARTERY BYPASS GRAFT  October 2005   Dr. Cornelius Moraswen - LIMA to LAD, SVG to OM 2 and OM 3, SVG to PDA   ESOPHAGOGASTRODUODENOSCOPY N/A 12/21/2016   Procedure: ESOPHAGOGASTRODUODENOSCOPY (EGD);  Surgeon: Malissa Hippoehman, Najeeb U, MD;  [facility];  Service: Endoscopy;  Laterality: N/A;  11:10   ESOPHAGOGASTRODUODENOSCOPY N/A 02/27/2018   Procedure: ESOPHAGOGASTRODUODENOSCOPY (EGD);  Surgeon: Malissa Hippoehman, Najeeb U, MD;   [facility];  Service: Endoscopy;  Laterality: N/A;   ESOPHAGOGASTRODUODENOSCOPY (EGD) WITH PROPOFOL N/A 04/27/2021   Procedure: ESOPHAGOGASTRODUODENOSCOPY (EGD) WITH PROPOFOL;  Surgeon: Rachael FeeJacobs, Daniel P, MD;  Location: [facility];  Service: Endoscopy;  Laterality: N/A;   ESOPHAGOGASTRODUODENOSCOPY (EGD) WITH PROPOFOL N/A 08/17/2021   Procedure: ESOPHAGOGASTRODUODENOSCOPY (EGD) WITH PROPOFOL;  Surgeon: Meridee ScoreMansouraty, Netty StarringGabriel Jr., MD;  [facility];  Service: Gastroenterology;  Laterality: N/A;   EUS N/A 04/27/2021   Procedure: UPPER ENDOSCOPIC ULTRASOUND (EUS) RADIAL;  Surgeon: Rachael FeeJacobs, Daniel P, MD;  Location: [facility];  Service: Endoscopy;  Laterality: N/A;   EYE SURGERY  2010   laser treatment to both eyes for glaucoma   GIVENS CAPSULE STUDY N/A 03/07/2018   Procedure: GIVENS CAPSULE STUDY;  Surgeon: Malissa Hippoehman, Najeeb U, MD;  [facility];  Service: Endoscopy;  Laterality: N/A;   GIVENS CAPSULE STUDY N/A 02/05/2020   Procedure: GIVENS CAPSULE STUDY;  Surgeon: Malissa Hippoehman, Najeeb U, MD;  [facility];  Service: Endoscopy;  Laterality: N/A;  730   POLYPECTOMY  03/22/2021   Procedure: POLYPECTOMY;  Surgeon: Malissa Hippoehman, Najeeb U, MD;  [facility];  Service: Endoscopy;;   UPPER ESOPHAGEAL ENDOSCOPIC ULTRASOUND (EUS) N/A 08/17/2021   Procedure: UPPER ESOPHAGEAL ENDOSCOPIC ULTRASOUND (EUS);  Surgeon: Lemar LoftyMansouraty, Gabriel Jr., MD;  [facility];  Service: Gastroenterology;  Laterality: N/A;    Allergies  Allergies  Allergen Reactions   Tramadol  Itching   Lisinopril Cough   Shrimp (Diagnostic)     Gout flares   Clonidine Derivatives Other (See Comments)    Zombie BP dropped    History of Present Illness    [name] [name] is a very pleasant 82 y.o. female with a PMH as mentioned above.   Last seen by Dr. Diona Browner on January 15, 2022.  Was doing well at that time.   At last OV, was doing well. EKG showed new diagnosis of A-fib.  Patient was  asymptomatic and did not know she was in A-fib.  Went over treatment options, including blood thinner.  Patient declined and wanted to wait about it and discuss it with her primary care provider.  CHA2DS2-VASc score found to be 5.    Today she presents for follow-up. Doing well. Denies any chest pain, shortness of breath, palpitations, syncope, presyncope, dizziness, orthopnea, PND, swelling or significant weight changes, acute bleeding, or claudication.   EKGs/Labs/Other Studies Reviewed:   The following studies were reviewed today:   EKG:  EKG is not ordered today.    Carotid doppler 08/2022: Summary:  Right Carotid: Velocities in the right ICA are consistent with a 60-79% stenosis.   Left Carotid: Velocities in the left ICA are consistent with a 40-59%  stenosis. The ECA appears >50% stenosed.   Vertebrals: Right vertebral artery demonstrates antegrade flow. Left  vertebral artery is antegrade with an atypical waveform.  Carotid doppler on 09/28/2021:  Summary:  Right Carotid: Velocities in the right ICA are consistent with a 60-79% stenosis.   Left Carotid: Velocities in the left ICA are consistent with a 40-59%  stenosis. The ECA appears >50% stenosed.   Vertebrals:  Bilateral vertebral arteries demonstrate antegrade flow.  Subclavians: Left subclavian artery was stenotic. Right subclavian artery  flow was disturbed.  Echo on 10/25/2020: 1. Left ventricular ejection fraction, by estimation, is 65 to 70%. The  left ventricle has normal function. Left ventricular endocardial border  not optimally defined to evaluate regional wall motion. There is mild left  ventricular hypertrophy. Left  ventricular diastolic parameters are consistent with Grade II diastolic  dysfunction (pseudonormalization). Elevated left atrial pressure. The  average left ventricular global longitudinal strain is 18.6 %. The global  longitudinal strain is normal.   2. Right ventricular systolic function is  normal. The right ventricular  size is normal.   3. Left atrial size was severely dilated.   4. Right atrial size was severely dilated.   5. The mitral valve is normal in structure. Trivial mitral valve  regurgitation. No evidence of mitral stenosis.   6. Tricuspid valve regurgitation is moderate.   7. The aortic valve is tricuspid. Aortic valve regurgitation is mild.  Mild aortic valve stenosis. Aortic valve mean gradient measures 12.0 mmHg.  Aortic valve peak gradient measures 28.1 mmHg. Aortic valve area, by VTI  measures 1.71 cm.   8. Moderate to severe pulmonary HTN, PASP is 60 mmHg.   9. The inferior vena cava is normal in size with greater than 50%  respiratory variability, suggesting right atrial pressure of 3 mmHg.   Comparison(s): Echocardiogram done 06/21/19 showed an EF of 60-65%.  Recent Labs: 09/05/2022: BUN 39; Creatinine, Ser 1.49; Platelets 232; Potassium 4.2; Sodium 134 09/19/2022: ALT 14; Hemoglobin 10.5; TSH 2.044  Recent Lipid Panel    Component Value Date/Time   CHOL 103 09/19/2022 1320   CHOL 137 10/05/2021 0930   TRIG 45 09/19/2022 1320   HDL 49 09/19/2022 1320  HDL 61 10/05/2021 0930   CHOLHDL 2.1 09/19/2022 1320   VLDL 9 09/19/2022 1320   LDLCALC 45 09/19/2022 1320   LDLCALC 61 10/05/2021 0930   LDLCALC 61 07/21/2019 0938    Risk Assessment/Calculations:   CHA2DS2-VASc Score = 5  This indicates a 7.2% annual risk of stroke. The patient's score is based upon: CHF History: 0 HTN History: 1 Diabetes History: 0 Stroke History: 0 Vascular Disease History: 1 Age Score: 2 Gender Score: 1  Home Medications    Current Medications: Current Meds  Medication Sig   amLODipine (NORVASC) 5 MG tablet Take 5 mg by mouth daily.   cetirizine (ZYRTEC) 10 MG tablet TAKE ONE TABLET BY MOUTH DAILY   chlorthalidone (HYGROTON) 25 MG tablet Take 12.5 mg by mouth every other day.   ferrous sulfate 325 (65 FE) MG tablet Take 325 mg by mouth daily with breakfast.    fish oil-omega-3 fatty acids 1000 MG capsule Take 1 g by mouth daily.   fluticasone (FLONASE) 50 MCG/ACT nasal spray USE 2 SPRAYS IN EACH NOSTRIL ONCE DAILY. SHAKE GENTLY BEFORE USING. (Patient taking differently: Place 1 spray into both nostrils daily as needed for allergies.)   hydrALAZINE (APRESOLINE) 100 MG tablet Take one tablet by mouth once daily   latanoprost (XALATAN) 0.005 % ophthalmic solution Place 1 drop into both eyes at bedtime.   levothyroxine (SYNTHROID) 100 MCG tablet TAKE ONE TABLET BY MOUTH ONCE DAILY   nitroGLYCERIN (NITROSTAT) 0.4 MG SL tablet DISSOLVE ONE TABLET UNDER TONGUE EVERY 5 MINUTES UP TO 3 DOSES AS NEEDED FOR CHEST PAIN   Polyvinyl Alcohol-Povidone (REFRESH OP) Place 1 drop into both eyes at bedtime.   rosuvastatin (CRESTOR) 10 MG tablet TAKE ONE TABLET BY MOUTH DAILY.   SYSTANE ULTRA 0.4-0.3 % SOLN Place 1 drop into both eyes daily as needed (dry eyes).   telmisartan (MICARDIS) 20 MG tablet Take 10 mg by mouth daily.   trolamine salicylate (ASPERCREME) 10 % cream Apply 1 application topically as needed for muscle pain.   UNABLE TO FIND Shower chair x 1  DX M54.36   UNABLE TO FIND Tdap x 1 fax vaccine record to [phone]   aspirin 81 MG tablet Take 1 tablet (81 mg total) by mouth daily. Restart on 03/02/18    metoprolol succinate (TOPROL-XL) 100 MG 24 hr tablet TAKE ONE TABLET BY MOUTH DAILY. TAKE WITH OR IMMEDIATELY FOLLOWING A MEAL.     Review of Systems    All other systems reviewed and are otherwise negative except as noted above.  Physical Exam    VS:  BP 132/62 (BP Location: Left Arm, Patient Position: Sitting, Cuff Size: Large)   Pulse (!) 56   Ht 5\' 1"  (1.549 m)   Wt 151 lb 12.8 oz (68.9 kg)   SpO2 94%   BMI 28.68 kg/m  , BMI Body mass index is 28.68 kg/m.  Wt Readings from Last 3 Encounters:  10/05/22 151 lb 12.8 oz (68.9 kg)  10/02/22 153 lb 0.6 oz (69.4 kg)  09/21/22 153 lb 12.8 oz (69.8 kg)     GEN: Well nourished, well developed,  in no acute distress. HEENT: normal. Neck: Supple, no JVD, carotid bruits, or masses. Cardiac: S1/S2, irregular rhythm and slow rate, no murmurs, rubs, or gallops. No clubbing, cyanosis, edema.  Radials/PT 2+ and equal bilaterally.  Respiratory:  Respirations regular and unlabored, clear to auscultation bilaterally. MS: No deformity or atrophy. Skin: Warm and dry, no rash. Neuro:  Strength and sensation are  intact. Psych: Normal affect.  Assessment & Plan    A-fib Newly diagnosed at last OV. Pt denies palpitations or tachycardia. [name] triggers. CHA2DS2-VASc score 5.  Revisited and discussed A-fib, causes, and treatment guidelines. Discussed anticoagulation and arranging a monitor to determine rhythm burden of A-fib. Will arrange monitor and stop ASA, start Xarelto 15 mg daily based on CrCl calculations and ChadsVasc score of 5.  HR, 56 bpm, will reduce Metoprolol to 50 mg daily. Discussed avoiding A-fib triggers. Heart healthy diet and regular cardiovascular exercise encouraged. Will arrange Echocardiogram to evaluate EF.   CAD, s/p CABG in 2005 Stable with no anginal symptoms. No indication for ischemic evaluation. No medication changes at this time. Heart healthy diet and regular cardiovascular exercise encouraged.    HTN BP stable. Discussed to monitor BP at home at least 2 hours after medications and sitting for 5-10 minutes. Continue meds, decreasing metoprolol. Heart healthy diet and regular cardiovascular exercise encouraged.   HLD, carotid artery stenosis Continue crestor. Labs managed by PCP. Recent dopplers showed stable bilateral carotid artery disease, 60-79% R ICA stenosis, 40-59% L ICA stenosis. Plan to recheck doppler in February 2025. Heart healthy diet and regular cardiovascular exercise encouraged.   Disposition: Follow up in 6 week(s) with Nona DellSamuel McDowell, MD or [facility].  Signed, Sharlene DoryElizabeth Annahi Short, NP 10/06/2022, 9:50 PM [facility]

## 2022-10-05 NOTE — Telephone Encounter (Signed)
Patient made aware that we will have samples for her to pick up as well as a voucher to receive the first 30 days for free.

## 2022-10-05 NOTE — Patient Instructions (Addendum)
Medication Instructions:  Your physician has recommended you make the following change in your medication:  STOP Aspirin START xarelto 15 mg once a day DECREASE metoprolol to 50 mg once a day Continue all other medications as directed  Labwork: none  Testing/Procedures: ZIO- Long Term Monitor Instructions   Your physician has requested you wear your ZIO patch monitor 14 days.   This is a single patch monitor.  [employer] supplies one patch monitor per enrollment.  Additional stickers are not available.   Please do not apply patch if you will be having a Nuclear Stress Test, Echocardiogram, Cardiac CT, MRI, or Chest Xray during the time frame you would be wearing the monitor. The patch cannot be worn during these tests.  You cannot remove and re-apply the ZIO XT patch monitor.    While looking in a mirror, press and release button in center of patch.  A small green light will flash 3-4 times .  This will be your only indicator the monitor has been turned on.     Do not shower for the first 24 hours.  You may shower after the first 24 hours.   Press button if you feel a symptom. You will hear a small click.  Record Date, Time and Symptom in the Patient Log Book.   When you are ready to remove patch, follow instructions on last 2 pages of Patient Log Book.  Stick patch monitor onto last page of Patient Log Book.   Place Patient Log Book in [place] box.  Use locking tab on box and tape box closed securely.  The [employer] and [employer] has [employer] on it.  Please place in mailbox as soon as possible.  Your physician should have your test results approximately 7 days after the monitor has been mailed back to [facility].   Call [facility] Customer Care at [phone] if you have questions regarding your ZIO XT patch monitor.  Call them immediately if you see an orange light blinking on your monitor.   If your monitor falls off in less than 4 days contact our Monitor department at  [phone].  If your monitor becomes loose or falls off after 4 days call [employer] at [phone] for suggestions on securing your monitor.   Follow-Up:  Your physician recommends that you schedule a follow-up appointment in: 6 weeks  Any Other Special Instructions Will Be Listed Below (If Applicable).  If you need a refill on your cardiac medications before your next appointment, please call your pharmacy.

## 2022-10-05 NOTE — Telephone Encounter (Signed)
Patient was seen today by Sharlene Dory and said that she wanted her to start Rivaroxaban (XARELTO) 15 MG TABS tablet starting tomorrow. Patient said that pharmacy will not have it ready until Monday. Wants to know what to do. Please call back to discuss with patient

## 2022-10-07 DIAGNOSIS — I4891 Unspecified atrial fibrillation: Secondary | ICD-10-CM | POA: Insufficient documentation

## 2022-10-07 DIAGNOSIS — K5790 Diverticulosis of intestine, part unspecified, without perforation or abscess without bleeding: Secondary | ICD-10-CM | POA: Insufficient documentation

## 2022-10-07 NOTE — Progress Notes (Signed)
[name] [name]     MRN: [id number]      DOB: April 05, 1941   HPI [name] is here for follow up and re-evaluation of chronic medical conditions, medication management and review of any available recent lab and radiology data.  Preventive health is updated, specifically  Cancer screening and Immunization.   Questions or concerns regarding consultations or procedures which the PT has had in the interim are  addressed.Has questions as to whether she should take eliquis , and about her new dx of a fib, concerned that she is not seeing her Cardiologist, has f/u apt this week with the [facility] ho saw her and I fully endorse treatment plan The PT denies any adverse reactions to current medications since the last visit.   ROS Denies recent fever or chills. Denies sinus pressure, nasal congestion, ear pain or sore throat. Denies chest congestion, productive cough or wheezing. Denies chest pains, palpitations and leg swelling Denies abdominal pain, nausea, vomiting,diarrhea or constipation.   Denies dysuria, frequency, hesitancy or incontinence. Denies joint pain, swelling and limitation in mobility. Denies headaches, seizures, numbness, or tingling. Denies depression, anxiety or insomnia. Denies skin break down or rash.   PE  BP 135/65 (BP Location: Right Arm, Patient Position: Sitting, Cuff Size: Large)   Pulse 66   Ht 5\' 1"  (1.549 m)   Wt 153 lb 0.6 oz (69.4 kg)   SpO2 90%   BMI 28.92 kg/m   Patient alert and oriented and in no cardiopulmonary distress.  HEENT: No facial asymmetry, EOMI,     Neck supple .  Chest: Clear to auscultation bilaterally.  CVS: S1, S2 no murmurs, no S3.Regular rate.  ABD: Soft non tender.   Ext: No edema  MS: Adequate ROM spine, shoulders, hips and knees.  Skin: Intact, no ulcerations or rash noted.  Psych: Good eye contact, normal affect. Memory intact not anxious or depressed appearing.  CNS: CN 2-12 intact, power,  normal throughout.no focal  deficits noted.   Assessment & Plan  Essential hypertension, malignant DASH diet and commitment to daily physical activity for a minimum of 30 minutes discussed and encouraged, as a part of hypertension management. The importance of attaining a healthy weight is also discussed.     10/05/2022   11:35 AM 10/02/2022    1:09 PM 09/21/2022   11:14 AM 09/19/2022   12:59 PM 09/05/2022   12:54 PM 08/20/2022   12:49 PM 08/01/2022   12:34 PM  BP/Weight  Systolic BP 132 135 116 152 153 128 144  Diastolic BP 62 65 78 60 66 63 83  Wt. (Lbs) 151.8 153.04 153.8      BMI 28.68 kg/m2 28.92 kg/m2 29.06 kg/m2           Hypothyroidism controlled  Hyperlipidemia LDL goal <70 Hyperlipidemia:Low fat diet discussed and encouraged.   Lipid Panel  Lab Results  Component Value Date   CHOL 103 09/19/2022   HDL 49 09/19/2022   LDLCALC 45 09/19/2022   TRIG 45 09/19/2022   CHOLHDL 2.1 09/19/2022       Benign neuroendocrine tumor of duodenum Needs GI follow up referred   CORONARY ARTERY BYPASS GRAFT, HX OF Asymptomatic , clinically stable  New onset atrial fibrillation Rate controlled, dx in 08/2022, in the early stages of w/up by cardiology, encouraged pt to follow Cardiology recommendations as far as tests and treament  go  Diverticulosis Reviewed with her the risk of painless spontaneous bleeding and advised, since starting eliquis that should  obvious or hidden gI bleed start , she should go to te ED, she reported understanding

## 2022-10-07 NOTE — Assessment & Plan Note (Signed)
DASH diet and commitment to daily physical activity for a minimum of 30 minutes discussed and encouraged, as a part of hypertension management. The importance of attaining a healthy weight is also discussed.     10/05/2022   11:35 AM 10/02/2022    1:09 PM 09/21/2022   11:14 AM 09/19/2022   12:59 PM 09/05/2022   12:54 PM 08/20/2022   12:49 PM 08/01/2022   12:34 PM  BP/Weight  Systolic BP 132 135 116 152 153 128 144  Diastolic BP 62 65 78 60 66 63 83  Wt. (Lbs) 151.8 153.04 153.8      BMI 28.68 kg/m2 28.92 kg/m2 29.06 kg/m2

## 2022-10-07 NOTE — Assessment & Plan Note (Signed)
Rate controlled, dx in 08/2022, in the early stages of w/up by cardiology, encouraged pt to follow Cardiology recommendations as far as tests and treament  go

## 2022-10-07 NOTE — Assessment & Plan Note (Signed)
Asymptomatic , clinically stable

## 2022-10-07 NOTE — Assessment & Plan Note (Signed)
Needs GI follow up referred

## 2022-10-07 NOTE — Assessment & Plan Note (Signed)
Hyperlipidemia:Low fat diet discussed and encouraged.   Lipid Panel  Lab Results  Component Value Date   CHOL 103 09/19/2022   HDL 49 09/19/2022   LDLCALC 45 09/19/2022   TRIG 45 09/19/2022   CHOLHDL 2.1 09/19/2022

## 2022-10-07 NOTE — Assessment & Plan Note (Signed)
Reviewed with her the risk of painless spontaneous bleeding and advised, since starting eliquis that should obvious or hidden gI bleed start , she should go to te ED, she reported understanding

## 2022-10-07 NOTE — Assessment & Plan Note (Signed)
controlled 

## 2022-10-09 ENCOUNTER — Encounter (HOSPITAL_COMMUNITY)
Admission: RE | Admit: 2022-10-09 | Discharge: 2022-10-09 | Disposition: A | Discharge status: Home / Self Care | Payer: Medicare Other | Source: Ambulatory Visit | Attending: Nephrology | Admitting: Nephrology

## 2022-10-09 ENCOUNTER — Other Ambulatory Visit: Payer: Self-pay | Admitting: Family Medicine

## 2022-10-09 VITALS — BP 124/62 | HR 75 | Temp 97.6°F | Resp 18

## 2022-10-09 DIAGNOSIS — D631 Anemia in chronic kidney disease: Secondary | ICD-10-CM | POA: Insufficient documentation

## 2022-10-09 DIAGNOSIS — N1832 Chronic kidney disease, stage 3b: Secondary | ICD-10-CM | POA: Diagnosis not present

## 2022-10-09 LAB — POCT HEMOGLOBIN-HEMACUE: Hemoglobin: 8.1 g/dL — ABNORMAL LOW (ref 12.0–15.0)

## 2022-10-09 MED ORDER — EPOETIN ALFA-EPBX 10000 UNIT/ML IJ SOLN
6000.0000 [IU] | Freq: Once | INTRAMUSCULAR | Status: AC
  Administered 2022-10-09: 6000 [IU] via SUBCUTANEOUS
  Filled 2022-10-09: qty 1

## 2022-10-09 NOTE — Progress Notes (Signed)
Diagnosis: Anemia in Chronic Kidney Disease  Provider:  Manpreet Bhutani MD  Procedure: Injection  Retacrit (epoetin alfa-epbx), Dose: 6000 units , Site: subcutaneous, Number of injections: 1  Hgb 8.1. Administered in right arm.  Post Care: Patient declined observation  Discharge: Condition: Good, Destination: Home . AVS Provided  Performed by:  Mabrey Howland E Nery Frappier, RN        

## 2022-10-09 NOTE — Addendum Note (Signed)
Encounter addended by: Ashwini Jago E, RN on: 10/09/2022 1:57 PM  Actions taken: Therapy plan modified

## 2022-10-16 ENCOUNTER — Ambulatory Visit (INDEPENDENT_AMBULATORY_CARE_PROVIDER_SITE_OTHER): Payer: Medicare Other | Admitting: Gastroenterology

## 2022-10-16 ENCOUNTER — Encounter (INDEPENDENT_AMBULATORY_CARE_PROVIDER_SITE_OTHER): Payer: Self-pay | Admitting: Gastroenterology

## 2022-10-16 VITALS — BP 107/68 | HR 89 | Temp 97.4°F | Ht 61.0 in | Wt 150.7 lb

## 2022-10-16 DIAGNOSIS — D509 Iron deficiency anemia, unspecified: Secondary | ICD-10-CM

## 2022-10-16 DIAGNOSIS — K219 Gastro-esophageal reflux disease without esophagitis: Secondary | ICD-10-CM

## 2022-10-16 DIAGNOSIS — D3A8 Other benign neuroendocrine tumors: Secondary | ICD-10-CM

## 2022-10-16 NOTE — Patient Instructions (Signed)
Please let me know if you change your mind regarding referral to [facility] for your history of the neuroendocrine tumor that we were not able to previously get to. For your constipation, Increase water intake, aim for atleast 64 oz per day Increase fruits, veggies and whole grains, kiwi and prunes are especially good for constipation Please make me aware of any rectal bleeding   Follow up 1 year

## 2022-10-16 NOTE — Progress Notes (Signed)
Referring Provider: Kerri Perches, MD Primary Care Physician:  Kerri Perches, MD Primary GI Physician: previously [facility]  Chief Complaint  Patient presents with   Gastroesophageal Reflux    Follow up on GERD. Reports she is doing well since she was taken off of prilosec.    Anemia    Follow up on anemia. Has not seen any blood in stool. Takes iron daily. Has labs done every 2 weeks to check hemoglobin.    neuroendocrine tumor    Follow up on neuroendocrine tumor.    HPI:   [name] [name] is a 82 y.o. female with past medical history of CAD, CKD stage III, HTN, glaucoma, HLD, hypothyroidism, IDA, NET  Patient presenting today for follow up of GERD, Anemia, Neuroendocrine tumor  History:  5+ year history of IDA, previous GI bleed, has undergone multiple EGDs/Colonoscopies and 2 givens capsule studies. Last EGD and colonoscopy were in September 2022.  However she was found to have by 10 to 12 mm submucosal lesion in second part of the duodenum.  referred to Dr. Wendall Papa for EUS. This study was performed on 04/27/2021.  He measured this lesion to be 8.4 mm.  It was in submucosa and deep muscle layer.  Biopsy revealed well-differentiated neuroendocrine tumor/G1.  It revealed low proliferative index of 1.  PET scan was negative. scheduled with endoscopic resection by Dr. Meridee Score on 08/17/2021.  He reevaluated the lesion endoscopically and felt it was too deep and risk was high.  Therefore did not proceed with resection.  He recommended repeat exam in few weeks or referral to tertiary center.  At last visit, Patient states her experience was painful as she was not deeply sedated when the procedure was performed. She was not sure how she wanted to proceed. She was recommended to have h&h, consider DBE if overt GI bleeding recurred. Continued on PPI. Referral to [facility] was discussed, however, patient preferred to hold off on this.   Last chromagranin A was 2,051 in November 2022 Last  h&h on 4/9 with hgb 8.1  Iron 133, TIBC 416, saturation 32, Ferritin 13 in December   Present: She states that her PPI was stopped by Dr. Wolfgang Phoenix due to her renal issues. She denies any acid regurgitation or heartburn. She has no abdominal pain. Appetite is good. Weight is stable, only about 4 pounds decline over the past year. Denies any BRBPR. Stools are darker on PO iron. She notes some SOB at times recently and some recent cardiac issues she is wearing a Zio patch for currently.. Denies syncope or dizziness. She has some harder stools at times with her iron pills, though this is only on occasion, she takes miralax PRN if she feels constipated. Denies any nausea or vomiting. She has her blood counts checked every 2 weeks and receives Retacrit injection if her blood counts are low    PET 05/2021: 1. No focal activity within the duodenum to the localize well differentiated neuroendocrine tumor. 2. No evidence of metastatic neuroendocrine tumor. 3. Some motion degradation in the  upper abdomen as above. Givens capsule study: 2019 Patient was able to swallow given capsule without any difficulty. Given capsule did not reach cecum during the study. Brunner's gland hypertrophy with focal speck of blood coffee-ground. No other mucosal abnormalities noted to small bowel mucosa. Last Colonoscopy: 2022 4 small polyps sigmoid diverticulosis and external hemorrhoids. 3 polyps are tubular adenomas and the fourth polyp was sessile serrated polyp.  Last Endoscopy/EUS 08/2021:- No  gross lesions in esophagus. Z-line irregular, 40 cm from the incisors. - J-shaped deformity in the entire stomach. But no gross mucosal lesions in the stomach. - Melanosis/hemosiderosis of the visualized duodenum. - Submucosal nodule found in the duodenum - consistent with previously biopsied Duodenal NET.   - After having some tension with passing the capped  therapeutic endoscope, I noted an incidental  superficial mucosal wrent at  the UES/proximal esophagus. EUS Impression: - An intramural (subepithelial) lesion was found in the second portion of the duodenum. The lesion  appeared to originate from within the deep mucosa (Layer 2) and submucosa (Layer 3). As noted above  there was what appeared to be lesion moving very close with near abutment fo the MP (Layer 4) though overt invasion was note seen. A tissue diagnosis was obtained prior to this exam. This is consistent  with a neuroendocrine tumor. I did not feelt an EMR resection today was safe based on my imaging/views,  though it could still be possible or could require ESD attempt or FTRD. - No malignant-appearing lymph nodes were visualized in the celiac region (level 20), peripancreatic region and porta hepatis region.  Recommendations:    Past Medical History:  Diagnosis Date   CAD (coronary artery disease)    Multivessel status post CABG October 2005   Carotid artery disease    CKD (chronic kidney disease), stage III    Essential hypertension    Glaucoma    Laser surgery 2010   Hyperlipidemia    Hypothyroidism    Iron deficiency anemia    Obesity     Past Surgical History:  Procedure Laterality Date   BIOPSY  04/27/2021   Procedure: BIOPSY;  Surgeon: Rachael Fee, MD;  Location: [facility];  Service: Endoscopy;;   COLONOSCOPY N/A 02/04/2013   Procedure: COLONOSCOPY;  Surgeon: Malissa Hippo, MD;  Location: [facility];  Service: Endoscopy;  Laterality: N/A;  1015   COLONOSCOPY N/A 12/21/2016   Procedure: COLONOSCOPY;  Surgeon: Malissa Hippo, MD;  Location: [facility];  Service: Endoscopy;  Laterality: N/A;   COLONOSCOPY WITH PROPOFOL N/A 03/22/2021   Procedure: COLONOSCOPY WITH PROPOFOL;  Surgeon: Malissa Hippo, MD;  Location: [facility];  Service: Endoscopy;  Laterality: N/A;  8:15   CORONARY ARTERY BYPASS GRAFT  October 2005   Dr. Cornelius Moras - LIMA to LAD, SVG to OM 2 and OM 3, SVG to PDA   ESOPHAGOGASTRODUODENOSCOPY N/A 12/21/2016    Procedure: ESOPHAGOGASTRODUODENOSCOPY (EGD);  Surgeon: Malissa Hippo, MD;  [facility]: [facility];  Service: Endoscopy;  Laterality: N/A;  11:10   ESOPHAGOGASTRODUODENOSCOPY N/A 02/27/2018   Procedure: ESOPHAGOGASTRODUODENOSCOPY (EGD);  Surgeon: Malissa Hippo, MD;  [facility]: [facility];  Service: Endoscopy;  Laterality: N/A;   ESOPHAGOGASTRODUODENOSCOPY (EGD) WITH PROPOFOL N/A 04/27/2021   Procedure: ESOPHAGOGASTRODUODENOSCOPY (EGD) WITH PROPOFOL;  Surgeon: Rachael Fee, MD;  Location: [facility];  Service: Endoscopy;  Laterality: N/A;   ESOPHAGOGASTRODUODENOSCOPY (EGD) WITH PROPOFOL N/A 08/17/2021   Procedure: ESOPHAGOGASTRODUODENOSCOPY (EGD) WITH PROPOFOL;  Surgeon: Meridee Score Netty Starring., MD;  Location[facility] ENDOSCOPY;  Service: Gastroenterology;  Laterality: N/A;   EUS N/A 04/27/2021   Procedure: UPPER ENDOSCOPIC ULTRASOUND (EUS) RADIAL;  Surgeon: Rachael Fee, MD;  Location: [facility];  Service: Endoscopy;  Laterality: N/A;   EYE SURGERY  2010   laser treatment to both eyes for glaucoma   GIVENS CAPSULE STUDY N/A 03/07/2018   Procedure: GIVENS CAPSULE STUDY;  Surgeon: Malissa Hippo, MD;  [facility];  Service: Endoscopy;  Laterality: N/A;   GIVENS CAPSULE STUDY N/A 02/05/2020   Procedure: GIVENS CAPSULE STUDY;  Surgeon: Malissa Hippo, MD;  [facility]: [facility];  Service: Endoscopy;  Laterality: N/A;  730   POLYPECTOMY  03/22/2021   Procedure: POLYPECTOMY;  Surgeon: Malissa Hippo, MD;  [facility]: [facility];  Service: Endoscopy;;   UPPER ESOPHAGEAL ENDOSCOPIC ULTRASOUND (EUS) N/A 08/17/2021   Procedure: UPPER ESOPHAGEAL ENDOSCOPIC ULTRASOUND (EUS);  Surgeon: Lemar Lofty., MD;  Location: [facility] ENDOSCOPY;  Service: Gastroenterology;  Laterality: N/A;    Current Outpatient Medications  Medication Sig Dispense Refill   amLODipine (NORVASC) 5 MG tablet TAKE ONE TABLET BY MOUTH TWICE DAILY FOR BLOOD PRESSURE 90 tablet 5   cetirizine (ZYRTEC) 10 MG  tablet TAKE ONE TABLET BY MOUTH DAILY 90 tablet 1   chlorthalidone (HYGROTON) 25 MG tablet Take 12.5 mg by mouth every other day.     ferrous sulfate 325 (65 FE) MG tablet Take 325 mg by mouth daily with breakfast.     fish oil-omega-3 fatty acids 1000 MG capsule Take 1 g by mouth daily.     fluticasone (FLONASE) 50 MCG/ACT nasal spray USE 2 SPRAYS IN EACH NOSTRIL ONCE DAILY. SHAKE GENTLY BEFORE USING. (Patient taking differently: Place 1 spray into both nostrils daily as needed for allergies.) 48 g 1   hydrALAZINE (APRESOLINE) 100 MG tablet Take one tablet by mouth once daily 90 tablet 3   latanoprost (XALATAN) 0.005 % ophthalmic solution Place 1 drop into both eyes at bedtime.     levothyroxine (SYNTHROID) 100 MCG tablet TAKE ONE TABLET BY MOUTH ONCE DAILY 90 tablet 1   metoprolol succinate (TOPROL-XL) 50 MG 24 hr tablet Take 1 tablet (50 mg total) by mouth daily. Take with or immediately following a meal. 90 tablet 1   nitroGLYCERIN (NITROSTAT) 0.4 MG SL tablet DISSOLVE ONE TABLET UNDER TONGUE EVERY 5 MINUTES UP TO 3 DOSES AS NEEDED FOR CHEST PAIN 25 tablet 3   Polyvinyl Alcohol-Povidone (REFRESH OP) Place 1 drop into both eyes at bedtime.     Rivaroxaban (XARELTO) 15 MG TABS tablet Take 1 tablet (15 mg total) by mouth daily with supper. 21 tablet 0   rosuvastatin (CRESTOR) 10 MG tablet TAKE ONE TABLET BY MOUTH DAILY. 90 tablet 3   SYSTANE ULTRA 0.4-0.3 % SOLN Place 1 drop into both eyes daily as needed (dry eyes).     telmisartan (MICARDIS) 20 MG tablet Take 10 mg by mouth daily.     trolamine salicylate (ASPERCREME) 10 % cream Apply 1 application topically as needed for muscle pain.     UNABLE TO FIND Shower chair x 1  DX M54.36 1 each 0   UNABLE TO FIND Tdap x 1 fax vaccine record to [phone] 1 each 0   No current facility-administered medications for this visit.    Allergies as of 10/16/2022 - Review Complete 10/16/2022  Allergen Reaction Noted   Tramadol Itching 06/12/2018    Lisinopril Cough 02/24/2020   Shrimp (diagnostic)  08/11/2021   Clonidine derivatives Other (See Comments) 12/27/2010    Family History  Problem Relation Age of Onset   Aneurysm Mother    Hypertension Mother    Coronary artery disease Mother    Coronary artery disease Father    Diabetes Sister    Coronary artery disease Sister    Diverticulosis Sister    COPD Sister    Kidney disease Sister    Diabetes Sister  Hypertension Sister    Breast cancer Sister 15   Hearing loss Sister    Heart Problems Sister        pacemaker   Colon cancer Brother 60       lived 18 months afterwards   Liver cancer Brother 31   Diabetes Son    Pancreatic cancer Son    Esophageal cancer Neg Hx    Inflammatory bowel disease Neg Hx    Stomach cancer Neg Hx     Social History   Socioeconomic History   Marital status: Married    Spouse name: Not on file   Number of children: Not on file   Years of education: Not on file   Highest education level: Not on file  Occupational History   Occupation: retired   Tobacco Use   Smoking status: Former    Packs/day: 0.25    Years: 40.00    Additional pack years: 0.00    Total pack years: 10.00    Types: Cigarettes    Quit date: 07/03/2003    Years since quitting: 19.3    Passive exposure: Never   Smokeless tobacco: Never  Vaping Use   Vaping Use: Never used  Substance and Sexual Activity   Alcohol use: No    Alcohol/week: 0.0 standard drinks of alcohol   Drug use: No   Sexual activity: Never  Other Topics Concern   Not on file  Social History Narrative   Not on file   Social Determinants of Health   Financial Resource Strain: Low Risk  (12/11/2021)   Overall Financial Resource Strain (CARDIA)    Difficulty of Paying Living Expenses: Not hard at all  Food Insecurity: No Food Insecurity (12/11/2021)   Hunger Vital Sign    Worried About Running Out of Food in the Last Year: Never true    Ran Out of Food in the Last Year: Never true   Transportation Needs: No Transportation Needs (12/11/2021)   PRAPARE - Administrator, Civil Service (Medical): No    Lack of Transportation (Non-Medical): No  Physical Activity: Insufficiently Active (12/11/2021)   Exercise Vital Sign    Days of Exercise per Week: 5 days    Minutes of Exercise per Session: 10 min  Stress: No Stress Concern Present (12/11/2021)   [employer] of Occupational Health - Occupational Stress Questionnaire    Feeling of Stress : Not at all  Social Connections: Moderately Integrated (12/11/2021)   Social Connection and Isolation Panel [NHANES]    Frequency of Communication with Friends and Family: More than three times a week    Frequency of Social Gatherings with Friends and Family: More than three times a week    Attends Religious Services: More than 4 times per year    Active Member of [employer] or Organizations: No    Attends Engineer, structural: Never    Marital Status: Married   Review of systems General: negative for malaise, night sweats, fever, chills, weight loss Neck: Negative for lumps, goiter, pain and significant neck swelling Resp: Negative for cough, wheezing, dyspnea at rest CV: Negative for chest pain, leg swelling, palpitations, orthopnea GI: denies melena, hematochezia, nausea, vomiting, diarrhea, constipation, dysphagia, odyonophagia, early satiety or unintentional weight loss.  MSK: Negative for joint pain or swelling, back pain, and muscle pain. Derm: Negative for itching or rash Psych: Denies depression, anxiety, memory loss, confusion. No homicidal or suicidal ideation.  Heme: Negative for prolonged bleeding, bruising easily, and swollen nodes.  Endocrine: Negative for cold or heat intolerance, polyuria, polydipsia and goiter. Neuro: negative for tremor, gait imbalance, syncope and seizures. The remainder of the review of systems is noncontributory.  Physical Exam: BP 107/68 (BP Location: Left Arm, Patient  Position: Sitting, Cuff Size: Large)   Pulse 89   Temp (!) 97.4 F (36.3 C) (Oral)   Ht 5\' 1"  (1.549 m)   Wt 150 lb 11.2 oz (68.4 kg)   BMI 28.47 kg/m  General:   Alert and oriented. No distress noted. Pleasant and cooperative.  Head:  Normocephalic and atraumatic. Eyes:  Conjuctiva clear without scleral icterus. Mouth:  Oral mucosa pink and moist. Good dentition. No lesions. Heart: Normal rate and rhythm, s1 and s2 heart sounds present.  Lungs: Clear lung sounds in all lobes. Respirations equal and unlabored. Abdomen:  +BS, soft, non-tender and non-distended. No rebound or guarding. No HSM or masses noted. Derm: No palmar erythema or jaundice Msk:  Symmetrical without gross deformities. Normal posture. Extremities:  Without edema. Neurologic:  Alert and  oriented x4 Psych:  Alert and cooperative. Normal mood and affect.  Invalid input(s): "6 MONTHS"   ASSESSMENT: [name] [name] is a 82 y.o. female presenting today for follow up of IDA, GERD, neuroendocrine tumor  GERD: not currently on PPI or H2B therapy, she is not having any complaints of heartburn or acid regurgitation. She will make me aware of recurrence of GERD symptoms  Neuroendocrine tumor: last EGD/EUS as above, lesions was unable to be resected, recommended to be seen at [facility] for further evaluation, however, patient has continued to decline any further evaluation or workup of NET. We discussed referral to [facility] today, patient tells me that given she had a bad experience and was not fully sedated during procedure last time, as well as the fact she is not having symptoms she does not wish to do any further evaluation of NET. She will make me aware if she changes her mind.   IDA: has had extensive workup with EGD/Colonoscopy/Givens capsule studies in the past. No overt GI bleeding. Last hgb was 8.1, appears to be 8-10 range at baseline, iron studies in December with saturation 32%, iron 133, TIBC 416, ferritin low  normal at 13. She is maintained on PO iron and has labs checked every 2 weeks with nephrology. Receives Retacrit PRN, last was 4/9. She does not wish to pursue any endoscopic procedures, however, if she develops overt GI bleeding, would recommend further endoscopic evaluation of her IDA.    PLAN:  Continue on PO iron  2. Continue with retacrit injections via nephrology  3. Pt to let me know if she wishes to be referred for further evaluation of NET 4. Increase water intake, aim for atleast 64 oz per day Increase fruits, veggies and whole grains, kiwi and prunes are especially good for constipation  All questions were answered, patient verbalized understanding and is in agreement with plan as outlined above.   Follow Up: 1 year  Sherif Millspaugh L. Jeanmarie Hubert, MSN, APRN, AGNP-C Adult-Gerontology Nurse Practitioner [facility]  I have reviewed the note and agree with the APP's assessment as described in this progress note  If interested on having active surveillance of NET, may do CT abdomen/pelvis and DOTATATE PET scan.  Katrinka Blazing, MD Gastroenterology and Hepatology [facility]

## 2022-10-17 ENCOUNTER — Encounter (HOSPITAL_COMMUNITY): Payer: Medicare Other

## 2022-10-19 ENCOUNTER — Inpatient Hospital Stay: Payer: Medicare Other | Admitting: Hematology

## 2022-10-24 ENCOUNTER — Telehealth (INDEPENDENT_AMBULATORY_CARE_PROVIDER_SITE_OTHER): Payer: Self-pay | Admitting: Gastroenterology

## 2022-10-24 ENCOUNTER — Other Ambulatory Visit: Payer: Self-pay

## 2022-10-24 ENCOUNTER — Observation Stay (HOSPITAL_COMMUNITY)
Admission: EM | Admit: 2022-10-24 | Discharge: 2022-10-25 | Disposition: A | Discharge status: Home / Self Care | Payer: Medicare Other | Attending: Family Medicine | Admitting: Family Medicine

## 2022-10-24 ENCOUNTER — Encounter (HOSPITAL_COMMUNITY)
Admission: RE | Admit: 2022-10-24 | Discharge: 2022-10-24 | Disposition: A | Discharge status: Home / Self Care | Payer: Medicare Other | Source: Ambulatory Visit | Attending: Family Medicine | Admitting: Family Medicine

## 2022-10-24 ENCOUNTER — Encounter (HOSPITAL_COMMUNITY): Payer: Self-pay | Admitting: *Deleted

## 2022-10-24 ENCOUNTER — Encounter (HOSPITAL_COMMUNITY): Payer: Medicare Other

## 2022-10-24 VITALS — BP 99/54 | HR 75 | Temp 97.6°F | Resp 16

## 2022-10-24 DIAGNOSIS — Z7901 Long term (current) use of anticoagulants: Secondary | ICD-10-CM | POA: Diagnosis not present

## 2022-10-24 DIAGNOSIS — Z79899 Other long term (current) drug therapy: Secondary | ICD-10-CM | POA: Diagnosis not present

## 2022-10-24 DIAGNOSIS — Z951 Presence of aortocoronary bypass graft: Secondary | ICD-10-CM | POA: Insufficient documentation

## 2022-10-24 DIAGNOSIS — Z87891 Personal history of nicotine dependence: Secondary | ICD-10-CM | POA: Diagnosis not present

## 2022-10-24 DIAGNOSIS — E038 Other specified hypothyroidism: Secondary | ICD-10-CM

## 2022-10-24 DIAGNOSIS — D631 Anemia in chronic kidney disease: Secondary | ICD-10-CM

## 2022-10-24 DIAGNOSIS — I480 Paroxysmal atrial fibrillation: Secondary | ICD-10-CM | POA: Diagnosis not present

## 2022-10-24 DIAGNOSIS — K922 Gastrointestinal hemorrhage, unspecified: Secondary | ICD-10-CM | POA: Insufficient documentation

## 2022-10-24 DIAGNOSIS — D500 Iron deficiency anemia secondary to blood loss (chronic): Secondary | ICD-10-CM | POA: Diagnosis not present

## 2022-10-24 DIAGNOSIS — R195 Other fecal abnormalities: Secondary | ICD-10-CM

## 2022-10-24 DIAGNOSIS — I2510 Atherosclerotic heart disease of native coronary artery without angina pectoris: Secondary | ICD-10-CM | POA: Diagnosis not present

## 2022-10-24 DIAGNOSIS — N183 Chronic kidney disease, stage 3 unspecified: Secondary | ICD-10-CM | POA: Diagnosis not present

## 2022-10-24 DIAGNOSIS — K219 Gastro-esophageal reflux disease without esophagitis: Secondary | ICD-10-CM | POA: Diagnosis present

## 2022-10-24 DIAGNOSIS — D649 Anemia, unspecified: Secondary | ICD-10-CM | POA: Diagnosis present

## 2022-10-24 DIAGNOSIS — E039 Hypothyroidism, unspecified: Secondary | ICD-10-CM | POA: Diagnosis present

## 2022-10-24 DIAGNOSIS — I129 Hypertensive chronic kidney disease with stage 1 through stage 4 chronic kidney disease, or unspecified chronic kidney disease: Secondary | ICD-10-CM | POA: Diagnosis not present

## 2022-10-24 HISTORY — DX: Unspecified atrial fibrillation: I48.91

## 2022-10-24 HISTORY — DX: Encounter for other specified aftercare: Z51.89

## 2022-10-24 LAB — BPAM RBC: Unit Type and Rh: 6200

## 2022-10-24 LAB — COMPREHENSIVE METABOLIC PANEL
ALT: 15 U/L (ref 0–44)
AST: 22 U/L (ref 15–41)
Albumin: 3.6 g/dL (ref 3.5–5.0)
Alkaline Phosphatase: 76 U/L (ref 38–126)
Anion gap: 7 (ref 5–15)
BUN: 42 mg/dL — ABNORMAL HIGH (ref 8–23)
CO2: 18 mmol/L — ABNORMAL LOW (ref 22–32)
Calcium: 9.4 mg/dL (ref 8.9–10.3)
Chloride: 107 mmol/L (ref 98–111)
Creatinine, Ser: 1.49 mg/dL — ABNORMAL HIGH (ref 0.44–1.00)
GFR, Estimated: 35 mL/min — ABNORMAL LOW (ref >60)
Glucose, Bld: 99 mg/dL (ref 70–99)
Potassium: 4.4 mmol/L (ref 3.5–5.1)
Sodium: 132 mmol/L — ABNORMAL LOW (ref 135–145)
Total Bilirubin: 0.5 mg/dL (ref 0.3–1.2)
Total Protein: 7.3 g/dL (ref 6.5–8.1)

## 2022-10-24 LAB — CBC WITH DIFFERENTIAL/PLATELET
Abs Immature Granulocytes: 0.07 10*3/uL (ref 0.00–0.07)
Basophils Absolute: 0 10*3/uL (ref 0.0–0.1)
Basophils Relative: 0 %
Eosinophils Absolute: 0.4 10*3/uL (ref 0.0–0.5)
Eosinophils Relative: 6 %
HCT: 23 % — ABNORMAL LOW (ref 36.0–46.0)
Hemoglobin: 7.1 g/dL — ABNORMAL LOW (ref 12.0–15.0)
Immature Granulocytes: 1 %
Lymphocytes Relative: 19 %
Lymphs Abs: 1.4 10*3/uL (ref 0.7–4.0)
MCH: 29.8 pg (ref 26.0–34.0)
MCHC: 30.9 g/dL (ref 30.0–36.0)
MCV: 96.6 fL (ref 80.0–100.0)
Monocytes Absolute: 0.5 10*3/uL (ref 0.1–1.0)
Monocytes Relative: 7 %
Neutro Abs: 4.8 10*3/uL (ref 1.7–7.7)
Neutrophils Relative %: 67 %
Platelets: 252 10*3/uL (ref 150–400)
RBC: 2.38 MIL/uL — ABNORMAL LOW (ref 3.87–5.11)
RDW: 15.3 % (ref 11.5–15.5)
WBC: 7.1 10*3/uL (ref 4.0–10.5)
nRBC: 0 % (ref 0.0–0.2)

## 2022-10-24 LAB — POC OCCULT BLOOD, ED: Fecal Occult Bld: POSITIVE — AB

## 2022-10-24 LAB — PREPARE RBC (CROSSMATCH)

## 2022-10-24 LAB — TYPE AND SCREEN

## 2022-10-24 MED ORDER — ACETAMINOPHEN 650 MG RE SUPP: 650.0000 mg | Freq: Four times a day (QID) | RECTAL | Status: DC | PRN

## 2022-10-24 MED ORDER — NITROGLYCERIN 0.4 MG SL SUBL: 0.4000 mg | SUBLINGUAL_TABLET | SUBLINGUAL | Status: DC | PRN

## 2022-10-24 MED ORDER — SODIUM CHLORIDE 0.9% IV SOLUTION: Freq: Once | INTRAVENOUS | Status: AC

## 2022-10-24 MED ORDER — CHLORTHALIDONE 25 MG PO TABS
12.5000 mg | ORAL_TABLET | ORAL | Status: DC
  Administered 2022-10-25: 12.5 mg via ORAL
  Filled 2022-10-24: qty 1

## 2022-10-24 MED ORDER — PANTOPRAZOLE SODIUM 40 MG IV SOLR
40.0000 mg | Freq: Once | INTRAVENOUS | Status: AC
  Administered 2022-10-24: 40 mg via INTRAVENOUS
  Filled 2022-10-24: qty 10

## 2022-10-24 MED ORDER — ROSUVASTATIN CALCIUM 10 MG PO TABS
10.0000 mg | ORAL_TABLET | Freq: Every day | ORAL | Status: DC
  Administered 2022-10-25: 10 mg via ORAL
  Filled 2022-10-24: qty 1

## 2022-10-24 MED ORDER — EPOETIN ALFA-EPBX 10000 UNIT/ML IJ SOLN: 6000.0000 [IU] | Freq: Once | INTRAMUSCULAR | Status: DC

## 2022-10-24 MED ORDER — AMLODIPINE BESYLATE 5 MG PO TABS
5.0000 mg | ORAL_TABLET | Freq: Every day | ORAL | Status: DC
  Administered 2022-10-25: 5 mg via ORAL
  Filled 2022-10-24: qty 1

## 2022-10-24 MED ORDER — LATANOPROST 0.005 % OP SOLN
1.0000 [drp] | Freq: Every day | OPHTHALMIC | Status: DC
  Administered 2022-10-24: 1 [drp] via OPHTHALMIC
  Filled 2022-10-24: qty 2.5

## 2022-10-24 MED ORDER — POLYETHYL GLYCOL-PROPYL GLYCOL 0.4-0.3 % OP SOLN: 1.0000 [drp] | Freq: Every day | OPHTHALMIC | Status: DC | PRN

## 2022-10-24 MED ORDER — PANTOPRAZOLE SODIUM 40 MG IV SOLR
40.0000 mg | Freq: Two times a day (BID) | INTRAVENOUS | Status: DC
  Administered 2022-10-25: 40 mg via INTRAVENOUS
  Filled 2022-10-24: qty 10

## 2022-10-24 MED ORDER — LEVOTHYROXINE SODIUM 100 MCG PO TABS: 100.0000 ug | ORAL_TABLET | Freq: Every day | ORAL | Status: DC

## 2022-10-24 MED ORDER — IRBESARTAN 150 MG PO TABS
75.0000 mg | ORAL_TABLET | Freq: Every day | ORAL | Status: DC
  Administered 2022-10-25: 75 mg via ORAL
  Filled 2022-10-24: qty 1

## 2022-10-24 MED ORDER — ACETAMINOPHEN 325 MG PO TABS: 650.0000 mg | ORAL_TABLET | Freq: Four times a day (QID) | ORAL | Status: DC | PRN

## 2022-10-24 MED ORDER — METOPROLOL SUCCINATE ER 50 MG PO TB24
50.0000 mg | ORAL_TABLET | Freq: Every day | ORAL | Status: DC
  Administered 2022-10-25: 50 mg via ORAL
  Filled 2022-10-24: qty 1

## 2022-10-24 NOTE — ED Triage Notes (Signed)
Pt sent for blood transfusion, was told her HGB is 7

## 2022-10-24 NOTE — H&P (Signed)
TR[facility] H&P   Patient Demographics:    [name] [name], is a 82 y.o. female  MRN: [id number]   DOB - [birth year]  Admit Date - 10/24/2022  Outpatient Primary MD for the patient is Kerri Perches, MD  Referring MD/NP/PA: Dr Suezanne Jacquet    Patient coming from: home(renal office)  No chief complaint on file.     HPI:    [name] [name]  is a 82 y.o. female, 82 y.o. female with medical history of duodenal angiodysplasia, hypertension, hypothyroidism, hyperlipidemia, CKD stage III, iron deficiency anemia. -Patient was sent by her nephrologist Dr. Wolfgang Phoenix for anemia with hemoglobin of 7.1, which requires transfusion, she usually have her hemoglobin checked every 2 weeks by her nephrologist, where she is receiving epo shots, hemoglobin in ED was confirmed to be 7.1, she was Hemoccult positive in ED, she reports generalized weakness, fatigue. -in ED hemoglobin came back at 7.1, she was Hemoccult positive stool, she was ordered 1 unit PRBC transfusion in ED, she was seen by GI he recommended clear liquid diet, and n.p.o. after midnight, and [facility] hospitalist consulted to admit.    Review of systems:    A full 10 point Review of Systems was done, except as stated above, all other Review of Systems were negative.   With Past History of the following :    Past Medical History:  Diagnosis Date   Blood transfusion without reported diagnosis    CAD (coronary artery disease)    Multivessel status post CABG October 2005   Carotid artery disease    CKD (chronic kidney disease), stage III    Essential hypertension    Glaucoma    Laser surgery 2010   Hyperlipidemia    Hypothyroidism    Iron deficiency anemia    Obesity       Past Surgical History:  Procedure Laterality Date   BIOPSY  04/27/2021   Procedure: BIOPSY;  Surgeon: Rachael Fee, MD;  Location: [facility];  Service:  Endoscopy;;   COLONOSCOPY N/A 02/04/2013   Procedure: COLONOSCOPY;  Surgeon: Malissa Hippo, MD;  Location: [facility];  Service: Endoscopy;  Laterality: N/A;  1015   COLONOSCOPY N/A 12/21/2016   Procedure: COLONOSCOPY;  Surgeon: Malissa Hippo, MD;  Location: [facility];  Service: Endoscopy;  Laterality: N/A;   COLONOSCOPY WITH PROPOFOL N/A 03/22/2021   Procedure: COLONOSCOPY WITH PROPOFOL;  Surgeon: Malissa Hippo, MD;  Location: [facility];  Service: Endoscopy;  Laterality: N/A;  8:15   CORONARY ARTERY BYPASS GRAFT  October 2005   Dr. Cornelius Moras - LIMA to LAD, SVG to OM 2 and OM 3, SVG to PDA   ESOPHAGOGASTRODUODENOSCOPY N/A 12/21/2016   Procedure: ESOPHAGOGASTRODUODENOSCOPY (EGD);  Surgeon: Malissa Hippo, MD;  [facility]: [facility];  Service: Endoscopy;  Laterality: N/A;  11:10   ESOPHAGOGASTRODUODENOSCOPY N/A 02/27/2018   Procedure: ESOPHAGOGASTRODUODENOSCOPY (EGD);  Surgeon: Karilyn Cota,  Joline Maxcy, MD;  Location: [facility];  Service: Endoscopy;  Laterality: N/A;   ESOPHAGOGASTRODUODENOSCOPY (EGD) WITH PROPOFOL N/A 04/27/2021   Procedure: ESOPHAGOGASTRODUODENOSCOPY (EGD) WITH PROPOFOL;  Surgeon: Rachael Fee, MD;  Location: [facility];  Service: Endoscopy;  Laterality: N/A;   ESOPHAGOGASTRODUODENOSCOPY (EGD) WITH PROPOFOL N/A 08/17/2021   Procedure: ESOPHAGOGASTRODUODENOSCOPY (EGD) WITH PROPOFOL;  Surgeon: Meridee Score Netty Starring., MD;  Location: [facility] ENDOSCOPY;  Service: Gastroenterology;  Laterality: N/A;   EUS N/A 04/27/2021   Procedure: UPPER ENDOSCOPIC ULTRASOUND (EUS) RADIAL;  Surgeon: Rachael Fee, MD;  Location: [facility];  Service: Endoscopy;  Laterality: N/A;   EYE SURGERY  2010   laser treatment to both eyes for glaucoma   GIVENS CAPSULE STUDY N/A 03/07/2018   Procedure: GIVENS CAPSULE STUDY;  Surgeon: Malissa Hippo, MD;  [facility]: [facility];  Service: Endoscopy;  Laterality: N/A;   GIVENS CAPSULE STUDY N/A 02/05/2020   Procedure: GIVENS CAPSULE STUDY;   Surgeon: Malissa Hippo, MD;  [facility]: [facility];  Service: Endoscopy;  Laterality: N/A;  730   POLYPECTOMY  03/22/2021   Procedure: POLYPECTOMY;  Surgeon: Malissa Hippo, MD;  [facility]: [facility];  Service: Endoscopy;;   UPPER ESOPHAGEAL ENDOSCOPIC ULTRASOUND (EUS) N/A 08/17/2021   Procedure: UPPER ESOPHAGEAL ENDOSCOPIC ULTRASOUND (EUS);  Surgeon: Lemar Lofty., MD;  Location: [facility] ENDOSCOPY;  Service: Gastroenterology;  Laterality: N/A;      Social History:     Social History   Tobacco Use   Smoking status: Former    Packs/day: 0.25    Years: 40.00    Additional pack years: 0.00    Total pack years: 10.00    Types: Cigarettes    Quit date: 07/03/2003    Years since quitting: 19.3    Passive exposure: Never   Smokeless tobacco: Never  Substance Use Topics   Alcohol use: No    Alcohol/week: 0.0 standard drinks of alcohol        Family History :     Family History  Problem Relation Age of Onset   Aneurysm Mother    Hypertension Mother    Coronary artery disease Mother    Coronary artery disease Father    Diabetes Sister    Coronary artery disease Sister    Diverticulosis Sister    COPD Sister    Kidney disease Sister    Diabetes Sister    Hypertension Sister    Breast cancer Sister 62   Hearing loss Sister    Heart Problems Sister        pacemaker   Colon cancer Brother 64       lived 18 months afterwards   Liver cancer Brother 39   Diabetes Son    Pancreatic cancer Son    Esophageal cancer Neg Hx    Inflammatory bowel disease Neg Hx    Stomach cancer Neg Hx       Home Medications:   Prior to Admission medications   Medication Sig Start Date End Date Taking? Authorizing Provider  amLODipine (NORVASC) 5 MG tablet TAKE ONE TABLET BY MOUTH TWICE DAILY FOR BLOOD PRESSURE Patient taking differently: Take 5 mg by mouth daily. 10/09/22  Yes Kerri Perches, MD  cetirizine (ZYRTEC) 10 MG tablet TAKE ONE TABLET BY MOUTH DAILY 09/17/22  Yes  Kerri Perches, MD  chlorthalidone (HYGROTON) 25 MG tablet Take 12.5 mg by mouth every other day. 11/02/21 11/02/22 Yes [provider]  ferrous sulfate 325 (65 FE) MG tablet  Take 325 mg by mouth daily with breakfast.   Yes [provider]  fish oil-omega-3 fatty acids 1000 MG capsule Take 1 g by mouth daily.   Yes [provider]  fluticasone (FLONASE) 50 MCG/ACT nasal spray USE 2 SPRAYS IN EACH NOSTRIL ONCE DAILY. SHAKE GENTLY BEFORE USING. 01/10/21  Yes Kerri Perches, MD  hydrALAZINE (APRESOLINE) 100 MG tablet Take one tablet by mouth once daily 05/15/22  Yes Kerri Perches, MD  latanoprost (XALATAN) 0.005 % ophthalmic solution Place 1 drop into both eyes at bedtime.   Yes [provider]  levothyroxine (SYNTHROID) 100 MCG tablet TAKE ONE TABLET BY MOUTH ONCE DAILY 08/21/22  Yes Kerri Perches, MD  metoprolol succinate (TOPROL-XL) 50 MG 24 hr tablet Take 1 tablet (50 mg total) by mouth daily. Take with or immediately following a meal. 10/05/22  Yes Sharlene Dory, NP  nitroGLYCERIN (NITROSTAT) 0.4 MG SL tablet DISSOLVE ONE TABLET UNDER TONGUE EVERY 5 MINUTES UP TO 3 DOSES AS NEEDED FOR CHEST PAIN 09/17/22  Yes Jonelle Sidle, MD  Polyvinyl Alcohol-Povidone (REFRESH OP) Place 1 drop into both eyes at bedtime.   Yes [provider]  Rivaroxaban (XARELTO) 15 MG TABS tablet Take 1 tablet (15 mg total) by mouth daily with supper. 10/05/22  Yes Sharlene Dory, NP  rosuvastatin (CRESTOR) 10 MG tablet TAKE ONE TABLET BY MOUTH DAILY. Patient taking differently: Take 10 mg by mouth daily. 02/22/22  Yes Kerri Perches, MD  SYSTANE ULTRA 0.4-0.3 % SOLN Place 1 drop into both eyes daily as needed (dry eyes). 05/22/21  Yes [provider]  telmisartan (MICARDIS) 20 MG tablet Take 10 mg by mouth daily. 03/29/22 03/29/23 Yes [provider]  trolamine salicylate (ASPERCREME) 10 % cream Apply 1 application topically as needed for  muscle pain.   Yes [provider]  UNABLE TO FIND Shower chair x 1  DX M54.36 05/11/20  Yes Kerri Perches, MD  UNABLE TO FIND Tdap x 1 fax vaccine record to [phone] 05/15/22  Yes Kerri Perches, MD     Allergies:     Allergies  Allergen Reactions   Tramadol Itching   Lisinopril Cough   Shrimp (Diagnostic)     Gout flares   Clonidine Derivatives Other (See Comments)    Zombie BP dropped     Physical Exam:   Vitals  Blood pressure 136/70, pulse 69, temperature 97.6 F (36.4 C), temperature source Oral, resp. rate 18, height 5\' 1"  (1.549 m), weight 68 kg, SpO2 98 %.   1. General well developed female , pale , lying in bed in NAD,    2. Normal affect and insight, Not Suicidal or Homicidal, Awake Alert, Oriented X 3.  3. No F.N deficits, ALL C.Nerves Intact, Strength 5/5 all 4 extremities, Sensation intact all 4 extremities, Plantars down going.  4. Ears and Eyes appear Normal, Conjunctivae clear, PERRLA. Moist Oral Mucosa.  5. Supple Neck, No JVD, No cervical lymphadenopathy appriciated, No Carotid Bruits.  6. Symmetrical Chest wall movement, Good air movement bilaterally, CTAB.  7. RRR, No Gallops, Rubs or Murmurs, No Parasternal Heave.  8. Positive Bowel Sounds, Abdomen Soft, No tenderness, No organomegaly appriciated,No rebound -guarding or rigidity.  9.  No Cyanosis, Normal Skin Turgor, No Skin Rash or Bruise.  10. Good muscle tone,  joints appear normal , no effusions, Normal ROM.     Data Review:    CBC Recent Labs  Lab 10/24/22 1403  WBC 7.1  HGB 7.1*  HCT 23.0*  PLT 252  MCV 96.6  MCH 29.8  MCHC 30.9  RDW 15.3  LYMPHSABS 1.4  MONOABS 0.5  EOSABS 0.4  BASOSABS 0.0   ------------------------------------------------------------------------------------------------------------------  Chemistries  Recent Labs  Lab 10/24/22 1403  NA 132*  K 4.4  CL 107  CO2 18*  GLUCOSE 99  BUN 42*  CREATININE 1.49*  CALCIUM 9.4   AST 22  ALT 15  ALKPHOS 76  BILITOT 0.5   ------------------------------------------------------------------------------------------------------------------ estimated creatinine clearance is 26.6 mL/min (A) (by C-G formula based on SCr of 1.49 mg/dL (H)). ------------------------------------------------------------------------------------------------------------------ No results for input(s): "TSH", "T4TOTAL", "T3FREE", "THYROIDAB" in the last 72 hours.  Invalid input(s): "FREET3"  Coagulation profile No results for input(s): "INR", "PROTIME" in the last 168 hours. ------------------------------------------------------------------------------------------------------------------- No results for input(s): "DDIMER" in the last 72 hours. -------------------------------------------------------------------------------------------------------------------  Cardiac Enzymes No results for input(s): "CKMB", "TROPONINI", "MYOGLOBIN" in the last 168 hours.  Invalid input(s): "CK" ------------------------------------------------------------------------------------------------------------------ No results found for: "BNP"   ---------------------------------------------------------------------------------------------------------------  Urinalysis    Component Value Date/Time   COLORURINE STRAW (A) 11/23/2021 1506   APPEARANCEUR CLEAR 11/23/2021 1506   LABSPEC 1.003 (L) 11/23/2021 1506   PHURINE 6.0 11/23/2021 1506   GLUCOSEU NEGATIVE 11/23/2021 1506   HGBUR NEGATIVE 11/23/2021 1506   BILIRUBINUR NEGATIVE 11/23/2021 1506   BILIRUBINUR neg 06/27/2016 1554   KETONESUR NEGATIVE 11/23/2021 1506   PROTEINUR 30 (A) 11/23/2021 1506   UROBILINOGEN 0.2 06/27/2016 1554   NITRITE NEGATIVE 11/23/2021 1506   LEUKOCYTESUR NEGATIVE 11/23/2021 1506    ----------------------------------------------------------------------------------------------------------------   Imaging Results:    No results  found.     Assessment & Plan:    Active Problems:   Hypothyroidism   CAD (coronary artery disease)   GERD (gastroesophageal reflux disease)   CKD (chronic kidney disease) stage 3, GFR 30-59 ml/min   Symptomatic anemia   Symptomatic anemia/chronic blood loss anemia/positive stool -With chronic anemia, requiring Procrit shots, globin low at 7.1 at nephrologist office, she was Hemoccult positive in ED. -Received 1 unit PRBC while in ED. -Continue to hold Xarelto. -GI consult greatly appreciated, initial recommendation to keep clear liquid diet, n.p.o. after midnight. -Keep on IV    Essential hypertension -Resume home medications  Parox A-fib -Xarelto on hold due to above   CKD stage III Creatinine at baseline, avoid nephrotoxic medications   Hyperlipidemia Continue with statin   Hypothyroidism Continue Synthroid   Coronary Artery Disease Any chest pain or shortness of breath  GERD  -Continue with PPI  DVT Prophylaxis SCDs , Xarelto on hold  AM Labs Ordered, also please review Full Orders  Family Communication: Admission, patients condition and plan of care including tests being ordered have been discussed with the patient and niece at bedside who indicate understanding and agree with the plan and Code Status.  Code Status Full  Likely DC to  home  Condition GUARDED    Consults called: GI    Admission status: inpatient    Time spent in minutes : 70 minutes   Huey Bienenstock M.D on 10/24/2022 at 7:22 PM   [facility] - Office  [phone]

## 2022-10-24 NOTE — ED Notes (Signed)
Waiting for blood to be ready

## 2022-10-24 NOTE — Progress Notes (Signed)
Patient here for Retacrit injection. Hgb checked x2 with HemoCue, results of 6.7 and 7. Upon further assessment, patient states that she has been more tired and has had some light-headedness at times. BP of 99/54 upon arrival to infusion clinic. Dr. Lucio Edward office notified of results per Bayard Hugger DNP, nurse manager of infusion clinic. Dr. Wolfgang Phoenix requested that patient be taken to ED for further evaluation and treatment. Patient transported to ED in stable condition via wheelchair by Luther Hearing MSN RN-BC and Alease Medina, RN.

## 2022-10-24 NOTE — ED Provider Notes (Signed)
[facility] EMERGENCY DEPARTMENT AT [facility] Provider Note   CSN: [id number] Arrival date & time: 10/24/22  1329     History  No chief complaint on file.   [name] [name] is a 82 y.o. female with history of CAD s/p CABG 2005, HLD, HTN, hypothyroidism, CKD, anemia of chronic disease who presents to the ER with concern for abnormal labs. She states that per her nephrologist she gets her hemoglobin checked every 2 weeks.  She came in today to have it checked, and was told her hemoglobin level was 7, to come to the ER for transfusion.  She has been feeling a little bit more tired with exertion recently.  Denies seeing any bright red or dark black stools.  Has required a blood transfusion in the past, but more frequently gets EPO shots with nephrology.  HPI     Home Medications Prior to Admission medications   Medication Sig Start Date End Date Taking? Authorizing Provider  amLODipine (NORVASC) 5 MG tablet TAKE ONE TABLET BY MOUTH TWICE DAILY FOR BLOOD PRESSURE Patient taking differently: Take 5 mg by mouth daily. 10/09/22  Yes Kerri Perches, MD  cetirizine (ZYRTEC) 10 MG tablet TAKE ONE TABLET BY MOUTH DAILY 09/17/22  Yes Kerri Perches, MD  chlorthalidone (HYGROTON) 25 MG tablet Take 12.5 mg by mouth every other day. 11/02/21 11/02/22 Yes [provider]  ferrous sulfate 325 (65 FE) MG tablet Take 325 mg by mouth daily with breakfast.   Yes [provider]  fish oil-omega-3 fatty acids 1000 MG capsule Take 1 g by mouth daily.   Yes [provider]  fluticasone (FLONASE) 50 MCG/ACT nasal spray USE 2 SPRAYS IN EACH NOSTRIL ONCE DAILY. SHAKE GENTLY BEFORE USING. 01/10/21  Yes Kerri Perches, MD  hydrALAZINE (APRESOLINE) 100 MG tablet Take one tablet by mouth once daily 05/15/22  Yes Kerri Perches, MD  latanoprost (XALATAN) 0.005 % ophthalmic solution Place 1 drop into both eyes at bedtime.   Yes [provider]  levothyroxine  (SYNTHROID) 100 MCG tablet TAKE ONE TABLET BY MOUTH ONCE DAILY 08/21/22  Yes Kerri Perches, MD  metoprolol succinate (TOPROL-XL) 50 MG 24 hr tablet Take 1 tablet (50 mg total) by mouth daily. Take with or immediately following a meal. 10/05/22  Yes Sharlene Dory, NP  nitroGLYCERIN (NITROSTAT) 0.4 MG SL tablet DISSOLVE ONE TABLET UNDER TONGUE EVERY 5 MINUTES UP TO 3 DOSES AS NEEDED FOR CHEST PAIN 09/17/22  Yes Jonelle Sidle, MD  Polyvinyl Alcohol-Povidone (REFRESH OP) Place 1 drop into both eyes at bedtime.   Yes [provider]  Rivaroxaban (XARELTO) 15 MG TABS tablet Take 1 tablet (15 mg total) by mouth daily with supper. 10/05/22  Yes Sharlene Dory, NP  rosuvastatin (CRESTOR) 10 MG tablet TAKE ONE TABLET BY MOUTH DAILY. Patient taking differently: Take 10 mg by mouth daily. 02/22/22  Yes Kerri Perches, MD  SYSTANE ULTRA 0.4-0.3 % SOLN Place 1 drop into both eyes daily as needed (dry eyes). 05/22/21  Yes [provider]  telmisartan (MICARDIS) 20 MG tablet Take 10 mg by mouth daily. 03/29/22 03/29/23 Yes [provider]  trolamine salicylate (ASPERCREME) 10 % cream Apply 1 application topically as needed for muscle pain.   Yes [provider]  UNABLE TO FIND Shower chair x 1  DX M54.36 05/11/20  Yes Kerri Perches, MD  UNABLE TO FIND Tdap x 1 fax vaccine record to [phone] 05/15/22  Yes Syliva Overman  E, MD      Allergies    Tramadol, Lisinopril, Shrimp (diagnostic), and Clonidine derivatives    Review of Systems   Review of Systems  Constitutional:  Positive for fatigue.  Gastrointestinal:  Negative for blood in stool.  All other systems reviewed and are negative.   Physical Exam Updated Vital Signs BP 136/70   Pulse 69   Temp 97.6 F (36.4 C) (Oral)   Resp 18   Ht  (1.549 m)   Wt 68 kg   SpO2 98%   BMI 28.34 kg/m  Physical Exam Vitals and nursing note reviewed. Exam conducted with a chaperone present.   Constitutional:      Appearance: Normal appearance.  HENT:     Head: Normocephalic and atraumatic.  Eyes:     Conjunctiva/sclera: Conjunctivae normal.  Cardiovascular:     Rate and Rhythm: Normal rate and regular rhythm.  Pulmonary:     Effort: Pulmonary effort is normal. No respiratory distress.     Breath sounds: Normal breath sounds.  Abdominal:     General: There is no distension.     Palpations: Abdomen is soft.     Tenderness: There is no abdominal tenderness.  Genitourinary:    Rectum: Guaiac result positive. No tenderness.     Comments: Rectal exam performed with paramedic chaperone. No gross melena on exam Skin:    General: Skin is warm and dry.  Neurological:     General: No focal deficit present.     Mental Status: She is alert.     ED Results / Procedures / Treatments   Labs (all labs ordered are listed, but only abnormal results are displayed) Labs Reviewed  CBC WITH DIFFERENTIAL/PLATELET - Abnormal; Notable for the following components:      Result Value   RBC 2.38 (*)    Hemoglobin 7.1 (*)    HCT 23.0 (*)    All other components within normal limits  COMPREHENSIVE METABOLIC PANEL - Abnormal; Notable for the following components:   Sodium 132 (*)    CO2 18 (*)    BUN 42 (*)    Creatinine, Ser 1.49 (*)    GFR, Estimated 35 (*)    All other components within normal limits  POC OCCULT BLOOD, ED - Abnormal; Notable for the following components:   Fecal Occult Bld POSITIVE (*)    All other components within normal limits  TYPE AND SCREEN  PREPARE RBC (CROSSMATCH)    EKG None  Radiology No results found.  Procedures .Critical Care  Performed by: Su Monks, PA-C Authorized by: Su Monks, PA-C   Critical care provider statement:    Critical care time (minutes):  30   Critical care was necessary to treat or prevent imminent or life-threatening deterioration of the following conditions:  Circulatory failure   Critical care was  time spent personally by me on the following activities:  Development of treatment plan with patient or surrogate, discussions with consultants, evaluation of patient's response to treatment, examination of patient, ordering and review of laboratory studies, ordering and review of radiographic studies, ordering and performing treatments and interventions, pulse oximetry, re-evaluation of patient's condition and review of old charts Comments:     Symptomatic anemia with hgb 7.1, hx CKD, requiring blood transfusion     Medications Ordered in ED Medications  0.9 %  sodium chloride infusion (Manually program via Guardrails IV Fluids) (0 mLs Intravenous Stopped 10/24/22 1856)  pantoprazole (PROTONIX) injection 40 mg (40 mg  Intravenous Given 10/24/22 1616)    ED Course/ Medical Decision Making/ A&P                             Medical Decision Making Amount and/or Complexity of Data Reviewed Labs: ordered.  Risk Prescription drug management.   This patient is a 82 y.o. female  who presents to the ED for concern of abnormal hemoglobin level on labs.   Differential diagnoses prior to evaluation: The emergent differential diagnosis includes, but is not limited to,  GI bleed, anemia of chronic disease. This is not an exhaustive differential.   Past Medical History / Co-morbidities: CAD s/p CABG 2005, HLD, HTN, hypothyroidism, CKD, anemia of chronic disease, afib on xarelto  Additional history: Chart reviewed. Pertinent results include: Pt receives EPO injections with nephrology, most recently received this AM.  Most recently saw GI on 4/16. 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   Physical Exam: Physical exam performed. The pertinent findings include: Vital signs, no acute distress.  Abdomen soft and nontender.  Rectal exam performed with chaperone, with positive guaiac result, no gross  melena.  Lab Tests/Imaging studies: I personally interpreted labs/imaging and the pertinent results include: Hemoglobin 7.1. CMP at baseline. Positive hemoccult.    Medications: I ordered medication including protonix and 1 unit PRBCs.  I have reviewed the patients home medicines and have made adjustments as needed.  Consultations obtained: I consulted with gastroenterologist Dr Jena Gauss who recommended patient receive blood transfusion believes she could follow up outpatient.  I consulted with hospitalist Dr Randol Kern who will admit. Plan to hold xarelto tonight, clear liquids, NPO after midnight, already received protonix and blood transfusion.  Disposition: I discussed this case with my attending physician Dr. Suezanne Jacquet who cosigned this note including patient's presenting symptoms, physical exam, and planned diagnostics and interventions. Attending physician recommended medical admission for this patient. I believe patient could benefit from hospital admission for observation in the setting of acute symptomatic anemia from GI bleed.  Final Clinical Impression(s) / ED Diagnoses Final diagnoses:  Gastrointestinal hemorrhage, unspecified gastrointestinal hemorrhage type  Acute on chronic anemia    Rx / DC Orders ED Discharge Orders     None      Portions of this report may have been transcribed using voice recognition software. Every effort was made to ensure accuracy; however, inadvertent computerized transcription errors may be present.    Jeanella Flattery 10/24/22 1911    Lonell Grandchild, MD 10/25/22 1308

## 2022-10-25 ENCOUNTER — Encounter (HOSPITAL_COMMUNITY): Payer: Self-pay | Admitting: Internal Medicine

## 2022-10-25 ENCOUNTER — Telehealth: Payer: Self-pay | Admitting: Gastroenterology

## 2022-10-25 DIAGNOSIS — D3A8 Other benign neuroendocrine tumors: Secondary | ICD-10-CM

## 2022-10-25 DIAGNOSIS — N1832 Chronic kidney disease, stage 3b: Secondary | ICD-10-CM | POA: Diagnosis not present

## 2022-10-25 DIAGNOSIS — E038 Other specified hypothyroidism: Secondary | ICD-10-CM | POA: Diagnosis not present

## 2022-10-25 DIAGNOSIS — D649 Anemia, unspecified: Secondary | ICD-10-CM | POA: Diagnosis not present

## 2022-10-25 DIAGNOSIS — R195 Other fecal abnormalities: Secondary | ICD-10-CM | POA: Diagnosis not present

## 2022-10-25 LAB — TYPE AND SCREEN
ABO/RH(D): A POS
Antibody Screen: NEGATIVE
Unit division: 0

## 2022-10-25 LAB — CBC
HCT: 25.5 % — ABNORMAL LOW (ref 36.0–46.0)
Hemoglobin: 8.1 g/dL — ABNORMAL LOW (ref 12.0–15.0)
MCH: 30 pg (ref 26.0–34.0)
MCHC: 31.8 g/dL (ref 30.0–36.0)
MCV: 94.4 fL (ref 80.0–100.0)
Platelets: 221 10*3/uL (ref 150–400)
RBC: 2.7 MIL/uL — ABNORMAL LOW (ref 3.87–5.11)
RDW: 15.4 % (ref 11.5–15.5)
WBC: 4.9 10*3/uL (ref 4.0–10.5)
nRBC: 0 % (ref 0.0–0.2)

## 2022-10-25 LAB — BASIC METABOLIC PANEL
Anion gap: 8 (ref 5–15)
BUN: 40 mg/dL — ABNORMAL HIGH (ref 8–23)
CO2: 17 mmol/L — ABNORMAL LOW (ref 22–32)
Calcium: 9.8 mg/dL (ref 8.9–10.3)
Chloride: 112 mmol/L — ABNORMAL HIGH (ref 98–111)
Creatinine, Ser: 1.51 mg/dL — ABNORMAL HIGH (ref 0.44–1.00)
GFR, Estimated: 35 mL/min — ABNORMAL LOW (ref >60)
Glucose, Bld: 78 mg/dL (ref 70–99)
Potassium: 3.9 mmol/L (ref 3.5–5.1)
Sodium: 137 mmol/L (ref 135–145)

## 2022-10-25 LAB — BPAM RBC
Blood Product Expiration Date: 202405202359
ISSUE DATE / TIME: 202404241609

## 2022-10-25 LAB — POCT HEMOGLOBIN-HEMACUE: Hemoglobin: 7 g/dL — ABNORMAL LOW (ref 12.0–15.0)

## 2022-10-25 MED ORDER — FAMOTIDINE 20 MG PO TABS: 20.0000 mg | ORAL_TABLET | Freq: Two times a day (BID) | ORAL | 1 refills | Status: DC

## 2022-10-25 MED ORDER — RIVAROXABAN 15 MG PO TABS: 15.0000 mg | ORAL_TABLET | Freq: Every day | ORAL | 0 refills | Status: DC

## 2022-10-25 NOTE — Care Management Obs Status (Signed)
MEDICARE OBSERVATION STATUS NOTIFICATION   Patient Details  Name: [name] [name] MRN: [id number] Date of Birth: 1940/10/05   Medicare Observation Status Notification Given:  Yes    Villa Herb, LCSWA 10/25/2022, 3:30 PM

## 2022-10-25 NOTE — Discharge Summary (Addendum)
Physician Discharge Summary   Patient: [name] [name] MRN: [id number] DOB: [birth year]  Admit date:     10/24/2022  Discharge date: 10/25/22  Discharge Physician: Kendell Bane   PCP: Kerri Perches, MD   Recommendations at discharge:   -CBC weekly results to PCP and gastroenterologist -Follow-up with a gastroenterologist in 1 week -Hold Xarelto for 3 days restart on Monday -Follow-up with cardiologist regarding risk and benefit of Xarelto given underlying history of duodenal neuroendocrine tumor Follow-up with Dr. Ellin Saba in 1 week or [facility] referral per GI for duodenal neuroendocrine tumor workup  Discharge Diagnoses: Active Problems:   Hypothyroidism   CAD (coronary artery disease)   GERD (gastroesophageal reflux disease)   CKD (chronic kidney disease) stage 3, GFR 30-59 ml/min   Symptomatic anemia   Heme positive stool  Resolved Problems:   * No resolved hospital problems. *  Hospital Course: [name] [name]  is a 82 y.o. female, 82 y.o. female with medical history of duodenal angiodysplasia, hypertension, hypothyroidism, hyperlipidemia, CKD stage III, iron deficiency anemia. -Patient was sent by her nephrologist Dr. Wolfgang Phoenix for anemia with hemoglobin of 7.1, which requires transfusion, she usually have her hemoglobin checked every 2 weeks by her nephrologist, where she is receiving epo shots, hemoglobin in ED was confirmed to be 7.1, she was Hemoccult positive in ED, she reports generalized weakness, fatigue. -in ED hemoglobin came back at 7.1, she was Hemoccult positive stool, she was ordered 1 unit PRBC transfusion in ED, she was seen by GI he recommended clear liquid diet, and n.p.o. after midnight, and [facility] hospital[facility] consulted to admit.      Symptomatic anemia/chronic blood loss anemia/positive stool -Stable -Status post GI evaluation recommending outpatient follow-up for workup, recommended [facility] referral for duodenal endocrine  tumor (GI concerned about restarting Xarelto but her benefit outweighs the risk)  Close monitoring required Patient is to follow-up with cardiology and oncology Dr. Ellin Saba  -With chronic anemia, requiring Procrit shots, globin low at 7.1 at nephrologist office, she was Hemoccult positive in ED. -Received 1 unit PRBC while in ED. -Status post IV fluid hydration      Latest Ref Rng & Units 10/25/2022    3:55 AM 10/24/2022    2:03 PM 10/24/2022    1:01 PM  CBC  WBC 4.0 - 10.5 K/uL 4.9  7.1    Hemoglobin 12.0 - 15.0 g/dL 8.1  7.1  7.0   Hematocrit 36.0 - 46.0 % 25.5  23.0    Platelets 150 - 400 K/uL 221  252       Essential hypertension -BP remained stable -Resuming home BP meds including metoprolol, Norvasc, Parox A-fib Resume Xarelto in 3 days    CKD stage III Creatinine at baseline, avoid nephrotoxic medications Lab Results  Component Value Date   CREATININE 1.51 (H) 10/25/2022   CREATININE 1.49 (H) 10/24/2022   CREATININE 1.49 (H) 09/05/2022      Hyperlipidemia Continue with statin   Hypothyroidism Continue Synthroid   Coronary Artery Disease Stable denies any chest pain   GERD  -Continue with PPI      Consultants: Gastroenterologist Procedures performed: None Disposition: Home Diet recommendation:  Discharge Diet Orders (From admission, onward)     Start     Ordered   10/25/22 0000  Diet - low sodium heart healthy        10/25/22 1336           Cardiac diet DISCHARGE MEDICATION: Allergies as of 10/25/2022  Reactions   Tramadol Itching   Lisinopril Cough   Shrimp (diagnostic)    Gout flares   Clonidine Derivatives Other (See Comments)   Zombie BP dropped        Medication List     STOP taking these medications    chlorthalidone 25 MG tablet Commonly known as: HYGROTON       TAKE these medications    amLODipine 5 MG tablet Commonly known as: NORVASC TAKE ONE TABLET BY MOUTH TWICE DAILY FOR BLOOD PRESSURE What  changed: See the new instructions.   cetirizine 10 MG tablet Commonly known as: ZYRTEC TAKE ONE TABLET BY MOUTH DAILY   famotidine 20 MG tablet Commonly known as: PEPCID Take 1 tablet (20 mg total) by mouth 2 (two) times daily.   ferrous sulfate 325 (65 FE) MG tablet Take 325 mg by mouth daily with breakfast.   fish oil-omega-3 fatty acids 1000 MG capsule Take 1 g by mouth daily.   fluticasone 50 MCG/ACT nasal spray Commonly known as: FLONASE USE 2 SPRAYS IN EACH NOSTRIL ONCE DAILY. SHAKE GENTLY BEFORE USING.   hydrALAZINE 100 MG tablet Commonly known as: APRESOLINE Take one tablet by mouth once daily   latanoprost 0.005 % ophthalmic solution Commonly known as: XALATAN Place 1 drop into both eyes at bedtime.   levothyroxine 100 MCG tablet Commonly known as: SYNTHROID TAKE ONE TABLET BY MOUTH ONCE DAILY   metoprolol succinate 50 MG 24 hr tablet Commonly known as: TOPROL-XL Take 1 tablet (50 mg total) by mouth daily. Take with or immediately following a meal.   nitroGLYCERIN 0.4 MG SL tablet Commonly known as: NITROSTAT DISSOLVE ONE TABLET UNDER TONGUE EVERY 5 MINUTES UP TO 3 DOSES AS NEEDED FOR CHEST PAIN   REFRESH OP Place 1 drop into both eyes at bedtime.   Rivaroxaban 15 MG Tabs tablet Commonly known as: XARELTO Take 1 tablet (15 mg total) by mouth daily with supper. Start taking on: October 29, 2022 What changed: These instructions start on October 29, 2022. If you are unsure what to do until then, ask your doctor or other care provider.   rosuvastatin 10 MG tablet Commonly known as: CRESTOR TAKE ONE TABLET BY MOUTH DAILY.   Systane Ultra 0.4-0.3 % Soln Generic drug: Polyethyl Glycol-Propyl Glycol Place 1 drop into both eyes daily as needed (dry eyes).   telmisartan 20 MG tablet Commonly known as: MICARDIS Take 10 mg by mouth daily.   trolamine salicylate 10 % cream Commonly known as: ASPERCREME Apply 1 application topically as needed for muscle pain.    UNABLE TO FIND Shower chair x 1  DX M54.36   UNABLE TO FIND Tdap x 1 fax vaccine record to [phone]        Follow-up Information     [facility].   Contact information: [address] [place] [address] [phone]               Discharge Exam: Ceasar Mons Weights   10/24/22 1350 10/24/22 2023  Weight: 68 kg 67.6 kg        General:  AAO x 3,  cooperative, no distress;   HEENT:  Normocephalic, PERRL, otherwise with in Normal limits   Neuro:  CNII-XII intact. , normal motor and sensation, reflexes intact   Lungs:   Clear to auscultation BL, Respirations unlabored,  No wheezes / crackles  Cardio:    S1/S2, RRR, No murmure, No Rubs or Gallops   Abdomen:  Soft, non-tender, bowel sounds active  all four quadrants, no guarding or peritoneal signs.  Muscular  skeletal:  Limited exam -global generalized weaknesses - in bed, able to move all 4 extremities,   2+ pulses,  symmetric, No pitting edema  Skin:  Dry, warm to touch, negative for any Rashes,  Wounds: Please see nursing documentation          Condition at discharge: good  The results of significant diagnostics from this hospitalization (including imaging, microbiology, ancillary and laboratory) are listed below for reference.   Imaging Studies: No results found.  Microbiology: Results for orders placed or performed during the hospital encounter of 06/28/16  Urine culture     Status: Abnormal   Collection Time: 06/28/16 10:00 AM   Specimen: Urine, Clean Catch  Result Value Ref Range Status   Specimen Description URINE, CLEAN CATCH  Final   Special Requests NONE  Final   Culture >=100,000 COLONIES/mL ESCHERICHIA COLI (A)  Final   Report Status 07/01/2016 FINAL  Final   Organism ID, Bacteria ESCHERICHIA COLI (A)  Final      Susceptibility   Escherichia coli - MIC*    AMPICILLIN <=2 SENSITIVE Sensitive     CEFAZOLIN <=4 SENSITIVE Sensitive     CEFTRIAXONE  <=1 SENSITIVE Sensitive     CIPROFLOXACIN <=0.25 SENSITIVE Sensitive     GENTAMICIN <=1 SENSITIVE Sensitive     IMIPENEM <=0.25 SENSITIVE Sensitive     NITROFURANTOIN <=16 SENSITIVE Sensitive     TRIMETH/SULFA <=20 SENSITIVE Sensitive     AMPICILLIN/SULBACTAM <=2 SENSITIVE Sensitive     PIP/TAZO <=4 SENSITIVE Sensitive     Extended ESBL NEGATIVE Sensitive     * >=100,000 COLONIES/mL ESCHERICHIA COLI    Labs: CBC: Recent Labs  Lab 10/24/22 1301 10/24/22 1403 10/25/22 0355  WBC  --  7.1 4.9  NEUTROABS  --  4.8  --   HGB 7.0* 7.1* 8.1*  HCT  --  23.0* 25.5*  MCV  --  96.6 94.4  PLT  --  252 221   Basic Metabolic Panel: Recent Labs  Lab 10/24/22 1403 10/25/22 0355  NA 132* 137  K 4.4 3.9  CL 107 112*  CO2 18* 17*  GLUCOSE 99 78  BUN 42* 40*  CREATININE 1.49* 1.51*  CALCIUM 9.4 9.8   Liver Function Tests: Recent Labs  Lab 10/24/22 1403  AST 22  ALT 15  ALKPHOS 76  BILITOT 0.5  PROT 7.3  ALBUMIN 3.6   CBG: No results for input(s): "GLUCAP" in the last 168 hours.  Discharge time spent: greater than 30 minutes.  Signed: Kendell Bane, MD [facility] 10/25/2022

## 2022-10-25 NOTE — Consult Note (Addendum)
Gastroenterology Consult   Referring Provider: No ref. provider found Primary Care Physician:  Kerri Perches, MD Primary Gastroenterologist:  Dr. Levon Hedger  Patient ID: Unknown [name]; [id number]; [birth year]   Admit date: 10/24/2022  LOS: 1 day   Date of Consultation: 10/25/2022  Reason for Consultation:  anemia, heme + stool    History of Present Illness   [name] [name] is a 82 y.o. female with h/o duodenal neuroendocrine tumor (unresectable locally, patient declined evaluation at [facility]), HTN, hypothyroidism, hyperlipidemia, CKD stage III, chronic IDA, Afib recently starting on Xarelto, CAD, sent to the ED by her nephrologist Dr. Wolfgang Phoenix for anemia with Hgb of 7.1 requiring transfusion. She has pending referral to hematology for anemia.  In the ED, heme + stool.  Hemoglobin was 7, on March 20 her hemoglobin was 10.5.  Creatinine 1.49, BUN 42, sodium 132.  Today her hemoglobin is 8.1 after 1 unit of packed red blood cells.  Patient with over a 6-year history of iron deficiency anemia secondary to GI bleeding.  She has undergone multiple EGDs and colonoscopies since then along with 2 small bowel capsule endoscopies.  Last EGD and colonoscopy September 2022.  EGD revealed nonbleeding lesion.  However she was found to have a 10 to 12 mm submucosal lesion in the second part of the duodenum.  Colonoscopy revealed 4 small polyps, sigmoid diverticulosis and external hemorrhoids.  3 polyps were tubular adenomas and the fourth was a sessile serrated polyp.  She completed endoscopic ultrasound October 2022, duodenal lesion measured 8.4 mm, biopsy revealed well-differentiated neuroendocrine tumor/D1.  It revealed low proliferative index of 1.  PET scan was negative.  She was scheduled for endoscopic resection by Dr. Meridee Score February 2023.  He reevaluated the lesion endoscopically and felt it was too deep and risk was too high to try to resect endoscopically.  He recommended repeat  exam in a few weeks or referral to a tertiary care center.  Patient reports that experience was painful she was not deeply sedated during the exam.  She has declined further attempts thus far.  Today: patient is hungry. She has been NPO since midnight. She denies overt GI bleeding. She has had some occasional constipation in the setting of oral iron. Noted some brbpr with straining in the past. She denies abdominal pain, n/v, heartburn, dysphagia. She reports last blood transfusion 4-5 years ago. She had had IV iron in the past but none recently. She has labs done every two weeks and receives Retacrit if needed.   PET 05/2021:  1. No focal activity within the duodenum to the localize well differentiated neuroendocrine tumor. 2. No evidence of metastatic neuroendocrine tumor. 3. Some motion degradation in the  upper abdomen as above. Givens capsule study: 2021  Patient was able to swallow given capsule without any difficulty. Given capsule did not reach cecum during the study.  Focal erythema and edema involving antral mucosa without stigmata of bleeding Given his capsule study 2019 Givens capsule did not reach cecum during the study Brunner's gland hypertrophy with focal speck of blood coffee-ground. No other mucosal abnormalities noted to small bowel mucosa. Last Colonoscopy: 03/2021  4 small polyps sigmoid diverticulosis and external hemorrhoids. 3 polyps are tubular adenomas and the fourth polyp was sessile serrated polyp.  Last Endoscopy/EUS 08/2021: EGD Impression: - No gross lesions in esophagus. Z-line irregular, 40 cm from the incisors. - J-shaped deformity in the entire stomach. But no gross mucosal lesions in the stomach. - Melanosis/hemosiderosis  of the visualized duodenum. - Submucosal nodule found in the duodenum - consistent with previously biopsied Duodenal NET.   - After having some tension with passing the capped  therapeutic endoscope, I noted an incidental  superficial  mucosal wrent at the UES/proximal esophagus. EUS Impression: - An intramural (subepithelial) lesion was found in the second portion of the duodenum. The lesion  appeared to originate from within the deep mucosa (Layer 2) and submucosa (Layer 3). As noted above  there was what appeared to be lesion moving very close with near abutment fo the MP (Layer 4) though overt invasion was note seen. A tissue diagnosis was obtained prior to this exam. This is consistent  with a neuroendocrine tumor. I did not feelt an EMR resection today was safe based on my imaging/views,  though it could still be possible or could require ESD attempt or FTRD. - No malignant-appearing lymph nodes were visualized in the celiac region (level 20), peripancreatic region and porta hepatis region.   Prior to Admission medications   Medication Sig Start Date End Date Taking? Authorizing Provider  amLODipine (NORVASC) 5 MG tablet TAKE ONE TABLET BY MOUTH TWICE DAILY FOR BLOOD PRESSURE Patient taking differently: Take 5 mg by mouth daily. 10/09/22  Yes Kerri Perches, MD  cetirizine (ZYRTEC) 10 MG tablet TAKE ONE TABLET BY MOUTH DAILY 09/17/22  Yes Kerri Perches, MD  chlorthalidone (HYGROTON) 25 MG tablet Take 12.5 mg by mouth every other day. 11/02/21 11/02/22 Yes [provider]  ferrous sulfate 325 (65 FE) MG tablet Take 325 mg by mouth daily with breakfast.   Yes [provider]  fish oil-omega-3 fatty acids 1000 MG capsule Take 1 g by mouth daily.   Yes [provider]  fluticasone (FLONASE) 50 MCG/ACT nasal spray USE 2 SPRAYS IN EACH NOSTRIL ONCE DAILY. SHAKE GENTLY BEFORE USING. 01/10/21  Yes Kerri Perches, MD  hydrALAZINE (APRESOLINE) 100 MG tablet Take one tablet by mouth once daily 05/15/22  Yes Kerri Perches, MD  latanoprost (XALATAN) 0.005 % ophthalmic solution Place 1 drop into both eyes at bedtime.   Yes [provider]  levothyroxine (SYNTHROID) 100 MCG tablet TAKE  ONE TABLET BY MOUTH ONCE DAILY 08/21/22  Yes Kerri Perches, MD  metoprolol succinate (TOPROL-XL) 50 MG 24 hr tablet Take 1 tablet (50 mg total) by mouth daily. Take with or immediately following a meal. 10/05/22  Yes Sharlene Dory, NP  nitroGLYCERIN (NITROSTAT) 0.4 MG SL tablet DISSOLVE ONE TABLET UNDER TONGUE EVERY 5 MINUTES UP TO 3 DOSES AS NEEDED FOR CHEST PAIN 09/17/22  Yes Jonelle Sidle, MD  Polyvinyl Alcohol-Povidone (REFRESH OP) Place 1 drop into both eyes at bedtime.   Yes [provider]  Rivaroxaban (XARELTO) 15 MG TABS tablet Take 1 tablet (15 mg total) by mouth daily with supper. 10/05/22  Yes Sharlene Dory, NP  rosuvastatin (CRESTOR) 10 MG tablet TAKE ONE TABLET BY MOUTH DAILY. Patient taking differently: Take 10 mg by mouth daily. 02/22/22  Yes Kerri Perches, MD  SYSTANE ULTRA 0.4-0.3 % SOLN Place 1 drop into both eyes daily as needed (dry eyes). 05/22/21  Yes [provider]  telmisartan (MICARDIS) 20 MG tablet Take 10 mg by mouth daily. 03/29/22 03/29/23 Yes [provider]  trolamine salicylate (ASPERCREME) 10 % cream Apply 1 application topically as needed for muscle pain.   Yes [provider]  UNABLE TO FIND Shower chair x 1  DX M54.36 05/11/20  Yes Syliva Overman  E, MD  UNABLE TO FIND Tdap x 1 fax vaccine record to [phone] 05/15/22  Yes Kerri Perches, MD    Current Facility-Administered Medications  Medication Dose Route Frequency Provider Last Rate Last Admin   acetaminophen (TYLENOL) tablet 650 mg  650 mg Oral Q6H PRN Elgergawy, Leana Roe, MD       Or   acetaminophen (TYLENOL) suppository 650 mg  650 mg Rectal Q6H PRN Elgergawy, Leana Roe, MD       amLODipine (NORVASC) tablet 5 mg  5 mg Oral Daily Elgergawy, Leana Roe, MD   5 mg at 10/25/22 1018   latanoprost (XALATAN) 0.005 % ophthalmic solution 1 drop  1 drop Both Eyes QHS Elgergawy, Leana Roe, MD   1 drop at 10/24/22 2307   levothyroxine (SYNTHROID) tablet 100  mcg  100 mcg Oral Daily Elgergawy, Leana Roe, MD       metoprolol succinate (TOPROL-XL) 24 hr tablet 50 mg  50 mg Oral Daily Elgergawy, Leana Roe, MD   50 mg at 10/25/22 1018   nitroGLYCERIN (NITROSTAT) SL tablet 0.4 mg  0.4 mg Sublingual Q5 min PRN Elgergawy, Leana Roe, MD       pantoprazole (PROTONIX) injection 40 mg  40 mg Intravenous Q12H Elgergawy, Leana Roe, MD   40 mg at 10/25/22 1020   Polyethyl Glycol-Propyl Glycol 0.4-0.3 % SOLN 1 drop  1 drop Both Eyes Daily PRN Elgergawy, Leana Roe, MD       rosuvastatin (CRESTOR) tablet 10 mg  10 mg Oral Daily Elgergawy, Leana Roe, MD   10 mg at 10/25/22 1019    Allergies as of 10/24/2022 - Review Complete 10/24/2022  Allergen Reaction Noted   Tramadol Itching 06/12/2018   Lisinopril Cough 02/24/2020   Shrimp (diagnostic)  08/11/2021   Clonidine derivatives Other (See Comments) 12/27/2010    Past Medical History:  Diagnosis Date   A-fib    Blood transfusion without reported diagnosis    CAD (coronary artery disease)    Multivessel status post CABG October 2005   Carotid artery disease    CKD (chronic kidney disease), stage III    Essential hypertension    Glaucoma    Laser surgery 2010   Hyperlipidemia    Hypothyroidism    Iron deficiency anemia    Obesity     Past Surgical History:  Procedure Laterality Date   BIOPSY  04/27/2021   Procedure: BIOPSY;  Surgeon: Rachael Fee, MD;  Location: [facility];  Service: Endoscopy;;   COLONOSCOPY N/A 02/04/2013   Procedure: COLONOSCOPY;  Surgeon: Malissa Hippo, MD;  Location: [facility];  Service: Endoscopy;  Laterality: N/A;  1015   COLONOSCOPY N/A 12/21/2016   Procedure: COLONOSCOPY;  Surgeon: Malissa Hippo, MD;  [facility]: [facility];  Service: Endoscopy;  Laterality: N/A;   COLONOSCOPY WITH PROPOFOL N/A 03/22/2021   Procedure: COLONOSCOPY WITH PROPOFOL;  Surgeon: Malissa Hippo, MD;  [facility]: [facility];  Service: Endoscopy;  Laterality: N/A;  8:15   CORONARY ARTERY BYPASS  GRAFT  October 2005   Dr. Cornelius Moras - LIMA to LAD, SVG to OM 2 and OM 3, SVG to PDA   ESOPHAGOGASTRODUODENOSCOPY N/A 12/21/2016   Procedure: ESOPHAGOGASTRODUODENOSCOPY (EGD);  Surgeon: Malissa Hippo, MD;  [facility]: [facility];  Service: Endoscopy;  Laterality: N/A;  11:10   ESOPHAGOGASTRODUODENOSCOPY N/A 02/27/2018   Procedure: ESOPHAGOGASTRODUODENOSCOPY (EGD);  Surgeon: Malissa Hippo, MD;  [facility];  Service: Endoscopy;  Laterality: N/A;   ESOPHAGOGASTRODUODENOSCOPY (  EGD) WITH PROPOFOL N/A 04/27/2021   Procedure: ESOPHAGOGASTRODUODENOSCOPY (EGD) WITH PROPOFOL;  Surgeon: Rachael Fee, MD;  Location: [facility];  Service: Endoscopy;  Laterality: N/A;   ESOPHAGOGASTRODUODENOSCOPY (EGD) WITH PROPOFOL N/A 08/17/2021   Procedure: ESOPHAGOGASTRODUODENOSCOPY (EGD) WITH PROPOFOL;  Surgeon: Meridee Score Netty Starring., MD;  Location: [facility] ENDOSCOPY;  Service: Gastroenterology;  Laterality: N/A;   EUS N/A 04/27/2021   Procedure: UPPER ENDOSCOPIC ULTRASOUND (EUS) RADIAL;  Surgeon: Rachael Fee, MD;  Location: [facility];  Service: Endoscopy;  Laterality: N/A;   EYE SURGERY  2010   laser treatment to both eyes for glaucoma   GIVENS CAPSULE STUDY N/A 03/07/2018   Procedure: GIVENS CAPSULE STUDY;  Surgeon: Malissa Hippo, MD;  [facility];  Service: Endoscopy;  Laterality: N/A;   GIVENS CAPSULE STUDY N/A 02/05/2020   Procedure: GIVENS CAPSULE STUDY;  Surgeon: Malissa Hippo, MD;  [facility];  Service: Endoscopy;  Laterality: N/A;  730   POLYPECTOMY  03/22/2021   Procedure: POLYPECTOMY;  Surgeon: Malissa Hippo, MD;  [facility]: [facility];  Service: Endoscopy;;   UPPER ESOPHAGEAL ENDOSCOPIC ULTRASOUND (EUS) N/A 08/17/2021   Procedure: UPPER ESOPHAGEAL ENDOSCOPIC ULTRASOUND (EUS);  Surgeon: Lemar Lofty., MD;  Location: [facility] ENDOSCOPY;  Service: Gastroenterology;  Laterality: N/A;    Family History  Problem Relation Age of Onset   Aneurysm Mother     Hypertension Mother    Coronary artery disease Mother    Coronary artery disease Father    Diabetes Sister    Coronary artery disease Sister    Diverticulosis Sister    COPD Sister    Kidney disease Sister    Diabetes Sister    Hypertension Sister    Breast cancer Sister 67   Hearing loss Sister    Heart Problems Sister        pacemaker   Colon cancer Brother 64       lived 18 months afterwards   Liver cancer Brother 21   Diabetes Son    Pancreatic cancer Son    Esophageal cancer Neg Hx    Inflammatory bowel disease Neg Hx    Stomach cancer Neg Hx     Social History   Socioeconomic History   Marital status: Married    Spouse name: Not on file   Number of children: Not on file   Years of education: Not on file   Highest education level: Not on file  Occupational History   Occupation: retired   Tobacco Use   Smoking status: Former    Packs/day: 0.25    Years: 40.00    Additional pack years: 0.00    Total pack years: 10.00    Types: Cigarettes    Quit date: 07/03/2003    Years since quitting: 19.3    Passive exposure: Never   Smokeless tobacco: Never  Vaping Use   Vaping Use: Never used  Substance and Sexual Activity   Alcohol use: No    Alcohol/week: 0.0 standard drinks of alcohol   Drug use: No   Sexual activity: Never  Other Topics Concern   Not on file  Social History Narrative   Not on file   Social Determinants of Health   Financial Resource Strain: Low Risk  (12/11/2021)   Overall Financial Resource Strain (CARDIA)    Difficulty of Paying Living Expenses: Not hard at all  Food Insecurity: No Food Insecurity (10/24/2022)   Hunger Vital Sign    Worried About Running Out of  Food in the Last Year: Never true    Ran Out of Food in the Last Year: Never true  Transportation Needs: No Transportation Needs (10/24/2022)   PRAPARE - Administrator, Civil Service (Medical): No    Lack of Transportation (Non-Medical): No  Physical Activity:  Insufficiently Active (12/11/2021)   Exercise Vital Sign    Days of Exercise per Week: 5 days    Minutes of Exercise per Session: 10 min  Stress: No Stress Concern Present (12/11/2021)   [employer] of Occupational Health - Occupational Stress Questionnaire    Feeling of Stress : Not at all  Social Connections: Moderately Integrated (12/11/2021)   Social Connection and Isolation Panel [NHANES]    Frequency of Communication with Friends and Family: More than three times a week    Frequency of Social Gatherings with Friends and Family: More than three times a week    Attends Religious Services: More than 4 times per year    Active Member of [employer] or Organizations: No    Attends Banker Meetings: Never    Marital Status: Married  Catering manager Violence: Not At Risk (10/24/2022)   Humiliation, Afraid, Rape, and Kick questionnaire    Fear of Current or Ex-Partner: No    Emotionally Abused: No    Physically Abused: No    Sexually Abused: No     Review of System:   General: Negative for anorexia, weight loss, fever, chills, fatigue, +weakness. Eyes: Negative for vision changes.  ENT: Negative for hoarseness, difficulty swallowing , nasal congestion. CV: Negative for chest pain, angina, palpitations, dyspnea on exertion, peripheral edema.  Respiratory: Negative for dyspnea at rest, dyspnea on exertion, cough, sputum, wheezing.  GI: See history of present illness. GU:  Negative for dysuria, hematuria, urinary incontinence, urinary frequency, nocturnal urination.  MS: Negative for joint pain, low back pain.  Derm: Negative for rash or itching.  Neuro: Negative for weakness, abnormal sensation, seizure, frequent headaches, memory loss, confusion.  Psych: Negative for anxiety, depression, suicidal ideation, hallucinations.  Endo: Negative for unusual weight change.  Heme: Negative for bruising or bleeding. Allergy: Negative for rash or hives.      Physical Examination:    Vital signs in last 24 hours: Temp:  [97.6 F (36.4 C)-98.2 F (36.8 C)] 98.2 F (36.8 C) (04/25 0349) Pulse Rate:  [34-78] 58 (04/25 0349) Resp:  [12-18] 18 (04/25 0349) BP: (110-137)/(59-72) 133/59 (04/25 0349) SpO2:  [94 %-99 %] 97 % (04/25 0349) Weight:  [67.6 kg-68 kg] 67.6 kg (04/24 2023) Last BM Date : 10/23/22  General: Well-nourished, well-developed in no acute distress.  Head: Normocephalic, atraumatic.   Eyes: Conjunctiva pink, no icterus. Mouth: Oropharyngeal mucosa moist and pink   Neck: Supple without thyromegaly, masses, or lymphadenopathy.  Lungs: Clear to auscultation bilaterally.  Heart: Regular rate and rhythm, no murmurs rubs or gallops.  Abdomen: Bowel sounds are normal, nontender, nondistended, no hepatosplenomegaly or masses, no abdominal bruits or hernia , no rebound or guarding.   Rectal: not performed Extremities: No lower extremity edema, clubbing, deformity.  Neuro: Alert and oriented x 4 , grossly normal neurologically.  Skin: Warm and dry, no rash or jaundice.   Psych: Alert and cooperative, normal mood and affect.        Intake/Output from previous day: 04/24 0701 - 04/25 0700 In: 682.5 [P.O.:100; I.V.:272.5; Blood:310] Out: -  Intake/Output this shift: No intake/output data recorded.  Lab Results:   CBC Recent Labs  10/24/22 1301 10/24/22 1403 10/25/22 0355  WBC  --  7.1 4.9  HGB 7.0* 7.1* 8.1*  HCT  --  23.0* 25.5*  MCV  --  96.6 94.4  PLT  --  252 221   BMET Recent Labs    10/24/22 1403 10/25/22 0355  NA 132* 137  K 4.4 3.9  CL 107 112*  CO2 18* 17*  GLUCOSE 99 78  BUN 42* 40*  CREATININE 1.49* 1.51*  CALCIUM 9.4 9.8   LFT Recent Labs    10/24/22 1403  BILITOT 0.5  ALKPHOS 76  AST 22  ALT 15  PROT 7.3  ALBUMIN 3.6    Lipase No results for input(s): "LIPASE" in the last 72 hours.  PT/INR No results for input(s): "LABPROT", "INR" in the last 72 hours.   Hepatitis Panel No results for input(s):  "HEPBSAG", "HCVAB", "HEPAIGM", "HEPBIGM" in the last 72 hours.   Imaging Studies:   No results found.Pierre.Alas week]  Assessment:   82 year old female with history of h/o duodenal neuroendocrine tumor (unresectable endoscopically locally, patient declined evaluation at [facility]), HTN, hypothyroidism, hyperlipidemia, CKD stage III, chronic IDA, Afib recently starting on Xarelto (10/05/22), CAD, sent to the ED by her nephrologist Dr. Wolfgang Phoenix for anemia with Hgb of 7.1 requiring transfusion. She has pending referral to hematology for anemia of chronic disease.   Anemia/Heme + stool: Hemoglobin 10.5 September 19, 2022.  Hemoglobin 8.1 on April night.  Started Xarelto 2 to 3 weeks ago.  Presenting with hemoglobin of 7.0.  Hemoglobin up to 8.1 post 1 unit of packed red blood cells.  No overt GI bleeding.  History of chronic IDA for more than 5 years with occult GI bleeding in the past.  She has had multiple EGDs/colonoscopies in 2 capsule endoscopies (although incomplete).  See above for details.  She has a known duodenal neuroendocrine tumor which could not be resected endoscopically in February 2023.  Patient has been reluctant to get [facility] for potential resection because she had a terrible experience with her last endoscopic procedure, stating that she was not fully sedated prior to onset of the procedure.  Recent decline in hemoglobin may be in part due to Xarelto which was started 2 to 3 weeks ago.  This could be exacerbating her occult bleeding, possibly from neuroendocrine tumor or other. It not is clear that patient will be able to tolerate Xarelto in setting of chronic occult GI bleeding, would need to monitor closely. If needs for repeat transfusion occur, then other considerations to anticoagulation may be needed.    Plan:   From a GI standpoint she can be worked up as an outpatient. She may benefit from further small bowel imaging given incomplete capsule endoscopy X 2.  We again  discussed potential referral to [facility] regarding duodenal neuroendocrine tumor.  Patient remains unsure.  Would consider having oncology weigh in on this, she has an upcoming appointment for anemia of chronic disease with them.  May be a candidate for active surveillance.  Patient is agreeable.  I will reach out to Dr. Ellin Saba prior to her upcoming outpatient appointment.   LOS: 1 day   We would like to thank you for the opportunity to participate in the care of [name] [name].  Leanna Battles. Dixon Boos [facility] [phone] 4/25/20241:12 PM

## 2022-10-25 NOTE — Telephone Encounter (Signed)
Patient has upcoming consultation with hem/onc next month (Dr. Ellin Saba). She was referred by  another provider for anemia. Can we also request she be seen for known duodenal neuroendocrine tumor (possible active surveillance). My inpatient consult note from today has all the details.

## 2022-10-25 NOTE — Care Management CC44 (Signed)
Condition Code 44 Documentation Completed  Patient Details  Name: [name] [name] MRN: [id number] Date of Birth: 09/30/1940   Condition Code 44 given:  Yes Patient signature on Condition Code 44 notice:  Yes Documentation of 2 MD's agreement:  Yes Code 44 added to claim:  Yes    Villa Herb, LCSWA 10/25/2022, 3:30 PM

## 2022-10-25 NOTE — Progress Notes (Signed)
PROGRESS NOTE    Patient: [name]                            PCP: Kerri Perches, MD                    DOB: Jan 07, 1941            DOA: 10/24/2022 FIE:[id number]             DOS: 10/25/2022, 11:54 AM   LOS: 1 day   Date of Service: The patient was seen and examined on 10/25/2022  Subjective:   The patient was seen and examined this morning. Hemodynamically stable. No issues overnight ... Denies of having any bloody bowel movement yet  Brief Narrative:   [name] [name]  is a 82 y.o. female, 82 y.o. female with medical history of duodenal angiodysplasia, hypertension, hypothyroidism, hyperlipidemia, CKD stage III, iron deficiency anemia. -Patient was sent by her nephrologist Dr. Wolfgang Phoenix for anemia with hemoglobin of 7.1, which requires transfusion, she usually have her hemoglobin checked every 2 weeks by her nephrologist, where she is receiving epo shots, hemoglobin in ED was confirmed to be 7.1, she was Hemoccult positive in ED, she reports generalized weakness, fatigue. -in ED hemoglobin came back at 7.1, she was Hemoccult positive stool, she was ordered 1 unit PRBC transfusion in ED, she was seen by GI he recommended clear liquid diet, and n.p.o. after midnight, and [facility] consulted to admit.      Assessment & Plan:     Active Problems:   Hypothyroidism   CAD (coronary artery disease)   GERD (gastroesophageal reflux disease)   CKD (chronic kidney disease) stage 3, GFR 30-59 ml/min   Symptomatic anemia     Symptomatic anemia/chronic blood loss anemia/positive stool -Stable - Pending GI evaluation -N.p.o. -With chronic anemia, requiring Procrit shots, globin low at 7.1 at nephrologist office, she was Hemoccult positive in ED. -Received 1 unit PRBC while in ED. -Continue to hold Xarelto. - IVF       Latest Ref Rng & Units 10/25/2022    3:55 AM 10/24/2022    2:03 PM 10/24/2022    1:01 PM  CBC  WBC 4.0 - 10.5 K/uL 4.9  7.1    Hemoglobin 12.0 - 15.0  g/dL 8.1  7.1  7.0   Hematocrit 36.0 - 46.0 % 25.5  23.0    Platelets 150 - 400 K/uL 221  252       Essential hypertension -BP remained stable -Resuming home BP meds including metoprolol, Norvasc, Parox A-fib -Continue to hold Xarelto     CKD stage III Creatinine at baseline, avoid nephrotoxic medications Lab Results  Component Value Date   CREATININE 1.51 (H) 10/25/2022   CREATININE 1.49 (H) 10/24/2022   CREATININE 1.49 (H) 09/05/2022      Hyperlipidemia Continue with statin   Hypothyroidism Continue Synthroid   Coronary Artery Disease Any chest pain or shortness of breath   GERD  -Continue with PPI   DVT Prophylaxis SCDs , Xarelto on hold    ---------------------------------------------------------------------------------------------------------------------------- Nutritional status:  The patient's BMI is: Body mass index is 28.15 kg/m. I agree with the assessment and plan as outlined ------------------------------------------------------------------------------------------------------------------------  DVT prophylaxis:  SCDs Start: 10/24/22 2006   Code Status:   Code Status: Full Code  Family Communication: No family member present at bedside- attempt will be made to update daily The above findings and plan of care  has been discussed with patient (and family)  in detail,  they expressed understanding and agreement of above. -Advance care planning has been discussed.   Admission status:   Status is: Inpatient Remains inpatient appropriate because: Needing continued valuation for GI bleed blood transfusion   Disposition: From  - home             Planning for discharge in 1-2 days: to   Procedures:   No admission procedures for hospital encounter.   Antimicrobials:  Anti-infectives (From admission, onward)    None        Medication:   amLODipine  5 mg Oral Daily   chlorthalidone  12.5 mg Oral QODAY   irbesartan  75 mg Oral Daily    latanoprost  1 drop Both Eyes QHS   levothyroxine  100 mcg Oral Daily   metoprolol succinate  50 mg Oral Daily   pantoprazole (PROTONIX) IV  40 mg Intravenous Q12H   rosuvastatin  10 mg Oral Daily    acetaminophen **OR** acetaminophen, nitroGLYCERIN, Polyethyl Glycol-Propyl Glycol   Objective:   Vitals:   10/24/22 2000 10/24/22 2023 10/24/22 2340 10/25/22 0349  BP: 137/62 114/65 110/62 (!) 133/59  Pulse: 69 72 70 (!) 58  Resp: Temp: 97.6 F (36.4 C) 97.8 F (36.6 C) 97.9 F (36.6 C) 98.2 F (36.8 C)  TempSrc: Oral Oral Oral   SpO2: 99% 98% 99% 97%  Weight:  67.6 kg    Height:   (1.549 m)      Intake/Output Summary (Last 24 hours) at 10/25/2022 1154 Last data filed at 10/25/2022 0500 Gross per 24 hour  Intake 682.5 ml  Output --  Net 682.5 ml   Filed Weights   10/24/22 1350 10/24/22 2023  Weight: 68 kg 67.6 kg     Physical examination:   Constitution:  Alert, cooperative, no distress,  Appears calm and comfortable  Psychiatric:   Normal and stable mood and affect, cognition intact,   HEENT:        Normocephalic, PERRL, otherwise with in Normal limits  Chest:         Chest symmetric Cardio vascular:  S1/S2, RRR, No murmure, No Rubs or Gallops  pulmonary: Clear to auscultation bilaterally, respirations unlabored, negative wheezes / crackles Abdomen: Soft, non-tender, non-distended, bowel sounds,no masses, no organomegaly Muscular skeletal: Limited exam - in bed, able to move all 4 extremities,   Neuro: CNII-XII intact. , normal motor and sensation, reflexes intact  Extremities: No pitting edema lower extremities, +2 pulses  Skin: Dry, warm to touch, negative for any Rashes, No open wounds Wounds: per nursing documentation   ------------------------------------------------------------------------------------------------------------------------------------------    LABs:     Latest Ref Rng & Units 10/25/2022    3:55 AM 10/24/2022    2:03 PM  10/24/2022    1:01 PM  CBC  WBC 4.0 - 10.5 K/uL 4.9  7.1    Hemoglobin 12.0 - 15.0 g/dL 8.1  7.1  7.0   Hematocrit 36.0 - 46.0 % 25.5  23.0    Platelets 150 - 400 K/uL 221  252        Latest Ref Rng & Units 10/25/2022    3:55 AM 10/24/2022    2:03 PM 09/19/2022    1:20 PM  CMP  Glucose 70 - 99 mg/dL 78  99    BUN 8 - 23 mg/dL 40  42    Creatinine 1.61 - 1.00 mg/dL 0.96  0.45  Sodium 135 - 145 mmol/L 137  132    Potassium 3.5 - 5.1 mmol/L 3.9  4.4    Chloride 98 - 111 mmol/L 112  107    CO2 22 - 32 mmol/L 17  18    Calcium 8.9 - 10.3 mg/dL 9.8  9.4    Total Protein 6.5 - 8.1 g/dL  7.3  7.6   Total Bilirubin 0.3 - 1.2 mg/dL  0.5  0.3   Alkaline Phos 38 - 126 U/L  76  81   AST 15 - 41 U/L  22  21   ALT 0 - 44 U/L  15  14        Micro Results No results found for this or any previous visit (from the past 240 hour(s)).  Radiology Reports No results found.  SIGNED: Kendell Bane, MD, FHM. FAAFP. [facility] - [facility]ist Time spent - 35 min.  In seeing, evaluating and examining the patient. Reviewing medical records, labs, drawn plan of care. [facility],  Pager (please use [email or web address] to page/ text) Please use Epic Secure Chat for non-urgent communication (7AM-7PM)  If 7PM-7AM, please contact night-coverage [email or web address], 10/25/2022, 11:54 AM

## 2022-10-25 NOTE — Hospital Course (Addendum)
[name] [name]  is a 82 y.o. female, 83 y.o. female with medical history of duodenal angiodysplasia, hypertension, hypothyroidism, hyperlipidemia, CKD stage III, iron deficiency anemia. -Patient was sent by her nephrologist Dr. Wolfgang Phoenix for anemia with hemoglobin of 7.1, which requires transfusion, she usually have her hemoglobin checked every 2 weeks by her nephrologist, where she is receiving epo shots, hemoglobin in ED was confirmed to be 7.1, she was Hemoccult positive in ED, she reports generalized weakness, fatigue. -in ED hemoglobin came back at 7.1, she was Hemoccult positive stool, she was ordered 1 unit PRBC transfusion in ED, she was seen by GI he recommended clear liquid diet, and n.p.o. after midnight, and [facility] hospitalist consulted to admit.      Assessment & Plan:     Active Problems:   Hypothyroidism   CAD (coronary artery disease)   GERD (gastroesophageal reflux disease)   CKD (chronic kidney disease) stage 3, GFR 30-59 ml/min   Symptomatic anemia     Symptomatic anemia/chronic blood loss anemia/positive stool -Stable - Pending GI evaluation -N.p.o. -With chronic anemia, requiring Procrit shots, globin low at 7.1 at nephrologist office, she was Hemoccult positive in ED. -Received 1 unit PRBC while in ED. -Continue to hold Xarelto. - IVF       Latest Ref Rng & Units 10/25/2022    3:55 AM 10/24/2022    2:03 PM 10/24/2022    1:01 PM  CBC  WBC 4.0 - 10.5 K/uL 4.9  7.1    Hemoglobin 12.0 - 15.0 g/dL 8.1  7.1  7.0   Hematocrit 36.0 - 46.0 % 25.5  23.0    Platelets 150 - 400 K/uL 221  252       Essential hypertension -BP remained stable -Resuming home BP meds including metoprolol, Norvasc, Parox A-fib -Continue to hold Xarelto     CKD stage III Creatinine at baseline, avoid nephrotoxic medications Lab Results  Component Value Date   CREATININE 1.51 (H) 10/25/2022   CREATININE 1.49 (H) 10/24/2022   CREATININE 1.49 (H) 09/05/2022      Hyperlipidemia Continue  with statin   Hypothyroidism Continue Synthroid   Coronary Artery Disease Any chest pain or shortness of breath   GERD  -Continue with PPI   DVT Prophylaxis SCDs , Xarelto on hold

## 2022-10-25 NOTE — Progress Notes (Signed)
  Transition of Care [facility]) Screening Note   Patient Details  Name: Unknown [name] Date of Birth: 05-23-1941   Transition of Care [facility]) CM/SW Contact:    Villa Herb, LCSWA Phone Number: 10/25/2022, 10:42 AM    Transition of Care Department [facility]) has reviewed patient and no TOC needs have been identified at this time. We will continue to monitor patient advancement through interdisciplinary progression rounds. If new patient transition needs arise, please place a TOC consult.

## 2022-10-25 NOTE — Progress Notes (Signed)
  Transition of Care [facility]) Screening Note   Patient Details  Name: [name] [name] Date of Birth: 1940/11/23   Transition of Care [facility]) CM/SW Contact:    Villa Herb, LCSWA Phone Number: 10/25/2022, 10:31 AM    Transition of Care Department [facility]) has reviewed patient and no TOC needs have been identified at this time. We will continue to monitor patient advancement through interdisciplinary progression rounds. If new patient transition needs arise, please place a TOC consult.

## 2022-10-26 ENCOUNTER — Telehealth: Payer: Self-pay | Admitting: Nurse Practitioner

## 2022-10-26 DIAGNOSIS — I4891 Unspecified atrial fibrillation: Secondary | ICD-10-CM | POA: Diagnosis not present

## 2022-10-26 NOTE — Progress Notes (Signed)
Spoke with Ms. Ripple about her interest in ACP Planning and she stated that she wanted to review the document. Chaplain gave her the document and will remain available if patient has any further questions. She does not wish to complete it at this time.  Rev. Jolyn Lent, M.Div.  Chaplain

## 2022-10-26 NOTE — Telephone Encounter (Signed)
Patient states that she went to [facility] Wednesday to have her infusion and her hemoglobin was down to 7. States that she was hospitalized and given a pint of blood. States that she was discharged yesterday and told to hold Xarelto (may be the possible cause of her hemoglobin being low) until Monday but to let her cardiologist know. States that she would like for [place] to call her to further discuss. Please advise

## 2022-10-26 NOTE — Addendum Note (Signed)
Addended by: Armstead Peaks on: 10/26/2022 08:21 AM   Modules accepted: Orders

## 2022-10-26 NOTE — Telephone Encounter (Signed)
Patient states that she had a stool culture that was positive for blood. Should she continue to hold Xarelto or restart it? Please advise

## 2022-10-26 NOTE — Telephone Encounter (Signed)
I sent referral over to them

## 2022-10-26 NOTE — Telephone Encounter (Signed)
Pt states she needs to speak to Lanora Manis, NP about her stay in the hospital, please advise.

## 2022-10-26 NOTE — ACP (Advance Care Planning) (Signed)
Spoke with Ms. Sedberry about her interest in ACP Planning and she stated that she wanted to review the document. Chaplain gave her the document and will remain available if patient has any further questions. She does not wish to complete it at this time.  Rev. Teah Votaw Yui Mulvaney, M.Div.  Chaplain 

## 2022-10-29 NOTE — Telephone Encounter (Signed)
Patient made aware, verbalized understanding. States that she has appointment with GI on 5/7.

## 2022-10-31 ENCOUNTER — Ambulatory Visit: Payer: Medicare Other | Admitting: Cardiology

## 2022-10-31 ENCOUNTER — Ambulatory Visit: Payer: Medicare Other | Admitting: Family Medicine

## 2022-10-31 ENCOUNTER — Encounter (HOSPITAL_COMMUNITY): Payer: Medicare Other

## 2022-10-31 DIAGNOSIS — H16143 Punctate keratitis, bilateral: Secondary | ICD-10-CM | POA: Diagnosis not present

## 2022-10-31 DIAGNOSIS — H2513 Age-related nuclear cataract, bilateral: Secondary | ICD-10-CM | POA: Diagnosis not present

## 2022-10-31 DIAGNOSIS — H40053 Ocular hypertension, bilateral: Secondary | ICD-10-CM | POA: Diagnosis not present

## 2022-10-31 DIAGNOSIS — H04123 Dry eye syndrome of bilateral lacrimal glands: Secondary | ICD-10-CM | POA: Diagnosis not present

## 2022-10-31 DIAGNOSIS — H35361 Drusen (degenerative) of macula, right eye: Secondary | ICD-10-CM | POA: Diagnosis not present

## 2022-10-31 DIAGNOSIS — E119 Type 2 diabetes mellitus without complications: Secondary | ICD-10-CM | POA: Diagnosis not present

## 2022-10-31 DIAGNOSIS — H16213 Exposure keratoconjunctivitis, bilateral: Secondary | ICD-10-CM | POA: Diagnosis not present

## 2022-11-01 ENCOUNTER — Ambulatory Visit: Payer: Medicare Other | Admitting: Cardiology

## 2022-11-01 ENCOUNTER — Ambulatory Visit: Payer: Medicare Other | Attending: Nurse Practitioner

## 2022-11-01 DIAGNOSIS — I4891 Unspecified atrial fibrillation: Secondary | ICD-10-CM

## 2022-11-01 LAB — ECHOCARDIOGRAM COMPLETE
AR max vel: 1.56 cm2
AV Area VTI: 1.57 cm2
AV Area mean vel: 1.64 cm2
AV Mean grad: 7.5 mmHg
AV Peak grad: 15.1 mmHg
AV Vena cont: 0.3 cm
Ao pk vel: 1.94 m/s
Area-P 1/2: 3.93 cm2
Calc EF: 72.5 %
MV M vel: 3.02 m/s
MV Peak grad: 36.4 mmHg
S' Lateral: 2.5 cm
Single Plane A2C EF: 73.2 %
Single Plane A4C EF: 75.5 %

## 2022-11-06 ENCOUNTER — Ambulatory Visit (INDEPENDENT_AMBULATORY_CARE_PROVIDER_SITE_OTHER): Payer: Medicare Other | Admitting: Gastroenterology

## 2022-11-07 ENCOUNTER — Encounter (HOSPITAL_COMMUNITY)
Admission: RE | Admit: 2022-11-07 | Discharge: 2022-11-07 | Disposition: A | Discharge status: Home / Self Care | Payer: Medicare Other | Source: Ambulatory Visit | Attending: Nephrology | Admitting: Nephrology

## 2022-11-07 VITALS — BP 118/60 | HR 70 | Temp 97.8°F | Resp 18

## 2022-11-07 DIAGNOSIS — D3A010 Benign carcinoid tumor of the duodenum: Secondary | ICD-10-CM | POA: Insufficient documentation

## 2022-11-07 DIAGNOSIS — D509 Iron deficiency anemia, unspecified: Secondary | ICD-10-CM | POA: Insufficient documentation

## 2022-11-07 DIAGNOSIS — N1832 Chronic kidney disease, stage 3b: Secondary | ICD-10-CM | POA: Diagnosis not present

## 2022-11-07 DIAGNOSIS — D631 Anemia in chronic kidney disease: Secondary | ICD-10-CM | POA: Diagnosis not present

## 2022-11-07 LAB — POCT HEMOGLOBIN-HEMACUE: Hemoglobin: 8.1 g/dL — ABNORMAL LOW (ref 12.0–15.0)

## 2022-11-07 MED ORDER — EPOETIN ALFA-EPBX 10000 UNIT/ML IJ SOLN
6000.0000 [IU] | Freq: Once | INTRAMUSCULAR | Status: AC
  Administered 2022-11-07: 6000 [IU] via SUBCUTANEOUS

## 2022-11-07 NOTE — Progress Notes (Signed)
Diagnosis: Anemia in Chronic Kidney Disease  Provider:  Manpreet Bhutani MD  Procedure: Injection  Retacrit (epoetin alfa-epbx), Dose: 6000 units , Site: subcutaneous, Number of injections: 1  Hgb 8.1. Administered in right arm.  Post Care: Patient declined observation  Discharge: Condition: Good, Destination: Home . AVS Provided  Performed by:  Marin Shutter, RN

## 2022-11-13 ENCOUNTER — Other Ambulatory Visit: Payer: Self-pay | Admitting: Family Medicine

## 2022-11-14 ENCOUNTER — Encounter (HOSPITAL_COMMUNITY): Payer: Medicare Other

## 2022-11-15 DIAGNOSIS — D3A010 Benign carcinoid tumor of the duodenum: Secondary | ICD-10-CM | POA: Insufficient documentation

## 2022-11-15 NOTE — Progress Notes (Signed)
[facility] [address], [place] [address]   Clinic Day:  11/16/2022  Referring physician: Randa Lynn, MD  Patient Care Team: Kerri Perches, MD as PCP - General (Family Medicine) Jonelle Sidle, MD as PCP - Cardiology (Cardiology) Luis Abed, MD as Consulting Physician (Cardiology) Sallye Lat, MD as Consulting Physician (Optometry) Adam Phenix, DPM as Consulting Physician (Podiatry) Sharlene Dory, NP as Nurse Practitioner (Cardiology) Doreatha Massed, MD as Medical Oncologist (Hematology and Oncology)   ASSESSMENT & PLAN:   Assessment:  1.  Duodenal neuroendocrine tumor: - 04/27/2021 (duodenal mass biopsy): Well-differentiated carcinoid tumor, grade 1, Ki-67 1%. - Dotatate PET (05/2021): No focal activity within the duodenum or evidence of metastatic disease. - EGD/EUS (08/2021): Submucosal nodule found in the duodenum-consistent with previously biopsied duodenal NET.  An intramural subepithelial lesion found in the second part of the duodenum.  Lesion appears to originate from within the deep mucosa and submucosa.  She was not felt to be candidate for EMR. - Patient had traumatic experience after the EUS and did not want to pursue further options and declined a referral to [facility]. - Denies any diarrhea/flushing/wheezing/abdominal pain.  No obstructive symptoms.  2.  Normocytic anemia: - More than 5-year history of iron deficiency anemia secondary to GI bleed.  She underwent multiple EGDs and colonoscopies and 2 small bowel capsule endoscopies. - She reports taking iron tablet daily for the past 3 years.  Denies any ice pica. - Denies any BRBPR/melena.  She had blood transfusion 2 weeks ago.  3.  Social/family history: - Lives at home with her husband.  Independent of ADLs and IADLs.  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  No chemical exposure.  Smoked half pack per day starting  at age 36 and quit in 2005. - Brother had colon cancer.  Older brother had liver cancer.  Plan:  1.  Normocytic anemia: - Multifactorial anemia from CKD, iron deficiency, chronic blood loss. - She has been receiving Retacrit 6000 units every 4 weeks. - Will repeat her CBC today and check her for nutritional deficiencies, hemolysis and bone marrow infiltrative process. - If she has low ferritin, will consider parenteral iron therapy. - RTC 2 weeks for follow-up.  2.  Duodenal carcinoid tumor: - She does not have any signs or symptoms of carcinoid syndrome or obstruction. - Recommend chromogranin a level and 24-hour urine for 5-HIAA. - Recommend dotatate PET scan. - If there is any worsening, will consider Sandostatin/octreotide injections.   Orders Placed This Encounter  Procedures   NM PET DOTATATE SKULL BASE TO MID THIGH    Standing Status:   Future    Standing Expiration Date:   11/16/2023    Order Specific Question:   If indicated for the ordered procedure, I authorize the administration of a radiopharmaceutical per Radiology protocol    Answer:   Yes    Order Specific Question:   Preferred imaging location?    Answer:   [facility]    Order Specific Question:   Release to patient    Answer:   Immediate   CBC with Differential    Standing Status:   Future    Number of Occurrences:   1    Standing Expiration Date:   11/16/2023   Ferritin    Standing Status:   Future    Number of Occurrences:   1    Standing Expiration Date:   11/16/2023  Iron and TIBC ([facility] ONLY)    Standing Status:   Future    Number of Occurrences:   1    Standing Expiration Date:   11/16/2023   Lactate dehydrogenase    Standing Status:   Future    Number of Occurrences:   1    Standing Expiration Date:   11/16/2023   Reticulocytes    Standing Status:   Future    Number of Occurrences:   1    Standing Expiration Date:   11/16/2023   Methylmalonic acid, serum    Standing Status:    Future    Number of Occurrences:   1    Standing Expiration Date:   11/16/2023   Copper, serum    Standing Status:   Future    Number of Occurrences:   1    Standing Expiration Date:   11/16/2023   Vitamin B12    Standing Status:   Future    Number of Occurrences:   1    Standing Expiration Date:   11/16/2023   Folate    Standing Status:   Future    Number of Occurrences:   1    Standing Expiration Date:   11/16/2023   Chromogranin A    Standing Status:   Future    Number of Occurrences:   1    Standing Expiration Date:   11/16/2023   5 HIAA, quantitative, urine, 24 hour    Standing Status:   Future    Standing Expiration Date:   11/16/2023   Protein electrophoresis, serum    Standing Status:   Future    Number of Occurrences:   1    Standing Expiration Date:   11/16/2023   Kappa/lambda light chains    Standing Status:   Future    Number of Occurrences:   1    Standing Expiration Date:   11/16/2023   Immunofixation electrophoresis    Standing Status:   Future    Number of Occurrences:   1    Standing Expiration Date:   11/16/2023   Direct antiglobulin test    Standing Status:   Future    Number of Occurrences:   1    Standing Expiration Date:   11/16/2023   Sample to Blood Bank(Blood Bank Hold)    Standing Status:   Future    Number of Occurrences:   1    Standing Expiration Date:   11/16/2023    Order Specific Question:   Release to patient    Answer:   Immediate      I,Katie Daubenspeck,acting as a scribe for Doreatha Massed, MD.,have documented all relevant documentation on the behalf of Doreatha Massed, MD,as directed by  Doreatha Massed, MD while in the presence of Doreatha Massed, MD.   I, Doreatha Massed MD, have reviewed the above documentation for accuracy and completeness, and I agree with the above.   Doreatha Massed, MD   5/17/20242:45 PM  CHIEF COMPLAINT/PURPOSE OF CONSULT:   Diagnosis: history of NET and anemia   Cancer Staging   No matching staging information was found for the patient.   Prior Therapy: none  Current Therapy:  retacrit injections, monthly   HISTORY OF PRESENT ILLNESS:   Oncology History   No history exists.      [name] [name] is a 82 y.o. female presenting to clinic today for evaluation of history of NET and anemia at the request of Dr. Wolfgang Phoenix.  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 She has undergone evaluations with EGD's, capsule study, and colonoscopies since 2018. She has had positive hemoccult stool tests in 01/2018, 12/2019, and most recently on 10/24/22. On EGD from 03/22/21 with Dr. Karilyn Cota, she was found to have a 12 mm duodenal lesion, felt to be likely benign. Further evaluation with EUS on 04/27/21 with Dr. Christella Hartigan showed a 1 cm subepithelial lesion. Biopsy from the lesion revealed well-differentiated neuroendocrine tumor, grade 1, with Ki-67 of 1%. Staging DOTATATE PET on 05/19/21 was negative, including no activity within duodenum. She was scheduled for endoscopic resection on 08/17/21 with Dr. Meridee Score, but the lesion was felt to be too deep and the risk too high. Given difficulty with that procedure, as well as her lack of symptoms, she has declined to pursue any further endoscopic procedures.  For her history of anemia and CKD, she is followed by Dr. Wolfgang Phoenix. She has previously received IV Feraheme-- in 10/2016 (x2 doses), 01/2018, 08/2019 (x2 doses), 12/2019, and 12/2020 (x2 doses). She has also been receiving monthly retacrit injections since 12/2020. She was recently found to have a hemoglobin of 7.1 and was referred to the ED. She was admitted and given 1u pRBC and IV fluids.  Today, she states that she is doing well overall. Her appetite level is at 100%. Her energy level is at 70%.  PAST MEDICAL HISTORY:   Past Medical History: Past Medical History:  Diagnosis Date   A-fib (HCC)    Blood transfusion without reported diagnosis    CAD  (coronary artery disease)    Multivessel status post CABG October 2005   Carotid artery disease (HCC)    CKD (chronic kidney disease), stage III (HCC)    Essential hypertension    Glaucoma    Laser surgery 2010   Hyperlipidemia    Hypothyroidism    Iron deficiency anemia    Obesity     Surgical History: Past Surgical History:  Procedure Laterality Date   BIOPSY  04/27/2021   Procedure: BIOPSY;  Surgeon: Rachael Fee, MD;  Location: [facility];  Service: Endoscopy;;   COLONOSCOPY N/A 02/04/2013   Procedure: COLONOSCOPY;  Surgeon: Malissa Hippo, MD;  Location: [facility];  Service: Endoscopy;  Laterality: N/A;  1015   COLONOSCOPY N/A 12/21/2016   Procedure: COLONOSCOPY;  Surgeon: Malissa Hippo, MD;  [facility];  Service: Endoscopy;  Laterality: N/A;   COLONOSCOPY WITH PROPOFOL N/A 03/22/2021   Procedure: COLONOSCOPY WITH PROPOFOL;  Surgeon: Malissa Hippo, MD;  [facility]: [facility];  Service: Endoscopy;  Laterality: N/A;  8:15   CORONARY ARTERY BYPASS GRAFT  October 2005   Dr. Cornelius Moras - LIMA to LAD, SVG to OM 2 and OM 3, SVG to PDA   ESOPHAGOGASTRODUODENOSCOPY N/A 12/21/2016   Procedure: ESOPHAGOGASTRODUODENOSCOPY (EGD);  Surgeon: Malissa Hippo, MD;  [facility];  Service: Endoscopy;  Laterality: N/A;  11:10   ESOPHAGOGASTRODUODENOSCOPY N/A 02/27/2018   Procedure: ESOPHAGOGASTRODUODENOSCOPY (EGD);  Surgeon: Malissa Hippo, MD;  [facility];  Service: Endoscopy;  Laterality: N/A;   ESOPHAGOGASTRODUODENOSCOPY (EGD) WITH PROPOFOL N/A 04/27/2021   Procedure: ESOPHAGOGASTRODUODENOSCOPY (EGD) WITH PROPOFOL;  Surgeon: Rachael Fee, MD;  Location: [facility];  Service: Endoscopy;  Laterality: N/A;   ESOPHAGOGASTRODUODENOSCOPY (EGD) WITH PROPOFOL N/A 08/17/2021   Procedure: ESOPHAGOGASTRODUODENOSCOPY (EGD) WITH PROPOFOL;  Surgeon: Meridee Score Netty Starring., MD;  Location: [facility] ENDOSCOPY;  Service: Gastroenterology;  Laterality: N/A;  EUS  N/A 04/27/2021   Procedure: UPPER ENDOSCOPIC ULTRASOUND (EUS) RADIAL;  Surgeon: Rachael Fee, MD;  Location: [facility];  Service: Endoscopy;  Laterality: N/A;   EYE SURGERY  2010   laser treatment to both eyes for glaucoma   GIVENS CAPSULE STUDY N/A 03/07/2018   Procedure: GIVENS CAPSULE STUDY;  Surgeon: Malissa Hippo, MD;  [facility];  Service: Endoscopy;  Laterality: N/A;   GIVENS CAPSULE STUDY N/A 02/05/2020   Procedure: GIVENS CAPSULE STUDY;  Surgeon: Malissa Hippo, MD;  [facility];  Service: Endoscopy;  Laterality: N/A;  730   POLYPECTOMY  03/22/2021   Procedure: POLYPECTOMY;  Surgeon: Malissa Hippo, MD;  [facility];  Service: Endoscopy;;   UPPER ESOPHAGEAL ENDOSCOPIC ULTRASOUND (EUS) N/A 08/17/2021   Procedure: UPPER ESOPHAGEAL ENDOSCOPIC ULTRASOUND (EUS);  Surgeon: Lemar Lofty., MD;  Location: [facility] ENDOSCOPY;  Service: Gastroenterology;  Laterality: N/A;    Social History: Social History   Socioeconomic History   Marital status: Married    Spouse name: Not on file   Number of children: Not on file   Years of education: Not on file   Highest education level: Not on file  Occupational History   Occupation: retired   Tobacco Use   Smoking status: Former    Packs/day: 0.25    Years: 40.00    Additional pack years: 0.00    Total pack years: 10.00    Types: Cigarettes    Quit date: 07/03/2003    Years since quitting: 19.3    Passive exposure: Never   Smokeless tobacco: Never  Vaping Use   Vaping Use: Never used  Substance and Sexual Activity   Alcohol use: No    Alcohol/week: 0.0 standard drinks of alcohol   Drug use: No   Sexual activity: Never  Other Topics Concern   Not on file  Social History Narrative   Not on file   Social Determinants of Health   Financial Resource Strain: Low Risk  (12/11/2021)   Overall Financial Resource Strain (CARDIA)    Difficulty of Paying Living Expenses: Not hard at all  Food  Insecurity: No Food Insecurity (10/24/2022)   Hunger Vital Sign    Worried About Running Out of Food in the Last Year: Never true    Ran Out of Food in the Last Year: Never true  Transportation Needs: No Transportation Needs (10/24/2022)   PRAPARE - Administrator, Civil Service (Medical): No    Lack of Transportation (Non-Medical): No  Physical Activity: Insufficiently Active (12/11/2021)   Exercise Vital Sign    Days of Exercise per Week: 5 days    Minutes of Exercise per Session: 10 min  Stress: No Stress Concern Present (12/11/2021)   [employer] of Occupational Health - Occupational Stress Questionnaire    Feeling of Stress : Not at all  Social Connections: Moderately Integrated (12/11/2021)   Social Connection and Isolation Panel [NHANES]    Frequency of Communication with Friends and Family: More than three times a week    Frequency of Social Gatherings with Friends and Family: More than three times a week    Attends Religious Services: More than 4 times per year    Active Member of [employer] or Organizations: No    Attends Banker Meetings: Never    Marital Status: Married  Catering manager Violence: Not At Risk (10/24/2022)   Humiliation, Afraid, Rape, and Kick questionnaire    Fear  of Current or Ex-Partner: No    Emotionally Abused: No    Physically Abused: No    Sexually Abused: No    Family History: Family History  Problem Relation Age of Onset   Aneurysm Mother    Hypertension Mother    Coronary artery disease Mother    Coronary artery disease Father    Diabetes Sister    Coronary artery disease Sister    Diverticulosis Sister    COPD Sister    Kidney disease Sister    Diabetes Sister    Hypertension Sister    Breast cancer Sister 24   Hearing loss Sister    Heart Problems Sister        pacemaker   Colon cancer Brother 64       lived 18 months afterwards   Liver cancer Brother 46   Diabetes Son    Pancreatic cancer Son     Esophageal cancer Neg Hx    Inflammatory bowel disease Neg Hx    Stomach cancer Neg Hx     Current Medications:  Current Outpatient Medications:    amLODipine (NORVASC) 5 MG tablet, TAKE ONE TABLET BY MOUTH TWICE DAILY FOR BLOOD PRESSURE (Patient taking differently: Take 5 mg by mouth daily.), Disp: 90 tablet, Rfl: 5   cetirizine (ZYRTEC) 10 MG tablet, TAKE ONE TABLET BY MOUTH DAILY, Disp: 90 tablet, Rfl: 1   famotidine (PEPCID) 20 MG tablet, Take 1 tablet (20 mg total) by mouth 2 (two) times daily., Disp: 60 tablet, Rfl: 1   ferrous sulfate 325 (65 FE) MG tablet, Take 325 mg by mouth daily with breakfast., Disp: , Rfl:    fish oil-omega-3 fatty acids 1000 MG capsule, Take 1 g by mouth daily., Disp: , Rfl:    fluticasone (FLONASE) 50 MCG/ACT nasal spray, USE 2 SPRAYS IN EACH NOSTRIL ONCE DAILY. SHAKE GENTLY BEFORE USING., Disp: 48 g, Rfl: 1   hydrALAZINE (APRESOLINE) 100 MG tablet, Take one tablet by mouth once daily, Disp: 90 tablet, Rfl: 3   latanoprost (XALATAN) 0.005 % ophthalmic solution, Place 1 drop into both eyes at bedtime., Disp: , Rfl:    levothyroxine (SYNTHROID) 100 MCG tablet, TAKE ONE TABLET BY MOUTH ONCE DAILY, Disp: 90 tablet, Rfl: 1   metoprolol succinate (TOPROL-XL) 50 MG 24 hr tablet, Take 1 tablet (50 mg total) by mouth daily. Take with or immediately following a meal., Disp: 90 tablet, Rfl: 1   nitroGLYCERIN (NITROSTAT) 0.4 MG SL tablet, DISSOLVE ONE TABLET UNDER TONGUE EVERY 5 MINUTES UP TO 3 DOSES AS NEEDED FOR CHEST PAIN, Disp: 25 tablet, Rfl: 3   Polyvinyl Alcohol-Povidone (REFRESH OP), Place 1 drop into both eyes at bedtime., Disp: , Rfl:    Rivaroxaban (XARELTO) 15 MG TABS tablet, Take 1 tablet (15 mg total) by mouth daily with supper., Disp: 21 tablet, Rfl: 0   rosuvastatin (CRESTOR) 10 MG tablet, TAKE ONE TABLET BY MOUTH DAILY., Disp: 90 tablet, Rfl: 3   SYSTANE ULTRA 0.4-0.3 % SOLN, Place 1 drop into both eyes daily as needed (dry eyes)., Disp: , Rfl:     telmisartan (MICARDIS) 20 MG tablet, Take 10 mg by mouth daily., Disp: , Rfl:    trolamine salicylate (ASPERCREME) 10 % cream, Apply 1 application topically as needed for muscle pain., Disp: , Rfl:    UNABLE TO FIND, Shower chair x 1  DX M54.36, Disp: 1 each, Rfl: 0   UNABLE TO FIND, Tdap x 1 fax vaccine record to [phone], Disp: 1 each,  Rfl: 0   Allergies: Allergies  Allergen Reactions   Tramadol Itching   Lisinopril Cough   Shrimp (Diagnostic)     Gout flares   Clonidine Derivatives Other (See Comments)    Zombie BP dropped    REVIEW OF SYSTEMS:   Review of Systems  Constitutional:  Negative for chills, fatigue and fever.  HENT:   Negative for lump/mass, mouth sores, nosebleeds, sore throat and trouble swallowing.   Eyes:  Negative for eye problems.  Respiratory:  Negative for cough and shortness of breath.   Cardiovascular:  Negative for chest pain, leg swelling and palpitations.  Gastrointestinal:  Negative for abdominal pain, constipation, diarrhea, nausea and vomiting.  Genitourinary:  Negative for bladder incontinence, difficulty urinating, dysuria, frequency, hematuria and nocturia.   Musculoskeletal:  Negative for arthralgias, back pain, flank pain, myalgias and neck pain.  Skin:  Negative for itching and rash.  Neurological:  Positive for numbness. Negative for dizziness and headaches.  Hematological:  Does not bruise/bleed easily.  Psychiatric/Behavioral:  Negative for depression, sleep disturbance and suicidal ideas. The patient is not nervous/anxious.   All other systems reviewed and are negative.    VITALS:   Blood pressure 138/83, pulse 67, temperature 97.7 F (36.5 C), temperature source Oral, resp. rate 18, height 5\' 1"  (1.549 m), weight 151 lb 1.6 oz (68.5 kg), SpO2 100 %.  Wt Readings from Last 3 Encounters:  11/16/22 151 lb 1.6 oz (68.5 kg)  10/24/22 149 lb (67.6 kg)  10/16/22 150 lb 11.2 oz (68.4 kg)    Body mass index is 28.55  kg/m.  Performance status (ECOG): 1 - Symptomatic but completely ambulatory  PHYSICAL EXAM:   Physical Exam Vitals and nursing note reviewed. Exam conducted with a chaperone present.  Constitutional:      Appearance: Normal appearance.  Cardiovascular:     Rate and Rhythm: Normal rate and regular rhythm.     Pulses: Normal pulses.     Heart sounds: Normal heart sounds.  Pulmonary:     Effort: Pulmonary effort is normal.     Breath sounds: Normal breath sounds.  Abdominal:     Palpations: Abdomen is soft. There is no hepatomegaly, splenomegaly or mass.     Tenderness: There is no abdominal tenderness.  Musculoskeletal:     Right lower leg: No edema.     Left lower leg: No edema.  Lymphadenopathy:     Cervical: No cervical adenopathy.     Right cervical: No superficial, deep or posterior cervical adenopathy.    Left cervical: No superficial, deep or posterior cervical adenopathy.     Upper Body:     Right upper body: No supraclavicular or axillary adenopathy.     Left upper body: No supraclavicular or axillary adenopathy.  Neurological:     General: No focal deficit present.     Mental Status: She is alert and oriented to person, place, and time.  Psychiatric:        Mood and Affect: Mood normal.        Behavior: Behavior normal.     LABS:      Latest Ref Rng & Units 11/16/2022   11:39 AM 11/07/2022    1:02 PM 10/25/2022    3:55 AM  CBC  WBC 4.0 - 10.5 K/uL 5.6   4.9   Hemoglobin 12.0 - 15.0 g/dL 8.8  8.1  8.1   Hematocrit 36.0 - 46.0 % 27.9   25.5   Platelets 150 - 400 K/uL 247  221       Latest Ref Rng & Units 10/25/2022    3:55 AM 10/24/2022    2:03 PM 09/19/2022    1:20 PM  CMP  Glucose 70 - 99 mg/dL 78  99    BUN 8 - 23 mg/dL 40  42    Creatinine 1.61 - 1.00 mg/dL 0.96  0.45    Sodium 409 - 145 mmol/L 137  132    Potassium 3.5 - 5.1 mmol/L 3.9  4.4    Chloride 98 - 111 mmol/L 112  107    CO2 22 - 32 mmol/L 17  18    Calcium 8.9 - 10.3 mg/dL 9.8  9.4     Total Protein 6.5 - 8.1 g/dL  7.3  7.6   Total Bilirubin 0.3 - 1.2 mg/dL  0.5  0.3   Alkaline Phos 38 - 126 U/L  76  81   AST 15 - 41 U/L  22  21   ALT 0 - 44 U/L  15  14      No results found for: "CEA1", "CEA" / No results found for: "CEA1", "CEA" No results found for: "PSA1" No results found for: "WJX914" No results found for: "CAN125"  No results found for: "TOTALPROTELP", "ALBUMINELP", "A1GS", "A2GS", "BETS", "BETA2SER", "GAMS", "MSPIKE", "SPEI" Lab Results  Component Value Date   TIBC 405 11/16/2022   TIBC 416 06/06/2022   TIBC 408 10/24/2021   FERRITIN 18 11/16/2022   FERRITIN 13 06/06/2022   FERRITIN 18 10/24/2021   IRONPCTSAT 72 (H) 11/16/2022   IRONPCTSAT 32 (H) 06/06/2022   IRONPCTSAT 40 (H) 10/24/2021   Lab Results  Component Value Date   LDH 152 11/16/2022     STUDIES:   ECHOCARDIOGRAM COMPLETE  Result Date: 11/01/2022    ECHOCARDIOGRAM REPORT   Patient Name:   AVIS BLOMGREN Date of Exam: 11/01/2022 Medical Rec #:  [id number]       Height:       61.0 in Accession #:    [id number]      Weight:       149.0 lb Date of Birth:  28-Dec-1940      BSA:          1.667 m Patient Age:    81 years        BP:           107/68 mmHg Patient Gender: F               HR:           78 bpm. Exam Location:  [place] Procedure: 2D Echo, Cardiac Doppler and Color Doppler Indications:    I48.91* Unspeicified atrial fibrillation  History:        Patient has prior history of Echocardiogram examinations, most                 recent 10/25/2020. CAD, Anemia, CKD, Carotid Disease and                 Pulmonary HTN, Aortic Valve Disease, Arrythmias:Atrial                 Fibrillation; Risk Factors:Hypertension, Dyslipidemia and Former                 Smoker.  Sonographer:    Jake Seats RDMS, RVT, RDCS Referring Phys: [id number] [facility]  Sonographer Comments: Global longitudinal strain was attempted. IMPRESSIONS  1. Left ventricular ejection fraction, by estimation, is 70 to 75%.  The left  ventricle has hyperdynamic function. The left ventricle has no regional wall motion abnormalities. There is mild concentric left ventricular hypertrophy. Left ventricular diastolic parameters are indeterminate. The average left ventricular global longitudinal strain is -19.5 %. The global longitudinal strain is normal.  2. Right ventricular systolic function is normal. The right ventricular size is normal. There is normal pulmonary artery systolic pressure. The estimated right ventricular systolic pressure is 31.1 mmHg.  3. Left atrial size was severely dilated.  4. Coronary sinus dilated.  5. Right atrial size was mildly dilated.  6. A small pericardial effusion is present. The pericardial effusion is posterior to the left ventricle.  7. The mitral valve is grossly normal. Mild mitral valve regurgitation.  8. The aortic valve is tricuspid. There is mild calcification of the aortic valve. Aortic valve regurgitation is trivial. Aortic valve sclerosis/calcification is present, without any evidence of aortic stenosis. Aortic valve mean gradient measures 7.5 mmHg.  9. The inferior vena cava is normal in size with greater than 50% respiratory variability, suggesting right atrial pressure of 3 mmHg. Comparison(s): Prior images reviewed side by side. LVEF vigorous at 70-75%. Normal RV contraction and estimated RVSP. Severely dilated left atrium. FINDINGS  Left Ventricle: Left ventricular ejection fraction, by estimation, is 70 to 75%. The left ventricle has hyperdynamic function. The left ventricle has no regional wall motion abnormalities. The average left ventricular global longitudinal strain is -19.5  %. The global longitudinal strain is normal. The left ventricular internal cavity size was normal in size. There is mild concentric left ventricular hypertrophy. Left ventricular diastolic function could not be evaluated due to atrial fibrillation. Left  ventricular diastolic parameters are indeterminate. Right Ventricle:  The right ventricular size is normal. No increase in right ventricular wall thickness. Right ventricular systolic function is normal. There is normal pulmonary artery systolic pressure. The tricuspid regurgitant velocity is 2.65 m/s, and  with an assumed right atrial pressure of 3 mmHg, the estimated right ventricular systolic pressure is 31.1 mmHg. Left Atrium: Left atrial size was severely dilated. Right Atrium: Right atrial size was mildly dilated. Pericardium: A small pericardial effusion is present. The pericardial effusion is posterior to the left ventricle. Mitral Valve: The mitral valve is grossly normal. Mild mitral valve regurgitation. Tricuspid Valve: The tricuspid valve is grossly normal. Tricuspid valve regurgitation is mild. Aortic Valve: The aortic valve is tricuspid. There is mild calcification of the aortic valve. There is mild aortic valve annular calcification. Aortic valve regurgitation is trivial. Aortic valve sclerosis/calcification is present, without any evidence of aortic stenosis. Aortic valve mean gradient measures 7.5 mmHg. Aortic valve peak gradient measures 15.1 mmHg. Aortic valve area, by VTI measures 1.57 cm. Pulmonic Valve: The pulmonic valve was grossly normal. Pulmonic valve regurgitation is trivial. Aorta: The aortic root is normal in size and structure. Venous: The inferior vena cava is normal in size with greater than 50% respiratory variability, suggesting right atrial pressure of 3 mmHg. IAS/Shunts: No atrial level shunt detected by color flow Doppler.  LEFT VENTRICLE PLAX 2D LVIDd:         5.20 cm     Diastology LVIDs:         2.50 cm     LV e' medial:    8.38 cm/s LV PW:         1.10 cm     LV E/e' medial:  16.9 LV IVS:        1.00 cm     LV e' lateral:  9.36 cm/s LVOT diam:     1.70 cm     LV E/e' lateral: 15.2 LV SV:         68 LV SV Index:   41          2D Longitudinal Strain LVOT Area:     2.27 cm    2D Strain GLS (A2C):   -17.9 %                            2D Strain  GLS (A3C):   -23.4 %                            2D Strain GLS (A4C):   -17.2 % LV Volumes (MOD)           2D Strain GLS Avg:     -19.5 % LV vol d, MOD A2C: 58.5 ml LV vol d, MOD A4C: 48.5 ml LV vol s, MOD A2C: 15.7 ml LV vol s, MOD A4C: 11.9 ml LV SV MOD A2C:     42.8 ml LV SV MOD A4C:     48.5 ml LV SV MOD BP:      39.9 ml RIGHT VENTRICLE RV S prime:     9.06 cm/s TAPSE (M-mode): 1.5 cm LEFT ATRIUM              Index        RIGHT ATRIUM           Index LA diam:        5.30 cm  3.18 cm/m   RA Area:     19.10 cm LA Vol (A2C):   82.9 ml  49.74 ml/m  RA Volume:   51.30 ml  30.78 ml/m LA Vol (A4C):   105.0 ml 63.00 ml/m LA Biplane Vol: 95.3 ml  57.18 ml/m  AORTIC VALVE AV Area (Vmax):    1.56 cm AV Area (Vmean):   1.64 cm AV Area (VTI):     1.57 cm AV Vmax:           194.00 cm/s AV Vmean:          129.000 cm/s AV VTI:            0.432 m AV Peak Grad:      15.1 mmHg AV Mean Grad:      7.5 mmHg LVOT Vmax:         133.50 cm/s LVOT Vmean:        93.150 cm/s LVOT VTI:          0.300 m LVOT/AV VTI ratio: 0.69 AR Vena Contracta: 0.30 cm  AORTA Ao Root diam: 3.00 cm Ao Asc diam:  2.90 cm MITRAL VALVE                TRICUSPID VALVE MV Area (PHT): 3.93 cm     TR Peak grad:   28.1 mmHg MV Decel Time: 193 msec     TR Vmax:        265.00 cm/s MR Peak grad: 36.4 mmHg MR Vmax:      301.50 cm/s   SHUNTS MV E velocity: 142.00 cm/s  Systemic VTI:  0.30 m                             Systemic Diam: 1.70 cm Nona Dell MD Electronically signed by Remi Deter  Mcdowell MD Signature Date/Time: 11/01/2022/3:12:18 PM    Final    LONG TERM MONITOR (3-14 DAYS)  Result Date: 10/26/2022 ZIO monitor reviewed.  13 days, 4 hours analyzed. Predominant rhythm is atrial fibrillation with heart rate ranging from 39 bpm up to 148 bpm and average heart rate 69 bpm. Occasional PVCs were noted representing 3.2% total beats.  Also brief episodes of ventricular bigeminy and trigeminy. Two brief episodes of probable aberrant atrial fibrillation with  RVR noted.  QRS appears different from PVC morphology so doubt NSVT. No pauses or high degree heart block.

## 2022-11-16 ENCOUNTER — Inpatient Hospital Stay: Payer: Medicare Other

## 2022-11-16 ENCOUNTER — Inpatient Hospital Stay: Payer: Medicare Other | Attending: Hematology | Admitting: Hematology

## 2022-11-16 VITALS — BP 138/83 | HR 67 | Temp 97.7°F | Resp 18 | Ht 61.0 in | Wt 151.1 lb

## 2022-11-16 DIAGNOSIS — N1832 Chronic kidney disease, stage 3b: Secondary | ICD-10-CM

## 2022-11-16 DIAGNOSIS — D631 Anemia in chronic kidney disease: Secondary | ICD-10-CM | POA: Diagnosis not present

## 2022-11-16 DIAGNOSIS — I2510 Atherosclerotic heart disease of native coronary artery without angina pectoris: Secondary | ICD-10-CM | POA: Diagnosis not present

## 2022-11-16 DIAGNOSIS — Z79899 Other long term (current) drug therapy: Secondary | ICD-10-CM | POA: Insufficient documentation

## 2022-11-16 DIAGNOSIS — D3A010 Benign carcinoid tumor of the duodenum: Secondary | ICD-10-CM

## 2022-11-16 DIAGNOSIS — Z825 Family history of asthma and other chronic lower respiratory diseases: Secondary | ICD-10-CM | POA: Diagnosis not present

## 2022-11-16 DIAGNOSIS — Z8249 Family history of ischemic heart disease and other diseases of the circulatory system: Secondary | ICD-10-CM | POA: Insufficient documentation

## 2022-11-16 DIAGNOSIS — R200 Anesthesia of skin: Secondary | ICD-10-CM | POA: Insufficient documentation

## 2022-11-16 DIAGNOSIS — Z822 Family history of deafness and hearing loss: Secondary | ICD-10-CM | POA: Diagnosis not present

## 2022-11-16 DIAGNOSIS — Z87891 Personal history of nicotine dependence: Secondary | ICD-10-CM | POA: Diagnosis not present

## 2022-11-16 DIAGNOSIS — I4891 Unspecified atrial fibrillation: Secondary | ICD-10-CM | POA: Diagnosis not present

## 2022-11-16 DIAGNOSIS — E785 Hyperlipidemia, unspecified: Secondary | ICD-10-CM | POA: Diagnosis not present

## 2022-11-16 DIAGNOSIS — Z800 Family history of malignant neoplasm of digestive organs: Secondary | ICD-10-CM | POA: Insufficient documentation

## 2022-11-16 DIAGNOSIS — Z841 Family history of disorders of kidney and ureter: Secondary | ICD-10-CM

## 2022-11-16 DIAGNOSIS — D509 Iron deficiency anemia, unspecified: Secondary | ICD-10-CM

## 2022-11-16 DIAGNOSIS — Z803 Family history of malignant neoplasm of breast: Secondary | ICD-10-CM | POA: Diagnosis not present

## 2022-11-16 DIAGNOSIS — E039 Hypothyroidism, unspecified: Secondary | ICD-10-CM | POA: Diagnosis not present

## 2022-11-16 DIAGNOSIS — C7A010 Malignant carcinoid tumor of the duodenum: Secondary | ICD-10-CM | POA: Diagnosis not present

## 2022-11-16 DIAGNOSIS — Z833 Family history of diabetes mellitus: Secondary | ICD-10-CM

## 2022-11-16 LAB — SAMPLE TO BLOOD BANK

## 2022-11-16 LAB — CBC WITH DIFFERENTIAL/PLATELET
Abs Immature Granulocytes: 0.04 10*3/uL (ref 0.00–0.07)
Basophils Absolute: 0 10*3/uL (ref 0.0–0.1)
Basophils Relative: 1 %
Eosinophils Absolute: 0.4 10*3/uL (ref 0.0–0.5)
Eosinophils Relative: 7 %
HCT: 27.9 % — ABNORMAL LOW (ref 36.0–46.0)
Hemoglobin: 8.8 g/dL — ABNORMAL LOW (ref 12.0–15.0)
Immature Granulocytes: 1 %
Lymphocytes Relative: 17 %
Lymphs Abs: 1 10*3/uL (ref 0.7–4.0)
MCH: 30.7 pg (ref 26.0–34.0)
MCHC: 31.5 g/dL (ref 30.0–36.0)
MCV: 97.2 fL (ref 80.0–100.0)
Monocytes Absolute: 0.5 10*3/uL (ref 0.1–1.0)
Monocytes Relative: 9 %
Neutro Abs: 3.7 10*3/uL (ref 1.7–7.7)
Neutrophils Relative %: 65 %
Platelets: 247 10*3/uL (ref 150–400)
RBC: 2.87 MIL/uL — ABNORMAL LOW (ref 3.87–5.11)
RDW: 14.7 % (ref 11.5–15.5)
WBC: 5.6 10*3/uL (ref 4.0–10.5)
nRBC: 0 % (ref 0.0–0.2)

## 2022-11-16 LAB — LACTATE DEHYDROGENASE: LDH: 152 U/L (ref 98–192)

## 2022-11-16 LAB — IRON AND TIBC
Iron: 290 ug/dL — ABNORMAL HIGH (ref 28–170)
Saturation Ratios: 72 % — ABNORMAL HIGH (ref 10.4–31.8)
TIBC: 405 ug/dL (ref 250–450)
UIBC: 115 ug/dL

## 2022-11-16 LAB — VITAMIN B12: Vitamin B-12: 241 pg/mL (ref 180–914)

## 2022-11-16 LAB — DIRECT ANTIGLOBULIN TEST (NOT AT ARMC)
DAT, IgG: NEGATIVE
DAT, complement: NEGATIVE

## 2022-11-16 LAB — RETICULOCYTES
Immature Retic Fract: 18.6 % — ABNORMAL HIGH (ref 2.3–15.9)
RBC.: 2.88 MIL/uL — ABNORMAL LOW (ref 3.87–5.11)
Retic Count, Absolute: 101.7 10*3/uL (ref 19.0–186.0)
Retic Ct Pct: 3.5 % — ABNORMAL HIGH (ref 0.4–3.1)

## 2022-11-16 LAB — FERRITIN: Ferritin: 18 ng/mL (ref 11–307)

## 2022-11-16 LAB — FOLATE: Folate: 7.8 ng/mL (ref >5.9)

## 2022-11-16 NOTE — Patient Instructions (Addendum)
[facility]  Discharge Instructions  You were seen and examined today by Dr. Ellin Saba. Dr. Ellin Saba is a hematologist, meaning that he specializes in blood abnormalities. Dr. Ellin Saba discussed your past medical history, family history of cancers/blood conditions and the events that led to you being here today.  You were referred to Dr. Ellin Saba due to anemia.  Dr. Ellin Saba has recommended additional labs today for further evaluation.  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 Dr. Ellin Saba will do a PET scan and lab work, including urine test, for this.  Follow-up as scheduled.  Thank you for choosing [facility] to provide your oncology and hematology care.   To afford each patient quality time with our provider, please arrive at least 15 minutes before your scheduled appointment time. You may need to reschedule your appointment if you arrive late (10 or more minutes). Arriving late affects you and other patients whose appointments are after yours.  Also, if you miss three or more appointments without notifying the office, you may be dismissed from the clinic at the provider's discretion.    Again, thank you for choosing [facility].  Our hope is that these requests will decrease the amount of time that you wait before being seen by our physicians.   If you have a lab appointment with the Cancer Center - please note that after April 8th, all labs will be drawn in the cancer center.  You do not have to check in or register with the main entrance as you have in the past but will complete your check-in at the cancer center.            _____________________________________________________________  Should you have questions after your visit to [facility], please contact our office at [phone] and follow the prompts.  Our office hours are 8:00 a.m. to 4:30 p.m. Monday - Thursday and  8:00 a.m. to 2:30 p.m. Friday.  Please note that voicemails left after 4:00 p.m. may not be returned until the following business day.  We are closed weekends and all major holidays.  You do have access to a nurse 24-7, just call the main number to the clinic [phone] and do not press any options, hold on the line and a nurse will answer the phone.    For prescription refill requests, have your pharmacy contact our office and allow 72 hours.    Masks are no longer required in the cancer centers. If you would like for your care team to wear a mask while they are taking care of you, please let them know. You may have one support person who is at least 81 years old accompany you for your appointments.

## 2022-11-19 ENCOUNTER — Encounter: Payer: Self-pay | Admitting: Hematology

## 2022-11-19 ENCOUNTER — Encounter (HOSPITAL_COMMUNITY): Payer: Self-pay | Admitting: Nephrology

## 2022-11-19 LAB — COPPER, SERUM: Copper: 118 ug/dL (ref 80–158)

## 2022-11-19 LAB — METHYLMALONIC ACID, SERUM: Methylmalonic Acid, Quantitative: 201 nmol/L (ref 0–378)

## 2022-11-19 LAB — CHROMOGRANIN A: Chromogranin A (ng/mL): 847 ng/mL — ABNORMAL HIGH (ref 0.0–101.8)

## 2022-11-20 ENCOUNTER — Telehealth: Payer: Self-pay | Admitting: Family Medicine

## 2022-11-20 ENCOUNTER — Ambulatory Visit: Payer: Medicare Other | Admitting: Nurse Practitioner

## 2022-11-20 ENCOUNTER — Other Ambulatory Visit: Payer: Self-pay

## 2022-11-20 LAB — KAPPA/LAMBDA LIGHT CHAINS
Kappa free light chain: 56 mg/L — ABNORMAL HIGH (ref 3.3–19.4)
Kappa, lambda light chain ratio: 1.55 (ref 0.26–1.65)
Lambda free light chains: 36.1 mg/L — ABNORMAL HIGH (ref 5.7–26.3)

## 2022-11-20 LAB — PROTEIN ELECTROPHORESIS, SERUM
A/G Ratio: 1 (ref 0.7–1.7)
Albumin ELP: 3.5 g/dL (ref 2.9–4.4)
Alpha-1-Globulin: 0.3 g/dL (ref 0.0–0.4)
Alpha-2-Globulin: 0.7 g/dL (ref 0.4–1.0)
Beta Globulin: 1 g/dL (ref 0.7–1.3)
Gamma Globulin: 1.4 g/dL (ref 0.4–1.8)
Globulin, Total: 3.4 g/dL (ref 2.2–3.9)
Total Protein ELP: 6.9 g/dL (ref 6.0–8.5)

## 2022-11-20 MED ORDER — CETIRIZINE HCL 10 MG PO TABS: 10.0000 mg | ORAL_TABLET | Freq: Every day | ORAL | 1 refills | Status: DC

## 2022-11-20 MED ORDER — LEVOTHYROXINE SODIUM 100 MCG PO TABS: 100.0000 ug | ORAL_TABLET | Freq: Every day | ORAL | 1 refills | Status: DC

## 2022-11-20 MED ORDER — HYDRALAZINE HCL 100 MG PO TABS: ORAL_TABLET | ORAL | 3 refills | Status: DC

## 2022-11-20 MED ORDER — ROSUVASTATIN CALCIUM 10 MG PO TABS: 10.0000 mg | ORAL_TABLET | Freq: Every day | ORAL | 3 refills | Status: DC

## 2022-11-20 NOTE — Telephone Encounter (Signed)
Patient called asked if Dr Lodema Hong would send over all her prescriptions to [facility] since [facility] has closed, [facility] can mail them to her, needs a 90 day supply on each medicine if Dr Lodema Hong will approve it. [facility] Fax # [phone] [facility] phone #  [phone]

## 2022-11-20 NOTE — Telephone Encounter (Signed)
Medication sent to [facility] per patient request

## 2022-11-21 ENCOUNTER — Other Ambulatory Visit (HOSPITAL_COMMUNITY)
Admission: RE | Admit: 2022-11-21 | Discharge: 2022-11-21 | Disposition: A | Discharge status: Home / Self Care | Payer: Medicare Other | Source: Ambulatory Visit | Attending: Hematology | Admitting: Hematology

## 2022-11-21 ENCOUNTER — Encounter: Payer: Self-pay | Admitting: Hematology

## 2022-11-21 ENCOUNTER — Encounter (HOSPITAL_COMMUNITY): Payer: Self-pay | Admitting: Nephrology

## 2022-11-21 ENCOUNTER — Encounter (HOSPITAL_COMMUNITY)
Admission: RE | Admit: 2022-11-21 | Discharge: 2022-11-21 | Disposition: A | Discharge status: Home / Self Care | Payer: Medicare Other | Source: Ambulatory Visit | Attending: Family Medicine | Admitting: Family Medicine

## 2022-11-21 ENCOUNTER — Other Ambulatory Visit: Payer: Self-pay | Admitting: *Deleted

## 2022-11-21 DIAGNOSIS — N1832 Chronic kidney disease, stage 3b: Secondary | ICD-10-CM | POA: Insufficient documentation

## 2022-11-21 DIAGNOSIS — D509 Iron deficiency anemia, unspecified: Secondary | ICD-10-CM | POA: Insufficient documentation

## 2022-11-21 DIAGNOSIS — D3A010 Benign carcinoid tumor of the duodenum: Secondary | ICD-10-CM | POA: Insufficient documentation

## 2022-11-21 DIAGNOSIS — D631 Anemia in chronic kidney disease: Secondary | ICD-10-CM | POA: Insufficient documentation

## 2022-11-21 LAB — RENAL FUNCTION PANEL
Albumin: 3.7 g/dL (ref 3.5–5.0)
Anion gap: 6 (ref 5–15)
BUN: 35 mg/dL — ABNORMAL HIGH (ref 8–23)
CO2: 21 mmol/L — ABNORMAL LOW (ref 22–32)
Calcium: 9.9 mg/dL (ref 8.9–10.3)
Chloride: 109 mmol/L (ref 98–111)
Creatinine, Ser: 1.45 mg/dL — ABNORMAL HIGH (ref 0.44–1.00)
GFR, Estimated: 36 mL/min — ABNORMAL LOW (ref >60)
Glucose, Bld: 93 mg/dL (ref 70–99)
Phosphorus: 3.4 mg/dL (ref 2.5–4.6)
Potassium: 3.7 mmol/L (ref 3.5–5.1)
Sodium: 136 mmol/L (ref 135–145)

## 2022-11-21 LAB — PROTEIN / CREATININE RATIO, URINE
Creatinine, Urine: 45 mg/dL
Protein Creatinine Ratio: 0.73 mg/mg{Cre} — ABNORMAL HIGH (ref 0.00–0.15)
Total Protein, Urine: 33 mg/dL

## 2022-11-21 MED ORDER — NITROGLYCERIN 0.4 MG SL SUBL: 0.4000 mg | SUBLINGUAL_TABLET | SUBLINGUAL | 1 refills | Status: DC | PRN

## 2022-11-21 MED ORDER — EPOETIN ALFA-EPBX 10000 UNIT/ML IJ SOLN
6000.0000 [IU] | Freq: Once | INTRAMUSCULAR | Status: AC
  Administered 2022-11-21: 6000 [IU] via SUBCUTANEOUS
  Filled 2022-11-21: qty 1

## 2022-11-21 NOTE — Progress Notes (Signed)
Diagnosis: Anemia in Chronic Kidney Disease  Provider:  Manpreet Bhutani MD  Procedure: Injection  Retacrit (epoetin alfa-epbx), Dose: 6000 units , Site: subcutaneous, Number of injections: 1  Hgb 8.8. Administered in right arm.  Post Care: Patient declined observation  Discharge: Condition: Good, Destination: Home . AVS Provided  Performed by:  Marin Shutter, RN

## 2022-11-21 NOTE — Addendum Note (Signed)
Encounter addended by: Deziree Mokry E, RN on: 11/21/2022 3:20 PM  Actions taken: Therapy plan modified

## 2022-11-22 ENCOUNTER — Other Ambulatory Visit: Payer: Self-pay

## 2022-11-22 LAB — PARATHYROID HORMONE, INTACT (NO CA): PTH: 35 pg/mL (ref 15–65)

## 2022-11-23 LAB — 5 HIAA, QUANTITATIVE, URINE, 24 HOUR
5-HIAA, Ur: 2 mg/L
5-HIAA,Quant.,24 Hr Urine: 4.8 mg/24 hr (ref 0.0–14.9)
Total Volume: 2400

## 2022-11-25 LAB — IMMUNOFIXATION ELECTROPHORESIS
IgA: 159 mg/dL (ref 64–422)
IgG (Immunoglobin G), Serum: 1486 mg/dL (ref 586–1602)
IgM (Immunoglobulin M), Srm: 110 mg/dL (ref 26–217)
Total Protein ELP: 7.2 g/dL (ref 6.0–8.5)

## 2022-11-28 ENCOUNTER — Encounter (HOSPITAL_COMMUNITY): Payer: Medicare Other

## 2022-11-29 ENCOUNTER — Ambulatory Visit (INDEPENDENT_AMBULATORY_CARE_PROVIDER_SITE_OTHER): Payer: Medicare Other | Admitting: Gastroenterology

## 2022-11-29 ENCOUNTER — Encounter (INDEPENDENT_AMBULATORY_CARE_PROVIDER_SITE_OTHER): Payer: Self-pay | Admitting: Gastroenterology

## 2022-11-29 VITALS — Ht 61.0 in | Wt 149.0 lb

## 2022-11-29 DIAGNOSIS — K5900 Constipation, unspecified: Secondary | ICD-10-CM | POA: Diagnosis not present

## 2022-11-29 DIAGNOSIS — D3A8 Other benign neuroendocrine tumors: Secondary | ICD-10-CM

## 2022-11-29 DIAGNOSIS — D509 Iron deficiency anemia, unspecified: Secondary | ICD-10-CM

## 2022-11-29 NOTE — Patient Instructions (Addendum)
Continue with PO iron and following with Dr. Ellin Saba Will follow for PET scan results Please make me aware of rectal bleeding or melena Can start colace once daily for constipation, if this does not provide results, can Start taking Miralax 1 capful every day for one week. If bowel movements do not improve, increase to 1 capful every 12 hours. If after two weeks there is no improvement, increase to 1 capful every 8 hours   You can try taking tylenol up to 2000mg  daily for your joint pain. Please avoid NSAIDs (advil, aleve, naproxen, goody powder, ibuprofen).    Follow up 3 months

## 2022-11-29 NOTE — Progress Notes (Addendum)
Primary Care Physician:  Kerri Perches, MD  Primary GI: Levon Hedger   Patient Location: Home   Provider Location: [facility]   Reason for Visit: hospital follow up   Persons present on the virtual encounter, with roles: [name] [name] L. [name], MSN, APRN, AGNP-C, [name]    Total time (minutes) spent on medical discussion: 15 minutes  Virtual Visit via Telephone  visit is conducted virtually and was requested by patient.   I connected with [name] [name] on 11/29/22 at 12:15 PM EDT by telephone and verified that I am speaking with the correct person using two identifiers.   I discussed the limitations, risks, security and privacy concerns of performing an evaluation and management service by telephone and the availability of in person appointments. I also discussed with the patient that there may be a patient responsible charge related to this service. The patient expressed understanding and agreed to proceed.  Chief Complaint  Patient presents with   Hospitalization Follow-up    Patient here today for a hospital follow up on recent Ed to admission she had on 10/24/2022 due to anemia and Gi hemorrhage. Patient denies any dark or blood stools, no shortness of breath, dizziness, chest pain. Patient does have some fatigue at times. Patient takes ferrous sulfate 325 once per day.   History of Present Illness: [name] [name] is a 82 y.o. female with past medical history of CAD, CKD stage III, HTN, glaucoma, HLD, hypothyroidism, IDA, NET  Patient presenting today for hospital follow up.  Admission in late April for acute on chronic anemia with hgb 8.1, heme positive stool. No melena, noted some BRBPR in setting of straining. No endoscopic evaluation was performed, considerations to have possible small bowel imaging as outpatient given incomplete capsule endoscopy x2 previously. 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   She had visit with oncology on 5/17, again declined referral to [facility] for EUS Hematology suspected IDA due to CKD, iron deficiency, chronic blood loss, receiving Retacrit 6k units Q4w, consider IV iron if ferritin low on recheck.  As She does not have any signs or symptoms of carcinoid syndrome or obstruction, oncology Recommend chromogranin a level and 24-hour urine for 5-HIAA as well as dotatate PET scan. consider Sandostatin/octreotide injections if any worsening.   Upcoming Dotatate pet on 12/06/22  Labs on 5/17 with hgb 8.8, ferritin 18, iron 290, TIBC 405, saturation 72  5-HIAA urine on 5/22 was 2, quant on 24h urine 4.8   Present: Patient reports she is feeling okay. States she felt fine during time of hospital admission but was told that her blood counts were low and advised to proceed to the ED. Her knees and hips are bothering her today. Denies any abdominal pain. No nausea or vomiting. She denies rectal bleeding. Stools are darker on her daily iron pill but not black. She reports appetite is good, she eats very well. No weight loss she is aware of. She denies dizziness, she has some fatigue and SOB at times but thinks this is related to her Afib. She is getting retacrit every 2 weeks, noting sometimes she does not have to get this and sometimes she does. She has some constipation from her iron pills, not taking anything for this. Having a BM usually daily but sometimes stools are a bit harder. She has an iron infusion on 6/7  ordered by Dr. Kirtland Bouchard.   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 She does not wish to proceed with any endoscopic evaluations.  PET 05/2021:  1. No focal activity within the duodenum to the localize well differentiated neuroendocrine tumor. 2. No evidence of metastatic neuroendocrine tumor. 3. Some motion degradation in the  upper abdomen as above. Givens capsule study: 2021   Patient was able to swallow given capsule without any difficulty. Given capsule did not reach cecum during the study.  Focal erythema and edema involving antral mucosa without stigmata of bleeding Givens capsule study 2019 Givens capsule did not reach cecum during the study Brunner's gland hypertrophy with focal speck of blood coffee-ground. No other mucosal abnormalities noted to small bowel mucosa. Last Colonoscopy: 03/2021  4 small polyps sigmoid diverticulosis and external hemorrhoids. 3 polyps are tubular adenomas and the fourth polyp was sessile serrated polyp.  Last Endoscopy/EUS 08/2021: EGD Impression: - No gross lesions in esophagus. Z-line irregular, 40 cm from the incisors. - J-shaped deformity in the entire stomach. But no gross mucosal lesions in the stomach. - Melanosis/hemosiderosis of the visualized duodenum. - Submucosal nodule found in the duodenum - consistent with previously biopsied Duodenal NET.   - After having some tension with passing the capped  therapeutic endoscope, I noted an incidental  superficial mucosal wrent at the UES/proximal esophagus. EUS Impression: - An intramural (subepithelial) lesion was found in the second portion of the duodenum. The lesion  appeared to originate from within the deep mucosa (Layer 2) and submucosa (Layer 3). As noted above  there was what appeared to be lesion moving very close with near abutment fo the MP (Layer 4) though overt invasion was note seen. A tissue diagnosis was obtained prior to this exam. This is consistent  with a neuroendocrine tumor. I did not feelt an EMR resection today was safe based on my imaging/views,  though it could still be possible or could require ESD attempt or FTRD. - No malignant-appearing lymph nodes were visualized in the celiac region (level 20), peripancreatic region and porta hepatis region.    Past Medical History:  Diagnosis Date   A-fib [facility])    Blood transfusion without reported  diagnosis    CAD (coronary artery disease)    Multivessel status post CABG October 2005   Carotid artery disease (HCC)    CKD (chronic kidney disease), stage III (HCC)    Essential hypertension    Glaucoma    Laser surgery 2010   Hyperlipidemia    Hypothyroidism    Iron deficiency anemia    Obesity      Past Surgical History:  Procedure Laterality Date   BIOPSY  04/27/2021   Procedure: BIOPSY;  Surgeon: Rachael Fee, MD;  Location: [facility] ENDOSCOPY;  Service: Endoscopy;;   COLONOSCOPY N/A 02/04/2013   Procedure: COLONOSCOPY;  Surgeon: Malissa Hippo, MD;  Location: [facility];  Service: Endoscopy;  Laterality: N/A;  1015   COLONOSCOPY N/A 12/21/2016   Procedure: COLONOSCOPY;  Surgeon: Malissa Hippo, MD;  [facility];  Service: Endoscopy;  Laterality: N/A;   COLONOSCOPY WITH PROPOFOL N/A 03/22/2021   Procedure: COLONOSCOPY WITH PROPOFOL;  Surgeon: Malissa Hippo, MD;  [facility]: [facility];  Service: Endoscopy;  Laterality: N/A;  8:15   CORONARY ARTERY BYPASS GRAFT  October 2005   Dr. Cornelius Moras - LIMA to LAD, SVG to OM 2 and OM 3, SVG to PDA   ESOPHAGOGASTRODUODENOSCOPY N/A 12/21/2016  Procedure: ESOPHAGOGASTRODUODENOSCOPY (EGD);  Surgeon: Malissa Hippo, MD;  [facility];  Service: Endoscopy;  Laterality: N/A;  11:10   ESOPHAGOGASTRODUODENOSCOPY N/A 02/27/2018   Procedure: ESOPHAGOGASTRODUODENOSCOPY (EGD);  Surgeon: Malissa Hippo, MD;  [facility];  Service: Endoscopy;  Laterality: N/A;   ESOPHAGOGASTRODUODENOSCOPY (EGD) WITH PROPOFOL N/A 04/27/2021   Procedure: ESOPHAGOGASTRODUODENOSCOPY (EGD) WITH PROPOFOL;  Surgeon: Rachael Fee, MD;  Location: [facility] END[facility];  Service: Endoscopy;  Laterality: N/A;   ESOPHAGOGASTRODUODENOSCOPY (EGD) WITH PROPOFOL N/A 08/17/2021   Procedure: ESOPHAGOGASTRODUODENOSCOPY (EGD) WITH PROPOFOL;  Surgeon: Meridee Score Netty Starring., MD;  Location: [facility] ENDOSCOPY;  Service: Gastroenterology;  Laterality: N/A;   EUS  N/A 04/27/2021   Procedure: UPPER ENDOSCOPIC ULTRASOUND (EUS) RADIAL;  Surgeon: Rachael Fee, MD;  Location: [facility] END[facility];  Service: Endoscopy;  Laterality: N/A;   EYE SURGERY  2010   laser treatment to both eyes for glaucoma   GIVENS CAPSULE STUDY N/A 03/07/2018   Procedure: GIVENS CAPSULE STUDY;  Surgeon: Malissa Hippo, MD;  [facility];  Service: Endoscopy;  Laterality: N/A;   GIVENS CAPSULE STUDY N/A 02/05/2020   Procedure: GIVENS CAPSULE STUDY;  Surgeon: Malissa Hippo, MD;  [facility];  Service: Endoscopy;  Laterality: N/A;  730   POLYPECTOMY  03/22/2021   Procedure: POLYPECTOMY;  Surgeon: Malissa Hippo, MD;  [facility];  Service: Endoscopy;;   UPPER ESOPHAGEAL ENDOSCOPIC ULTRASOUND (EUS) N/A 08/17/2021   Procedure: UPPER ESOPHAGEAL ENDOSCOPIC ULTRASOUND (EUS);  Surgeon: Lemar Lofty., MD;  Location: [facility] ENDOSCOPY;  Service: Gastroenterology;  Laterality: N/A;     Current Meds  Medication Sig   amLODipine (NORVASC) 5 MG tablet TAKE ONE TABLET BY MOUTH TWICE DAILY FOR BLOOD PRESSURE (Patient taking differently: Take 5 mg by mouth daily.)   cetirizine (ZYRTEC) 10 MG tablet Take 1 tablet (10 mg total) by mouth daily.   famotidine (PEPCID) 20 MG tablet Take 1 tablet (20 mg total) by mouth 2 (two) times daily.   ferrous sulfate 325 (65 FE) MG tablet Take 325 mg by mouth daily with breakfast.   fish oil-omega-3 fatty acids 1000 MG capsule Take 1 g by mouth daily.   fluticasone (FLONASE) 50 MCG/ACT nasal spray USE 2 SPRAYS IN EACH NOSTRIL ONCE DAILY. SHAKE GENTLY BEFORE USING.   hydrALAZINE (APRESOLINE) 100 MG tablet Take one tablet by mouth once daily   latanoprost (XALATAN) 0.005 % ophthalmic solution Place 1 drop into both eyes at bedtime.   levothyroxine (SYNTHROID) 100 MCG tablet Take 1 tablet (100 mcg total) by mouth daily.   metoprolol succinate (TOPROL-XL) 50 MG 24 hr tablet Take 1 tablet (50 mg total) by mouth daily. Take with or  immediately following a meal.   nitroGLYCERIN (NITROSTAT) 0.4 MG SL tablet Place 1 tablet (0.4 mg total) under the tongue every 5 (five) minutes x 3 doses as needed for chest pain (if no relief after 3rd dose, proceed to ED or call 911).   Polyvinyl Alcohol-Povidone (REFRESH OP) Place 1 drop into both eyes at bedtime.   Rivaroxaban (XARELTO) 15 MG TABS tablet Take 1 tablet (15 mg total) by mouth daily with supper.   rosuvastatin (CRESTOR) 10 MG tablet Take 1 tablet (10 mg total) by mouth daily.   SYSTANE ULTRA 0.4-0.3 % SOLN Place 1 drop into both eyes daily as needed (dry eyes).   telmisartan (MICARDIS) 20 MG tablet Take 10 mg by mouth daily.   trolamine salicylate (ASPERCREME) 10 % cream Apply 1  application topically as needed for muscle pain.   UNABLE TO FIND Shower chair x 1  DX M54.36     Family History  Problem Relation Age of Onset   Aneurysm Mother    Hypertension Mother    Coronary artery disease Mother    Coronary artery disease Father    Diabetes Sister    Coronary artery disease Sister    Diverticulosis Sister    COPD Sister    Kidney disease Sister    Diabetes Sister    Hypertension Sister    Breast cancer Sister 61   Hearing loss Sister    Heart Problems Sister        pacemaker   Colon cancer Brother 64       lived 18 months afterwards   Liver cancer Brother 65   Diabetes Son    Pancreatic cancer Son    Esophageal cancer Neg Hx    Inflammatory bowel disease Neg Hx    Stomach cancer Neg Hx     Social History   Socioeconomic History   Marital status: Married    Spouse name: Not on file   Number of children: Not on file   Years of education: Not on file   Highest education level: Not on file  Occupational History   Occupation: retired   Tobacco Use   Smoking status: Former    Packs/day: 0.25    Years: 40.00    Additional pack years: 0.00    Total pack years: 10.00    Types: Cigarettes    Quit date: 07/03/2003    Years since quitting: 19.4    Passive  exposure: Never   Smokeless tobacco: Never  Vaping Use   Vaping Use: Never used  Substance and Sexual Activity   Alcohol use: No    Alcohol/week: 0.0 standard drinks of alcohol   Drug use: No   Sexual activity: Never  Other Topics Concern   Not on file  Social History Narrative   Not on file   Social Determinants of Health   Financial Resource Strain: Low Risk  (12/11/2021)   Overall Financial Resource Strain (CARDIA)    Difficulty of Paying Living Expenses: Not hard at all  Food Insecurity: No Food Insecurity (10/24/2022)   Hunger Vital Sign    Worried About Running Out of Food in the Last Year: Never true    Ran Out of Food in the Last Year: Never true  Transportation Needs: No Transportation Needs (10/24/2022)   PRAPARE - Administrator, Civil Service (Medical): No    Lack of Transportation (Non-Medical): No  Physical Activity: Insufficiently Active (12/11/2021)   Exercise Vital Sign    Days of Exercise per Week: 5 days    Minutes of Exercise per Session: 10 min  Stress: No Stress Concern Present (12/11/2021)   [employer] of Occupational Health - Occupational Stress Questionnaire    Feeling of Stress : Not at all  Social Connections: Moderately Integrated (12/11/2021)   Social Connection and Isolation Panel [NHANES]    Frequency of Communication with Friends and Family: More than three times a week    Frequency of Social Gatherings with Friends and Family: More than three times a week    Attends Religious Services: More than 4 times per year    Active Member of [employer] or Organizations: No    Attends Banker Meetings: Never    Marital Status: Married    Review of Systems: Gen: Denies fever, chills,  anorexia. Denies fatigue, weakness, weight loss.  CV: Denies chest pain, palpitations, syncope, peripheral edema, and claudication. Resp: Denies dyspnea at rest, cough, wheezing, coughing up blood, and pleurisy. GI: see HPI Derm: Denies rash,  itching, dry skin Psych: Denies depression, anxiety, memory loss, confusion. No homicidal or suicidal ideation.  Heme: Denies bruising, bleeding, and enlarged lymph nodes.  Observations/Objective: No distress. Unable to perform physical exam due to telephone encounter. No video available.   Assessment and Plan: [name] [name] is a 82 y.o. female with past medical history of CAD, CKD stage III, HTN, glaucoma, HLD, hypothyroidism, IDA, NET, presenting for hospital follow up of acute on chronic anemia.  Anemia: chronic, last hgb 8.8 (range 8-10), taking daily iron, with actual elevation of iron on 5/17 with iron 290, saturation 72%. She is following with hematology and has upcoming iron infusion on 6/7. She feels good. She was FOBT positive in hospital, though Denies rectal bleeding or melena. No endoscopic evaluations done during recent admission. She has had extensive evaluation over the past few years for her anemia to include givens capsule study x2 (incomplete studies), EUS/EGD/Colonoscopy without obvious findings to explain her anemia.  Could potentially consider CTE for further evaluation, I will discuss this with Dr. Levon Hedger for further recommendations as patient does not want to do any further endoscopic procedures.   She also has history of duodenal NET and has been recommended to have further evaluation at [facility], though she has declined referral multiple times.  She has upcoming PET scan on 6/6 via oncology for further monitoring of this.    Constipation: having a BM daily but with some harder stools, has some colace at home which she has not taken. Advised to start colace once daily, if this does not provide results would recommend Start taking Miralax 1 capful every day for one week. If bowel movements do not improve, increase to 1 capful every 12 hours. If after two weeks there is no improvement, increase to 1 capful every 8 hours  She is having a lot of joint pain recently, advised her  she can try tylenol, no more than 2g per day, should avoid NSAIDs.   -continue to follow with hematology -consider CTE- will discuss with Dr. Levon Hedger  -colace for constipation, start miralax if no improvement  -tylenol, no more than 2g/day for joint pain   Follow Up Instructions: 3 months    I discussed the assessment and treatment plan with the patient. The patient was provided an opportunity to ask questions and all were answered. The patient agreed with the plan and demonstrated an understanding of the instructions.   The patient was advised to call back or seek an in-person evaluation if the symptoms worsen or if the condition fails to improve as anticipated.  I provided 15 minutes of NON face-to-face time during this telephone encounter   Rakeisha Nyce L. Jeanmarie Hubert, MSN, APRN, AGNP-C Adult-Gerontology Nurse Practitioner [facility]  I have reviewed the note and agree with the APP's assessment as described in this progress note  I agree with possibility of performing a CT enterography to further evaluate her iron deficiency anemia, this will help evaluating small bowel masses that could be missed with regular CT scan, especially as she has required frequent IV iron infusions and is refusing to undergo any further repeat endoscopic workup.  Katrinka Blazing, MD Gastroenterology and Hepatology [facility] Gastroenterology

## 2022-11-30 ENCOUNTER — Inpatient Hospital Stay: Payer: Medicare Other

## 2022-12-04 ENCOUNTER — Telehealth: Payer: Self-pay | Admitting: Family Medicine

## 2022-12-04 DIAGNOSIS — K5900 Constipation, unspecified: Secondary | ICD-10-CM | POA: Insufficient documentation

## 2022-12-04 NOTE — Telephone Encounter (Deleted)
Prescription for home delivery    telmisartan (MICARDIS) 20 MG tablet [[id number]] amLODipine (NORVASC) 5 MG tablet [[id number]] metoprolol succinate (TOPROL-XL) 50 MG 24 hr tablet [[id number]]  Xarelto as well but did not see that on list.   Fax [phone]

## 2022-12-04 NOTE — Telephone Encounter (Signed)
Prescription for home delivery      telmisartan (MICARDIS) 20 MG tablet [[id number]] amLODipine (NORVASC) 5 MG tablet [[id number]] metoprolol succinate (TOPROL-XL) 50 MG 24 hr tablet [[id number]]  Rivaroxaban (XARELTO) 15 MG TABS tablet [[id number]]        Fax [phone]

## 2022-12-05 ENCOUNTER — Encounter (HOSPITAL_COMMUNITY)
Admission: RE | Admit: 2022-12-05 | Discharge: 2022-12-05 | Disposition: A | Discharge status: Home / Self Care | Payer: Medicare Other | Source: Ambulatory Visit | Attending: Nephrology | Admitting: Nephrology

## 2022-12-05 ENCOUNTER — Other Ambulatory Visit: Payer: Self-pay

## 2022-12-05 ENCOUNTER — Telehealth: Payer: Self-pay | Admitting: Family Medicine

## 2022-12-05 DIAGNOSIS — N1832 Chronic kidney disease, stage 3b: Secondary | ICD-10-CM | POA: Insufficient documentation

## 2022-12-05 DIAGNOSIS — D631 Anemia in chronic kidney disease: Secondary | ICD-10-CM | POA: Insufficient documentation

## 2022-12-05 MED ORDER — EPOETIN ALFA-EPBX 10000 UNIT/ML IJ SOLN: 6000.0000 [IU] | Freq: Once | INTRAMUSCULAR | Status: DC

## 2022-12-05 NOTE — Telephone Encounter (Signed)
This was prescribed by the kidney dr. She would need to contact him

## 2022-12-05 NOTE — Telephone Encounter (Signed)
Spoke with patient to schedule her AWVS.  She asked if someone could contact her in reference to getting Retacrit shot completed in home instead of her having to go to [facility]. Thank you,  Judeth Cornfield,  AMB Clinical Support [facility] AWV Program Direct Dial ??[phone]

## 2022-12-06 ENCOUNTER — Other Ambulatory Visit: Payer: Self-pay

## 2022-12-06 ENCOUNTER — Ambulatory Visit (HOSPITAL_COMMUNITY)
Admission: RE | Admit: 2022-12-06 | Discharge: 2022-12-06 | Disposition: A | Discharge status: Home / Self Care | Payer: Medicare Other | Source: Ambulatory Visit | Attending: Hematology | Admitting: Hematology

## 2022-12-06 DIAGNOSIS — N1832 Chronic kidney disease, stage 3b: Secondary | ICD-10-CM | POA: Diagnosis not present

## 2022-12-06 DIAGNOSIS — D3A010 Benign carcinoid tumor of the duodenum: Secondary | ICD-10-CM | POA: Insufficient documentation

## 2022-12-06 DIAGNOSIS — D631 Anemia in chronic kidney disease: Secondary | ICD-10-CM | POA: Diagnosis not present

## 2022-12-06 DIAGNOSIS — C7A010 Malignant carcinoid tumor of the duodenum: Secondary | ICD-10-CM | POA: Diagnosis not present

## 2022-12-06 DIAGNOSIS — D509 Iron deficiency anemia, unspecified: Secondary | ICD-10-CM | POA: Diagnosis not present

## 2022-12-06 MED ORDER — COPPER CU 64 DOTATATE 1 MCI/ML IV SOLN
4.0000 | Freq: Once | INTRAVENOUS | Status: AC
  Administered 2022-12-06: 4.03 via INTRAVENOUS

## 2022-12-06 MED ORDER — TELMISARTAN 20 MG PO TABS: 10.0000 mg | ORAL_TABLET | Freq: Every day | ORAL | 11 refills | Status: DC

## 2022-12-06 MED ORDER — RIVAROXABAN 15 MG PO TABS: 15.0000 mg | ORAL_TABLET | Freq: Every day | ORAL | 0 refills | Status: DC

## 2022-12-06 MED ORDER — METOPROLOL SUCCINATE ER 50 MG PO TB24: 50.0000 mg | ORAL_TABLET | Freq: Every day | ORAL | 1 refills | Status: DC

## 2022-12-06 MED FILL — Ferumoxytol Inj 510 MG/17ML (30 MG/ML) (Elemental Fe): INTRAVENOUS | Qty: 17 | Status: AC

## 2022-12-06 NOTE — Telephone Encounter (Signed)
Medication sent.

## 2022-12-07 ENCOUNTER — Inpatient Hospital Stay: Payer: Medicare Other

## 2022-12-11 ENCOUNTER — Other Ambulatory Visit: Payer: Self-pay

## 2022-12-11 ENCOUNTER — Encounter (HOSPITAL_COMMUNITY)
Admission: RE | Admit: 2022-12-11 | Payer: Medicare Other | Source: Ambulatory Visit | Attending: Family Medicine | Admitting: Family Medicine

## 2022-12-11 MED ORDER — AMLODIPINE BESYLATE 5 MG PO TABS: 5.0000 mg | ORAL_TABLET | Freq: Every day | ORAL | 5 refills | Status: DC

## 2022-12-11 MED ORDER — FAMOTIDINE 20 MG PO TABS: 20.0000 mg | ORAL_TABLET | Freq: Two times a day (BID) | ORAL | 1 refills | Status: DC

## 2022-12-12 ENCOUNTER — Telehealth (HOSPITAL_COMMUNITY): Payer: Self-pay

## 2022-12-12 ENCOUNTER — Encounter (HOSPITAL_COMMUNITY)
Admission: RE | Admit: 2022-12-12 | Discharge: 2022-12-12 | Disposition: A | Discharge status: Home / Self Care | Payer: Medicare Other | Source: Ambulatory Visit | Attending: Nephrology | Admitting: Nephrology

## 2022-12-12 ENCOUNTER — Emergency Department (HOSPITAL_COMMUNITY)
Admission: EM | Admit: 2022-12-12 | Discharge: 2022-12-12 | Disposition: A | Discharge status: Home / Self Care | Payer: Medicare Other | Attending: Emergency Medicine | Admitting: Emergency Medicine

## 2022-12-12 ENCOUNTER — Other Ambulatory Visit: Payer: Self-pay

## 2022-12-12 ENCOUNTER — Encounter (HOSPITAL_COMMUNITY): Payer: Self-pay

## 2022-12-12 ENCOUNTER — Telehealth: Payer: Self-pay | Admitting: Family Medicine

## 2022-12-12 VITALS — BP 125/69 | HR 68 | Temp 97.5°F | Resp 16

## 2022-12-12 DIAGNOSIS — D649 Anemia, unspecified: Secondary | ICD-10-CM | POA: Insufficient documentation

## 2022-12-12 DIAGNOSIS — Z951 Presence of aortocoronary bypass graft: Secondary | ICD-10-CM | POA: Diagnosis not present

## 2022-12-12 DIAGNOSIS — I2510 Atherosclerotic heart disease of native coronary artery without angina pectoris: Secondary | ICD-10-CM | POA: Diagnosis not present

## 2022-12-12 DIAGNOSIS — Z7901 Long term (current) use of anticoagulants: Secondary | ICD-10-CM | POA: Insufficient documentation

## 2022-12-12 DIAGNOSIS — Z79899 Other long term (current) drug therapy: Secondary | ICD-10-CM | POA: Diagnosis not present

## 2022-12-12 DIAGNOSIS — N1832 Chronic kidney disease, stage 3b: Secondary | ICD-10-CM

## 2022-12-12 DIAGNOSIS — I10 Essential (primary) hypertension: Secondary | ICD-10-CM | POA: Diagnosis not present

## 2022-12-12 DIAGNOSIS — N189 Chronic kidney disease, unspecified: Secondary | ICD-10-CM | POA: Diagnosis not present

## 2022-12-12 LAB — COMPREHENSIVE METABOLIC PANEL
ALT: 16 U/L (ref 0–44)
AST: 21 U/L (ref 15–41)
Albumin: 3.8 g/dL (ref 3.5–5.0)
Alkaline Phosphatase: 73 U/L (ref 38–126)
Anion gap: 10 (ref 5–15)
BUN: 25 mg/dL — ABNORMAL HIGH (ref 8–23)
CO2: 19 mmol/L — ABNORMAL LOW (ref 22–32)
Calcium: 10 mg/dL (ref 8.9–10.3)
Chloride: 109 mmol/L (ref 98–111)
Creatinine, Ser: 1.35 mg/dL — ABNORMAL HIGH (ref 0.44–1.00)
GFR, Estimated: 40 mL/min — ABNORMAL LOW (ref >60)
Glucose, Bld: 99 mg/dL (ref 70–99)
Potassium: 4.4 mmol/L (ref 3.5–5.1)
Sodium: 138 mmol/L (ref 135–145)
Total Bilirubin: 0.4 mg/dL (ref 0.3–1.2)
Total Protein: 7.5 g/dL (ref 6.5–8.1)

## 2022-12-12 LAB — CBC WITH DIFFERENTIAL/PLATELET
Abs Immature Granulocytes: 0.01 10*3/uL (ref 0.00–0.07)
Basophils Absolute: 0.1 10*3/uL (ref 0.0–0.1)
Basophils Relative: 1 %
Eosinophils Absolute: 0.4 10*3/uL (ref 0.0–0.5)
Eosinophils Relative: 8 %
HCT: 25.2 % — ABNORMAL LOW (ref 36.0–46.0)
Hemoglobin: 7.6 g/dL — ABNORMAL LOW (ref 12.0–15.0)
Immature Granulocytes: 0 %
Lymphocytes Relative: 22 %
Lymphs Abs: 1.1 10*3/uL (ref 0.7–4.0)
MCH: 30.3 pg (ref 26.0–34.0)
MCHC: 30.2 g/dL (ref 30.0–36.0)
MCV: 100.4 fL — ABNORMAL HIGH (ref 80.0–100.0)
Monocytes Absolute: 0.4 10*3/uL (ref 0.1–1.0)
Monocytes Relative: 8 %
Neutro Abs: 3 10*3/uL (ref 1.7–7.7)
Neutrophils Relative %: 61 %
Platelets: 218 10*3/uL (ref 150–400)
RBC: 2.51 MIL/uL — ABNORMAL LOW (ref 3.87–5.11)
RDW: 14.6 % (ref 11.5–15.5)
WBC: 4.9 10*3/uL (ref 4.0–10.5)
nRBC: 0 % (ref 0.0–0.2)

## 2022-12-12 LAB — PREPARE RBC (CROSSMATCH)

## 2022-12-12 LAB — POCT HEMOGLOBIN-HEMACUE: Hemoglobin: 7.4 g/dL — ABNORMAL LOW (ref 12.0–15.0)

## 2022-12-12 MED ORDER — FAMOTIDINE 20 MG PO TABS: 20.0000 mg | ORAL_TABLET | Freq: Two times a day (BID) | ORAL | 1 refills | Status: DC

## 2022-12-12 MED ORDER — EPOETIN ALFA-EPBX 10000 UNIT/ML IJ SOLN: 6000.0000 [IU] | Freq: Once | INTRAMUSCULAR | Status: DC

## 2022-12-12 MED ORDER — AMLODIPINE BESYLATE 5 MG PO TABS: 5.0000 mg | ORAL_TABLET | Freq: Every day | ORAL | 5 refills | Status: DC

## 2022-12-12 MED ORDER — SODIUM CHLORIDE 0.9% IV SOLUTION: Freq: Once | INTRAVENOUS | Status: DC

## 2022-12-12 NOTE — ED Provider Notes (Signed)
[facility] EMERGENCY DEPARTMENT AT [facility] Provider Note   CSN: [id number] Arrival date & time: 12/12/22  1202     History  Chief Complaint  Patient presents with   Abnormal Lab    [name] [name] is a 82 y.o. female.  82 year old female with history of chronic anemia, CKD, duodenal NET, atrial fibrillation on Xarelto, and CAD status post CABG who presents emergency department as a referral for low blood counts.  Patient was going to an infusion center to get a Retacrit injection and her hemoglobin was 7.2 so they referred her to the emergency department for evaluation.  She has some dark stools since starting the iron and the Xarelto for months but denies any black stools or bright red blood per rectum.  Denies any new chest pain, shortness of breath, or dizziness or fatigue.  No vaginal bleeding.  Has seen hematology who suspects iron deficiency anemia due to chronic blood loss as well as CKD.        Home Medications Prior to Admission medications   Medication Sig Start Date End Date Taking? Authorizing Provider  amLODipine (NORVASC) 5 MG tablet Take 1 tablet (5 mg total) by mouth daily. TAKE ONE TABLET BY MOUTH TWICE DAILY FOR BLOOD PRESSURE 12/12/22   Kerri Perches, MD  cetirizine (ZYRTEC) 10 MG tablet Take 1 tablet (10 mg total) by mouth daily. 11/20/22   Kerri Perches, MD  famotidine (PEPCID) 20 MG tablet Take 1 tablet (20 mg total) by mouth 2 (two) times daily. 12/12/22 12/12/23  Kerri Perches, MD  ferrous sulfate 325 (65 FE) MG tablet Take 325 mg by mouth daily with breakfast.    [provider]  fish oil-omega-3 fatty acids 1000 MG capsule Take 1 g by mouth daily.    [provider]  fluticasone (FLONASE) 50 MCG/ACT nasal spray USE 2 SPRAYS IN EACH NOSTRIL ONCE DAILY. SHAKE GENTLY BEFORE USING. 01/10/21   Kerri Perches, MD  hydrALAZINE (APRESOLINE) 100 MG tablet Take one tablet by mouth once daily 11/20/22   Kerri Perches, MD  latanoprost (XALATAN) 0.005 % ophthalmic solution Place 1 drop into both eyes at bedtime.    [provider]  levothyroxine (SYNTHROID) 100 MCG tablet Take 1 tablet (100 mcg total) by mouth daily. 11/20/22   Kerri Perches, MD  metoprolol succinate (TOPROL-XL) 50 MG 24 hr tablet Take 1 tablet (50 mg total) by mouth daily. Take with or immediately following a meal. 12/06/22   Kerri Perches, MD  nitroGLYCERIN (NITROSTAT) 0.4 MG SL tablet Place 1 tablet (0.4 mg total) under the tongue every 5 (five) minutes x 3 doses as needed for chest pain (if no relief after 3rd dose, proceed to ED or call 911). 11/21/22   Jonelle Sidle, MD  Polyvinyl Alcohol-Povidone (REFRESH OP) Place 1 drop into both eyes at bedtime.    [provider]  Rivaroxaban (XARELTO) 15 MG TABS tablet Take 1 tablet (15 mg total) by mouth daily with supper. 12/06/22   Kerri Perches, MD  rosuvastatin (CRESTOR) 10 MG tablet Take 1 tablet (10 mg total) by mouth daily. 11/20/22   Kerri Perches, MD  SYSTANE ULTRA 0.4-0.3 % SOLN Place 1 drop into both eyes daily as needed (dry eyes). 05/22/21   [provider]  telmisartan (MICARDIS) 20 MG tablet Take 0.5 tablets (10 mg total) by mouth daily. 12/06/22 12/06/23  Kerri Perches, MD  trolamine salicylate (ASPERCREME) 10 % cream  Apply 1 application topically as needed for muscle pain.    [provider]  UNABLE TO FIND Shower chair x 1  DX M54.36 05/11/20   Kerri Perches, MD  UNABLE TO FIND Tdap x 1 fax vaccine record to [phone] 05/15/22   Kerri Perches, MD      Allergies    Tramadol, Lisinopril, Shrimp (diagnostic), and Clonidine derivatives    Review of Systems   Review of Systems  Physical Exam Updated Vital Signs BP (!) 140/59   Pulse 63   Temp 97.8 F (36.6 C) (Oral)   Resp (!) 22   Ht 5\' 1"  (1.549 m)   Wt 67.5 kg   SpO2 95%   BMI 28.12 kg/m  Physical Exam Vitals and nursing note  reviewed.  Constitutional:      General: She is not in acute distress.    Appearance: She is well-developed.     Comments: Pale appearing but in no acute distress  HENT:     Head: Normocephalic and atraumatic.     Right Ear: External ear normal.     Left Ear: External ear normal.     Nose: Nose normal.  Eyes:     Extraocular Movements: Extraocular movements intact.     Conjunctiva/sclera: Conjunctivae normal.     Pupils: Pupils are equal, round, and reactive to light.  Cardiovascular:     Rate and Rhythm: Normal rate. Rhythm irregular.     Heart sounds: No murmur heard. Pulmonary:     Effort: Pulmonary effort is normal. No respiratory distress.     Breath sounds: Normal breath sounds.  Abdominal:     General: Abdomen is flat. There is no distension.     Palpations: Abdomen is soft. There is no mass.     Tenderness: There is no abdominal tenderness. There is no guarding.  Musculoskeletal:     Cervical back: Normal range of motion and neck supple.     Right lower leg: No edema.     Left lower leg: No edema.  Skin:    General: Skin is warm and dry.  Neurological:     Mental Status: She is alert and oriented to person, place, and time. Mental status is at baseline.  Psychiatric:        Mood and Affect: Mood normal.     ED Results / Procedures / Treatments   Labs (all labs ordered are listed, but only abnormal results are displayed) Labs Reviewed  COMPREHENSIVE METABOLIC PANEL - Abnormal; Notable for the following components:      Result Value   CO2 19 (*)    BUN 25 (*)    Creatinine, Ser 1.35 (*)    GFR, Estimated 40 (*)    All other components within normal limits  CBC WITH DIFFERENTIAL/PLATELET - Abnormal; Notable for the following components:   RBC 2.51 (*)    Hemoglobin 7.6 (*)    HCT 25.2 (*)    MCV 100.4 (*)    All other components within normal limits  TYPE AND SCREEN  PREPARE RBC (CROSSMATCH)    EKG EKG Interpretation  Date/Time:  Wednesday December 12 2022 12:14:47 EDT Ventricular Rate:  69 PR Interval:    QRS Duration: 78 QT Interval:  390 QTC Calculation: 418 R Axis:   84 Text Interpretation: Atrial fibrillation Ventricular premature complex Borderline right axis deviation Borderline repolarization abnormality Confirmed by Vonita Moss [phone]) on 12/12/2022 4:36:01 PM  Radiology No results found.  Procedures Procedures  Medications Ordered in ED Medications - No data to display  ED Course/ Medical Decision Making/ A&P                             Medical Decision Making Amount and/or Complexity of Data Reviewed Labs: ordered.   [name] [name] is a 82 y.o. female with comorbidities that complicate the patient evaluation including chronic anemia, atrial fibrillation on Xarelto, CKD, duodenal NET, and CAD status post CABG who presents emergency department as a referral for low blood counts without any symptoms  Initial Ddx:  Chronic anemia, GI bleed, asymptomatic anemia, anemia from chronic kidney disease  MDM/Course:  The patient likely has chronic anemia from her kidney disease as well as possibly a slow GI bleed from her duodenal neuroendocrine tumor and Xarelto use.  Has been Hemoccult positive in the past.  When we look at her blood counts it appears that her new baseline is around 8 and she is not far from this at this time.  With her history of CABG we will get transfuse her a unit to get her above goal of 8.  Do not feel the patient warrants admission at this time since she is already getting iron infusions and Depo injections as an outpatient.  Do not feel that the patient is actively bleeding or requires admission for observation especially since she is asymptomatic at this time.  Patient consented for blood and ordered for 1 unit and then signed out to the oncoming physician (Dr. Deretha Emory) in stable condition.  This patient presents to the ED for concern of complaints listed in HPI, this involves an extensive  number of treatment options, and is a complaint that carries with it a high risk of complications and morbidity. Disposition including potential need for admission considered.   Dispo: Pending remainder of workup  Additional history obtained from family Records reviewed Outpatient Clinic Notes The following labs were independently interpreted: CBC and show chronic anemia I personally reviewed and interpreted cardiac monitoring: atrial fibrillation (normal rate) I personally reviewed and interpreted the pt's EKG: see above for interpretation  I have reviewed the patients home medications and made adjustments as needed Social Determinants of health:  Elderly         Final Clinical Impression(s) / ED Diagnoses Final diagnoses:  Anemia, unspecified type    Rx / DC Orders ED Discharge Orders     None         Rondel Baton, MD 12/13/22 [phone]

## 2022-12-12 NOTE — ED Triage Notes (Signed)
Pt was at the infusion center for a retacrit injection and her hgb was noted to be 7.2 so her provider wanted her to be evaluated in the ER.

## 2022-12-12 NOTE — Telephone Encounter (Signed)
RX SENT TO [facility]

## 2022-12-12 NOTE — Progress Notes (Signed)
Diagnosis: Anemia in Chronic Kidney Disease  Provider:  Celso Amy MD  Procedure: Injection  Retacrit (epoetin alfa-epbx), Dose: 6000 Units, Site: subcutaneous, Number of injections: 1   HGB   7.4    physician notified, patient did not receive the Retacrit,  patient is going to the Emergency Dept for a unit of Blood  Post Care:  No observation needed  Discharge: Condition: Stable, Destination: patient going to ED dept   . AVS Declined  Performed by:  Forrest Moron, RN

## 2022-12-12 NOTE — Telephone Encounter (Signed)
Patient called [name] drug closed needs all medication sent to [facility]   Need refills famotidine (PEPCID) 20 MG tablet [[id number]]   amLODipine (NORVASC) 5 MG tablet [[id number]]     Pharmacy  [facility] - [place], [place] - [address] [place] [address] Phone: [phone]  Fax: [phone] DEA #: --

## 2022-12-12 NOTE — Discharge Instructions (Signed)
Transfusion completed follow-up with your doctors.  To have it rechecked.  Sometime in the next week or 2.

## 2022-12-12 NOTE — Telephone Encounter (Signed)
Called and spoke with Liborio Nixon at [facility] [phone]). Relayed that patient hemoglobin was 7.4 per Hemocue at appointment today. VSS. Patient states she feels tired and short of breath, but states this is typical for her and denies any recent worsening of symptoms.  Spoke directly with Dr. Wolfgang Phoenix. Provider stated he would like patient to receive a unit of PRBCs and requested that we transfer patient to ED. Patient agreed to go to ED. AC notified. Infusion supervisor notified. Retacrit injection not administered today.   Per patient request, called and left voicemail for niece Carmon Sails. Called and spoke directly to niece Bjorn Loser and relayed plan to take patient to ED. Rhonda verbalized understanding and stated she would also try to contact Ms. Clemon Chambers.  Patient taken to ED. Report given to ED RN. Niece Marylu Lund at bedside.  Wyvonne Lenz, RN

## 2022-12-13 LAB — BPAM RBC
Blood Product Expiration Date: 202406262359
ISSUE DATE / TIME: 202406121501
Unit Type and Rh: 6200

## 2022-12-13 LAB — TYPE AND SCREEN
ABO/RH(D): A POS
Antibody Screen: NEGATIVE
Unit division: 0

## 2022-12-17 NOTE — Progress Notes (Signed)
[facility] [address] [place] [address]    Clinic Day:  12/18/2022  Referring physician: Kerri Perches, MD  Patient Care Team: Kerri Perches, MD as PCP - General (Family Medicine) Jonelle Sidle, MD as PCP - Cardiology (Cardiology) Luis Abed, MD as Consulting Physician (Cardiology) Sallye Lat, MD as Consulting Physician (Optometry) Adam Phenix, DPM as Consulting Physician (Podiatry) Sharlene Dory, NP as Nurse Practitioner (Cardiology) Doreatha Massed, MD as Medical Oncologist (Hematology and Oncology)   ASSESSMENT & PLAN:   Assessment: 1.  Duodenal neuroendocrine tumor: - 04/27/2021 (duodenal mass biopsy): Well-differentiated carcinoid tumor, grade 1, Ki-67 1%. - Dotatate PET (05/2021): No focal activity within the duodenum or evidence of metastatic disease. - EGD/EUS (08/2021): Submucosal nodule found in the duodenum-consistent with previously biopsied duodenal NET.  An intramural subepithelial lesion found in the second part of the duodenum.  Lesion appears to originate from within the deep mucosa and submucosa.  She was not felt to be candidate for EMR. - Patient had traumatic experience after the EUS and did not want to pursue further options and declined a referral to [facility]. - Denies any diarrhea/flushing/wheezing/abdominal pain.  No obstructive symptoms.   2.  Normocytic anemia: - More than 5-year history of iron deficiency anemia secondary to GI bleed.  She underwent multiple EGDs and colonoscopies and 2 small bowel capsule endoscopies. - She reports taking iron tablet daily for the past 3 years.  Denies any ice pica. - Denies any BRBPR/melena.  She had blood transfusion 2 weeks ago.   3.  Social/family history: - Lives at home with her husband.  Independent of ADLs and IADLs.  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  No chemical exposure.  Smoked half pack per day  starting at age 48 and quit in 2005. - Brother had colon cancer.  Older brother had liver cancer.    Plan: 1.  Normocytic anemia: - Multifactorial anemia from CKD, iron deficiency and chronic blood loss. - Status post 1 unit PRBC on 12/12/2022 for hemoglobin 7.6. - Last ferritin was 18 on 11/16/2022. - Recommend Feraheme weekly x 2. - Continue Retacrit 6000 units every 4 weeks, ordered by Dr. Wolfgang Phoenix.   2.  Duodenal carcinoid tumor: - Chromogranin (11/16/2022): 847 - 24-hour urine 5-HIAA (11/21/2022): Normal - Reviewed dotatate PET scan from 12/06/2022: No evidence of neuroendocrine tumor or metastasis.  3.  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  Kappa light chains of 56, lambda light chains 36 and ratio of 1.55.  Immunofixation shows IgG kappa monoclonal protein.  Calcium is normal and her creatinine is at baseline.  We will closely monitor at this time.    Orders Placed This Encounter  Procedures   CBC    Standing Status:   Future    Standing Expiration Date:   12/18/2023   Ferritin    Standing Status:   Future    Standing Expiration Date:   12/18/2023   Iron and TIBC ([facility] ONLY)    Standing Status:   Future    Standing Expiration Date:   12/18/2023      I,Katie Daubenspeck,acting as a scribe for Doreatha Massed, MD.,have documented all relevant documentation on the behalf of Doreatha Massed, MD,as directed by  Doreatha Massed, MD while in the presence of Doreatha Massed, MD.   I, Doreatha Massed MD, have reviewed the above documentation  for accuracy and completeness, and I agree with the above.   Doreatha Massed, MD   6/18/202412:47 PM  CHIEF COMPLAINT:   Diagnosis: history of NET and anemia    Cancer Staging  No matching staging information was found for the patient.   Prior Therapy: none  Current Therapy:   retacrit injections, monthly    HISTORY OF PRESENT ILLNESS:    Oncology History   No history exists.     INTERVAL HISTORY:   [name] [name] is a 82 y.o. female presenting to clinic today for follow up of history of NET and anemia. She was last seen by me on 11/16/22 in consultation.  Since her last visit, she underwent surveillance DOTATATE PET scan on 12/06/22 showing: no evidence of NET or mesenteric, nodal, visceral, or skeletal metastasis.  Today, she states that she is doing well overall. Her appetite level is at 100%. Her energy level is at 75%.  PAST MEDICAL HISTORY:   Past Medical History: Past Medical History:  Diagnosis Date   A-fib (HCC)    Blood transfusion without reported diagnosis    CAD (coronary artery disease)    Multivessel status post CABG October 2005   Carotid artery disease (HCC)    CKD (chronic kidney disease), stage III (HCC)    Essential hypertension    Glaucoma    Laser surgery 2010   Hyperlipidemia    Hypothyroidism    Iron deficiency anemia    Obesity     Surgical History: Past Surgical History:  Procedure Laterality Date   BIOPSY  04/27/2021   Procedure: BIOPSY;  Surgeon: Rachael Fee, MD;  Location: [facility];  Service: Endoscopy;;   COLONOSCOPY N/A 02/04/2013   Procedure: COLONOSCOPY;  Surgeon: Malissa Hippo, MD;  Location: [facility];  Service: Endoscopy;  Laterality: N/A;  1015   COLONOSCOPY N/A 12/21/2016   Procedure: COLONOSCOPY;  Surgeon: Malissa Hippo, MD;  [facility];  Service: Endoscopy;  Laterality: N/A;   COLONOSCOPY WITH PROPOFOL N/A 03/22/2021   Procedure: COLONOSCOPY WITH PROPOFOL;  Surgeon: Malissa Hippo, MD;  [facility];  Service: Endoscopy;  Laterality: N/A;  8:15   CORONARY ARTERY BYPASS GRAFT  October 2005   Dr. Cornelius Moras - LIMA to LAD, SVG to OM 2 and OM 3, SVG to PDA   ESOPHAGOGASTRODUODENOSCOPY N/A 12/21/2016   Procedure: ESOPHAGOGASTRODUODENOSCOPY (EGD);  Surgeon: Malissa Hippo, MD;  [facility];  Service: Endoscopy;  Laterality: N/A;   11:10   ESOPHAGOGASTRODUODENOSCOPY N/A 02/27/2018   Procedure: ESOPHAGOGASTRODUODENOSCOPY (EGD);  Surgeon: Malissa Hippo, MD;  [facility];  Service: Endoscopy;  Laterality: N/A;   ESOPHAGOGASTRODUODENOSCOPY (EGD) WITH PROPOFOL N/A 04/27/2021   Procedure: ESOPHAGOGASTRODUODENOSCOPY (EGD) WITH PROPOFOL;  Surgeon: Rachael Fee, MD;  Location: [facility];  Service: Endoscopy;  Laterality: N/A;   ESOPHAGOGASTRODUODENOSCOPY (EGD) WITH PROPOFOL N/A 08/17/2021   Procedure: ESOPHAGOGASTRODUODENOSCOPY (EGD) WITH PROPOFOL;  Surgeon: Meridee Score Netty Starring., MD;  Location: [facility] ENDOSCOPY;  Service: Gastroenterology;  Laterality: N/A;   EUS N/A 04/27/2021   Procedure: UPPER ENDOSCOPIC ULTRASOUND (EUS) RADIAL;  Surgeon: Rachael Fee, MD;  Location: [facility];  Service: Endoscopy;  Laterality: N/A;   EYE SURGERY  2010   laser treatment to both eyes for glaucoma   GIVENS CAPSULE STUDY N/A 03/07/2018   Procedure: GIVENS CAPSULE STUDY;  Surgeon: Malissa Hippo, MD;  [facility];  Service: Endoscopy;  Laterality: N/A;   GIVENS CAPSULE STUDY N/A 02/05/2020  Procedure: GIVENS CAPSULE STUDY;  Surgeon: Malissa Hippo, MD;  [facility];  Service: Endoscopy;  Laterality: N/A;  730   POLYPECTOMY  03/22/2021   Procedure: POLYPECTOMY;  Surgeon: Malissa Hippo, MD;  [facility];  Service: Endoscopy;;   UPPER ESOPHAGEAL ENDOSCOPIC ULTRASOUND (EUS) N/A 08/17/2021   Procedure: UPPER ESOPHAGEAL ENDOSCOPIC ULTRASOUND (EUS);  Surgeon: Lemar Lofty., MD;  Location: [facility] ENDOSCOPY;  Service: Gastroenterology;  Laterality: N/A;    Social History: Social History   Socioeconomic History   Marital status: Married    Spouse name: Not on file   Number of children: Not on file   Years of education: Not on file   Highest education level: Not on file  Occupational History   Occupation: retired   Tobacco Use   Smoking status: Former    Packs/day: 0.25     Years: 40.00    Additional pack years: 0.00    Total pack years: 10.00    Types: Cigarettes    Quit date: 07/03/2003    Years since quitting: 19.4    Passive exposure: Never   Smokeless tobacco: Never  Vaping Use   Vaping Use: Never used  Substance and Sexual Activity   Alcohol use: No    Alcohol/week: 0.0 standard drinks of alcohol   Drug use: No   Sexual activity: Never  Other Topics Concern   Not on file  Social History Narrative   Not on file   Social Determinants of Health   Financial Resource Strain: Low Risk  (12/11/2021)   Overall Financial Resource Strain (CARDIA)    Difficulty of Paying Living Expenses: Not hard at all  Food Insecurity: No Food Insecurity (10/24/2022)   Hunger Vital Sign    Worried About Running Out of Food in the Last Year: Never true    Ran Out of Food in the Last Year: Never true  Transportation Needs: No Transportation Needs (10/24/2022)   PRAPARE - Administrator, Civil Service (Medical): No    Lack of Transportation (Non-Medical): No  Physical Activity: Insufficiently Active (12/11/2021)   Exercise Vital Sign    Days of Exercise per Week: 5 days    Minutes of Exercise per Session: 10 min  Stress: No Stress Concern Present (12/11/2021)   [employer] of Occupational Health - Occupational Stress Questionnaire    Feeling of Stress : Not at all  Social Connections: Moderately Integrated (12/11/2021)   Social Connection and Isolation Panel [NHANES]    Frequency of Communication with Friends and Family: More than three times a week    Frequency of Social Gatherings with Friends and Family: More than three times a week    Attends Religious Services: More than 4 times per year    Active Member of [employer] or Organizations: No    Attends Banker Meetings: Never    Marital Status: Married  Catering manager Violence: Not At Risk (10/24/2022)   Humiliation, Afraid, Rape, and Kick questionnaire    Fear of Current or Ex-Partner: No     Emotionally Abused: No    Physically Abused: No    Sexually Abused: No    Family History: Family History  Problem Relation Age of Onset   Aneurysm Mother    Hypertension Mother    Coronary artery disease Mother    Coronary artery disease Father    Diabetes Sister    Coronary artery disease Sister    Diverticulosis Sister    COPD Sister  Kidney disease Sister    Diabetes Sister    Hypertension Sister    Breast cancer Sister 83   Hearing loss Sister    Heart Problems Sister        pacemaker   Colon cancer Brother 64       lived 18 months afterwards   Liver cancer Brother 44   Diabetes Son    Pancreatic cancer Son    Esophageal cancer Neg Hx    Inflammatory bowel disease Neg Hx    Stomach cancer Neg Hx     Current Medications:  Current Outpatient Medications:    amLODipine (NORVASC) 5 MG tablet, Take 1 tablet (5 mg total) by mouth daily. TAKE ONE TABLET BY MOUTH TWICE DAILY FOR BLOOD PRESSURE, Disp: 90 tablet, Rfl: 5   cetirizine (ZYRTEC) 10 MG tablet, Take 1 tablet (10 mg total) by mouth daily., Disp: 90 tablet, Rfl: 1   famotidine (PEPCID) 20 MG tablet, Take 1 tablet (20 mg total) by mouth 2 (two) times daily., Disp: 60 tablet, Rfl: 1   ferrous sulfate 325 (65 FE) MG tablet, Take 325 mg by mouth daily with breakfast., Disp: , Rfl:    fish oil-omega-3 fatty acids 1000 MG capsule, Take 1 g by mouth daily., Disp: , Rfl:    fluticasone (FLONASE) 50 MCG/ACT nasal spray, USE 2 SPRAYS IN EACH NOSTRIL ONCE DAILY. SHAKE GENTLY BEFORE USING., Disp: 48 g, Rfl: 1   hydrALAZINE (APRESOLINE) 100 MG tablet, Take one tablet by mouth once daily, Disp: 90 tablet, Rfl: 3   latanoprost (XALATAN) 0.005 % ophthalmic solution, Place 1 drop into both eyes at bedtime., Disp: , Rfl:    levothyroxine (SYNTHROID) 100 MCG tablet, Take 1 tablet (100 mcg total) by mouth daily., Disp: 90 tablet, Rfl: 1   metoprolol succinate (TOPROL-XL) 50 MG 24 hr tablet, Take 1 tablet (50 mg total) by mouth  daily. Take with or immediately following a meal., Disp: 90 tablet, Rfl: 1   nitroGLYCERIN (NITROSTAT) 0.4 MG SL tablet, Place 1 tablet (0.4 mg total) under the tongue every 5 (five) minutes x 3 doses as needed for chest pain (if no relief after 3rd dose, proceed to ED or call 911)., Disp: 75 tablet, Rfl: 1   Polyvinyl Alcohol-Povidone (REFRESH OP), Place 1 drop into both eyes at bedtime., Disp: , Rfl:    Rivaroxaban (XARELTO) 15 MG TABS tablet, Take 1 tablet (15 mg total) by mouth daily with supper., Disp: 21 tablet, Rfl: 0   rosuvastatin (CRESTOR) 10 MG tablet, Take 1 tablet (10 mg total) by mouth daily., Disp: 90 tablet, Rfl: 3   SYSTANE ULTRA 0.4-0.3 % SOLN, Place 1 drop into both eyes daily as needed (dry eyes)., Disp: , Rfl:    telmisartan (MICARDIS) 20 MG tablet, Take 0.5 tablets (10 mg total) by mouth daily., Disp: 15 tablet, Rfl: 11   trolamine salicylate (ASPERCREME) 10 % cream, Apply 1 application topically as needed for muscle pain., Disp: , Rfl:    UNABLE TO FIND, Shower chair x 1  DX M54.36, Disp: 1 each, Rfl: 0   UNABLE TO FIND, Tdap x 1 fax vaccine record to [phone], Disp: 1 each, Rfl: 0 No current facility-administered medications for this visit.  Facility-Administered Medications Ordered in Other Visits:    cetirizine (ZYRTEC) tablet 10 mg, 10 mg, Oral, Once, Doreatha Massed, MD   Allergies: Allergies  Allergen Reactions   Tramadol Itching   Lisinopril Cough   Shrimp (Diagnostic)     Gout flares  Clonidine Derivatives Other (See Comments)    Zombie BP dropped    REVIEW OF SYSTEMS:   Review of Systems  Constitutional:  Negative for chills, fatigue and fever.  HENT:   Negative for lump/mass, mouth sores, nosebleeds, sore throat and trouble swallowing.   Eyes:  Negative for eye problems.  Respiratory:  Negative for cough and shortness of breath.   Cardiovascular:  Positive for palpitations. Negative for chest pain and leg swelling.  Gastrointestinal:   Negative for abdominal pain, constipation, diarrhea, nausea and vomiting.  Genitourinary:  Negative for bladder incontinence, difficulty urinating, dysuria, frequency, hematuria and nocturia.   Musculoskeletal:  Negative for arthralgias, back pain, flank pain, myalgias and neck pain.  Skin:  Negative for itching and rash.  Neurological:  Positive for numbness. Negative for dizziness and headaches.  Hematological:  Does not bruise/bleed easily.  Psychiatric/Behavioral:  Negative for depression, sleep disturbance and suicidal ideas. The patient is not nervous/anxious.   All other systems reviewed and are negative.    VITALS:   Blood pressure 139/63, pulse 68, temperature (!) 96.6 F (35.9 C), temperature source Tympanic, resp. rate 20, weight 153 lb 8 oz (69.6 kg), SpO2 98 %.  Wt Readings from Last 3 Encounters:  12/18/22 153 lb 8 oz (69.6 kg)  12/12/22 148 lb 13 oz (67.5 kg)  11/29/22 149 lb (67.6 kg)    Body mass index is 29 kg/m.  Performance status (ECOG): 1 - Symptomatic but completely ambulatory  PHYSICAL EXAM:   Physical Exam Vitals and nursing note reviewed. Exam conducted with a chaperone present.  Constitutional:      Appearance: Normal appearance.  Cardiovascular:     Rate and Rhythm: Normal rate and regular rhythm.     Pulses: Normal pulses.     Heart sounds: Normal heart sounds.  Pulmonary:     Effort: Pulmonary effort is normal.     Breath sounds: Normal breath sounds.  Abdominal:     Palpations: Abdomen is soft. There is no hepatomegaly, splenomegaly or mass.     Tenderness: There is no abdominal tenderness.  Musculoskeletal:     Right lower leg: No edema.     Left lower leg: No edema.  Lymphadenopathy:     Cervical: No cervical adenopathy.     Right cervical: No superficial, deep or posterior cervical adenopathy.    Left cervical: No superficial, deep or posterior cervical adenopathy.     Upper Body:     Right upper body: No supraclavicular or axillary  adenopathy.     Left upper body: No supraclavicular or axillary adenopathy.  Neurological:     General: No focal deficit present.     Mental Status: She is alert and oriented to person, place, and time.  Psychiatric:        Mood and Affect: Mood normal.        Behavior: Behavior normal.     LABS:      Latest Ref Rng & Units 12/12/2022    2:00 PM 12/12/2022   11:00 AM 11/16/2022   11:39 AM  CBC  WBC 4.0 - 10.5 K/uL 4.9   5.6   Hemoglobin 12.0 - 15.0 g/dL 7.6  7.4  8.8   Hematocrit 36.0 - 46.0 % 25.2   27.9   Platelets 150 - 400 K/uL 218   247       Latest Ref Rng & Units 12/12/2022    2:00 PM 11/21/2022    1:09 PM 10/25/2022    3:55 AM  CMP  Glucose 70 - 99 mg/dL 99  93  78   BUN 8 - 23 mg/dL 25  35  40   Creatinine 0.44 - 1.00 mg/dL 1.61  0.96  0.45   Sodium 135 - 145 mmol/L 138  136  137   Potassium 3.5 - 5.1 mmol/L 4.4  3.7  3.9   Chloride 98 - 111 mmol/L 109  109  112   CO2 22 - 32 mmol/L 19  21  17    Calcium 8.9 - 10.3 mg/dL 40.9  9.9  9.8   Total Protein 6.5 - 8.1 g/dL 7.5     Total Bilirubin 0.3 - 1.2 mg/dL 0.4     Alkaline Phos 38 - 126 U/L 73     AST 15 - 41 U/L 21     ALT 0 - 44 U/L 16        No results found for: "CEA1", "CEA" / No results found for: "CEA1", "CEA" No results found for: "PSA1" No results found for: "CAN199" No results found for: "CAN125"  Lab Results  Component Value Date   TOTALPROTELP 7.2 11/16/2022   TOTALPROTELP 6.9 11/16/2022   ALBUMINELP 3.5 11/16/2022   A1GS 0.3 11/16/2022   A2GS 0.7 11/16/2022   BETS 1.0 11/16/2022   GAMS 1.4 11/16/2022   MSPIKE Not Observed 11/16/2022   SPEI Comment 11/16/2022   Lab Results  Component Value Date   TIBC 405 11/16/2022   TIBC 416 06/06/2022   TIBC 408 10/24/2021   FERRITIN 18 11/16/2022   FERRITIN 13 06/06/2022   FERRITIN 18 10/24/2021   IRONPCTSAT 72 (H) 11/16/2022   IRONPCTSAT 32 (H) 06/06/2022   IRONPCTSAT 40 (H) 10/24/2021   Lab Results  Component Value Date   LDH 152  11/16/2022     STUDIES:   NM PET DOTATATE SKULL BASE TO MID THIGH  Result Date: 12/06/2022 CLINICAL DATA:  Carcinoid tumor of the duodenum. Iron deficiency anemia. EXAM: NUCLEAR MEDICINE PET SKULL BASE TO THIGH TECHNIQUE: 4.0 mCi copper 64 DOTATATE was injected intravenously. Full-ring PET imaging was performed from the skull base to thigh after the radiotracer. CT data was obtained and used for attenuation correction and anatomic localization. COMPARISON:  DOTATATE PET scan 05/19/2021 FINDINGS: NECK No radiotracer activity in neck lymph nodes. Incidental CT findings: None CHEST No radiotracer accumulation within mediastinal or hilar lymph nodes. No suspicious pulmonary nodules on the CT scan. Incidental CT finding:Small RIGHT effusion.  Post CABG ABDOMEN/PELVIS No abnormal radiotracer activity within the duodenum. No evidence of metastatic disease in the mesentery. No radiotracer avid abdominopelvic lymph nodes. No abnormal radiotracer activity liver. Physiologic activity noted in the liver, spleen, adrenal glands and kidneys. Incidental CT findings:No evidence of bowel obstruction. Uterus and adnexa unremarkable. SKELETON No focal activity to suggest skeletal metastasis. Incidental CT findings:None IMPRESSION: 1. No evidence of primary neuroendocrine tumor within the duodenum. 2. No evidence of mesenteric metastasis or nodal metastasis. 3. No visceral metastasis or skeletal metastasis. 4. Small RIGHT pleural effusion. Electronically Signed   By: Genevive Bi M.D.   On: 12/06/2022 16:06

## 2022-12-18 ENCOUNTER — Inpatient Hospital Stay (HOSPITAL_BASED_OUTPATIENT_CLINIC_OR_DEPARTMENT_OTHER): Payer: Medicare Other | Admitting: Hematology

## 2022-12-18 ENCOUNTER — Telehealth: Payer: Self-pay

## 2022-12-18 ENCOUNTER — Inpatient Hospital Stay: Payer: Medicare Other | Attending: Hematology

## 2022-12-18 ENCOUNTER — Ambulatory Visit: Payer: Medicare Other | Admitting: Hematology

## 2022-12-18 VITALS — BP 133/54 | HR 62 | Temp 97.7°F | Resp 18

## 2022-12-18 VITALS — BP 139/63 | HR 68 | Temp 96.6°F | Resp 20 | Wt 153.5 lb

## 2022-12-18 DIAGNOSIS — R200 Anesthesia of skin: Secondary | ICD-10-CM | POA: Diagnosis not present

## 2022-12-18 DIAGNOSIS — E039 Hypothyroidism, unspecified: Secondary | ICD-10-CM | POA: Insufficient documentation

## 2022-12-18 DIAGNOSIS — Z800 Family history of malignant neoplasm of digestive organs: Secondary | ICD-10-CM | POA: Diagnosis not present

## 2022-12-18 DIAGNOSIS — I2510 Atherosclerotic heart disease of native coronary artery without angina pectoris: Secondary | ICD-10-CM | POA: Insufficient documentation

## 2022-12-18 DIAGNOSIS — C7A010 Malignant carcinoid tumor of the duodenum: Secondary | ICD-10-CM | POA: Diagnosis not present

## 2022-12-18 DIAGNOSIS — Z8249 Family history of ischemic heart disease and other diseases of the circulatory system: Secondary | ICD-10-CM | POA: Insufficient documentation

## 2022-12-18 DIAGNOSIS — Z825 Family history of asthma and other chronic lower respiratory diseases: Secondary | ICD-10-CM | POA: Diagnosis not present

## 2022-12-18 DIAGNOSIS — R002 Palpitations: Secondary | ICD-10-CM | POA: Diagnosis not present

## 2022-12-18 DIAGNOSIS — D509 Iron deficiency anemia, unspecified: Secondary | ICD-10-CM

## 2022-12-18 DIAGNOSIS — Z833 Family history of diabetes mellitus: Secondary | ICD-10-CM | POA: Insufficient documentation

## 2022-12-18 DIAGNOSIS — K922 Gastrointestinal hemorrhage, unspecified: Secondary | ICD-10-CM | POA: Insufficient documentation

## 2022-12-18 DIAGNOSIS — Z79899 Other long term (current) drug therapy: Secondary | ICD-10-CM | POA: Insufficient documentation

## 2022-12-18 DIAGNOSIS — D500 Iron deficiency anemia secondary to blood loss (chronic): Secondary | ICD-10-CM | POA: Insufficient documentation

## 2022-12-18 DIAGNOSIS — Z87891 Personal history of nicotine dependence: Secondary | ICD-10-CM | POA: Insufficient documentation

## 2022-12-18 DIAGNOSIS — Z7901 Long term (current) use of anticoagulants: Secondary | ICD-10-CM | POA: Insufficient documentation

## 2022-12-18 DIAGNOSIS — N1832 Chronic kidney disease, stage 3b: Secondary | ICD-10-CM | POA: Insufficient documentation

## 2022-12-18 DIAGNOSIS — I4891 Unspecified atrial fibrillation: Secondary | ICD-10-CM | POA: Insufficient documentation

## 2022-12-18 DIAGNOSIS — D472 Monoclonal gammopathy: Secondary | ICD-10-CM | POA: Insufficient documentation

## 2022-12-18 DIAGNOSIS — Z841 Family history of disorders of kidney and ureter: Secondary | ICD-10-CM | POA: Diagnosis not present

## 2022-12-18 DIAGNOSIS — I129 Hypertensive chronic kidney disease with stage 1 through stage 4 chronic kidney disease, or unspecified chronic kidney disease: Secondary | ICD-10-CM | POA: Insufficient documentation

## 2022-12-18 DIAGNOSIS — E785 Hyperlipidemia, unspecified: Secondary | ICD-10-CM | POA: Diagnosis not present

## 2022-12-18 DIAGNOSIS — Z822 Family history of deafness and hearing loss: Secondary | ICD-10-CM | POA: Insufficient documentation

## 2022-12-18 DIAGNOSIS — Z8379 Family history of other diseases of the digestive system: Secondary | ICD-10-CM | POA: Insufficient documentation

## 2022-12-18 MED ORDER — CETIRIZINE HCL 10 MG PO TABS: 10.0000 mg | ORAL_TABLET | Freq: Once | ORAL | Status: DC

## 2022-12-18 MED ORDER — SODIUM CHLORIDE 0.9 % IV SOLN: Freq: Once | INTRAVENOUS | Status: AC

## 2022-12-18 MED ORDER — ACETAMINOPHEN 325 MG PO TABS
650.0000 mg | ORAL_TABLET | Freq: Once | ORAL | Status: AC
  Administered 2022-12-18: 650 mg via ORAL
  Filled 2022-12-18: qty 2

## 2022-12-18 MED ORDER — SODIUM CHLORIDE 0.9 % IV SOLN
510.0000 mg | Freq: Once | INTRAVENOUS | Status: AC
  Administered 2022-12-18: 510 mg via INTRAVENOUS
  Filled 2022-12-18: qty 510
  Filled 2022-12-18: qty 17

## 2022-12-18 NOTE — Telephone Encounter (Signed)
Transition Care Management Unsuccessful Follow-up Telephone Call  Date of discharge and from where:  12/12/2022 [facility]  Attempts:  1st Attempt  Reason for unsuccessful TCM follow-up call:  No answer/busy  Daune Divirgilio [facility]  [facility] Population Health Community Resource Care Guide   [email or web address]  ?? [phone]   Website: [email or web address]  [email or web address]

## 2022-12-18 NOTE — Patient Instructions (Addendum)
[facility] at [facility] Discharge Instructions   You were seen and examined today by Dr. Ellin Saba.  He reviewed the result of the PET scan you had done. There is no evidence of a neuroendocrine tumor seen on the exam.   He reviewed the results of your lab work. You would benefit from receiving IV iron infusions. We will arrange for you to have 2 iron infusions in our clinic. The infusions are given one week apart.   Return as scheduled.    Thank you for choosing  [facility] Center at [facility] to provide your oncology and hematology care.  To afford each patient quality time with our provider, please arrive at least 15 minutes before your scheduled appointment time.   If you have a lab appointment with the Cancer Center please come in thru the Main Entrance and check in at the main information desk.  You need to re-schedule your appointment should you arrive 10 or more minutes late.  We strive to give you quality time with our providers, and arriving late affects you and other patients whose appointments are after yours.  Also, if you no show three or more times for appointments you may be dismissed from the clinic at the providers discretion.     Again, thank you for choosing [facility].  Our hope is that these requests will decrease the amount of time that you wait before being seen by our physicians.       _____________________________________________________________  Should you have questions after your visit to [facility], please contact our office at [phone] and follow the prompts.  Our office hours are 8:00 a.m. and 4:30 p.m. Monday - Friday.  Please note that voicemails left after 4:00 p.m. may not be returned until the following business day.  We are closed weekends and major holidays.  You do have access to a nurse 24-7, just call the main number to the clinic [phone] and do not press any options, hold  on the line and a nurse will answer the phone.    For prescription refill requests, have your pharmacy contact our office and allow 72 hours.    Due to Covid, you will need to wear a mask upon entering the hospital. If you do not have a mask, a mask will be given to you at the Main Entrance upon arrival. For doctor visits, patients may have 1 support person age 60 or older with them. For treatment visits, patients can not have anyone with them due to social distancing guidelines and our immunocompromised population.

## 2022-12-18 NOTE — Progress Notes (Addendum)
Patient presents today for iron infusion. Patient is in satisfactory condition with no new complaints voiced.  Vital signs are stable.  We will proceed with infusion per provider orders.    Pt took Zyrtec at home prior to arrival. Peripheral IV started with good blood return pre and post infusion.  Feraheme 510 mg given today per MD orders. Tolerated infusion without adverse affects. Vital signs stable. No complaints at this time. Discharged from clinic via wheelchair in stable condition. Alert and oriented x 3. F/U with [facility] as scheduled.

## 2022-12-18 NOTE — Patient Instructions (Signed)
[facility]  Discharge Instructions: Thank you for choosing [facility] to provide your oncology and hematology care.  If you have a lab appointment with the Cancer Center - please note that after April 8th, 2024, all labs will be drawn in the cancer center.  You do not have to check in or register with the main entrance as you have in the past but will complete your check-in in the cancer center.  Wear comfortable clothing and clothing appropriate for easy access to any Portacath or PICC line.   We strive to give you quality time with your provider. You may need to reschedule your appointment if you arrive late (15 or more minutes).  Arriving late affects you and other patients whose appointments are after yours.  Also, if you miss three or more appointments without notifying the office, you may be dismissed from the clinic at the provider's discretion.      For prescription refill requests, have your pharmacy contact our office and allow 72 hours for refills to be completed.    Today you received Feraheme IV iron infusion.   .  BELOW ARE SYMPTOMS THAT SHOULD BE REPORTED IMMEDIATELY: *FEVER GREATER THAN 100.4 F (38 C) OR HIGHER *CHILLS OR SWEATING *NAUSEA AND VOMITING THAT IS NOT CONTROLLED WITH YOUR NAUSEA MEDICATION *UNUSUAL SHORTNESS OF BREATH *UNUSUAL BRUISING OR BLEEDING *URINARY PROBLEMS (pain or burning when urinating, or frequent urination) *BOWEL PROBLEMS (unusual diarrhea, constipation, pain near the anus) TENDERNESS IN MOUTH AND THROAT WITH OR WITHOUT PRESENCE OF ULCERS (sore throat, sores in mouth, or a toothache) UNUSUAL RASH, SWELLING OR PAIN  UNUSUAL VAGINAL DISCHARGE OR ITCHING   Items with * indicate a potential emergency and should be followed up as soon as possible or go to the Emergency Department if any problems should occur.  Please show the CHEMOTHERAPY ALERT CARD or IMMUNOTHERAPY ALERT CARD at check-in to the Emergency  Department and triage nurse.  Should you have questions after your visit or need to cancel or reschedule your appointment, please contact [facility] [phone]  and follow the prompts.  Office hours are 8:00 a.m. to 4:30 p.m. Monday - Friday. Please note that voicemails left after 4:00 p.m. may not be returned until the following business day.  We are closed weekends and major holidays. You have access to a nurse at all times for urgent questions. Please call the main number to the clinic [phone] and follow the prompts.  For any non-urgent questions, you may also contact your provider using MyChart. We now offer e-Visits for anyone 18 and older to request care online for non-urgent symptoms. For details visit [email or web address].   Also download the MyChart app! Go to the app store, search "MyChart", open the app, select [facility], and log in with your MyChart username and password.   

## 2022-12-19 ENCOUNTER — Telehealth: Payer: Self-pay

## 2022-12-19 ENCOUNTER — Encounter (HOSPITAL_COMMUNITY): Payer: Medicare Other

## 2022-12-19 ENCOUNTER — Other Ambulatory Visit: Payer: Self-pay

## 2022-12-19 ENCOUNTER — Ambulatory Visit (INDEPENDENT_AMBULATORY_CARE_PROVIDER_SITE_OTHER): Payer: Medicare Other

## 2022-12-19 VITALS — Ht 61.0 in | Wt 150.0 lb

## 2022-12-19 DIAGNOSIS — Z0000 Encounter for general adult medical examination without abnormal findings: Secondary | ICD-10-CM | POA: Diagnosis not present

## 2022-12-19 MED ORDER — AMLODIPINE BESYLATE 5 MG PO TABS: 5.0000 mg | ORAL_TABLET | Freq: Every day | ORAL | 5 refills | Status: DC

## 2022-12-19 NOTE — Progress Notes (Signed)
 Subjective:   [name] [name] is a 82 y.o. female who presents for Medicare Annual (Subsequent) preventive examination.  Visit Complete: Virtual  I connected with  [name] [name] on 12/19/22 by a audio enabled telemedicine application and verified that I am speaking with the correct person using two identifiers.  Patient Location: Home  Provider Location: Home Office  I discussed the limitations of evaluation and management by telemedicine. The patient expressed understanding and agreed to proceed.  Patient Medicare AWV questionnaire was completed by the patient on na; I have confirmed that all information answered by patient is correct and no changes since this date.  Review of Systems     Cardiac Risk Factors include: advanced age (>62men, >27 women);dyslipidemia;hypertension;sedentary lifestyle     Objective:    Today's Vitals   12/19/22 0906 12/19/22 0907  Weight: 150 lb (68 kg)   Height: 5\' 1"  (1.549 m)   PainSc:  0-No pain   Body mass index is 28.34 kg/m.     12/19/2022    9:15 AM 12/18/2022   12:25 PM 12/18/2022   11:06 AM 12/12/2022   12:11 PM 11/16/2022   11:12 AM 10/24/2022    8:53 PM 12/11/2021    1:24 PM  Advanced Directives  Does Patient Have a Medical Advance Directive? No No No No No No No  Would patient like information on creating a medical advance directive? No - Patient declined No - Patient declined No - Patient declined  No - Patient declined Yes (Inpatient - patient requests chaplain consult to create a medical advance directive) No - Patient declined    Current Medications (verified) Outpatient Encounter Medications as of 12/19/2022  Medication Sig   amLODipine (NORVASC) 5 MG tablet Take 1 tablet (5 mg total) by mouth daily. TAKE ONE TABLET BY MOUTH TWICE DAILY FOR BLOOD PRESSURE   cetirizine (ZYRTEC) 10 MG tablet Take 1 tablet (10 mg total) by mouth daily.   famotidine (PEPCID) 20 MG tablet Take 1 tablet (20 mg total) by mouth 2 (two) times daily.    ferrous sulfate 325 (65 FE) MG tablet Take 325 mg by mouth daily with breakfast.   fish oil-omega-3 fatty acids 1000 MG capsule Take 1 g by mouth daily.   fluticasone (FLONASE) 50 MCG/ACT nasal spray USE 2 SPRAYS IN EACH NOSTRIL ONCE DAILY. SHAKE GENTLY BEFORE USING.   hydrALAZINE (APRESOLINE) 100 MG tablet Take one tablet by mouth once daily   latanoprost (XALATAN) 0.005 % ophthalmic solution Place 1 drop into both eyes at bedtime.   levothyroxine (SYNTHROID) 100 MCG tablet Take 1 tablet (100 mcg total) by mouth daily.   metoprolol succinate (TOPROL-XL) 50 MG 24 hr tablet Take 1 tablet (50 mg total) by mouth daily. Take with or immediately following a meal.   nitroGLYCERIN (NITROSTAT) 0.4 MG SL tablet Place 1 tablet (0.4 mg total) under the tongue every 5 (five) minutes x 3 doses as needed for chest pain (if no relief after 3rd dose, proceed to ED or call 911).   Polyvinyl Alcohol-Povidone (REFRESH OP) Place 1 drop into both eyes at bedtime.   Rivaroxaban (XARELTO) 15 MG TABS tablet Take 1 tablet (15 mg total) by mouth daily with supper.   rosuvastatin (CRESTOR) 10 MG tablet Take 1 tablet (10 mg total) by mouth daily.   SYSTANE ULTRA 0.4-0.3 % SOLN Place 1 drop into both eyes daily as needed (dry eyes).   telmisartan (MICARDIS) 20 MG tablet Take 0.5 tablets (10 mg total) by mouth  daily.   trolamine salicylate (ASPERCREME) 10 % cream Apply 1 application topically as needed for muscle pain.   UNABLE TO FIND Shower chair x 1  DX M54.36   UNABLE TO FIND Tdap x 1 fax vaccine record to [phone] (Patient not taking: Reported on 12/19/2022)   No facility-administered encounter medications on file as of 12/19/2022.    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  2005   Carotid artery disease (HCC)    CKD (chronic kidney disease), stage III (HCC)    Essential hypertension    Glaucoma    Laser surgery 2010   Hyperlipidemia    Hypothyroidism    Iron deficiency anemia    Obesity    Past Surgical History:  Procedure Laterality Date   BIOPSY  04/27/2021   Procedure: BIOPSY;  Surgeon: Rachael Fee, MD;  Location: [facility];  Service: Endoscopy;;   COLONOSCOPY N/A 02/04/2013   Procedure: COLONOSCOPY;  Surgeon: Malissa Hippo, MD;  [facility];  Service: Endoscopy;  Laterality: N/A;  1015   COLONOSCOPY N/A 12/21/2016   Procedure: COLONOSCOPY;  Surgeon: Malissa Hippo, MD;  [facility];  Service: Endoscopy;  Laterality: N/A;   COLONOSCOPY WITH PROPOFOL N/A 03/22/2021   Procedure: COLONOSCOPY WITH PROPOFOL;  Surgeon: Malissa Hippo, MD;  [facility];  Service: Endoscopy;  Laterality: N/A;  8:15   CORONARY ARTERY BYPASS GRAFT  October 2005   Dr. Cornelius Moras - LIMA to LAD, SVG to OM 2 and OM 3, SVG to PDA   ESOPHAGOGASTRODUODENOSCOPY N/A 12/21/2016   Procedure: ESOPHAGOGASTRODUODENOSCOPY (EGD);  Surgeon: Malissa Hippo, MD;  [facility];  Service: Endoscopy;  Laterality: N/A;  11:10   ESOPHAGOGASTRODUODENOSCOPY N/A 02/27/2018   Procedure: ESOPHAGOGASTRODUODENOSCOPY (EGD);  Surgeon: Malissa Hippo, MD;  [facility];  Service: Endoscopy;  Laterality: N/A;   ESOPHAGOGASTRODUODENOSCOPY (EGD) WITH PROPOFOL N/A 04/27/2021   Procedure: ESOPHAGOGASTRODUODENOSCOPY (EGD) WITH PROPOFOL;  Surgeon: Rachael Fee, MD;  [facility]: [facility];  Service: Endoscopy;  Laterality: N/A;   ESOPHAGOGASTRODUODENOSCOPY (EGD) WITH PROPOFOL N/A 08/17/2021   Procedure: ESOPHAGOGASTRODUODENOSCOPY (EGD) WITH PROPOFOL;  Surgeon: Meridee Score Netty Starring., MD;  Location[facility] ENDOSCOPY;  Service: Gastroenterology;  Laterality: N/A;   EUS N/A 04/27/2021   Procedure: UPPER ENDOSCOPIC ULTRASOUND (EUS) RADIAL;  Surgeon: Rachael Fee, MD;  Location: [facility];  Service: Endoscopy;  Laterality: N/A;   EYE SURGERY  2010   laser treatment to both eyes for glaucoma   GIVENS CAPSULE STUDY N/A 03/07/2018   Procedure: GIVENS CAPSULE STUDY;  Surgeon: Malissa Hippo, MD;  [facility];  Service: Endoscopy;  Laterality: N/A;   GIVENS CAPSULE STUDY N/A 02/05/2020   Procedure: GIVENS CAPSULE STUDY;  Surgeon: Malissa Hippo, MD;  [facility];  Service: Endoscopy;  Laterality: N/A;  730   POLYPECTOMY  03/22/2021   Procedure: POLYPECTOMY;  Surgeon: Malissa Hippo, MD;  [facility];  Service: Endoscopy;;   UPPER ESOPHAGEAL ENDOSCOPIC ULTRASOUND (EUS) N/A 08/17/2021   Procedure: UPPER ESOPHAGEAL ENDOSCOPIC ULTRASOUND (EUS);  Surgeon: Lemar Lofty., MD;  Location: [facility] ENDOSCOPY;  Service: Gastroenterology;  Laterality: N/A;   Family History  Problem Relation Age of Onset   Aneurysm Mother  Hypertension Mother    Coronary artery disease Mother    Coronary artery disease Father    Diabetes Sister    Coronary artery disease Sister    Diverticulosis Sister    COPD Sister    Kidney disease Sister    Diabetes Sister    Hypertension Sister    Breast cancer Sister 67   Hearing loss Sister    Heart Problems Sister        pacemaker   Colon cancer Brother 64       lived 18 months afterwards   Liver cancer Brother 27   Diabetes Son    Pancreatic cancer Son    Esophageal cancer Neg Hx    Inflammatory bowel disease Neg Hx    Stomach cancer Neg Hx    Social History   Socioeconomic History   Marital status: Married    Spouse name: Not on file   Number of children: Not on file   Years of education: Not on file   Highest education level: Not on file  Occupational History   Occupation: retired   Tobacco Use   Smoking status: Former    Packs/day: 0.25    Years: 40.00    Additional pack years: 0.00    Total pack years: 10.00    Types: Cigarettes    Quit date: 07/03/2003     Years since quitting: 19.4    Passive exposure: Never   Smokeless tobacco: Never  Vaping Use   Vaping Use: Never used  Substance and Sexual Activity   Alcohol use: No    Alcohol/week: 0.0 standard drinks of alcohol   Drug use: No   Sexual activity: Never  Other Topics Concern   Not on file  Social History Narrative   Not on file   Social Determinants of Health   Financial Resource Strain: Low Risk  (12/19/2022)   Overall Financial Resource Strain (CARDIA)    Difficulty of Paying Living Expenses: Not hard at all  Food Insecurity: No Food Insecurity (12/19/2022)   Hunger Vital Sign    Worried About Running Out of Food in the Last Year: Never true    Ran Out of Food in the Last Year: Never true  Transportation Needs: No Transportation Needs (12/19/2022)   PRAPARE - Administrator, Civil Service (Medical): No    Lack of Transportation (Non-Medical): No  Physical Activity: Insufficiently Active (12/19/2022)   Exercise Vital Sign    Days of Exercise per Week: 5 days    Minutes of Exercise per Session: 10 min  Stress: No Stress Concern Present (12/19/2022)   [employer] of Occupational Health - Occupational Stress Questionnaire    Feeling of Stress : Not at all  Social Connections: Socially Integrated (12/19/2022)   Social Connection and Isolation Panel [NHANES]    Frequency of Communication with Friends and Family: More than three times a week    Frequency of Social Gatherings with Friends and Family: More than three times a week    Attends Religious Services: More than 4 times per year    Active Member of [employer] or Organizations: Yes    Attends Engineer, structural: More than 4 times per year    Marital Status: Married    Tobacco Counseling Counseling given: Yes   Clinical Intake:  Pre-visit preparation completed: Yes  Pain : No/denies pain Pain Score: 0-No pain     BMI - recorded: 28.34 Nutritional Status: BMI 25 -29 Overweight Nutritional  Risks: None  Diabetes: No  How often do you need to have someone help you when you read instructions, pamphlets, or other written materials from your doctor or pharmacy?: 1 - Never  Interpreter Needed?: No  Information entered by ::  Aakash Hollomon CMA   Activities of Daily Living    12/19/2022    9:17 AM 10/24/2022    8:53 PM  In your present state of health, do you have any difficulty performing the following activities:  Hearing? 0 0  Vision? 0 0  Difficulty concentrating or making decisions? 0 0  Walking or climbing stairs? 0 1  Dressing or bathing? 0 0  Doing errands, shopping? 0 0  Preparing Food and eating ? N   Using the Toilet? N   In the past six months, have you accidently leaked urine? N   Do you have problems with loss of bowel control? N   Managing your Medications? N   Managing your Finances? N   Housekeeping or managing your Housekeeping? N     Patient Care Team: Kerri Perches, MD as PCP - General (Family Medicine) Jonelle Sidle, MD as PCP - Cardiology (Cardiology) Luis Abed, MD as Consulting Physician (Cardiology) Sallye Lat, MD as Consulting Physician (Optometry) Adam Phenix, DPM as Consulting Physician (Podiatry) Sharlene Dory, NP as Nurse Practitioner (Cardiology) Doreatha Massed, MD as Medical Oncologist (Hematology and Oncology)  Indicate any recent Medical Services you may have received from other than [facility] providers in the past year (date may be approximate).     Assessment:   This is a routine wellness examination for [name] [name].  Hearing/Vision screen Hearing Screening - Comments:: Patient denies any hearing difficulties.   Vision Screening - Comments:: Wears rx glasses - up to date with routine eye exams with  Dr. Dione Booze  Dietary issues and exercise activities discussed:     Goals Addressed             This Visit's Progress    Patient Stated       Patients goal is to be as well as possible        Depression Screen    12/19/2022    9:13 AM 10/02/2022    1:12 PM 05/15/2022    1:10 PM 12/11/2021    1:25 PM 12/11/2021    1:22 PM 08/10/2021    1:00 PM 05/10/2021    1:05 PM  PHQ 2/9 Scores  PHQ - 2 Score 0 0 0 0 0 0 0  PHQ- 9 Score  1     0    Fall Risk    12/19/2022    9:15 AM 10/02/2022    1:12 PM 05/15/2022    1:10 PM 12/11/2021    1:24 PM 10/12/2021    1:08 PM  Fall Risk   Falls in the past year? 0 0 0 0 0  Number falls in past yr: 0 0 0 0 0  Injury with Fall? 0 0 0 0 0  Risk for fall due to : History of fall(s);Impaired balance/gait;Orthopedic patient Impaired balance/gait No Fall Risks No Fall Risks   Follow up Education provided;Falls prevention discussed Falls evaluation completed Falls evaluation completed Falls evaluation completed     MEDICARE RISK AT HOME:  Medicare Risk at Home - 12/19/22 0912     Any stairs in or around the home? No    If so, are there any without handrails? No    Home free of loose throw rugs in walkways, pet beds, electrical cords,  etc? Yes    Adequate lighting in your home to reduce risk of falls? Yes    Life alert? No    Use of a cane, walker or w/c? Yes    Grab bars in the bathroom? No    Shower chair or bench in shower? Yes    Elevated toilet seat or a handicapped toilet? Yes             TIMED UP AND GO:  Was the test performed?  No    Cognitive Function:    12/11/2021    1:25 PM  MMSE - Mini Mental State Exam  Not completed: Unable to complete        12/19/2022    9:17 AM 12/11/2021    1:26 PM 11/16/2019   10:13 AM 11/11/2018    8:45 AM 11/08/2017    9:44 AM  6CIT Screen  What Year? 0 points 0 points 0 points 0 points 0 points  What month? 0 points 0 points 0 points 0 points 0 points  What time? 0 points 0 points 0 points 0 points 0 points  Count back from 20 0 points 0 points 0 points 0 points 0 points  Months in reverse 0 points 0 points 0 points 0 points 0 points  Repeat phrase 0 points 0 points 0 points 4 points 0  points  Total Score 0 points 0 points 0 points 4 points 0 points    Immunizations Immunization History  Administered Date(s) Administered   COVID-19, mRNA, vaccine(Comirnaty)12 years and older 04/24/2022   Fluad Quad(high Dose 65+) 03/31/2019, 04/28/2020, 02/23/2021, 04/13/2022   Influenza Split 03/11/2012   Influenza,inj,Quad PF,6+ Mos 04/02/2013, 05/17/2014, 06/29/2015, 06/27/2016, 02/25/2017, 03/04/2018   PFIZER(Purple Top)SARS-COV-2 Vaccination 08/07/2019, 08/28/2019, 04/21/2020, 11/16/2020, 05/17/2021   Pfizer Covid-19 Vaccine Bivalent Booster 80yrs & up 04/18/2022   Pneumococcal Conjugate-13 01/04/2014   Pneumococcal Polysaccharide-23 09/25/2007   Rsv, Bivalent, Protein Subunit Rsvpref,pf Verdis Frederickson) 04/03/2022   Td 09/25/2007   Zoster Recombinat (Shingrix) 10/12/2021, 03/15/2022   Zoster, Live 03/12/2012    TDAP status: Up to date Patient states she had TDAP done at [facility] in [place] which has since closed  Flu Vaccine status: Up to date  Pneumococcal vaccine status: Up to date  Covid-19 vaccine status: Information provided on how to obtain vaccines.   Qualifies for Shingles Vaccine? Yes   Zostavax completed Yes   Shingrix Completed?: Yes  Screening Tests Health Maintenance  Topic Date Due   DTaP/Tdap/Td (2 - Tdap) 09/24/2017   COVID-19 Vaccine (7 - 2023-24 season) 06/19/2022   INFLUENZA VACCINE  01/31/2023   Medicare Annual Wellness (AWV)  12/19/2023   Colonoscopy  03/22/2026   Pneumonia Vaccine 69+ Years old  Completed   DEXA SCAN  Completed   Zoster Vaccines- Shingrix  Completed   HPV VACCINES  Aged Out   Hepatitis C Screening  Discontinued    Health Maintenance  Health Maintenance Due  Topic Date Due   DTaP/Tdap/Td (2 - Tdap) 09/24/2017   COVID-19 Vaccine (7 - 2023-24 season) 06/19/2022    Colorectal cancer screening: Type of screening: Colonoscopy. Completed 03/22/21. Repeat every 5 years  Mammogram status: Ordered 10/02/2022. Pt provided with  contact info and advised to call to schedule appt. Patient is scheduled to have mammogram on 01/02/23  Bone Density status: Completed 12/21/2019. Results reflect: Bone density results: NORMAL. Repeat every 5 years.  Lung Cancer Screening: (Low Dose CT Chest recommended if Age 36-80 years, 20 pack-year currently smoking OR have quit  w/in 15years.) does not qualify.   Additional Screening:  Hepatitis C Screening: does not qualify; Completed 04/14/2020  Vision Screening: Recommended annual ophthalmology exams for early detection of glaucoma and other disorders of the eye. Is the patient up to date with their annual eye exam?  Yes  Who is the provider or what is the name of the office in which the patient attends annual eye exams? Dr. Dione Booze If pt is not established with a provider, would they like to be referred to a provider to establish care? No .   Dental Screening: Recommended annual dental exams for proper oral hygiene  Diabetic Foot Exam: N/A  Community Resource Referral / Chronic Care Management: CRR required this visit?  No   CCM required this visit?  No     Plan:     I have personally reviewed and noted the following in the patient's chart:   Medical and social history Use of alcohol, tobacco or illicit drugs  Current medications and supplements including opioid prescriptions. Patient is not currently taking opioid prescriptions. Functional ability and status Nutritional status Physical activity Advanced directives List of other physicians Hospitalizations, surgeries, and ER visits in previous 12 months Vitals Screenings to include cognitive, depression, and falls Referrals and appointments  In addition, I have reviewed and discussed with patient certain preventive protocols, quality metrics, and best practice recommendations. A written personalized care plan for preventive services as well as general preventive health recommendations were provided to patient.      Jordan Hawks Belita Warsame, CMA   12/19/2022   After Visit Summary: (Mail) Due to this being a telephonic visit, the after visit summary with patients personalized plan was offered to patient via mail   Nurse Notes:

## 2022-12-19 NOTE — Telephone Encounter (Signed)
Transition Care Management Follow-up Telephone Call Date of discharge and from where: 12/12/2022 [facility] How have you been since you were released from the hospital? Patient is feeling better Any questions or concerns? No  Items Reviewed: Did the pt receive and understand the discharge instructions provided? Yes  Medications obtained and verified? Yes  Other?  Patient was very pleased with the care she received. Any new allergies since your discharge? No  Dietary orders reviewed? Yes Do you have support at home? Yes   Follow up appointments reviewed:  PCP Hospital f/u appt confirmed? No  Scheduled to see  on  @ . Specialist Hospital f/u appt confirmed? Yes  Scheduled to see Doreatha Massed, MD on 12/18/2022 @ [facility]. Are transportation arrangements needed? No  If their condition worsens, is the pt aware to call PCP or go to the Emergency Dept.? Yes Was the patient provided with contact information for the PCP's office or ED? Yes Was to pt encouraged to call back with questions or concerns? Yes  [name]  [facility] Population Health Community Resource Care Guide   ??[email or web address]  ?? [phone]   Website: [email or web address]

## 2022-12-19 NOTE — Patient Instructions (Signed)
[name] [name] , Thank you for taking time to come for your Medicare Wellness Visit. I appreciate your ongoing commitment to your health goals. Please review the following plan we discussed and let me know if I can assist you in the future.   These are the goals we discussed:  Goals      Patient Stated     Stay in good health.     Patient Stated     Patients goal is to be as well as possible        This is a list of the screening recommended for you and due dates:  Health Maintenance  Topic Date Due   DTaP/Tdap/Td vaccine (2 - Tdap) 09/24/2017   COVID-19 Vaccine (7 - 2023-24 season) 06/19/2022   Flu Shot  01/31/2023   Medicare Annual Wellness Visit  12/19/2023   Colon Cancer Screening  03/22/2026   Pneumonia Vaccine  Completed   DEXA scan (bone density measurement)  Completed   Zoster (Shingles) Vaccine  Completed   HPV Vaccine  Aged Out   Hepatitis C Screening  Discontinued    Advanced directives: Advance directive discussed with you today. Even though you declined this today, please call our office should you change your mind, and we can give you the proper paperwork for you to fill out. Advance care planning is a way to make decisions about medical care that fits your values in case you are ever unable to make these decisions for yourself.  Information on Advanced Care Planning can be found at [facility] of [facility] Advance Health Care Directives Advance Health Care Directives ([email or web address]    Conditions/risks identified: You are due for the following vaccines:Covid Booster You can have these done at your preferred pharmacy. Please have them send [place] documentation of the vaccines given so that we can update your chart.    Next appointment: telephone visit Follow up in one year for your annual wellness visit  January 15, 2024 at 9am telephone visit   Preventive Care 65 Years and Older, Female Preventive care refers to lifestyle choices and visits with your health care  provider that can promote health and wellness. What does preventive care include? A yearly physical exam. This is also called an annual well check. Dental exams once or twice a year. Routine eye exams. Ask your health care provider how often you should have your eyes checked. Personal lifestyle choices, including: Daily care of your teeth and gums. Regular physical activity. Eating a healthy diet. Avoiding tobacco and drug use. Limiting alcohol use. Practicing safe sex. Taking low-dose aspirin every day. Taking vitamin and mineral supplements as recommended by your health care provider. What happens during an annual well check? The services and screenings done by your health care provider during your annual well check will depend on your age, overall health, lifestyle risk factors, and family history of disease. Counseling  Your health care provider may ask you questions about your: Alcohol use. Tobacco use. Drug use. Emotional well-being. Home and relationship well-being. Sexual activity. Eating habits. History of falls. Memory and ability to understand (cognition). Work and work Astronomer. Reproductive health. Screening  You may have the following tests or measurements: Height, weight, and BMI. Blood pressure. Lipid and cholesterol levels. These may be checked every 5 years, or more frequently if you are over 76 years old. Skin check. Lung cancer screening. You may have this screening every year starting at age 62 if you have a 30-pack-year history of smoking and  currently smoke or have quit within the past 15 years. Fecal occult blood test (FOBT) of the stool. You may have this test every year starting at age 44. Flexible sigmoidoscopy or colonoscopy. You may have a sigmoidoscopy every 5 years or a colonoscopy every 10 years starting at age 73. Hepatitis C blood test. Hepatitis B blood test. Sexually transmitted disease (STD) testing. Diabetes screening. This is done by  checking your blood sugar (glucose) after you have not eaten for a while (fasting). You may have this done every 1-3 years. Bone density scan. This is done to screen for osteoporosis. You may have this done starting at age 74. Mammogram. This may be done every 1-2 years. Talk to your health care provider about how often you should have regular mammograms. Talk with your health care provider about your test results, treatment options, and if necessary, the need for more tests. Vaccines  Your health care provider may recommend certain vaccines, such as: Influenza vaccine. This is recommended every year. Tetanus, diphtheria, and acellular pertussis (Tdap, Td) vaccine. You may need a Td booster every 10 years. Zoster vaccine. You may need this after age 64. Pneumococcal 13-valent conjugate (PCV13) vaccine. One dose is recommended after age 34. Pneumococcal polysaccharide (PPSV23) vaccine. One dose is recommended after age 68. Talk to your health care provider about which screenings and vaccines you need and how often you need them. This information is not intended to replace advice given to you by your health care provider. Make sure you discuss any questions you have with your health care provider. Document Released: 07/15/2015 Document Revised: 03/07/2016 Document Reviewed: 04/19/2015 Elsevier Interactive Patient Education  2017 [employer].  Fall Prevention in the Home Falls can cause injuries. They can happen to people of all ages. There are many things you can do to make your home safe and to help prevent falls. What can I do on the outside of my home? Regularly fix the edges of walkways and driveways and fix any cracks. Remove anything that might make you trip as you walk through a door, such as a raised step or threshold. Trim any bushes or trees on the path to your home. Use bright outdoor lighting. Clear any walking paths of anything that might make someone trip, such as rocks or  tools. Regularly check to see if handrails are loose or broken. Make sure that both sides of any steps have handrails. Any raised decks and porches should have guardrails on the edges. Have any leaves, snow, or ice cleared regularly. Use sand or salt on walking paths during winter. Clean up any spills in your garage right away. This includes oil or grease spills. What can I do in the bathroom? Use night lights. Install grab bars by the toilet and in the tub and shower. Do not use towel bars as grab bars. Use non-skid mats or decals in the tub or shower. If you need to sit down in the shower, use a plastic, non-slip stool. Keep the floor dry. Clean up any water that spills on the floor as soon as it happens. Remove soap buildup in the tub or shower regularly. Attach bath mats securely with double-sided non-slip rug tape. Do not have throw rugs and other things on the floor that can make you trip. What can I do in the bedroom? Use night lights. Make sure that you have a light by your bed that is easy to reach. Do not use any sheets or blankets that are too  big for your bed. They should not hang down onto the floor. Have a firm chair that has side arms. You can use this for support while you get dressed. Do not have throw rugs and other things on the floor that can make you trip. What can I do in the kitchen? Clean up any spills right away. Avoid walking on wet floors. Keep items that you use a lot in easy-to-reach places. If you need to reach something above you, use a strong step stool that has a grab bar. Keep electrical cords out of the way. Do not use floor polish or wax that makes floors slippery. If you must use wax, use non-skid floor wax. Do not have throw rugs and other things on the floor that can make you trip. What can I do with my stairs? Do not leave any items on the stairs. Make sure that there are handrails on both sides of the stairs and use them. Fix handrails that are  broken or loose. Make sure that handrails are as long as the stairways. Check any carpeting to make sure that it is firmly attached to the stairs. Fix any carpet that is loose or worn. Avoid having throw rugs at the top or bottom of the stairs. If you do have throw rugs, attach them to the floor with carpet tape. Make sure that you have a light switch at the top of the stairs and the bottom of the stairs. If you do not have them, ask someone to add them for you. What else can I do to help prevent falls? Wear shoes that: Do not have high heels. Have rubber bottoms. Are comfortable and fit you well. Are closed at the toe. Do not wear sandals. If you use a stepladder: Make sure that it is fully opened. Do not climb a closed stepladder. Make sure that both sides of the stepladder are locked into place. Ask someone to hold it for you, if possible. Clearly mark and make sure that you can see: Any grab bars or handrails. First and last steps. Where the edge of each step is. Use tools that help you move around (mobility aids) if they are needed. These include: Canes. Walkers. Scooters. Crutches. Turn on the lights when you go into a dark area. Replace any light bulbs as soon as they burn out. Set up your furniture so you have a clear path. Avoid moving your furniture around. If any of your floors are uneven, fix them. If there are any pets around you, be aware of where they are. Review your medicines with your doctor. Some medicines can make you feel dizzy. This can increase your chance of falling. Ask your doctor what other things that you can do to help prevent falls. This information is not intended to replace advice given to you by your health care provider. Make sure you discuss any questions you have with your health care provider. Document Released: 04/14/2009 Document Revised: 11/24/2015 Document Reviewed: 07/23/2014 Elsevier Interactive Patient Education  2017 [employer].

## 2022-12-21 ENCOUNTER — Ambulatory Visit: Payer: Medicare Other | Admitting: Nurse Practitioner

## 2022-12-26 ENCOUNTER — Encounter (HOSPITAL_COMMUNITY)
Admission: RE | Admit: 2022-12-26 | Discharge: 2022-12-26 | Disposition: A | Discharge status: Home / Self Care | Payer: Medicare Other | Source: Ambulatory Visit | Attending: Nephrology | Admitting: Nephrology

## 2022-12-26 VITALS — BP 144/67 | HR 76 | Temp 97.7°F | Resp 16

## 2022-12-26 DIAGNOSIS — N1832 Chronic kidney disease, stage 3b: Secondary | ICD-10-CM | POA: Insufficient documentation

## 2022-12-26 DIAGNOSIS — D631 Anemia in chronic kidney disease: Secondary | ICD-10-CM | POA: Diagnosis not present

## 2022-12-26 MED ORDER — EPOETIN ALFA-EPBX 10000 UNIT/ML IJ SOLN
6000.0000 [IU] | Freq: Once | INTRAMUSCULAR | Status: AC
  Administered 2022-12-26: 6000 [IU] via SUBCUTANEOUS

## 2022-12-26 NOTE — Addendum Note (Signed)
Encounter addended by: Arrie Senate, RN on: 12/26/2022 1:29 PM  Actions taken: Therapy plan modified

## 2022-12-26 NOTE — Progress Notes (Signed)
Diagnosis: Anemia in Chronic Kidney Disease  Provider:  Manpreet Bhutani MD  Procedure: Injection  Retacrit (epoetin alfa-epbx), Dose: 6000 units , Site: subcutaneous, Number of injections: 1  Hgb 8.4. Administered in right arm.  Post Care: Patient declined observation  Discharge: Condition: Good, Destination: Home . AVS Provided  Performed by:  Wyvonne Lenz, RN

## 2022-12-27 LAB — POCT HEMOGLOBIN-HEMACUE: Hemoglobin: 8.4 g/dL — ABNORMAL LOW (ref 12.0–15.0)

## 2022-12-28 ENCOUNTER — Inpatient Hospital Stay: Payer: Medicare Other

## 2022-12-28 VITALS — BP 121/59 | HR 60 | Temp 97.7°F | Resp 18

## 2022-12-28 DIAGNOSIS — C7A010 Malignant carcinoid tumor of the duodenum: Secondary | ICD-10-CM | POA: Diagnosis not present

## 2022-12-28 DIAGNOSIS — D472 Monoclonal gammopathy: Secondary | ICD-10-CM | POA: Diagnosis not present

## 2022-12-28 DIAGNOSIS — I2510 Atherosclerotic heart disease of native coronary artery without angina pectoris: Secondary | ICD-10-CM | POA: Diagnosis not present

## 2022-12-28 DIAGNOSIS — D500 Iron deficiency anemia secondary to blood loss (chronic): Secondary | ICD-10-CM

## 2022-12-28 DIAGNOSIS — Z822 Family history of deafness and hearing loss: Secondary | ICD-10-CM | POA: Diagnosis not present

## 2022-12-28 DIAGNOSIS — Z833 Family history of diabetes mellitus: Secondary | ICD-10-CM | POA: Diagnosis not present

## 2022-12-28 DIAGNOSIS — N1832 Chronic kidney disease, stage 3b: Secondary | ICD-10-CM | POA: Diagnosis not present

## 2022-12-28 DIAGNOSIS — Z8249 Family history of ischemic heart disease and other diseases of the circulatory system: Secondary | ICD-10-CM | POA: Diagnosis not present

## 2022-12-28 DIAGNOSIS — R002 Palpitations: Secondary | ICD-10-CM | POA: Diagnosis not present

## 2022-12-28 DIAGNOSIS — Z79899 Other long term (current) drug therapy: Secondary | ICD-10-CM | POA: Diagnosis not present

## 2022-12-28 DIAGNOSIS — Z841 Family history of disorders of kidney and ureter: Secondary | ICD-10-CM | POA: Diagnosis not present

## 2022-12-28 DIAGNOSIS — Z8379 Family history of other diseases of the digestive system: Secondary | ICD-10-CM | POA: Diagnosis not present

## 2022-12-28 DIAGNOSIS — I4891 Unspecified atrial fibrillation: Secondary | ICD-10-CM | POA: Diagnosis not present

## 2022-12-28 DIAGNOSIS — I129 Hypertensive chronic kidney disease with stage 1 through stage 4 chronic kidney disease, or unspecified chronic kidney disease: Secondary | ICD-10-CM | POA: Diagnosis not present

## 2022-12-28 DIAGNOSIS — E785 Hyperlipidemia, unspecified: Secondary | ICD-10-CM | POA: Diagnosis not present

## 2022-12-28 DIAGNOSIS — Z87891 Personal history of nicotine dependence: Secondary | ICD-10-CM | POA: Diagnosis not present

## 2022-12-28 DIAGNOSIS — D631 Anemia in chronic kidney disease: Secondary | ICD-10-CM

## 2022-12-28 DIAGNOSIS — Z825 Family history of asthma and other chronic lower respiratory diseases: Secondary | ICD-10-CM | POA: Diagnosis not present

## 2022-12-28 DIAGNOSIS — R200 Anesthesia of skin: Secondary | ICD-10-CM | POA: Diagnosis not present

## 2022-12-28 DIAGNOSIS — E039 Hypothyroidism, unspecified: Secondary | ICD-10-CM | POA: Diagnosis not present

## 2022-12-28 DIAGNOSIS — Z800 Family history of malignant neoplasm of digestive organs: Secondary | ICD-10-CM | POA: Diagnosis not present

## 2022-12-28 DIAGNOSIS — K922 Gastrointestinal hemorrhage, unspecified: Secondary | ICD-10-CM | POA: Diagnosis not present

## 2022-12-28 DIAGNOSIS — Z7901 Long term (current) use of anticoagulants: Secondary | ICD-10-CM | POA: Diagnosis not present

## 2022-12-28 MED ORDER — SODIUM CHLORIDE 0.9 % IV SOLN
510.0000 mg | Freq: Once | INTRAVENOUS | Status: AC
  Administered 2022-12-28: 510 mg via INTRAVENOUS
  Filled 2022-12-28: qty 510

## 2022-12-28 MED ORDER — CETIRIZINE HCL 10 MG PO TABS: 10.0000 mg | ORAL_TABLET | Freq: Once | ORAL | Status: DC

## 2022-12-28 MED ORDER — ACETAMINOPHEN 325 MG PO TABS
650.0000 mg | ORAL_TABLET | Freq: Once | ORAL | Status: AC
  Administered 2022-12-28: 650 mg via ORAL
  Filled 2022-12-28: qty 2

## 2022-12-28 MED ORDER — SODIUM CHLORIDE 0.9 % IV SOLN: Freq: Once | INTRAVENOUS | Status: AC

## 2022-12-28 NOTE — Progress Notes (Signed)
Patient presents today for Feraheme infusion per providers order.  Vital signs WNL.  Patient took zyrtec at home prior to appointment.  No new complaints at this time.  Peripheral IV started and blood return noted pre and post infusion.  Treatment given today per MD orders.  Stable during infusion without adverse affects.  Vital signs stable.  No complaints at this time.  Discharge from clinic ambulatory in stable condition.  Alert and oriented X 3.  Follow up with [facility] as scheduled.

## 2022-12-28 NOTE — Patient Instructions (Signed)
[facility]  Discharge Instructions: Thank you for choosing [facility] to provide your oncology and hematology care.  If you have a lab appointment with the Cancer Center - please note that after April 8th, 2024, all labs will be drawn in the cancer center.  You do not have to check in or register with the main entrance as you have in the past but will complete your check-in in the cancer center.  Wear comfortable clothing and clothing appropriate for easy access to any Portacath or PICC line.   We strive to give you quality time with your provider. You may need to reschedule your appointment if you arrive late (15 or more minutes).  Arriving late affects you and other patients whose appointments are after yours.  Also, if you miss three or more appointments without notifying the office, you may be dismissed from the clinic at the provider's discretion.      For prescription refill requests, have your pharmacy contact our office and allow 72 hours for refills to be completed.    Today you received the following chemotherapy and/or immunotherapy agents Feraheme      To help prevent nausea and vomiting after your treatment, we encourage you to take your nausea medication as directed.  BELOW ARE SYMPTOMS THAT SHOULD BE REPORTED IMMEDIATELY: *FEVER GREATER THAN 100.4 F (38 C) OR HIGHER *CHILLS OR SWEATING *NAUSEA AND VOMITING THAT IS NOT CONTROLLED WITH YOUR NAUSEA MEDICATION *UNUSUAL SHORTNESS OF BREATH *UNUSUAL BRUISING OR BLEEDING *URINARY PROBLEMS (pain or burning when urinating, or frequent urination) *BOWEL PROBLEMS (unusual diarrhea, constipation, pain near the anus) TENDERNESS IN MOUTH AND THROAT WITH OR WITHOUT PRESENCE OF ULCERS (sore throat, sores in mouth, or a toothache) UNUSUAL RASH, SWELLING OR PAIN  UNUSUAL VAGINAL DISCHARGE OR ITCHING   Items with * indicate a potential emergency and should be followed up as soon as possible or go to the  Emergency Department if any problems should occur.  Please show the CHEMOTHERAPY ALERT CARD or IMMUNOTHERAPY ALERT CARD at check-in to the Emergency Department and triage nurse.  Should you have questions after your visit or need to cancel or reschedule your appointment, please contact [facility] [phone]  and follow the prompts.  Office hours are 8:00 a.m. to 4:30 p.m. Monday - Friday. Please note that voicemails left after 4:00 p.m. may not be returned until the following business day.  We are closed weekends and major holidays. You have access to a nurse at all times for urgent questions. Please call the main number to the clinic [phone] and follow the prompts.  For any non-urgent questions, you may also contact your provider using MyChart. We now offer e-Visits for anyone 18 and older to request care online for non-urgent symptoms. For details visit [email or web address].   Also download the MyChart app! Go to the app store, search "MyChart", open the app, select [facility], and log in with your MyChart username and password.   

## 2023-01-02 ENCOUNTER — Ambulatory Visit (HOSPITAL_COMMUNITY): Payer: Medicare Other

## 2023-01-07 DIAGNOSIS — R809 Proteinuria, unspecified: Secondary | ICD-10-CM | POA: Diagnosis not present

## 2023-01-07 DIAGNOSIS — D3A8 Other benign neuroendocrine tumors: Secondary | ICD-10-CM | POA: Diagnosis not present

## 2023-01-07 DIAGNOSIS — N1832 Chronic kidney disease, stage 3b: Secondary | ICD-10-CM | POA: Diagnosis not present

## 2023-01-07 DIAGNOSIS — I5032 Chronic diastolic (congestive) heart failure: Secondary | ICD-10-CM | POA: Diagnosis not present

## 2023-01-09 ENCOUNTER — Ambulatory Visit (HOSPITAL_COMMUNITY): Admission: RE | Admit: 2023-01-09 | Payer: Medicare Other | Source: Ambulatory Visit

## 2023-01-09 ENCOUNTER — Emergency Department (HOSPITAL_COMMUNITY)
Admission: EM | Admit: 2023-01-09 | Discharge: 2023-01-09 | Disposition: A | Discharge status: Home / Self Care | Payer: Medicare Other | Attending: Student | Admitting: Student

## 2023-01-09 ENCOUNTER — Other Ambulatory Visit: Payer: Self-pay

## 2023-01-09 ENCOUNTER — Encounter (HOSPITAL_COMMUNITY)
Admission: RE | Admit: 2023-01-09 | Discharge: 2023-01-09 | Disposition: A | Discharge status: Home / Self Care | Payer: Medicare Other | Source: Ambulatory Visit | Attending: Nephrology | Admitting: Nephrology

## 2023-01-09 ENCOUNTER — Encounter (HOSPITAL_COMMUNITY): Payer: Self-pay | Admitting: Emergency Medicine

## 2023-01-09 VITALS — BP 122/61 | HR 60 | Temp 97.7°F

## 2023-01-09 DIAGNOSIS — I10 Essential (primary) hypertension: Secondary | ICD-10-CM | POA: Insufficient documentation

## 2023-01-09 DIAGNOSIS — I2510 Atherosclerotic heart disease of native coronary artery without angina pectoris: Secondary | ICD-10-CM | POA: Insufficient documentation

## 2023-01-09 DIAGNOSIS — N183 Chronic kidney disease, stage 3 unspecified: Secondary | ICD-10-CM | POA: Insufficient documentation

## 2023-01-09 DIAGNOSIS — D631 Anemia in chronic kidney disease: Secondary | ICD-10-CM | POA: Diagnosis not present

## 2023-01-09 DIAGNOSIS — E785 Hyperlipidemia, unspecified: Secondary | ICD-10-CM | POA: Insufficient documentation

## 2023-01-09 DIAGNOSIS — Z955 Presence of coronary angioplasty implant and graft: Secondary | ICD-10-CM | POA: Diagnosis not present

## 2023-01-09 DIAGNOSIS — D649 Anemia, unspecified: Secondary | ICD-10-CM | POA: Insufficient documentation

## 2023-01-09 DIAGNOSIS — R5383 Other fatigue: Secondary | ICD-10-CM | POA: Insufficient documentation

## 2023-01-09 DIAGNOSIS — N1832 Chronic kidney disease, stage 3b: Secondary | ICD-10-CM | POA: Insufficient documentation

## 2023-01-09 DIAGNOSIS — I129 Hypertensive chronic kidney disease with stage 1 through stage 4 chronic kidney disease, or unspecified chronic kidney disease: Secondary | ICD-10-CM | POA: Diagnosis not present

## 2023-01-09 DIAGNOSIS — N189 Chronic kidney disease, unspecified: Secondary | ICD-10-CM | POA: Diagnosis not present

## 2023-01-09 DIAGNOSIS — Z7901 Long term (current) use of anticoagulants: Secondary | ICD-10-CM | POA: Insufficient documentation

## 2023-01-09 DIAGNOSIS — I6523 Occlusion and stenosis of bilateral carotid arteries: Secondary | ICD-10-CM | POA: Insufficient documentation

## 2023-01-09 DIAGNOSIS — I4891 Unspecified atrial fibrillation: Secondary | ICD-10-CM | POA: Insufficient documentation

## 2023-01-09 LAB — PREPARE RBC (CROSSMATCH)

## 2023-01-09 LAB — CBC WITH DIFFERENTIAL/PLATELET
Abs Immature Granulocytes: 0.02 10*3/uL (ref 0.00–0.07)
Basophils Absolute: 0 10*3/uL (ref 0.0–0.1)
Basophils Relative: 1 %
Eosinophils Absolute: 0.3 10*3/uL (ref 0.0–0.5)
Eosinophils Relative: 6 %
HCT: 23.4 % — ABNORMAL LOW (ref 36.0–46.0)
Hemoglobin: 7.3 g/dL — ABNORMAL LOW (ref 12.0–15.0)
Immature Granulocytes: 0 %
Lymphocytes Relative: 19 %
Lymphs Abs: 1 10*3/uL (ref 0.7–4.0)
MCH: 31.9 pg (ref 26.0–34.0)
MCHC: 31.2 g/dL (ref 30.0–36.0)
MCV: 102.2 fL — ABNORMAL HIGH (ref 80.0–100.0)
Monocytes Absolute: 0.4 10*3/uL (ref 0.1–1.0)
Monocytes Relative: 8 %
Neutro Abs: 3.6 10*3/uL (ref 1.7–7.7)
Neutrophils Relative %: 66 %
Platelets: 180 10*3/uL (ref 150–400)
RBC: 2.29 MIL/uL — ABNORMAL LOW (ref 3.87–5.11)
RDW: 15 % (ref 11.5–15.5)
WBC: 5.4 10*3/uL (ref 4.0–10.5)
nRBC: 0 % (ref 0.0–0.2)

## 2023-01-09 LAB — TYPE AND SCREEN

## 2023-01-09 LAB — PROTIME-INR
INR: 1.8 — ABNORMAL HIGH (ref 0.8–1.2)
Prothrombin Time: 20.8 seconds — ABNORMAL HIGH (ref 11.4–15.2)

## 2023-01-09 LAB — BASIC METABOLIC PANEL
Anion gap: 9 (ref 5–15)
BUN: 36 mg/dL — ABNORMAL HIGH (ref 8–23)
CO2: 19 mmol/L — ABNORMAL LOW (ref 22–32)
Calcium: 10.3 mg/dL (ref 8.9–10.3)
Chloride: 109 mmol/L (ref 98–111)
Creatinine, Ser: 1.64 mg/dL — ABNORMAL HIGH (ref 0.44–1.00)
GFR, Estimated: 31 mL/min — ABNORMAL LOW (ref >60)
Glucose, Bld: 100 mg/dL — ABNORMAL HIGH (ref 70–99)
Potassium: 4.5 mmol/L (ref 3.5–5.1)
Sodium: 137 mmol/L (ref 135–145)

## 2023-01-09 LAB — BPAM RBC: Unit Type and Rh: 6200

## 2023-01-09 LAB — HEMOGLOBIN AND HEMATOCRIT, BLOOD
HCT: 26.8 % — ABNORMAL LOW (ref 36.0–46.0)
Hemoglobin: 8.5 g/dL — ABNORMAL LOW (ref 12.0–15.0)

## 2023-01-09 MED ORDER — EPOETIN ALFA-EPBX 10000 UNIT/ML IJ SOLN
6000.0000 [IU] | Freq: Once | INTRAMUSCULAR | Status: AC
  Administered 2023-01-09: 6000 [IU] via SUBCUTANEOUS
  Filled 2023-01-09: qty 1

## 2023-01-09 MED ORDER — SODIUM CHLORIDE 0.9% IV SOLUTION: Freq: Once | INTRAVENOUS | Status: AC

## 2023-01-09 NOTE — ED Provider Notes (Signed)
[place] EMERGENCY DEPARTMENT AT [facility] Provider Note   CSN: [id number] Arrival date & time: 01/09/23  1338     History  Chief Complaint  Patient presents with   Abnormal Lab    [name] [name] is a 82 y.o. 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  She has had an anemia workup at the heme-onc clinic and per patient's report her workup was negative, it is presumed her anemia secondary to chronic kidney disease.  She has had 2 Feraheme infusions and was at the infusion center today for Retacrit injection.  During that visit her hemoglobin was noted to be 6.7.  Dr. Wolfgang Phoenix has requested she come to the ED for blood transfusion.  She denies chest pain, shortness of breath, dizziness, generalized weakness.  She has had no blood in her stools, she states that she is on iron supplementation so her stools are dark however.  She did see a tinge of blood in her toothpaste this morning when brushing, otherwise no unexplained bleeding or bruising.   The history is provided by the patient.       Home Medications Prior to Admission medications   Medication Sig Start Date End Date Taking? Authorizing Provider  amLODipine (NORVASC) 5 MG tablet Take 1 tablet (5 mg total) by mouth daily. TAKE ONE TABLET BY MOUTH TWICE DAILY FOR BLOOD PRESSURE 12/19/22   Kerri Perches, MD  cetirizine (ZYRTEC) 10 MG tablet Take 1 tablet (10 mg total) by mouth daily. 11/20/22   Kerri Perches, MD  famotidine (PEPCID) 20 MG tablet Take 1 tablet (20 mg total) by mouth 2 (two) times daily. 12/12/22 12/12/23  Kerri Perches, MD  ferrous sulfate 325 (65 FE) MG tablet Take 325 mg by mouth daily with breakfast.    [provider]  fish oil-omega-3 fatty acids 1000 MG capsule Take 1 g by mouth daily.    [provider]  fluticasone (FLONASE) 50 MCG/ACT  nasal spray USE 2 SPRAYS IN EACH NOSTRIL ONCE DAILY. SHAKE GENTLY BEFORE USING. 01/10/21   Kerri Perches, MD  hydrALAZINE (APRESOLINE) 100 MG tablet Take one tablet by mouth once daily 11/20/22   Kerri Perches, MD  latanoprost (XALATAN) 0.005 % ophthalmic solution Place 1 drop into both eyes at bedtime.    [provider]  levothyroxine (SYNTHROID) 100 MCG tablet Take 1 tablet (100 mcg total) by mouth daily. 11/20/22   Kerri Perches, MD  metoprolol succinate (TOPROL-XL) 50 MG 24 hr tablet Take 1 tablet (50 mg total) by mouth daily. Take with or immediately following a meal. 12/06/22   Kerri Perches, MD  nitroGLYCERIN (NITROSTAT) 0.4 MG SL tablet Place 1 tablet (0.4 mg total) under the tongue every 5 (five) minutes x 3 doses as needed for chest pain (if no relief after 3rd dose, proceed to ED or call 911). 11/21/22   Jonelle Sidle, MD  Polyvinyl Alcohol-Povidone (REFRESH OP) Place 1 drop into both eyes at bedtime.    [provider]  Rivaroxaban (XARELTO) 15 MG TABS tablet Take 1 tablet (15 mg total) by mouth daily with supper. 12/06/22   Kerri Perches, MD  rosuvastatin (CRESTOR) 10 MG tablet Take 1 tablet (10 mg total) by mouth daily. 11/20/22   Kerri Perches, MD  SYSTANE ULTRA 0.4-0.3 % SOLN Place 1 drop into both eyes daily as  needed (dry eyes). 05/22/21   [provider]  telmisartan (MICARDIS) 20 MG tablet Take 0.5 tablets (10 mg total) by mouth daily. 12/06/22 12/06/23  Kerri Perches, MD  trolamine salicylate (ASPERCREME) 10 % cream Apply 1 application topically as needed for muscle pain.    [provider]  UNABLE TO FIND Shower chair x 1  DX M54.36 05/11/20   Kerri Perches, MD  UNABLE TO FIND Tdap x 1 fax vaccine record to [phone] 05/15/22   Kerri Perches, MD      Allergies    Tramadol, Lisinopril, Shrimp (diagnostic), and Clonidine derivatives    Review of Systems   Review of Systems  Constitutional:   Negative for chills and fever.  HENT:  Negative for congestion and sore throat.   Eyes: Negative.   Respiratory:  Negative for chest tightness and shortness of breath.   Cardiovascular:  Negative for chest pain.  Gastrointestinal:  Negative for abdominal pain and nausea.  Genitourinary: Negative.   Musculoskeletal:  Negative for arthralgias, joint swelling and neck pain.  Skin: Negative.  Negative for rash and wound.  Neurological:  Negative for dizziness, weakness, light-headedness, numbness and headaches.  Psychiatric/Behavioral: Negative.      Physical Exam Updated Vital Signs BP (!) 143/64   Pulse (!) 59   Temp (!) 97.5 F (36.4 C) (Oral)   Resp 15   Ht 5\' 1"  (1.549 m)   Wt 68 kg   SpO2 96%   BMI 28.34 kg/m  Physical Exam Vitals and nursing note reviewed.  Constitutional:      Appearance: She is well-developed.  HENT:     Head: Normocephalic and atraumatic.  Eyes:     Comments: Conjunctiva pale  Cardiovascular:     Rate and Rhythm: Normal rate. Rhythm irregular.     Heart sounds: Normal heart sounds.  Pulmonary:     Effort: Pulmonary effort is normal.     Breath sounds: Normal breath sounds. No wheezing.  Abdominal:     General: Bowel sounds are normal.     Palpations: Abdomen is soft.     Tenderness: There is no abdominal tenderness.  Musculoskeletal:        General: Normal range of motion.     Cervical back: Normal range of motion.  Skin:    General: Skin is warm and dry.  Neurological:     Mental Status: She is alert.     ED Results / Procedures / Treatments   Labs (all labs ordered are listed, but only abnormal results are displayed) Labs Reviewed  CBC WITH DIFFERENTIAL/PLATELET - Abnormal; Notable for the following components:      Result Value   RBC 2.29 (*)    Hemoglobin 7.3 (*)    HCT 23.4 (*)    MCV 102.2 (*)    All other components within normal limits  BASIC METABOLIC PANEL - Abnormal; Notable for the following components:   CO2 19 (*)     Glucose, Bld 100 (*)    BUN 36 (*)    Creatinine, Ser 1.64 (*)    GFR, Estimated 31 (*)    All other components within normal limits  PROTIME-INR - Abnormal; Notable for the following components:   Prothrombin Time 20.8 (*)    INR 1.8 (*)    All other components within normal limits  TYPE AND SCREEN  PREPARE RBC (CROSSMATCH)    EKG None  Radiology No results found.  Procedures Procedures    Medications Ordered  in ED Medications  0.9 %  sodium chloride infusion (Manually program via Guardrails IV Fluids) (0 mLs Intravenous Stopped 01/09/23 1704)    ED Course/ Medical Decision Making/ A&P                             Medical Decision Making Patient with acute on chronic anemia of chronic disease, specifically chronic kidney disease presenting for blood transfusion secondary to hemoglobin of 6.7 per the infusion center this morning where she received a Retacrit injection.  Given she has a cardiac history, hemoglobin above 8 would be recommended.  She denies any symptoms of shortness of breath, chest pain or weakness.  Discussed with Carmel Sacramento PA-C who will dispo pt once transfusion complete  Amount and/or Complexity of Data Reviewed Labs: ordered.    Details: Repeat labs here give [place] a hemoglobin of 7.3, her creatinine is 1.64, over the past 2 months her creatinines have been 1.35-1.51 on average.  Risk Drug therapy requiring intensive monitoring for toxicity. Decision regarding hospitalization. Risk Details: Indication for hospitalization today.  Patient will receive 1 unit of packed RBCs here after which she will be disposed home.  She has close follow-up with her providers, she sees cardiology in 12 days and endorses she has repeat hemoglobins every 2 weeks at the the heme-onc clinic.   CRITICAL CARE Performed by: Burgess Amor Total critical care time: 35 minutes Critical care time was exclusive of separately billable procedures and treating other  patients. Critical care was necessary to treat or prevent imminent or life-threatening deterioration. Critical care was time spent personally by me on the following activities: development of treatment plan with patient and/or surrogate as well as nursing, discussions with consultants, evaluation of patient's response to treatment, examination of patient, obtaining history from patient or surrogate, ordering and performing treatments and interventions, ordering and review of laboratory studies, ordering and review of radiographic studies, pulse oximetry and re-evaluation of patient's condition.         Final Clinical Impression(s) / ED Diagnoses Final diagnoses:  Anemia due to chronic kidney disease, unspecified CKD stage    Rx / DC Orders ED Discharge Orders     None         Victoriano Lain 01/09/23 Clovia Cuff, MD 01/10/23 [phone]

## 2023-01-09 NOTE — ED Triage Notes (Signed)
Pt sent from infusion center where she was planning to have a mammogram performed; lab work showed HgB 7 so she was brought to ER for possible blood transfusion. Pt denies physical complaints but notes she did have some bleeding of the gums when brushing her teeth this morning.

## 2023-01-09 NOTE — ED Provider Notes (Signed)
  Physical Exam  BP (!) 143/64   Pulse (!) 59   Temp (!) 97.5 F (36.4 C) (Oral)   Resp 15   Ht 5\' 1"  (1.549 m)   Wt 68 kg   SpO2 96%   BMI 28.34 kg/m   Physical Exam  Procedures  Procedures  ED Course / MDM    Medical Decision Making Amount and/or Complexity of Data Reviewed Labs: ordered.  Risk Prescription drug management.  This patient's care was assumed by me at shift change.  Denies getting transfused 1 unit of blood due to her chronic anemia from her CKD.  Plan on discharge after recheck of hemoglobin  Patient's repeat hemoglobin after transfusion was 8.5.  Discharged for outpatient follow-up and return precautions.       Josem Kaufmann 01/09/23 2334    Vanetta Mulders, MD 01/10/23 [phone]

## 2023-01-09 NOTE — Discharge Instructions (Addendum)
You are seen today for a blood transfusion due to your anemia.  You got 1 unit of blood and your repeat hemoglobin was back to 8.5.  Follow-up closely with your doctor, come back to the ER if you have new or worsening symptoms.

## 2023-01-09 NOTE — Progress Notes (Signed)
Diagnosis: Anemia in Chronic Kidney Disease  Provider:  Manpreet Bhutani MD  Procedure: Injection  Retacrit (epoetin alfa-epbx), Dose: 6000 units , Site: subcutaneous, Number of injections: 1  Hgb 6.7  Post Care: Patient declined observation  Discharge: Condition: Good, Destination: Home . AVS Provided  Performed by:  Arrie Senate, RN   Spoke with Dr Rudean Curt office and he would like her to go to the ER for blood transfusion.  Pt is asymptomatic.  Cancer center aware also.  Triage nurse in the ER called and notified of pt coming.

## 2023-01-10 LAB — TYPE AND SCREEN
ABO/RH(D): A POS
Antibody Screen: NEGATIVE
Unit division: 0

## 2023-01-10 LAB — BPAM RBC
Blood Product Expiration Date: 202408092359
ISSUE DATE / TIME: 202407101637

## 2023-01-10 LAB — POCT HEMOGLOBIN-HEMACUE: Hemoglobin: 6.7 g/dL — CL (ref 12.0–15.0)

## 2023-01-14 ENCOUNTER — Telehealth: Payer: Self-pay | Admitting: Family Medicine

## 2023-01-14 NOTE — Telephone Encounter (Signed)
Patient called asking does the provider think the medication  [name] is causing her to have so many blood transfusions? Call back # [phone].

## 2023-01-15 ENCOUNTER — Encounter: Payer: Self-pay | Admitting: Family Medicine

## 2023-01-15 ENCOUNTER — Ambulatory Visit (INDEPENDENT_AMBULATORY_CARE_PROVIDER_SITE_OTHER): Payer: Medicare Other | Admitting: Family Medicine

## 2023-01-15 ENCOUNTER — Telehealth: Payer: Self-pay

## 2023-01-15 VITALS — BP 101/56 | HR 88 | Ht 61.0 in | Wt 150.0 lb

## 2023-01-15 DIAGNOSIS — E669 Obesity, unspecified: Secondary | ICD-10-CM

## 2023-01-15 DIAGNOSIS — E559 Vitamin D deficiency, unspecified: Secondary | ICD-10-CM | POA: Diagnosis not present

## 2023-01-15 DIAGNOSIS — E038 Other specified hypothyroidism: Secondary | ICD-10-CM | POA: Diagnosis not present

## 2023-01-15 DIAGNOSIS — R58 Hemorrhage, not elsewhere classified: Secondary | ICD-10-CM | POA: Diagnosis not present

## 2023-01-15 DIAGNOSIS — I10 Essential (primary) hypertension: Secondary | ICD-10-CM

## 2023-01-15 DIAGNOSIS — Z09 Encounter for follow-up examination after completed treatment for conditions other than malignant neoplasm: Secondary | ICD-10-CM

## 2023-01-15 DIAGNOSIS — E785 Hyperlipidemia, unspecified: Secondary | ICD-10-CM | POA: Diagnosis not present

## 2023-01-15 NOTE — Telephone Encounter (Signed)
Transition Care Management Follow-up Telephone Call Date of discharge and from where: 7/10/[address]24 [facility] How have you been since you were released from the hospital? Patient is feeling better Any questions or concerns? No  Items Reviewed: Did the pt receive and understand the discharge instructions provided? Yes  Medications obtained and verified?  No medications were prescribed Other? No  Any new allergies since your discharge? No  Dietary orders reviewed? Yes Do you have support at home? Yes   Follow up appointments reviewed:  PCP Hospital f/u appt confirmed?  Patient left message for PCP 01/14/2023 regarding blood transfusions and medication.  Scheduled to see  on  @ . [facility] f/u appt confirmed? Yes  Scheduled to see Rojelio Brenner, PA-C on 01/17/2023 @ [facility]. Are transportation arrangements needed? No  If their condition worsens, is the pt aware to call PCP or go to the Emergency Dept.? Yes Was the patient provided with contact information for the PCP's office or ED? Yes Was to pt encouraged to call back with questions or concerns? Yes  [name]  [facility] Population Health Community Resource Care Guide   [email or web address]  ?? [phone]   Website: [email or web address]  [email or web address]

## 2023-01-15 NOTE — Patient Instructions (Addendum)
F/u in early October, re evaluate chronic problems , call if you need me sooner  Lipid, cmp and eGFr, CBC, TSH and hBA1c today  I will be in touch with Dr Diona Browner regarding concerns voiced  Thanks for choosing [facility], we consider it a privelige to serve you.

## 2023-01-15 NOTE — Telephone Encounter (Signed)
Scheduled 07.16.2024

## 2023-01-15 NOTE — Telephone Encounter (Signed)
Needs sooner appt to address her concerns per simpson

## 2023-01-16 ENCOUNTER — Encounter: Payer: Self-pay | Admitting: Family Medicine

## 2023-01-16 ENCOUNTER — Other Ambulatory Visit: Payer: Self-pay

## 2023-01-16 DIAGNOSIS — N1832 Chronic kidney disease, stage 3b: Secondary | ICD-10-CM

## 2023-01-16 NOTE — Progress Notes (Deleted)
[facility] 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 female returns for routine follow-up of her anemia, duodenal carcinoid tumor, and MGUS.  She was last seen by Dr. Ellin Saba on 12/18/2022.  In the interim since her last visit, she had an ED visit 01/09/2023 due to outpatient labs showing Hgb 6.7 (when checked by outpatient infusion clinic), therefore received 1 unit PRBC then discharged home.  At today's visit, she reports feeling ***. *** She denies chest pain, shortness of breath, dizziness, generalized weakness. She has had no blood in her stools, however she states that she is on iron supplementation so her stools are dark.  She has ***% energy and ***% appetite. She endorses that she is maintaining a stable weight.   ASSESSMENT & PLAN:  1.  Normocytic anemia: - More than 5-year history of iron deficiency anemia secondary to GI bleed.  She underwent multiple EGDs and colonoscopies and 2 small bowel capsule endoscopies. - She reports taking iron tablet daily for the past 3 years.  Denies any ice pica. - Denies any BRBPR/melena.  She had blood transfusion 2 weeks ago (***). ***ED visit 01/09/2023 due to outpatient labs showing Hgb 6.7 (when checked by outpatient infusion clinic), therefore received 1 unit PRBC - *** - Multifactorial anemia from CKD, iron deficiency and chronic blood loss. - Status post 1 unit PRBC on 12/12/2022 for hemoglobin 7.6. - Last ferritin was 18 on 11/16/2022. - Recommend Feraheme weekly x 2. - Continue Retacrit 6000 units every 4 weeks, ordered by Dr. [place] - PLAN: ***  2.   Duodenal  neuroendocrine tumor: - 04/27/2021 (duodenal mass biopsy): Well-differentiated carcinoid tumor, grade 1, Ki-67 1%. - Dotatate PET (05/2021): No focal activity within the duodenum or evidence of metastatic disease. - EGD/EUS (08/2021): Submucosal nodule found in the duodenum-consistent with previously biopsied duodenal NET.  An intramural subepithelial lesion found in the second part of the duodenum.  Lesion appears to originate from within the deep mucosa and submucosa.  She was not felt to be candidate for EMR. - Patient had traumatic experience after the EUS and did not want to pursue further options and declined a referral to [facility]. - Denies any diarrhea/flushing/wheezing/abdominal pain.  No obstructive symptoms. - *** - Chromogranin (11/16/2022): 847 - 24-hour urine 5-HIAA (11/21/2022): Normal - Reviewed dotatate PET scan from 12/06/2022: No evidence of neuroendocrine tumor or metastasis. - PLAN: *** PET scan every ***, chromogranin/5-HIAA every ***.  If any worsening, will consider Sandostatin/octreotide injections ***  ***.  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  Kappa light chains of 56, lambda light chains 36 and ratio of 1.55.  Immunofixation shows IgG kappa monoclonal protein.  Calcium is normal and her creatinine is at baseline.  We will closely monitor at this time. - *** - PLAN: ***   ***.   Social/family history: - Lives at home with her husband.  Independent of ADLs and IADLs.  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  No chemical exposure.  Smoked half pack per day starting  at age 64 and quit in 2005. - Brother had colon cancer.  Older brother had liver cancer. - *** - PLAN: ***    PLAN SUMMARY: >> *** >> *** >> ***   [facility] **VISIT SUMMARY & IMPORTANT INSTRUCTIONS **   You were seen today by Rojelio Brenner PA-C for your ***.     *** ***  *** ***  LABS: Return in ***   OTHER TESTS: ***  MEDICATIONS: ***  FOLLOW-UP APPOINTMENT: ***     REVIEW OF SYSTEMS: ***  Review of Systems - Oncology   PHYSICAL EXAM:  ECOG PERFORMANCE STATUS: {CHL ONC ECOG ZO:[id number]} *** There were no vitals filed for this visit. There were no vitals filed for this visit. Physical Exam  PAST MEDICAL/SURGICAL HISTORY:  Past Medical History:  Diagnosis Date   A-fib (HCC)    Blood transfusion without reported diagnosis    CAD (coronary artery disease)    Multivessel status post CABG October 2005   Carotid artery disease (HCC)    CKD (chronic kidney disease), stage III (HCC)    Essential hypertension    Glaucoma    Laser surgery 2010   Hyperlipidemia    Hypothyroidism    Iron deficiency anemia    Obesity    Past Surgical History:  Procedure Laterality Date   BIOPSY  04/27/2021   Procedure: BIOPSY;  Surgeon: Rachael Fee, MD;  Location: [facility];  Service: Endoscopy;;   COLONOSCOPY N/A 02/04/2013   Procedure: COLONOSCOPY;  Surgeon: Malissa Hippo, MD;  [facility]: [facility];  Service: Endoscopy;  Laterality: N/A;  1015   COLONOSCOPY N/A 12/21/2016   Procedure: COLONOSCOPY;  Surgeon: Malissa Hippo, MD;  [facility];  Service: Endoscopy;  Laterality: N/A;   COLONOSCOPY WITH PROPOFOL N/A 03/22/2021   Procedure: COLONOSCOPY WITH PROPOFOL;  Surgeon: Malissa Hippo, MD;  [facility];  Service: Endoscopy;  Laterality: N/A;  8:15   CORONARY ARTERY BYPASS GRAFT  October 2005   Dr. Cornelius Moras - LIMA to LAD, SVG to OM 2 and OM 3, SVG to PDA   ESOPHAGOGASTRODUODENOSCOPY N/A 12/21/2016   Procedure: ESOPHAGOGASTRODUODENOSCOPY (EGD);  Surgeon: Malissa Hippo, MD;  [facility];  Service: Endoscopy;  Laterality: N/A;  11:10   ESOPHAGOGASTRODUODENOSCOPY N/A 02/27/2018   Procedure: ESOPHAGOGASTRODUODENOSCOPY (EGD);  Surgeon: Malissa Hippo, MD;  [facility];  Service:  Endoscopy;  Laterality: N/A;   ESOPHAGOGASTRODUODENOSCOPY (EGD) WITH PROPOFOL N/A 04/27/2021   Procedure: ESOPHAGOGASTRODUODENOSCOPY (EGD) WITH PROPOFOL;  Surgeon: Rachael Fee, MD;  Location: [facility];  Service: Endoscopy;  Laterality: N/A;   ESOPHAGOGASTRODUODENOSCOPY (EGD) WITH PROPOFOL N/A 08/17/2021   Procedure: ESOPHAGOGASTRODUODENOSCOPY (EGD) WITH PROPOFOL;  Surgeon: Meridee Score Netty Starring., MD;  Location: [facility];  Service: Gastroenterology;  Laterality: N/A;   EUS N/A 04/27/2021   Procedure: UPPER ENDOSCOPIC ULTRASOUND (EUS) RADIAL;  Surgeon: Rachael Fee, MD;  Location: [facility];  Service: Endoscopy;  Laterality: N/A;   EYE SURGERY  2010   laser treatment to both eyes for glaucoma   GIVENS CAPSULE STUDY N/A 03/07/2018   Procedure: GIVENS CAPSULE STUDY;  Surgeon: Malissa Hippo, MD;  [facility];  Service: Endoscopy;  Laterality: N/A;   GIVENS CAPSULE STUDY N/A 02/05/2020   Procedure: GIVENS CAPSULE STUDY;  Surgeon: Malissa Hippo, MD;  [facility];  Service: Endoscopy;  Laterality: N/A;  730   POLYPECTOMY  03/22/2021   Procedure: POLYPECTOMY;  Surgeon: Malissa Hippo, MD;  [facility];  Service: Endoscopy;;   UPPER ESOPHAGEAL ENDOSCOPIC ULTRASOUND (EUS) N/A 08/17/2021   Procedure: UPPER ESOPHAGEAL ENDOSCOPIC ULTRASOUND (EUS);  Surgeon: Lemar Lofty., MD;  Location: [facility] ENDOSCOPY;  Service: Gastroenterology;  Laterality: N/A;    SOCIAL HISTORY:  Social History   Socioeconomic History   Marital status: Married    Spouse name: Not on file   Number of children: Not on file   Years of education: Not on file   Highest education level: Not on file  Occupational History   Occupation: retired   Tobacco Use   Smoking status: Former    Current packs/day: 0.00    Average packs/day: 0.3 packs/day for 40.0 years (10.0 ttl pk-yrs)    Types: Cigarettes    Start date: 07/03/1963    Quit date: 07/03/2003    Years since  quitting: 19.5    Passive exposure: Never   Smokeless tobacco: Never  Vaping Use   Vaping status: Never Used  Substance and Sexual Activity   Alcohol use: No    Alcohol/week: 0.0 standard drinks of alcohol   Drug use: No   Sexual activity: Never  Other Topics Concern   Not on file  Social History Narrative   Not on file   Social Determinants of Health   Financial Resource Strain: Low Risk  (12/19/2022)   Overall Financial Resource Strain (CARDIA)    Difficulty of Paying Living Expenses: Not hard at all  Food Insecurity: No Food Insecurity (12/19/2022)   Hunger Vital Sign    Worried About Running Out of Food in the Last Year: Never true    Ran Out of Food in the Last Year: Never true  Transportation Needs: No Transportation Needs (12/19/2022)   PRAPARE - Administrator, Civil Service (Medical): No    Lack of Transportation (Non-Medical): No  Physical Activity: Insufficiently Active (12/19/2022)   Exercise Vital Sign    Days of Exercise per Week: 5 days    Minutes of Exercise per Session: 10 min  Stress: No Stress Concern Present (12/19/2022)   [employer] of Occupational Health - Occupational Stress Questionnaire    Feeling of Stress : Not at all  Social Connections: Socially Integrated (12/19/2022)   Social Connection and Isolation Panel [NHANES]    Frequency of Communication with Friends and Family: More than three times a week    Frequency of Social Gatherings with Friends and Family: More than three times a week    Attends Religious Services: More than 4 times per year    Active Member of Clubs or Organizations: Yes    Attends Banker Meetings: More than 4 times per year    Marital Status: Married  Catering manager Violence: Not At Risk (12/19/2022)   Humiliation, Afraid, Rape, and Kick questionnaire    Fear of Current or Ex-Partner: No    Emotionally Abused: No    Physically Abused: No    Sexually Abused: No    FAMILY HISTORY:  Family  History  Problem Relation Age of Onset   Aneurysm Mother    Hypertension Mother    Coronary artery disease Mother    Coronary artery disease Father    Diabetes Sister    Coronary artery disease Sister    Diverticulosis Sister    COPD Sister    Kidney disease Sister    Diabetes Sister    Hypertension Sister    Breast cancer Sister 90  Hearing loss Sister    Heart Problems Sister        pacemaker   Colon cancer Brother 64       lived 18 months afterwards   Liver cancer Brother 55   Diabetes Son    Pancreatic cancer Son    Esophageal cancer Neg Hx    Inflammatory bowel disease Neg Hx    Stomach cancer Neg Hx     CURRENT MEDICATIONS:  Outpatient Encounter Medications as of 01/17/2023  Medication Sig Note   amLODipine (NORVASC) 5 MG tablet Take 1 tablet (5 mg total) by mouth daily. TAKE ONE TABLET BY MOUTH TWICE DAILY FOR BLOOD PRESSURE    cetirizine (ZYRTEC) 10 MG tablet Take 1 tablet (10 mg total) by mouth daily.    famotidine (PEPCID) 20 MG tablet Take 1 tablet (20 mg total) by mouth 2 (two) times daily.    ferrous sulfate 325 (65 FE) MG tablet Take 325 mg by mouth daily with breakfast.    fish oil-omega-3 fatty acids 1000 MG capsule Take 1 g by mouth daily.    fluticasone (FLONASE) 50 MCG/ACT nasal spray USE 2 SPRAYS IN EACH NOSTRIL ONCE DAILY. SHAKE GENTLY BEFORE USING. 01/15/2022: As needed   hydrALAZINE (APRESOLINE) 100 MG tablet Take one tablet by mouth once daily    latanoprost (XALATAN) 0.005 % ophthalmic solution Place 1 drop into both eyes at bedtime.    levothyroxine (SYNTHROID) 100 MCG tablet Take 1 tablet (100 mcg total) by mouth daily.    metoprolol succinate (TOPROL-XL) 50 MG 24 hr tablet Take 1 tablet (50 mg total) by mouth daily. Take with or immediately following a meal.    nitroGLYCERIN (NITROSTAT) 0.4 MG SL tablet Place 1 tablet (0.4 mg total) under the tongue every 5 (five) minutes x 3 doses as needed for chest pain (if no relief after 3rd dose, proceed to ED  or call 911).    Polyvinyl Alcohol-Povidone (REFRESH OP) Place 1 drop into both eyes at bedtime.    Rivaroxaban (XARELTO) 15 MG TABS tablet Take 1 tablet (15 mg total) by mouth daily with supper.    rosuvastatin (CRESTOR) 10 MG tablet Take 1 tablet (10 mg total) by mouth daily.    SYSTANE ULTRA 0.4-0.3 % SOLN Place 1 drop into both eyes daily as needed (dry eyes).    telmisartan (MICARDIS) 20 MG tablet Take 0.5 tablets (10 mg total) by mouth daily.    trolamine salicylate (ASPERCREME) 10 % cream Apply 1 application topically as needed for muscle pain.    UNABLE TO FIND Shower chair x 1  DX M54.36    UNABLE TO FIND Tdap x 1 fax vaccine record to [phone]    No facility-administered encounter medications on file as of 01/17/2023.    ALLERGIES:  Allergies  Allergen Reactions   Tramadol Itching   Lisinopril Cough   Shrimp (Diagnostic)     Gout flares   Clonidine Derivatives Other (See Comments)    Zombie BP dropped    LABORATORY DATA:  I have reviewed the labs as listed.  CBC    Component Value Date/Time   WBC 5.6 01/15/2023 1613   WBC 5.4 01/09/2023 1447   RBC 2.58 (LL) 01/15/2023 1613   RBC 2.29 (L) 01/09/2023 1447   HGB 8.2 (L) 01/15/2023 1613   HCT 25.5 (L) 01/15/2023 1613   PLT 196 01/15/2023 1613   MCV 99 (H) 01/15/2023 1613   MCH 31.8 01/15/2023 1613   MCH 31.9 01/09/2023 1447  MCHC 32.2 01/15/2023 1613   MCHC 31.2 01/09/2023 1447   RDW 13.2 01/15/2023 1613   LYMPHSABS 1.0 01/09/2023 1447   MONOABS 0.4 01/09/2023 1447   EOSABS 0.3 01/09/2023 1447   BASOSABS 0.0 01/09/2023 1447      Latest Ref Rng & Units 01/15/2023    4:13 PM 01/09/2023    2:47 PM 12/12/2022    2:00 PM  CMP  Glucose 70 - 99 mg/dL 161  096  99   BUN 8 - 27 mg/dL 31  36  25   Creatinine 0.57 - 1.00 mg/dL 0.45  4.09  8.11   Sodium 134 - 144 mmol/L 140  137  138   Potassium 3.5 - 5.2 mmol/L 5.1  4.5  4.4   Chloride 96 - 106 mmol/L 107  109  109   CO2 20 - 29 mmol/L 19  19  19    Calcium  8.7 - 10.3 mg/dL 9.9  91.4  78.2   Total Protein 6.0 - 8.5 g/dL 6.6   7.5   Total Bilirubin 0.0 - 1.2 mg/dL 0.2   0.4   Alkaline Phos 44 - 121 IU/L 84   73   AST 0 - 40 IU/L 18   21   ALT 0 - 32 IU/L 11   16     DIAGNOSTIC IMAGING:  I have independently reviewed the relevant imaging and discussed with the patient.   WRAP UP:  All questions were answered. The patient knows to call the clinic with any problems, questions or concerns.  Medical decision making: ***  Time spent on visit: I spent *** minutes counseling the patient face to face. The total time spent in the appointment was *** minutes and more than 50% was on counseling.  Carnella Guadalajara, PA-C  ***

## 2023-01-16 NOTE — Progress Notes (Signed)
   [name] [name]     MRN: [id number]      DOB: [birth year]  Chief Complaint  Patient presents with   Follow-up    Discuss medications/blood transfusions    HPI [name] [name] is here for follow up of most recent ED visit on 01/09/2023, when sher  was sent due to Hb of 6.7 for transfusion. She has been getting labs checked every 2 weeks by Nephrology, who treats her for anemia.and  She has required THREE transfusions since starting xarelto for new onset a fib in 08/2022, in April admitted and transfused in the Ed in June and again in July She is asymptomatic even when severely anemic and she  denies visible rectal blood or black stool Has real concerns as to whether the benefit of xarelto supercedes the risk   ROS: Denies recent fever or chills. Denies sinus pressure, nasal congestion, ear pain or sore throat. Denies chest congestion, productive cough or wheezing. Denies chest pains, palpitations and leg swelling Denies abdominal pain, nausea, vomiting,diarrhea or constipation.   Denies dysuria, frequency, hesitancy or incontinence. Denies joint pain, swelling and limitation in mobility. Denies headaches, seizures, numbness, or tingling. Denies depression, anxiety or insomnia. Denies skin break down or rash.   PE  BP (!) 101/56 (BP Location: Right Arm, Patient Position: Sitting, Cuff Size: Large)   Pulse 88   Ht 5\' 1"  (1.549 m)   Wt 150 lb 0.6 oz (68.1 kg)   SpO2 93%   BMI 28.35 kg/m   Patient alert and oriented and in no cardiopulmonary distress.  HEENT: No facial asymmetry, EOMI,     Neck supple .  Chest: Clear to auscultation bilaterally.  CVS: S1, S2 no murmurs, no S3.Irregularly irregular rate  ABD: Soft non tender.   Ext: No edema  MS: Adequate  though reduced ROM spine, shoulders, hips and knees.  Skin: Intact, no ulcerations or rash noted.  Psych: Good eye contact, normal affect. Memory intact not anxious or depressed appearing.  CNS: CN 2-12 intact, power,   normal throughout.no focal deficits noted.   Assessment & Plan  Recurrent bleeding Three episodes of asymptomatic anemia due to blood loss , in 4 months, currently on xarelto, Cardiology evaluation upcoming, pt requesting sooner appt with her Cardiology MD to discuss, will send message to both nP who has been treating her  and theDr [name] [name]   Hypothyroidism Controlled, no change in medication   Hyperlipidemia LDL goal <70 Hyperlipidemia:Low fat diet discussed and encouraged.   Lipid Panel  Lab Results  Component Value Date   CHOL 116 01/15/2023   HDL 51 01/15/2023   LDLCALC 44 01/15/2023   TRIG 117 01/15/2023   CHOLHDL 2.3 01/15/2023     Controlled, no change in medication   Encounter for examination following treatment at hospital Patient in for follow up of recent eD visit. Discharge summary, and laboratory and radiology data are reviewed, and any questions or concerns  are discussed. Specific issues requiring follow up are specifically addressed.   Essential hypertension, malignant Controlled, no change in medication

## 2023-01-17 ENCOUNTER — Inpatient Hospital Stay: Payer: Medicare Other | Attending: Hematology

## 2023-01-17 ENCOUNTER — Inpatient Hospital Stay: Payer: Medicare Other

## 2023-01-17 ENCOUNTER — Inpatient Hospital Stay: Payer: Medicare Other | Admitting: Physician Assistant

## 2023-01-17 DIAGNOSIS — Z79899 Other long term (current) drug therapy: Secondary | ICD-10-CM | POA: Insufficient documentation

## 2023-01-17 DIAGNOSIS — C7A010 Malignant carcinoid tumor of the duodenum: Secondary | ICD-10-CM | POA: Insufficient documentation

## 2023-01-17 DIAGNOSIS — Z09 Encounter for follow-up examination after completed treatment for conditions other than malignant neoplasm: Secondary | ICD-10-CM | POA: Insufficient documentation

## 2023-01-17 DIAGNOSIS — R58 Hemorrhage, not elsewhere classified: Secondary | ICD-10-CM | POA: Insufficient documentation

## 2023-01-17 LAB — CBC
Hematocrit: 25.5 % — ABNORMAL LOW (ref 34.0–46.6)
Hemoglobin: 8.2 g/dL — ABNORMAL LOW (ref 11.1–15.9)
MCH: 31.8 pg (ref 26.6–33.0)
MCHC: 32.2 g/dL (ref 31.5–35.7)
MCV: 99 fL — ABNORMAL HIGH (ref 79–97)
Platelets: 196 10*3/uL (ref 150–450)
RBC: 2.58 x10E6/uL — CL (ref 3.77–5.28)
RDW: 13.2 % (ref 11.7–15.4)
WBC: 5.6 10*3/uL (ref 3.4–10.8)

## 2023-01-17 LAB — CMP14+EGFR
ALT: 11 IU/L (ref 0–32)
AST: 18 IU/L (ref 0–40)
Albumin: 4.1 g/dL (ref 3.8–4.8)
Alkaline Phosphatase: 84 IU/L (ref 44–121)
BUN/Creatinine Ratio: 20 (ref 12–28)
BUN: 31 mg/dL — ABNORMAL HIGH (ref 8–27)
Bilirubin Total: 0.2 mg/dL (ref 0.0–1.2)
CO2: 19 mmol/L — ABNORMAL LOW (ref 20–29)
Calcium: 9.9 mg/dL (ref 8.7–10.3)
Chloride: 107 mmol/L — ABNORMAL HIGH (ref 96–106)
Creatinine, Ser: 1.55 mg/dL — ABNORMAL HIGH (ref 0.57–1.00)
Globulin, Total: 2.5 g/dL (ref 1.5–4.5)
Glucose: 130 mg/dL — ABNORMAL HIGH (ref 70–99)
Potassium: 5.1 mmol/L (ref 3.5–5.2)
Sodium: 140 mmol/L (ref 134–144)
Total Protein: 6.6 g/dL (ref 6.0–8.5)
eGFR: 34 mL/min/{1.73_m2} — ABNORMAL LOW (ref >59)

## 2023-01-17 LAB — LIPID PANEL
Chol/HDL Ratio: 2.3 ratio (ref 0.0–4.4)
Cholesterol, Total: 116 mg/dL (ref 100–199)
HDL: 51 mg/dL (ref >39)
LDL Chol Calc (NIH): 44 mg/dL (ref 0–99)
Triglycerides: 117 mg/dL (ref 0–149)
VLDL Cholesterol Cal: 21 mg/dL (ref 5–40)

## 2023-01-17 LAB — HEMOGLOBIN A1C
Est. average glucose Bld gHb Est-mCnc: 85 mg/dL
Hgb A1c MFr Bld: 4.6 % — ABNORMAL LOW (ref 4.8–5.6)

## 2023-01-17 LAB — TSH: TSH: 7.81 u[IU]/mL — ABNORMAL HIGH (ref 0.450–4.500)

## 2023-01-17 NOTE — Assessment & Plan Note (Signed)
Controlled, no change in medication  

## 2023-01-17 NOTE — Assessment & Plan Note (Signed)
Hyperlipidemia:Low fat diet discussed and encouraged.   Lipid Panel  Lab Results  Component Value Date   CHOL 116 01/15/2023   HDL 51 01/15/2023   LDLCALC 44 01/15/2023   TRIG 117 01/15/2023   CHOLHDL 2.3 01/15/2023     Controlled, no change in medication

## 2023-01-17 NOTE — Assessment & Plan Note (Signed)
Three episodes of asymptomatic anemia due to blood loss , in 4 months, currently on xarelto, Cardiology evaluation upcoming, pt requesting sooner appt with her Cardiology MD to discuss, will send message to both nP who has been treating her  and theDr Diona Browner

## 2023-01-17 NOTE — Assessment & Plan Note (Signed)
Patient in for follow up of recent eD visit Discharge summary, and laboratory and radiology data are reviewed, and any questions or concerns  are discussed. Specific issues requiring follow up are specifically addressed.

## 2023-01-21 ENCOUNTER — Other Ambulatory Visit: Payer: Self-pay | Admitting: Family Medicine

## 2023-01-21 ENCOUNTER — Encounter: Payer: Medicare Other | Admitting: Nurse Practitioner

## 2023-01-21 ENCOUNTER — Encounter: Payer: Self-pay | Admitting: Nurse Practitioner

## 2023-01-21 VITALS — BP 122/60 | HR 76 | Ht 61.0 in | Wt 150.4 lb

## 2023-01-21 DIAGNOSIS — D649 Anemia, unspecified: Secondary | ICD-10-CM

## 2023-01-21 DIAGNOSIS — I6523 Occlusion and stenosis of bilateral carotid arteries: Secondary | ICD-10-CM

## 2023-01-21 DIAGNOSIS — I4891 Unspecified atrial fibrillation: Secondary | ICD-10-CM | POA: Diagnosis not present

## 2023-01-21 DIAGNOSIS — I10 Essential (primary) hypertension: Secondary | ICD-10-CM

## 2023-01-21 DIAGNOSIS — N183 Chronic kidney disease, stage 3 unspecified: Secondary | ICD-10-CM

## 2023-01-21 DIAGNOSIS — E785 Hyperlipidemia, unspecified: Secondary | ICD-10-CM

## 2023-01-21 DIAGNOSIS — R5383 Other fatigue: Secondary | ICD-10-CM | POA: Diagnosis not present

## 2023-01-21 DIAGNOSIS — I2510 Atherosclerotic heart disease of native coronary artery without angina pectoris: Secondary | ICD-10-CM

## 2023-01-21 MED ORDER — ASPIRIN 81 MG PO TBEC: 81.0000 mg | DELAYED_RELEASE_TABLET | Freq: Every day | ORAL | 4 refills | Status: DC

## 2023-01-21 NOTE — Progress Notes (Signed)
Office Visit    Patient Name: [name] [name] Date of Encounter: 01/21/2023 PCP:  Kerri Perches, MD [facility]  Cardiologist:  Nona Dell, MD  Advanced Practice Provider:  Sharlene Dory, NP Electrophysiologist:  None   Chief Complaint and HPI    [name] [name] is a 82 y.o. female with a hx of A-fib, CAD, s/p CABG in 2005, HTN, HLD, carotid artery stenosis, hypothyroidism, CKD stage 3 (follows Dr. Wolfgang Phoenix), IDA, anemia, and recurrent bleeding who presents today for A-fib follow-up.  EKG showed new diagnosis of A-fib 08/2022 office visit.  Patient was asymptomatic and did not know she was in A-fib.  Was later on started on Xarelto d/t CHA2DS2-VASc score of 5.  I was contacted by PCP last week and notified that patient has had 3 bleeding episodes since starting Xarelto.  This is also resulted in her receiving blood transfusions.  She is closely being followed by Dr. Ellin Saba with hematology/oncology.  Says she had PET scan performed in June 2024, stated he did not find any tumors or polyps, small right pleural effusion noted.  She does admit to some fatigue.  Has bled score 3.  Discussed risk versus benefits of OAC, and came to the conclusion through shared medical decision making to stop Xarelto today and will restart aspirin at 81 mg daily.  She will not qualify or be a good candidate for Watchman procedure as she cannot tolerate temporary OAC prior to this procedure as well. Denies any chest pain, shortness of breath, palpitations, syncope, presyncope, dizziness, orthopnea, PND, swelling or significant weight changes, acute bleeding, or claudication.   EKGs/Labs/Other Studies Reviewed:   The following studies were reviewed today:   EKG:   EKG Interpretation Date/Time:  Monday January 21 2023 13:06:08 EDT Ventricular Rate:  78 PR Interval:    QRS Duration:  86 QT Interval:  392 QTC Calculation: 446 R Axis:   91  Text Interpretation: Atrial  fibrillation Rightward axis ST & T wave abnormality, consider inferolateral ischemia When compared with ECG of 12-Dec-2022 12:14, PREVIOUS ECG IS PRESENT Confirmed by Sharlene Dory [phone]) on 01/21/2023 1:18:35 PM   Echo 10/2022:  1. Left ventricular ejection fraction, by estimation, is 70 to 75%. The  left ventricle has hyperdynamic function. The left ventricle has no  regional wall motion abnormalities. There is mild concentric left  ventricular hypertrophy. Left ventricular diastolic parameters are indeterminate. The average left ventricular global longitudinal strain is -19.5 %. The global longitudinal strain is normal.   2. Right ventricular systolic function is normal. The right ventricular  size is normal. There is normal pulmonary artery systolic pressure. The estimated right ventricular systolic pressure is 31.1 mmHg.   3. Left atrial size was severely dilated.   4. Coronary sinus dilated.   5. Right atrial size was mildly dilated.   6. A small pericardial effusion is present. The pericardial effusion is  posterior to the left ventricle.   7. The mitral valve is grossly normal. Mild mitral valve regurgitation.   8. The aortic valve is tricuspid. There is mild calcification of the  aortic valve. Aortic valve regurgitation is trivial. Aortic valve  sclerosis/calcification is present, without any evidence of aortic  stenosis. Aortic valve mean gradient measures 7.5  mmHg.   9. The inferior vena cava is normal in size with greater than 50%  respiratory variability, suggesting right atrial pressure of 3 mmHg.   Comparison(s): Prior images reviewed side by side. LVEF vigorous at  70-75%. Normal RV contraction and estimated RVSP. Severely dilated left atrium.  Cardiac monitor 10/2022:  Predominant rhythm is atrial fibrillation with heart rate ranging from 39 bpm up to 148 bpm and average heart rate 69 bpm. Occasional PVCs were noted representing 3.2% total beats.  Also brief episodes of  ventricular bigeminy and trigeminy. Two brief episodes of probable aberrant atrial fibrillation with RVR noted.  QRS appears different from PVC morphology so doubt NSVT. No pauses or high degree heart block.  Carotid doppler 08/2022: Summary:  Right Carotid: Velocities in the right ICA are consistent with a 60-79% stenosis.   Left Carotid: Velocities in the left ICA are consistent with a 40-59%  stenosis. The ECA appears >50% stenosed.   Vertebrals: Right vertebral artery demonstrates antegrade flow. Left  vertebral artery is antegrade with an atypical waveform.   Review of Systems    All other systems reviewed and are otherwise negative except as noted above.  Physical Exam    VS:  BP 122/60   Pulse 76   Ht 5\' 1"  (1.549 m)   Wt 150 lb 6.4 oz (68.2 kg)   SpO2 99%   BMI 28.42 kg/m  , BMI Body mass index is 28.42 kg/m.  Wt Readings from Last 3 Encounters:  01/21/23 150 lb 6.4 oz (68.2 kg)  01/15/23 150 lb 0.6 oz (68.1 kg)  01/09/23 150 lb (68 kg)     GEN: Well nourished, well developed, in no acute distress. HEENT: normal. Neck: Supple, no JVD, carotid bruits, or masses. Cardiac: S1/S2, irregular rhythm and regular rate, no murmurs, rubs, or gallops. No clubbing, cyanosis, edema.  Radials/PT 2+ and equal bilaterally.  Respiratory:  Respirations regular and unlabored, clear to auscultation bilaterally. MS: No deformity or atrophy. Skin: Warm and dry, no rash. Neuro:  Strength and sensation are intact. Psych: Normal affect.  Assessment & Plan    A-fib, hx of bleeding Diagnosed 08/2022. Pt denies palpitations or tachycardia. Unknown triggers. Hx of recurrent bleeding since starting Xarelto, discussed risks vs benefits with patient of OAC and came to shared medical decision with patient to stop Xarelto and restart Aspirin 81 mg daily starting tomorrow morning.  HR well controlled. Continue Metoprolol for rate control. Not a candidate for DCCV or Flecainide/Watchman device.  Heart healthy diet and regular cardiovascular exercise encouraged.   CAD, s/p CABG in 2005 Stable with no anginal symptoms. No indication for ischemic evaluation. No medication changes at this time. Heart healthy diet and regular cardiovascular exercise encouraged.  Restarting Aspirin 81 mg daily starting tomorrow morning as mentioned above and stopping Xarelto.   HTN BP stable. Discussed to monitor BP at home at least 2 hours after medications and sitting for 5-10 minutes. Continue amlodipine, hydralazine, Toprol XL, and telmisartan. Heart healthy diet and regular cardiovascular exercise encouraged.   HLD, carotid artery stenosis Continue crestor. Labs managed by PCP. Carotid dopplers 08/2022 showed stable bilateral carotid artery disease, 60-79% R ICA stenosis, 40-59% L ICA stenosis. Plan to recheck doppler in February 2025. Heart healthy diet and regular cardiovascular exercise encouraged.   5. Fatigue, chronic anemia Etiology multifactorial. Most likely d/t chronic anemia. Continue to follow-up with Hem/Onc and PCP. Discontinuing Xarelto and restarting Aspirin 81 mg daily as mentioned above. Heart healthy diet and regular cardiovascular exercise encouraged.   6. CKD stage 3 Most recent sCr 1.55 with eGFR at 34. Avoid nephrotoxic agents. No other medication changes at this time. Continue to follow with PCP and Dr. Wolfgang Phoenix.   Disposition: Follow  up with Nona Dell, MD or APP as scheduled.  Signed, Sharlene Dory, NP 01/21/2023, 4:12 PM [facility]

## 2023-01-21 NOTE — Patient Instructions (Signed)
Medication Instructions:  Your physician has recommended you make the following change in your medication:  Stop taking Xarelto Start taking Aspirin 81 Mg Daily  Continue all other medications as prescribed.   Labwork: none  Testing/Procedures: none  Follow-Up: Your physician recommends that you schedule a follow-up appointment in: Follow up with Dr.McDowell as scheduled   Any Other Special Instructions Will Be Listed Below (If Applicable).  If you need a refill on your cardiac medications before your next appointment, please call your pharmacy.

## 2023-01-23 ENCOUNTER — Encounter (HOSPITAL_COMMUNITY): Payer: Medicare Other

## 2023-01-25 ENCOUNTER — Other Ambulatory Visit: Payer: Self-pay

## 2023-01-25 DIAGNOSIS — D631 Anemia in chronic kidney disease: Secondary | ICD-10-CM

## 2023-01-28 ENCOUNTER — Ambulatory Visit (HOSPITAL_COMMUNITY)
Admission: RE | Admit: 2023-01-28 | Discharge: 2023-01-28 | Disposition: A | Discharge status: Home / Self Care | Payer: Medicare Other | Source: Ambulatory Visit | Attending: Family Medicine | Admitting: Family Medicine

## 2023-01-28 ENCOUNTER — Inpatient Hospital Stay: Payer: Medicare Other

## 2023-01-28 ENCOUNTER — Encounter (HOSPITAL_COMMUNITY): Payer: Self-pay

## 2023-01-28 VITALS — BP 116/49 | HR 68 | Temp 96.9°F | Resp 16

## 2023-01-28 DIAGNOSIS — C7A010 Malignant carcinoid tumor of the duodenum: Secondary | ICD-10-CM | POA: Diagnosis not present

## 2023-01-28 DIAGNOSIS — Z79899 Other long term (current) drug therapy: Secondary | ICD-10-CM | POA: Diagnosis not present

## 2023-01-28 DIAGNOSIS — D631 Anemia in chronic kidney disease: Secondary | ICD-10-CM

## 2023-01-28 DIAGNOSIS — Z1231 Encounter for screening mammogram for malignant neoplasm of breast: Secondary | ICD-10-CM | POA: Insufficient documentation

## 2023-01-28 DIAGNOSIS — D500 Iron deficiency anemia secondary to blood loss (chronic): Secondary | ICD-10-CM

## 2023-01-28 LAB — TYPE AND SCREEN
ABO/RH(D): A POS
Antibody Screen: NEGATIVE
Unit division: 0

## 2023-01-28 LAB — CBC
HCT: 23.8 % — ABNORMAL LOW (ref 36.0–46.0)
Hemoglobin: 7.2 g/dL — ABNORMAL LOW (ref 12.0–15.0)
MCH: 31 pg (ref 26.0–34.0)
MCHC: 30.3 g/dL (ref 30.0–36.0)
MCV: 102.6 fL — ABNORMAL HIGH (ref 80.0–100.0)
Platelets: 242 10*3/uL (ref 150–400)
RBC: 2.32 MIL/uL — ABNORMAL LOW (ref 3.87–5.11)
RDW: 15.1 % (ref 11.5–15.5)
WBC: 5.5 10*3/uL (ref 4.0–10.5)
nRBC: 0 % (ref 0.0–0.2)

## 2023-01-28 LAB — SAMPLE TO BLOOD BANK

## 2023-01-28 LAB — BPAM RBC
Blood Product Expiration Date: 202408302359
Unit Type and Rh: 6200

## 2023-01-28 LAB — PREPARE RBC (CROSSMATCH)

## 2023-01-28 MED ORDER — EPOETIN ALFA-EPBX 10000 UNIT/ML IJ SOLN
10000.0000 [IU] | Freq: Once | INTRAMUSCULAR | Status: AC
  Administered 2023-01-28: 10000 [IU] via SUBCUTANEOUS
  Filled 2023-01-28: qty 1

## 2023-01-28 NOTE — Addendum Note (Signed)
Addended by: Dicky Doe D on: 01/28/2023 03:30 PM   Modules accepted: Orders

## 2023-01-28 NOTE — Progress Notes (Signed)
HGB 7.2 . Blood bank notified and 1 unit of blood products ordered. Patient aware of appointment time and date. 01-30-2023 @08 :15 am.

## 2023-01-28 NOTE — Progress Notes (Signed)
Hgb 7.2 today and to receive one unit of blood Wednesday verbal order Rojelio Brenner, PA.    Patient tolerated injection with no complaints voiced.  Site clean and dry with no bruising or swelling noted at site.  See MAR for details.  Band aid applied.  Patient stable during and after injection.  Vss with discharge and left in satisfactory condition with no s/s of distress noted.

## 2023-01-29 ENCOUNTER — Other Ambulatory Visit: Payer: Self-pay | Admitting: Cardiology

## 2023-01-30 ENCOUNTER — Inpatient Hospital Stay: Payer: Medicare Other

## 2023-01-30 ENCOUNTER — Other Ambulatory Visit: Payer: Self-pay | Admitting: Physician Assistant

## 2023-01-30 DIAGNOSIS — D649 Anemia, unspecified: Secondary | ICD-10-CM

## 2023-01-30 DIAGNOSIS — N1832 Chronic kidney disease, stage 3b: Secondary | ICD-10-CM

## 2023-01-30 DIAGNOSIS — C7A010 Malignant carcinoid tumor of the duodenum: Secondary | ICD-10-CM | POA: Diagnosis not present

## 2023-01-30 DIAGNOSIS — Z79899 Other long term (current) drug therapy: Secondary | ICD-10-CM | POA: Diagnosis not present

## 2023-01-30 MED ORDER — DIPHENHYDRAMINE HCL 25 MG PO CAPS
25.0000 mg | ORAL_CAPSULE | Freq: Once | ORAL | Status: AC
  Administered 2023-01-30: 25 mg via ORAL
  Filled 2023-01-30: qty 1

## 2023-01-30 MED ORDER — SODIUM CHLORIDE 0.9% IV SOLUTION
250.0000 mL | Freq: Once | INTRAVENOUS | Status: AC
  Administered 2023-01-30: 250 mL via INTRAVENOUS

## 2023-01-30 MED ORDER — ACETAMINOPHEN 325 MG PO TABS
650.0000 mg | ORAL_TABLET | Freq: Once | ORAL | Status: AC
  Administered 2023-01-30: 650 mg via ORAL

## 2023-01-30 NOTE — Patient Instructions (Signed)

## 2023-01-30 NOTE — Progress Notes (Signed)
Patient presents today for 1 unit of PRBC.  Patient is in satisfactory condition with no new complaints voiced.  Vital signs are stable.  Hemoglobin on 01/28/23 was 7.2.  IV placed in right AC.  IV flushed well with good blood return noted.  We will proceed with transfusion per provider orders.   1010:  IV pump was alarming occluded.  Patient denied any pain or discomfort.  Area was not red or swollen.  IV was discontinued as repositioning did not help with IV pump alarm.  A new IV was placed in L hand.  IV flushed well with good blood return noted.    Patient tolerated transfusion well with no complaints voiced.  Patient left via wheelchair in stable condition.  Vital signs stable at discharge.  Follow up as scheduled.

## 2023-02-01 ENCOUNTER — Other Ambulatory Visit: Payer: Self-pay

## 2023-02-01 DIAGNOSIS — D649 Anemia, unspecified: Secondary | ICD-10-CM

## 2023-02-04 ENCOUNTER — Inpatient Hospital Stay: Payer: Medicare Other | Admitting: Oncology

## 2023-02-04 ENCOUNTER — Inpatient Hospital Stay: Payer: Medicare Other | Attending: Hematology

## 2023-02-04 ENCOUNTER — Inpatient Hospital Stay: Payer: Medicare Other

## 2023-02-04 VITALS — BP 121/56 | HR 66 | Temp 97.6°F | Resp 16 | Wt 151.8 lb

## 2023-02-04 DIAGNOSIS — D472 Monoclonal gammopathy: Secondary | ICD-10-CM | POA: Diagnosis not present

## 2023-02-04 DIAGNOSIS — Z885 Allergy status to narcotic agent status: Secondary | ICD-10-CM | POA: Diagnosis not present

## 2023-02-04 DIAGNOSIS — Z7982 Long term (current) use of aspirin: Secondary | ICD-10-CM | POA: Insufficient documentation

## 2023-02-04 DIAGNOSIS — D500 Iron deficiency anemia secondary to blood loss (chronic): Secondary | ICD-10-CM | POA: Insufficient documentation

## 2023-02-04 DIAGNOSIS — I129 Hypertensive chronic kidney disease with stage 1 through stage 4 chronic kidney disease, or unspecified chronic kidney disease: Secondary | ICD-10-CM | POA: Insufficient documentation

## 2023-02-04 DIAGNOSIS — C7A010 Malignant carcinoid tumor of the duodenum: Secondary | ICD-10-CM | POA: Insufficient documentation

## 2023-02-04 DIAGNOSIS — E039 Hypothyroidism, unspecified: Secondary | ICD-10-CM | POA: Diagnosis not present

## 2023-02-04 DIAGNOSIS — Z8379 Family history of other diseases of the digestive system: Secondary | ICD-10-CM | POA: Diagnosis not present

## 2023-02-04 DIAGNOSIS — E669 Obesity, unspecified: Secondary | ICD-10-CM | POA: Insufficient documentation

## 2023-02-04 DIAGNOSIS — Z79890 Hormone replacement therapy (postmenopausal): Secondary | ICD-10-CM | POA: Diagnosis not present

## 2023-02-04 DIAGNOSIS — R5383 Other fatigue: Secondary | ICD-10-CM | POA: Diagnosis not present

## 2023-02-04 DIAGNOSIS — Z79899 Other long term (current) drug therapy: Secondary | ICD-10-CM | POA: Diagnosis not present

## 2023-02-04 DIAGNOSIS — D631 Anemia in chronic kidney disease: Secondary | ICD-10-CM

## 2023-02-04 DIAGNOSIS — N1832 Chronic kidney disease, stage 3b: Secondary | ICD-10-CM

## 2023-02-04 DIAGNOSIS — Z841 Family history of disorders of kidney and ureter: Secondary | ICD-10-CM | POA: Insufficient documentation

## 2023-02-04 DIAGNOSIS — D509 Iron deficiency anemia, unspecified: Secondary | ICD-10-CM

## 2023-02-04 DIAGNOSIS — R002 Palpitations: Secondary | ICD-10-CM | POA: Insufficient documentation

## 2023-02-04 DIAGNOSIS — Z800 Family history of malignant neoplasm of digestive organs: Secondary | ICD-10-CM | POA: Insufficient documentation

## 2023-02-04 DIAGNOSIS — I4891 Unspecified atrial fibrillation: Secondary | ICD-10-CM | POA: Diagnosis not present

## 2023-02-04 DIAGNOSIS — Z7901 Long term (current) use of anticoagulants: Secondary | ICD-10-CM | POA: Diagnosis not present

## 2023-02-04 DIAGNOSIS — Z888 Allergy status to other drugs, medicaments and biological substances status: Secondary | ICD-10-CM | POA: Diagnosis not present

## 2023-02-04 DIAGNOSIS — D649 Anemia, unspecified: Secondary | ICD-10-CM

## 2023-02-04 DIAGNOSIS — K922 Gastrointestinal hemorrhage, unspecified: Secondary | ICD-10-CM | POA: Insufficient documentation

## 2023-02-04 DIAGNOSIS — E785 Hyperlipidemia, unspecified: Secondary | ICD-10-CM | POA: Diagnosis not present

## 2023-02-04 DIAGNOSIS — Z825 Family history of asthma and other chronic lower respiratory diseases: Secondary | ICD-10-CM | POA: Insufficient documentation

## 2023-02-04 DIAGNOSIS — Z803 Family history of malignant neoplasm of breast: Secondary | ICD-10-CM | POA: Diagnosis not present

## 2023-02-04 DIAGNOSIS — Z833 Family history of diabetes mellitus: Secondary | ICD-10-CM | POA: Diagnosis not present

## 2023-02-04 DIAGNOSIS — Z87891 Personal history of nicotine dependence: Secondary | ICD-10-CM | POA: Insufficient documentation

## 2023-02-04 LAB — CBC
HCT: 29.7 % — ABNORMAL LOW (ref 36.0–46.0)
Hemoglobin: 9.2 g/dL — ABNORMAL LOW (ref 12.0–15.0)
MCH: 30.9 pg (ref 26.0–34.0)
MCHC: 31 g/dL (ref 30.0–36.0)
MCV: 99.7 fL (ref 80.0–100.0)
Platelets: 218 10*3/uL (ref 150–400)
RBC: 2.98 MIL/uL — ABNORMAL LOW (ref 3.87–5.11)
RDW: 14.6 % (ref 11.5–15.5)
WBC: 4.8 10*3/uL (ref 4.0–10.5)
nRBC: 0 % (ref 0.0–0.2)

## 2023-02-04 LAB — IRON AND TIBC
Iron: 81 ug/dL (ref 28–170)
Saturation Ratios: 22 % (ref 10.4–31.8)
TIBC: 362 ug/dL (ref 250–450)
UIBC: 281 ug/dL

## 2023-02-04 LAB — SAMPLE TO BLOOD BANK

## 2023-02-04 LAB — FERRITIN: Ferritin: 31 ng/mL (ref 11–307)

## 2023-02-04 MED ORDER — EPOETIN ALFA-EPBX 10000 UNIT/ML IJ SOLN
10000.0000 [IU] | Freq: Once | INTRAMUSCULAR | Status: AC
  Administered 2023-02-04: 10000 [IU] via SUBCUTANEOUS
  Filled 2023-02-04: qty 1

## 2023-02-04 NOTE — Patient Instructions (Signed)

## 2023-02-04 NOTE — Progress Notes (Signed)
Patient tolerated injection with no complaints voiced.  Site clean and dry with no bruising or swelling noted at site.  See MAR for details.  Band aid applied.  Patient stable during and after injection.  Vss with discharge and left in satisfactory condition with no s/s of distress noted.  

## 2023-02-04 NOTE — Progress Notes (Signed)
[facility] [address], [place] [address]    Clinic Day:  02/04/2023  Referring physician: Kerri Perches, MD  Patient Care Team: Kerri Perches, MD as PCP - General (Family Medicine) Jonelle Sidle, MD as PCP - Cardiology (Cardiology) Luis Abed, MD as Consulting Physician (Cardiology) Sallye Lat, MD as Consulting Physician (Optometry) Adam Phenix, DPM as Consulting Physician (Podiatry) Sharlene Dory, NP as Nurse Practitioner (Cardiology) Doreatha Massed, MD as Medical Oncologist (Hematology and Oncology)   ASSESSMENT & PLAN:   Assessment: 1.  Duodenal neuroendocrine tumor: - 04/27/2021 (duodenal mass biopsy): Well-differentiated carcinoid tumor, grade 1, Ki-67 1%. - Dotatate PET (05/2021): No focal activity within the duodenum or evidence of metastatic disease. - EGD/EUS (08/2021): Submucosal nodule found in the duodenum-consistent with previously biopsied duodenal NET.  An intramural subepithelial lesion found in the second part of the duodenum.  Lesion appears to originate from within the deep mucosa and submucosa.  She was not felt to be candidate for EMR. - Patient had traumatic experience after the EUS and did not want to pursue further options and declined a referral to [facility]. - Denies any diarrhea/flushing/wheezing/abdominal pain.  No obstructive symptoms.   2.  Normocytic anemia: - More than 5-year history of iron deficiency anemia secondary to GI bleed.  She underwent multiple EGDs and colonoscopies and 2 small bowel capsule endoscopies. - She reports taking iron tablet daily for the past 3 years.  Denies any ice pica. - Denies any BRBPR/melena.  She had blood transfusion 2 weeks ago.   3.  Social/family history: - Lives at home with her husband.  Independent of ADLs and IADLs.  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  No chemical exposure.  Smoked half pack per day starting  at age 22 and quit in 2005. - Brother had colon cancer.  Older brother had liver cancer.    Plan: 1.  Normocytic anemia: - Multifactorial anemia from CKD, iron deficiency and chronic blood loss.  -Was placed on Eliquis by Dr. Diona Browner cardiology for atrial fibrillation.  Given frequent blood transfusions, she was taken off of Eliquis as of 01/21/2023 and put on 81 mg aspirin only.  Has not noticed any bleeding. - Status post 1 unit PRBC on 12/12/2022 for hemoglobin 7.6 on 01/30/23 for hemoglobin of 7.2.  - Labs from 02/04/2023 show a hemoglobin of 9.2 with MCV of 99.7.  Iron panel shows iron saturation 22% and ferritin 31.  -She received 2 doses of IV Feraheme on 12/18/2022 and 12/28/2022. -She could benefit from 2 additional doses of IV Feraheme.  -She continues Retacrit every 2 weeks for hemoglobin less then 11.  -Recommend Retacrit 10,000 units today.   2.  Duodenal carcinoid tumor: - Chromogranin (11/16/2022): 847 - 24-hour urine 5-HIAA (11/21/2022): Normal - Reviewed dotatate PET scan from 12/06/2022: No evidence of neuroendocrine tumor or metastasis.  3.  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  Kappa light chains of 56, lambda light chains 36 and ratio of 1.55.  Marland Kitchen Immunofixation shows IgG kappa monoclonal protein.  Calcium is normal and her creatinine is at baseline.  We will closely monitor at this time.   PLAN SUMMARY: >> Continue biweekly 10,000 units Retacrit for hemoglobin less then 11. >> Recommend 2 additional doses of IV Feraheme.  >> Recommend follow-up with lab work in 8 to 12 weeks and possible additional IV  iron.     No orders of the defined types were placed in this encounter.  I spent 25 minutes dedicated to the care of this patient (face-to-face and non-face-to-face) on the date of the encounter to include what is described in the assessment and plan.   Mauro Kaufmann, NP   8/5/202410:09 AM  CHIEF COMPLAINT:    Diagnosis: history of NET and anemia    Cancer Staging  No matching staging information was found for the patient.    Prior Therapy: none  Current Therapy:   retacrit injections, monthly    HISTORY OF PRESENT ILLNESS:   Oncology History   No history exists.     INTERVAL HISTORY:   [name] [name] is a 82 y.o. female presenting to clinic today for follow up of history of NET and anemia. She was last seen in clinic on 12/18/2022 for initial consultation for anemia.  Since her last visit she required 2 units of PRBCs one on 01/09/2023 for hemoglobin of 6.7 and again on 01/28/2023 for hemoglobin of 7.2.  She is also been receiving Retacrit every 2 weeks.   Today, she reports she has not noticed any hematochezia, melena, gum bleeding or epistaxis.  Appetite is at 100% and energy levels are 75% denies any pain.  Has occasional palpitation but has a history of atrial fibrillation.  She was recently taken off of Xarelto and placed on aspirin 81 mg due to anemia.  Reports fatigue but this is her baseline.  Denies any worsening fatigue.  Reports she does all of her housework and cares for her 82 year old husband and rest as needed.  PAST MEDICAL HISTORY:   Past Medical History: Past Medical History:  Diagnosis Date   A-fib (HCC)    Blood transfusion without reported diagnosis    CAD (coronary artery disease)    Multivessel status post CABG October 2005   Carotid artery disease (HCC)    CKD (chronic kidney disease), stage III (HCC)    Essential hypertension    Glaucoma    Laser surgery 2010   Hyperlipidemia    Hypothyroidism    Iron deficiency anemia    Obesity     Surgical History: Past Surgical History:  Procedure Laterality Date   BIOPSY  04/27/2021   Procedure: BIOPSY;  Surgeon: Rachael Fee, MD;  Location: [facility];  Service: Endoscopy;;   COLONOSCOPY N/A 02/04/2013   Procedure: COLONOSCOPY;  Surgeon: Malissa Hippo, MD;  Location: [facility];  Service: Endoscopy;   Laterality: N/A;  1015   COLONOSCOPY N/A 12/21/2016   Procedure: COLONOSCOPY;  Surgeon: Malissa Hippo, MD;  [facility]: [facility];  Service: Endoscopy;  Laterality: N/A;   COLONOSCOPY WITH PROPOFOL N/A 03/22/2021   Procedure: COLONOSCOPY WITH PROPOFOL;  Surgeon: Malissa Hippo, MD;  [facility]: [facility];  Service: Endoscopy;  Laterality: N/A;  8:15   CORONARY ARTERY BYPASS GRAFT  October 2005   Dr. Cornelius Moras - LIMA to LAD, SVG to OM 2 and OM 3, SVG to PDA   ESOPHAGOGASTRODUODENOSCOPY N/A 12/21/2016   Procedure: ESOPHAGOGASTRODUODENOSCOPY (EGD);  Surgeon: Malissa Hippo, MD;  [facility]: [facility];  Service: Endoscopy;  Laterality: N/A;  11:10   ESOPHAGOGASTRODUODENOSCOPY N/A 02/27/2018   Procedure: ESOPHAGOGASTRODUODENOSCOPY (EGD);  Surgeon: Malissa Hippo, MD;  [facility];  Service: Endoscopy;  Laterality: N/A;   ESOPHAGOGASTRODUODENOSCOPY (EGD) WITH PROPOFOL N/A 04/27/2021   Procedure: ESOPHAGOGASTRODUODENOSCOPY (EGD) WITH PROPOFOL;  Surgeon: Rachael Fee, MD;  Location: [facility];  Service: Endoscopy;  Laterality: N/A;   ESOPHAGOGASTRODUODENOSCOPY (EGD) WITH PROPOFOL N/A 08/17/2021   Procedure: ESOPHAGOGASTRODUODENOSCOPY (EGD) WITH PROPOFOL;  Surgeon: Meridee Score Netty Starring., MD;  Location: [facility] ENDOSCOPY;  Service: Gastroenterology;  Laterality: N/A;   EUS N/A 04/27/2021   Procedure: UPPER ENDOSCOPIC ULTRASOUND (EUS) RADIAL;  Surgeon: Rachael Fee, MD;  Location: [facility];  Service: Endoscopy;  Laterality: N/A;   EYE SURGERY  2010   laser treatment to both eyes for glaucoma   GIVENS CAPSULE STUDY N/A 03/07/2018   Procedure: GIVENS CAPSULE STUDY;  Surgeon: Malissa Hippo, MD;  [facility];  Service: Endoscopy;  Laterality: N/A;   GIVENS CAPSULE STUDY N/A 02/05/2020   Procedure: GIVENS CAPSULE STUDY;  Surgeon: Malissa Hippo, MD;  [facility];  Service: Endoscopy;  Laterality: N/A;  730   POLYPECTOMY  03/22/2021   Procedure:  POLYPECTOMY;  Surgeon: Malissa Hippo, MD;  [facility];  Service: Endoscopy;;   UPPER ESOPHAGEAL ENDOSCOPIC ULTRASOUND (EUS) N/A 08/17/2021   Procedure: UPPER ESOPHAGEAL ENDOSCOPIC ULTRASOUND (EUS);  Surgeon: Lemar Lofty., MD;  Location: [facility] ENDOSCOPY;  Service: Gastroenterology;  Laterality: N/A;    Social History: Social History   Socioeconomic History   Marital status: Married    Spouse name: Not on file   Number of children: Not on file   Years of education: Not on file   Highest education level: Not on file  Occupational History   Occupation: retired   Tobacco Use   Smoking status: Former    Current packs/day: 0.00    Average packs/day: 0.3 packs/day for 40.0 years (10.0 ttl pk-yrs)    Types: Cigarettes    Start date: 07/03/1963    Quit date: 07/03/2003    Years since quitting: 19.6    Passive exposure: Never   Smokeless tobacco: Never  Vaping Use   Vaping status: Never Used  Substance and Sexual Activity   Alcohol use: No    Alcohol/week: 0.0 standard drinks of alcohol   Drug use: No   Sexual activity: Never  Other Topics Concern   Not on file  Social History Narrative   Not on file   Social Determinants of Health   Financial Resource Strain: Low Risk  (12/19/2022)   Overall Financial Resource Strain (CARDIA)    Difficulty of Paying Living Expenses: Not hard at all  Food Insecurity: No Food Insecurity (12/19/2022)   Hunger Vital Sign    Worried About Running Out of Food in the Last Year: Never true    Ran Out of Food in the Last Year: Never true  Transportation Needs: No Transportation Needs (12/19/2022)   PRAPARE - Administrator, Civil Service (Medical): No    Lack of Transportation (Non-Medical): No  Physical Activity: Insufficiently Active (12/19/2022)   Exercise Vital Sign    Days of Exercise per Week: 5 days    Minutes of Exercise per Session: 10 min  Stress: No Stress Concern Present (12/19/2022)   [employer] of  Occupational Health - Occupational Stress Questionnaire    Feeling of Stress : Not at all  Social Connections: Socially Integrated (12/19/2022)   Social Connection and Isolation Panel [NHANES]    Frequency of Communication with Friends and Family: More than three times a week    Frequency of Social Gatherings with Friends and Family: More than three times a week    Attends Religious Services: More than 4 times per year    Active Member of  Clubs or Organizations: Yes    Attends Banker Meetings: More than 4 times per year    Marital Status: Married  Catering manager Violence: Not At Risk (12/19/2022)   Humiliation, Afraid, Rape, and Kick questionnaire    Fear of Current or Ex-Partner: No    Emotionally Abused: No    Physically Abused: No    Sexually Abused: No    Family History: Family History  Problem Relation Age of Onset   Aneurysm Mother    Hypertension Mother    Coronary artery disease Mother    Coronary artery disease Father    Diabetes Sister    Coronary artery disease Sister    Diverticulosis Sister    COPD Sister    Kidney disease Sister    Diabetes Sister    Hypertension Sister    Breast cancer Sister 43   Hearing loss Sister    Heart Problems Sister        pacemaker   Colon cancer Brother 64       lived 18 months afterwards   Liver cancer Brother 35   Diabetes Son    Pancreatic cancer Son    Esophageal cancer Neg Hx    Inflammatory bowel disease Neg Hx    Stomach cancer Neg Hx     Current Medications:  Current Outpatient Medications:    amLODipine (NORVASC) 5 MG tablet, Take 1 tablet (5 mg total) by mouth daily. TAKE ONE TABLET BY MOUTH TWICE DAILY FOR BLOOD PRESSURE, Disp: 90 tablet, Rfl: 5   aspirin EC 81 MG tablet, Take 1 tablet (81 mg total) by mouth daily. Swallow whole., Disp: 90 tablet, Rfl: 4   cetirizine (ZYRTEC) 10 MG tablet, Take 1 tablet (10 mg total) by mouth daily., Disp: 90 tablet, Rfl: 1   famotidine (PEPCID) 20 MG tablet, TAKE  1 TABLET BY MOUTH TWICE  DAILY, Disp: 120 tablet, Rfl: 5   ferrous sulfate 325 (65 FE) MG tablet, Take 325 mg by mouth daily with breakfast., Disp: , Rfl:    fish oil-omega-3 fatty acids 1000 MG capsule, Take 1 g by mouth daily., Disp: , Rfl:    fluticasone (FLONASE) 50 MCG/ACT nasal spray, USE 2 SPRAYS IN EACH NOSTRIL ONCE DAILY. SHAKE GENTLY BEFORE USING., Disp: 48 g, Rfl: 1   hydrALAZINE (APRESOLINE) 100 MG tablet, Take one tablet by mouth once daily, Disp: 90 tablet, Rfl: 3   latanoprost (XALATAN) 0.005 % ophthalmic solution, Place 1 drop into both eyes at bedtime., Disp: , Rfl:    levothyroxine (SYNTHROID) 100 MCG tablet, Take 1 tablet (100 mcg total) by mouth daily., Disp: 90 tablet, Rfl: 1   metoprolol succinate (TOPROL-XL) 50 MG 24 hr tablet, Take 1 tablet (50 mg total) by mouth daily. Take with or immediately following a meal., Disp: 90 tablet, Rfl: 1   nitroGLYCERIN (NITROSTAT) 0.4 MG SL tablet, DISSOLVE 1 TABLET UNDER THE  TONGUE EVERY 5 MINUTES AS NEEDED FOR CHEST PAIN. MAX OF 3 TABLETS IN 15 MINUTES. CALL 911 IF PAIN  PERSISTS., Disp: 100 tablet, Rfl: 2   Polyvinyl Alcohol-Povidone (REFRESH OP), Place 1 drop into both eyes at bedtime., Disp: , Rfl:    rosuvastatin (CRESTOR) 10 MG tablet, Take 1 tablet (10 mg total) by mouth daily., Disp: 90 tablet, Rfl: 3   SYSTANE ULTRA 0.4-0.3 % SOLN, Place 1 drop into both eyes daily as needed (dry eyes)., Disp: , Rfl:    telmisartan (MICARDIS) 20 MG tablet, Take 0.5 tablets (10 mg total) by  mouth daily., Disp: 15 tablet, Rfl: 11   trolamine salicylate (ASPERCREME) 10 % cream, Apply 1 application topically as needed for muscle pain., Disp: , Rfl:    UNABLE TO FIND, Shower chair x 1  DX M54.36, Disp: 1 each, Rfl: 0   UNABLE TO FIND, Tdap x 1 fax vaccine record to [phone], Disp: 1 each, Rfl: 0   Allergies: Allergies  Allergen Reactions   Tramadol Itching   Lisinopril Cough   Shrimp (Diagnostic)     Gout flares   Clonidine Derivatives Other  (See Comments)    Zombie BP dropped    REVIEW OF SYSTEMS:   Review of Systems  Constitutional:  Negative for chills, fatigue and fever.  HENT:   Negative for lump/mass, mouth sores, nosebleeds, sore throat and trouble swallowing.   Eyes:  Negative for eye problems.  Respiratory:  Negative for cough and shortness of breath.   Cardiovascular:  Positive for palpitations. Negative for chest pain and leg swelling.  Gastrointestinal:  Negative for abdominal pain, constipation, diarrhea, nausea and vomiting.  Genitourinary:  Negative for bladder incontinence, difficulty urinating, dysuria, frequency, hematuria and nocturia.   Musculoskeletal:  Negative for arthralgias, back pain, flank pain, myalgias and neck pain.  Skin:  Negative for itching and rash.  Neurological:  Positive for numbness. Negative for dizziness and headaches.  Hematological:  Does not bruise/bleed easily.  Psychiatric/Behavioral:  Negative for depression, sleep disturbance and suicidal ideas. The patient is not nervous/anxious.   All other systems reviewed and are negative.    VITALS:   There were no vitals taken for this visit.  Wt Readings from Last 3 Encounters:  01/21/23 150 lb 6.4 oz (68.2 kg)  01/15/23 150 lb 0.6 oz (68.1 kg)  01/09/23 150 lb (68 kg)    There is no height or weight on file to calculate BMI.  Performance status (ECOG): 1 - Symptomatic but completely ambulatory  PHYSICAL EXAM:   Physical Exam Vitals and nursing note reviewed. Exam conducted with a chaperone present.  Constitutional:      Appearance: Normal appearance.  Cardiovascular:     Rate and Rhythm: Normal rate and regular rhythm.     Pulses: Normal pulses.     Heart sounds: Normal heart sounds.  Pulmonary:     Effort: Pulmonary effort is normal.     Breath sounds: Normal breath sounds.  Abdominal:     Palpations: Abdomen is soft. There is no hepatomegaly, splenomegaly or mass.     Tenderness: There is no abdominal tenderness.   Musculoskeletal:     Right lower leg: No edema.     Left lower leg: No edema.  Lymphadenopathy:     Cervical: No cervical adenopathy.     Right cervical: No superficial, deep or posterior cervical adenopathy.    Left cervical: No superficial, deep or posterior cervical adenopathy.     Upper Body:     Right upper body: No supraclavicular or axillary adenopathy.     Left upper body: No supraclavicular or axillary adenopathy.  Neurological:     General: No focal deficit present.     Mental Status: She is alert and oriented to person, place, and time.  Psychiatric:        Mood and Affect: Mood normal.        Behavior: Behavior normal.     LABS:      Latest Ref Rng & Units 01/28/2023   11:20 AM 01/15/2023    4:13 PM 01/09/2023  9:14 PM  CBC  WBC 4.0 - 10.5 K/uL 5.5  5.6    Hemoglobin 12.0 - 15.0 g/dL 7.2  8.2  8.5   Hematocrit 36.0 - 46.0 % 23.8  25.5  26.8   Platelets 150 - 400 K/uL 242  196        Latest Ref Rng & Units 01/15/2023    4:13 PM 01/09/2023    2:47 PM 12/12/2022    2:00 PM  CMP  Glucose 70 - 99 mg/dL 161  096  99   BUN 8 - 27 mg/dL 31  36  25   Creatinine 0.57 - 1.00 mg/dL 0.45  4.09  8.11   Sodium 134 - 144 mmol/L 140  137  138   Potassium 3.5 - 5.2 mmol/L 5.1  4.5  4.4   Chloride 96 - 106 mmol/L 107  109  109   CO2 20 - 29 mmol/L 19  19  19    Calcium 8.7 - 10.3 mg/dL 9.9  91.4  78.2   Total Protein 6.0 - 8.5 g/dL 6.6   7.5   Total Bilirubin 0.0 - 1.2 mg/dL 0.2   0.4   Alkaline Phos 44 - 121 IU/L 84   73   AST 0 - 40 IU/L 18   21   ALT 0 - 32 IU/L 11   16      No results found for: "CEA1", "CEA" / No results found for: "CEA1", "CEA" No results found for: "PSA1" No results found for: "CAN199" No results found for: "CAN125"  Lab Results  Component Value Date   TOTALPROTELP 7.2 11/16/2022   TOTALPROTELP 6.9 11/16/2022   ALBUMINELP 3.5 11/16/2022   A1GS 0.3 11/16/2022   A2GS 0.7 11/16/2022   BETS 1.0 11/16/2022   GAMS 1.4 11/16/2022   MSPIKE Not  Observed 11/16/2022   SPEI Comment 11/16/2022   Lab Results  Component Value Date   TIBC 405 11/16/2022   TIBC 416 06/06/2022   TIBC 408 10/24/2021   FERRITIN 18 11/16/2022   FERRITIN 13 06/06/2022   FERRITIN 18 10/24/2021   IRONPCTSAT 72 (H) 11/16/2022   IRONPCTSAT 32 (H) 06/06/2022   IRONPCTSAT 40 (H) 10/24/2021   Lab Results  Component Value Date   LDH 152 11/16/2022     STUDIES:   MM 3D SCREENING MAMMOGRAM BILATERAL BREAST  Result Date: 01/29/2023 CLINICAL DATA:  Screening. EXAM: DIGITAL SCREENING BILATERAL MAMMOGRAM WITH TOMOSYNTHESIS AND CAD TECHNIQUE: Bilateral screening digital craniocaudal and mediolateral oblique mammograms were obtained. Bilateral screening digital breast tomosynthesis was performed. The images were evaluated with computer-aided detection. COMPARISON:  Previous exam(s). ACR Breast Density Category c: The breasts are heterogeneously dense, which may obscure small masses. FINDINGS: There are no findings suspicious for malignancy. IMPRESSION: No mammographic evidence of malignancy. A result letter of this screening mammogram will be mailed directly to the patient. RECOMMENDATION: Screening mammogram in one year. (Code:SM-B-01Y) BI-RADS CATEGORY  1: Negative. Electronically Signed   By: Sherian Rein M.D.   On: 01/29/2023 11:19

## 2023-02-18 ENCOUNTER — Other Ambulatory Visit: Payer: Self-pay

## 2023-02-18 DIAGNOSIS — D631 Anemia in chronic kidney disease: Secondary | ICD-10-CM

## 2023-02-19 ENCOUNTER — Inpatient Hospital Stay: Payer: Medicare Other

## 2023-02-19 ENCOUNTER — Other Ambulatory Visit: Payer: Self-pay | Admitting: Family Medicine

## 2023-02-19 VITALS — BP 131/70 | HR 60 | Temp 97.8°F | Resp 16

## 2023-02-19 DIAGNOSIS — N1832 Chronic kidney disease, stage 3b: Secondary | ICD-10-CM

## 2023-02-19 DIAGNOSIS — Z803 Family history of malignant neoplasm of breast: Secondary | ICD-10-CM | POA: Diagnosis not present

## 2023-02-19 DIAGNOSIS — Z833 Family history of diabetes mellitus: Secondary | ICD-10-CM | POA: Diagnosis not present

## 2023-02-19 DIAGNOSIS — D500 Iron deficiency anemia secondary to blood loss (chronic): Secondary | ICD-10-CM | POA: Diagnosis not present

## 2023-02-19 DIAGNOSIS — Z7982 Long term (current) use of aspirin: Secondary | ICD-10-CM | POA: Diagnosis not present

## 2023-02-19 DIAGNOSIS — Z888 Allergy status to other drugs, medicaments and biological substances status: Secondary | ICD-10-CM | POA: Diagnosis not present

## 2023-02-19 DIAGNOSIS — I129 Hypertensive chronic kidney disease with stage 1 through stage 4 chronic kidney disease, or unspecified chronic kidney disease: Secondary | ICD-10-CM | POA: Diagnosis not present

## 2023-02-19 DIAGNOSIS — I4891 Unspecified atrial fibrillation: Secondary | ICD-10-CM | POA: Diagnosis not present

## 2023-02-19 DIAGNOSIS — Z885 Allergy status to narcotic agent status: Secondary | ICD-10-CM | POA: Diagnosis not present

## 2023-02-19 DIAGNOSIS — E039 Hypothyroidism, unspecified: Secondary | ICD-10-CM | POA: Diagnosis not present

## 2023-02-19 DIAGNOSIS — R5383 Other fatigue: Secondary | ICD-10-CM | POA: Diagnosis not present

## 2023-02-19 DIAGNOSIS — Z800 Family history of malignant neoplasm of digestive organs: Secondary | ICD-10-CM | POA: Diagnosis not present

## 2023-02-19 DIAGNOSIS — C7A010 Malignant carcinoid tumor of the duodenum: Secondary | ICD-10-CM | POA: Diagnosis not present

## 2023-02-19 DIAGNOSIS — K922 Gastrointestinal hemorrhage, unspecified: Secondary | ICD-10-CM | POA: Diagnosis not present

## 2023-02-19 DIAGNOSIS — Z8379 Family history of other diseases of the digestive system: Secondary | ICD-10-CM | POA: Diagnosis not present

## 2023-02-19 DIAGNOSIS — R002 Palpitations: Secondary | ICD-10-CM | POA: Diagnosis not present

## 2023-02-19 DIAGNOSIS — Z87891 Personal history of nicotine dependence: Secondary | ICD-10-CM | POA: Diagnosis not present

## 2023-02-19 DIAGNOSIS — Z79899 Other long term (current) drug therapy: Secondary | ICD-10-CM | POA: Diagnosis not present

## 2023-02-19 DIAGNOSIS — Z7901 Long term (current) use of anticoagulants: Secondary | ICD-10-CM | POA: Diagnosis not present

## 2023-02-19 DIAGNOSIS — Z841 Family history of disorders of kidney and ureter: Secondary | ICD-10-CM | POA: Diagnosis not present

## 2023-02-19 DIAGNOSIS — E785 Hyperlipidemia, unspecified: Secondary | ICD-10-CM | POA: Diagnosis not present

## 2023-02-19 DIAGNOSIS — D472 Monoclonal gammopathy: Secondary | ICD-10-CM | POA: Diagnosis not present

## 2023-02-19 LAB — CBC
HCT: 30 % — ABNORMAL LOW (ref 36.0–46.0)
Hemoglobin: 9.3 g/dL — ABNORMAL LOW (ref 12.0–15.0)
MCH: 30.6 pg (ref 26.0–34.0)
MCHC: 31 g/dL (ref 30.0–36.0)
MCV: 98.7 fL (ref 80.0–100.0)
Platelets: 192 10*3/uL (ref 150–400)
RBC: 3.04 MIL/uL — ABNORMAL LOW (ref 3.87–5.11)
RDW: 13.3 % (ref 11.5–15.5)
WBC: 5.3 10*3/uL (ref 4.0–10.5)
nRBC: 0 % (ref 0.0–0.2)

## 2023-02-19 LAB — SAMPLE TO BLOOD BANK

## 2023-02-19 MED ORDER — EPOETIN ALFA-EPBX 10000 UNIT/ML IJ SOLN
10000.0000 [IU] | Freq: Once | INTRAMUSCULAR | Status: AC
  Administered 2023-02-19: 10000 [IU] via SUBCUTANEOUS
  Filled 2023-02-19: qty 1

## 2023-02-19 MED ORDER — AMLODIPINE BESYLATE 5 MG PO TABS: ORAL_TABLET | ORAL | 3 refills | Status: DC

## 2023-02-19 NOTE — Progress Notes (Signed)
Patient tolerated injection with no complaints voiced.  Site clean and dry with no bruising or swelling noted at site.  See MAR for details.  Band aid applied.  Patient stable during and after injection.  Vss with discharge and left in satisfactory condition with no s/s of distress noted.  

## 2023-02-19 NOTE — Patient Instructions (Signed)

## 2023-02-22 ENCOUNTER — Inpatient Hospital Stay: Payer: Medicare Other

## 2023-02-22 VITALS — BP 130/63 | HR 57 | Temp 97.7°F | Resp 17

## 2023-02-22 DIAGNOSIS — R5383 Other fatigue: Secondary | ICD-10-CM | POA: Diagnosis not present

## 2023-02-22 DIAGNOSIS — I4891 Unspecified atrial fibrillation: Secondary | ICD-10-CM | POA: Diagnosis not present

## 2023-02-22 DIAGNOSIS — Z885 Allergy status to narcotic agent status: Secondary | ICD-10-CM | POA: Diagnosis not present

## 2023-02-22 DIAGNOSIS — Z7982 Long term (current) use of aspirin: Secondary | ICD-10-CM | POA: Diagnosis not present

## 2023-02-22 DIAGNOSIS — C7A010 Malignant carcinoid tumor of the duodenum: Secondary | ICD-10-CM | POA: Diagnosis not present

## 2023-02-22 DIAGNOSIS — D500 Iron deficiency anemia secondary to blood loss (chronic): Secondary | ICD-10-CM | POA: Diagnosis not present

## 2023-02-22 DIAGNOSIS — Z841 Family history of disorders of kidney and ureter: Secondary | ICD-10-CM | POA: Diagnosis not present

## 2023-02-22 DIAGNOSIS — K922 Gastrointestinal hemorrhage, unspecified: Secondary | ICD-10-CM | POA: Diagnosis not present

## 2023-02-22 DIAGNOSIS — I129 Hypertensive chronic kidney disease with stage 1 through stage 4 chronic kidney disease, or unspecified chronic kidney disease: Secondary | ICD-10-CM | POA: Diagnosis not present

## 2023-02-22 DIAGNOSIS — E039 Hypothyroidism, unspecified: Secondary | ICD-10-CM | POA: Diagnosis not present

## 2023-02-22 DIAGNOSIS — Z800 Family history of malignant neoplasm of digestive organs: Secondary | ICD-10-CM | POA: Diagnosis not present

## 2023-02-22 DIAGNOSIS — Z803 Family history of malignant neoplasm of breast: Secondary | ICD-10-CM | POA: Diagnosis not present

## 2023-02-22 DIAGNOSIS — R002 Palpitations: Secondary | ICD-10-CM | POA: Diagnosis not present

## 2023-02-22 DIAGNOSIS — Z8379 Family history of other diseases of the digestive system: Secondary | ICD-10-CM | POA: Diagnosis not present

## 2023-02-22 DIAGNOSIS — Z888 Allergy status to other drugs, medicaments and biological substances status: Secondary | ICD-10-CM | POA: Diagnosis not present

## 2023-02-22 DIAGNOSIS — D631 Anemia in chronic kidney disease: Secondary | ICD-10-CM

## 2023-02-22 DIAGNOSIS — Z7901 Long term (current) use of anticoagulants: Secondary | ICD-10-CM | POA: Diagnosis not present

## 2023-02-22 DIAGNOSIS — E785 Hyperlipidemia, unspecified: Secondary | ICD-10-CM | POA: Diagnosis not present

## 2023-02-22 DIAGNOSIS — D472 Monoclonal gammopathy: Secondary | ICD-10-CM | POA: Diagnosis not present

## 2023-02-22 DIAGNOSIS — Z87891 Personal history of nicotine dependence: Secondary | ICD-10-CM | POA: Diagnosis not present

## 2023-02-22 DIAGNOSIS — N1832 Chronic kidney disease, stage 3b: Secondary | ICD-10-CM | POA: Diagnosis not present

## 2023-02-22 DIAGNOSIS — Z79899 Other long term (current) drug therapy: Secondary | ICD-10-CM | POA: Diagnosis not present

## 2023-02-22 DIAGNOSIS — Z833 Family history of diabetes mellitus: Secondary | ICD-10-CM | POA: Diagnosis not present

## 2023-02-22 MED ORDER — SODIUM CHLORIDE 0.9 % IV SOLN: Freq: Once | INTRAVENOUS | Status: AC

## 2023-02-22 MED ORDER — ACETAMINOPHEN 325 MG PO TABS
650.0000 mg | ORAL_TABLET | Freq: Once | ORAL | Status: AC
  Administered 2023-02-22: 650 mg via ORAL
  Filled 2023-02-22: qty 2

## 2023-02-22 MED ORDER — SODIUM CHLORIDE 0.9 % IV SOLN
510.0000 mg | Freq: Once | INTRAVENOUS | Status: AC
  Administered 2023-02-22: 510 mg via INTRAVENOUS
  Filled 2023-02-22: qty 510

## 2023-02-22 NOTE — Patient Instructions (Signed)

## 2023-02-22 NOTE — Progress Notes (Signed)
Patient presents today for iron infusion.  Patient is in satisfactory condition with no new complaints voiced.  Vital signs are stable.  IV placed in right AC.  IV flushed well with good blood return noted.    Patient had Zyrtec 10 mg this morning prior to visit.  We will hold that pre-med today.   We will proceed with infusion per provider orders.

## 2023-02-22 NOTE — Progress Notes (Signed)
Patient tolerated iron infusion with no complaints voiced.  Peripheral IV site clean and dry with good blood return noted before and after infusion.  Band aid applied.  VSS with discharge and left in satisfactory condition with no s/s of distress noted.   

## 2023-03-01 ENCOUNTER — Inpatient Hospital Stay: Payer: Medicare Other

## 2023-03-01 VITALS — BP 117/59 | HR 81 | Temp 97.0°F | Resp 18

## 2023-03-01 DIAGNOSIS — R002 Palpitations: Secondary | ICD-10-CM | POA: Diagnosis not present

## 2023-03-01 DIAGNOSIS — Z7901 Long term (current) use of anticoagulants: Secondary | ICD-10-CM | POA: Diagnosis not present

## 2023-03-01 DIAGNOSIS — Z885 Allergy status to narcotic agent status: Secondary | ICD-10-CM | POA: Diagnosis not present

## 2023-03-01 DIAGNOSIS — C7A010 Malignant carcinoid tumor of the duodenum: Secondary | ICD-10-CM | POA: Diagnosis not present

## 2023-03-01 DIAGNOSIS — Z833 Family history of diabetes mellitus: Secondary | ICD-10-CM | POA: Diagnosis not present

## 2023-03-01 DIAGNOSIS — D500 Iron deficiency anemia secondary to blood loss (chronic): Secondary | ICD-10-CM

## 2023-03-01 DIAGNOSIS — Z8379 Family history of other diseases of the digestive system: Secondary | ICD-10-CM | POA: Diagnosis not present

## 2023-03-01 DIAGNOSIS — Z841 Family history of disorders of kidney and ureter: Secondary | ICD-10-CM | POA: Diagnosis not present

## 2023-03-01 DIAGNOSIS — K922 Gastrointestinal hemorrhage, unspecified: Secondary | ICD-10-CM | POA: Diagnosis not present

## 2023-03-01 DIAGNOSIS — D631 Anemia in chronic kidney disease: Secondary | ICD-10-CM

## 2023-03-01 DIAGNOSIS — E039 Hypothyroidism, unspecified: Secondary | ICD-10-CM | POA: Diagnosis not present

## 2023-03-01 DIAGNOSIS — D472 Monoclonal gammopathy: Secondary | ICD-10-CM | POA: Diagnosis not present

## 2023-03-01 DIAGNOSIS — R5383 Other fatigue: Secondary | ICD-10-CM | POA: Diagnosis not present

## 2023-03-01 DIAGNOSIS — I129 Hypertensive chronic kidney disease with stage 1 through stage 4 chronic kidney disease, or unspecified chronic kidney disease: Secondary | ICD-10-CM | POA: Diagnosis not present

## 2023-03-01 DIAGNOSIS — Z87891 Personal history of nicotine dependence: Secondary | ICD-10-CM | POA: Diagnosis not present

## 2023-03-01 DIAGNOSIS — Z803 Family history of malignant neoplasm of breast: Secondary | ICD-10-CM | POA: Diagnosis not present

## 2023-03-01 DIAGNOSIS — Z7982 Long term (current) use of aspirin: Secondary | ICD-10-CM | POA: Diagnosis not present

## 2023-03-01 DIAGNOSIS — Z888 Allergy status to other drugs, medicaments and biological substances status: Secondary | ICD-10-CM | POA: Diagnosis not present

## 2023-03-01 DIAGNOSIS — Z79899 Other long term (current) drug therapy: Secondary | ICD-10-CM | POA: Diagnosis not present

## 2023-03-01 DIAGNOSIS — I4891 Unspecified atrial fibrillation: Secondary | ICD-10-CM | POA: Diagnosis not present

## 2023-03-01 DIAGNOSIS — N1832 Chronic kidney disease, stage 3b: Secondary | ICD-10-CM | POA: Diagnosis not present

## 2023-03-01 DIAGNOSIS — Z800 Family history of malignant neoplasm of digestive organs: Secondary | ICD-10-CM | POA: Diagnosis not present

## 2023-03-01 DIAGNOSIS — E785 Hyperlipidemia, unspecified: Secondary | ICD-10-CM | POA: Diagnosis not present

## 2023-03-01 MED ORDER — SODIUM CHLORIDE 0.9 % IV SOLN
510.0000 mg | Freq: Once | INTRAVENOUS | Status: AC
  Administered 2023-03-01: 510 mg via INTRAVENOUS
  Filled 2023-03-01: qty 510

## 2023-03-01 MED ORDER — SODIUM CHLORIDE 0.9 % IV SOLN: Freq: Once | INTRAVENOUS | Status: AC

## 2023-03-01 MED ORDER — ACETAMINOPHEN 325 MG PO TABS
650.0000 mg | ORAL_TABLET | Freq: Once | ORAL | Status: AC
  Administered 2023-03-01: 650 mg via ORAL
  Filled 2023-03-01: qty 2

## 2023-03-01 NOTE — Patient Instructions (Signed)
[facility]  Discharge Instructions: Thank you for choosing [facility] to provide your oncology and hematology care.  If you have a lab appointment with the Cancer Center - please note that after April 8th, 2024, all labs will be drawn in the cancer center.  You do not have to check in or register with the main entrance as you have in the past but will complete your check-in in the cancer center.  Wear comfortable clothing and clothing appropriate for easy access to any Portacath or PICC line.   We strive to give you quality time with your provider. You may need to reschedule your appointment if you arrive late (15 or more minutes).  Arriving late affects you and other patients whose appointments are after yours.  Also, if you miss three or more appointments without notifying the office, you may be dismissed from the clinic at the provider's discretion.      For prescription refill requests, have your pharmacy contact our office and allow 72 hours for refills to be completed.    Today you received the following Feraheme Iron infusion   To help prevent nausea and vomiting after your treatment, we encourage you to take your nausea medication as directed.  BELOW ARE SYMPTOMS THAT SHOULD BE REPORTED IMMEDIATELY: *FEVER GREATER THAN 100.4 F (38 C) OR HIGHER *CHILLS OR SWEATING *NAUSEA AND VOMITING THAT IS NOT CONTROLLED WITH YOUR NAUSEA MEDICATION *UNUSUAL SHORTNESS OF BREATH *UNUSUAL BRUISING OR BLEEDING *URINARY PROBLEMS (pain or burning when urinating, or frequent urination) *BOWEL PROBLEMS (unusual diarrhea, constipation, pain near the anus) TENDERNESS IN MOUTH AND THROAT WITH OR WITHOUT PRESENCE OF ULCERS (sore throat, sores in mouth, or a toothache) UNUSUAL RASH, SWELLING OR PAIN  UNUSUAL VAGINAL DISCHARGE OR ITCHING   Items with * indicate a potential emergency and should be followed up as soon as possible or go to the Emergency Department if any  problems should occur.  Please show the CHEMOTHERAPY ALERT CARD or IMMUNOTHERAPY ALERT CARD at check-in to the Emergency Department and triage nurse.  Should you have questions after your visit or need to cancel or reschedule your appointment, please contact [facility] [phone]  and follow the prompts.  Office hours are 8:00 a.m. to 4:30 p.m. Monday - Friday. Please note that voicemails left after 4:00 p.m. may not be returned until the following business day.  We are closed weekends and major holidays. You have access to a nurse at all times for urgent questions. Please call the main number to the clinic [phone] and follow the prompts.  For any non-urgent questions, you may also contact your provider using MyChart. We now offer e-Visits for anyone 85 and older to request care online for non-urgent symptoms. For details visit [email or web address].   Also download the MyChart app! Go to the app store, search "MyChart", open the app, select [facility], and log in with your MyChart username and password.

## 2023-03-01 NOTE — Progress Notes (Signed)
Pt took her allergy pill at home today.  Feraheme infusion given per orders. Patient tolerated it well without problems. Vitals stable and discharged home from clinic via wheelchair. Follow up as scheduled.

## 2023-03-06 ENCOUNTER — Other Ambulatory Visit: Payer: Self-pay

## 2023-03-06 DIAGNOSIS — D500 Iron deficiency anemia secondary to blood loss (chronic): Secondary | ICD-10-CM

## 2023-03-06 DIAGNOSIS — D631 Anemia in chronic kidney disease: Secondary | ICD-10-CM

## 2023-03-07 ENCOUNTER — Inpatient Hospital Stay: Payer: Medicare Other

## 2023-03-07 ENCOUNTER — Inpatient Hospital Stay: Payer: Medicare Other | Attending: Hematology | Admitting: Oncology

## 2023-03-07 ENCOUNTER — Encounter: Payer: Self-pay | Admitting: Hematology

## 2023-03-07 VITALS — BP 129/83 | HR 60 | Temp 97.9°F | Resp 17 | Ht 61.0 in | Wt 150.6 lb

## 2023-03-07 DIAGNOSIS — N1832 Chronic kidney disease, stage 3b: Secondary | ICD-10-CM | POA: Insufficient documentation

## 2023-03-07 DIAGNOSIS — Z7901 Long term (current) use of anticoagulants: Secondary | ICD-10-CM | POA: Diagnosis not present

## 2023-03-07 DIAGNOSIS — D509 Iron deficiency anemia, unspecified: Secondary | ICD-10-CM | POA: Diagnosis not present

## 2023-03-07 DIAGNOSIS — D500 Iron deficiency anemia secondary to blood loss (chronic): Secondary | ICD-10-CM

## 2023-03-07 DIAGNOSIS — E039 Hypothyroidism, unspecified: Secondary | ICD-10-CM | POA: Insufficient documentation

## 2023-03-07 DIAGNOSIS — K5900 Constipation, unspecified: Secondary | ICD-10-CM | POA: Insufficient documentation

## 2023-03-07 DIAGNOSIS — G8929 Other chronic pain: Secondary | ICD-10-CM | POA: Diagnosis not present

## 2023-03-07 DIAGNOSIS — Z87891 Personal history of nicotine dependence: Secondary | ICD-10-CM | POA: Insufficient documentation

## 2023-03-07 DIAGNOSIS — D472 Monoclonal gammopathy: Secondary | ICD-10-CM | POA: Insufficient documentation

## 2023-03-07 DIAGNOSIS — Z8249 Family history of ischemic heart disease and other diseases of the circulatory system: Secondary | ICD-10-CM | POA: Insufficient documentation

## 2023-03-07 DIAGNOSIS — Z888 Allergy status to other drugs, medicaments and biological substances status: Secondary | ICD-10-CM | POA: Diagnosis not present

## 2023-03-07 DIAGNOSIS — Z841 Family history of disorders of kidney and ureter: Secondary | ICD-10-CM | POA: Insufficient documentation

## 2023-03-07 DIAGNOSIS — D3A010 Benign carcinoid tumor of the duodenum: Secondary | ICD-10-CM

## 2023-03-07 DIAGNOSIS — Z885 Allergy status to narcotic agent status: Secondary | ICD-10-CM | POA: Insufficient documentation

## 2023-03-07 DIAGNOSIS — Z79899 Other long term (current) drug therapy: Secondary | ICD-10-CM | POA: Diagnosis not present

## 2023-03-07 DIAGNOSIS — Z803 Family history of malignant neoplasm of breast: Secondary | ICD-10-CM | POA: Insufficient documentation

## 2023-03-07 DIAGNOSIS — Z825 Family history of asthma and other chronic lower respiratory diseases: Secondary | ICD-10-CM | POA: Insufficient documentation

## 2023-03-07 DIAGNOSIS — D631 Anemia in chronic kidney disease: Secondary | ICD-10-CM

## 2023-03-07 DIAGNOSIS — E669 Obesity, unspecified: Secondary | ICD-10-CM | POA: Insufficient documentation

## 2023-03-07 DIAGNOSIS — K922 Gastrointestinal hemorrhage, unspecified: Secondary | ICD-10-CM | POA: Diagnosis not present

## 2023-03-07 DIAGNOSIS — E785 Hyperlipidemia, unspecified: Secondary | ICD-10-CM | POA: Insufficient documentation

## 2023-03-07 DIAGNOSIS — I129 Hypertensive chronic kidney disease with stage 1 through stage 4 chronic kidney disease, or unspecified chronic kidney disease: Secondary | ICD-10-CM | POA: Diagnosis not present

## 2023-03-07 DIAGNOSIS — Z800 Family history of malignant neoplasm of digestive organs: Secondary | ICD-10-CM | POA: Diagnosis not present

## 2023-03-07 DIAGNOSIS — I4891 Unspecified atrial fibrillation: Secondary | ICD-10-CM | POA: Insufficient documentation

## 2023-03-07 DIAGNOSIS — Z8379 Family history of other diseases of the digestive system: Secondary | ICD-10-CM | POA: Diagnosis not present

## 2023-03-07 DIAGNOSIS — I2510 Atherosclerotic heart disease of native coronary artery without angina pectoris: Secondary | ICD-10-CM | POA: Insufficient documentation

## 2023-03-07 DIAGNOSIS — C7A010 Malignant carcinoid tumor of the duodenum: Secondary | ICD-10-CM | POA: Diagnosis not present

## 2023-03-07 DIAGNOSIS — Z833 Family history of diabetes mellitus: Secondary | ICD-10-CM | POA: Diagnosis not present

## 2023-03-07 LAB — CBC WITH DIFFERENTIAL/PLATELET
Abs Immature Granulocytes: 0.02 10*3/uL (ref 0.00–0.07)
Basophils Absolute: 0 10*3/uL (ref 0.0–0.1)
Basophils Relative: 1 %
Eosinophils Absolute: 0.3 10*3/uL (ref 0.0–0.5)
Eosinophils Relative: 7 %
HCT: 29.9 % — ABNORMAL LOW (ref 36.0–46.0)
Hemoglobin: 9.4 g/dL — ABNORMAL LOW (ref 12.0–15.0)
Immature Granulocytes: 0 %
Lymphocytes Relative: 17 %
Lymphs Abs: 0.8 10*3/uL (ref 0.7–4.0)
MCH: 31 pg (ref 26.0–34.0)
MCHC: 31.4 g/dL (ref 30.0–36.0)
MCV: 98.7 fL (ref 80.0–100.0)
Monocytes Absolute: 0.5 10*3/uL (ref 0.1–1.0)
Monocytes Relative: 9 %
Neutro Abs: 3.2 10*3/uL (ref 1.7–7.7)
Neutrophils Relative %: 66 %
Platelets: 159 10*3/uL (ref 150–400)
RBC: 3.03 MIL/uL — ABNORMAL LOW (ref 3.87–5.11)
RDW: 13.8 % (ref 11.5–15.5)
WBC: 4.9 10*3/uL (ref 4.0–10.5)
nRBC: 0 % (ref 0.0–0.2)

## 2023-03-07 LAB — FERRITIN: Ferritin: 369 ng/mL — ABNORMAL HIGH (ref 11–307)

## 2023-03-07 LAB — IRON AND TIBC
Iron: 113 ug/dL (ref 28–170)
Saturation Ratios: 36 % — ABNORMAL HIGH (ref 10.4–31.8)
TIBC: 313 ug/dL (ref 250–450)
UIBC: 200 ug/dL

## 2023-03-07 LAB — SAMPLE TO BLOOD BANK

## 2023-03-07 MED ORDER — EPOETIN ALFA-EPBX 10000 UNIT/ML IJ SOLN
10000.0000 [IU] | Freq: Once | INTRAMUSCULAR | Status: AC
  Administered 2023-03-07: 10000 [IU] via SUBCUTANEOUS
  Filled 2023-03-07: qty 1

## 2023-03-07 NOTE — Progress Notes (Signed)
Patient tolerated injection with no complaints voiced.  Site clean and dry with no bruising or swelling noted at site.  See MAR for details.  Band aid applied.  Patient stable during and after injection.  Vss with discharge and left in satisfactory condition with no s/s of distress noted.  

## 2023-03-07 NOTE — Progress Notes (Signed)
[facility] [address], [place] [address]    Clinic Day:  03/07/2023  Referring physician: Kerri Perches, MD  Patient Care Team: Kerri Perches, MD as PCP - General (Family Medicine) Jonelle Sidle, MD as PCP - Cardiology (Cardiology) Luis Abed, MD as Consulting Physician (Cardiology) Sallye Lat, MD as Consulting Physician (Optometry) Adam Phenix, DPM as Consulting Physician (Podiatry) Sharlene Dory, NP as Nurse Practitioner (Cardiology) Doreatha Massed, MD as Medical Oncologist (Hematology and Oncology)   ASSESSMENT & PLAN:   Assessment: 1.  Duodenal neuroendocrine tumor: - 04/27/2021 (duodenal mass biopsy): Well-differentiated carcinoid tumor, grade 1, Ki-67 1%. - Dotatate PET (05/2021): No focal activity within the duodenum or evidence of metastatic disease. - EGD/EUS (08/2021): Submucosal nodule found in the duodenum-consistent with previously biopsied duodenal NET.  An intramural subepithelial lesion found in the second part of the duodenum.  Lesion appears to originate from within the deep mucosa and submucosa.  She was not felt to be candidate for EMR. - Patient had traumatic experience after the EUS and did not want to pursue further options and declined a referral to [facility]. - Denies any diarrhea/flushing/wheezing/abdominal pain.  No obstructive symptoms.   2.  Normocytic anemia: - More than 5-year history of iron deficiency anemia secondary to GI bleed.  She underwent multiple EGDs and colonoscopies and 2 small bowel capsule endoscopies. - She reports taking iron tablet daily for the past 3 years.  Denies any ice pica. - Denies any BRBPR/melena.  She had blood transfusion 01/09/2023.   3.  Social/family history: - Lives at home with her husband.  Independent of ADLs and IADLs.  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  No chemical exposure.  Smoked half pack per day starting  at age 10 and quit in 2005. - Brother had colon cancer.  Older brother had liver cancer.    Plan: 1.  Normocytic anemia: - Multifactorial anemia from CKD, iron deficiency and chronic blood loss.  -Was placed on Eliquis by Dr. Diona Browner cardiology for atrial fibrillation.  Given frequent blood transfusions, she was taken off of Eliquis as of 01/21/2023 and put on 81 mg aspirin only.  Has not noticed any bleeding. - Status post 1 unit PRBC on 12/12/2022 for hemoglobin 7.6 on 01/30/23 for hemoglobin of 7.2.  - Labs from 03/07/2023 show hemoglobin of 9.4.  Iron levels are pending. -She received  IV Feraheme on 12/18/2022, 12/28/2022, 02/22/2023 and 03/01/2023. -She takes 325 mg of oral iron daily with mild constipation -She continues Retacrit every 2 weeks for hemoglobin less then 11.  -Recommend Retacrit 10,000 units today. -We discussed increasing Retacrit should her iron levels be stable to possibly decrease the frequency of her injections.  Will decide after iron levels returned today.   2.  Duodenal carcinoid tumor: - Chromogranin (11/16/2022): 847 - 24-hour urine 5-HIAA (11/21/2022): Normal - Reviewed dotatate PET scan from 12/06/2022: No evidence of neuroendocrine tumor or metastasis.  3.  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  Kappa light chains of 56, lambda light chains 36 and ratio of 1.55.  Marland Kitchen Immunofixation shows IgG kappa monoclonal protein.  Calcium is normal and her creatinine is at baseline.  We will closely monitor at this time.   PLAN SUMMARY: >> Continue biweekly 10,000 units Retacrit for hemoglobin less then 11. >> Discussed increasing Retacrit should ferritin levels improve to decrease frequency  of visits. >> Continue oral iron tablets. >> Recommend follow-up with lab work in 12 weeks, possible Retacrit and see a provider.     No orders of the defined types were placed in this encounter.  I spent 20 minutes dedicated to the care of  this patient (face-to-face and non-face-to-face) on the date of the encounter to include what is described in the assessment and plan.   Mauro Kaufmann, NP   9/5/20249:59 AM  CHIEF COMPLAINT:   Diagnosis: history of NET and anemia    Cancer Staging  No matching staging information was found for the patient.    Prior Therapy: none  Current Therapy:   retacrit injections, biweekly   HISTORY OF PRESENT ILLNESS:   Oncology History   No history exists.     INTERVAL HISTORY:   Shanessa is a 82 y.o. female presenting to clinic today for follow up of history of NET and anemia. She was last seen in clinic on 02/04/2023 for follow-up.  She has been receiving Retacrit every few weeks since 01/04/2021.  She also receives IV iron intermittently.  Retacrit has increased from 3000 units to currently 10,000 units with improvement of her hemoglobin.  She last received IV Feraheme on 02/22/2023 and 03/01/2023.  Today, she reports feeling stable.  Denies any bleeding per rectum, hematochezia, melena, gum bleeding or epistaxis.  Appetite is not 100% energy levels are 75%.  She has chronic right knee pain and has received steroid injections which have helped some.  She uses a cane for ambulation.  She continues to take and tolerate iron supplements.  Has occasional constipation and will intermittently take a stool softener.  Has occasional chest pain secondary to atrial fibrillation.  She was recently taken off Xarelto and placed on aspirin 81 mg due to worsening anemia.    PAST MEDICAL HISTORY:   Past Medical History: Past Medical History:  Diagnosis Date   A-fib (HCC)    Blood transfusion without reported diagnosis    CAD (coronary artery disease)    Multivessel status post CABG October 2005   Carotid artery disease (HCC)    CKD (chronic kidney disease), stage III (HCC)    Essential hypertension    Glaucoma    Laser surgery 2010   Hyperlipidemia    Hypothyroidism    Iron deficiency anemia     Obesity     Surgical History: Past Surgical History:  Procedure Laterality Date   BIOPSY  04/27/2021   Procedure: BIOPSY;  Surgeon: Rachael Fee, MD;  Location: [facility];  Service: Endoscopy;;   COLONOSCOPY N/A 02/04/2013   Procedure: COLONOSCOPY;  Surgeon: Malissa Hippo, MD;  [facility];  Service: Endoscopy;  Laterality: N/A;  1015   COLONOSCOPY N/A 12/21/2016   Procedure: COLONOSCOPY;  Surgeon: Malissa Hippo, MD;  [facility];  Service: Endoscopy;  Laterality: N/A;   COLONOSCOPY WITH PROPOFOL N/A 03/22/2021   Procedure: COLONOSCOPY WITH PROPOFOL;  Surgeon: Malissa Hippo, MD;  [facility];  Service: Endoscopy;  Laterality: N/A;  8:15   CORONARY ARTERY BYPASS GRAFT  October 2005   Dr. Cornelius Moras - LIMA to LAD, SVG to OM 2 and OM 3, SVG to PDA   ESOPHAGOGASTRODUODENOSCOPY N/A 12/21/2016   Procedure: ESOPHAGOGASTRODUODENOSCOPY (EGD);  Surgeon: Malissa Hippo, MD;  [facility];  Service: Endoscopy;  Laterality: N/A;  11:10   ESOPHAGOGASTRODUODENOSCOPY N/A 02/27/2018   Procedure: ESOPHAGOGASTRODUODENOSCOPY (EGD);  Surgeon: Malissa Hippo, MD;  [facility];  Service: Endoscopy;  Laterality: N/A;   ESOPHAGOGASTRODUODENOSCOPY (EGD) WITH PROPOFOL N/A 04/27/2021   Procedure: ESOPHAGOGASTRODUODENOSCOPY (EGD) WITH PROPOFOL;  Surgeon: Rachael Fee, MD;  [facility]: [facility];  Service: Endoscopy;  Laterality: N/A;   ESOPHAGOGASTRODUODENOSCOPY (EGD) WITH PROPOFOL N/A 08/17/2021   Procedure: ESOPHAGOGASTRODUODENOSCOPY (EGD) WITH PROPOFOL;  Surgeon: Meridee Score Netty Starring., MD;  Location: [facility] END[facility]COPY;  Service: Gastroenterology;  Laterality: N/A;   EUS N/A 04/27/2021   Procedure: UPPER ENDOSCOPIC ULTRASOUND (EUS) RADIAL;  Surgeon: Rachael Fee, MD;  [facility]: [facility];  Service: Endoscopy;  Laterality: N/A;   EYE SURGERY  2010   laser treatment to both eyes for glaucoma   GIVENS CAPSULE STUDY N/A 03/07/2018   Procedure:  GIVENS CAPSULE STUDY;  Surgeon: Malissa Hippo, MD;  [facility];  Service: Endoscopy;  Laterality: N/A;   GIVENS CAPSULE STUDY N/A 02/05/2020   Procedure: GIVENS CAPSULE STUDY;  Surgeon: Malissa Hippo, MD;  [facility];  Service: Endoscopy;  Laterality: N/A;  730   POLYPECTOMY  03/22/2021   Procedure: POLYPECTOMY;  Surgeon: Malissa Hippo, MD;  [facility];  Service: Endoscopy;;   UPPER ESOPHAGEAL ENDOSCOPIC ULTRASOUND (EUS) N/A 08/17/2021   Procedure: UPPER ESOPHAGEAL ENDOSCOPIC ULTRASOUND (EUS);  Surgeon: Lemar Lofty., MD;  Location: [facility] ENDOSCOPY;  Service: Gastroenterology;  Laterality: N/A;    Social History: Social History   Socioeconomic History   Marital status: Married    Spouse name: Not on file   Number of children: Not on file   Years of education: Not on file   Highest education level: Not on file  Occupational History   Occupation: retired   Tobacco Use   Smoking status: Former    Current packs/day: 0.00    Average packs/day: 0.3 packs/day for 40.0 years (10.0 ttl pk-yrs)    Types: Cigarettes    Start date: 07/03/1963    Quit date: 07/03/2003    Years since quitting: 19.6    Passive exposure: Never   Smokeless tobacco: Never  Vaping Use   Vaping status: Never Used  Substance and Sexual Activity   Alcohol use: No    Alcohol/week: 0.0 standard drinks of alcohol   Drug use: No   Sexual activity: Never  Other Topics Concern   Not on file  Social History Narrative   Not on file   Social Determinants of Health   Financial Resource Strain: Low Risk  (12/19/2022)   Overall Financial Resource Strain (CARDIA)    Difficulty of Paying Living Expenses: Not hard at all  Food Insecurity: No Food Insecurity (12/19/2022)   Hunger Vital Sign    Worried About Running Out of Food in the Last Year: Never true    Ran Out of Food in the Last Year: Never true  Transportation Needs: No Transportation Needs (12/19/2022)   PRAPARE -  Administrator, Civil Service (Medical): No    Lack of Transportation (Non-Medical): No  Physical Activity: Insufficiently Active (12/19/2022)   Exercise Vital Sign    Days of Exercise per Week: 5 days    Minutes of Exercise per Session: 10 min  Stress: No Stress Concern Present (12/19/2022)   [employer] of Occupational Health - Occupational Stress Questionnaire    Feeling of Stress : Not at all  Social Connections: Socially Integrated (12/19/2022)   Social Connection and Isolation Panel [NHANES]    Frequency of Communication with Friends and Family: More than three times a week  Frequency of Social Gatherings with Friends and Family: More than three times a week    Attends Religious Services: More than 4 times per year    Active Member of Clubs or Organizations: Yes    Attends Banker Meetings: More than 4 times per year    Marital Status: Married  Catering manager Violence: Not At Risk (12/19/2022)   Humiliation, Afraid, Rape, and Kick questionnaire    Fear of Current or Ex-Partner: No    Emotionally Abused: No    Physically Abused: No    Sexually Abused: No    Family History: Family History  Problem Relation Age of Onset   Aneurysm Mother    Hypertension Mother    Coronary artery disease Mother    Coronary artery disease Father    Diabetes Sister    Coronary artery disease Sister    Diverticulosis Sister    COPD Sister    Kidney disease Sister    Diabetes Sister    Hypertension Sister    Breast cancer Sister 46   Hearing loss Sister    Heart Problems Sister        pacemaker   Colon cancer Brother 64       lived 18 months afterwards   Liver cancer Brother 82   Diabetes Son    Pancreatic cancer Son    Esophageal cancer Neg Hx    Inflammatory bowel disease Neg Hx    Stomach cancer Neg Hx     Current Medications:  Current Outpatient Medications:    amLODipine (NORVASC) 5 MG tablet, Take one tablet by mouth two times daily, Disp: 180  tablet, Rfl: 3   aspirin EC 81 MG tablet, Take 1 tablet (81 mg total) by mouth daily. Swallow whole., Disp: 90 tablet, Rfl: 4   cetirizine (ZYRTEC) 10 MG tablet, Take 1 tablet (10 mg total) by mouth daily., Disp: 90 tablet, Rfl: 1   famotidine (PEPCID) 20 MG tablet, TAKE 1 TABLET BY MOUTH TWICE  DAILY, Disp: 120 tablet, Rfl: 5   ferrous sulfate 325 (65 FE) MG tablet, Take 325 mg by mouth daily with breakfast., Disp: , Rfl:    fish oil-omega-3 fatty acids 1000 MG capsule, Take 1 g by mouth daily., Disp: , Rfl:    fluticasone (FLONASE) 50 MCG/ACT nasal spray, USE 2 SPRAYS IN EACH NOSTRIL ONCE DAILY. SHAKE GENTLY BEFORE USING., Disp: 48 g, Rfl: 1   hydrALAZINE (APRESOLINE) 100 MG tablet, Take one tablet by mouth once daily, Disp: 90 tablet, Rfl: 3   latanoprost (XALATAN) 0.005 % ophthalmic solution, Place 1 drop into both eyes at bedtime., Disp: , Rfl:    levothyroxine (SYNTHROID) 100 MCG tablet, Take 1 tablet (100 mcg total) by mouth daily., Disp: 90 tablet, Rfl: 1   metoprolol succinate (TOPROL-XL) 50 MG 24 hr tablet, Take 1 tablet (50 mg total) by mouth daily. Take with or immediately following a meal., Disp: 90 tablet, Rfl: 1   nitroGLYCERIN (NITROSTAT) 0.4 MG SL tablet, DISSOLVE 1 TABLET UNDER THE  TONGUE EVERY 5 MINUTES AS NEEDED FOR CHEST PAIN. MAX OF 3 TABLETS IN 15 MINUTES. CALL 911 IF PAIN  PERSISTS., Disp: 100 tablet, Rfl: 2   Polyvinyl Alcohol-Povidone (REFRESH OP), Place 1 drop into both eyes at bedtime., Disp: , Rfl:    rosuvastatin (CRESTOR) 10 MG tablet, Take 1 tablet (10 mg total) by mouth daily., Disp: 90 tablet, Rfl: 3   SYSTANE ULTRA 0.4-0.3 % SOLN, Place 1 drop into both eyes daily as  needed (dry eyes)., Disp: , Rfl:    telmisartan (MICARDIS) 20 MG tablet, Take 0.5 tablets (10 mg total) by mouth daily., Disp: 15 tablet, Rfl: 11   trolamine salicylate (ASPERCREME) 10 % cream, Apply 1 application topically as needed for muscle pain., Disp: , Rfl:    UNABLE TO FIND, Shower chair x 1   DX M54.36, Disp: 1 each, Rfl: 0   UNABLE TO FIND, Tdap x 1 fax vaccine record to [phone], Disp: 1 each, Rfl: 0   Allergies: Allergies  Allergen Reactions   Tramadol Itching   Lisinopril Cough   Shrimp (Diagnostic)     Gout flares   Clonidine Derivatives Other (See Comments)    Zombie BP dropped    REVIEW OF SYSTEMS:   Review of Systems  Constitutional:  Positive for fatigue (Chronic and stable.).  Cardiovascular:  Positive for chest pain (Atrial fibrillation) and palpitations.  Gastrointestinal:  Positive for constipation.  Musculoskeletal:  Positive for arthralgias (Right knee) and gait problem (Uses a cane).  Neurological:  Positive for gait problem (Uses a cane).     VITALS:   Blood pressure 129/83, pulse 60, temperature 97.9 F (36.6 C), temperature source Oral, resp. rate 17, height 5\' 1"  (1.549 m), weight 150 lb 9.6 oz (68.3 kg), SpO2 100%.  Wt Readings from Last 3 Encounters:  03/07/23 150 lb 9.6 oz (68.3 kg)  02/04/23 151 lb 12.8 oz (68.9 kg)  01/21/23 150 lb 6.4 oz (68.2 kg)    Body mass index is 28.46 kg/m.  Performance status (ECOG): 1 - Symptomatic but completely ambulatory  PHYSICAL EXAM:   Physical Exam Constitutional:      Appearance: Normal appearance.  Cardiovascular:     Rate and Rhythm: Normal rate and regular rhythm.  Pulmonary:     Effort: Pulmonary effort is normal.     Breath sounds: Normal breath sounds.  Abdominal:     General: Bowel sounds are normal.     Palpations: Abdomen is soft.  Musculoskeletal:        General: No swelling. Normal range of motion.  Neurological:     Mental Status: She is alert and oriented to person, place, and time. Mental status is at baseline.     LABS:      Latest Ref Rng & Units 03/07/2023    8:14 AM 02/19/2023   10:13 AM 02/04/2023    9:54 AM  CBC  WBC 4.0 - 10.5 K/uL 4.9  5.3  4.8   Hemoglobin 12.0 - 15.0 g/dL 9.4  9.3  9.2   Hematocrit 36.0 - 46.0 % 29.9  30.0  29.7   Platelets 150 - 400  K/uL 159  192  218       Latest Ref Rng & Units 01/15/2023    4:13 PM 01/09/2023    2:47 PM 12/12/2022    2:00 PM  CMP  Glucose 70 - 99 mg/dL 098  119  99   BUN 8 - 27 mg/dL 31  36  25   Creatinine 0.57 - 1.00 mg/dL 1.47  8.29  5.62   Sodium 134 - 144 mmol/L 140  137  138   Potassium 3.5 - 5.2 mmol/L 5.1  4.5  4.4   Chloride 96 - 106 mmol/L 107  109  109   CO2 20 - 29 mmol/L 19  19  19    Calcium 8.7 - 10.3 mg/dL 9.9  13.0  86.5   Total Protein 6.0 - 8.5 g/dL 6.6   7.5  Total Bilirubin 0.0 - 1.2 mg/dL 0.2   0.4   Alkaline Phos 44 - 121 IU/L 84   73   AST 0 - 40 IU/L 18   21   ALT 0 - 32 IU/L 11   16      No results found for: "CEA1", "CEA" / No results found for: "CEA1", "CEA" No results found for: "PSA1" No results found for: "ZOX096" No results found for: "CAN125"  Lab Results  Component Value Date   TOTALPROTELP 7.2 11/16/2022   TOTALPROTELP 6.9 11/16/2022   ALBUMINELP 3.5 11/16/2022   A1GS 0.3 11/16/2022   A2GS 0.7 11/16/2022   BETS 1.0 11/16/2022   GAMS 1.4 11/16/2022   MSPIKE Not Observed 11/16/2022   SPEI Comment 11/16/2022   Lab Results  Component Value Date   TIBC 362 02/04/2023   TIBC 405 11/16/2022   TIBC 416 06/06/2022   FERRITIN 31 02/04/2023   FERRITIN 18 11/16/2022   FERRITIN 13 06/06/2022   IRONPCTSAT 22 02/04/2023   IRONPCTSAT 72 (H) 11/16/2022   IRONPCTSAT 32 (H) 06/06/2022   Lab Results  Component Value Date   LDH 152 11/16/2022     STUDIES:   No results found.

## 2023-03-07 NOTE — Patient Instructions (Signed)

## 2023-03-20 ENCOUNTER — Other Ambulatory Visit: Payer: Self-pay

## 2023-03-20 DIAGNOSIS — D631 Anemia in chronic kidney disease: Secondary | ICD-10-CM

## 2023-03-21 ENCOUNTER — Inpatient Hospital Stay: Payer: Medicare Other

## 2023-03-21 ENCOUNTER — Inpatient Hospital Stay (HOSPITAL_BASED_OUTPATIENT_CLINIC_OR_DEPARTMENT_OTHER): Payer: Medicare Other | Admitting: *Deleted

## 2023-03-21 VITALS — BP 144/78 | HR 60 | Temp 97.8°F | Resp 18

## 2023-03-21 DIAGNOSIS — K922 Gastrointestinal hemorrhage, unspecified: Secondary | ICD-10-CM | POA: Diagnosis not present

## 2023-03-21 DIAGNOSIS — E785 Hyperlipidemia, unspecified: Secondary | ICD-10-CM

## 2023-03-21 DIAGNOSIS — C7A010 Malignant carcinoid tumor of the duodenum: Secondary | ICD-10-CM | POA: Diagnosis not present

## 2023-03-21 DIAGNOSIS — I2510 Atherosclerotic heart disease of native coronary artery without angina pectoris: Secondary | ICD-10-CM | POA: Diagnosis not present

## 2023-03-21 DIAGNOSIS — Z888 Allergy status to other drugs, medicaments and biological substances status: Secondary | ICD-10-CM | POA: Diagnosis not present

## 2023-03-21 DIAGNOSIS — Z79899 Other long term (current) drug therapy: Secondary | ICD-10-CM | POA: Diagnosis not present

## 2023-03-21 DIAGNOSIS — K5900 Constipation, unspecified: Secondary | ICD-10-CM | POA: Diagnosis not present

## 2023-03-21 DIAGNOSIS — Z7901 Long term (current) use of anticoagulants: Secondary | ICD-10-CM | POA: Diagnosis not present

## 2023-03-21 DIAGNOSIS — Z8379 Family history of other diseases of the digestive system: Secondary | ICD-10-CM | POA: Diagnosis not present

## 2023-03-21 DIAGNOSIS — Z833 Family history of diabetes mellitus: Secondary | ICD-10-CM | POA: Diagnosis not present

## 2023-03-21 DIAGNOSIS — Z800 Family history of malignant neoplasm of digestive organs: Secondary | ICD-10-CM | POA: Diagnosis not present

## 2023-03-21 DIAGNOSIS — Z8249 Family history of ischemic heart disease and other diseases of the circulatory system: Secondary | ICD-10-CM | POA: Diagnosis not present

## 2023-03-21 DIAGNOSIS — D631 Anemia in chronic kidney disease: Secondary | ICD-10-CM

## 2023-03-21 DIAGNOSIS — Z87891 Personal history of nicotine dependence: Secondary | ICD-10-CM | POA: Diagnosis not present

## 2023-03-21 DIAGNOSIS — D500 Iron deficiency anemia secondary to blood loss (chronic): Secondary | ICD-10-CM

## 2023-03-21 DIAGNOSIS — E039 Hypothyroidism, unspecified: Secondary | ICD-10-CM | POA: Diagnosis not present

## 2023-03-21 DIAGNOSIS — Z885 Allergy status to narcotic agent status: Secondary | ICD-10-CM | POA: Diagnosis not present

## 2023-03-21 DIAGNOSIS — I4891 Unspecified atrial fibrillation: Secondary | ICD-10-CM | POA: Diagnosis not present

## 2023-03-21 DIAGNOSIS — D472 Monoclonal gammopathy: Secondary | ICD-10-CM | POA: Diagnosis not present

## 2023-03-21 DIAGNOSIS — Z803 Family history of malignant neoplasm of breast: Secondary | ICD-10-CM | POA: Diagnosis not present

## 2023-03-21 DIAGNOSIS — G8929 Other chronic pain: Secondary | ICD-10-CM | POA: Diagnosis not present

## 2023-03-21 DIAGNOSIS — N1832 Chronic kidney disease, stage 3b: Secondary | ICD-10-CM | POA: Diagnosis not present

## 2023-03-21 DIAGNOSIS — Z825 Family history of asthma and other chronic lower respiratory diseases: Secondary | ICD-10-CM | POA: Diagnosis not present

## 2023-03-21 DIAGNOSIS — I10 Essential (primary) hypertension: Secondary | ICD-10-CM

## 2023-03-21 DIAGNOSIS — I129 Hypertensive chronic kidney disease with stage 1 through stage 4 chronic kidney disease, or unspecified chronic kidney disease: Secondary | ICD-10-CM | POA: Diagnosis not present

## 2023-03-21 LAB — CBC WITH DIFFERENTIAL/PLATELET
Abs Immature Granulocytes: 0.01 10*3/uL (ref 0.00–0.07)
Basophils Absolute: 0 10*3/uL (ref 0.0–0.1)
Basophils Relative: 1 %
Eosinophils Absolute: 0.4 10*3/uL (ref 0.0–0.5)
Eosinophils Relative: 10 %
HCT: 34.2 % — ABNORMAL LOW (ref 36.0–46.0)
Hemoglobin: 10.6 g/dL — ABNORMAL LOW (ref 12.0–15.0)
Immature Granulocytes: 0 %
Lymphocytes Relative: 25 %
Lymphs Abs: 1 10*3/uL (ref 0.7–4.0)
MCH: 30 pg (ref 26.0–34.0)
MCHC: 31 g/dL (ref 30.0–36.0)
MCV: 96.9 fL (ref 80.0–100.0)
Monocytes Absolute: 0.5 10*3/uL (ref 0.1–1.0)
Monocytes Relative: 12 %
Neutro Abs: 2 10*3/uL (ref 1.7–7.7)
Neutrophils Relative %: 52 %
Platelets: 177 10*3/uL (ref 150–400)
RBC: 3.53 MIL/uL — ABNORMAL LOW (ref 3.87–5.11)
RDW: 13.6 % (ref 11.5–15.5)
WBC: 3.9 10*3/uL — ABNORMAL LOW (ref 4.0–10.5)
nRBC: 0 % (ref 0.0–0.2)

## 2023-03-21 LAB — SAMPLE TO BLOOD BANK

## 2023-03-21 LAB — COMPREHENSIVE METABOLIC PANEL
ALT: 14 U/L (ref 0–44)
AST: 20 U/L (ref 15–41)
Albumin: 4 g/dL (ref 3.5–5.0)
Alkaline Phosphatase: 89 U/L (ref 38–126)
Anion gap: 9 (ref 5–15)
BUN: 36 mg/dL — ABNORMAL HIGH (ref 8–23)
CO2: 22 mmol/L (ref 22–32)
Calcium: 10.6 mg/dL — ABNORMAL HIGH (ref 8.9–10.3)
Chloride: 109 mmol/L (ref 98–111)
Creatinine, Ser: 1.76 mg/dL — ABNORMAL HIGH (ref 0.44–1.00)
GFR, Estimated: 29 mL/min — ABNORMAL LOW (ref >60)
Glucose, Bld: 90 mg/dL (ref 70–99)
Potassium: 4.9 mmol/L (ref 3.5–5.1)
Sodium: 140 mmol/L (ref 135–145)
Total Bilirubin: 0.6 mg/dL (ref 0.3–1.2)
Total Protein: 7.8 g/dL (ref 6.5–8.1)

## 2023-03-21 LAB — LIPID PANEL
Cholesterol: 112 mg/dL (ref 0–200)
HDL: 53 mg/dL (ref >40)
LDL Cholesterol: 56 mg/dL (ref 0–99)
Total CHOL/HDL Ratio: 2.1 RATIO
Triglycerides: 13 mg/dL (ref <150)
VLDL: 3 mg/dL (ref 0–40)

## 2023-03-21 LAB — TSH: TSH: 0.505 u[IU]/mL (ref 0.350–4.500)

## 2023-03-21 MED ORDER — EPOETIN ALFA-EPBX 10000 UNIT/ML IJ SOLN
10000.0000 [IU] | Freq: Once | INTRAMUSCULAR | Status: AC
  Administered 2023-03-21: 10000 [IU] via SUBCUTANEOUS
  Filled 2023-03-21: qty 1

## 2023-03-21 NOTE — Patient Instructions (Signed)
[facility]  Discharge Instructions: Thank you for choosing [facility] to provide your oncology and hematology care.  If you have a lab appointment with the Cancer Center - please note that after April 8th, 2024, all labs will be drawn in the cancer center.  You do not have to check in or register with the main entrance as you have in the past but will complete your check-in in the cancer center.  Wear comfortable clothing and clothing appropriate for easy access to any Portacath or PICC line.   We strive to give you quality time with your provider. You may need to reschedule your appointment if you arrive late (15 or more minutes).  Arriving late affects you and other patients whose appointments are after yours.  Also, if you miss three or more appointments without notifying the office, you may be dismissed from the clinic at the provider's discretion.      For prescription refill requests, have your pharmacy contact our office and allow 72 hours for refills to be completed.    Today you received Retacrit 10,000U     BELOW ARE SYMPTOMS THAT SHOULD BE REPORTED IMMEDIATELY: *FEVER GREATER THAN 100.4 F (38 C) OR HIGHER *CHILLS OR SWEATING *NAUSEA AND VOMITING THAT IS NOT CONTROLLED WITH YOUR NAUSEA MEDICATION *UNUSUAL SHORTNESS OF BREATH *UNUSUAL BRUISING OR BLEEDING *URINARY PROBLEMS (pain or burning when urinating, or frequent urination) *BOWEL PROBLEMS (unusual diarrhea, constipation, pain near the anus) TENDERNESS IN MOUTH AND THROAT WITH OR WITHOUT PRESENCE OF ULCERS (sore throat, sores in mouth, or a toothache) UNUSUAL RASH, SWELLING OR PAIN  UNUSUAL VAGINAL DISCHARGE OR ITCHING   Items with * indicate a potential emergency and should be followed up as soon as possible or go to the Emergency Department if any problems should occur.  Please show the CHEMOTHERAPY ALERT CARD or IMMUNOTHERAPY ALERT CARD at check-in to the Emergency Department and  triage nurse.  Should you have questions after your visit or need to cancel or reschedule your appointment, please contact [facility] [phone]  and follow the prompts.  Office hours are 8:00 a.m. to 4:30 p.m. Monday - Friday. Please note that voicemails left after 4:00 p.m. may not be returned until the following business day.  We are closed weekends and major holidays. You have access to a nurse at all times for urgent questions. Please call the main number to the clinic [phone] and follow the prompts.  For any non-urgent questions, you may also contact your provider using MyChart. We now offer e-Visits for anyone 11 and older to request care online for non-urgent symptoms. For details visit [email or web address].   Also download the MyChart app! Go to the app store, search "MyChart", open the app, select [place], and log in with your MyChart username and password.

## 2023-03-21 NOTE — Progress Notes (Signed)
[name] [name] presents today for injection per the provider's orders.  Retacrit 10,000U administration without incident; injection site WNL; see MAR for injection details.  Patient tolerated procedure well and without incident.  No questions or complaints noted at this time. Patient's hemoglobin noted to be 10.6 today.  Discharged from clinic via wheelchair in stable condition. Alert and oriented x 3. F/U with [facility] as scheduled.

## 2023-03-25 ENCOUNTER — Telehealth: Payer: Self-pay | Admitting: Family Medicine

## 2023-03-25 NOTE — Telephone Encounter (Signed)
Patient wants a call back with lab results.

## 2023-03-28 ENCOUNTER — Ambulatory Visit (INDEPENDENT_AMBULATORY_CARE_PROVIDER_SITE_OTHER): Payer: Medicare Other | Admitting: Gastroenterology

## 2023-03-28 ENCOUNTER — Ambulatory Visit: Payer: Medicare Other | Admitting: Oncology

## 2023-03-29 NOTE — Telephone Encounter (Signed)
Would like lab results- no lab result notes entered. Pls advise when available

## 2023-04-01 ENCOUNTER — Other Ambulatory Visit: Payer: Self-pay | Admitting: Family Medicine

## 2023-04-01 NOTE — Telephone Encounter (Signed)
Patient aware.

## 2023-04-03 ENCOUNTER — Other Ambulatory Visit: Payer: Self-pay

## 2023-04-03 ENCOUNTER — Ambulatory Visit: Payer: Medicare Other | Admitting: Family Medicine

## 2023-04-03 DIAGNOSIS — N1832 Chronic kidney disease, stage 3b: Secondary | ICD-10-CM

## 2023-04-04 ENCOUNTER — Inpatient Hospital Stay: Payer: Medicare Other

## 2023-04-04 ENCOUNTER — Other Ambulatory Visit: Payer: Self-pay

## 2023-04-04 ENCOUNTER — Inpatient Hospital Stay: Payer: Medicare Other | Attending: Hematology

## 2023-04-04 DIAGNOSIS — D631 Anemia in chronic kidney disease: Secondary | ICD-10-CM | POA: Diagnosis not present

## 2023-04-04 DIAGNOSIS — I129 Hypertensive chronic kidney disease with stage 1 through stage 4 chronic kidney disease, or unspecified chronic kidney disease: Secondary | ICD-10-CM | POA: Diagnosis not present

## 2023-04-04 DIAGNOSIS — N1832 Chronic kidney disease, stage 3b: Secondary | ICD-10-CM

## 2023-04-04 DIAGNOSIS — Z79899 Other long term (current) drug therapy: Secondary | ICD-10-CM | POA: Diagnosis not present

## 2023-04-04 DIAGNOSIS — C7A010 Malignant carcinoid tumor of the duodenum: Secondary | ICD-10-CM | POA: Diagnosis not present

## 2023-04-04 LAB — CBC WITH DIFFERENTIAL/PLATELET
Abs Immature Granulocytes: 0.03 10*3/uL (ref 0.00–0.07)
Basophils Absolute: 0.1 10*3/uL (ref 0.0–0.1)
Basophils Relative: 1 %
Eosinophils Absolute: 0.4 10*3/uL (ref 0.0–0.5)
Eosinophils Relative: 7 %
HCT: 35.8 % — ABNORMAL LOW (ref 36.0–46.0)
Hemoglobin: 11.5 g/dL — ABNORMAL LOW (ref 12.0–15.0)
Immature Granulocytes: 1 %
Lymphocytes Relative: 19 %
Lymphs Abs: 0.9 10*3/uL (ref 0.7–4.0)
MCH: 30.4 pg (ref 26.0–34.0)
MCHC: 32.1 g/dL (ref 30.0–36.0)
MCV: 94.7 fL (ref 80.0–100.0)
Monocytes Absolute: 0.4 10*3/uL (ref 0.1–1.0)
Monocytes Relative: 9 %
Neutro Abs: 3.2 10*3/uL (ref 1.7–7.7)
Neutrophils Relative %: 63 %
Platelets: 177 10*3/uL (ref 150–400)
RBC: 3.78 MIL/uL — ABNORMAL LOW (ref 3.87–5.11)
RDW: 13.5 % (ref 11.5–15.5)
WBC: 5 10*3/uL (ref 4.0–10.5)
nRBC: 0 % (ref 0.0–0.2)

## 2023-04-04 LAB — RENAL FUNCTION PANEL
Albumin: 4 g/dL (ref 3.5–5.0)
Anion gap: 6 (ref 5–15)
BUN: 38 mg/dL — ABNORMAL HIGH (ref 8–23)
CO2: 26 mmol/L (ref 22–32)
Calcium: 10.4 mg/dL — ABNORMAL HIGH (ref 8.9–10.3)
Chloride: 108 mmol/L (ref 98–111)
Creatinine, Ser: 1.58 mg/dL — ABNORMAL HIGH (ref 0.44–1.00)
GFR, Estimated: 33 mL/min — ABNORMAL LOW (ref >60)
Glucose, Bld: 90 mg/dL (ref 70–99)
Phosphorus: 3.6 mg/dL (ref 2.5–4.6)
Potassium: 4.3 mmol/L (ref 3.5–5.1)
Sodium: 140 mmol/L (ref 135–145)

## 2023-04-04 LAB — SAMPLE TO BLOOD BANK

## 2023-04-04 LAB — PROTEIN, URINE, RANDOM: Total Protein, Urine: 16 mg/dL

## 2023-04-04 LAB — CREATININE, URINE, RANDOM: Creatinine, Urine: 32 mg/dL

## 2023-04-04 NOTE — Progress Notes (Signed)
No Retacrit today per order parameters for Hgb 11.5.

## 2023-04-05 LAB — PTH, INTACT AND CALCIUM
Calcium, Total (PTH): 10.3 mg/dL (ref 8.7–10.3)
PTH: 48 pg/mL (ref 15–65)

## 2023-04-06 ENCOUNTER — Other Ambulatory Visit: Payer: Self-pay | Admitting: Nurse Practitioner

## 2023-04-16 DIAGNOSIS — I5032 Chronic diastolic (congestive) heart failure: Secondary | ICD-10-CM | POA: Diagnosis not present

## 2023-04-16 DIAGNOSIS — R809 Proteinuria, unspecified: Secondary | ICD-10-CM | POA: Diagnosis not present

## 2023-04-16 DIAGNOSIS — N1832 Chronic kidney disease, stage 3b: Secondary | ICD-10-CM | POA: Diagnosis not present

## 2023-04-17 ENCOUNTER — Other Ambulatory Visit: Payer: Self-pay

## 2023-04-17 ENCOUNTER — Telehealth: Payer: Self-pay | Admitting: Family Medicine

## 2023-04-17 DIAGNOSIS — D631 Anemia in chronic kidney disease: Secondary | ICD-10-CM

## 2023-04-17 NOTE — Telephone Encounter (Addendum)
[employer] called in on patient behalf   Manufacturer change on patients levothyroxine (SYNTHROID) 100 MCG tablet   Needs provider approval to change manufacturer  Call back [phone]  Reference # [id number]

## 2023-04-18 ENCOUNTER — Inpatient Hospital Stay: Payer: Medicare Other

## 2023-04-18 ENCOUNTER — Telehealth: Payer: Self-pay | Admitting: Family Medicine

## 2023-04-18 ENCOUNTER — Other Ambulatory Visit: Payer: Self-pay

## 2023-04-18 VITALS — BP 128/60 | HR 70 | Temp 97.2°F | Resp 18

## 2023-04-18 DIAGNOSIS — N1832 Chronic kidney disease, stage 3b: Secondary | ICD-10-CM | POA: Diagnosis not present

## 2023-04-18 DIAGNOSIS — D500 Iron deficiency anemia secondary to blood loss (chronic): Secondary | ICD-10-CM

## 2023-04-18 DIAGNOSIS — D631 Anemia in chronic kidney disease: Secondary | ICD-10-CM

## 2023-04-18 DIAGNOSIS — Z79899 Other long term (current) drug therapy: Secondary | ICD-10-CM | POA: Diagnosis not present

## 2023-04-18 DIAGNOSIS — I129 Hypertensive chronic kidney disease with stage 1 through stage 4 chronic kidney disease, or unspecified chronic kidney disease: Secondary | ICD-10-CM | POA: Diagnosis not present

## 2023-04-18 DIAGNOSIS — C7A010 Malignant carcinoid tumor of the duodenum: Secondary | ICD-10-CM | POA: Diagnosis not present

## 2023-04-18 LAB — CBC WITH DIFFERENTIAL/PLATELET
Abs Immature Granulocytes: 0.02 10*3/uL (ref 0.00–0.07)
Basophils Absolute: 0 10*3/uL (ref 0.0–0.1)
Basophils Relative: 1 %
Eosinophils Absolute: 0.3 10*3/uL (ref 0.0–0.5)
Eosinophils Relative: 8 %
HCT: 33.8 % — ABNORMAL LOW (ref 36.0–46.0)
Hemoglobin: 10.9 g/dL — ABNORMAL LOW (ref 12.0–15.0)
Immature Granulocytes: 1 %
Lymphocytes Relative: 22 %
Lymphs Abs: 0.9 10*3/uL (ref 0.7–4.0)
MCH: 30.4 pg (ref 26.0–34.0)
MCHC: 32.2 g/dL (ref 30.0–36.0)
MCV: 94.2 fL (ref 80.0–100.0)
Monocytes Absolute: 0.5 10*3/uL (ref 0.1–1.0)
Monocytes Relative: 11 %
Neutro Abs: 2.5 10*3/uL (ref 1.7–7.7)
Neutrophils Relative %: 57 %
Platelets: 173 10*3/uL (ref 150–400)
RBC: 3.59 MIL/uL — ABNORMAL LOW (ref 3.87–5.11)
RDW: 13 % (ref 11.5–15.5)
WBC: 4.2 10*3/uL (ref 4.0–10.5)
nRBC: 0 % (ref 0.0–0.2)

## 2023-04-18 LAB — SAMPLE TO BLOOD BANK

## 2023-04-18 MED ORDER — LEVOTHYROXINE SODIUM 100 MCG PO TABS: 100.0000 ug | ORAL_TABLET | Freq: Every day | ORAL | 2 refills | Status: DC

## 2023-04-18 MED ORDER — EPOETIN ALFA-EPBX 10000 UNIT/ML IJ SOLN
10000.0000 [IU] | Freq: Once | INTRAMUSCULAR | Status: AC
  Administered 2023-04-18: 10000 [IU] via SUBCUTANEOUS
  Filled 2023-04-18: qty 1

## 2023-04-18 NOTE — Patient Instructions (Signed)
[facility]  Discharge Instructions: Thank you for choosing [facility] to provide your oncology and hematology care.  If you have a lab appointment with the Cancer Center - please note that after April 8th, 2024, all labs will be drawn in the cancer center.  You do not have to check in or register with the main entrance as you have in the past but will complete your check-in in the cancer center.  Wear comfortable clothing and clothing appropriate for easy access to any Portacath or PICC line.   We strive to give you quality time with your provider. You may need to reschedule your appointment if you arrive late (15 or more minutes).  Arriving late affects you and other patients whose appointments are after yours.  Also, if you miss three or more appointments without notifying the office, you may be dismissed from the clinic at the provider's discretion.      For prescription refill requests, have your pharmacy contact our office and allow 72 hours for refills to be completed.    Today you received the following chemotherapy and/or immunotherapy agents Retacrit      To help prevent nausea and vomiting after your treatment, we encourage you to take your nausea medication as directed.  BELOW ARE SYMPTOMS THAT SHOULD BE REPORTED IMMEDIATELY: *FEVER GREATER THAN 100.4 F (38 C) OR HIGHER *CHILLS OR SWEATING *NAUSEA AND VOMITING THAT IS NOT CONTROLLED WITH YOUR NAUSEA MEDICATION *UNUSUAL SHORTNESS OF BREATH *UNUSUAL BRUISING OR BLEEDING *URINARY PROBLEMS (pain or burning when urinating, or frequent urination) *BOWEL PROBLEMS (unusual diarrhea, constipation, pain near the anus) TENDERNESS IN MOUTH AND THROAT WITH OR WITHOUT PRESENCE OF ULCERS (sore throat, sores in mouth, or a toothache) UNUSUAL RASH, SWELLING OR PAIN  UNUSUAL VAGINAL DISCHARGE OR ITCHING   Items with * indicate a potential emergency and should be followed up as soon as possible or go to the  Emergency Department if any problems should occur.  Please show the CHEMOTHERAPY ALERT CARD or IMMUNOTHERAPY ALERT CARD at check-in to the Emergency Department and triage nurse.  Should you have questions after your visit or need to cancel or reschedule your appointment, please contact [facility] [phone]  and follow the prompts.  Office hours are 8:00 a.m. to 4:30 p.m. Monday - Friday. Please note that voicemails left after 4:00 p.m. may not be returned until the following business day.  We are closed weekends and major holidays. You have access to a nurse at all times for urgent questions. Please call the main number to the clinic [phone] and follow the prompts.  For any non-urgent questions, you may also contact your provider using MyChart. We now offer e-Visits for anyone 26 and older to request care online for non-urgent symptoms. For details visit [email or web address].   Also download the MyChart app! Go to the app store, search "MyChart", open the app, select [place], and log in with your MyChart username and password.

## 2023-04-18 NOTE — Telephone Encounter (Signed)
New rx entered with note - ok to change to different manufactuer

## 2023-04-18 NOTE — Telephone Encounter (Signed)
Barbara Cower called from [employer] about the prescription Levothryroxin 100 mcg changing manufactures and needs approval. [phone].

## 2023-04-18 NOTE — Progress Notes (Signed)
Patient presents today for Retacrit injection per providers order.  Vital signs WNL.  HBG noted to be 10.9.  Patient has no new complaints at this time.  Stable during administration without incident; injection site WNL; see MAR for injection details.  Patient tolerated procedure well and without incident.  No questions or complaints noted at this time.

## 2023-04-27 ENCOUNTER — Other Ambulatory Visit: Payer: Self-pay | Admitting: Family Medicine

## 2023-05-01 ENCOUNTER — Other Ambulatory Visit: Payer: Self-pay

## 2023-05-01 DIAGNOSIS — D500 Iron deficiency anemia secondary to blood loss (chronic): Secondary | ICD-10-CM

## 2023-05-01 DIAGNOSIS — N1832 Chronic kidney disease, stage 3b: Secondary | ICD-10-CM

## 2023-05-01 NOTE — Progress Notes (Signed)
Lab orders entered

## 2023-05-02 ENCOUNTER — Inpatient Hospital Stay: Payer: Medicare Other

## 2023-05-07 ENCOUNTER — Ambulatory Visit: Payer: Medicare Other | Admitting: Family Medicine

## 2023-05-16 ENCOUNTER — Inpatient Hospital Stay: Payer: Medicare Other

## 2023-05-16 ENCOUNTER — Inpatient Hospital Stay: Payer: Medicare Other | Attending: Hematology

## 2023-05-16 VITALS — BP 112/48 | HR 65 | Temp 97.2°F

## 2023-05-16 DIAGNOSIS — Z79899 Other long term (current) drug therapy: Secondary | ICD-10-CM | POA: Insufficient documentation

## 2023-05-16 DIAGNOSIS — D500 Iron deficiency anemia secondary to blood loss (chronic): Secondary | ICD-10-CM

## 2023-05-16 DIAGNOSIS — C7A010 Malignant carcinoid tumor of the duodenum: Secondary | ICD-10-CM | POA: Insufficient documentation

## 2023-05-16 DIAGNOSIS — D631 Anemia in chronic kidney disease: Secondary | ICD-10-CM

## 2023-05-16 LAB — CBC WITH DIFFERENTIAL/PLATELET
Abs Immature Granulocytes: 0.01 10*3/uL (ref 0.00–0.07)
Basophils Absolute: 0.1 10*3/uL (ref 0.0–0.1)
Basophils Relative: 1 %
Eosinophils Absolute: 0.4 10*3/uL (ref 0.0–0.5)
Eosinophils Relative: 9 %
HCT: 29.5 % — ABNORMAL LOW (ref 36.0–46.0)
Hemoglobin: 9.2 g/dL — ABNORMAL LOW (ref 12.0–15.0)
Immature Granulocytes: 0 %
Lymphocytes Relative: 23 %
Lymphs Abs: 0.9 10*3/uL (ref 0.7–4.0)
MCH: 29.2 pg (ref 26.0–34.0)
MCHC: 31.2 g/dL (ref 30.0–36.0)
MCV: 93.7 fL (ref 80.0–100.0)
Monocytes Absolute: 0.4 10*3/uL (ref 0.1–1.0)
Monocytes Relative: 9 %
Neutro Abs: 2.3 10*3/uL (ref 1.7–7.7)
Neutrophils Relative %: 58 %
Platelets: 170 10*3/uL (ref 150–400)
RBC: 3.15 MIL/uL — ABNORMAL LOW (ref 3.87–5.11)
RDW: 13.8 % (ref 11.5–15.5)
WBC: 3.9 10*3/uL — ABNORMAL LOW (ref 4.0–10.5)
nRBC: 0 % (ref 0.0–0.2)

## 2023-05-16 LAB — SAMPLE TO BLOOD BANK

## 2023-05-16 MED ORDER — EPOETIN ALFA-EPBX 10000 UNIT/ML IJ SOLN
10000.0000 [IU] | Freq: Once | INTRAMUSCULAR | Status: AC
Start: 2023-05-16 — End: 2023-05-16
  Administered 2023-05-16: 10000 [IU] via SUBCUTANEOUS
  Filled 2023-05-16: qty 1

## 2023-05-16 NOTE — Progress Notes (Signed)
Patient's Hgb 9.2. Patient tolerated injection well with no complaints voiced.  Site clean and dry with no bruising or swelling noted at site.  See MAR for details.  Band aid applied.  Patient stable during and after injection.  Vss with discharge and left in satisfactory condition with no s/s of distress noted. All follow ups as scheduled.   Lawrie Tunks [employer]

## 2023-05-16 NOTE — Patient Instructions (Signed)

## 2023-05-21 ENCOUNTER — Ambulatory Visit (INDEPENDENT_AMBULATORY_CARE_PROVIDER_SITE_OTHER): Payer: Medicare Other | Admitting: Family Medicine

## 2023-05-21 ENCOUNTER — Encounter: Payer: Self-pay | Admitting: Family Medicine

## 2023-05-21 VITALS — BP 109/55 | HR 64 | Ht 61.0 in | Wt 144.0 lb

## 2023-05-21 DIAGNOSIS — I10 Essential (primary) hypertension: Secondary | ICD-10-CM | POA: Diagnosis not present

## 2023-05-21 DIAGNOSIS — E785 Hyperlipidemia, unspecified: Secondary | ICD-10-CM

## 2023-05-21 DIAGNOSIS — E038 Other specified hypothyroidism: Secondary | ICD-10-CM

## 2023-05-21 DIAGNOSIS — Z0000 Encounter for general adult medical examination without abnormal findings: Secondary | ICD-10-CM | POA: Diagnosis not present

## 2023-05-21 DIAGNOSIS — Z23 Encounter for immunization: Secondary | ICD-10-CM

## 2023-05-21 NOTE — Progress Notes (Unsigned)
    [name] [name]     MRN: [id number]      DOB: [birth year]  Chief Complaint  Patient presents with   Annual Exam    CPE /flu shot    HPI: Patient is in for annual physical exam. No other health concerns are expressed or addressed at the visit. Recent labs,  are reviewed. Immunization is reviewed , and  updated if needed.   PE:  BP (!) 109/55 (BP Location: Right Arm, Patient Position: Sitting, Cuff Size: Large)   Pulse 64   Ht 5\' 1"  (1.549 m)   Wt 144 lb (65.3 kg)   SpO2 92%   BMI 27.21 kg/m   Pleasant  female, alert and oriented x 3, in no cardio-pulmonary distress. Afebrile. HEENT No facial trauma or asymetry. Sinuses non tender.  Extra occullar muscles intact.. External ears normal, . Neck: supple, no adenopathy,JVD or thyromegaly.No bruits.  Chest: Clear to ascultation bilaterally.No crackles or wheezes. Non tender to palpation  Breast: No asymetry,no masses or lumps. No tenderness. No nipple discharge or inversion. No axillary or supraclavicular adenopathy  Cardiovascular system; Heart sounds normal,  S1 and  S2 ,no S3.  No murmur, or thrill. Apical beat not displaced Peripheral pulses normal.  Abdomen: Soft, non tender, no organomegaly or masses. No bruits. Bowel sounds normal. No guarding, tenderness or rebound.   GU: External genitalia normal female genitalia , normal female distribution of hair. No lesions. Urethral meatus normal in size, no  Prolapse, no lesions visibly  Present. Bladder non tender. Vagina pink and moist , with no visible lesions , discharge present . Adequate pelvic support no  cystocele or rectocele noted Cervix pink and appears healthy, no lesions or ulcerations noted, no discharge noted from os Uterus normal size, no adnexal masses, no cervical motion or adnexal tenderness.   Musculoskeletal exam: Full ROM of spine, hips , shoulders and knees. No deformity ,swelling or crepitus noted. No muscle wasting or atrophy.    Neurologic: Cranial nerves 2 to 12 intact. Power, tone ,sensation and reflexes normal throughout. No disturbance in gait. No tremor.  Skin: Intact, no ulceration, erythema , scaling or rash noted. Pigmentation normal throughout  Psych; Normal mood and affect. Judgement and concentration normal   Assessment & Plan:  No problem-specific Assessment & Plan notes found for this encounter.

## 2023-05-21 NOTE — Patient Instructions (Addendum)
F/un I April, call if you need me sooner  Fasting lipid, hepatic and TSH , hospital draw on 12/05 when she is having other labs  Flu vaccine todaty  In 2 weeks get Covid vaccine  In 4 weeks get TdAP  Careful not to fall  Keep hanging with nieces, and left's start chair dancing/ exercises at least 3 days per week  Thanks for choosing [facility], we consider it a privelige to serve you.

## 2023-05-22 DIAGNOSIS — Z0000 Encounter for general adult medical examination without abnormal findings: Secondary | ICD-10-CM | POA: Insufficient documentation

## 2023-05-22 DIAGNOSIS — Z23 Encounter for immunization: Secondary | ICD-10-CM | POA: Insufficient documentation

## 2023-05-22 NOTE — Assessment & Plan Note (Signed)

## 2023-05-22 NOTE — Assessment & Plan Note (Signed)
After obtaining informed consent, the influenza vaccine is  administered , with no adverse effect noted at the time of administration.

## 2023-05-29 ENCOUNTER — Ambulatory Visit: Payer: Medicare Other | Admitting: Cardiology

## 2023-06-05 ENCOUNTER — Other Ambulatory Visit: Payer: Self-pay

## 2023-06-05 DIAGNOSIS — D631 Anemia in chronic kidney disease: Secondary | ICD-10-CM

## 2023-06-05 DIAGNOSIS — D500 Iron deficiency anemia secondary to blood loss (chronic): Secondary | ICD-10-CM

## 2023-06-05 DIAGNOSIS — N1832 Chronic kidney disease, stage 3b: Secondary | ICD-10-CM

## 2023-06-06 ENCOUNTER — Inpatient Hospital Stay: Payer: Medicare Other

## 2023-06-06 ENCOUNTER — Other Ambulatory Visit: Payer: Self-pay | Admitting: Family Medicine

## 2023-06-06 ENCOUNTER — Inpatient Hospital Stay: Payer: Medicare Other | Attending: Hematology | Admitting: Oncology

## 2023-06-06 ENCOUNTER — Inpatient Hospital Stay (HOSPITAL_BASED_OUTPATIENT_CLINIC_OR_DEPARTMENT_OTHER): Payer: Medicare Other | Admitting: Hematology

## 2023-06-06 VITALS — BP 153/74 | HR 59 | Temp 97.5°F | Resp 16 | Wt 139.8 lb

## 2023-06-06 DIAGNOSIS — K922 Gastrointestinal hemorrhage, unspecified: Secondary | ICD-10-CM | POA: Insufficient documentation

## 2023-06-06 DIAGNOSIS — Z87891 Personal history of nicotine dependence: Secondary | ICD-10-CM | POA: Diagnosis not present

## 2023-06-06 DIAGNOSIS — E669 Obesity, unspecified: Secondary | ICD-10-CM | POA: Diagnosis not present

## 2023-06-06 DIAGNOSIS — D631 Anemia in chronic kidney disease: Secondary | ICD-10-CM | POA: Insufficient documentation

## 2023-06-06 DIAGNOSIS — Z841 Family history of disorders of kidney and ureter: Secondary | ICD-10-CM | POA: Insufficient documentation

## 2023-06-06 DIAGNOSIS — D3A010 Benign carcinoid tumor of the duodenum: Secondary | ICD-10-CM

## 2023-06-06 DIAGNOSIS — I4891 Unspecified atrial fibrillation: Secondary | ICD-10-CM | POA: Insufficient documentation

## 2023-06-06 DIAGNOSIS — E038 Other specified hypothyroidism: Secondary | ICD-10-CM

## 2023-06-06 DIAGNOSIS — D500 Iron deficiency anemia secondary to blood loss (chronic): Secondary | ICD-10-CM

## 2023-06-06 DIAGNOSIS — Z8249 Family history of ischemic heart disease and other diseases of the circulatory system: Secondary | ICD-10-CM | POA: Insufficient documentation

## 2023-06-06 DIAGNOSIS — Z888 Allergy status to other drugs, medicaments and biological substances status: Secondary | ICD-10-CM | POA: Diagnosis not present

## 2023-06-06 DIAGNOSIS — E538 Deficiency of other specified B group vitamins: Secondary | ICD-10-CM | POA: Insufficient documentation

## 2023-06-06 DIAGNOSIS — Z7901 Long term (current) use of anticoagulants: Secondary | ICD-10-CM | POA: Insufficient documentation

## 2023-06-06 DIAGNOSIS — Z79890 Hormone replacement therapy (postmenopausal): Secondary | ICD-10-CM | POA: Diagnosis not present

## 2023-06-06 DIAGNOSIS — Z885 Allergy status to narcotic agent status: Secondary | ICD-10-CM | POA: Insufficient documentation

## 2023-06-06 DIAGNOSIS — N1832 Chronic kidney disease, stage 3b: Secondary | ICD-10-CM

## 2023-06-06 DIAGNOSIS — M25561 Pain in right knee: Secondary | ICD-10-CM | POA: Insufficient documentation

## 2023-06-06 DIAGNOSIS — Z79899 Other long term (current) drug therapy: Secondary | ICD-10-CM | POA: Insufficient documentation

## 2023-06-06 DIAGNOSIS — Z825 Family history of asthma and other chronic lower respiratory diseases: Secondary | ICD-10-CM | POA: Insufficient documentation

## 2023-06-06 DIAGNOSIS — I129 Hypertensive chronic kidney disease with stage 1 through stage 4 chronic kidney disease, or unspecified chronic kidney disease: Secondary | ICD-10-CM | POA: Insufficient documentation

## 2023-06-06 DIAGNOSIS — K5900 Constipation, unspecified: Secondary | ICD-10-CM | POA: Insufficient documentation

## 2023-06-06 DIAGNOSIS — Z8379 Family history of other diseases of the digestive system: Secondary | ICD-10-CM | POA: Diagnosis not present

## 2023-06-06 DIAGNOSIS — C7A010 Malignant carcinoid tumor of the duodenum: Secondary | ICD-10-CM | POA: Diagnosis not present

## 2023-06-06 DIAGNOSIS — I10 Essential (primary) hypertension: Secondary | ICD-10-CM

## 2023-06-06 DIAGNOSIS — E785 Hyperlipidemia, unspecified: Secondary | ICD-10-CM | POA: Diagnosis not present

## 2023-06-06 DIAGNOSIS — Z803 Family history of malignant neoplasm of breast: Secondary | ICD-10-CM | POA: Insufficient documentation

## 2023-06-06 DIAGNOSIS — D472 Monoclonal gammopathy: Secondary | ICD-10-CM | POA: Diagnosis not present

## 2023-06-06 DIAGNOSIS — E039 Hypothyroidism, unspecified: Secondary | ICD-10-CM | POA: Diagnosis not present

## 2023-06-06 DIAGNOSIS — I2510 Atherosclerotic heart disease of native coronary artery without angina pectoris: Secondary | ICD-10-CM | POA: Insufficient documentation

## 2023-06-06 DIAGNOSIS — G8929 Other chronic pain: Secondary | ICD-10-CM | POA: Diagnosis not present

## 2023-06-06 DIAGNOSIS — R079 Chest pain, unspecified: Secondary | ICD-10-CM | POA: Insufficient documentation

## 2023-06-06 DIAGNOSIS — Z833 Family history of diabetes mellitus: Secondary | ICD-10-CM | POA: Insufficient documentation

## 2023-06-06 DIAGNOSIS — Z800 Family history of malignant neoplasm of digestive organs: Secondary | ICD-10-CM | POA: Insufficient documentation

## 2023-06-06 LAB — SAMPLE TO BLOOD BANK

## 2023-06-06 LAB — COMPREHENSIVE METABOLIC PANEL
ALT: 19 U/L (ref 0–44)
AST: 26 U/L (ref 15–41)
Albumin: 3.9 g/dL (ref 3.5–5.0)
Alkaline Phosphatase: 84 U/L (ref 38–126)
Anion gap: 7 (ref 5–15)
BUN: 30 mg/dL — ABNORMAL HIGH (ref 8–23)
CO2: 21 mmol/L — ABNORMAL LOW (ref 22–32)
Calcium: 10.2 mg/dL (ref 8.9–10.3)
Chloride: 109 mmol/L (ref 98–111)
Creatinine, Ser: 1.44 mg/dL — ABNORMAL HIGH (ref 0.44–1.00)
GFR, Estimated: 37 mL/min — ABNORMAL LOW (ref >60)
Glucose, Bld: 89 mg/dL (ref 70–99)
Potassium: 4.3 mmol/L (ref 3.5–5.1)
Sodium: 137 mmol/L (ref 135–145)
Total Bilirubin: 0.5 mg/dL (ref <1.2)
Total Protein: 7.7 g/dL (ref 6.5–8.1)

## 2023-06-06 LAB — FOLATE: Folate: 9.8 ng/mL (ref >5.9)

## 2023-06-06 LAB — CBC WITH DIFFERENTIAL/PLATELET
Abs Immature Granulocytes: 0.01 10*3/uL (ref 0.00–0.07)
Basophils Absolute: 0 10*3/uL (ref 0.0–0.1)
Basophils Relative: 1 %
Eosinophils Absolute: 0.3 10*3/uL (ref 0.0–0.5)
Eosinophils Relative: 8 %
HCT: 30.5 % — ABNORMAL LOW (ref 36.0–46.0)
Hemoglobin: 9.5 g/dL — ABNORMAL LOW (ref 12.0–15.0)
Immature Granulocytes: 0 %
Lymphocytes Relative: 20 %
Lymphs Abs: 0.8 10*3/uL (ref 0.7–4.0)
MCH: 29 pg (ref 26.0–34.0)
MCHC: 31.1 g/dL (ref 30.0–36.0)
MCV: 93 fL (ref 80.0–100.0)
Monocytes Absolute: 0.4 10*3/uL (ref 0.1–1.0)
Monocytes Relative: 9 %
Neutro Abs: 2.5 10*3/uL (ref 1.7–7.7)
Neutrophils Relative %: 62 %
Platelets: 155 10*3/uL (ref 150–400)
RBC: 3.28 MIL/uL — ABNORMAL LOW (ref 3.87–5.11)
RDW: 14.4 % (ref 11.5–15.5)
WBC: 4 10*3/uL (ref 4.0–10.5)
nRBC: 0 % (ref 0.0–0.2)

## 2023-06-06 LAB — VITAMIN B12: Vitamin B-12: 196 pg/mL (ref 180–914)

## 2023-06-06 LAB — HEPATIC FUNCTION PANEL
ALT: 19 U/L (ref 0–44)
AST: 26 U/L (ref 15–41)
Albumin: 3.9 g/dL (ref 3.5–5.0)
Alkaline Phosphatase: 86 U/L (ref 38–126)
Bilirubin, Direct: <0.1 mg/dL (ref 0.0–0.2)
Total Bilirubin: 0.5 mg/dL (ref <1.2)
Total Protein: 7.7 g/dL (ref 6.5–8.1)

## 2023-06-06 LAB — IRON AND TIBC
Iron: 58 ug/dL (ref 28–170)
Saturation Ratios: 17 % (ref 10.4–31.8)
TIBC: 342 ug/dL (ref 250–450)
UIBC: 284 ug/dL

## 2023-06-06 LAB — FERRITIN: Ferritin: 41 ng/mL (ref 11–307)

## 2023-06-06 LAB — TSH: TSH: 0.486 u[IU]/mL (ref 0.350–4.500)

## 2023-06-06 MED ORDER — EPOETIN ALFA-EPBX 20000 UNIT/ML IJ SOLN
20000.0000 [IU] | Freq: Once | INTRAMUSCULAR | Status: AC
  Administered 2023-06-06: 20000 [IU] via SUBCUTANEOUS
  Filled 2023-06-06: qty 1

## 2023-06-06 MED ORDER — CYANOCOBALAMIN 1000 MCG/ML IJ SOLN
1000.0000 ug | Freq: Once | INTRAMUSCULAR | Status: AC
  Administered 2023-06-06: 1000 ug via INTRAMUSCULAR
  Filled 2023-06-06: qty 1

## 2023-06-06 NOTE — Progress Notes (Signed)
[facility] [address] [place] [address]    Clinic Day:  06/06/2023  Referring physician: Kerri Perches, MD  Patient Care Team: Kerri Perches, MD as PCP - General (Family Medicine) Jonelle Sidle, MD as PCP - Cardiology (Cardiology) Luis Abed, MD as Consulting Physician (Cardiology) Sallye Lat, MD as Consulting Physician (Optometry) Adam Phenix, DPM as Consulting Physician (Podiatry) Sharlene Dory, NP as Nurse Practitioner (Cardiology) Doreatha Massed, MD as Medical Oncologist (Hematology and Oncology)   ASSESSMENT & PLAN:   Assessment: 1.  Duodenal neuroendocrine tumor: - 04/27/2021 (duodenal mass biopsy): Well-differentiated carcinoid tumor, grade 1, Ki-67 1%. - Dotatate PET (05/2021): No focal activity within the duodenum or evidence of metastatic disease. - EGD/EUS (08/2021): Submucosal nodule found in the duodenum-consistent with previously biopsied duodenal NET.  An intramural subepithelial lesion found in the second part of the duodenum.  Lesion appears to originate from within the deep mucosa and submucosa.  She was not felt to be candidate for EMR. - Patient had traumatic experience after the EUS and did not want to pursue further options and declined a referral to [facility]. - Denies any diarrhea/flushing/wheezing/abdominal pain.  No obstructive symptoms.   2.  Normocytic anemia: - More than 5-year history of iron deficiency anemia secondary to GI bleed.  She underwent multiple EGDs and colonoscopies and 2 small bowel capsule endoscopies. - She reports taking iron tablet daily for the past 3 years.  Denies any ice pica. - Denies any BRBPR/melena.  She had blood transfusion 01/09/2023. -She also receives intermittent IV iron.  Last IV Feraheme was given on 03/01/2023.   3.  Social/family history: - Lives at home with her husband.  Independent of ADLs and IADLs.  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  No chemical exposure.  Smoked half pack per day starting at age 82 and quit in 2005. - Brother had colon cancer.  Older brother had liver cancer.    Plan: 1.  Normocytic anemia: - Multifactorial anemia from CKD, iron deficiency and chronic blood loss.  -No evidence of bleeding per patient. -She is status post several blood transfusions last given on 01/30/2023 for hemoglobin of 7.2. - Labs from 06/06/2023 show hemoglobin of 9.5.  Ferritin is 41 (369), iron saturation 17% and TIBC 342. -B12 level 196 (241).  -She received IV Feraheme last given on 03/01/2023.  Recommend 2 doses of IV Feraheme over the next few weeks. -She continues oral iron 325 mg daily with mild constipation. -Recommend increasing Retacrit given iron stores have improved from 10,000 to 20,000 units every 2 weeks. -Proceed with 20,000 units Retacrit today.   -Continue Retacrit every 2 weeks-hold for hemoglobin greater than 11.   2.  Duodenal carcinoid tumor: - Chromogranin (11/16/2022): 847.  Today's level is pending during dictation. - 24-hour urine 5-HIAA (11/21/2022): Normal - Reviewed dotatate PET scan from 12/06/2022: No evidence of neuroendocrine tumor or metastasis.  3.  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 -Previous workup showed elevated kappa lambda light chain with a normal ratio.  Immunofixation shows IgG kappa monoclonal protein. Calcium is normal and her creatinine is at baseline.  We will closely monitor at this time.  4.  B12 deficiency: -B12 level is on the lower end of normal at 196. -We discussed monthly B12 injections x 6 months.   PLAN SUMMARY: >> Increase Retacrit to 20,000 units every 2 weeks.  Hold Retacrit for hemoglobin 11 or higher. >> B12 injection today and monthly x 6 doses. >> Continue oral iron.   >> Recommend 2 additional doses of IV Feraheme- Mid/early Jan. . >> Recommend follow-up with lab work in 12 weeks, Retacrit/B12 and  see a provider.     No orders of the defined types were placed in this encounter.  I spent 25 minutes dedicated to the care of this patient (face-to-face and non-face-to-face) on the date of the encounter to include what is described in the assessment and plan.  Mauro Kaufmann, NP   12/5/202410:13 AM  CHIEF COMPLAINT:   Diagnosis: history of NET and anemia    Cancer Staging  No matching staging information was found for the patient.    Prior Therapy: none  Current Therapy:   retacrit injections, biweekly   HISTORY OF PRESENT ILLNESS:   Oncology History   No history exists.     INTERVAL HISTORY:   [name] [name] is a 82 y.o. female presenting to clinic today for follow up of history of NET and anemia. She was last seen in clinic on 02/04/2023 for follow-up.  She has been receiving Retacrit every few weeks since 01/04/2021.  She also receives IV iron intermittently.  Retacrit has increased from 3000 units to currently 10,000 units with improvement of her hemoglobin.  She last received IV Feraheme on 02/22/2023 and 03/01/2023.  Today, she reports feeling stable.  Denies any bleeding per rectum, hematochezia, melena, gum bleeding or epistaxis.  Appetite is not 100% energy levels are 75%.  She has chronic right knee pain and has received steroid injections which have helped some.  She uses a cane for ambulation.  She continues to take and tolerate iron supplements.  Has occasional constipation and will intermittently take a stool softener.  Has occasional chest pain secondary to atrial fibrillation.  She was recently taken off Xarelto and placed on aspirin 81 mg due to worsening anemia.   She denies any hospitalizations, surgeries or changes to her baseline health.  PAST MEDICAL HISTORY:   Past Medical History: Past Medical History:  Diagnosis Date   A-fib (HCC)    Blood transfusion without reported diagnosis    CAD (coronary artery disease)    Multivessel status post CABG October  2005   Carotid artery disease (HCC)    CKD (chronic kidney disease), stage III (HCC)    Essential hypertension    Glaucoma    Laser surgery 2010   Hyperlipidemia    Hypothyroidism    Iron deficiency anemia    Obesity     Surgical History: Past Surgical History:  Procedure Laterality Date   BIOPSY  04/27/2021   Procedure: BIOPSY;  Surgeon: Rachael Fee, MD;  Location: [facility];  Service: Endoscopy;;   COLONOSCOPY N/A 02/04/2013   Procedure: COLONOSCOPY;  Surgeon: Malissa Hippo, MD;  Location: [facility];  Service: Endoscopy;  Laterality: N/A;  1015   COLONOSCOPY N/A 12/21/2016   Procedure: COLONOSCOPY;  Surgeon: Malissa Hippo, MD;  [facility]: [facility];  Service: Endoscopy;  Laterality: N/A;   COLONOSCOPY WITH PROPOFOL N/A 03/22/2021   Procedure: COLONOSCOPY WITH PROPOFOL;  Surgeon: Malissa Hippo, MD;  [facility]: [facility];  Service: Endoscopy;  Laterality: N/A;  8:15   CORONARY ARTERY BYPASS GRAFT  October 2005   Dr. Cornelius Moras - LIMA to LAD, SVG to OM 2 and OM 3, SVG to PDA   ESOPHAGOGASTRODUODENOSCOPY N/A 12/21/2016   Procedure: ESOPHAGOGASTRODUODENOSCOPY (EGD);  Surgeon:  Malissa Hippo, MD;  [facility]: [facility];  Service: Endoscopy;  Laterality: N/A;  11:10   ESOPHAGOGASTRODUODENOSCOPY N/A 02/27/2018   Procedure: ESOPHAGOGASTRODUODENOSCOPY (EGD);  Surgeon: Malissa Hippo, MD;  [facility];  Service: Endoscopy;  Laterality: N/A;   ESOPHAGOGASTRODUODENOSCOPY (EGD) WITH PROPOFOL N/A 04/27/2021   Procedure: ESOPHAGOGASTRODUODENOSCOPY (EGD) WITH PROPOFOL;  Surgeon: Rachael Fee, MD;  Location: [facility];  Service: Endoscopy;  Laterality: N/A;   ESOPHAGOGASTRODUODENOSCOPY (EGD) WITH PROPOFOL N/A 08/17/2021   Procedure: ESOPHAGOGASTRODUODENOSCOPY (EGD) WITH PROPOFOL;  Surgeon: Meridee Score Netty Starring., MD;  Location: [facility] END[facility]COPY;  Service: Gastroenterology;  Laterality: N/A;   EUS N/A 04/27/2021   Procedure: UPPER ENDOSCOPIC ULTRASOUND (EUS)  RADIAL;  Surgeon: Rachael Fee, MD;  Location: [facility];  Service: Endoscopy;  Laterality: N/A;   EYE SURGERY  2010   laser treatment to both eyes for glaucoma   GIVENS CAPSULE STUDY N/A 03/07/2018   Procedure: GIVENS CAPSULE STUDY;  Surgeon: Malissa Hippo, MD;  [facility];  Service: Endoscopy;  Laterality: N/A;   GIVENS CAPSULE STUDY N/A 02/05/2020   Procedure: GIVENS CAPSULE STUDY;  Surgeon: Malissa Hippo, MD;  [facility];  Service: Endoscopy;  Laterality: N/A;  730   POLYPECTOMY  03/22/2021   Procedure: POLYPECTOMY;  Surgeon: Malissa Hippo, MD;  [facility]: [facility];  Service: Endoscopy;;   UPPER ESOPHAGEAL ENDOSCOPIC ULTRASOUND (EUS) N/A 08/17/2021   Procedure: UPPER ESOPHAGEAL ENDOSCOPIC ULTRASOUND (EUS);  Surgeon: Lemar Lofty., MD;  Location: [facility] ENDOSCOPY;  Service: Gastroenterology;  Laterality: N/A;    Social History: Social History   Socioeconomic History   Marital status: Married    Spouse name: Not on file   Number of children: Not on file   Years of education: Not on file   Highest education level: Not on file  Occupational History   Occupation: retired   Tobacco Use   Smoking status: Former    Current packs/day: 0.00    Average packs/day: 0.3 packs/day for 40.0 years (10.0 ttl pk-yrs)    Types: Cigarettes    Start date: 07/03/1963    Quit date: 07/03/2003    Years since quitting: 19.9    Passive exposure: Never   Smokeless tobacco: Never  Vaping Use   Vaping status: Never Used  Substance and Sexual Activity   Alcohol use: No    Alcohol/week: 0.0 standard drinks of alcohol   Drug use: No   Sexual activity: Never  Other Topics Concern   Not on file  Social History Narrative   Not on file   Social Determinants of Health   Financial Resource Strain: Low Risk  (12/19/2022)   Overall Financial Resource Strain (CARDIA)    Difficulty of Paying Living Expenses: Not hard at all  Food Insecurity: No Food Insecurity  (12/19/2022)   Hunger Vital Sign    Worried About Running Out of Food in the Last Year: Never true    Ran Out of Food in the Last Year: Never true  Transportation Needs: No Transportation Needs (12/19/2022)   PRAPARE - Administrator, Civil Service (Medical): No    Lack of Transportation (Non-Medical): No  Physical Activity: Insufficiently Active (12/19/2022)   Exercise Vital Sign    Days of Exercise per Week: 5 days    Minutes of Exercise per Session: 10 min  Stress: No Stress Concern Present (12/19/2022)   [employer] of Occupational Health - Occupational Stress Questionnaire    Feeling of  Stress : Not at all  Social Connections: Socially Integrated (12/19/2022)   Social Connection and Isolation Panel [NHANES]    Frequency of Communication with Friends and Family: More than three times a week    Frequency of Social Gatherings with Friends and Family: More than three times a week    Attends Religious Services: More than 4 times per year    Active Member of [employer] or Organizations: Yes    Attends Banker Meetings: More than 4 times per year    Marital Status: Married  Catering manager Violence: Not At Risk (12/19/2022)   Humiliation, Afraid, Rape, and Kick questionnaire    Fear of Current or Ex-Partner: No    Emotionally Abused: No    Physically Abused: No    Sexually Abused: No    Family History: Family History  Problem Relation Age of Onset   Aneurysm Mother    Hypertension Mother    Coronary artery disease Mother    Coronary artery disease Father    Diabetes Sister    Coronary artery disease Sister    Diverticulosis Sister    COPD Sister    Kidney disease Sister    Diabetes Sister    Hypertension Sister    Breast cancer Sister 92   Hearing loss Sister    Heart Problems Sister        pacemaker   Colon cancer Brother 64       lived 18 months afterwards   Liver cancer Brother 73   Diabetes Son    Pancreatic cancer Son    Esophageal cancer  Neg Hx    Inflammatory bowel disease Neg Hx    Stomach cancer Neg Hx     Current Medications:  Current Outpatient Medications:    ALLERGY RELIEF/INDOOR/OUTDOOR 10 MG tablet, TAKE 1 TABLET BY MOUTH DAILY, Disp: 120 tablet, Rfl: 1   amLODipine (NORVASC) 5 MG tablet, Take one tablet by mouth two times daily, Disp: 180 tablet, Rfl: 3   aspirin EC 81 MG tablet, Take 1 tablet (81 mg total) by mouth daily. Swallow whole., Disp: 90 tablet, Rfl: 4   chlorthalidone (HYGROTON) 25 MG tablet, Take 25 mg by mouth daily., Disp: , Rfl:    famotidine (PEPCID) 20 MG tablet, TAKE 1 TABLET BY MOUTH TWICE  DAILY, Disp: 120 tablet, Rfl: 5   ferrous sulfate 325 (65 FE) MG tablet, Take 325 mg by mouth daily with breakfast., Disp: , Rfl:    fish oil-omega-3 fatty acids 1000 MG capsule, Take 1 g by mouth daily., Disp: , Rfl:    fluticasone (FLONASE) 50 MCG/ACT nasal spray, USE 2 SPRAYS IN EACH NOSTRIL ONCE DAILY. SHAKE GENTLY BEFORE USING., Disp: 48 g, Rfl: 1   hydrALAZINE (APRESOLINE) 100 MG tablet, Take one tablet by mouth once daily, Disp: 90 tablet, Rfl: 3   latanoprost (XALATAN) 0.005 % ophthalmic solution, Place 1 drop into both eyes at bedtime., Disp: , Rfl:    levothyroxine (SYNTHROID) 100 MCG tablet, Take 1 tablet (100 mcg total) by mouth daily., Disp: 100 tablet, Rfl: 2   metoprolol succinate (TOPROL-XL) 50 MG 24 hr tablet, TAKE 1 TABLET BY MOUTH with OR immdiately following a meal, Disp: 90 tablet, Rfl: 1   nitroGLYCERIN (NITROSTAT) 0.4 MG SL tablet, DISSOLVE 1 TABLET UNDER THE  TONGUE EVERY 5 MINUTES AS NEEDED FOR CHEST PAIN. MAX OF 3 TABLETS IN 15 MINUTES. CALL 911 IF PAIN  PERSISTS., Disp: 100 tablet, Rfl: 2   Polyvinyl Alcohol-Povidone (REFRESH OP), Place  1 drop into both eyes at bedtime., Disp: , Rfl:    rosuvastatin (CRESTOR) 10 MG tablet, Take 1 tablet (10 mg total) by mouth daily., Disp: 90 tablet, Rfl: 3   SYSTANE ULTRA 0.4-0.3 % SOLN, Place 1 drop into both eyes daily as needed (dry eyes)., Disp: ,  Rfl:    telmisartan (MICARDIS) 20 MG tablet, Take 0.5 tablets (10 mg total) by mouth daily., Disp: 15 tablet, Rfl: 11   trolamine salicylate (ASPERCREME) 10 % cream, Apply 1 application topically as needed for muscle pain., Disp: , Rfl:    UNABLE TO FIND, Shower chair x 1  DX M54.36, Disp: 1 each, Rfl: 0   UNABLE TO FIND, Tdap x 1 fax vaccine record to [phone], Disp: 1 each, Rfl: 0 No current facility-administered medications for this visit.  Facility-Administered Medications Ordered in Other Visits:    cyanocobalamin (VITAMIN B12) injection 1,000 mcg, 1,000 mcg, Intramuscular, Once, Scheryl Sanborn E, NP   epoetin alfa-epbx (RETACRIT) injection 20,000 Units, 20,000 Units, Subcutaneous, Once, Brode Sculley, Renda Rolls, NP   Allergies: Allergies  Allergen Reactions   Tramadol Itching   Lisinopril Cough   Shrimp (Diagnostic)     Gout flares   Clonidine Derivatives Other (See Comments)    Zombie BP dropped    REVIEW OF SYSTEMS:   Review of Systems  Constitutional:  Positive for fatigue.  Cardiovascular:  Positive for chest pain (Atrial fibrillation).  Musculoskeletal:  Positive for arthralgias (Right knee).  Neurological:  Positive for numbness.     VITALS:   Blood pressure (!) 153/74, pulse (!) 59, temperature (!) 97.5 F (36.4 C), resp. rate 16, weight 139 lb 12.4 oz (63.4 kg), SpO2 97%.  Wt Readings from Last 3 Encounters:  06/06/23 139 lb 12.4 oz (63.4 kg)  05/21/23 144 lb (65.3 kg)  03/07/23 150 lb 9.6 oz (68.3 kg)    Body mass index is 26.41 kg/m.  Performance status (ECOG): 1 - Symptomatic but completely ambulatory  PHYSICAL EXAM:   Physical Exam Constitutional:      Appearance: Normal appearance.  Cardiovascular:     Rate and Rhythm: Normal rate. Rhythm irregular.  Pulmonary:     Effort: Pulmonary effort is normal.     Breath sounds: Normal breath sounds.  Abdominal:     General: Bowel sounds are normal.     Palpations: Abdomen is soft.  Musculoskeletal:         General: No swelling. Normal range of motion.  Neurological:     Mental Status: She is alert and oriented to person, place, and time. Mental status is at baseline.     LABS:      Latest Ref Rng & Units 06/06/2023    7:59 AM 05/16/2023    9:28 AM 04/18/2023    8:45 AM  CBC  WBC 4.0 - 10.5 K/uL 4.0  3.9  4.2   Hemoglobin 12.0 - 15.0 g/dL 9.5  9.2  09.8   Hematocrit 36.0 - 46.0 % 30.5  29.5  33.8   Platelets 150 - 400 K/uL 155  170  173       Latest Ref Rng & Units 06/06/2023    8:11 AM 06/06/2023    7:59 AM 04/04/2023    1:53 PM  CMP  Glucose 70 - 99 mg/dL  89  90   BUN 8 - 23 mg/dL  30  38   Creatinine 1.19 - 1.00 mg/dL  1.47  8.29   Sodium 562 - 145 mmol/L  137  140   Potassium 3.5 - 5.1 mmol/L  4.3  4.3   Chloride 98 - 111 mmol/L  109  108   CO2 22 - 32 mmol/L  21  26   Calcium 8.9 - 10.3 mg/dL  96.2  95.2    84.1   Total Protein 6.5 - 8.1 g/dL 7.7  7.7    Total Bilirubin <1.2 mg/dL 0.5  0.5    Alkaline Phos 38 - 126 U/L 86  84    AST 15 - 41 U/L 26  26    ALT 0 - 44 U/L 19  19       No results found for: "CEA1", "CEA" / No results found for: "CEA1", "CEA" No results found for: "PSA1" No results found for: "CAN199" No results found for: "CAN125"  Lab Results  Component Value Date   TOTALPROTELP 7.2 11/16/2022   TOTALPROTELP 6.9 11/16/2022   ALBUMINELP 3.5 11/16/2022   A1GS 0.3 11/16/2022   A2GS 0.7 11/16/2022   BETS 1.0 11/16/2022   GAMS 1.4 11/16/2022   MSPIKE Not Observed 11/16/2022   SPEI Comment 11/16/2022   Lab Results  Component Value Date   TIBC 342 06/06/2023   TIBC 313 03/07/2023   TIBC 362 02/04/2023   FERRITIN 41 06/06/2023   FERRITIN 369 (H) 03/07/2023   FERRITIN 31 02/04/2023   IRONPCTSAT 17 06/06/2023   IRONPCTSAT 36 (H) 03/07/2023   IRONPCTSAT 22 02/04/2023   Lab Results  Component Value Date   LDH 152 11/16/2022     STUDIES:   No results found.

## 2023-06-06 NOTE — Progress Notes (Signed)
B12 injection, Retacrit injection given per orders. Patient tolerated it well without problems. Vitals stable and discharged home from clinic via wheelchair. . Follow up as scheduled.

## 2023-06-07 LAB — KAPPA/LAMBDA LIGHT CHAINS
Kappa free light chain: 62.2 mg/L — ABNORMAL HIGH (ref 3.3–19.4)
Kappa, lambda light chain ratio: 1.71 — ABNORMAL HIGH (ref 0.26–1.65)
Lambda free light chains: 36.4 mg/L — ABNORMAL HIGH (ref 5.7–26.3)

## 2023-06-08 LAB — CHROMOGRANIN A: Chromogranin A (ng/mL): 592.2 ng/mL — ABNORMAL HIGH (ref 0.0–101.8)

## 2023-06-10 ENCOUNTER — Encounter: Payer: Self-pay | Admitting: Hematology

## 2023-06-10 LAB — METHYLMALONIC ACID, SERUM: Methylmalonic Acid, Quantitative: 186 nmol/L (ref 0–378)

## 2023-06-10 NOTE — Progress Notes (Signed)
Opened in error

## 2023-06-14 LAB — PROTEIN ELECTROPHORESIS, SERUM
A/G Ratio: 1.1 (ref 0.7–1.7)
Albumin ELP: 3.9 g/dL (ref 2.9–4.4)
Alpha-1-Globulin: 0.3 g/dL (ref 0.0–0.4)
Alpha-2-Globulin: 0.7 g/dL (ref 0.4–1.0)
Beta Globulin: 0.9 g/dL (ref 0.7–1.3)
Gamma Globulin: 1.7 g/dL (ref 0.4–1.8)
Globulin, Total: 3.5 g/dL (ref 2.2–3.9)
Total Protein ELP: 7.4 g/dL (ref 6.0–8.5)

## 2023-06-17 LAB — IMMUNOFIXATION ELECTROPHORESIS
IgA: 159 mg/dL (ref 64–422)
IgG (Immunoglobin G), Serum: 1571 mg/dL (ref 586–1602)
IgM (Immunoglobulin M), Srm: 121 mg/dL (ref 26–217)
Total Protein ELP: 7 g/dL (ref 6.0–8.5)

## 2023-06-18 ENCOUNTER — Telehealth: Payer: Self-pay

## 2023-06-18 ENCOUNTER — Other Ambulatory Visit: Payer: Self-pay

## 2023-06-18 DIAGNOSIS — D631 Anemia in chronic kidney disease: Secondary | ICD-10-CM

## 2023-06-18 MED ORDER — CLONAZEPAM 0.5 MG PO TABS
ORAL_TABLET | ORAL | 0 refills | Status: DC
Start: 2023-06-18 — End: 2023-12-19

## 2023-06-18 NOTE — Telephone Encounter (Signed)
I spoke with pt and she is aware that klonopin is prescribed Klomopin is prescribed  in limited amt She is aware

## 2023-06-18 NOTE — Telephone Encounter (Signed)
   Please advise ?     Copied from CRM [id number]. Topic: Clinical - Medication Question >> Jun 18, 2023  3:42 PM Fonda Kinder J wrote: Reason for CRM: Pt wants to know if Dr.Simpson could prescribe her a nerve pill. She doesn't know the name of the medication but she said her sister passed and she is unable to hold it together

## 2023-06-19 ENCOUNTER — Inpatient Hospital Stay: Payer: Medicare Other

## 2023-06-19 VITALS — BP 110/63 | HR 62 | Temp 97.0°F | Resp 18

## 2023-06-19 DIAGNOSIS — D500 Iron deficiency anemia secondary to blood loss (chronic): Secondary | ICD-10-CM

## 2023-06-19 DIAGNOSIS — D631 Anemia in chronic kidney disease: Secondary | ICD-10-CM

## 2023-06-19 DIAGNOSIS — Z7901 Long term (current) use of anticoagulants: Secondary | ICD-10-CM | POA: Diagnosis not present

## 2023-06-19 DIAGNOSIS — I129 Hypertensive chronic kidney disease with stage 1 through stage 4 chronic kidney disease, or unspecified chronic kidney disease: Secondary | ICD-10-CM | POA: Diagnosis not present

## 2023-06-19 DIAGNOSIS — I2510 Atherosclerotic heart disease of native coronary artery without angina pectoris: Secondary | ICD-10-CM | POA: Diagnosis not present

## 2023-06-19 DIAGNOSIS — Z885 Allergy status to narcotic agent status: Secondary | ICD-10-CM | POA: Diagnosis not present

## 2023-06-19 DIAGNOSIS — G8929 Other chronic pain: Secondary | ICD-10-CM | POA: Diagnosis not present

## 2023-06-19 DIAGNOSIS — M25561 Pain in right knee: Secondary | ICD-10-CM | POA: Diagnosis not present

## 2023-06-19 DIAGNOSIS — E039 Hypothyroidism, unspecified: Secondary | ICD-10-CM | POA: Diagnosis not present

## 2023-06-19 DIAGNOSIS — K5900 Constipation, unspecified: Secondary | ICD-10-CM | POA: Diagnosis not present

## 2023-06-19 DIAGNOSIS — E538 Deficiency of other specified B group vitamins: Secondary | ICD-10-CM | POA: Diagnosis not present

## 2023-06-19 DIAGNOSIS — C7A010 Malignant carcinoid tumor of the duodenum: Secondary | ICD-10-CM | POA: Diagnosis not present

## 2023-06-19 DIAGNOSIS — Z888 Allergy status to other drugs, medicaments and biological substances status: Secondary | ICD-10-CM | POA: Diagnosis not present

## 2023-06-19 DIAGNOSIS — N1832 Chronic kidney disease, stage 3b: Secondary | ICD-10-CM | POA: Diagnosis not present

## 2023-06-19 DIAGNOSIS — K922 Gastrointestinal hemorrhage, unspecified: Secondary | ICD-10-CM | POA: Diagnosis not present

## 2023-06-19 DIAGNOSIS — D472 Monoclonal gammopathy: Secondary | ICD-10-CM | POA: Diagnosis not present

## 2023-06-19 DIAGNOSIS — R079 Chest pain, unspecified: Secondary | ICD-10-CM | POA: Diagnosis not present

## 2023-06-19 DIAGNOSIS — Z79899 Other long term (current) drug therapy: Secondary | ICD-10-CM | POA: Diagnosis not present

## 2023-06-19 DIAGNOSIS — E785 Hyperlipidemia, unspecified: Secondary | ICD-10-CM | POA: Diagnosis not present

## 2023-06-19 DIAGNOSIS — I4891 Unspecified atrial fibrillation: Secondary | ICD-10-CM | POA: Diagnosis not present

## 2023-06-19 DIAGNOSIS — Z87891 Personal history of nicotine dependence: Secondary | ICD-10-CM | POA: Diagnosis not present

## 2023-06-19 DIAGNOSIS — Z8379 Family history of other diseases of the digestive system: Secondary | ICD-10-CM | POA: Diagnosis not present

## 2023-06-19 LAB — CBC WITH DIFFERENTIAL/PLATELET
Abs Immature Granulocytes: 0 10*3/uL (ref 0.00–0.07)
Basophils Absolute: 0.1 10*3/uL (ref 0.0–0.1)
Basophils Relative: 1 %
Eosinophils Absolute: 0.3 10*3/uL (ref 0.0–0.5)
Eosinophils Relative: 7 %
HCT: 29.7 % — ABNORMAL LOW (ref 36.0–46.0)
Hemoglobin: 9.4 g/dL — ABNORMAL LOW (ref 12.0–15.0)
Immature Granulocytes: 0 %
Lymphocytes Relative: 20 %
Lymphs Abs: 0.9 10*3/uL (ref 0.7–4.0)
MCH: 29.7 pg (ref 26.0–34.0)
MCHC: 31.6 g/dL (ref 30.0–36.0)
MCV: 93.7 fL (ref 80.0–100.0)
Monocytes Absolute: 0.5 10*3/uL (ref 0.1–1.0)
Monocytes Relative: 11 %
Neutro Abs: 2.7 10*3/uL (ref 1.7–7.7)
Neutrophils Relative %: 61 %
Platelets: 204 10*3/uL (ref 150–400)
RBC: 3.17 MIL/uL — ABNORMAL LOW (ref 3.87–5.11)
RDW: 14.8 % (ref 11.5–15.5)
WBC: 4.4 10*3/uL (ref 4.0–10.5)
nRBC: 0 % (ref 0.0–0.2)

## 2023-06-19 LAB — SAMPLE TO BLOOD BANK

## 2023-06-19 MED ORDER — EPOETIN ALFA-EPBX 20000 UNIT/ML IJ SOLN
20000.0000 [IU] | Freq: Once | INTRAMUSCULAR | Status: AC
Start: 2023-06-19 — End: 2023-06-19
  Administered 2023-06-19: 20000 [IU] via SUBCUTANEOUS
  Filled 2023-06-19: qty 1

## 2023-06-19 NOTE — Patient Instructions (Signed)
[facility] [place] - [facility]  Discharge Instructions: Thank you for choosing [facility] to provide your oncology and hematology care.  If you have a lab appointment with the Cancer Center - please note that after April 8th, 2024, all labs will be drawn in the cancer center.  You do not have to check in or register with the main entrance as you have in the past but will complete your check-in in the cancer center.  Wear comfortable clothing and clothing appropriate for easy access to any Portacath or PICC line.   We strive to give you quality time with your provider. You may need to reschedule your appointment if you arrive late (15 or more minutes).  Arriving late affects you and other patients whose appointments are after yours.  Also, if you miss three or more appointments without notifying the office, you may be dismissed from the clinic at the provider's discretion.      For prescription refill requests, have your pharmacy contact our office and allow 72 hours for refills to be completed.    Today you received the following retacrit, return as scheduled.   To help prevent nausea and vomiting after your treatment, we encourage you to take your nausea medication as directed.  BELOW ARE SYMPTOMS THAT SHOULD BE REPORTED IMMEDIATELY: *FEVER GREATER THAN 100.4 F (38 C) OR HIGHER *CHILLS OR SWEATING *NAUSEA AND VOMITING THAT IS NOT CONTROLLED WITH YOUR NAUSEA MEDICATION *UNUSUAL SHORTNESS OF BREATH *UNUSUAL BRUISING OR BLEEDING *URINARY PROBLEMS (pain or burning when urinating, or frequent urination) *BOWEL PROBLEMS (unusual diarrhea, constipation, pain near the anus) TENDERNESS IN MOUTH AND THROAT WITH OR WITHOUT PRESENCE OF ULCERS (sore throat, sores in mouth, or a toothache) UNUSUAL RASH, SWELLING OR PAIN  UNUSUAL VAGINAL DISCHARGE OR ITCHING   Items with * indicate a potential emergency and should be followed up as soon as possible or  go to the Emergency Department if any problems should occur.  Please show the CHEMOTHERAPY ALERT CARD or IMMUNOTHERAPY ALERT CARD at check-in to the Emergency Department and triage nurse.  Should you have questions after your visit or need to cancel or reschedule your appointment, please contact [facility] [place] - A DEPT OF Eligha Bridegroom [facility] [phone]  and follow the prompts.  Office hours are 8:00 a.m. to 4:30 p.m. Monday - Friday. Please note that voicemails left after 4:00 p.m. may not be returned until the following business day.  We are closed weekends and major holidays. You have access to a nurse at all times for urgent questions. Please call the main number to the clinic [phone] and follow the prompts.  For any non-urgent questions, you may also contact your provider using MyChart. We now offer e-Visits for anyone 30 and older to request care online for non-urgent symptoms. For details visit [email or web address].   Also download the MyChart app! Go to the app store, search "MyChart", open the app, select [place], and log in with your MyChart username and password.

## 2023-06-19 NOTE — Progress Notes (Signed)
Patient presents today for Retacrit injection. Hemoglobin reviewed prior to administration. VSS tolerated without incident or complaint. See MAR for details. Patient stable during and after injection. Patient discharged in satisfactory condition with no s/s of distress noted.  

## 2023-07-02 ENCOUNTER — Other Ambulatory Visit: Payer: Self-pay

## 2023-07-02 DIAGNOSIS — D631 Anemia in chronic kidney disease: Secondary | ICD-10-CM

## 2023-07-04 ENCOUNTER — Inpatient Hospital Stay: Payer: Medicare Other | Attending: Hematology

## 2023-07-04 ENCOUNTER — Inpatient Hospital Stay: Payer: Medicare Other

## 2023-07-04 DIAGNOSIS — I129 Hypertensive chronic kidney disease with stage 1 through stage 4 chronic kidney disease, or unspecified chronic kidney disease: Secondary | ICD-10-CM | POA: Insufficient documentation

## 2023-07-04 DIAGNOSIS — Z79899 Other long term (current) drug therapy: Secondary | ICD-10-CM | POA: Insufficient documentation

## 2023-07-04 DIAGNOSIS — D631 Anemia in chronic kidney disease: Secondary | ICD-10-CM | POA: Insufficient documentation

## 2023-07-04 DIAGNOSIS — E785 Hyperlipidemia, unspecified: Secondary | ICD-10-CM | POA: Insufficient documentation

## 2023-07-04 DIAGNOSIS — C7A010 Malignant carcinoid tumor of the duodenum: Secondary | ICD-10-CM | POA: Insufficient documentation

## 2023-07-04 DIAGNOSIS — N1832 Chronic kidney disease, stage 3b: Secondary | ICD-10-CM | POA: Insufficient documentation

## 2023-07-10 ENCOUNTER — Other Ambulatory Visit: Payer: Self-pay | Admitting: Family Medicine

## 2023-07-16 ENCOUNTER — Inpatient Hospital Stay: Payer: Medicare Other

## 2023-07-16 VITALS — BP 125/75 | HR 62 | Temp 97.3°F | Resp 16

## 2023-07-16 DIAGNOSIS — I129 Hypertensive chronic kidney disease with stage 1 through stage 4 chronic kidney disease, or unspecified chronic kidney disease: Secondary | ICD-10-CM | POA: Diagnosis not present

## 2023-07-16 DIAGNOSIS — C7A010 Malignant carcinoid tumor of the duodenum: Secondary | ICD-10-CM | POA: Diagnosis not present

## 2023-07-16 DIAGNOSIS — N1832 Chronic kidney disease, stage 3b: Secondary | ICD-10-CM | POA: Diagnosis not present

## 2023-07-16 DIAGNOSIS — Z79899 Other long term (current) drug therapy: Secondary | ICD-10-CM | POA: Diagnosis not present

## 2023-07-16 DIAGNOSIS — D631 Anemia in chronic kidney disease: Secondary | ICD-10-CM | POA: Diagnosis not present

## 2023-07-16 DIAGNOSIS — D500 Iron deficiency anemia secondary to blood loss (chronic): Secondary | ICD-10-CM

## 2023-07-16 DIAGNOSIS — E785 Hyperlipidemia, unspecified: Secondary | ICD-10-CM | POA: Diagnosis not present

## 2023-07-16 MED ORDER — ACETAMINOPHEN 325 MG PO TABS
650.0000 mg | ORAL_TABLET | Freq: Once | ORAL | Status: AC
Start: 2023-07-16 — End: 2023-07-16
  Administered 2023-07-16: 650 mg via ORAL
  Filled 2023-07-16: qty 2

## 2023-07-16 MED ORDER — CYANOCOBALAMIN 1000 MCG/ML IJ SOLN: 1000.0000 ug | Freq: Once | INTRAMUSCULAR | Status: DC

## 2023-07-16 MED ORDER — SODIUM CHLORIDE 0.9 % IV SOLN: Freq: Once | INTRAVENOUS | Status: AC

## 2023-07-16 MED ORDER — CETIRIZINE HCL 10 MG PO TABS
10.0000 mg | ORAL_TABLET | Freq: Once | ORAL | Status: DC
Start: 2023-07-16 — End: 2023-07-16

## 2023-07-16 MED ORDER — FERUMOXYTOL INJECTION 510 MG/17 ML
510.0000 mg | Freq: Once | INTRAVENOUS | Status: AC
  Administered 2023-07-16: 510 mg via INTRAVENOUS
  Filled 2023-07-16: qty 510

## 2023-07-16 NOTE — Progress Notes (Signed)
 Patient presents today for Feraheme  infusion per providers order.  Vital signs WNL.  Patient took Zyrtec  at home prior to appt.    Peripheral IV started and blood return noted pre and post infusion.  Stable during infusion without adverse affects.  Vital signs stable.  No complaints at this time.  Discharge from clinic ambulatory in stable condition.  Alert and oriented X 3.  Follow up with [facility] as scheduled.

## 2023-07-16 NOTE — Patient Instructions (Signed)

## 2023-07-18 ENCOUNTER — Inpatient Hospital Stay: Payer: Medicare Other

## 2023-07-18 VITALS — BP 110/64 | HR 67 | Temp 98.1°F | Resp 19

## 2023-07-18 DIAGNOSIS — Z79899 Other long term (current) drug therapy: Secondary | ICD-10-CM | POA: Diagnosis not present

## 2023-07-18 DIAGNOSIS — I129 Hypertensive chronic kidney disease with stage 1 through stage 4 chronic kidney disease, or unspecified chronic kidney disease: Secondary | ICD-10-CM | POA: Diagnosis not present

## 2023-07-18 DIAGNOSIS — N1832 Chronic kidney disease, stage 3b: Secondary | ICD-10-CM | POA: Diagnosis not present

## 2023-07-18 DIAGNOSIS — C7A010 Malignant carcinoid tumor of the duodenum: Secondary | ICD-10-CM | POA: Diagnosis not present

## 2023-07-18 DIAGNOSIS — D631 Anemia in chronic kidney disease: Secondary | ICD-10-CM | POA: Diagnosis not present

## 2023-07-18 DIAGNOSIS — E785 Hyperlipidemia, unspecified: Secondary | ICD-10-CM | POA: Diagnosis not present

## 2023-07-18 DIAGNOSIS — D500 Iron deficiency anemia secondary to blood loss (chronic): Secondary | ICD-10-CM

## 2023-07-18 LAB — CBC WITH DIFFERENTIAL/PLATELET
Abs Immature Granulocytes: 0.01 10*3/uL (ref 0.00–0.07)
Basophils Absolute: 0 10*3/uL (ref 0.0–0.1)
Basophils Relative: 1 %
Eosinophils Absolute: 0.4 10*3/uL (ref 0.0–0.5)
Eosinophils Relative: 9 %
HCT: 29.7 % — ABNORMAL LOW (ref 36.0–46.0)
Hemoglobin: 9.2 g/dL — ABNORMAL LOW (ref 12.0–15.0)
Immature Granulocytes: 0 %
Lymphocytes Relative: 19 %
Lymphs Abs: 0.8 10*3/uL (ref 0.7–4.0)
MCH: 29.3 pg (ref 26.0–34.0)
MCHC: 31 g/dL (ref 30.0–36.0)
MCV: 94.6 fL (ref 80.0–100.0)
Monocytes Absolute: 0.4 10*3/uL (ref 0.1–1.0)
Monocytes Relative: 9 %
Neutro Abs: 2.7 10*3/uL (ref 1.7–7.7)
Neutrophils Relative %: 62 %
Platelets: 158 10*3/uL (ref 150–400)
RBC: 3.14 MIL/uL — ABNORMAL LOW (ref 3.87–5.11)
RDW: 14.1 % (ref 11.5–15.5)
WBC: 4.3 10*3/uL (ref 4.0–10.5)
nRBC: 0 % (ref 0.0–0.2)

## 2023-07-18 LAB — SAMPLE TO BLOOD BANK

## 2023-07-18 MED ORDER — EPOETIN ALFA-EPBX 20000 UNIT/ML IJ SOLN
20000.0000 [IU] | Freq: Once | INTRAMUSCULAR | Status: AC
  Administered 2023-07-18: 20000 [IU] via SUBCUTANEOUS
  Filled 2023-07-18: qty 1

## 2023-07-18 NOTE — Progress Notes (Signed)
Hemoglobin today is 9.2.  We will proceed with Retacrit injection per provider orders.   Patient tolerated injection with no complaints voiced.  Site clean and dry with no bruising or swelling noted.  No complaints of pain.  Discharged with vital signs stable and no signs or symptoms of distress noted.

## 2023-07-18 NOTE — Patient Instructions (Signed)
 [facility] [place] - [facility]  Discharge Instructions: Thank you for choosing [facility] to provide your oncology and hematology care.  If you have a lab appointment with the Cancer Center - please note that after April 8th, 2024, all labs will be drawn in the cancer center.  You do not have to check in or register with the main entrance as you have in the past but will complete your check-in in the cancer center.  Wear comfortable clothing and clothing appropriate for easy access to any Portacath or PICC line.   We strive to give you quality time with your provider. You may need to reschedule your appointment if you arrive late (15 or more minutes).  Arriving late affects you and other patients whose appointments are after yours.  Also, if you miss three or more appointments without notifying the office, you may be dismissed from the clinic at the provider's discretion.      For prescription refill requests, have your pharmacy contact our office and allow 72 hours for refills to be completed.    Today you received the following:  Retacrit.  Epoetin Alfa Injection What is this medication? EPOETIN ALFA (e POE e tin AL fa) treats low levels of red blood cells (anemia) caused by kidney disease, chemotherapy, or HIV medications. It can also be used in people who are at risk for blood loss during surgery. It works by Systems analyst make more red blood cells, which reduces the need for blood transfusions. This medicine may be used for other purposes; ask your health care provider or pharmacist if you have questions. COMMON BRAND NAME(S): Epogen, Procrit, Retacrit What should I tell my care team before I take this medication? They need to know if you have any of these conditions: Blood clots Cancer Heart disease High blood pressure On dialysis Seizures Stroke An unusual or allergic reaction to epoetin alfa, albumin, benzyl alcohol, other  medications, foods, dyes, or preservatives Pregnant or trying to get pregnant Breast-feeding How should I use this medication? This medication is injected into a vein or under the skin. It is usually given by your care team in a hospital or clinic setting. It may also be given at home. If you get this medication at home, you will be taught how to prepare and give it. Use exactly as directed. Take it as directed on the prescription label at the same time every day. Keep taking it unless your care team tells you to stop. It is important that you put your used needles and syringes in a special sharps container. Do not put them in a trash can. If you do not have a sharps container, call your pharmacist or care team to get one. A special MedGuide will be given to you by the pharmacist with each prescription and refill. Be sure to read this information carefully each time. Talk to your care team about the use of this medication in children. While this medication may be used in children as young as 1 month of age for selected conditions, precautions do apply. Overdosage: If you think you have taken too much of this medicine contact a poison control center or emergency room at once. NOTE: This medicine is only for you. Do not share this medicine with others. What if I miss a dose? If you miss a dose, take it as soon as you can. If it is almost time for your next  dose, take only that dose. Do not take double or extra doses. What may interact with this medication? Darbepoetin alfa Methoxy polyethylene glycol-epoetin beta This list may not describe all possible interactions. Give your health care provider a list of all the medicines, herbs, non-prescription drugs, or dietary supplements you use. Also tell them if you smoke, drink alcohol, or use illegal drugs. Some items may interact with your medicine. What should I watch for while using this medication? Visit your care team for regular checks on your  progress. Check your blood pressure as directed. Know what your blood pressure should be and when to contact your care team. Your condition will be monitored carefully while you are receiving this medication. You may need blood work while taking this medication. What side effects may I notice from receiving this medication? Side effects that you should report to your care team as soon as possible: Allergic reactions--skin rash, itching, hives, swelling of the face, lips, tongue, or throat Blood clot--pain, swelling, or warmth in the leg, shortness of breath, chest pain Heart attack--pain or tightness in the chest, shoulders, arms, or jaw, nausea, shortness of breath, cold or clammy skin, feeling faint or lightheaded Increase in blood pressure Rash, fever, and swollen lymph nodes Redness, blistering, peeling, or loosening of the skin, including inside the mouth Seizures Stroke--sudden numbness or weakness of the face, arm, or leg, trouble speaking, confusion, trouble walking, loss of balance or coordination, dizziness, severe headache, change in vision Side effects that usually do not require medical attention (report to your care team if they continue or are bothersome): Bone, joint, or muscle pain Cough Headache Nausea Pain, redness, or irritation at injection site This list may not describe all possible side effects. Call your doctor for medical advice about side effects. You may report side effects to FDA at 1-800-FDA-1088. Where should I keep my medication? Keep out of the reach of children and pets. Store in a refrigerator. Do not freeze. Do not shake. Protect from light. Keep this medication in the original container until you are ready to take it. See product for storage information. Get rid of any unused medication after the expiration date. To get rid of medications that are no longer needed or have expired: Take the medication to a medication take-back program. Check with your  pharmacy or law enforcement to find a location. If you cannot return the medication, ask your pharmacist or care team how to get rid of the medication safely. NOTE: This sheet is a summary. It may not cover all possible information. If you have questions about this medicine, talk to your doctor, pharmacist, or health care provider.  2024 Elsevier/Gold Standard (2021-10-20 00:00:00)     To help prevent nausea and vomiting after your treatment, we encourage you to take your nausea medication as directed.  BELOW ARE SYMPTOMS THAT SHOULD BE REPORTED IMMEDIATELY: *FEVER GREATER THAN 100.4 F (38 C) OR HIGHER *CHILLS OR SWEATING *NAUSEA AND VOMITING THAT IS NOT CONTROLLED WITH YOUR NAUSEA MEDICATION *UNUSUAL SHORTNESS OF BREATH *UNUSUAL BRUISING OR BLEEDING *URINARY PROBLEMS (pain or burning when urinating, or frequent urination) *BOWEL PROBLEMS (unusual diarrhea, constipation, pain near the anus) TENDERNESS IN MOUTH AND THROAT WITH OR WITHOUT PRESENCE OF ULCERS (sore throat, sores in mouth, or a toothache) UNUSUAL RASH, SWELLING OR PAIN  UNUSUAL VAGINAL DISCHARGE OR ITCHING   Items with * indicate a potential emergency and should be followed up as soon as possible or go to the Emergency Department if any problems should  occur.  Please show the CHEMOTHERAPY ALERT CARD or IMMUNOTHERAPY ALERT CARD at check-in to the Emergency Department and triage nurse.  Should you have questions after your visit or need to cancel or reschedule your appointment, please contact [facility] [place] - A DEPT OF [facility] Brideg[facility] [phone]  and follow the prompts.  Office hours are 8:00 a.m. to 4:30 p.m. Monday - Friday. Please note that voicemails left after 4:00 p.m. may not be returned until the following business day.  We are closed weekends and major holidays. You have access to a nurse at all times for urgent questions. Please call the main number to the clinic [phone] and follow the  prompts.  For any non-urgent questions, you may also contact your provider using MyChart. We now offer e-Visits for anyone 42 and older to request care online for non-urgent symptoms. For details visit [email or web address].   Also download the MyChart app! Go to the app store, search "MyChart", open the app, select [place], and log in with your MyChart username and password.

## 2023-07-23 ENCOUNTER — Inpatient Hospital Stay: Payer: Medicare Other

## 2023-07-27 ENCOUNTER — Other Ambulatory Visit: Payer: Self-pay | Admitting: Nurse Practitioner

## 2023-07-31 ENCOUNTER — Other Ambulatory Visit: Payer: Self-pay

## 2023-07-31 ENCOUNTER — Encounter: Payer: Self-pay | Admitting: Cardiology

## 2023-07-31 ENCOUNTER — Ambulatory Visit: Payer: Medicare Other | Attending: Cardiology | Admitting: Cardiology

## 2023-07-31 VITALS — BP 110/60 | HR 72 | Ht 61.0 in | Wt 146.2 lb

## 2023-07-31 DIAGNOSIS — I10 Essential (primary) hypertension: Secondary | ICD-10-CM | POA: Diagnosis not present

## 2023-07-31 DIAGNOSIS — E782 Mixed hyperlipidemia: Secondary | ICD-10-CM | POA: Diagnosis not present

## 2023-07-31 DIAGNOSIS — D649 Anemia, unspecified: Secondary | ICD-10-CM

## 2023-07-31 DIAGNOSIS — I25119 Atherosclerotic heart disease of native coronary artery with unspecified angina pectoris: Secondary | ICD-10-CM | POA: Diagnosis not present

## 2023-07-31 DIAGNOSIS — I6523 Occlusion and stenosis of bilateral carotid arteries: Secondary | ICD-10-CM

## 2023-07-31 DIAGNOSIS — I4821 Permanent atrial fibrillation: Secondary | ICD-10-CM

## 2023-07-31 DIAGNOSIS — N1832 Chronic kidney disease, stage 3b: Secondary | ICD-10-CM

## 2023-07-31 DIAGNOSIS — D500 Iron deficiency anemia secondary to blood loss (chronic): Secondary | ICD-10-CM

## 2023-07-31 NOTE — Patient Instructions (Signed)

## 2023-07-31 NOTE — Progress Notes (Signed)
Cardiology Office Note  Date: 07/31/2023   ID: [name] [name], [name] [name] 19-Mar-1941, MRN [id number]  History of Present Illness: [name] [name] is an 83 y.o. female last seen in July 2024 by Ms. Philis Nettle NP, I reviewed the note.  She is here for a routine visit.  I reviewed the chart since our last encounter.  She does not report any angina, no sense of palpitations or increasing dyspnea with typical activities.  She continues to follow with hematology for treatment of iron deficiency anemia.  I reviewed her medications.  Current regimen includes aspirin, Norvasc, chlorthalidone, hydralazine, Toprol-XL, Crestor, Micardis, and as needed nitroglycerin.  Her last LDL was 56 in September 2024.  She is due for follow-up carotid Dopplers next month.  Physical Exam: VS:  BP 110/60   Pulse 72   Ht 5\' 1"  (1.549 m)   Wt 146 lb 3.2 oz (66.3 kg)   SpO2 98%   BMI 27.62 kg/m , BMI Body mass index is 27.62 kg/m.  Wt Readings from Last 3 Encounters:  07/31/23 146 lb 3.2 oz (66.3 kg)  06/06/23 139 lb 12.4 oz (63.4 kg)  05/21/23 144 lb (65.3 kg)    General: Patient appears comfortable at rest. HEENT: Conjunctiva and lids normal. Neck: Supple, no elevated JVP, bilateral carotid bruits. Lungs: Clear to auscultation, nonlabored breathing at rest. Cardiac: Irregular irregular, 2/6 systolic murmur, no gallop. Extremities: No pitting edema.  ECG:  An ECG dated 01/21/2023 was personally reviewed today and demonstrated:  Rate controlled atrial fibrillation with nonspecific ST-T changes.  Labwork: 06/06/2023: ALT 19; AST 26; BUN 30; Creatinine, Ser 1.44; Potassium 4.3; Sodium 137; TSH 0.486 07/18/2023: Hemoglobin 9.2; Platelets 158     Component Value Date/Time   CHOL 112 03/21/2023 0757   CHOL 116 01/15/2023 1613   TRIG 13 03/21/2023 0757   HDL 53 03/21/2023 0757   HDL 51 01/15/2023 1613   CHOLHDL 2.1 03/21/2023 0757   VLDL 3 03/21/2023 0757   LDLCALC 56 03/21/2023 0757   LDLCALC 44 01/15/2023  1613   LDLCALC 61 07/21/2019 0938   Other Studies Reviewed Today:  No interval cardiac testing for review today.  Assessment and Plan:  1.  Multivessel CAD status post CABG in 2005 with LIMA to LAD, SVG to OM 2 and OM 3, and SVG to PDA.  LVEF 70 to 75% by echocardiogram in May 2024.  She reports no angina or nitroglycerin use.  Continue current regimen including Crestor.  2.  Primary hypertension.  Blood pressure is well-controlled today.  Continue Micardis, Toprol-XL, hydralazine, Norvasc and chlorthalidone  3.  CKD stage IIIb.  Creatinine 1.44 in December 2024.  4.  Carotid artery disease.  Carotid Dopplers in February 2024 revealed 60 to 79% RICA stenosis and 40 to 59% LICA stenosis.  She is asymptomatic.  Due for follow-up carotid Dopplers next month.  Continue aspirin and Crestor.  5.  Persistent atrial fibrillation managing as permanent atrial fibrillation, CHA2DS2-VASc score is 5.  Rhythm diagnosed in March 2024.  Per chart review patient experienced recurrent bleeding while on Xarelto, anticoagulation was discontinued in July 2024 and she has been on aspirin.  Not felt to be optimal candidate for Watchman device.  She is asymptomatic in terms of palpitations and has adequate heart rate control on Toprol-XL.  6.  Mixed hyperlipidemia.  LDL 56 in September 2024.  Continue Crestor.  Disposition:  Follow up  6 months.  Signed, Jonelle Sidle, M.D., F.A.C.C. [facility] at  [facility]

## 2023-08-01 ENCOUNTER — Inpatient Hospital Stay: Payer: Medicare Other

## 2023-08-01 VITALS — BP 106/55 | HR 53 | Temp 97.5°F | Resp 17

## 2023-08-01 DIAGNOSIS — D631 Anemia in chronic kidney disease: Secondary | ICD-10-CM | POA: Diagnosis not present

## 2023-08-01 DIAGNOSIS — N1832 Chronic kidney disease, stage 3b: Secondary | ICD-10-CM | POA: Diagnosis not present

## 2023-08-01 DIAGNOSIS — I129 Hypertensive chronic kidney disease with stage 1 through stage 4 chronic kidney disease, or unspecified chronic kidney disease: Secondary | ICD-10-CM | POA: Diagnosis not present

## 2023-08-01 DIAGNOSIS — D649 Anemia, unspecified: Secondary | ICD-10-CM

## 2023-08-01 DIAGNOSIS — E785 Hyperlipidemia, unspecified: Secondary | ICD-10-CM | POA: Diagnosis not present

## 2023-08-01 DIAGNOSIS — Z79899 Other long term (current) drug therapy: Secondary | ICD-10-CM | POA: Diagnosis not present

## 2023-08-01 DIAGNOSIS — C7A010 Malignant carcinoid tumor of the duodenum: Secondary | ICD-10-CM | POA: Diagnosis not present

## 2023-08-01 DIAGNOSIS — D500 Iron deficiency anemia secondary to blood loss (chronic): Secondary | ICD-10-CM

## 2023-08-01 LAB — CBC WITH DIFFERENTIAL/PLATELET
Abs Immature Granulocytes: 0.01 10*3/uL (ref 0.00–0.07)
Basophils Absolute: 0.1 10*3/uL (ref 0.0–0.1)
Basophils Relative: 1 %
Eosinophils Absolute: 0.3 10*3/uL (ref 0.0–0.5)
Eosinophils Relative: 10 %
HCT: 30.5 % — ABNORMAL LOW (ref 36.0–46.0)
Hemoglobin: 9.6 g/dL — ABNORMAL LOW (ref 12.0–15.0)
Immature Granulocytes: 0 %
Lymphocytes Relative: 22 %
Lymphs Abs: 0.8 10*3/uL (ref 0.7–4.0)
MCH: 30.3 pg (ref 26.0–34.0)
MCHC: 31.5 g/dL (ref 30.0–36.0)
MCV: 96.2 fL (ref 80.0–100.0)
Monocytes Absolute: 0.4 10*3/uL (ref 0.1–1.0)
Monocytes Relative: 11 %
Neutro Abs: 2 10*3/uL (ref 1.7–7.7)
Neutrophils Relative %: 56 %
Platelets: 162 10*3/uL (ref 150–400)
RBC: 3.17 MIL/uL — ABNORMAL LOW (ref 3.87–5.11)
RDW: 15.2 % (ref 11.5–15.5)
WBC: 3.5 10*3/uL — ABNORMAL LOW (ref 4.0–10.5)
nRBC: 0 % (ref 0.0–0.2)

## 2023-08-01 LAB — SAMPLE TO BLOOD BANK

## 2023-08-01 MED ORDER — CYANOCOBALAMIN 1000 MCG/ML IJ SOLN
1000.0000 ug | Freq: Once | INTRAMUSCULAR | Status: AC
  Administered 2023-08-01: 1000 ug via INTRAMUSCULAR
  Filled 2023-08-01: qty 1

## 2023-08-01 MED ORDER — EPOETIN ALFA-EPBX 20000 UNIT/ML IJ SOLN
20000.0000 [IU] | Freq: Once | INTRAMUSCULAR | Status: AC
Start: 2023-08-01 — End: 2023-08-01
  Administered 2023-08-01: 20000 [IU] via SUBCUTANEOUS
  Filled 2023-08-01: qty 1

## 2023-08-01 NOTE — Patient Instructions (Signed)
[facility] [place] - [facility] DEPT OF [facility]  Discharge Instructions: Thank you for choosing [facility] to provide your oncology and hematology care.  If you have a lab appointment with the Cancer Center - please note that after April 8th, 2024, all labs will be drawn in the cancer center.  You do not have to check in or register with the main entrance as you have in the past but will complete your check-in in the cancer center.  Wear comfortable clothing and clothing appropriate for easy access to any Portacath or PICC line.   We strive to give you quality time with your provider. You may need to reschedule your appointment if you arrive late (15 or more minutes).  Arriving late affects you and other patients whose appointments are after yours.  Also, if you miss three or more appointments without notifying the office, you may be dismissed from the clinic at the provider's discretion.      For prescription refill requests, have your pharmacy contact our office and allow 72 hours for refills to be completed.    Today you received the following:  Retacrit/Vitamin B12.  Epoetin Alfa Injection What is this medication? EPOETIN ALFA (e POE e tin AL fa) treats low levels of red blood cells (anemia) caused by kidney disease, chemotherapy, or HIV medications. It can also be used in people who are at risk for blood loss during surgery. It works by Systems analyst make more red blood cells, which reduces the need for blood transfusions. This medicine may be used for other purposes; ask your health care provider or pharmacist if you have questions. COMMON BRAND NAME(S): Epogen, Procrit, Retacrit What should I tell my care team before I take this medication? They need to know if you have any of these conditions: Blood clots Cancer Heart disease High blood pressure On dialysis Seizures Stroke An unusual or allergic reaction to epoetin alfa, albumin, benzyl  alcohol, other medications, foods, dyes, or preservatives Pregnant or trying to get pregnant Breast-feeding How should I use this medication? This medication is injected into a vein or under the skin. It is usually given by your care team in a hospital or clinic setting. It may also be given at home. If you get this medication at home, you will be taught how to prepare and give it. Use exactly as directed. Take it as directed on the prescription label at the same time every day. Keep taking it unless your care team tells you to stop. It is important that you put your used needles and syringes in a special sharps container. Do not put them in a trash can. If you do not have a sharps container, call your pharmacist or care team to get one. A special MedGuide will be given to you by the pharmacist with each prescription and refill. Be sure to read this information carefully each time. Talk to your care team about the use of this medication in children. While this medication may be used in children as young as 1 month of age for selected conditions, precautions do apply. Overdosage: If you think you have taken too much of this medicine contact a poison control center or emergency room at once. NOTE: This medicine is only for you. Do not share this medicine with others. What if I miss a dose? If you miss a dose, take it as soon as you can. If it is almost time for your  next dose, take only that dose. Do not take double or extra doses. What may interact with this medication? Darbepoetin alfa Methoxy polyethylene glycol-epoetin beta This list may not describe all possible interactions. Give your health care provider a list of all the medicines, herbs, non-prescription drugs, or dietary supplements you use. Also tell them if you smoke, drink alcohol, or use illegal drugs. Some items may interact with your medicine. What should I watch for while using this medication? Visit your care team for regular checks  on your progress. Check your blood pressure as directed. Know what your blood pressure should be and when to contact your care team. Your condition will be monitored carefully while you are receiving this medication. You may need blood work while taking this medication. What side effects may I notice from receiving this medication? Side effects that you should report to your care team as soon as possible: Allergic reactions--skin rash, itching, hives, swelling of the face, lips, tongue, or throat Blood clot--pain, swelling, or warmth in the leg, shortness of breath, chest pain Heart attack--pain or tightness in the chest, shoulders, arms, or jaw, nausea, shortness of breath, cold or clammy skin, feeling faint or lightheaded Increase in blood pressure Rash, fever, and swollen lymph nodes Redness, blistering, peeling, or loosening of the skin, including inside the mouth Seizures Stroke--sudden numbness or weakness of the face, arm, or leg, trouble speaking, confusion, trouble walking, loss of balance or coordination, dizziness, severe headache, change in vision Side effects that usually do not require medical attention (report to your care team if they continue or are bothersome): Bone, joint, or muscle pain Cough Headache Nausea Pain, redness, or irritation at injection site This list may not describe all possible side effects. Call your doctor for medical advice about side effects. You may report side effects to FDA at 1-800-FDA-1088. Where should I keep my medication? Keep out of the reach of children and pets. Store in a refrigerator. Do not freeze. Do not shake. Protect from light. Keep this medication in the original container until you are ready to take it. See product for storage information. Get rid of any unused medication after the expiration date. To get rid of medications that are no longer needed or have expired: Take the medication to a medication take-back program. Check with  your pharmacy or law enforcement to find a location. If you cannot return the medication, ask your pharmacist or care team how to get rid of the medication safely. NOTE: This sheet is a summary. It may not cover all possible information. If you have questions about this medicine, talk to your doctor, pharmacist, or health care provider.  2024 Elsevier/Gold Standard (2021-10-20 00:00:00)    Vitamin B12 Injection What is this medication? Vitamin B12 (VAHY tuh min B12) prevents and treats low vitamin B12 levels in your body. It is used in people who do not get enough vitamin B12 from their diet or when their digestive tract does not absorb enough. Vitamin B12 plays an important role in maintaining the health of your nervous system and red blood cells. This medicine may be used for other purposes; ask your health care provider or pharmacist if you have questions. COMMON BRAND NAME(S): B-12 Compliance Kit, B-12 Injection Kit, Cyomin, Dodex, LA-12, Nutri-Twelve, Physicians EZ Use B-12, Primabalt, Vitamin Deficiency Injectable System - B12 What should I tell my care team before I take this medication? They need to know if you have any of these conditions: Kidney disease Leber's disease Megaloblastic  anemia An unusual or allergic reaction to cyanocobalamin, cobalt, other medications, foods, dyes, or preservatives Pregnant or trying to get pregnant Breast-feeding How should I use this medication? This medication is injected into a muscle or deeply under the skin. It is usually given in a clinic or care team's office. However, your care team may teach you how to inject yourself. Follow all instructions. Talk to your care team about the use of this medication in children. Special care may be needed. Overdosage: If you think you have taken too much of this medicine contact a poison control center or emergency room at once. NOTE: This medicine is only for you. Do not share this medicine with  others. What if I miss a dose? If you are given your dose at a clinic or care team's office, call to reschedule your appointment. If you give your own injections, and you miss a dose, take it as soon as you can. If it is almost time for your next dose, take only that dose. Do not take double or extra doses. What may interact with this medication? Alcohol Colchicine This list may not describe all possible interactions. Give your health care provider a list of all the medicines, herbs, non-prescription drugs, or dietary supplements you use. Also tell them if you smoke, drink alcohol, or use illegal drugs. Some items may interact with your medicine. What should I watch for while using this medication? Visit your care team regularly. You may need blood work done while you are taking this medication. You may need to follow a special diet. Talk to your care team. Limit your alcohol intake and avoid smoking to get the best benefit. What side effects may I notice from receiving this medication? Side effects that you should report to your care team as soon as possible: Allergic reactions--skin rash, itching, hives, swelling of the face, lips, tongue, or throat Swelling of the ankles, hands, or feet Trouble breathing Side effects that usually do not require medical attention (report to your care team if they continue or are bothersome): Diarrhea This list may not describe all possible side effects. Call your doctor for medical advice about side effects. You may report side effects to FDA at 1-800-FDA-1088. Where should I keep my medication? Keep out of the reach of children. Store at room temperature between 15 and 30 degrees C (59 and 85 degrees F). Protect from light. Throw away any unused medication after the expiration date. NOTE: This sheet is a summary. It may not cover all possible information. If you have questions about this medicine, talk to your doctor, pharmacist, or health care provider.   2024 Elsevier/Gold Standard (2021-02-28 00:00:00)     To help prevent nausea and vomiting after your treatment, we encourage you to take your nausea medication as directed.  BELOW ARE SYMPTOMS THAT SHOULD BE REPORTED IMMEDIATELY: *FEVER GREATER THAN 100.4 F (38 C) OR HIGHER *CHILLS OR SWEATING *NAUSEA AND VOMITING THAT IS NOT CONTROLLED WITH YOUR NAUSEA MEDICATION *UNUSUAL SHORTNESS OF BREATH *UNUSUAL BRUISING OR BLEEDING *URINARY PROBLEMS (pain or burning when urinating, or frequent urination) *BOWEL PROBLEMS (unusual diarrhea, constipation, pain near the anus) TENDERNESS IN MOUTH AND THROAT WITH OR WITHOUT PRESENCE OF ULCERS (sore throat, sores in mouth, or a toothache) UNUSUAL RASH, SWELLING OR PAIN  UNUSUAL VAGINAL DISCHARGE OR ITCHING   Items with * indicate a potential emergency and should be followed up as soon as possible or go to the Emergency Department if any problems should occur.  Please  show the CHEMOTHERAPY ALERT CARD or IMMUNOTHERAPY ALERT CARD at check-in to the Emergency Department and triage nurse.  Should you have questions after your visit or need to cancel or reschedule your appointment, please contact [facility]  - A DEPT OF [facility] [phone]  and follow the prompts.  Office hours are 8:00 a.m. to 4:30 p.m. Monday - Friday. Please note that voicemails left after 4:00 p.m. may not be returned until the following business day.  We are closed weekends and major holidays. You have access to a nurse at all times for urgent questions. Please call the main number to the clinic [phone] and follow the prompts.  For any non-urgent questions, you may also contact your provider using MyChart. We now offer e-Visits for anyone 31 and older to request care online for non-urgent symptoms. For details visit [email or web address].   Also download the MyChart app! Go to the app store, search "MyChart", open the app, select [place], and  log in with your MyChart username and password.

## 2023-08-01 NOTE — Progress Notes (Signed)
Hemoglobin today is 9.6.  We will proceed with Retacrit per provider orders.   Patient tolerated Retacrit and Vitamin B12 injections with no complaints voiced.  Site clean and dry with no bruising or swelling noted.  No complaints of pain.  Discharged with vital signs stable and no signs or symptoms of distress noted.

## 2023-08-05 ENCOUNTER — Ambulatory Visit: Payer: Medicare Other | Attending: Cardiology

## 2023-08-05 DIAGNOSIS — I6523 Occlusion and stenosis of bilateral carotid arteries: Secondary | ICD-10-CM

## 2023-08-06 ENCOUNTER — Inpatient Hospital Stay: Payer: Medicare Other | Attending: Hematology

## 2023-08-06 VITALS — BP 121/63 | HR 59 | Temp 97.5°F | Resp 16

## 2023-08-06 DIAGNOSIS — Z79899 Other long term (current) drug therapy: Secondary | ICD-10-CM | POA: Diagnosis not present

## 2023-08-06 DIAGNOSIS — R5383 Other fatigue: Secondary | ICD-10-CM | POA: Insufficient documentation

## 2023-08-06 DIAGNOSIS — C7A010 Malignant carcinoid tumor of the duodenum: Secondary | ICD-10-CM | POA: Insufficient documentation

## 2023-08-06 DIAGNOSIS — I129 Hypertensive chronic kidney disease with stage 1 through stage 4 chronic kidney disease, or unspecified chronic kidney disease: Secondary | ICD-10-CM | POA: Insufficient documentation

## 2023-08-06 DIAGNOSIS — Z822 Family history of deafness and hearing loss: Secondary | ICD-10-CM | POA: Insufficient documentation

## 2023-08-06 DIAGNOSIS — E785 Hyperlipidemia, unspecified: Secondary | ICD-10-CM | POA: Diagnosis not present

## 2023-08-06 DIAGNOSIS — R079 Chest pain, unspecified: Secondary | ICD-10-CM | POA: Diagnosis not present

## 2023-08-06 DIAGNOSIS — Z800 Family history of malignant neoplasm of digestive organs: Secondary | ICD-10-CM | POA: Insufficient documentation

## 2023-08-06 DIAGNOSIS — Z8249 Family history of ischemic heart disease and other diseases of the circulatory system: Secondary | ICD-10-CM | POA: Insufficient documentation

## 2023-08-06 DIAGNOSIS — D472 Monoclonal gammopathy: Secondary | ICD-10-CM | POA: Insufficient documentation

## 2023-08-06 DIAGNOSIS — I2510 Atherosclerotic heart disease of native coronary artery without angina pectoris: Secondary | ICD-10-CM | POA: Insufficient documentation

## 2023-08-06 DIAGNOSIS — Z8379 Family history of other diseases of the digestive system: Secondary | ICD-10-CM | POA: Insufficient documentation

## 2023-08-06 DIAGNOSIS — E538 Deficiency of other specified B group vitamins: Secondary | ICD-10-CM | POA: Diagnosis not present

## 2023-08-06 DIAGNOSIS — Z825 Family history of asthma and other chronic lower respiratory diseases: Secondary | ICD-10-CM | POA: Insufficient documentation

## 2023-08-06 DIAGNOSIS — E669 Obesity, unspecified: Secondary | ICD-10-CM | POA: Diagnosis not present

## 2023-08-06 DIAGNOSIS — Z803 Family history of malignant neoplasm of breast: Secondary | ICD-10-CM | POA: Insufficient documentation

## 2023-08-06 DIAGNOSIS — E039 Hypothyroidism, unspecified: Secondary | ICD-10-CM | POA: Insufficient documentation

## 2023-08-06 DIAGNOSIS — Z87891 Personal history of nicotine dependence: Secondary | ICD-10-CM | POA: Diagnosis not present

## 2023-08-06 DIAGNOSIS — D500 Iron deficiency anemia secondary to blood loss (chronic): Secondary | ICD-10-CM | POA: Diagnosis not present

## 2023-08-06 DIAGNOSIS — E1122 Type 2 diabetes mellitus with diabetic chronic kidney disease: Secondary | ICD-10-CM | POA: Diagnosis not present

## 2023-08-06 DIAGNOSIS — K922 Gastrointestinal hemorrhage, unspecified: Secondary | ICD-10-CM | POA: Insufficient documentation

## 2023-08-06 DIAGNOSIS — K5900 Constipation, unspecified: Secondary | ICD-10-CM | POA: Insufficient documentation

## 2023-08-06 DIAGNOSIS — I4891 Unspecified atrial fibrillation: Secondary | ICD-10-CM | POA: Insufficient documentation

## 2023-08-06 DIAGNOSIS — M25561 Pain in right knee: Secondary | ICD-10-CM | POA: Insufficient documentation

## 2023-08-06 DIAGNOSIS — G8929 Other chronic pain: Secondary | ICD-10-CM | POA: Insufficient documentation

## 2023-08-06 DIAGNOSIS — D631 Anemia in chronic kidney disease: Secondary | ICD-10-CM | POA: Diagnosis not present

## 2023-08-06 DIAGNOSIS — M2550 Pain in unspecified joint: Secondary | ICD-10-CM | POA: Insufficient documentation

## 2023-08-06 DIAGNOSIS — Z7901 Long term (current) use of anticoagulants: Secondary | ICD-10-CM | POA: Insufficient documentation

## 2023-08-06 DIAGNOSIS — N1832 Chronic kidney disease, stage 3b: Secondary | ICD-10-CM | POA: Insufficient documentation

## 2023-08-06 DIAGNOSIS — Z888 Allergy status to other drugs, medicaments and biological substances status: Secondary | ICD-10-CM | POA: Insufficient documentation

## 2023-08-06 DIAGNOSIS — Z885 Allergy status to narcotic agent status: Secondary | ICD-10-CM | POA: Insufficient documentation

## 2023-08-06 DIAGNOSIS — Z7982 Long term (current) use of aspirin: Secondary | ICD-10-CM | POA: Diagnosis not present

## 2023-08-06 DIAGNOSIS — Z833 Family history of diabetes mellitus: Secondary | ICD-10-CM | POA: Insufficient documentation

## 2023-08-06 MED ORDER — SODIUM CHLORIDE 0.9 % IV SOLN
Freq: Once | INTRAVENOUS | Status: AC
Start: 2023-08-06 — End: 2023-08-06

## 2023-08-06 MED ORDER — SODIUM CHLORIDE 0.9 % IV SOLN
510.0000 mg | Freq: Once | INTRAVENOUS | Status: AC
  Administered 2023-08-06: 510 mg via INTRAVENOUS
  Filled 2023-08-06: qty 510

## 2023-08-06 MED ORDER — CETIRIZINE HCL 10 MG PO TABS: 10.0000 mg | ORAL_TABLET | Freq: Once | ORAL | Status: DC

## 2023-08-06 MED ORDER — ACETAMINOPHEN 325 MG PO TABS
650.0000 mg | ORAL_TABLET | Freq: Once | ORAL | Status: DC
Start: 2023-08-06 — End: 2023-08-06

## 2023-08-06 NOTE — Progress Notes (Signed)
 Patient presents today for iron infusion.  Patient is in satisfactory condition with no new complaints voiced.  Vital signs are stable.  We will proceed with infusion per provider orders.    Peripheral IV started with good blood return pre and post infusion.  Feraheme 510 mg given today per MD orders. Tolerated infusion without adverse affects. Vital signs stable. No complaints at this time. Discharged from clinic via wheelchair in stable condition. Alert and oriented x 3. F/U with [facility] as scheduled.

## 2023-08-15 ENCOUNTER — Inpatient Hospital Stay: Payer: Medicare Other

## 2023-08-15 VITALS — BP 126/60 | HR 60 | Temp 97.5°F | Resp 17

## 2023-08-15 DIAGNOSIS — G8929 Other chronic pain: Secondary | ICD-10-CM | POA: Diagnosis not present

## 2023-08-15 DIAGNOSIS — E1122 Type 2 diabetes mellitus with diabetic chronic kidney disease: Secondary | ICD-10-CM | POA: Diagnosis not present

## 2023-08-15 DIAGNOSIS — D631 Anemia in chronic kidney disease: Secondary | ICD-10-CM | POA: Diagnosis not present

## 2023-08-15 DIAGNOSIS — D472 Monoclonal gammopathy: Secondary | ICD-10-CM | POA: Diagnosis not present

## 2023-08-15 DIAGNOSIS — D500 Iron deficiency anemia secondary to blood loss (chronic): Secondary | ICD-10-CM

## 2023-08-15 DIAGNOSIS — M25561 Pain in right knee: Secondary | ICD-10-CM | POA: Diagnosis not present

## 2023-08-15 DIAGNOSIS — E785 Hyperlipidemia, unspecified: Secondary | ICD-10-CM | POA: Diagnosis not present

## 2023-08-15 DIAGNOSIS — R5383 Other fatigue: Secondary | ICD-10-CM | POA: Diagnosis not present

## 2023-08-15 DIAGNOSIS — I2510 Atherosclerotic heart disease of native coronary artery without angina pectoris: Secondary | ICD-10-CM | POA: Diagnosis not present

## 2023-08-15 DIAGNOSIS — Z7982 Long term (current) use of aspirin: Secondary | ICD-10-CM | POA: Diagnosis not present

## 2023-08-15 DIAGNOSIS — Z7901 Long term (current) use of anticoagulants: Secondary | ICD-10-CM | POA: Diagnosis not present

## 2023-08-15 DIAGNOSIS — E039 Hypothyroidism, unspecified: Secondary | ICD-10-CM | POA: Diagnosis not present

## 2023-08-15 DIAGNOSIS — D649 Anemia, unspecified: Secondary | ICD-10-CM

## 2023-08-15 DIAGNOSIS — R079 Chest pain, unspecified: Secondary | ICD-10-CM | POA: Diagnosis not present

## 2023-08-15 DIAGNOSIS — Z79899 Other long term (current) drug therapy: Secondary | ICD-10-CM | POA: Diagnosis not present

## 2023-08-15 DIAGNOSIS — M2550 Pain in unspecified joint: Secondary | ICD-10-CM | POA: Diagnosis not present

## 2023-08-15 DIAGNOSIS — N1832 Chronic kidney disease, stage 3b: Secondary | ICD-10-CM | POA: Diagnosis not present

## 2023-08-15 DIAGNOSIS — K5900 Constipation, unspecified: Secondary | ICD-10-CM | POA: Diagnosis not present

## 2023-08-15 DIAGNOSIS — K922 Gastrointestinal hemorrhage, unspecified: Secondary | ICD-10-CM | POA: Diagnosis not present

## 2023-08-15 DIAGNOSIS — I4891 Unspecified atrial fibrillation: Secondary | ICD-10-CM | POA: Diagnosis not present

## 2023-08-15 DIAGNOSIS — I129 Hypertensive chronic kidney disease with stage 1 through stage 4 chronic kidney disease, or unspecified chronic kidney disease: Secondary | ICD-10-CM | POA: Diagnosis not present

## 2023-08-15 DIAGNOSIS — Z87891 Personal history of nicotine dependence: Secondary | ICD-10-CM | POA: Diagnosis not present

## 2023-08-15 DIAGNOSIS — E538 Deficiency of other specified B group vitamins: Secondary | ICD-10-CM | POA: Diagnosis not present

## 2023-08-15 DIAGNOSIS — C7A010 Malignant carcinoid tumor of the duodenum: Secondary | ICD-10-CM | POA: Diagnosis not present

## 2023-08-15 LAB — CBC WITH DIFFERENTIAL/PLATELET
Abs Immature Granulocytes: 0.01 10*3/uL (ref 0.00–0.07)
Basophils Absolute: 0 10*3/uL (ref 0.0–0.1)
Basophils Relative: 1 %
Eosinophils Absolute: 0.4 10*3/uL (ref 0.0–0.5)
Eosinophils Relative: 7 %
HCT: 35.3 % — ABNORMAL LOW (ref 36.0–46.0)
Hemoglobin: 10.9 g/dL — ABNORMAL LOW (ref 12.0–15.0)
Immature Granulocytes: 0 %
Lymphocytes Relative: 18 %
Lymphs Abs: 0.9 10*3/uL (ref 0.7–4.0)
MCH: 30 pg (ref 26.0–34.0)
MCHC: 30.9 g/dL (ref 30.0–36.0)
MCV: 97.2 fL (ref 80.0–100.0)
Monocytes Absolute: 0.4 10*3/uL (ref 0.1–1.0)
Monocytes Relative: 9 %
Neutro Abs: 3.1 10*3/uL (ref 1.7–7.7)
Neutrophils Relative %: 65 %
Platelets: 156 10*3/uL (ref 150–400)
RBC: 3.63 MIL/uL — ABNORMAL LOW (ref 3.87–5.11)
RDW: 15.4 % (ref 11.5–15.5)
WBC: 4.8 10*3/uL (ref 4.0–10.5)
nRBC: 0 % (ref 0.0–0.2)

## 2023-08-15 LAB — SAMPLE TO BLOOD BANK

## 2023-08-15 MED ORDER — EPOETIN ALFA-EPBX 20000 UNIT/ML IJ SOLN
20000.0000 [IU] | Freq: Once | INTRAMUSCULAR | Status: AC
  Administered 2023-08-15: 20000 [IU] via SUBCUTANEOUS
  Filled 2023-08-15: qty 1

## 2023-08-15 NOTE — Patient Instructions (Signed)
[facility] [place] - A DEPT OF [facility]  Discharge Instructions: Thank you for choosing [facility] to provide your oncology and hematology care.  If you have a lab appointment with the Cancer Center - please note that after April 8th, 2024, all labs will be drawn in the cancer center.  You do not have to check in or register with the main entrance as you have in the past but will complete your check-in in the cancer center.  Wear comfortable clothing and clothing appropriate for easy access to any Portacath or PICC line.   We strive to give you quality time with your provider. You may need to reschedule your appointment if you arrive late (15 or more minutes).  Arriving late affects you and other patients whose appointments are after yours.  Also, if you miss three or more appointments without notifying the office, you may be dismissed from the clinic at the provider's discretion.      For prescription refill requests, have your pharmacy contact our office and allow 72 hours for refills to be completed.    Today you received the following :  Retacrit.  Epoetin Alfa Injection What is this medication? EPOETIN ALFA (e POE e tin AL fa) treats low levels of red blood cells (anemia) caused by kidney disease, chemotherapy, or HIV medications. It can also be used in people who are at risk for blood loss during surgery. It works by Systems analyst make more red blood cells, which reduces the need for blood transfusions. This medicine may be used for other purposes; ask your health care provider or pharmacist if you have questions. COMMON BRAND NAME(S): Epogen, Procrit, Retacrit What should I tell my care team before I take this medication? They need to know if you have any of these conditions: Blood clots Cancer Heart disease High blood pressure On dialysis Seizures Stroke An unusual or allergic reaction to epoetin alfa, albumin, benzyl alcohol, other  medications, foods, dyes, or preservatives Pregnant or trying to get pregnant Breast-feeding How should I use this medication? This medication is injected into a vein or under the skin. It is usually given by your care team in a hospital or clinic setting. It may also be given at home. If you get this medication at home, you will be taught how to prepare and give it. Use exactly as directed. Take it as directed on the prescription label at the same time every day. Keep taking it unless your care team tells you to stop. It is important that you put your used needles and syringes in a special sharps container. Do not put them in a trash can. If you do not have a sharps container, call your pharmacist or care team to get one. A special MedGuide will be given to you by the pharmacist with each prescription and refill. Be sure to read this information carefully each time. Talk to your care team about the use of this medication in children. While this medication may be used in children as young as 1 month of age for selected conditions, precautions do apply. Overdosage: If you think you have taken too much of this medicine contact a poison control center or emergency room at once. NOTE: This medicine is only for you. Do not share this medicine with others. What if I miss a dose? If you miss a dose, take it as soon as you can. If it is almost time for your  next dose, take only that dose. Do not take double or extra doses. What may interact with this medication? Darbepoetin alfa Methoxy polyethylene glycol-epoetin beta This list may not describe all possible interactions. Give your health care provider a list of all the medicines, herbs, non-prescription drugs, or dietary supplements you use. Also tell them if you smoke, drink alcohol, or use illegal drugs. Some items may interact with your medicine. What should I watch for while using this medication? Visit your care team for regular checks on your  progress. Check your blood pressure as directed. Know what your blood pressure should be and when to contact your care team. Your condition will be monitored carefully while you are receiving this medication. You may need blood work while taking this medication. What side effects may I notice from receiving this medication? Side effects that you should report to your care team as soon as possible: Allergic reactions--skin rash, itching, hives, swelling of the face, lips, tongue, or throat Blood clot--pain, swelling, or warmth in the leg, shortness of breath, chest pain Heart attack--pain or tightness in the chest, shoulders, arms, or jaw, nausea, shortness of breath, cold or clammy skin, feeling faint or lightheaded Increase in blood pressure Rash, fever, and swollen lymph nodes Redness, blistering, peeling, or loosening of the skin, including inside the mouth Seizures Stroke--sudden numbness or weakness of the face, arm, or leg, trouble speaking, confusion, trouble walking, loss of balance or coordination, dizziness, severe headache, change in vision Side effects that usually do not require medical attention (report to your care team if they continue or are bothersome): Bone, joint, or muscle pain Cough Headache Nausea Pain, redness, or irritation at injection site This list may not describe all possible side effects. Call your doctor for medical advice about side effects. You may report side effects to FDA at 1-800-FDA-1088. Where should I keep my medication? Keep out of the reach of children and pets. Store in a refrigerator. Do not freeze. Do not shake. Protect from light. Keep this medication in the original container until you are ready to take it. See product for storage information. Get rid of any unused medication after the expiration date. To get rid of medications that are no longer needed or have expired: Take the medication to a medication take-back program. Check with your  pharmacy or law enforcement to find a location. If you cannot return the medication, ask your pharmacist or care team how to get rid of the medication safely. NOTE: This sheet is a summary. It may not cover all possible information. If you have questions about this medicine, talk to your doctor, pharmacist, or health care provider.  2024 Elsevier/Gold Standard (2021-10-20 00:00:00)    To help prevent nausea and vomiting after your treatment, we encourage you to take your nausea medication as directed.  BELOW ARE SYMPTOMS THAT SHOULD BE REPORTED IMMEDIATELY: *FEVER GREATER THAN 100.4 F (38 C) OR HIGHER *CHILLS OR SWEATING *NAUSEA AND VOMITING THAT IS NOT CONTROLLED WITH YOUR NAUSEA MEDICATION *UNUSUAL SHORTNESS OF BREATH *UNUSUAL BRUISING OR BLEEDING *URINARY PROBLEMS (pain or burning when urinating, or frequent urination) *BOWEL PROBLEMS (unusual diarrhea, constipation, pain near the anus) TENDERNESS IN MOUTH AND THROAT WITH OR WITHOUT PRESENCE OF ULCERS (sore throat, sores in mouth, or a toothache) UNUSUAL RASH, SWELLING OR PAIN  UNUSUAL VAGINAL DISCHARGE OR ITCHING   Items with * indicate a potential emergency and should be followed up as soon as possible or go to the Emergency Department if any problems should  occur.  Please show the CHEMOTHERAPY ALERT CARD or IMMUNOTHERAPY ALERT CARD at check-in to the Emergency Department and triage nurse.  Should you have questions after your visit or need to cancel or reschedule your appointment, please contact [facility] [place] - A DEPT OF [facility] [phone]  and follow the prompts.  Office hours are 8:00 a.m. to 4:30 p.m. Monday - Friday. Please note that voicemails left after 4:00 p.m. may not be returned until the following business day.  We are closed weekends and major holidays. You have access to a nurse at all times for urgent questions. Please call the main number to the clinic [phone] and follow the  prompts.  For any non-urgent questions, you may also contact your provider using MyChart. We now offer e-Visits for anyone 73 and older to request care online for non-urgent symptoms. For details visit [email or web address].   Also download the MyChart app! Go to the app store, search "MyChart", open the app, select [place], and log in with your MyChart username and password.

## 2023-08-15 NOTE — Progress Notes (Signed)
Hemoglobin today is 10.9.  We will proceed with Retacrit injection per provider orders.   Patient tolerated injection with no complaints voiced.  Site clean and dry with no bruising or swelling noted.  No complaints of pain.  Discharged with vital signs stable and no signs or symptoms of distress noted.

## 2023-08-28 ENCOUNTER — Other Ambulatory Visit: Payer: Self-pay

## 2023-08-28 DIAGNOSIS — N1832 Chronic kidney disease, stage 3b: Secondary | ICD-10-CM

## 2023-08-28 DIAGNOSIS — D500 Iron deficiency anemia secondary to blood loss (chronic): Secondary | ICD-10-CM

## 2023-08-28 DIAGNOSIS — D649 Anemia, unspecified: Secondary | ICD-10-CM

## 2023-08-28 NOTE — Progress Notes (Unsigned)
 [facility] [address] [place] [address]    Clinic Day:  08/28/2023  Referring physician: Kerri Perches, MD  Patient Care Team: Kerri Perches, MD as PCP - General (Family Medicine) Jonelle Sidle, MD as PCP - Cardiology (Cardiology) Luis Abed, MD as Consulting Physician (Cardiology) Sallye Lat, MD as Consulting Physician (Optometry) Adam Phenix, DPM as Consulting Physician (Podiatry) Sharlene Dory, NP as Nurse Practitioner (Cardiology) Doreatha Massed, MD as Medical Oncologist (Hematology and Oncology)   ASSESSMENT & PLAN:   Assessment: 1.  Duodenal neuroendocrine tumor: - 04/27/2021 (duodenal mass biopsy): Well-differentiated carcinoid tumor, grade 1, Ki-67 1%. - Dotatate PET (05/2021): No focal activity within the duodenum or evidence of metastatic disease. - EGD/EUS (08/2021): Submucosal nodule found in the duodenum-consistent with previously biopsied duodenal NET.  An intramural subepithelial lesion found in the second part of the duodenum.  Lesion appears to originate from within the deep mucosa and submucosa.  She was not felt to be candidate for EMR. - Patient had traumatic experience after the EUS and did not want to pursue further options and declined a referral to [facility]. - Denies any diarrhea/flushing/wheezing/abdominal pain.  No obstructive symptoms.   2.  Normocytic anemia: - More than 5-year history of iron deficiency anemia secondary to GI bleed.  She underwent multiple EGDs and colonoscopies and 2 small bowel capsule endoscopies. - She reports taking iron tablet daily for the past 3 years.  Denies any ice pica. - Denies any BRBPR/melena.  She had blood transfusion 01/09/2023. -She also receives intermittent IV iron.  Last IV Feraheme was given on 03/01/2023.   3.  Social/family history: - Lives at home with her husband.  Independent of ADLs and IADLs.  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  No chemical exposure.  Smoked half pack per day starting at age 61 and quit in 2005. - Brother had colon cancer.  Older brother had liver cancer.    Plan: 1.  Normocytic anemia: - Multifactorial anemia from CKD, iron deficiency and chronic blood loss.  -No evidence of bleeding per patient. -She is status post several blood transfusions last given on 01/30/2023 for hemoglobin of 7.2. - Labs from 08/29/2023 show hemoglobin of 10.9 (9.6), MCV 95.6.  WBC 3.9 with normal differential. -She received IV Feraheme last on 07/16/2023 and 08/06/2023.  -Iron labs from 08/29/2023 show saturation of 25%, TIBC 315 with a ferritin of 141 (41).  No additional IV iron needed. -She continues oral iron 325 mg daily with mild constipation. - She is receiving 20,000 units Retacrit every 2 weeks beginning on 06/06/2023 when it was increased from 10,000 units to maintain a hemoglobin between 10 and 11.  Baseline creatinine is between 1.4 and 1.76.  Today's creatinine is 1.68. -Proceed with 20,000 units Retacrit today.   -We discussed decreasing frequency of Retacrit given hemoglobin is 10.9 today.  Recommend monthly Retacrit with labs along with B12 injection to maintain a hemoglobin between 10 and 11.  Hold for hemoglobin greater than 11.    2.  Duodenal carcinoid tumor: - Chromogranin (06/06/23): 592.2 (847).  - 24-hour urine 5-HIAA (11/21/2022): Normal - Reviewed dotatate PET scan from 12/06/2022: No evidence of neuroendocrine tumor or metastasis.  3.  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 -Previous workup showed elevated kappa lambda light chain with a normal ratio.  Immunofixation shows IgG kappa monoclonal protein. Calcium is  normal and her creatinine is at baseline.  We will closely monitor at this time.  4.  B12 deficiency: -B12 level has improved to 363 since starting B12 injections. -Continue B12 injections monthly.  Proceed with B12 today.   PLAN  SUMMARY: >> Recommend every 4-week Retacrit injections given hemoglobin has improved to 10.9. Recommend 20,000 units Retacrit every 4 weeks.  Hold Retacrit for hemoglobin 11 or higher. >> B12 injections monthly. >> Continue oral iron.   >> Recommend follow-up with lab work in 12 weeks, Retacrit/B12 and see a provider.     No orders of the defined types were placed in this encounter.  I spent 25 minutes dedicated to the care of this patient (face-to-face and non-face-to-face) on the date of the encounter to include what is described in the assessment and plan.  Mauro Kaufmann, NP   2/26/202511:59 AM  CHIEF COMPLAINT:   Diagnosis: history of NET and anemia    Cancer Staging  No matching staging information was found for the patient.    Prior Therapy: none  Current Therapy:   retacrit injections, monthly   HISTORY OF PRESENT ILLNESS:   Oncology History   No history exists.     INTERVAL HISTORY:   [name] [name] is a 83 y.o. female presenting to clinic today for follow up of history of NET and anemia. She was last seen in clinic on 06/06/2023.  She received 2 doses of Feraheme on 07/16/2023 and 08/06/2023.  She has been receiving Retacrit 20,000 units every 2 weeks since 06/06/2023 when Retacrit was increased from 10,000 units to 20,000 units due to declining hemoglobin.  She is also receiving B12 injections.  Last injection was given on 08/01/2023.  Today, she reports feeling well.  Has chronic stable fatigue.  Appetite is 100% energy levels are 75%.  Denies any bright red blood per rectum, hematochezia, melena, gingival bleeding or epistaxis.  She continues to have chronic right knee pain and receives steroid injections which have helped.  She uses a cane for ambulation.  She continues to tolerate iron supplements.  Has occasional constipation and will intermittently take a stool softener.  Has occasional chest pain secondary to atrial fibrillation.  She was recently taken off Xarelto  and placed on aspirin 81 mg due to worsening anemia.   She denies any hospitalizations, surgeries or changes to her baseline health.  PAST MEDICAL HISTORY:   Past Medical History: Past Medical History:  Diagnosis Date   A-fib (HCC)    Blood transfusion without reported diagnosis    CAD (coronary artery disease)    Multivessel status post CABG October 2005   Carotid artery disease (HCC)    CKD (chronic kidney disease), stage III (HCC)    Essential hypertension    Glaucoma    Laser surgery 2010   Hyperlipidemia    Hypothyroidism    Iron deficiency anemia    Obesity     Surgical History: Past Surgical History:  Procedure Laterality Date   BIOPSY  04/27/2021   Procedure: BIOPSY;  Surgeon: Rachael Fee, MD;  Location: [facility];  Service: Endoscopy;;   COLONOSCOPY N/A 02/04/2013   Procedure: COLONOSCOPY;  Surgeon: Malissa Hippo, MD;  Location: [facility];  Service: Endoscopy;  Laterality: N/A;  1015   COLONOSCOPY N/A 12/21/2016   Procedure: COLONOSCOPY;  Surgeon: Malissa Hippo, MD;  [facility]: [facility];  Service: Endoscopy;  Laterality: N/A;   COLONOSCOPY WITH PROPOFOL N/A 03/22/2021   Procedure: COLONOSCOPY WITH PROPOFOL;  Surgeon: Malissa Hippo, MD;  [facility]: [facility];  Service: Endoscopy;  Laterality: N/A;  8:15   CORONARY ARTERY BYPASS GRAFT  October 2005   Dr. Cornelius Moras - LIMA to LAD, SVG to OM 2 and OM 3, SVG to PDA   ESOPHAGOGASTRODUODENOSCOPY N/A 12/21/2016   Procedure: ESOPHAGOGASTRODUODENOSCOPY (EGD);  Surgeon: Malissa Hippo, MD;  [facility]: [facility];  Service: Endoscopy;  Laterality: N/A;  11:10   ESOPHAGOGASTRODUODENOSCOPY N/A 02/27/2018   Procedure: ESOPHAGOGASTRODUODENOSCOPY (EGD);  Surgeon: Malissa Hippo, MD;  [facility]: [facility];  Service: Endoscopy;  Laterality: N/A;   ESOPHAGOGASTRODUODENOSCOPY (EGD) WITH PROPOFOL N/A 04/27/2021   Procedure: ESOPHAGOGASTRODUODENOSCOPY (EGD) WITH PROPOFOL;  Surgeon: Rachael Fee, MD;  Location:  [facility];  Service: Endoscopy;  Laterality: N/A;   ESOPHAGOGASTRODUODENOSCOPY (EGD) WITH PROPOFOL N/A 08/17/2021   Procedure: ESOPHAGOGASTRODUODENOSCOPY (EGD) WITH PROPOFOL;  Surgeon: Meridee Score Netty Starring., MD;  Location: [facility];  Service: Gastroenterology;  Laterality: N/A;   EUS N/A 04/27/2021   Procedure: UPPER ENDOSCOPIC ULTRASOUND (EUS) RADIAL;  Surgeon: Rachael Fee, MD;  Location: [facility];  Service: Endoscopy;  Laterality: N/A;   EYE SURGERY  2010   laser treatment to both eyes for glaucoma   GIVENS CAPSULE STUDY N/A 03/07/2018   Procedure: GIVENS CAPSULE STUDY;  Surgeon: Malissa Hippo, MD;  [facility]: [facility];  Service: Endoscopy;  Laterality: N/A;   GIVENS CAPSULE STUDY N/A 02/05/2020   Procedure: GIVENS CAPSULE STUDY;  Surgeon: Malissa Hippo, MD;  [facility]: [facility];  Service: Endoscopy;  Laterality: N/A;  730   POLYPECTOMY  03/22/2021   Procedure: POLYPECTOMY;  Surgeon: Malissa Hippo, MD;  [facility]: [facility];  Service: Endoscopy;;   UPPER ESOPHAGEAL ENDOSCOPIC ULTRASOUND (EUS) N/A 08/17/2021   Procedure: UPPER ESOPHAGEAL ENDOSCOPIC ULTRASOUND (EUS);  Surgeon: Lemar Lofty., MD;  Location: [facility] END[facility];  Service: Gastroenterology;  Laterality: N/A;    Social History: Social History   Socioeconomic History   Marital status: Married    Spouse name: Not on file   Number of children: Not on file   Years of education: Not on file   Highest education level: Not on file  Occupational History   Occupation: retired   Tobacco Use   Smoking status: Former    Current packs/day: 0.00    Average packs/day: 0.3 packs/day for 40.0 years (10.0 ttl pk-yrs)    Types: Cigarettes    Start date: 07/03/1963    Quit date: 07/03/2003    Years since quitting: 20.1    Passive exposure: Never   Smokeless tobacco: Never  Vaping Use   Vaping status: Never Used  Substance and Sexual Activity   Alcohol use: No    Alcohol/week: 0.0 standard drinks of  alcohol   Drug use: No   Sexual activity: Never  Other Topics Concern   Not on file  Social History Narrative   Not on file   Social Drivers of Health   Financial Resource Strain: Low Risk  (12/19/2022)   Overall Financial Resource Strain (CARDIA)    Difficulty of Paying Living Expenses: Not hard at all  Food Insecurity: No Food Insecurity (12/19/2022)   Hunger Vital Sign    Worried About Running Out of Food in the Last Year: Never true    Ran Out of Food in the Last Year: Never true  Transportation Needs: No Transportation Needs (12/19/2022)   PRAPARE - Transportation    Lack of Transportation (Medical): No    Lack of  Transportation (Non-Medical): No  Physical Activity: Insufficiently Active (12/19/2022)   Exercise Vital Sign    Days of Exercise per Week: 5 days    Minutes of Exercise per Session: 10 min  Stress: No Stress Concern Present (12/19/2022)   [employer] of Occupational Health - Occupational Stress Questionnaire    Feeling of Stress : Not at all  Social Connections: Socially Integrated (12/19/2022)   Social Connection and Isolation Panel [NHANES]    Frequency of Communication with Friends and Family: More than three times a week    Frequency of Social Gatherings with Friends and Family: More than three times a week    Attends Religious Services: More than 4 times per year    Active Member of Clubs or Organizations: Yes    Attends Banker Meetings: More than 4 times per year    Marital Status: Married  Catering manager Violence: Not At Risk (12/19/2022)   Humiliation, Afraid, Rape, and Kick questionnaire    Fear of Current or Ex-Partner: No    Emotionally Abused: No    Physically Abused: No    Sexually Abused: No    Family History: Family History  Problem Relation Age of Onset   Aneurysm Mother    Hypertension Mother    Coronary artery disease Mother    Coronary artery disease Father    Diabetes Sister    Coronary artery disease Sister     Diverticulosis Sister    COPD Sister    Kidney disease Sister    Diabetes Sister    Hypertension Sister    Breast cancer Sister 66   Hearing loss Sister    Heart Problems Sister        pacemaker   Colon cancer Brother 64       lived 18 months afterwards   Liver cancer Brother 8   Diabetes Son    Pancreatic cancer Son    Esophageal cancer Neg Hx    Inflammatory bowel disease Neg Hx    Stomach cancer Neg Hx     Current Medications:  Current Outpatient Medications:    amLODipine (NORVASC) 5 MG tablet, Take one tablet by mouth two times daily, Disp: 180 tablet, Rfl: 3   aspirin EC 81 MG tablet, Take 1 tablet (81 mg total) by mouth daily. Swallow whole., Disp: 90 tablet, Rfl: 4   cetirizine (ALLERGY RELIEF CETIRIZINE) 10 MG tablet, TAKE 1 TABLET BY MOUTH DAILY, Disp: 100 tablet, Rfl: 0   chlorthalidone (HYGROTON) 25 MG tablet, Take 25 mg by mouth daily., Disp: , Rfl:    clonazePAM (KLONOPIN) 0.5 MG tablet, Take half to one tablet by mouth at bedtime  as needed for excess anxiety due to grief, Disp: 10 tablet, Rfl: 0   famotidine (PEPCID) 20 MG tablet, TAKE 1 TABLET BY MOUTH TWICE  DAILY, Disp: 120 tablet, Rfl: 5   ferrous sulfate 325 (65 FE) MG tablet, Take 325 mg by mouth daily with breakfast., Disp: , Rfl:    fish oil-omega-3 fatty acids 1000 MG capsule, Take 1 g by mouth daily., Disp: , Rfl:    fluticasone (FLONASE) 50 MCG/ACT nasal spray, USE 2 SPRAYS IN EACH NOSTRIL ONCE DAILY. SHAKE GENTLY BEFORE USING., Disp: 48 g, Rfl: 1   hydrALAZINE (APRESOLINE) 100 MG tablet, TAKE 1 TABLET BY MOUTH ONCE DAILY, Disp: 270 tablet, Rfl: 0   latanoprost (XALATAN) 0.005 % ophthalmic solution, Place 1 drop into both eyes at bedtime., Disp: , Rfl:    levothyroxine (SYNTHROID) 100 MCG  tablet, Take 1 tablet (100 mcg total) by mouth daily., Disp: 100 tablet, Rfl: 2   metoprolol succinate (TOPROL-XL) 50 MG 24 hr tablet, TAKE 1 TABLET BY MOUTH with OR immdiately following a meal, Disp: 30 tablet, Rfl: 0    nitroGLYCERIN (NITROSTAT) 0.4 MG SL tablet, DISSOLVE 1 TABLET UNDER THE  TONGUE EVERY 5 MINUTES AS NEEDED FOR CHEST PAIN. MAX OF 3 TABLETS IN 15 MINUTES. CALL 911 IF PAIN  PERSISTS., Disp: 100 tablet, Rfl: 2   Polyvinyl Alcohol-Povidone (REFRESH OP), Place 1 drop into both eyes at bedtime., Disp: , Rfl:    rosuvastatin (CRESTOR) 10 MG tablet, Take 1 tablet (10 mg total) by mouth daily., Disp: 90 tablet, Rfl: 3   SYSTANE ULTRA 0.4-0.3 % SOLN, Place 1 drop into both eyes daily as needed (dry eyes)., Disp: , Rfl:    telmisartan (MICARDIS) 20 MG tablet, Take 0.5 tablets (10 mg total) by mouth daily., Disp: 15 tablet, Rfl: 11   trolamine salicylate (ASPERCREME) 10 % cream, Apply 1 application topically as needed for muscle pain., Disp: , Rfl:    UNABLE TO FIND, Shower chair x 1  DX M54.36, Disp: 1 each, Rfl: 0   UNABLE TO FIND, Tdap x 1 fax vaccine record to [phone], Disp: 1 each, Rfl: 0   Allergies: Allergies  Allergen Reactions   Tramadol Itching   Lisinopril Cough   Shrimp (Diagnostic)     Gout flares   Clonidine Derivatives Other (See Comments)    Zombie BP dropped    REVIEW OF SYSTEMS:   Review of Systems  Constitutional:  Positive for fatigue.  Cardiovascular:  Positive for chest pain.  Gastrointestinal:  Positive for constipation. Negative for blood in stool.  Musculoskeletal:  Positive for arthralgias.     VITALS:   There were no vitals taken for this visit.  Wt Readings from Last 3 Encounters:  07/31/23 146 lb 3.2 oz (66.3 kg)  06/06/23 139 lb 12.4 oz (63.4 kg)  05/21/23 144 lb (65.3 kg)    There is no height or weight on file to calculate BMI.  Performance status (ECOG): 1 - Symptomatic but completely ambulatory  PHYSICAL EXAM:   Physical Exam Constitutional:      Appearance: Normal appearance.  Cardiovascular:     Rate and Rhythm: Normal rate. Rhythm irregular.  Pulmonary:     Effort: Pulmonary effort is normal.     Breath sounds: Normal breath sounds.   Abdominal:     General: Bowel sounds are normal.     Palpations: Abdomen is soft.  Musculoskeletal:        General: No swelling. Normal range of motion.  Neurological:     Mental Status: She is alert and oriented to person, place, and time. Mental status is at baseline.     LABS:      Latest Ref Rng & Units 08/15/2023    8:40 AM 08/01/2023    8:52 AM 07/18/2023    9:49 AM  CBC  WBC 4.0 - 10.5 K/uL 4.8  3.5  4.3   Hemoglobin 12.0 - 15.0 g/dL 09.8  9.6  9.2   Hematocrit 36.0 - 46.0 % 35.3  30.5  29.7   Platelets 150 - 400 K/uL 156  162  158       Latest Ref Rng & Units 06/06/2023    8:11 AM 06/06/2023    7:59 AM 04/04/2023    1:53 PM  CMP  Glucose 70 - 99 mg/dL  89  90  BUN 8 - 23 mg/dL  30  38   Creatinine 4.09 - 1.00 mg/dL  8.11  9.14   Sodium 782 - 145 mmol/L  137  140   Potassium 3.5 - 5.1 mmol/L  4.3  4.3   Chloride 98 - 111 mmol/L  109  108   CO2 22 - 32 mmol/L  21  26   Calcium 8.9 - 10.3 mg/dL  95.6  21.3    08.6   Total Protein 6.5 - 8.1 g/dL 7.7  7.7    Total Bilirubin <1.2 mg/dL 0.5  0.5    Alkaline Phos 38 - 126 U/L 86  84    AST 15 - 41 U/L 26  26    ALT 0 - 44 U/L 19  19       No results found for: "CEA1", "CEA" / No results found for: "CEA1", "CEA" No results found for: "PSA1" No results found for: "CAN199" No results found for: "CAN125"  Lab Results  Component Value Date   TOTALPROTELP 7.4 06/06/2023   TOTALPROTELP 7.0 06/06/2023   ALBUMINELP 3.9 06/06/2023   A1GS 0.3 06/06/2023   A2GS 0.7 06/06/2023   BETS 0.9 06/06/2023   GAMS 1.7 06/06/2023   MSPIKE Not Observed 06/06/2023   SPEI Comment 06/06/2023   Lab Results  Component Value Date   TIBC 342 06/06/2023   TIBC 313 03/07/2023   TIBC 362 02/04/2023   FERRITIN 41 06/06/2023   FERRITIN 369 (H) 03/07/2023   FERRITIN 31 02/04/2023   IRONPCTSAT 17 06/06/2023   IRONPCTSAT 36 (H) 03/07/2023   IRONPCTSAT 22 02/04/2023   Lab Results  Component Value Date   LDH 152 11/16/2022      STUDIES:   VAS US CAROTID Result Date: 08/06/2023 Carotid Arterial Duplex Study Patient Name:  [name] [name]  Date of Exam:   08/05/2023 Medical Rec #: [id number]        Accession #:    [id number] Date of Birth: 12-22-1940       Patient Gender: F Patient Age:   89 years Exam Location:  [place] Procedure:      VAS US CAROTID Referring Phys: Remi Deter MCDOWELL --------------------------------------------------------------------------------  Indications:       Carotid artery disease. Risk Factors:      Hypertension, hyperlipidemia, past history of smoking,                    coronary artery disease. Other Factors:     CKD                    Anemia                    Atrial fibrillation. Comparison Study:  Study on 08/22/2022 Rt ICA 60-79% Lt ICA 40-59% Performing Technologist: Dominica Severin RCS, RVS  Examination Guidelines: A complete evaluation includes B-mode imaging, spectral Doppler, color Doppler, and power Doppler as needed of all accessible portions of each vessel. Bilateral testing is considered an integral part of a complete examination. Limited examinations for reoccurring indications may be performed as noted.  Right Carotid Findings: +----------+--------+--------+--------+-------------------------+--------+           PSV cm/sEDV cm/sStenosisPlaque Description       Comments +----------+--------+--------+--------+-------------------------+--------+ CCA Prox  111     14                                                +----------+--------+--------+--------+-------------------------+--------+  CCA Distal94      16                                                +----------+--------+--------+--------+-------------------------+--------+ ICA Prox  209     52      40-59%  calcific and heterogenous         +----------+--------+--------+--------+-------------------------+--------+ ICA Mid   152     22                                                 +----------+--------+--------+--------+-------------------------+--------+ ICA Distal95      27                                                +----------+--------+--------+--------+-------------------------+--------+ ECA       153                     heterogenous                      +----------+--------+--------+--------+-------------------------+--------+ +----------+--------+-------+----------------+-------------------+           PSV cm/sEDV cmsDescribe        Arm Pressure (mmHG) +----------+--------+-------+----------------+-------------------+ [id number]            Multiphasic, [id number]                 +----------+--------+-------+----------------+-------------------+ +---------+--------+--+--------+---------+ VertebralPSV cm/s74EDV cm/sAntegrade +---------+--------+--+--------+---------+  Left Carotid Findings: +----------+--------+--------+--------+----------------------+--------+           PSV cm/sEDV cm/sStenosisPlaque Description    Comments +----------+--------+--------+--------+----------------------+--------+ CCA Prox  110     13                                             +----------+--------+--------+--------+----------------------+--------+ CCA Distal102     14                                             +----------+--------+--------+--------+----------------------+--------+ ICA Prox  274     38      40-59%  calcific and irregular         +----------+--------+--------+--------+----------------------+--------+ ICA Mid   213     26                                             +----------+--------+--------+--------+----------------------+--------+ ICA Distal137     27                                             +----------+--------+--------+--------+----------------------+--------+ ECA       263  calcific and irregular         +----------+--------+--------+--------+----------------------+--------+  +----------+--------+--------+---------+-------------------+           PSV cm/sEDV cm/sDescribe Arm Pressure (mmHG) +----------+--------+--------+---------+-------------------+ [id number]             [id number]                 +----------+--------+--------+---------+-------------------+ +---------+--------+--+--------+----------------------+ VertebralPSV cm/s45EDV cm/sAntegrade and Atypical +---------+--------+--+--------+----------------------+   Summary: Right Carotid: Velocities in the right ICA are consistent with a 40-59%                stenosis. Non-hemodynamically significant plaque <50% noted in                the CCA. The ECA appears <50% stenosed. Left Carotid: Velocities in the left ICA are consistent with a 40-59% stenosis.               The ECA appears <50% stenosed. Vertebrals:  Bilateral vertebral arteries demonstrate antegrade flow. Subclavians: Left subclavian artery flow was disturbed. Normal flow hemodynamics              were seen in the right subclavian artery. *See table(s) above for measurements and observations. Suggest follow up study in 12 months. Electronically signed by Julien Nordmann MD on 08/06/2023 at 12:43:29 PM.    Final

## 2023-08-29 ENCOUNTER — Other Ambulatory Visit: Payer: Self-pay

## 2023-08-29 ENCOUNTER — Inpatient Hospital Stay (HOSPITAL_BASED_OUTPATIENT_CLINIC_OR_DEPARTMENT_OTHER): Payer: Medicare Other | Admitting: *Deleted

## 2023-08-29 ENCOUNTER — Inpatient Hospital Stay: Payer: Medicare Other

## 2023-08-29 ENCOUNTER — Encounter: Payer: Self-pay | Admitting: Oncology

## 2023-08-29 ENCOUNTER — Inpatient Hospital Stay: Payer: Medicare Other | Admitting: Oncology

## 2023-08-29 VITALS — BP 135/62 | HR 51 | Temp 96.2°F | Resp 18 | Wt 145.5 lb

## 2023-08-29 DIAGNOSIS — R079 Chest pain, unspecified: Secondary | ICD-10-CM | POA: Diagnosis not present

## 2023-08-29 DIAGNOSIS — M2550 Pain in unspecified joint: Secondary | ICD-10-CM | POA: Diagnosis not present

## 2023-08-29 DIAGNOSIS — M25561 Pain in right knee: Secondary | ICD-10-CM | POA: Diagnosis not present

## 2023-08-29 DIAGNOSIS — E785 Hyperlipidemia, unspecified: Secondary | ICD-10-CM | POA: Diagnosis not present

## 2023-08-29 DIAGNOSIS — I129 Hypertensive chronic kidney disease with stage 1 through stage 4 chronic kidney disease, or unspecified chronic kidney disease: Secondary | ICD-10-CM | POA: Diagnosis not present

## 2023-08-29 DIAGNOSIS — D3A010 Benign carcinoid tumor of the duodenum: Secondary | ICD-10-CM

## 2023-08-29 DIAGNOSIS — D649 Anemia, unspecified: Secondary | ICD-10-CM

## 2023-08-29 DIAGNOSIS — K5900 Constipation, unspecified: Secondary | ICD-10-CM | POA: Diagnosis not present

## 2023-08-29 DIAGNOSIS — D631 Anemia in chronic kidney disease: Secondary | ICD-10-CM

## 2023-08-29 DIAGNOSIS — I2510 Atherosclerotic heart disease of native coronary artery without angina pectoris: Secondary | ICD-10-CM | POA: Diagnosis not present

## 2023-08-29 DIAGNOSIS — D500 Iron deficiency anemia secondary to blood loss (chronic): Secondary | ICD-10-CM

## 2023-08-29 DIAGNOSIS — N1832 Chronic kidney disease, stage 3b: Secondary | ICD-10-CM

## 2023-08-29 DIAGNOSIS — G8929 Other chronic pain: Secondary | ICD-10-CM | POA: Diagnosis not present

## 2023-08-29 DIAGNOSIS — R5383 Other fatigue: Secondary | ICD-10-CM | POA: Diagnosis not present

## 2023-08-29 DIAGNOSIS — E1122 Type 2 diabetes mellitus with diabetic chronic kidney disease: Secondary | ICD-10-CM | POA: Diagnosis not present

## 2023-08-29 DIAGNOSIS — Z7901 Long term (current) use of anticoagulants: Secondary | ICD-10-CM | POA: Diagnosis not present

## 2023-08-29 DIAGNOSIS — K922 Gastrointestinal hemorrhage, unspecified: Secondary | ICD-10-CM | POA: Diagnosis not present

## 2023-08-29 DIAGNOSIS — Z7982 Long term (current) use of aspirin: Secondary | ICD-10-CM | POA: Diagnosis not present

## 2023-08-29 DIAGNOSIS — I4891 Unspecified atrial fibrillation: Secondary | ICD-10-CM | POA: Diagnosis not present

## 2023-08-29 DIAGNOSIS — E538 Deficiency of other specified B group vitamins: Secondary | ICD-10-CM | POA: Diagnosis not present

## 2023-08-29 DIAGNOSIS — Z87891 Personal history of nicotine dependence: Secondary | ICD-10-CM | POA: Diagnosis not present

## 2023-08-29 DIAGNOSIS — Z79899 Other long term (current) drug therapy: Secondary | ICD-10-CM | POA: Diagnosis not present

## 2023-08-29 DIAGNOSIS — E039 Hypothyroidism, unspecified: Secondary | ICD-10-CM | POA: Diagnosis not present

## 2023-08-29 DIAGNOSIS — C7A010 Malignant carcinoid tumor of the duodenum: Secondary | ICD-10-CM | POA: Diagnosis not present

## 2023-08-29 DIAGNOSIS — D472 Monoclonal gammopathy: Secondary | ICD-10-CM | POA: Diagnosis not present

## 2023-08-29 LAB — COMPREHENSIVE METABOLIC PANEL
ALT: 18 U/L (ref 0–44)
AST: 25 U/L (ref 15–41)
Albumin: 3.7 g/dL (ref 3.5–5.0)
Alkaline Phosphatase: 88 U/L (ref 38–126)
Anion gap: 9 (ref 5–15)
BUN: 35 mg/dL — ABNORMAL HIGH (ref 8–23)
CO2: 20 mmol/L — ABNORMAL LOW (ref 22–32)
Calcium: 10.1 mg/dL (ref 8.9–10.3)
Chloride: 106 mmol/L (ref 98–111)
Creatinine, Ser: 1.68 mg/dL — ABNORMAL HIGH (ref 0.44–1.00)
GFR, Estimated: 30 mL/min — ABNORMAL LOW (ref >60)
Glucose, Bld: 103 mg/dL — ABNORMAL HIGH (ref 70–99)
Potassium: 4.2 mmol/L (ref 3.5–5.1)
Sodium: 135 mmol/L (ref 135–145)
Total Bilirubin: 0.5 mg/dL (ref 0.0–1.2)
Total Protein: 7.5 g/dL (ref 6.5–8.1)

## 2023-08-29 LAB — CBC WITH DIFFERENTIAL/PLATELET
Abs Immature Granulocytes: 0 10*3/uL (ref 0.00–0.07)
Basophils Absolute: 0 10*3/uL (ref 0.0–0.1)
Basophils Relative: 1 %
Eosinophils Absolute: 0.3 10*3/uL (ref 0.0–0.5)
Eosinophils Relative: 8 %
HCT: 34.8 % — ABNORMAL LOW (ref 36.0–46.0)
Hemoglobin: 10.9 g/dL — ABNORMAL LOW (ref 12.0–15.0)
Immature Granulocytes: 0 %
Lymphocytes Relative: 19 %
Lymphs Abs: 0.7 10*3/uL (ref 0.7–4.0)
MCH: 29.9 pg (ref 26.0–34.0)
MCHC: 31.3 g/dL (ref 30.0–36.0)
MCV: 95.6 fL (ref 80.0–100.0)
Monocytes Absolute: 0.4 10*3/uL (ref 0.1–1.0)
Monocytes Relative: 11 %
Neutro Abs: 2.4 10*3/uL (ref 1.7–7.7)
Neutrophils Relative %: 61 %
Platelets: 178 10*3/uL (ref 150–400)
RBC: 3.64 MIL/uL — ABNORMAL LOW (ref 3.87–5.11)
RDW: 14.9 % (ref 11.5–15.5)
WBC: 3.9 10*3/uL — ABNORMAL LOW (ref 4.0–10.5)
nRBC: 0 % (ref 0.0–0.2)

## 2023-08-29 LAB — PROTEIN / CREATININE RATIO, URINE
Creatinine, Urine: 44 mg/dL
Protein Creatinine Ratio: 0.2 mg/mg{creat} — ABNORMAL HIGH (ref 0.00–0.15)
Total Protein, Urine: 9 mg/dL

## 2023-08-29 LAB — VITAMIN B12: Vitamin B-12: 363 pg/mL (ref 180–914)

## 2023-08-29 LAB — FERRITIN: Ferritin: 141 ng/mL (ref 11–307)

## 2023-08-29 LAB — SAMPLE TO BLOOD BANK

## 2023-08-29 LAB — RENAL FUNCTION PANEL
Albumin: 3.8 g/dL (ref 3.5–5.0)
Anion gap: 10 (ref 5–15)
BUN: 36 mg/dL — ABNORMAL HIGH (ref 8–23)
CO2: 20 mmol/L — ABNORMAL LOW (ref 22–32)
Calcium: 10.1 mg/dL (ref 8.9–10.3)
Chloride: 106 mmol/L (ref 98–111)
Creatinine, Ser: 1.67 mg/dL — ABNORMAL HIGH (ref 0.44–1.00)
GFR, Estimated: 31 mL/min — ABNORMAL LOW (ref >60)
Glucose, Bld: 102 mg/dL — ABNORMAL HIGH (ref 70–99)
Phosphorus: 3.3 mg/dL (ref 2.5–4.6)
Potassium: 4.1 mmol/L (ref 3.5–5.1)
Sodium: 136 mmol/L (ref 135–145)

## 2023-08-29 LAB — IRON AND TIBC
Iron: 79 ug/dL (ref 28–170)
Saturation Ratios: 25 % (ref 10.4–31.8)
TIBC: 315 ug/dL (ref 250–450)
UIBC: 236 ug/dL

## 2023-08-29 MED ORDER — CYANOCOBALAMIN 1000 MCG/ML IJ SOLN
1000.0000 ug | Freq: Once | INTRAMUSCULAR | Status: AC
  Administered 2023-08-29: 1000 ug via INTRAMUSCULAR
  Filled 2023-08-29: qty 1

## 2023-08-29 MED ORDER — EPOETIN ALFA-EPBX 20000 UNIT/ML IJ SOLN
20000.0000 [IU] | Freq: Once | INTRAMUSCULAR | Status: AC
  Administered 2023-08-29: 20000 [IU] via SUBCUTANEOUS
  Filled 2023-08-29: qty 1

## 2023-08-29 NOTE — Patient Instructions (Signed)
 [facility] [place] - [facility] DE[facility]  Discharge Instructions: Thank you for choosing [facility] to provide your oncology and hematology care.  If you have a lab appointment with the Cancer Center - please note that after April 8th, 2024, all labs will be drawn in the cancer center.  You do not have to check in or register with the main entrance as you have in the past but will complete your check-in in the cancer center.  Wear comfortable clothing and clothing appropriate for easy access to any Portacath or PICC line.   We strive to give you quality time with your provider. You may need to reschedule your appointment if you arrive late (15 or more minutes).  Arriving late affects you and other patients whose appointments are after yours.  Also, if you miss three or more appointments without notifying the office, you may be dismissed from the clinic at the provider's discretion.      For prescription refill requests, have your pharmacy contact our office and allow 72 hours for refills to be completed.    Today you received the following:  Retacrit/Vitamin B12.  Epoetin Alfa Injection What is this medication? EPOETIN ALFA (e POE e tin AL fa) treats low levels of red blood cells (anemia) caused by kidney disease, chemotherapy, or HIV medications. It can also be used in people who are at risk for blood loss during surgery. It works by Systems analyst make more red blood cells, which reduces the need for blood transfusions. This medicine may be used for other purposes; ask your health care provider or pharmacist if you have questions. COMMON BRAND NAME(S): Epogen, Procrit, Retacrit What should I tell my care team before I take this medication? They need to know if you have any of these conditions: Blood clots Cancer Heart disease High blood pressure On dialysis Seizures Stroke An unusual or allergic reaction to epoetin alfa, albumin, benzyl  alcohol, other medications, foods, dyes, or preservatives Pregnant or trying to get pregnant Breast-feeding How should I use this medication? This medication is injected into a vein or under the skin. It is usually given by your care team in a hospital or clinic setting. It may also be given at home. If you get this medication at home, you will be taught how to prepare and give it. Use exactly as directed. Take it as directed on the prescription label at the same time every day. Keep taking it unless your care team tells you to stop. It is important that you put your used needles and syringes in a special sharps container. Do not put them in a trash can. If you do not have a sharps container, call your pharmacist or care team to get one. A special MedGuide will be given to you by the pharmacist with each prescription and refill. Be sure to read this information carefully each time. Talk to your care team about the use of this medication in children. While this medication may be used in children as young as 1 month of age for selected conditions, precautions do apply. Overdosage: If you think you have taken too much of this medicine contact a poison control center or emergency room at once. NOTE: This medicine is only for you. Do not share this medicine with others. What if I miss a dose? If you miss a dose, take it as soon as you can. If it is almost time for your  next dose, take only that dose. Do not take double or extra doses. What may interact with this medication? Darbepoetin alfa Methoxy polyethylene glycol-epoetin beta This list may not describe all possible interactions. Give your health care provider a list of all the medicines, herbs, non-prescription drugs, or dietary supplements you use. Also tell them if you smoke, drink alcohol, or use illegal drugs. Some items may interact with your medicine. What should I watch for while using this medication? Visit your care team for regular checks  on your progress. Check your blood pressure as directed. Know what your blood pressure should be and when to contact your care team. Your condition will be monitored carefully while you are receiving this medication. You may need blood work while taking this medication. What side effects may I notice from receiving this medication? Side effects that you should report to your care team as soon as possible: Allergic reactions--skin rash, itching, hives, swelling of the face, lips, tongue, or throat Blood clot--pain, swelling, or warmth in the leg, shortness of breath, chest pain Heart attack--pain or tightness in the chest, shoulders, arms, or jaw, nausea, shortness of breath, cold or clammy skin, feeling faint or lightheaded Increase in blood pressure Rash, fever, and swollen lymph nodes Redness, blistering, peeling, or loosening of the skin, including inside the mouth Seizures Stroke--sudden numbness or weakness of the face, arm, or leg, trouble speaking, confusion, trouble walking, loss of balance or coordination, dizziness, severe headache, change in vision Side effects that usually do not require medical attention (report to your care team if they continue or are bothersome): Bone, joint, or muscle pain Cough Headache Nausea Pain, redness, or irritation at injection site This list may not describe all possible side effects. Call your doctor for medical advice about side effects. You may report side effects to FDA at 1-800-FDA-1088. Where should I keep my medication? Keep out of the reach of children and pets. Store in a refrigerator. Do not freeze. Do not shake. Protect from light. Keep this medication in the original container until you are ready to take it. See product for storage information. Get rid of any unused medication after the expiration date. To get rid of medications that are no longer needed or have expired: Take the medication to a medication take-back program. Check with  your pharmacy or law enforcement to find a location. If you cannot return the medication, ask your pharmacist or care team how to get rid of the medication safely. NOTE: This sheet is a summary. It may not cover all possible information. If you have questions about this medicine, talk to your doctor, pharmacist, or health care provider.  2024 Elsevier/Gold Standard (2021-10-20 00:00:00)    Vitamin B12 Injection What is this medication? Vitamin B12 (VAHY tuh min B12) prevents and treats low vitamin B12 levels in your body. It is used in people who do not get enough vitamin B12 from their diet or when their digestive tract does not absorb enough. Vitamin B12 plays an important role in maintaining the health of your nervous system and red blood cells. This medicine may be used for other purposes; ask your health care provider or pharmacist if you have questions. COMMON BRAND NAME(S): B-12 Compliance Kit, B-12 Injection Kit, Cyomin, Dodex, LA-12, Nutri-Twelve, Physicians EZ Use B-12, Primabalt, Vitamin Deficiency Injectable System - B12 What should I tell my care team before I take this medication? They need to know if you have any of these conditions: Kidney disease Leber's disease Megaloblastic  anemia An unusual or allergic reaction to cyanocobalamin, cobalt, other medications, foods, dyes, or preservatives Pregnant or trying to get pregnant Breast-feeding How should I use this medication? This medication is injected into a muscle or deeply under the skin. It is usually given in a clinic or care team's office. However, your care team may teach you how to inject yourself. Follow all instructions. Talk to your care team about the use of this medication in children. Special care may be needed. Overdosage: If you think you have taken too much of this medicine contact a poison control center or emergency room at once. NOTE: This medicine is only for you. Do not share this medicine with  others. What if I miss a dose? If you are given your dose at a clinic or care team's office, call to reschedule your appointment. If you give your own injections, and you miss a dose, take it as soon as you can. If it is almost time for your next dose, take only that dose. Do not take double or extra doses. What may interact with this medication? Alcohol Colchicine This list may not describe all possible interactions. Give your health care provider a list of all the medicines, herbs, non-prescription drugs, or dietary supplements you use. Also tell them if you smoke, drink alcohol, or use illegal drugs. Some items may interact with your medicine. What should I watch for while using this medication? Visit your care team regularly. You may need blood work done while you are taking this medication. You may need to follow a special diet. Talk to your care team. Limit your alcohol intake and avoid smoking to get the best benefit. What side effects may I notice from receiving this medication? Side effects that you should report to your care team as soon as possible: Allergic reactions--skin rash, itching, hives, swelling of the face, lips, tongue, or throat Swelling of the ankles, hands, or feet Trouble breathing Side effects that usually do not require medical attention (report to your care team if they continue or are bothersome): Diarrhea This list may not describe all possible side effects. Call your doctor for medical advice about side effects. You may report side effects to FDA at 1-800-FDA-1088. Where should I keep my medication? Keep out of the reach of children. Store at room temperature between 15 and 30 degrees C (59 and 85 degrees F). Protect from light. Throw away any unused medication after the expiration date. NOTE: This sheet is a summary. It may not cover all possible information. If you have questions about this medicine, talk to your doctor, pharmacist, or health care provider.   2024 Elsevier/Gold Standard (2021-02-28 00:00:00)     To help prevent nausea and vomiting after your treatment, we encourage you to take your nausea medication as directed.  BELOW ARE SYMPTOMS THAT SHOULD BE REPORTED IMMEDIATELY: *FEVER GREATER THAN 100.4 F (38 C) OR HIGHER *CHILLS OR SWEATING *NAUSEA AND VOMITING THAT IS NOT CONTROLLED WITH YOUR NAUSEA MEDICATION *UNUSUAL SHORTNESS OF BREATH *UNUSUAL BRUISING OR BLEEDING *URINARY PROBLEMS (pain or burning when urinating, or frequent urination) *BOWEL PROBLEMS (unusual diarrhea, constipation, pain near the anus) TENDERNESS IN MOUTH AND THROAT WITH OR WITHOUT PRESENCE OF ULCERS (sore throat, sores in mouth, or a toothache) UNUSUAL RASH, SWELLING OR PAIN  UNUSUAL VAGINAL DISCHARGE OR ITCHING   Items with * indicate a potential emergency and should be followed up as soon as possible or go to the Emergency Department if any problems should occur.  Please  show the CHEMOTHERAPY ALERT CARD or IMMUNOTHERAPY ALERT CARD at check-in to the Emergency Department and triage nurse.  Should you have questions after your visit or need to cancel or reschedule your appointment, please contact [facility]  - A DEPT OF [facility] [phone]  and follow the prompts.  Office hours are 8:00 a.m. to 4:30 p.m. Monday - Friday. Please note that voicemails left after 4:00 p.m. may not be returned until the following business day.  We are closed weekends and major holidays. You have access to a nurse at all times for urgent questions. Please call the main number to the clinic [phone] and follow the prompts.  For any non-urgent questions, you may also contact your provider using MyChart. We now offer e-Visits for anyone 31 and older to request care online for non-urgent symptoms. For details visit [email or web address].   Also download the MyChart app! Go to the app store, search "MyChart", open the app, select [place], and  log in with your MyChart username and password.

## 2023-08-29 NOTE — Progress Notes (Signed)
 Hemoglobin today is 10.9.  We will proceed with Retacrit and Vitamin B12 injections per provider orders.   Patient tolerated injections well with no complaints voiced.  Patient left via wheelchair in stable condition.  Vital signs stable at discharge.  Follow up as scheduled.

## 2023-09-04 ENCOUNTER — Other Ambulatory Visit: Payer: Self-pay | Admitting: Family Medicine

## 2023-09-05 DIAGNOSIS — N1832 Chronic kidney disease, stage 3b: Secondary | ICD-10-CM | POA: Diagnosis not present

## 2023-09-05 DIAGNOSIS — R809 Proteinuria, unspecified: Secondary | ICD-10-CM | POA: Diagnosis not present

## 2023-09-05 DIAGNOSIS — I5032 Chronic diastolic (congestive) heart failure: Secondary | ICD-10-CM | POA: Diagnosis not present

## 2023-09-05 DIAGNOSIS — D638 Anemia in other chronic diseases classified elsewhere: Secondary | ICD-10-CM | POA: Diagnosis not present

## 2023-09-17 ENCOUNTER — Other Ambulatory Visit: Payer: Self-pay | Admitting: Family Medicine

## 2023-09-26 ENCOUNTER — Inpatient Hospital Stay: Payer: Medicare Other

## 2023-09-26 ENCOUNTER — Inpatient Hospital Stay: Payer: Medicare Other | Attending: Hematology

## 2023-09-26 VITALS — BP 110/56 | HR 77 | Temp 97.9°F | Resp 17

## 2023-09-26 DIAGNOSIS — N1832 Chronic kidney disease, stage 3b: Secondary | ICD-10-CM | POA: Diagnosis not present

## 2023-09-26 DIAGNOSIS — D500 Iron deficiency anemia secondary to blood loss (chronic): Secondary | ICD-10-CM

## 2023-09-26 DIAGNOSIS — Z79899 Other long term (current) drug therapy: Secondary | ICD-10-CM | POA: Insufficient documentation

## 2023-09-26 DIAGNOSIS — D631 Anemia in chronic kidney disease: Secondary | ICD-10-CM | POA: Insufficient documentation

## 2023-09-26 DIAGNOSIS — C7A010 Malignant carcinoid tumor of the duodenum: Secondary | ICD-10-CM | POA: Insufficient documentation

## 2023-09-26 DIAGNOSIS — D649 Anemia, unspecified: Secondary | ICD-10-CM

## 2023-09-26 LAB — CBC WITH DIFFERENTIAL/PLATELET
Abs Immature Granulocytes: 0.01 10*3/uL (ref 0.00–0.07)
Basophils Absolute: 0 10*3/uL (ref 0.0–0.1)
Basophils Relative: 1 %
Eosinophils Absolute: 0.4 10*3/uL (ref 0.0–0.5)
Eosinophils Relative: 9 %
HCT: 32 % — ABNORMAL LOW (ref 36.0–46.0)
Hemoglobin: 9.9 g/dL — ABNORMAL LOW (ref 12.0–15.0)
Immature Granulocytes: 0 %
Lymphocytes Relative: 23 %
Lymphs Abs: 1 10*3/uL (ref 0.7–4.0)
MCH: 29.1 pg (ref 26.0–34.0)
MCHC: 30.9 g/dL (ref 30.0–36.0)
MCV: 94.1 fL (ref 80.0–100.0)
Monocytes Absolute: 0.4 10*3/uL (ref 0.1–1.0)
Monocytes Relative: 10 %
Neutro Abs: 2.4 10*3/uL (ref 1.7–7.7)
Neutrophils Relative %: 57 %
Platelets: 146 10*3/uL — ABNORMAL LOW (ref 150–400)
RBC: 3.4 MIL/uL — ABNORMAL LOW (ref 3.87–5.11)
RDW: 14 % (ref 11.5–15.5)
WBC: 4.2 10*3/uL (ref 4.0–10.5)
nRBC: 0 % (ref 0.0–0.2)

## 2023-09-26 LAB — SAMPLE TO BLOOD BANK

## 2023-09-26 MED ORDER — EPOETIN ALFA-EPBX 10000 UNIT/ML IJ SOLN
20000.0000 [IU] | Freq: Once | INTRAMUSCULAR | Status: AC
  Administered 2023-09-26: 20000 [IU] via SUBCUTANEOUS
  Filled 2023-09-26: qty 2

## 2023-09-26 MED ORDER — CYANOCOBALAMIN 1000 MCG/ML IJ SOLN
1000.0000 ug | Freq: Once | INTRAMUSCULAR | Status: AC
  Administered 2023-09-26: 1000 ug via INTRAMUSCULAR
  Filled 2023-09-26: qty 1

## 2023-09-26 NOTE — Progress Notes (Signed)
 Patient tolerated injection with no complaints voiced. Site clean and dry with no bruising or swelling noted at site. See MAR for details. Band aid applied.  Patient stable during and after injection.  Patient presents today for Retacrit injection. Hemoglobin reviewed prior to administration. VSS tolerated without incident or complaint. See MAR for details. Patient stable during and after injection. Patient discharged in satisfactory condition with no s/s of distress noted.

## 2023-09-26 NOTE — Patient Instructions (Signed)
 [facility] [place] - A DEPT [facility]  Discharge Instructions: Thank you for choosing [facility] to provide your oncology and hematology care.  If you have a lab appointment with the Cancer Center - please note that after April 8th, 2024, all labs will be drawn in the cancer center.  You do not have to check in or register with the main entrance as you have in the past but will complete your check-in in the cancer center.  Wear comfortable clothing and clothing appropriate for easy access to any Portacath or PICC line.   We strive to give you quality time with your provider. You may need to reschedule your appointment if you arrive late (15 or more minutes).  Arriving late affects you and other patients whose appointments are after yours.  Also, if you miss three or more appointments without notifying the office, you may be dismissed from the clinic at the provider's discretion.      For prescription refill requests, have your pharmacy contact our office and allow 72 hours for refills to be completed.    Today you received the following B12 injection and retacrit, return as scheduled.   To help prevent nausea and vomiting after your treatment, we encourage you to take your nausea medication as directed.  BELOW ARE SYMPTOMS THAT SHOULD BE REPORTED IMMEDIATELY: *FEVER GREATER THAN 100.4 F (38 C) OR HIGHER *CHILLS OR SWEATING *NAUSEA AND VOMITING THAT IS NOT CONTROLLED WITH YOUR NAUSEA MEDICATION *UNUSUAL SHORTNESS OF BREATH *UNUSUAL BRUISING OR BLEEDING *URINARY PROBLEMS (pain or burning when urinating, or frequent urination) *BOWEL PROBLEMS (unusual diarrhea, constipation, pain near the anus) TENDERNESS IN MOUTH AND THROAT WITH OR WITHOUT PRESENCE OF ULCERS (sore throat, sores in mouth, or a toothache) UNUSUAL RASH, SWELLING OR PAIN  UNUSUAL VAGINAL DISCHARGE OR ITCHING   Items with * indicate a potential emergency and should be followed up as  soon as possible or go to the Emergency Department if any problems should occur.  Please show the CHEMOTHERAPY ALERT CARD or IMMUNOTHERAPY ALERT CARD at check-in to the Emergency Department and triage nurse.  Should you have questions after your visit or need to cancel or reschedule your appointment, please contact [facility]  - A DEPT OF [facility] [phone]  and follow the prompts.  Office hours are 8:00 a.m. to 4:30 p.m. Monday - Friday. Please note that voicemails left after 4:00 p.m. may not be returned until the following business day.  We are closed weekends and major holidays. You have access to a nurse at all times for urgent questions. Please call the main number to the clinic [phone] and follow the prompts.  For any non-urgent questions, you may also contact your provider using MyChart. We now offer e-Visits for anyone 67 and older to request care online for non-urgent symptoms. For details visit [email or web address].   Also download the MyChart app! Go to the app store, search "MyChart", open the app, select [place], and log in with your MyChart username and password.

## 2023-10-01 ENCOUNTER — Other Ambulatory Visit: Payer: Self-pay | Admitting: Nurse Practitioner

## 2023-10-07 ENCOUNTER — Other Ambulatory Visit: Payer: Self-pay | Admitting: Nurse Practitioner

## 2023-10-11 ENCOUNTER — Other Ambulatory Visit: Payer: Self-pay | Admitting: Family Medicine

## 2023-10-16 ENCOUNTER — Ambulatory Visit: Payer: Medicare Other | Admitting: Family Medicine

## 2023-10-17 ENCOUNTER — Ambulatory Visit (INDEPENDENT_AMBULATORY_CARE_PROVIDER_SITE_OTHER): Payer: Medicare Other | Admitting: Gastroenterology

## 2023-10-18 ENCOUNTER — Ambulatory Visit: Payer: Medicare Other | Admitting: Family Medicine

## 2023-10-24 ENCOUNTER — Inpatient Hospital Stay: Payer: Medicare Other

## 2023-10-24 ENCOUNTER — Inpatient Hospital Stay: Payer: Medicare Other | Attending: Hematology

## 2023-10-24 VITALS — BP 138/71 | HR 58 | Temp 96.8°F | Resp 18

## 2023-10-24 DIAGNOSIS — N1832 Chronic kidney disease, stage 3b: Secondary | ICD-10-CM | POA: Insufficient documentation

## 2023-10-24 DIAGNOSIS — D631 Anemia in chronic kidney disease: Secondary | ICD-10-CM | POA: Diagnosis not present

## 2023-10-24 DIAGNOSIS — C7A010 Malignant carcinoid tumor of the duodenum: Secondary | ICD-10-CM | POA: Insufficient documentation

## 2023-10-24 DIAGNOSIS — E538 Deficiency of other specified B group vitamins: Secondary | ICD-10-CM | POA: Insufficient documentation

## 2023-10-24 DIAGNOSIS — Z79899 Other long term (current) drug therapy: Secondary | ICD-10-CM | POA: Insufficient documentation

## 2023-10-24 DIAGNOSIS — D500 Iron deficiency anemia secondary to blood loss (chronic): Secondary | ICD-10-CM

## 2023-10-24 DIAGNOSIS — D649 Anemia, unspecified: Secondary | ICD-10-CM

## 2023-10-24 DIAGNOSIS — I129 Hypertensive chronic kidney disease with stage 1 through stage 4 chronic kidney disease, or unspecified chronic kidney disease: Secondary | ICD-10-CM | POA: Diagnosis not present

## 2023-10-24 LAB — SAMPLE TO BLOOD BANK

## 2023-10-24 LAB — CBC WITH DIFFERENTIAL/PLATELET
Abs Immature Granulocytes: 0.01 10*3/uL (ref 0.00–0.07)
Basophils Absolute: 0.1 10*3/uL (ref 0.0–0.1)
Basophils Relative: 1 %
Eosinophils Absolute: 0.3 10*3/uL (ref 0.0–0.5)
Eosinophils Relative: 6 %
HCT: 33.7 % — ABNORMAL LOW (ref 36.0–46.0)
Hemoglobin: 10.7 g/dL — ABNORMAL LOW (ref 12.0–15.0)
Immature Granulocytes: 0 %
Lymphocytes Relative: 15 %
Lymphs Abs: 0.7 10*3/uL (ref 0.7–4.0)
MCH: 29.3 pg (ref 26.0–34.0)
MCHC: 31.8 g/dL (ref 30.0–36.0)
MCV: 92.3 fL (ref 80.0–100.0)
Monocytes Absolute: 0.3 10*3/uL (ref 0.1–1.0)
Monocytes Relative: 7 %
Neutro Abs: 3.4 10*3/uL (ref 1.7–7.7)
Neutrophils Relative %: 71 %
Platelets: 162 10*3/uL (ref 150–400)
RBC: 3.65 MIL/uL — ABNORMAL LOW (ref 3.87–5.11)
RDW: 14.5 % (ref 11.5–15.5)
WBC: 4.8 10*3/uL (ref 4.0–10.5)
nRBC: 0 % (ref 0.0–0.2)

## 2023-10-24 MED ORDER — EPOETIN ALFA-EPBX 40000 UNIT/ML IJ SOLN
20000.0000 [IU] | Freq: Once | INTRAMUSCULAR | Status: DC
Start: 2023-10-24 — End: 2023-10-24

## 2023-10-24 MED ORDER — EPOETIN ALFA-EPBX 10000 UNIT/ML IJ SOLN
20000.0000 [IU] | Freq: Once | INTRAMUSCULAR | Status: AC
  Administered 2023-10-24: 20000 [IU] via SUBCUTANEOUS
  Filled 2023-10-24: qty 2

## 2023-10-24 MED ORDER — CYANOCOBALAMIN 1000 MCG/ML IJ SOLN
1000.0000 ug | Freq: Once | INTRAMUSCULAR | Status: AC
  Administered 2023-10-24: 1000 ug via INTRAMUSCULAR
  Filled 2023-10-24: qty 1

## 2023-10-24 NOTE — Progress Notes (Signed)
 [name] [name] presents today for injection per the provider's orders.  Retacrit  20,000U and B12  administration without incident; injection site WNL; see MAR for injection details.  Patient tolerated procedure well and without incident.  No questions or complaints noted at this time. Patient's hemoglobin noted to be 10.7 today.  Discharged from clinic via wheelchair in stable condition. Alert and oriented x 3. F/U with [facility] as scheduled.

## 2023-10-24 NOTE — Patient Instructions (Signed)
 [facility] [place] - A DEPT OF [facility]  Discharge Instructions: Thank you for choosing [facility] to provide your oncology and hematology care.  If you have a lab appointment with the Cancer Center - please note that after April 8th, 2024, all labs will be drawn in the cancer center.  You do not have to check in or register with the main entrance as you have in the past but will complete your check-in in the cancer center.  Wear comfortable clothing and clothing appropriate for easy access to any Portacath or PICC line.   We strive to give you quality time with your provider. You may need to reschedule your appointment if you arrive late (15 or more minutes).  Arriving late affects you and other patients whose appointments are after yours.  Also, if you miss three or more appointments without notifying the office, you may be dismissed from the clinic at the provider's discretion.      For prescription refill requests, have your pharmacy contact our office and allow 72 hours for refills to be completed.    Today you received Retacrit  20,000U and B12 injections     BELOW ARE SYMPTOMS THAT SHOULD BE REPORTED IMMEDIATELY: *FEVER GREATER THAN 100.4 F (38 C) OR HIGHER *CHILLS OR SWEATING *NAUSEA AND VOMITING THAT IS NOT CONTROLLED WITH YOUR NAUSEA MEDICATION *UNUSUAL SHORTNESS OF BREATH *UNUSUAL BRUISING OR BLEEDING *URINARY PROBLEMS (pain or burning when urinating, or frequent urination) *BOWEL PROBLEMS (unusual diarrhea, constipation, pain near the anus) TENDERNESS IN MOUTH AND THROAT WITH OR WITHOUT PRESENCE OF ULCERS (sore throat, sores in mouth, or a toothache) UNUSUAL RASH, SWELLING OR PAIN  UNUSUAL VAGINAL DISCHARGE OR ITCHING   Items with * indicate a potential emergency and should be followed up as soon as possible or go to the Emergency Department if any problems should occur.  Please show the CHEMOTHERAPY ALERT CARD or IMMUNOTHERAPY ALERT  CARD at check-in to the Emergency Department and triage nurse.  Should you have questions after your visit or need to cancel or reschedule your appointment, please contact [facility] [place] - A DEPT OF [facility] [phone]  and follow the prompts.  Office hours are 8:00 a.m. to 4:30 p.m. Monday - Friday. Please note that voicemails left after 4:00 p.m. may not be returned until the following business day.  We are closed weekends and major holidays. You have access to a nurse at all times for urgent questions. Please call the main number to the clinic [phone] and follow the prompts.  For any non-urgent questions, you may also contact your provider using MyChart. We now offer e-Visits for anyone 58 and older to request care online for non-urgent symptoms. For details visit [email or web address].   Also download the MyChart app! Go to the app store, search "MyChart", open the app, select [place], and log in with your MyChart username and password.

## 2023-11-08 ENCOUNTER — Ambulatory Visit: Admitting: Family Medicine

## 2023-11-24 ENCOUNTER — Other Ambulatory Visit: Payer: Self-pay | Admitting: Family Medicine

## 2023-11-28 ENCOUNTER — Inpatient Hospital Stay: Payer: Medicare Other

## 2023-11-28 ENCOUNTER — Inpatient Hospital Stay: Payer: Medicare Other | Attending: Hematology | Admitting: Oncology

## 2023-11-28 VITALS — BP 124/71 | HR 52 | Temp 97.8°F | Resp 17 | Ht 60.0 in | Wt 136.0 lb

## 2023-11-28 DIAGNOSIS — C7A010 Malignant carcinoid tumor of the duodenum: Secondary | ICD-10-CM | POA: Insufficient documentation

## 2023-11-28 DIAGNOSIS — D649 Anemia, unspecified: Secondary | ICD-10-CM | POA: Diagnosis not present

## 2023-11-28 DIAGNOSIS — E538 Deficiency of other specified B group vitamins: Secondary | ICD-10-CM | POA: Insufficient documentation

## 2023-11-28 DIAGNOSIS — Z8379 Family history of other diseases of the digestive system: Secondary | ICD-10-CM | POA: Diagnosis not present

## 2023-11-28 DIAGNOSIS — I2510 Atherosclerotic heart disease of native coronary artery without angina pectoris: Secondary | ICD-10-CM | POA: Insufficient documentation

## 2023-11-28 DIAGNOSIS — N1832 Chronic kidney disease, stage 3b: Secondary | ICD-10-CM

## 2023-11-28 DIAGNOSIS — D631 Anemia in chronic kidney disease: Secondary | ICD-10-CM | POA: Diagnosis not present

## 2023-11-28 DIAGNOSIS — Z888 Allergy status to other drugs, medicaments and biological substances status: Secondary | ICD-10-CM | POA: Diagnosis not present

## 2023-11-28 DIAGNOSIS — Z87891 Personal history of nicotine dependence: Secondary | ICD-10-CM | POA: Diagnosis not present

## 2023-11-28 DIAGNOSIS — Z825 Family history of asthma and other chronic lower respiratory diseases: Secondary | ICD-10-CM | POA: Insufficient documentation

## 2023-11-28 DIAGNOSIS — D500 Iron deficiency anemia secondary to blood loss (chronic): Secondary | ICD-10-CM | POA: Diagnosis not present

## 2023-11-28 DIAGNOSIS — Z885 Allergy status to narcotic agent status: Secondary | ICD-10-CM | POA: Insufficient documentation

## 2023-11-28 DIAGNOSIS — G8929 Other chronic pain: Secondary | ICD-10-CM | POA: Insufficient documentation

## 2023-11-28 DIAGNOSIS — Z79890 Hormone replacement therapy (postmenopausal): Secondary | ICD-10-CM | POA: Diagnosis not present

## 2023-11-28 DIAGNOSIS — Z803 Family history of malignant neoplasm of breast: Secondary | ICD-10-CM | POA: Insufficient documentation

## 2023-11-28 DIAGNOSIS — Z841 Family history of disorders of kidney and ureter: Secondary | ICD-10-CM | POA: Insufficient documentation

## 2023-11-28 DIAGNOSIS — I4891 Unspecified atrial fibrillation: Secondary | ICD-10-CM | POA: Diagnosis not present

## 2023-11-28 DIAGNOSIS — Z7901 Long term (current) use of anticoagulants: Secondary | ICD-10-CM | POA: Diagnosis not present

## 2023-11-28 DIAGNOSIS — Z800 Family history of malignant neoplasm of digestive organs: Secondary | ICD-10-CM | POA: Insufficient documentation

## 2023-11-28 DIAGNOSIS — D472 Monoclonal gammopathy: Secondary | ICD-10-CM | POA: Insufficient documentation

## 2023-11-28 DIAGNOSIS — Z822 Family history of deafness and hearing loss: Secondary | ICD-10-CM | POA: Insufficient documentation

## 2023-11-28 DIAGNOSIS — E785 Hyperlipidemia, unspecified: Secondary | ICD-10-CM | POA: Insufficient documentation

## 2023-11-28 DIAGNOSIS — K5900 Constipation, unspecified: Secondary | ICD-10-CM | POA: Diagnosis not present

## 2023-11-28 DIAGNOSIS — N183 Chronic kidney disease, stage 3 unspecified: Secondary | ICD-10-CM | POA: Insufficient documentation

## 2023-11-28 DIAGNOSIS — D3A010 Benign carcinoid tumor of the duodenum: Secondary | ICD-10-CM

## 2023-11-28 DIAGNOSIS — E039 Hypothyroidism, unspecified: Secondary | ICD-10-CM | POA: Insufficient documentation

## 2023-11-28 DIAGNOSIS — Z833 Family history of diabetes mellitus: Secondary | ICD-10-CM | POA: Insufficient documentation

## 2023-11-28 DIAGNOSIS — Z7982 Long term (current) use of aspirin: Secondary | ICD-10-CM | POA: Insufficient documentation

## 2023-11-28 DIAGNOSIS — I129 Hypertensive chronic kidney disease with stage 1 through stage 4 chronic kidney disease, or unspecified chronic kidney disease: Secondary | ICD-10-CM | POA: Insufficient documentation

## 2023-11-28 DIAGNOSIS — Z8249 Family history of ischemic heart disease and other diseases of the circulatory system: Secondary | ICD-10-CM | POA: Insufficient documentation

## 2023-11-28 DIAGNOSIS — E669 Obesity, unspecified: Secondary | ICD-10-CM | POA: Diagnosis not present

## 2023-11-28 DIAGNOSIS — Z79899 Other long term (current) drug therapy: Secondary | ICD-10-CM | POA: Insufficient documentation

## 2023-11-28 LAB — COMPREHENSIVE METABOLIC PANEL WITH GFR
ALT: 16 U/L (ref 0–44)
AST: 22 U/L (ref 15–41)
Albumin: 3.8 g/dL (ref 3.5–5.0)
Alkaline Phosphatase: 87 U/L (ref 38–126)
Anion gap: 10 (ref 5–15)
BUN: 44 mg/dL — ABNORMAL HIGH (ref 8–23)
CO2: 18 mmol/L — ABNORMAL LOW (ref 22–32)
Calcium: 10 mg/dL (ref 8.9–10.3)
Chloride: 109 mmol/L (ref 98–111)
Creatinine, Ser: 1.82 mg/dL — ABNORMAL HIGH (ref 0.44–1.00)
GFR, Estimated: 28 mL/min — ABNORMAL LOW (ref >60)
Glucose, Bld: 105 mg/dL — ABNORMAL HIGH (ref 70–99)
Potassium: 4.3 mmol/L (ref 3.5–5.1)
Sodium: 137 mmol/L (ref 135–145)
Total Bilirubin: 0.3 mg/dL (ref 0.0–1.2)
Total Protein: 7.7 g/dL (ref 6.5–8.1)

## 2023-11-28 LAB — CBC WITH DIFFERENTIAL/PLATELET
Abs Immature Granulocytes: 0.01 10*3/uL (ref 0.00–0.07)
Basophils Absolute: 0 10*3/uL (ref 0.0–0.1)
Basophils Relative: 1 %
Eosinophils Absolute: 0.4 10*3/uL (ref 0.0–0.5)
Eosinophils Relative: 8 %
HCT: 29.8 % — ABNORMAL LOW (ref 36.0–46.0)
Hemoglobin: 9.5 g/dL — ABNORMAL LOW (ref 12.0–15.0)
Immature Granulocytes: 0 %
Lymphocytes Relative: 21 %
Lymphs Abs: 1 10*3/uL (ref 0.7–4.0)
MCH: 30.4 pg (ref 26.0–34.0)
MCHC: 31.9 g/dL (ref 30.0–36.0)
MCV: 95.2 fL (ref 80.0–100.0)
Monocytes Absolute: 0.4 10*3/uL (ref 0.1–1.0)
Monocytes Relative: 8 %
Neutro Abs: 2.9 10*3/uL (ref 1.7–7.7)
Neutrophils Relative %: 62 %
Platelets: 174 10*3/uL (ref 150–400)
RBC: 3.13 MIL/uL — ABNORMAL LOW (ref 3.87–5.11)
RDW: 15 % (ref 11.5–15.5)
WBC: 4.6 10*3/uL (ref 4.0–10.5)
nRBC: 0 % (ref 0.0–0.2)

## 2023-11-28 LAB — VITAMIN B12: Vitamin B-12: 450 pg/mL (ref 180–914)

## 2023-11-28 LAB — IRON AND TIBC
Iron: 73 ug/dL (ref 28–170)
Saturation Ratios: 20 % (ref 10.4–31.8)
TIBC: 367 ug/dL (ref 250–450)
UIBC: 294 ug/dL

## 2023-11-28 LAB — SAMPLE TO BLOOD BANK

## 2023-11-28 LAB — FERRITIN: Ferritin: 30 ng/mL (ref 11–307)

## 2023-11-28 MED ORDER — EPOETIN ALFA-EPBX 40000 UNIT/ML IJ SOLN: 20000.0000 [IU] | Freq: Once | INTRAMUSCULAR | Status: DC

## 2023-11-28 MED ORDER — CYANOCOBALAMIN 1000 MCG/ML IJ SOLN
1000.0000 ug | Freq: Once | INTRAMUSCULAR | Status: AC
  Administered 2023-11-28: 1000 ug via INTRAMUSCULAR
  Filled 2023-11-28: qty 1

## 2023-11-28 MED ORDER — EPOETIN ALFA-EPBX 10000 UNIT/ML IJ SOLN
20000.0000 [IU] | Freq: Once | INTRAMUSCULAR | Status: AC
  Administered 2023-11-28: 20000 [IU] via SUBCUTANEOUS

## 2023-11-28 NOTE — Progress Notes (Signed)
 [facility] [address], [place] [address] /   Clinic Day:  11/28/2023  Referring physician: Towanda Fret, MD  Patient Care Team: Towanda Fret, MD as PCP - General (Family Medicine) Gerard Knight, MD as PCP - Cardiology (Cardiology) Deena Farrier, MD as Consulting Physician (Cardiology) Devin Foerster, MD as Consulting Physician (Optometry) Ashok Blake, DPM as Consulting Physician (Podiatry) Lasalle Pointer, NP as Nurse Practitioner (Cardiology) Paulett Boros, MD as Medical Oncologist (Hematology and Oncology)   ASSESSMENT & PLAN:   Assessment: 1.  Duodenal neuroendocrine tumor: - 04/27/2021 (duodenal mass biopsy): Well-differentiated carcinoid tumor, grade 1, Ki-67 1%. - Dotatate PET (05/2021): No focal activity within the duodenum or evidence of metastatic disease. - EGD/EUS (08/2021): Submucosal nodule found in the duodenum-consistent with previously biopsied duodenal NET.  An intramural subepithelial lesion found in the second part of the duodenum.  Lesion appears to originate from within the deep mucosa and submucosa.  She was not felt to be candidate for EMR. - Patient had traumatic experience after the EUS and did not want to pursue further options and declined a referral to [facility]. - Denies any diarrhea/flushing/wheezing/abdominal pain.  No obstructive symptoms.   2.  Normocytic anemia: - More than 5-year history of iron  deficiency anemia secondary to GI bleed.  She underwent multiple EGDs and colonoscopies and 2 small bowel capsule endoscopies. - She reports taking iron  tablets daily for the past 3 years.  Denies any ice pica. - Denies any BRBPR/melena.  She had blood transfusion 01/09/2023. -She also receives intermittent IV iron .  Last IV Feraheme  was given on 03/01/2023.    3.  Social/family history: - Lives at home with her husband.  Independent of ADLs and IADLs.  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  No chemical exposure.  Smoked half pack per day starting at age 52 and quit in 2005. - Brother had colon cancer.  Older brother had liver cancer.   Plan: 1.  Normocytic anemia: - Multifactorial anemia from CKD, iron  deficiency and chronic blood loss.  -No evidence of bleeding per patient.  -She is status post several blood transfusions last given on 01/30/2023 for hemoglobin of 7.2. -Labs from 11/28/2023 show hemoglobin of 9.5 (10.7), MCV 95.2.  WBC 4.6 with normal differential. -She received IV Feraheme  last on 07/16/2023 and 08/06/2023.  -Iron  labs from 11/28/23 show saturation of 20 (25%)%, TIBC 367 with a ferritin of 30 (141). -Patient needs 2 additional doses of IV Feraheme  given over the next few weeks. -She continues oral iron  325 mg daily with mild constipation. - She is currently receiving 20,000 units Retacrit  monthly. -Proceed with 20,000 units Retacrit  today for hemoglobin of 9.5. - Has been receiving B12 injections since December 2024.  B12 level from today was 450.  Continue monthly B12 injections. -Continue monthly 20,000 units Retacrit .  Hold for hemoglobin 11 or greater. -Return to clinic monthly for labs plus or minus Retacrit  and B12 shots.  Return to clinic in approximately 12 weeks to see provider.  2.  Duodenal endocrine tumor: - Chromogranin (06/06/23): 592.2 (847).  - 24-hour urine 5-HIAA (11/21/2022): Normal - Dotatate PET scan from 12/06/2022: No evidence of neuroendocrine tumor or metastasis. -Spoke with Dr. Cheree Cords who recommends dotatate PET scan next month and if negative we will repeat only if clinically indicated or patient is having recurrent symptoms. -Check chromogranin A and 24-hour urine every 6 months-they were not drawn today but  will collect next month when she returns for her Retacrit  and B12.   3.  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 -Previous workup showed elevated kappa lambda light  chain with a normal ratio.  Immunofixation shows IgG kappa monoclonal protein. -No crab criteria: Calcium  10.0, creatinine 1.82, hemoglobin 9.5 and no new bone pain. -Most recent MGUS labs are from December 2024 which were essentially unremarkable. -Recheck MGUS labs every 6 months.  Will have them drawn next month when she returns for Retacrit  and B12 injections.  4.  B12 deficiency: -B12 level has improved to 450 (363) since starting B12 injections. -Continue B12 injections monthly.  Proceed with B12 today.   PLAN SUMMARY: >> Continue monthly Retacrit  20,000 and B12 injections and labs. Hold Retacrit  for hemoglobin 11 or higher. >> At her next Lab, Retacrit  and B12 injection will add on chromogranin A and 24-hour urine as well as MGUS labs since they were not drawn at today's visit. <<Recommend annual dotatate PET scan.  If negative we will repeat if clinically indicated. >> Recommend 2 additional doses of IV Feraheme  510 mg over the next couple of weeks. >> Continue monthly B12 injections. >> Recommend follow-up with lab work in 12 weeks, Retacrit /B12 and see a provider.     Orders Placed This Encounter  Procedures   NM PET DOTATATE SKULL BASE TO MID THIGH    Standing Status:   Future    Expected Date:   12/29/2023    Expiration Date:   11/27/2024    If indicated for the ordered procedure, I authorize the administration of a radiopharmaceutical per Radiology protocol:   Yes    Preferred imaging location?:   [facility]   CBC with Differential    Standing Status:   Future    Expected Date:   02/28/2024    Expiration Date:   11/27/2024   Comprehensive metabolic panel    Standing Status:   Future    Expected Date:   02/28/2024    Expiration Date:   11/27/2024   Ferritin    Standing Status:   Future    Expected Date:   02/28/2024    Expiration Date:   11/27/2024   Iron  and TIBC ([facility] ONLY)    Standing Status:   Future    Expected Date:   02/28/2024    Expiration  Date:   11/27/2024   Vitamin B12    Standing Status:   Future    Expected Date:   02/28/2024    Expiration Date:   11/27/2024   Folate    Standing Status:   Future    Expected Date:   11/29/2023    Expiration Date:   11/27/2024   Methylmalonic acid, serum    Standing Status:   Future    Expected Date:   02/28/2024    Expiration Date:   11/27/2024   Immunofixation electrophoresis    Standing Status:   Future    Expected Date:   12/29/2023    Expiration Date:   11/27/2024   Protein electrophoresis, serum    Standing Status:   Future    Expected Date:   12/29/2023    Expiration Date:   11/27/2024   Kappa/lambda light chains    Standing Status:   Future    Expected Date:   12/29/2023    Expiration Date:   11/27/2024   Chromogranin A    Standing Status:   Future  Expected Date:   12/29/2023    Expiration Date:   11/27/2024   5 HIAA, quantitative, urine, 24 hour    Standing Status:   Future    Expected Date:   12/29/2023    Expiration Date:   11/27/2024   I spent 25 minutes dedicated to the care of this patient (face-to-face and non-face-to-face) on the date of the encounter to include what is described in the assessment and plan.  Aurther Blue, NP   5/29/20252:11 PM  CHIEF COMPLAINT:   Diagnosis: history of NET and anemia    Cancer Staging  No matching staging information was found for the patient.    Prior Therapy: none  Current Therapy:   retacrit  injections, monthly B12 injections   HISTORY OF PRESENT ILLNESS:   Oncology History   No history exists.     INTERVAL HISTORY:   [name] [name] is a 83 y.o. female presenting to clinic today for follow up of history of NET and anemia. She was last seen in clinic on 06/06/2023.  She received 2 doses of Feraheme  on 07/16/2023 and 08/06/2023.  She has been receiving Retacrit  20,000 units every 2 weeks since 06/06/2023 when Retacrit  was increased from 10,000 units to 20,000 units due to declining hemoglobin.  She is now receiving Retacrit   20,000 units monthly with fairly stable hemoglobin between 9.2 and 10.9.  She is also receiving B12 injections.  Last injection was given on 10/24/23.   Today, she reports feeling well.  Has chronic stable fatigue that is worse with activity.  Appetite is 100% energy levels are 75%.  Denies any bright red blood per rectum, hematochezia, melena, gingival bleeding or epistaxis.  She continues to have chronic right knee pain and receives steroid injections which have helped.  She uses a cane for ambulation.  She continues to tolerate iron  supplements.  Has occasional constipation and will intermittently take a stool softener.  Has occasional chest pain secondary to atrial fibrillation.  She was recently taken off Xarelto  and placed on aspirin  81 mg due to worsening anemia.   She denies any hospitalizations, surgeries or changes to her baseline health.  PAST MEDICAL HISTORY:   Past Medical History: Past Medical History:  Diagnosis Date   A-fib (HCC)    Blood transfusion without reported diagnosis    CAD (coronary artery disease)    Multivessel status post CABG October 2005   Carotid artery disease (HCC)    CKD (chronic kidney disease), stage III (HCC)    Essential hypertension    Glaucoma    Laser surgery 2010   Hyperlipidemia    Hypothyroidism    Iron  deficiency anemia    Obesity     Surgical History: Past Surgical History:  Procedure Laterality Date   BIOPSY  04/27/2021   Procedure: BIOPSY;  Surgeon: Janel Medford, MD;  Location: [facility];  Service: Endoscopy;;   COLONOSCOPY N/A 02/04/2013   Procedure: COLONOSCOPY;  Surgeon: Ruby Corporal, MD;  Location: [facility];  Service: Endoscopy;  Laterality: N/A;  1015   COLONOSCOPY N/A 12/21/2016   Procedure: COLONOSCOPY;  Surgeon: Ruby Corporal, MD;  [facility]: [facility];  Service: Endoscopy;  Laterality: N/A;   COLONOSCOPY WITH PROPOFOL  N/A 03/22/2021   Procedure: COLONOSCOPY WITH PROPOFOL ;  Surgeon: Ruby Corporal, MD;   [facility]: [facility];  Service: Endoscopy;  Laterality: N/A;  8:15   CORONARY ARTERY BYPASS GRAFT  October 2005   Dr. Alva Jewels - LIMA to LAD, SVG to OM  2 and OM 3, SVG to PDA   ESOPHAGOGASTRODUODENOSCOPY N/A 12/21/2016   Procedure: ESOPHAGOGASTRODUODENOSCOPY (EGD);  Surgeon: Ruby Corporal, MD;  [facility]: [facility];  Service: Endoscopy;  Laterality: N/A;  11:10   ESOPHAGOGASTRODUODENOSCOPY N/A 02/27/2018   Procedure: ESOPHAGOGASTRODUODENOSCOPY (EGD);  Surgeon: Ruby Corporal, MD;  [facility];  Service: Endoscopy;  Laterality: N/A;   ESOPHAGOGASTRODUODENOSCOPY (EGD) WITH PROPOFOL  N/A 04/27/2021   Procedure: ESOPHAGOGASTRODUODENOSCOPY (EGD) WITH PROPOFOL ;  Surgeon: Janel Medford, MD;  Location: [facility];  Service: Endoscopy;  Laterality: N/A;   ESOPHAGOGASTRODUODENOSCOPY (EGD) WITH PROPOFOL  N/A 08/17/2021   Procedure: ESOPHAGOGASTRODUODENOSCOPY (EGD) WITH PROPOFOL ;  Surgeon: Brice Campi Albino Alu., MD;  Location: [facility] ENDOSCOPY;  Service: Gastroenterology;  Laterality: N/A;   EUS N/A 04/27/2021   Procedure: UPPER ENDOSCOPIC ULTRASOUND (EUS) RADIAL;  Surgeon: Janel Medford, MD;  Location: [facility];  Service: Endoscopy;  Laterality: N/A;   EYE SURGERY  2010   laser treatment to both eyes for glaucoma   GIVENS CAPSULE STUDY N/A 03/07/2018   Procedure: GIVENS CAPSULE STUDY;  Surgeon: Ruby Corporal, MD;  [facility];  Service: Endoscopy;  Laterality: N/A;   GIVENS CAPSULE STUDY N/A 02/05/2020   Procedure: GIVENS CAPSULE STUDY;  Surgeon: Ruby Corporal, MD;  [facility];  Service: Endoscopy;  Laterality: N/A;  730   POLYPECTOMY  03/22/2021   Procedure: POLYPECTOMY;  Surgeon: Ruby Corporal, MD;  [facility];  Service: Endoscopy;;   UPPER ESOPHAGEAL ENDOSCOPIC ULTRASOUND (EUS) N/A 08/17/2021   Procedure: UPPER ESOPHAGEAL ENDOSCOPIC ULTRASOUND (EUS);  Surgeon: Normie Becton., MD;  Location: [facility] ENDOSCOPY;  Service:  Gastroenterology;  Laterality: N/A;    Social History: Social History   Socioeconomic History   Marital status: Married    Spouse name: Not on file   Number of children: Not on file   Years of education: Not on file   Highest education level: Not on file  Occupational History   Occupation: retired   Tobacco Use   Smoking status: Former    Current packs/day: 0.00    Average packs/day: 0.3 packs/day for 40.0 years (10.0 ttl pk-yrs)    Types: Cigarettes    Start date: 07/03/1963    Quit date: 07/03/2003    Years since quitting: 20.4    Passive exposure: Never   Smokeless tobacco: Never  Vaping Use   Vaping status: Never Used  Substance and Sexual Activity   Alcohol use: No    Alcohol/week: 0.0 standard drinks of alcohol   Drug use: No   Sexual activity: Never  Other Topics Concern   Not on file  Social History Narrative   Not on file   Social Drivers of Health   Financial Resource Strain: Low Risk  (12/19/2022)   Overall Financial Resource Strain (CARDIA)    Difficulty of Paying Living Expenses: Not hard at all  Food Insecurity: No Food Insecurity (12/19/2022)   Hunger Vital Sign    Worried About Running Out of Food in the Last Year: Never true    Ran Out of Food in the Last Year: Never true  Transportation Needs: No Transportation Needs (12/19/2022)   PRAPARE - Administrator, Civil Service (Medical): No    Lack of Transportation (Non-Medical): No  Physical Activity: Insufficiently Active (12/19/2022)   Exercise Vital Sign    Days of Exercise per Week: 5 days    Minutes of Exercise per Session: 10 min  Stress: No Stress Concern  Present (12/19/2022)   [employer] of Occupational Health - Occupational Stress Questionnaire    Feeling of Stress : Not at all  Social Connections: Socially Integrated (12/19/2022)   Social Connection and Isolation Panel [NHANES]    Frequency of Communication with Friends and Family: More than three times a week    Frequency of  Social Gatherings with Friends and Family: More than three times a week    Attends Religious Services: More than 4 times per year    Active Member of Clubs or Organizations: Yes    Attends Banker Meetings: More than 4 times per year    Marital Status: Married  Catering manager Violence: Not At Risk (12/19/2022)   Humiliation, Afraid, Rape, and Kick questionnaire    Fear of Current or Ex-Partner: No    Emotionally Abused: No    Physically Abused: No    Sexually Abused: No    Family History: Family History  Problem Relation Age of Onset   Aneurysm Mother    Hypertension Mother    Coronary artery disease Mother    Coronary artery disease Father    Diabetes Sister    Coronary artery disease Sister    Diverticulosis Sister    COPD Sister    Kidney disease Sister    Diabetes Sister    Hypertension Sister    Breast cancer Sister 28   Hearing loss Sister    Heart Problems Sister        pacemaker   Colon cancer Brother 64       lived 18 months afterwards   Liver cancer Brother 41   Diabetes Son    Pancreatic cancer Son    Esophageal cancer Neg Hx    Inflammatory bowel disease Neg Hx    Stomach cancer Neg Hx     Current Medications:  Current Outpatient Medications:    amLODipine  (NORVASC ) 5 MG tablet, TAKE 1 TABLET BY MOUTH TWICE  DAILY, Disp: 200 tablet, Rfl: 2   aspirin  EC 81 MG tablet, Take 1 tablet (81 mg total) by mouth daily. Swallow whole., Disp: 90 tablet, Rfl: 4   cetirizine  (ALLERGY RELIEF CETIRIZINE ) 10 MG tablet, TAKE 1 TABLET BY MOUTH DAILY, Disp: 100 tablet, Rfl: 0   chlorthalidone  (HYGROTON ) 25 MG tablet, Take 25 mg by mouth daily., Disp: , Rfl:    clonazePAM  (KLONOPIN ) 0.5 MG tablet, Take half to one tablet by mouth at bedtime  as needed for excess anxiety due to grief, Disp: 10 tablet, Rfl: 0   famotidine  (PEPCID ) 20 MG tablet, TAKE 1 TABLET BY MOUTH TWICE  DAILY, Disp: 200 tablet, Rfl: 2   ferrous sulfate  325 (65 FE) MG tablet, Take 325 mg by  mouth daily with breakfast., Disp: , Rfl:    fish oil-omega-3 fatty acids 1000 MG capsule, Take 1 g by mouth daily., Disp: , Rfl:    fluticasone  (FLONASE ) 50 MCG/ACT nasal spray, USE 2 SPRAYS IN EACH NOSTRIL ONCE DAILY. SHAKE GENTLY BEFORE USING., Disp: 48 g, Rfl: 1   hydrALAZINE  (APRESOLINE ) 100 MG tablet, TAKE 1 TABLET BY MOUTH ONCE DAILY, Disp: 270 tablet, Rfl: 0   latanoprost  (XALATAN ) 0.005 % ophthalmic solution, Place 1 drop into both eyes at bedtime., Disp: , Rfl:    levothyroxine  (SYNTHROID ) 100 MCG tablet, TAKE 1 TABLET BY MOUTH ONCE DAILY, Disp: 90 tablet, Rfl: 3   metoprolol  succinate (TOPROL -XL) 50 MG 24 hr tablet, TAKE 1 TABLET BY MOUTH with OR immdiately following a meal, Disp: 30 tablet, Rfl:  0   nitroGLYCERIN  (NITROSTAT ) 0.4 MG SL tablet, DISSOLVE 1 TABLET UNDER THE  TONGUE EVERY 5 MINUTES AS NEEDED FOR CHEST PAIN. MAX OF 3 TABLETS IN 15 MINUTES. CALL 911 IF PAIN  PERSISTS., Disp: 100 tablet, Rfl: 2   Polyvinyl Alcohol-Povidone (REFRESH OP), Place 1 drop into both eyes at bedtime., Disp: , Rfl:    rosuvastatin  (CRESTOR ) 10 MG tablet, TAKE 1 TABLET BY MOUTH DAILY, Disp: 100 tablet, Rfl: 2   SYSTANE ULTRA 0.4-0.3 % SOLN, Place 1 drop into both eyes daily as needed (dry eyes)., Disp: , Rfl:    telmisartan  (MICARDIS ) 20 MG tablet, TAKE ONE-HALF TABLET BY MOUTH  DAILY (DISCARD UNUSED 1/2  TABLET), Disp: 80 tablet, Rfl: 3   trolamine salicylate (ASPERCREME) 10 % cream, Apply 1 application topically as needed for muscle pain., Disp: , Rfl:    UNABLE TO FIND, Shower chair x 1  DX M54.36, Disp: 1 each, Rfl: 0   UNABLE TO FIND, Tdap x 1 fax vaccine record to [phone], Disp: 1 each, Rfl: 0   Allergies: Allergies  Allergen Reactions   Tramadol  Itching   Lisinopril Cough   Shrimp (Diagnostic)     Gout flares   Clonidine Derivatives Other (See Comments)    Zombie BP dropped    REVIEW OF SYSTEMS:   Review of Systems  Constitutional:  Positive for fatigue.  Cardiovascular:   Positive for chest pain.  Gastrointestinal:  Positive for constipation.     VITALS:   Blood pressure 124/71, pulse (!) 52, temperature 97.8 F (36.6 C), temperature source Tympanic, resp. rate 17, height 5' (1.524 m), weight 136 lb (61.7 kg), SpO2 99%.  Wt Readings from Last 3 Encounters:  11/28/23 136 lb (61.7 kg)  08/29/23 145 lb 8.1 oz (66 kg)  07/31/23 146 lb 3.2 oz (66.3 kg)    Body mass index is 26.56 kg/m.  Performance status (ECOG): 1 - Symptomatic but completely ambulatory  PHYSICAL EXAM:   Physical Exam Constitutional:      Appearance: Normal appearance. She is obese.     Comments: Sitting in wheelchair  Cardiovascular:     Rate and Rhythm: Normal rate and regular rhythm.  Pulmonary:     Effort: Pulmonary effort is normal.     Breath sounds: Normal breath sounds.  Abdominal:     General: Bowel sounds are normal.     Palpations: Abdomen is soft.  Musculoskeletal:        General: No swelling. Normal range of motion.  Neurological:     Mental Status: She is alert and oriented to person, place, and time. Mental status is at baseline.     LABS:      Latest Ref Rng & Units 11/28/2023    9:05 AM 10/24/2023    8:58 AM 09/26/2023    9:09 AM  CBC  WBC 4.0 - 10.5 K/uL 4.6  4.8  4.2   Hemoglobin 12.0 - 15.0 g/dL 9.5  78.4  9.9   Hematocrit 36.0 - 46.0 % 29.8  33.7  32.0   Platelets 150 - 400 K/uL 174  162  146       Latest Ref Rng & Units 11/28/2023    9:05 AM 08/29/2023    8:01 AM 08/29/2023    7:46 AM  CMP  Glucose 70 - 99 mg/dL 696  295  284   BUN 8 - 23 mg/dL 44  36  35   Creatinine 0.44 - 1.00 mg/dL 1.32  4.40  1.02  Sodium 135 - 145 mmol/L 137  136  135   Potassium 3.5 - 5.1 mmol/L 4.3  4.1  4.2   Chloride 98 - 111 mmol/L 109  106  106   CO2 22 - 32 mmol/L 18  20  20    Calcium  8.9 - 10.3 mg/dL 16.1  09.6  04.5   Total Protein 6.5 - 8.1 g/dL 7.7   7.5   Total Bilirubin 0.0 - 1.2 mg/dL 0.3   0.5   Alkaline Phos 38 - 126 U/L 87   88   AST 15 - 41 U/L  22   25   ALT 0 - 44 U/L 16   18      No results found for: "CEA1", "CEA" / No results found for: "CEA1", "CEA" No results found for: "PSA1" No results found for: "CAN199" No results found for: "CAN125"  Lab Results  Component Value Date   TOTALPROTELP 7.4 06/06/2023   TOTALPROTELP 7.0 06/06/2023   ALBUMINELP 3.9 06/06/2023   A1GS 0.3 06/06/2023   A2GS 0.7 06/06/2023   BETS 0.9 06/06/2023   GAMS 1.7 06/06/2023   MSPIKE Not Observed 06/06/2023   SPEI Comment 06/06/2023   Lab Results  Component Value Date   TIBC 367 11/28/2023   TIBC 315 08/29/2023   TIBC 342 06/06/2023   FERRITIN 30 11/28/2023   FERRITIN 141 08/29/2023   FERRITIN 41 06/06/2023   IRONPCTSAT 20 11/28/2023   IRONPCTSAT 25 08/29/2023   IRONPCTSAT 17 06/06/2023   Lab Results  Component Value Date   LDH 152 11/16/2022     STUDIES:   No results found.

## 2023-11-28 NOTE — Progress Notes (Signed)
 Patient's Hgb 9.5 and blood pressure stable. Patient tolerated  B12 and Retacrit  injection with no complaints voiced.  Site clean and dry with no bruising or swelling noted at site.  See MAR for details.  Band aid applied.  Patient stable during and after injection.  Vss with discharge and left in satisfactory condition with no s/s of distress noted. All follow ups as scheduled.   [name] [name]

## 2023-11-29 ENCOUNTER — Ambulatory Visit: Admitting: Family Medicine

## 2023-12-04 ENCOUNTER — Inpatient Hospital Stay: Attending: Hematology

## 2023-12-04 VITALS — BP 111/56 | HR 62 | Temp 97.2°F | Resp 18

## 2023-12-04 DIAGNOSIS — C7A010 Malignant carcinoid tumor of the duodenum: Secondary | ICD-10-CM | POA: Insufficient documentation

## 2023-12-04 DIAGNOSIS — N1832 Chronic kidney disease, stage 3b: Secondary | ICD-10-CM | POA: Diagnosis not present

## 2023-12-04 DIAGNOSIS — D509 Iron deficiency anemia, unspecified: Secondary | ICD-10-CM | POA: Diagnosis not present

## 2023-12-04 DIAGNOSIS — D500 Iron deficiency anemia secondary to blood loss (chronic): Secondary | ICD-10-CM

## 2023-12-04 DIAGNOSIS — I129 Hypertensive chronic kidney disease with stage 1 through stage 4 chronic kidney disease, or unspecified chronic kidney disease: Secondary | ICD-10-CM | POA: Diagnosis not present

## 2023-12-04 DIAGNOSIS — Z79899 Other long term (current) drug therapy: Secondary | ICD-10-CM | POA: Insufficient documentation

## 2023-12-04 DIAGNOSIS — D631 Anemia in chronic kidney disease: Secondary | ICD-10-CM | POA: Insufficient documentation

## 2023-12-04 DIAGNOSIS — E538 Deficiency of other specified B group vitamins: Secondary | ICD-10-CM | POA: Insufficient documentation

## 2023-12-04 MED ORDER — SODIUM CHLORIDE 0.9 % IV SOLN: Freq: Once | INTRAVENOUS | Status: AC

## 2023-12-04 MED ORDER — SODIUM CHLORIDE 0.9 % IV SOLN
510.0000 mg | Freq: Once | INTRAVENOUS | Status: AC
  Administered 2023-12-04: 510 mg via INTRAVENOUS
  Filled 2023-12-04: qty 510

## 2023-12-04 NOTE — Patient Instructions (Signed)

## 2023-12-04 NOTE — Progress Notes (Signed)
 Patient presents today fro Feraheme  infusion per providers order.  Vital signs WNL.  Patient has no new complaints at this time.  Peripheral IV started and blood return noted pre and post infusion.  Stable during infusion without adverse affects.  Vital signs stable.  No complaints at this time.  Discharge from clinic ambulatory in stable condition.  Alert and oriented X 3.  Follow [facility] as scheduled.

## 2023-12-11 ENCOUNTER — Inpatient Hospital Stay

## 2023-12-11 VITALS — BP 130/68 | HR 65 | Temp 97.5°F | Resp 18

## 2023-12-11 DIAGNOSIS — D631 Anemia in chronic kidney disease: Secondary | ICD-10-CM

## 2023-12-11 DIAGNOSIS — D500 Iron deficiency anemia secondary to blood loss (chronic): Secondary | ICD-10-CM

## 2023-12-11 DIAGNOSIS — N1832 Chronic kidney disease, stage 3b: Secondary | ICD-10-CM | POA: Diagnosis not present

## 2023-12-11 DIAGNOSIS — E538 Deficiency of other specified B group vitamins: Secondary | ICD-10-CM | POA: Diagnosis not present

## 2023-12-11 DIAGNOSIS — C7A010 Malignant carcinoid tumor of the duodenum: Secondary | ICD-10-CM | POA: Diagnosis not present

## 2023-12-11 DIAGNOSIS — Z79899 Other long term (current) drug therapy: Secondary | ICD-10-CM | POA: Diagnosis not present

## 2023-12-11 DIAGNOSIS — I129 Hypertensive chronic kidney disease with stage 1 through stage 4 chronic kidney disease, or unspecified chronic kidney disease: Secondary | ICD-10-CM | POA: Diagnosis not present

## 2023-12-11 DIAGNOSIS — D509 Iron deficiency anemia, unspecified: Secondary | ICD-10-CM | POA: Diagnosis not present

## 2023-12-11 MED ORDER — SODIUM CHLORIDE 0.9 % IV SOLN: Freq: Once | INTRAVENOUS | Status: AC

## 2023-12-11 MED ORDER — SODIUM CHLORIDE 0.9 % IV SOLN
510.0000 mg | Freq: Once | INTRAVENOUS | Status: AC
  Administered 2023-12-11: 510 mg via INTRAVENOUS
  Filled 2023-12-11: qty 510

## 2023-12-11 NOTE — Progress Notes (Signed)
 She took 2 tylenol  at 1230 today prior to appt.  Patient tolerated iron  infusion with no complaints voiced.  Peripheral IV site clean and dry with good blood return noted before and after infusion.  Band aid applied.  Pt observed for 30 minutes post iron  infusion without any complications. VSS with discharge and left in satisfactory condition with no s/s of distress noted. All follow ups as scheduled.   Bertel Venard [employer]

## 2023-12-16 ENCOUNTER — Ambulatory Visit (INDEPENDENT_AMBULATORY_CARE_PROVIDER_SITE_OTHER): Admitting: Gastroenterology

## 2023-12-19 ENCOUNTER — Other Ambulatory Visit (HOSPITAL_COMMUNITY): Payer: Self-pay | Admitting: Family Medicine

## 2023-12-19 ENCOUNTER — Encounter: Payer: Self-pay | Admitting: Family Medicine

## 2023-12-19 ENCOUNTER — Ambulatory Visit (INDEPENDENT_AMBULATORY_CARE_PROVIDER_SITE_OTHER): Admitting: Family Medicine

## 2023-12-19 VITALS — BP 118/63 | HR 70 | Resp 16 | Ht 60.0 in | Wt 141.0 lb

## 2023-12-19 DIAGNOSIS — K219 Gastro-esophageal reflux disease without esophagitis: Secondary | ICD-10-CM | POA: Diagnosis not present

## 2023-12-19 DIAGNOSIS — I10 Essential (primary) hypertension: Secondary | ICD-10-CM

## 2023-12-19 DIAGNOSIS — D3A010 Benign carcinoid tumor of the duodenum: Secondary | ICD-10-CM

## 2023-12-19 DIAGNOSIS — I2510 Atherosclerotic heart disease of native coronary artery without angina pectoris: Secondary | ICD-10-CM

## 2023-12-19 DIAGNOSIS — K5790 Diverticulosis of intestine, part unspecified, without perforation or abscess without bleeding: Secondary | ICD-10-CM

## 2023-12-19 DIAGNOSIS — Z1231 Encounter for screening mammogram for malignant neoplasm of breast: Secondary | ICD-10-CM

## 2023-12-19 DIAGNOSIS — E038 Other specified hypothyroidism: Secondary | ICD-10-CM

## 2023-12-19 DIAGNOSIS — N1832 Chronic kidney disease, stage 3b: Secondary | ICD-10-CM

## 2023-12-19 DIAGNOSIS — D631 Anemia in chronic kidney disease: Secondary | ICD-10-CM

## 2023-12-19 DIAGNOSIS — I6523 Occlusion and stenosis of bilateral carotid arteries: Secondary | ICD-10-CM

## 2023-12-19 DIAGNOSIS — E785 Hyperlipidemia, unspecified: Secondary | ICD-10-CM

## 2023-12-19 NOTE — Assessment & Plan Note (Signed)
 Has annual PET , in 2024 no evidence of metastatic or current disease

## 2023-12-19 NOTE — Assessment & Plan Note (Signed)
 Annual imaging , stable has had US  in 2025

## 2023-12-19 NOTE — Assessment & Plan Note (Signed)
 Hyperlipidemia:Low fat diet discussed and encouraged.   Lipid Panel  Lab Results  Component Value Date   CHOL 112 03/21/2023   HDL 53 03/21/2023   LDLCALC 56 03/21/2023   TRIG 13 03/21/2023   CHOLHDL 2.1 03/21/2023     Rept lab today, has eaten however

## 2023-12-19 NOTE — Assessment & Plan Note (Signed)
 Updated lab needed.

## 2023-12-19 NOTE — Assessment & Plan Note (Signed)
 Receiving monthly parenteral treatments

## 2023-12-19 NOTE — Patient Instructions (Addendum)
 F/U in early October, call if you need me sooner  Labs today, lipid, cmp and eGFr, TsH and frree T4 , we will call you with the result  Please schedule mammogram at checkout , due in August  Please call your Cardiologist's office for your follow up appointment  It is important that you exercise regularly at least 30 minutes 5 times a week. If you develop chest pain, have severe difficulty breathing, or feel very tired, stop exercising immediately and seek medical attention   No medication changes at this time  You are doing well, keep up hte good work!  Thanks for choosing [facility], we consider it a privelige to serve you.

## 2023-12-19 NOTE — Assessment & Plan Note (Signed)
Denies rectal bleeding 

## 2023-12-19 NOTE — Assessment & Plan Note (Addendum)
 Stable denies chest pain or increased exertional fatigue, needs to schedul cardiology follow up

## 2023-12-19 NOTE — Progress Notes (Signed)
 [name] [name]     MRN: [id number]      DOB: May 18, 1941  Chief Complaint  Patient presents with   Medical Management of Chronic Issues    Follow up     HPI [name] [name] is here for follow up and re-evaluation of chronic medical conditions, medication management and review of any available recent lab and radiology data.  Preventive health is updated, specifically  Cancer screening and Immunization.   Questions or concerns regarding consultations or procedures which the PT has had in the interim are  addressed. The PT denies any adverse reactions to current medications since the last visit.  There are no new concerns.  There are no specific complaints   ROS Denies recent fever or chills. Denies sinus pressure, nasal congestion, ear pain or sore throat. Denies chest congestion, productive cough or wheezing. Denies chest pains, palpitations and leg swelling Denies abdominal pain, nausea, vomiting,diarrhea or constipation.   Denies dysuria, frequency, hesitancy or incontinence. Denies uncontrolled joint pain, swelling and limitation in mobility. Denies headaches, seizures, numbness, or tingling. Denies depression, anxiety or insomnia. Denies skin break down or rash.   PE  BP 118/63   Pulse 70   Resp 16   Ht 5' (1.524 m)   Wt 141 lb 0.3 oz (64 kg)   SpO2 93%   BMI 27.54 kg/m   Patient alert and oriented and in no cardiopulmonary distress.  HEENT: No facial asymmetry, EOMI,     Neck decreased ROM .  Chest: Clear to auscultation bilaterally.  CVS: S1, S2 no murmurs, no S3.Regular rate.  ABD: Soft non tender.   Ext: No edema  MS: Adequate ROM spine, shoulders, hips and knees.  Skin: Intact, no ulcerations or rash noted.  Psych: Good eye contact, normal affect. Memory intact not anxious or depressed appearing.  CNS: CN 2-12 intact, power,  normal throughout.no focal deficits noted.   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   Lipid Panel  Lab Results  Component Value Date   CHOL 112 03/21/2023   HDL 53 03/21/2023   LDLCALC 56 03/21/2023   TRIG 13 03/21/2023   CHOLHDL 2.1 03/21/2023     Rept lab today, has eaten however  Hypothyroidism Updated lab needed   Essential hypertension, malignant Controlled, no change in medication DASH diet and commitment to daily physical activity for a minimum of 30 minutes discussed and encouraged, as a part of hypertension management. The importance of attaining a healthy weight is also discussed.     12/19/2023    2:07 PM 12/11/2023    2:59 PM 12/11/2023    1:34 PM 12/04/2023    2:57 PM 12/04/2023    2:17 PM 11/28/2023   10:32 AM 10/24/2023   10:41 AM  BP/Weight  Systolic BP 118 130 108 111 131 124 138  Diastolic BP 63 68 68 56 58 71 71  Wt. (Lbs) 141.02     136   BMI 27.54 kg/m2     26.56 kg/m2        Diverticulosis Denies rectal bleeding  GERD (gastroesophageal reflux disease) Controlled, no change in medication

## 2023-12-19 NOTE — Assessment & Plan Note (Signed)
 Controlled, no change in medication

## 2023-12-19 NOTE — Assessment & Plan Note (Signed)
 Controlled, no change in medication DASH diet and commitment to daily physical activity for a minimum of 30 minutes discussed and encouraged, as a part of hypertension management. The importance of attaining a healthy weight is also discussed.     12/19/2023    2:07 PM 12/11/2023    2:59 PM 12/11/2023    1:34 PM 12/04/2023    2:57 PM 12/04/2023    2:17 PM 11/28/2023   10:32 AM 10/24/2023   10:41 AM  BP/Weight  Systolic BP 118 130 108 111 131 124 138  Diastolic BP 63 68 68 56 58 71 71  Wt. (Lbs) 141.02     136   BMI 27.54 kg/m2     26.56 kg/m2

## 2023-12-20 ENCOUNTER — Other Ambulatory Visit: Payer: Self-pay

## 2023-12-20 ENCOUNTER — Ambulatory Visit: Payer: Self-pay | Admitting: Family Medicine

## 2023-12-20 DIAGNOSIS — E875 Hyperkalemia: Secondary | ICD-10-CM

## 2023-12-20 LAB — CMP14+EGFR
ALT: 16 IU/L (ref 0–32)
AST: 21 IU/L (ref 0–40)
Albumin: 4.4 g/dL (ref 3.7–4.7)
Alkaline Phosphatase: 111 IU/L (ref 44–121)
BUN/Creatinine Ratio: 21 (ref 12–28)
BUN: 31 mg/dL — ABNORMAL HIGH (ref 8–27)
Bilirubin Total: 0.3 mg/dL (ref 0.0–1.2)
CO2: 18 mmol/L — ABNORMAL LOW (ref 20–29)
Calcium: 11 mg/dL — ABNORMAL HIGH (ref 8.7–10.3)
Chloride: 110 mmol/L — ABNORMAL HIGH (ref 96–106)
Creatinine, Ser: 1.46 mg/dL — ABNORMAL HIGH (ref 0.57–1.00)
Globulin, Total: 2.8 g/dL (ref 1.5–4.5)
Glucose: 101 mg/dL — ABNORMAL HIGH (ref 70–99)
Potassium: 5.8 mmol/L — ABNORMAL HIGH (ref 3.5–5.2)
Sodium: 141 mmol/L (ref 134–144)
Total Protein: 7.2 g/dL (ref 6.0–8.5)
eGFR: 36 mL/min/{1.73_m2} — ABNORMAL LOW (ref >59)

## 2023-12-20 LAB — LIPID PANEL
Chol/HDL Ratio: 1.9 ratio (ref 0.0–4.4)
Cholesterol, Total: 109 mg/dL (ref 100–199)
HDL: 57 mg/dL (ref >39)
LDL Chol Calc (NIH): 36 mg/dL (ref 0–99)
Triglycerides: 83 mg/dL (ref 0–149)
VLDL Cholesterol Cal: 16 mg/dL (ref 5–40)

## 2023-12-20 LAB — TSH+FREE T4
Free T4: 1.76 ng/dL (ref 0.82–1.77)
TSH: 1.6 u[IU]/mL (ref 0.450–4.500)

## 2023-12-20 MED ORDER — SODIUM POLYSTYRENE SULFONATE 15 GM/60ML CO SUSP: 15.0000 g | Freq: Once | 0 refills | Status: AC

## 2023-12-23 ENCOUNTER — Other Ambulatory Visit: Payer: Self-pay

## 2023-12-23 ENCOUNTER — Telehealth: Payer: Self-pay

## 2023-12-23 DIAGNOSIS — E875 Hyperkalemia: Secondary | ICD-10-CM

## 2023-12-23 NOTE — Telephone Encounter (Signed)
 Labs ordered for [facility] lab appt on 06/27

## 2023-12-23 NOTE — Telephone Encounter (Signed)
 Copied from CRM [id number]. Topic: Clinical - Request for Lab/Test Order >> Dec 23, 2023  8:42 AM Avram MATSU wrote: Reason for CRM: patient is wondering if she needs labs and would like an order to be put in. She mentioned MD Antonetta told her to get a potassium checked. She want to order to go to [facility] lab for June 27th appt   ----------------------------------------------------------------------- From previous Reason for Contact - Other: Reason for CRM: patient is wondering if she needs labs and would like an order to be put in. She mentioned MD Antonetta told her to get a potassium checked.

## 2023-12-24 ENCOUNTER — Ambulatory Visit (INDEPENDENT_AMBULATORY_CARE_PROVIDER_SITE_OTHER): Payer: Medicare Other

## 2023-12-24 VITALS — Ht 60.0 in | Wt 141.0 lb

## 2023-12-24 DIAGNOSIS — Z0000 Encounter for general adult medical examination without abnormal findings: Secondary | ICD-10-CM

## 2023-12-24 DIAGNOSIS — Z780 Asymptomatic menopausal state: Secondary | ICD-10-CM

## 2023-12-24 DIAGNOSIS — Z7409 Other reduced mobility: Secondary | ICD-10-CM

## 2023-12-24 DIAGNOSIS — Z5982 Transportation insecurity: Secondary | ICD-10-CM

## 2023-12-24 NOTE — Patient Instructions (Signed)
 [name] [name] ,  Thank you for taking time out of your busy schedule to complete your Annual Wellness Visit with me. I enjoyed our conversation and look forward to speaking with you again next year. I, as well as your care team,  appreciate your ongoing commitment to your health goals. Please review the following plan we discussed and let me know if I can assist you in the future.  I enjoyed our conversation and look forward to it again next year. Blessing for the upcoming year!!  -Elene Downum  Your Game plan/ To Do List    Referrals Placed:  Osteoporosis Screening: Please call the number below to schedule your appt. [facility] Phone: [phone]  A referral has been placed for you to check and see what additional resources are available to you.   If you haven't heard from anyone within the next 7 business days, please call them and let them know a referral has been placed for you Phone: [phone]  Follow up Visits:  Next appointment with PCP: April 30, 2024  Medicare AWV with health advisor: December 28, 2024 at 8:00 am    Clinician Recommendations:  Aim for 30 minutes of exercise or brisk walking, 6-8 glasses of water , and 5 servings of fruits and vegetables each day.       This is a list of the screening recommended for you and due dates:  Health Maintenance  Topic Date Due   DTaP/Tdap/Td vaccine (2 - Tdap) 09/24/2017   DEXA scan (bone density measurement)  12/20/2021   COVID-19 Vaccine (7 - 2024-25 season) 03/03/2023   Mammogram  01/28/2024   Flu Shot  01/31/2024   Medicare Annual Wellness Visit  12/23/2024   Colon Cancer Screening  03/22/2026   Pneumococcal Vaccine for age over 13  Completed   Zoster (Shingles) Vaccine  Completed   Hepatitis B Vaccine  Aged Out   HPV Vaccine  Aged Out   Meningitis B Vaccine  Aged Out   Hepatitis C Screening  Discontinued    Advanced directives: (Provided) Advance directive discussed with you today. I have provided a  copy for you to complete at home and have notarized. Once this is complete, please bring a copy in to our office so we can scan it into your chart.  Advance Care Planning is important because it:  [x]  Makes sure you receive the medical care that is consistent with your values, goals, and preferences  [x]  It provides guidance to your family and loved ones and reduces their decisional burden about whether or not they are making the right decisions based on your wishes.  Follow the link provided in your after visit summary or read over the paperwork we have mailed to you to help you started getting your Advance Directives in place. If you need assistance in completing these, please reach out to us  so that we can help you!  Understanding Your Risk for Falls Millions of people have serious injuries from falls each year. It is important to understand your risk of falling. Talk with your health care provider about your risk and what you can do to lower it. If you do have a serious fall, make sure to tell your provider. Falling once raises your risk of falling again. How can falls affect me? Serious injuries from falls are common. These include: Broken bones, such as hip fractures. Head injuries, such as traumatic brain injuries (TBI) or concussions. A fear of falling can cause  you to avoid activities and stay at home. This can make your muscles weaker and raise your risk for a fall. What can increase my risk? There are a number of risk factors that increase your risk for falling. The more risk factors you have, the higher your risk of falling. Serious injuries from a fall happen most often to people who are older than 82 years old. Teenagers and young adults ages 4-29 are also at higher risk. Common risk factors include: Weakness in the lower body. Being generally weak or confused due to long-term (chronic) illness. Dizziness or balance problems. Poor vision. Medicines that cause dizziness or  drowsiness. These may include: Medicines for your blood pressure, heart, anxiety, insomnia, or swelling (edema). Pain medicines. Muscle relaxants. Other risk factors include: Drinking alcohol. Having had a fall in the past. Having foot pain or wearing improper footwear. Working at a dangerous job. Having any of the following in your home: Tripping hazards, such as floor clutter or loose rugs. Poor lighting. Pets. Having dementia or memory loss. What actions can I take to lower my risk of falling?     Physical activity Stay physically fit. Do strength and balance exercises. Consider taking a regular class to build strength and balance. Yoga and tai chi are good options. Vision Have your eyes checked every year and your prescription for glasses or contacts updated as needed. Shoes and walking aids Wear non-skid shoes. Wear shoes that have rubber soles and low heels. Do not wear high heels. Do not walk around the house in socks or slippers. Use a cane or walker as told by your provider. Home safety Attach secure railings on both sides of your stairs. Install grab bars for your bathtub, shower, and toilet. Use a non-skid mat in your bathtub or shower. Attach bath mats securely with double-sided, non-slip rug tape. Use good lighting in all rooms. Keep a flashlight near your bed. Make sure there is a clear path from your bed to the bathroom. Use night-lights. Do not use throw rugs. Make sure all carpeting is taped or tacked down securely. Remove all clutter from walkways and stairways, including extension cords. Repair uneven or broken steps and floors. Avoid walking on icy or slippery surfaces. Walk on the grass instead of on icy or slick sidewalks. Use ice melter to get rid of ice on walkways in the winter. Use a cordless phone. Questions to ask your health care provider Can you help me check my risk for a fall? Do any of my medicines make me more likely to fall? Should I take a  vitamin D  supplement? What exercises can I do to improve my strength and balance? Should I make an appointment to have my vision checked? Do I need a bone density test to check for weak bones (osteoporosis)? Would it help to use a cane or a walker? Where to find more information Centers for Disease Control and Prevention, [employer]: TonerPromos.no Community-Based Fall Prevention Programs: TonerPromos.no [employer] on Aging: BaseRingTones.pl Contact a health care provider if: You fall at home. You are afraid of falling at home. You feel weak, drowsy, or dizzy. This information is not intended to replace advice given to you by your health care provider. Make sure you discuss any questions you have with your health care provider. Document Revised: 02/19/2022 Document Reviewed: 02/19/2022 Elsevier Patient Education  2024 [employer].

## 2023-12-24 NOTE — Progress Notes (Signed)
 Subjective:   [name] [name] is a 83 y.o. who presents for a Medicare Wellness preventive visit.  As a reminder, Annual Wellness Visits don't include a physical exam, and some assessments may be limited, especially if this visit is performed virtually. We may recommend an in-person follow-up visit with your provider if needed.  Visit Complete: Virtual I connected with  [name] [name] on 12/24/23 by a audio enabled telemedicine application and verified that I am speaking with the correct person using two identifiers.  Patient Location: Home  Provider Location: Home Office  I discussed the limitations of evaluation and management by telemedicine. The patient expressed understanding and agreed to proceed.  Vital Signs: Because this visit was a virtual/telehealth visit, some criteria may be missing or patient reported. Any vitals not documented were not able to be obtained and vitals that have been documented are patient reported.  VideoDeclined- This patient declined Librarian, academic. Therefore the visit was completed with audio only.  Persons Participating in Visit: Patient.  AWV Questionnaire: No: Patient Medicare AWV questionnaire was not completed prior to this visit.  Cardiac Risk Factors include: advanced age (>75men, >69 women);dyslipidemia;hypertension;Other (see comment), Risk factor comments: AFib, CKD stage 3     Objective:    Today's Vitals   12/24/23 0857 12/24/23 0858  Weight: 141 lb (64 kg)   Height: 5' (1.524 m)   PainSc:  0-No pain   Body mass index is 27.54 kg/m.     12/24/2023    8:53 AM 12/04/2023    2:26 PM 11/28/2023   10:36 AM 10/24/2023   11:21 AM 09/26/2023   11:14 AM 08/29/2023    8:34 AM 08/06/2023    3:59 PM  Advanced Directives  Does Patient Have a Medical Advance Directive? No Yes Yes No No No No  Type of Advance Directive  Living will Living will      Does patient want to make changes to medical advance directive?    No - Patient declined      Would patient like information on creating a medical advance directive? Yes (MAU/Ambulatory/Procedural Areas - Information given) No - Patient declined No - Patient declined No - Patient declined No - Patient declined No - Patient declined No - Patient declined    Current Medications (verified) Outpatient Encounter Medications as of 12/24/2023  Medication Sig   amLODipine  (NORVASC ) 5 MG tablet TAKE 1 TABLET BY MOUTH TWICE  DAILY   aspirin  EC 81 MG tablet Take 1 tablet (81 mg total) by mouth daily. Swallow whole.   cetirizine  (ALLERGY RELIEF CETIRIZINE ) 10 MG tablet TAKE 1 TABLET BY MOUTH DAILY   chlorthalidone  (HYGROTON ) 25 MG tablet Take 25 mg by mouth daily.   famotidine  (PEPCID ) 20 MG tablet TAKE 1 TABLET BY MOUTH TWICE  DAILY   ferrous sulfate  325 (65 FE) MG tablet Take 325 mg by mouth daily with breakfast.   fish oil-omega-3 fatty acids 1000 MG capsule Take 1 g by mouth daily.   fluticasone  (FLONASE ) 50 MCG/ACT nasal spray USE 2 SPRAYS IN EACH NOSTRIL ONCE DAILY. SHAKE GENTLY BEFORE USING.   hydrALAZINE  (APRESOLINE ) 100 MG tablet TAKE 1 TABLET BY MOUTH ONCE DAILY   latanoprost  (XALATAN ) 0.005 % ophthalmic solution Place 1 drop into both eyes at bedtime.   levothyroxine  (SYNTHROID ) 100 MCG tablet TAKE 1 TABLET BY MOUTH ONCE DAILY   metoprolol  succinate (TOPROL -XL) 50 MG 24 hr tablet TAKE 1 TABLET BY MOUTH with OR immdiately following a meal  nitroGLYCERIN  (NITROSTAT ) 0.4 MG SL tablet DISSOLVE 1 TABLET UNDER THE  TONGUE EVERY 5 MINUTES AS NEEDED FOR CHEST PAIN. MAX OF 3 TABLETS IN 15 MINUTES. CALL 911 IF PAIN  PERSISTS.   Polyvinyl Alcohol-Povidone (REFRESH OP) Place 1 drop into both eyes at bedtime.   rosuvastatin  (CRESTOR ) 10 MG tablet TAKE 1 TABLET BY MOUTH DAILY   SYSTANE ULTRA 0.4-0.3 % SOLN Place 1 drop into both eyes daily as needed (dry eyes).   telmisartan  (MICARDIS ) 20 MG tablet TAKE ONE-HALF TABLET BY MOUTH  DAILY (DISCARD UNUSED 1/2  TABLET)    trolamine salicylate (ASPERCREME) 10 % cream Apply 1 application topically as needed for muscle pain.   UNABLE TO FIND Shower chair x 1  DX M54.36   UNABLE TO FIND Tdap x 1 fax vaccine record to [phone]   No facility-administered encounter medications on file as of 12/24/2023.    Allergies (verified) Tramadol , Lisinopril, Shrimp (diagnostic), and Clonidine derivatives   History: Past Medical History:  Diagnosis Date   A-fib (HCC)    Blood transfusion without reported diagnosis    CAD (coronary artery disease)    Multivessel status post CABG October 2005   Carotid artery disease (HCC)    CKD (chronic kidney disease), stage III (HCC)    Essential hypertension    Glaucoma    Laser surgery 2010   Hyperlipidemia    Hypothyroidism    Iron  deficiency anemia    Obesity    Past Surgical History:  Procedure Laterality Date   BIOPSY  04/27/2021   Procedure: BIOPSY;  Surgeon: Teressa Toribio SQUIBB, MD;  Location: [facility];  Service: Endoscopy;;   COLONOSCOPY N/A 02/04/2013   Procedure: COLONOSCOPY;  Surgeon: Claudis RAYMOND Rivet, MD;  Location: [facility];  Service: Endoscopy;  Laterality: N/A;  1015   COLONOSCOPY N/A 12/21/2016   Procedure: COLONOSCOPY;  Surgeon: Rivet Claudis RAYMOND, MD;  Location: [facility];  Service: Endoscopy;  Laterality: N/A;   COLONOSCOPY WITH PROPOFOL  N/A 03/22/2021   Procedure: COLONOSCOPY WITH PROPOFOL ;  Surgeon: Rivet Claudis RAYMOND, MD;  Location: [facility];  Service: Endoscopy;  Laterality: N/A;  8:15   CORONARY ARTERY BYPASS GRAFT  October 2005   Dr. Dusty - LIMA to LAD, SVG to OM 2 and OM 3, SVG to PDA   ESOPHAGOGASTRODUODENOSCOPY N/A 12/21/2016   Procedure: ESOPHAGOGASTRODUODENOSCOPY (EGD);  Surgeon: Rivet Claudis RAYMOND, MD;  [facility]: [facility];  Service: Endoscopy;  Laterality: N/A;  11:10   ESOPHAGOGASTRODUODENOSCOPY N/A 02/27/2018   Procedure: ESOPHAGOGASTRODUODENOSCOPY (EGD);  Surgeon: Rivet Claudis RAYMOND, MD;  [facility];  Service:  Endoscopy;  Laterality: N/A;   ESOPHAGOGASTRODUODENOSCOPY (EGD) WITH PROPOFOL  N/A 04/27/2021   Procedure: ESOPHAGOGASTRODUODENOSCOPY (EGD) WITH PROPOFOL ;  Surgeon: Teressa Toribio SQUIBB, MD;  Location: [facility];  Service: Endoscopy;  Laterality: N/A;   ESOPHAGOGASTRODUODENOSCOPY (EGD) WITH PROPOFOL  N/A 08/17/2021   Procedure: ESOPHAGOGASTRODUODENOSCOPY (EGD) WITH PROPOFOL ;  Surgeon: Wilhelmenia Aloha Raddle., MD;  Location: [facility] ENDOSCOPY;  Service: Gastroenterology;  Laterality: N/A;   EUS N/A 04/27/2021   Procedure: UPPER ENDOSCOPIC ULTRASOUND (EUS) RADIAL;  Surgeon: Teressa Toribio SQUIBB, MD;  Location: [facility];  Service: Endoscopy;  Laterality: N/A;   EYE SURGERY  2010   laser treatment to both eyes for glaucoma   GIVENS CAPSULE STUDY N/A 03/07/2018   Procedure: GIVENS CAPSULE STUDY;  Surgeon: Rivet Claudis RAYMOND, MD;  [facility];  Service: Endoscopy;  Laterality: N/A;   GIVENS CAPSULE STUDY N/A 02/05/2020   Procedure: GIVENS CAPSULE STUDY;  Surgeon: Golda Claudis PENNER, MD;  [facility];  Service: Endoscopy;  Laterality: N/A;  730   POLYPECTOMY  03/22/2021   Procedure: POLYPECTOMY;  Surgeon: Golda Claudis PENNER, MD;  [facility];  Service: Endoscopy;;   UPPER ESOPHAGEAL ENDOSCOPIC ULTRASOUND (EUS) N/A 08/17/2021   Procedure: UPPER ESOPHAGEAL ENDOSCOPIC ULTRASOUND (EUS);  Surgeon: Wilhelmenia Aloha Raddle., MD;  Location: [facility] ENDOSCOPY;  Service: Gastroenterology;  Laterality: N/A;   Family History  Problem Relation Age of Onset   Aneurysm Mother    Hypertension Mother    Coronary artery disease Mother    Coronary artery disease Father    Diabetes Sister    Coronary artery disease Sister    Diverticulosis Sister    COPD Sister    Kidney disease Sister    Diabetes Sister    Hypertension Sister    Breast cancer Sister 44   Hearing loss Sister    Heart Problems Sister        pacemaker   Colon cancer Brother 64       lived 18 months afterwards   Liver cancer Brother  4   Diabetes Son    Pancreatic cancer Son    Esophageal cancer Neg Hx    Inflammatory bowel disease Neg Hx    Stomach cancer Neg Hx    Social History   Socioeconomic History   Marital status: Married    Spouse name: Not on file   Number of children: Not on file   Years of education: Not on file   Highest education level: Not on file  Occupational History   Occupation: retired   Tobacco Use   Smoking status: Former    Current packs/day: 0.00    Average packs/day: 0.3 packs/day for 40.0 years (10.0 ttl pk-yrs)    Types: Cigarettes    Start date: 07/03/1963    Quit date: 07/03/2003    Years since quitting: 20.4    Passive exposure: Never   Smokeless tobacco: Never  Vaping Use   Vaping status: Never Used  Substance and Sexual Activity   Alcohol use: No    Alcohol/week: 0.0 standard drinks of alcohol   Drug use: No   Sexual activity: Never  Other Topics Concern   Not on file  Social History Narrative   Not on file   Social Drivers of Health   Financial Resource Strain: Low Risk  (12/24/2023)   Overall Financial Resource Strain (CARDIA)    Difficulty of Paying Living Expenses: Not hard at all  Food Insecurity: No Food Insecurity (12/24/2023)   Hunger Vital Sign    Worried About Running Out of Food in the Last Year: Never true    Ran Out of Food in the Last Year: Never true  Transportation Needs: No Transportation Needs (12/24/2023)   PRAPARE - Administrator, Civil Service (Medical): No    Lack of Transportation (Non-Medical): No  Physical Activity: Insufficiently Active (12/24/2023)   Exercise Vital Sign    Days of Exercise per Week: 7 days    Minutes of Exercise per Session: 20 min  Stress: No Stress Concern Present (12/24/2023)   [employer] of Occupational Health - Occupational Stress Questionnaire    Feeling of Stress: Not at all  Social Connections: Socially Integrated (12/24/2023)   Social Connection and Isolation Panel    Frequency of  Communication with Friends and Family: More than three times a week    Frequency of Social Gatherings with Friends and Family: More  than three times a week    Attends Religious Services: More than 4 times per year    Active Member of Clubs or Organizations: Yes    Attends Engineer, structural: More than 4 times per year    Marital Status: Married    Tobacco Counseling Counseling given: Yes    Clinical Intake:  Pre-visit preparation completed: Yes  Pain : No/denies pain Pain Score: 0-No pain     BMI - recorded: 27.54 Nutritional Status: BMI 25 -29 Overweight Nutritional Risks: None Diabetes: No  Lab Results  Component Value Date   HGBA1C 4.6 (L) 01/15/2023   HGBA1C 5.5 02/25/2018   HGBA1C 5.7 (H) 08/16/2016     How often do you need to have someone help you when you read instructions, pamphlets, or other written materials from your doctor or pharmacy?: 1 - Never  Interpreter Needed?: No  Information entered by :: Stefano ORN CMA   Activities of Daily Living     12/24/2023    9:09 AM  In your present state of health, do you have any difficulty performing the following activities:  Hearing? 0  Vision? 0  Difficulty concentrating or making decisions? 0  Walking or climbing stairs? 0  Dressing or bathing? 0  Doing errands, shopping? 0  Preparing Food and eating ? N  Using the Toilet? N  In the past six months, have you accidently leaked urine? N  Do you have problems with loss of bowel control? N  Managing your Medications? N  Managing your Finances? N  Housekeeping or managing your Housekeeping? N    Patient Care Team: Antonetta Rollene BRAVO, MD as PCP - General (Family Medicine) Octavia Bruckner, MD as Consulting Physician (Optometry) Roddie Bring, DPM as Consulting Physician (Podiatry) Miriam Norris, NP as Nurse Practitioner (Cardiology) Rogers Hai, MD as Medical Oncologist (Hematology and Oncology) Debera Jayson MATSU, MD as Consulting  Physician (Cardiology)  I have updated your Care Teams any recent Medical Services you may have received from other providers in the past year.     Assessment:   This is a routine wellness examination for [name] [name].  Hearing/Vision screen Hearing Screening - Comments:: Patient denies any hearing difficulties.   Vision Screening - Comments:: Patient wears reading glasses only. Up to date with yearly exams.  Will be seeing Dr. Bruckner Octavia since Dr. Lamar Octavia has retired. Is scheduled for an appt in September.    Goals Addressed             This Visit's Progress    Patient Stated   On track    Stay in good health.     Patient Stated   On track    Patients goal is to be as well as possible     Patient Stated       I want to get my closet cleaned out.        Depression Screen     12/24/2023    9:10 AM 12/19/2023    2:09 PM 05/21/2023    1:13 PM 01/15/2023    3:18 PM 12/19/2022    9:13 AM 10/02/2022    1:12 PM 05/15/2022    1:10 PM  PHQ 2/9 Scores  PHQ - 2 Score 0 0 0 0 0 0 0  PHQ- 9 Score 0     1     Fall Risk     12/24/2023    9:04 AM 12/19/2023    2:09 PM 05/21/2023    1:13  PM 01/15/2023    3:18 PM 12/19/2022    9:15 AM  Fall Risk   Falls in the past year? 0 0 0 0 0  Number falls in past yr: 0 0 0 0 0  Injury with Fall? 0 0 0 0 0  Risk for fall due to : No Fall Risks  No Fall Risks No Fall Risks History of fall(s);Impaired balance/gait;Orthopedic patient  Follow up Falls evaluation completed;Education provided;Falls prevention discussed Falls evaluation completed Falls evaluation completed Falls evaluation completed Education provided;Falls prevention discussed    MEDICARE RISK AT HOME:  Medicare Risk at Home Any stairs in or around the home?: Yes If so, are there any without handrails?: No Home free of loose throw rugs in walkways, pet beds, electrical cords, etc?: Yes Adequate lighting in your home to reduce risk of falls?: Yes Life alert?: No Use of  a cane, walker or w/c?: Yes Elevated toilet seat or a handicapped toilet?: No  TIMED UP AND GO:  Was the test performed?  No  Cognitive Function: 6CIT completed    12/11/2021    1:25 PM  MMSE - Mini Mental State Exam  Not completed: Unable to complete        12/24/2023    9:09 AM 12/19/2022    9:17 AM 12/11/2021    1:26 PM 11/16/2019   10:13 AM 11/11/2018    8:45 AM  6CIT Screen  What Year? 0 points 0 points 0 points 0 points 0 points  What month? 0 points 0 points 0 points 0 points 0 points  What time? 0 points 0 points 0 points 0 points 0 points  Count back from 20 0 points 0 points 0 points 0 points 0 points  Months in reverse 0 points 0 points 0 points 0 points 0 points  Repeat phrase 0 points 0 points 0 points 0 points 4 points  Total Score 0 points 0 points 0 points 0 points 4 points    Immunizations Immunization History  Administered Date(s) Administered   Fluad Quad(high Dose 65+) 03/31/2019, 04/28/2020, 02/23/2021, 04/13/2022   Fluad Trivalent(High Dose 65+) 05/21/2023   Influenza Split 03/11/2012   Influenza,inj,Quad PF,6+ Mos 04/02/2013, 05/17/2014, 06/29/2015, 06/27/2016, 02/25/2017, 03/04/2018   PFIZER(Purple Top)SARS-COV-2 Vaccination 08/07/2019, 08/28/2019, 04/21/2020, 11/16/2020, 05/17/2021   Pfizer Covid-19 Vaccine Bivalent Booster 58yrs & up 04/18/2022   Pfizer(Comirnaty)Fall Seasonal Vaccine 12 years and older 04/24/2022   Pneumococcal Conjugate-13 01/04/2014   Pneumococcal Polysaccharide-23 09/25/2007   Rsv, Bivalent, Protein Subunit Rsvpref,pf (Abrysvo) 04/03/2022   Td 09/25/2007   Zoster Recombinant(Shingrix) 10/12/2021, 03/15/2022   Zoster, Live 03/12/2012    Screening Tests Health Maintenance  Topic Date Due   DTaP/Tdap/Td (2 - Tdap) 09/24/2017   DEXA SCAN  12/20/2021   COVID-19 Vaccine (7 - 2024-25 season) 03/03/2023   MAMMOGRAM  01/28/2024   INFLUENZA VACCINE  01/31/2024   Medicare Annual Wellness (AWV)  12/23/2024   Colonoscopy   03/22/2026   Pneumococcal Vaccine: 50+ Years  Completed   Zoster Vaccines- Shingrix  Completed   Hepatitis B Vaccines  Aged Out   HPV VACCINES  Aged Out   Meningococcal B Vaccine  Aged Out   Hepatitis C Screening  Discontinued    Health Maintenance  Health Maintenance Due  Topic Date Due   DTaP/Tdap/Td (2 - Tdap) 09/24/2017   DEXA SCAN  12/20/2021   COVID-19 Vaccine (7 - 2024-25 season) 03/03/2023   Health Maintenance Items Addressed: DEXA ordered CRR referral placed to assist patient with obtaining transportation  at no cost to her and assistance with ADL's.  Additional Screening:  Vision Screening: Recommended annual ophthalmology exams for early detection of glaucoma and other disorders of the eye. Would you like a referral to an eye doctor? No    Dental Screening: Recommended annual dental exams for proper oral hygiene  Community Resource Referral / Chronic Care Management: CRR required this visit?  Yes   CCM required this visit?  No   Plan:    I have personally reviewed and noted the following in the patient's chart:   Medical and social history Use of alcohol, tobacco or illicit drugs  Current medications and supplements including opioid prescriptions. Patient is not currently taking opioid prescriptions. Functional ability and status Nutritional status Physical activity Advanced directives List of other physicians Hospitalizations, surgeries, and ER visits in previous 12 months Vitals Screenings to include cognitive, depression, and falls Referrals and appointments  In addition, I have reviewed and discussed with patient certain preventive protocols, quality metrics, and best practice recommendations. A written personalized care plan for preventive services as well as general preventive health recommendations were provided to patient.   [name] [name], CMA   12/24/2023   After Visit Summary: (Mail) Due to this being a telephonic visit, the after visit  summary with patients personalized plan was offered to patient via mail   Notes: Nothing significant to report at this time.

## 2023-12-25 ENCOUNTER — Telehealth: Payer: Self-pay

## 2023-12-25 NOTE — Progress Notes (Signed)
 Complex Care Management Note  Care Guide Note 12/25/2023 Name: [name] MRN: [id number] DOB: February 25, 1941  [name] [name] is a 83 y.o. year old female who sees Antonetta Rollene BRAVO, MD for primary care. I reached out to [employer] by phone today to offer complex care management services.  Ms. Zeiders was given information about Complex Care Management services today including:   The Complex Care Management services include support from the care team which includes your Nurse Care Manager, Clinical Social Worker, or Pharmacist.  The Complex Care Management team is here to help remove barriers to the health concerns and goals most important to you. Complex Care Management services are voluntary, and the patient may decline or stop services at any time by request to their care team member.   Complex Care Management Consent Status: Patient agreed to services and verbal consent obtained.   Follow up plan:  Telephone appointment with complex care management team member scheduled for:  BSW 12/26/2023 and RNCM 01/15/2024  Encounter Outcome:  Patient Scheduled  Jeoffrey Buffalo , RMA     [place]  [facility], [facility] Guide  Direct Dial: [phone]  Website: [email or web address]

## 2023-12-26 ENCOUNTER — Telehealth

## 2023-12-27 ENCOUNTER — Ambulatory Visit: Payer: Self-pay | Admitting: Family Medicine

## 2023-12-27 ENCOUNTER — Inpatient Hospital Stay

## 2023-12-27 ENCOUNTER — Other Ambulatory Visit (HOSPITAL_COMMUNITY)
Admission: RE | Admit: 2023-12-27 | Discharge: 2023-12-27 | Disposition: A | Discharge status: Home / Self Care | Source: Ambulatory Visit | Attending: Family Medicine | Admitting: Family Medicine

## 2023-12-27 ENCOUNTER — Ambulatory Visit: Payer: Self-pay | Admitting: Oncology

## 2023-12-27 VITALS — BP 106/55 | HR 76 | Temp 97.8°F | Resp 17

## 2023-12-27 DIAGNOSIS — N1832 Chronic kidney disease, stage 3b: Secondary | ICD-10-CM | POA: Diagnosis not present

## 2023-12-27 DIAGNOSIS — D500 Iron deficiency anemia secondary to blood loss (chronic): Secondary | ICD-10-CM

## 2023-12-27 DIAGNOSIS — E538 Deficiency of other specified B group vitamins: Secondary | ICD-10-CM | POA: Diagnosis not present

## 2023-12-27 DIAGNOSIS — E875 Hyperkalemia: Secondary | ICD-10-CM | POA: Insufficient documentation

## 2023-12-27 DIAGNOSIS — C7A010 Malignant carcinoid tumor of the duodenum: Secondary | ICD-10-CM | POA: Diagnosis not present

## 2023-12-27 DIAGNOSIS — Z79899 Other long term (current) drug therapy: Secondary | ICD-10-CM | POA: Diagnosis not present

## 2023-12-27 DIAGNOSIS — D649 Anemia, unspecified: Secondary | ICD-10-CM

## 2023-12-27 DIAGNOSIS — D631 Anemia in chronic kidney disease: Secondary | ICD-10-CM | POA: Diagnosis not present

## 2023-12-27 DIAGNOSIS — D509 Iron deficiency anemia, unspecified: Secondary | ICD-10-CM | POA: Diagnosis not present

## 2023-12-27 DIAGNOSIS — I129 Hypertensive chronic kidney disease with stage 1 through stage 4 chronic kidney disease, or unspecified chronic kidney disease: Secondary | ICD-10-CM | POA: Diagnosis not present

## 2023-12-27 LAB — BASIC METABOLIC PANEL WITH GFR
Anion gap: 9 (ref 5–15)
BUN: 33 mg/dL — ABNORMAL HIGH (ref 8–23)
CO2: 21 mmol/L — ABNORMAL LOW (ref 22–32)
Calcium: 9.7 mg/dL (ref 8.9–10.3)
Chloride: 110 mmol/L (ref 98–111)
Creatinine, Ser: 1.69 mg/dL — ABNORMAL HIGH (ref 0.44–1.00)
GFR, Estimated: 30 mL/min — ABNORMAL LOW (ref >60)
Glucose, Bld: 102 mg/dL — ABNORMAL HIGH (ref 70–99)
Potassium: 4.9 mmol/L (ref 3.5–5.1)
Sodium: 140 mmol/L (ref 135–145)

## 2023-12-27 LAB — CBC WITH DIFFERENTIAL/PLATELET
Abs Immature Granulocytes: 0.01 10*3/uL (ref 0.00–0.07)
Basophils Absolute: 0 10*3/uL (ref 0.0–0.1)
Basophils Relative: 1 %
Eosinophils Absolute: 0.3 10*3/uL (ref 0.0–0.5)
Eosinophils Relative: 6 %
HCT: 27.1 % — ABNORMAL LOW (ref 36.0–46.0)
Hemoglobin: 8.6 g/dL — ABNORMAL LOW (ref 12.0–15.0)
Immature Granulocytes: 0 %
Lymphocytes Relative: 18 %
Lymphs Abs: 0.8 10*3/uL (ref 0.7–4.0)
MCH: 31.4 pg (ref 26.0–34.0)
MCHC: 31.7 g/dL (ref 30.0–36.0)
MCV: 98.9 fL (ref 80.0–100.0)
Monocytes Absolute: 0.4 10*3/uL (ref 0.1–1.0)
Monocytes Relative: 8 %
Neutro Abs: 3.1 10*3/uL (ref 1.7–7.7)
Neutrophils Relative %: 67 %
Platelets: 157 10*3/uL (ref 150–400)
RBC: 2.74 MIL/uL — ABNORMAL LOW (ref 3.87–5.11)
RDW: 15.9 % — ABNORMAL HIGH (ref 11.5–15.5)
WBC: 4.6 10*3/uL (ref 4.0–10.5)
nRBC: 0 % (ref 0.0–0.2)

## 2023-12-27 LAB — SAMPLE TO BLOOD BANK

## 2023-12-27 MED ORDER — EPOETIN ALFA-EPBX 20000 UNIT/ML IJ SOLN
20000.0000 [IU] | Freq: Once | INTRAMUSCULAR | Status: AC
  Administered 2023-12-27: 20000 [IU] via SUBCUTANEOUS
  Filled 2023-12-27: qty 1

## 2023-12-27 MED ORDER — CYANOCOBALAMIN 1000 MCG/ML IJ SOLN
1000.0000 ug | Freq: Once | INTRAMUSCULAR | Status: AC
  Administered 2023-12-27: 1000 ug via INTRAMUSCULAR
  Filled 2023-12-27: qty 1

## 2023-12-27 NOTE — Progress Notes (Signed)
 Labs reviewed and blood pressure stable. Patient tolerated injection with no complaints voiced.  Site clean and dry with no bruising or swelling noted at site.  See MAR for details.  Band aid applied.  Patient stable during and after injection.  Vss with discharge and left in satisfactory condition with no s/s of distress noted. All follow ups as scheduled.   [name] [name]

## 2023-12-30 ENCOUNTER — Encounter (HOSPITAL_COMMUNITY)
Admission: RE | Admit: 2023-12-30 | Discharge: 2023-12-30 | Disposition: A | Discharge status: Home / Self Care | Source: Ambulatory Visit | Attending: Oncology | Admitting: Oncology

## 2023-12-30 DIAGNOSIS — D3A010 Benign carcinoid tumor of the duodenum: Secondary | ICD-10-CM | POA: Insufficient documentation

## 2023-12-30 DIAGNOSIS — C7A010 Malignant carcinoid tumor of the duodenum: Secondary | ICD-10-CM | POA: Diagnosis not present

## 2023-12-30 LAB — KAPPA/LAMBDA LIGHT CHAINS
Kappa free light chain: 64.5 mg/L — ABNORMAL HIGH (ref 3.3–19.4)
Kappa, lambda light chain ratio: 1.6 (ref 0.26–1.65)
Lambda free light chains: 40.4 mg/L — ABNORMAL HIGH (ref 5.7–26.3)

## 2023-12-30 LAB — IMMUNOFIXATION ELECTROPHORESIS
IgA: 150 mg/dL (ref 64–422)
IgG (Immunoglobin G), Serum: 1507 mg/dL (ref 586–1602)
IgM (Immunoglobulin M), Srm: 116 mg/dL (ref 26–217)
Total Protein ELP: 6.7 g/dL (ref 6.0–8.5)

## 2023-12-30 LAB — PROTEIN ELECTROPHORESIS, SERUM
A/G Ratio: 1.2 (ref 0.7–1.7)
Albumin ELP: 3.6 g/dL (ref 2.9–4.4)
Alpha-1-Globulin: 0.3 g/dL (ref 0.0–0.4)
Alpha-2-Globulin: 0.6 g/dL (ref 0.4–1.0)
Beta Globulin: 0.9 g/dL (ref 0.7–1.3)
Gamma Globulin: 1.3 g/dL (ref 0.4–1.8)
Globulin, Total: 3.1 g/dL (ref 2.2–3.9)
Total Protein ELP: 6.7 g/dL (ref 6.0–8.5)

## 2023-12-30 LAB — CHROMOGRANIN A: Chromogranin A (ng/mL): 557.9 ng/mL — ABNORMAL HIGH (ref 0.0–101.8)

## 2023-12-30 MED ORDER — COPPER CU 64 DOTATATE 1 MCI/ML IV SOLN
4.0000 | Freq: Once | INTRAVENOUS | Status: AC
  Administered 2023-12-30: 4.3 via INTRAVENOUS

## 2024-01-01 ENCOUNTER — Other Ambulatory Visit: Payer: Self-pay | Admitting: Family Medicine

## 2024-01-10 ENCOUNTER — Other Ambulatory Visit: Payer: Self-pay | Admitting: Family Medicine

## 2024-01-14 DIAGNOSIS — D631 Anemia in chronic kidney disease: Secondary | ICD-10-CM | POA: Diagnosis not present

## 2024-01-14 DIAGNOSIS — R809 Proteinuria, unspecified: Secondary | ICD-10-CM | POA: Diagnosis not present

## 2024-01-14 DIAGNOSIS — N189 Chronic kidney disease, unspecified: Secondary | ICD-10-CM | POA: Diagnosis not present

## 2024-01-14 DIAGNOSIS — E211 Secondary hyperparathyroidism, not elsewhere classified: Secondary | ICD-10-CM | POA: Diagnosis not present

## 2024-01-15 ENCOUNTER — Other Ambulatory Visit: Payer: Self-pay

## 2024-01-15 ENCOUNTER — Other Ambulatory Visit: Payer: Self-pay | Admitting: *Deleted

## 2024-01-15 NOTE — Patient Outreach (Signed)
 Complex Care Management   Visit Note  01/15/2024  Name:  [name] [name] MRN: [id number] DOB: 1940-10-03  Situation: Referral received for Complex Care Management related to SDOH Barriers:  Transportation I obtained verbal consent from Patient.  Visit completed with patient   on the phone  Background:   Past Medical History:  Diagnosis Date   A-fib [facility])    Blood transfusion without reported diagnosis    CAD (coronary artery disease)    Multivessel status post CABG October 2005   Carotid artery disease (HCC)    CKD (chronic kidney disease), stage III (HCC)    Essential hypertension    Glaucoma    Laser surgery 2010   Hyperlipidemia    Hypothyroidism    Iron  deficiency anemia    Obesity     Assessment: Patient Reported Symptoms:  Cognitive Cognitive Status: No symptoms reported Cognitive/Intellectual Conditions Management [RPT]: None reported or documented in medical history or problem list   Health Maintenance Behaviors: Annual physical exam Healing Pattern: Average Health Facilitated by: Rest, Prayer/meditation  Neurological Neurological Review of Symptoms: Numbness Neurological Management Strategies: Routine screening Neurological Self-Management Outcome: 4 (good) Neurological Comment: R thumb numb  HEENT HEENT Symptoms Reported: Eye dryness HEENT Management Strategies: Medication therapy HEENT Self-Management Outcome: 4 (good)    Cardiovascular Cardiovascular Symptoms Reported: No symptoms reported Does patient have uncontrolled Hypertension?: No Cardiovascular Management Strategies: Routine screening Cardiovascular Self-Management Outcome: 4 (good)  Respiratory Respiratory Symptoms Reported: No symptoms reported Respiratory Management Strategies: Routine screening Respiratory Self-Management Outcome: 4 (good)  Endocrine Endocrine Symptoms Reported: No symptoms reported Is patient diabetic?: No Endocrine Self-Management Outcome: 4 (good)  Gastrointestinal  Gastrointestinal Symptoms Reported: No symptoms reported Gastrointestinal Management Strategies: Medication therapy Gastrointestinal Self-Management Outcome: 4 (good) Gastrointestinal Comment: Iron  makes stool hard    Genitourinary Genitourinary Symptoms Reported: No symptoms reported Genitourinary Self-Management Outcome: 4 (good)  Integumentary Integumentary Symptoms Reported: No symptoms reported Skin Management Strategies: Routine screening Skin Self-Management Outcome: 4 (good)  Musculoskeletal Musculoskelatal Symptoms Reviewed: Limited mobility Musculoskeletal Management Strategies: Medical device (Cane) Musculoskeletal Self-Management Outcome: 4 (good) Falls in the past year?: No Number of falls in past year: 1 or less Was there an injury with Fall?: No Fall Risk Category Calculator: 0 Patient Fall Risk Level: Low Fall Risk Patient at Risk for Falls Due to: No Fall Risks Fall risk Follow up: Falls evaluation completed  Psychosocial Psychosocial Symptoms Reported: No symptoms reported Behavioral Health Self-Management Outcome: 4 (good) Major Change/Loss/Stressor/Fears (CP): Denies Techniques to Cope with Loss/Stress/Change: Not applicable Quality of Family Relationships: helpful, supportive Do you feel physically threatened by others?: No      01/15/2024   10:34 AM  Depression screen PHQ 2/9  Decreased Interest 0  Down, Depressed, Hopeless 0  PHQ - 2 Score 0    There were no vitals filed for this visit.  Medications Reviewed Today     Reviewed by Kay Hendricks MATSU, RN (Case Manager) on 01/15/24 at 1022  Med List Status: <None>   Medication Order Taking? Sig Documenting Provider Last Dose Status Informant  amLODipine  (NORVASC ) 5 MG tablet [id number] Yes TAKE 1 TABLET BY MOUTH TWICE  DAILY Antonetta Rollene BRAVO, MD  Active   aspirin  EC 81 MG tablet [id number] Yes Take 1 tablet (81 mg total) by mouth daily. Swallow whole. Miriam Maddyx Vallie, NP  Active   cetirizine  (ALLERGY  RELIEF CETIRIZINE ) 10 MG tablet [id number] Yes TAKE 1 TABLET BY MOUTH DAILY Antonetta Rollene BRAVO, MD  Active   chlorthalidone  (HYGROTON ) 25  MG tablet [id number] Yes Take 25 mg by mouth daily.  Patient taking differently: Take 12.5 mg by mouth every other day.   [provider]  Active            Med Note ANNELLA, ROLLENE BRAVO   Tue May 21, 2023  1:26 PM) Takes half every other day, pt states Nephrologist who  prescribes it wants it that wayETTER Clos) reported 05/2023  famotidine  (PEPCID ) 20 MG tablet [id number] Yes TAKE 1 TABLET BY MOUTH TWICE  DAILY Antonetta ROLLENE BRAVO, MD  Active   ferrous sulfate  325 (65 FE) MG tablet [id number] Yes Take 325 mg by mouth daily with breakfast. [provider]  Active Self, Pharmacy Records           Med Note MAIRE, [name] A   Tue Jul 04, 2021  1:33 PM)    fish oil-omega-3 fatty acids 1000 MG capsule [id number] Yes Take 1 g by mouth daily. [provider]  Active Self, Pharmacy Records  fluticasone  (FLONASE ) 50 MCG/ACT nasal spray [id number] Yes USE 2 SPRAYS IN EACH NOSTRIL ONCE DAILY. SHAKE GENTLY BEFORE USING. Antonetta ROLLENE BRAVO, MD  Active Self, Pharmacy Records           Med Note [facility] Jan 15, 2022 11:15 AM) As needed  hydrALAZINE  (APRESOLINE ) 100 MG tablet [id number] Yes TAKE 1 TABLET BY MOUTH ONCE  DAILY Antonetta ROLLENE BRAVO, MD  Active   latanoprost  (XALATAN ) 0.005 % ophthalmic solution [id number] Yes Place 1 drop into both eyes at bedtime. [provider]  Active Self, Pharmacy Records  levothyroxine  (SYNTHROID ) 100 MCG tablet [id number] Yes TAKE 1 TABLET BY MOUTH DAILY Antonetta ROLLENE BRAVO, MD  Active   metoprolol  succinate (TOPROL -XL) 50 MG 24 hr tablet [id number] Yes TAKE 1 TABLET BY MOUTH with OR immdiately following a meal Debera Jayson MATSU, MD  Active   nitroGLYCERIN  (NITROSTAT ) 0.4 MG SL tablet [id number] Yes DISSOLVE 1 TABLET UNDER THE  TONGUE EVERY 5 MINUTES AS NEEDED FOR CHEST PAIN. MAX OF 3 TABLETS IN 15  MINUTES. CALL 911 IF PAIN  PERSISTS. Debera Jayson MATSU, MD  Active   Polyvinyl Alcohol-Povidone [facility] OP) [id number] Yes Place 1 drop into both eyes at bedtime. [provider]  Active Self, Pharmacy Records  rosuvastatin  (CRESTOR ) 10 MG tablet [id number] Yes TAKE 1 TABLET BY MOUTH DAILY Antonetta ROLLENE BRAVO, MD  Active   SYSTANE ULTRA 0.4-0.3 % SOLN [id number] Yes Place 1 drop into both eyes daily as needed (dry eyes). [provider]  Active Self, Pharmacy Records  telmisartan  (MICARDIS ) 20 MG tablet [id number] Yes TAKE ONE-HALF TABLET BY MOUTH  DAILY (DISCARD UNUSED 1/2  TABLET) Antonetta ROLLENE BRAVO, MD  Active   trolamine salicylate (ASPERCREME) 10 % cream [id number] Yes Apply 1 application topically as needed for muscle pain. [provider]  Active Self, Pharmacy Records  UNABLE TO FIND [id number] Yes Shower chair x 1  DX M54.36 Antonetta ROLLENE BRAVO, MD  Active Self, Pharmacy Records  UNABLE TO FIND [id number] Yes Tdap x 1 fax vaccine record to [phone] Antonetta ROLLENE BRAVO, MD  Active Self, Pharmacy Records            Recommendation:   Continue Current Plan of Care  Follow Up Plan:   Telephone follow-up 2 weeks   Rosina Forte [facility] [place] VBCI-Population Health RN Case Manager   Direct [phone]

## 2024-01-15 NOTE — Patient Instructions (Signed)
 Visit Information  Thank you for taking time to visit with me today. Please don't hesitate to contact me if I can be of assistance to you before our next scheduled appointment.  Our next appointment is by telephone on 7/29 at 11:45 Please call the care guide team at [phone] if you need to cancel or reschedule your appointment.   Following is a copy of your care plan:   Goals Addressed             This Visit's Progress    VBCI RN Care Plan: PCS , Transportation       Problems:  Care Coordination needs related to Transportation and PCS Transportation barriers  Goal: Over the next 30  days the Patient will work with Child psychotherapist to Loss adjuster, chartered and PCS related to the management of Atrial Fibrillation as evidenced by review of electronic medical record and patient or social worker report      Interventions:   Discussed plans with patient for ongoing care management follow up and provided patient with direct contact information for care management team Social Work referral for transportation and [employer].  Discussed plans with patient for ongoing care management follow up and provided patient with direct contact information for care management team Screening for signs and symptoms of depression related to chronic disease state  Assessed social determinant of health barriers  Patient Self-Care Activities:  Work with the social worker to address care coordination needs and will continue to work with the clinical team to address health care and disease management related needs  Plan:  Telephone follow up appointment with care management team member scheduled for:  7/29 11:45 AM              Please call the Suicide and Crisis Lifeline: 988 call the [place]  National Suicide Prevention Lifeline: (305)377-6016 or TTY: 9023305502 TTY 252-619-0798) to talk to a trained counselor call 1-800-273-TALK (toll free, 24 hour hotline) call the [facility]:  [phone] call 911 if you are experiencing a Mental Health or Behavioral Health Crisis or need someone to talk to.  The patient verbalized understanding of instructions, educational materials, and care plan provided today and agreed to receive a mailed copy of patient instructions, educational materials, and care plan.   Rosina Forte BSN, RN  [place] VBCI-Population Health RN Case Manager   Direct [phone]

## 2024-01-17 ENCOUNTER — Other Ambulatory Visit: Payer: Self-pay

## 2024-01-17 NOTE — Patient Outreach (Signed)
 Complex Care Management   Visit Note  01/17/2024  Name:  [name] [name] MRN: [id number] DOB: Nov 15, 1940  Situation: Referral received for Complex Care Management related to SDOH Barriers:  Transportation Personal Care I obtained verbal consent from Patient.  Visit completed with patient  on the phone  Background:   Past Medical History:  Diagnosis Date   A-fib [facility])    Blood transfusion without reported diagnosis    CAD (coronary artery disease)    Multivessel status post CABG October 2005   Carotid artery disease (HCC)    CKD (chronic kidney disease), stage III (HCC)    Essential hypertension    Glaucoma    Laser surgery 2010   Hyperlipidemia    Hypothyroidism    Iron  deficiency anemia    Obesity     Assessment:  Patient reports her insurance does not cover personal care.  Patient is unable to pay private pay and needs help with housekeeping.  Patient has family and church but they do not assist.  Patient currently uses [employer] for transportation and does not feel safe with shared rides from [employer].  Patient reports she is not eligible for Medicaid.  Patient also receives [employer] for $40.  SW refers patient to DSS in December to apply for Energy Assistance program. No other resources provided as patient is already connect to transportation services.  SDOH Interventions    Flowsheet Row Patient Outreach Telephone from 01/17/2024 in [place] POPULATION HEALTH DEPARTMENT Patient Outreach Telephone from 01/15/2024 in [place] POPULATION HEALTH DEPARTMENT Clinical Support from 12/24/2023 in [facility] Primary Care Clinical Support from 12/19/2022 in [facility] Primary Care Clinical Support from 12/11/2021 in [facility] Primary Care Chronic Care Management from 04/25/2021 in [facility] Primary Care  SDOH Interventions        Food Insecurity Interventions Intervention Not Indicated Intervention Not Indicated Intervention Not  Indicated Intervention Not Indicated Intervention Not Indicated Intervention Not Indicated  Housing Interventions Intervention Not Indicated Intervention Not Indicated Intervention Not Indicated Intervention Not Indicated Intervention Not Indicated --  Transportation Interventions Other (Comment)  [[employer] transportation] [employer]  [[employer]] Intervention Not Indicated Intervention Not Indicated Intervention Not Indicated Intervention Not Indicated  Utilities Interventions Intervention Not Indicated Intervention Not Indicated Intervention Not Indicated Intervention Not Indicated -- --  Alcohol Usage Interventions -- -- Intervention Not Indicated (Score <7) Intervention Not Indicated (Score <7) -- --  Depression Interventions/Treatment  -- -- EYV7-0 Score <4 Follow-up Not Indicated -- -- --  Financial Strain Interventions Intervention Not Indicated -- Intervention Not Indicated Intervention Not Indicated Intervention Not Indicated --  Physical Activity Interventions -- -- Other (Comments)  [patient is active and she even walks to church] Intervention Not Indicated Patient Refused --  Stress Interventions -- -- Intervention Not Indicated Intervention Not Indicated Intervention Not Indicated --  Social Connections Interventions -- -- Intervention Not Indicated Intervention Not Indicated Intervention Not Indicated --  Tobacco Interventions -- -- -- -- Patient Refused --  Health Literacy Interventions -- -- Intervention Not Indicated -- -- --      Recommendation:   None  Follow Up Plan:   Patient has met all care management goals. Care Management case will be closed. Patient has been provided contact information should new needs arise.   Tillman Gardener, BSW [place]  [facility], [facility] Social Worker Direct Dial: [phone]  Fax: [phone] Website: [email or web address]

## 2024-01-17 NOTE — Patient Instructions (Signed)
 Visit Information  Thank you for taking time to visit with me today. Please don't hesitate to contact me if I can be of assistance to you before our next scheduled appointment.    Following is a copy of your care plan:   Goals Addressed             This Visit's Progress    BSW VBCI Social Work Care Plan       Problems:   Transportation and Personal Care Services  CSW Clinical Goal(s):   Over the next 0 years the Patient will continue to use [employer] for transportation.  Interventions:  Social Determinants of Health in Patient with Atrial Fibrillation and CKD Stage 3: SDOH assessments completed: Transportation and Personal Care Evaluation of current treatment plan related to unmet needs Patient currently uses [employer] and does not feel safe using insurance options.  Patient can not afford private pay for personal care.  Patient Goals/Self-Care Activities:  Patient will continue to use [employer] transportation and will connect with DSS in December for Energy Assistance program.  Plan:   No further follow up required: Patient does not request follow up.        Please call 911 if you are experiencing a Mental Health or Behavioral Health Crisis or need someone to talk to.  Patient verbalizes understanding of instructions and care plan provided today and agrees to view in MyChart. Active MyChart status and patient understanding of how to access instructions and care plan via MyChart confirmed with patient.     Tillman Gardener, BSW [place]  [facility], [facility] Social Worker Direct Dial: [phone]  Fax: [phone] Website: [email or web address]

## 2024-01-24 ENCOUNTER — Inpatient Hospital Stay

## 2024-01-24 ENCOUNTER — Inpatient Hospital Stay: Attending: Hematology

## 2024-01-24 VITALS — BP 109/54 | HR 55 | Temp 97.8°F | Resp 19

## 2024-01-24 DIAGNOSIS — N1832 Chronic kidney disease, stage 3b: Secondary | ICD-10-CM | POA: Diagnosis not present

## 2024-01-24 DIAGNOSIS — D631 Anemia in chronic kidney disease: Secondary | ICD-10-CM | POA: Diagnosis not present

## 2024-01-24 DIAGNOSIS — D649 Anemia, unspecified: Secondary | ICD-10-CM

## 2024-01-24 DIAGNOSIS — C7A010 Malignant carcinoid tumor of the duodenum: Secondary | ICD-10-CM | POA: Insufficient documentation

## 2024-01-24 DIAGNOSIS — I129 Hypertensive chronic kidney disease with stage 1 through stage 4 chronic kidney disease, or unspecified chronic kidney disease: Secondary | ICD-10-CM | POA: Insufficient documentation

## 2024-01-24 DIAGNOSIS — D500 Iron deficiency anemia secondary to blood loss (chronic): Secondary | ICD-10-CM

## 2024-01-24 DIAGNOSIS — Z79899 Other long term (current) drug therapy: Secondary | ICD-10-CM | POA: Diagnosis not present

## 2024-01-24 LAB — CBC WITH DIFFERENTIAL/PLATELET
Abs Immature Granulocytes: 0 K/uL (ref 0.00–0.07)
Basophils Absolute: 0 K/uL (ref 0.0–0.1)
Basophils Relative: 1 %
Eosinophils Absolute: 0.4 K/uL (ref 0.0–0.5)
Eosinophils Relative: 11 %
HCT: 28.1 % — ABNORMAL LOW (ref 36.0–46.0)
Hemoglobin: 9 g/dL — ABNORMAL LOW (ref 12.0–15.0)
Immature Granulocytes: 0 %
Lymphocytes Relative: 21 %
Lymphs Abs: 0.8 K/uL (ref 0.7–4.0)
MCH: 31.6 pg (ref 26.0–34.0)
MCHC: 32 g/dL (ref 30.0–36.0)
MCV: 98.6 fL (ref 80.0–100.0)
Monocytes Absolute: 0.4 K/uL (ref 0.1–1.0)
Monocytes Relative: 11 %
Neutro Abs: 2.2 K/uL (ref 1.7–7.7)
Neutrophils Relative %: 56 %
Platelets: 178 K/uL (ref 150–400)
RBC: 2.85 MIL/uL — ABNORMAL LOW (ref 3.87–5.11)
RDW: 14.3 % (ref 11.5–15.5)
WBC: 3.8 K/uL — ABNORMAL LOW (ref 4.0–10.5)
nRBC: 0 % (ref 0.0–0.2)

## 2024-01-24 LAB — SAMPLE TO BLOOD BANK

## 2024-01-24 MED ORDER — CYANOCOBALAMIN 1000 MCG/ML IJ SOLN
1000.0000 ug | Freq: Once | INTRAMUSCULAR | Status: AC
  Administered 2024-01-24: 1000 ug via INTRAMUSCULAR
  Filled 2024-01-24: qty 1

## 2024-01-24 MED ORDER — EPOETIN ALFA-EPBX 20000 UNIT/ML IJ SOLN
20000.0000 [IU] | Freq: Once | INTRAMUSCULAR | Status: AC
  Administered 2024-01-24: 20000 [IU] via SUBCUTANEOUS
  Filled 2024-01-24: qty 1

## 2024-01-24 NOTE — Progress Notes (Signed)
 Hemoglobin today is 9.0.  We will proceed with Retacrit  and Vitamin B12 injections per provider orders.   Patient tolerated injections with no complaints voiced.  Site clean and dry with no bruising or swelling noted.  No complaints of pain.  Discharged with vital signs stable and no signs or symptoms of distress noted.

## 2024-01-24 NOTE — Patient Instructions (Signed)
 [facility] [place] - [facility] DE[facility]  Discharge Instructions: Thank you for choosing [facility] to provide your oncology and hematology care.  If you have a lab appointment with the Cancer Center - please note that after April 8th, 2024, all labs will be drawn in the cancer center.  You do not have to check in or register with the main entrance as you have in the past but will complete your check-in in the cancer center.  Wear comfortable clothing and clothing appropriate for easy access to any Portacath or PICC line.   We strive to give you quality time with your provider. You may need to reschedule your appointment if you arrive late (15 or more minutes).  Arriving late affects you and other patients whose appointments are after yours.  Also, if you miss three or more appointments without notifying the office, you may be dismissed from the clinic at the provider's discretion.      For prescription refill requests, have your pharmacy contact our office and allow 72 hours for refills to be completed.    Today you received the following:  Retacrit/Vitamin B12.  Epoetin Alfa Injection What is this medication? EPOETIN ALFA (e POE e tin AL fa) treats low levels of red blood cells (anemia) caused by kidney disease, chemotherapy, or HIV medications. It can also be used in people who are at risk for blood loss during surgery. It works by Systems analyst make more red blood cells, which reduces the need for blood transfusions. This medicine may be used for other purposes; ask your health care provider or pharmacist if you have questions. COMMON BRAND NAME(S): Epogen, Procrit, Retacrit What should I tell my care team before I take this medication? They need to know if you have any of these conditions: Blood clots Cancer Heart disease High blood pressure On dialysis Seizures Stroke An unusual or allergic reaction to epoetin alfa, albumin, benzyl  alcohol, other medications, foods, dyes, or preservatives Pregnant or trying to get pregnant Breast-feeding How should I use this medication? This medication is injected into a vein or under the skin. It is usually given by your care team in a hospital or clinic setting. It may also be given at home. If you get this medication at home, you will be taught how to prepare and give it. Use exactly as directed. Take it as directed on the prescription label at the same time every day. Keep taking it unless your care team tells you to stop. It is important that you put your used needles and syringes in a special sharps container. Do not put them in a trash can. If you do not have a sharps container, call your pharmacist or care team to get one. A special MedGuide will be given to you by the pharmacist with each prescription and refill. Be sure to read this information carefully each time. Talk to your care team about the use of this medication in children. While this medication may be used in children as young as 1 month of age for selected conditions, precautions do apply. Overdosage: If you think you have taken too much of this medicine contact a poison control center or emergency room at once. NOTE: This medicine is only for you. Do not share this medicine with others. What if I miss a dose? If you miss a dose, take it as soon as you can. If it is almost time for your  next dose, take only that dose. Do not take double or extra doses. What may interact with this medication? Darbepoetin alfa Methoxy polyethylene glycol-epoetin beta This list may not describe all possible interactions. Give your health care provider a list of all the medicines, herbs, non-prescription drugs, or dietary supplements you use. Also tell them if you smoke, drink alcohol, or use illegal drugs. Some items may interact with your medicine. What should I watch for while using this medication? Visit your care team for regular checks  on your progress. Check your blood pressure as directed. Know what your blood pressure should be and when to contact your care team. Your condition will be monitored carefully while you are receiving this medication. You may need blood work while taking this medication. What side effects may I notice from receiving this medication? Side effects that you should report to your care team as soon as possible: Allergic reactions--skin rash, itching, hives, swelling of the face, lips, tongue, or throat Blood clot--pain, swelling, or warmth in the leg, shortness of breath, chest pain Heart attack--pain or tightness in the chest, shoulders, arms, or jaw, nausea, shortness of breath, cold or clammy skin, feeling faint or lightheaded Increase in blood pressure Rash, fever, and swollen lymph nodes Redness, blistering, peeling, or loosening of the skin, including inside the mouth Seizures Stroke--sudden numbness or weakness of the face, arm, or leg, trouble speaking, confusion, trouble walking, loss of balance or coordination, dizziness, severe headache, change in vision Side effects that usually do not require medical attention (report to your care team if they continue or are bothersome): Bone, joint, or muscle pain Cough Headache Nausea Pain, redness, or irritation at injection site This list may not describe all possible side effects. Call your doctor for medical advice about side effects. You may report side effects to FDA at 1-800-FDA-1088. Where should I keep my medication? Keep out of the reach of children and pets. Store in a refrigerator. Do not freeze. Do not shake. Protect from light. Keep this medication in the original container until you are ready to take it. See product for storage information. Get rid of any unused medication after the expiration date. To get rid of medications that are no longer needed or have expired: Take the medication to a medication take-back program. Check with  your pharmacy or law enforcement to find a location. If you cannot return the medication, ask your pharmacist or care team how to get rid of the medication safely. NOTE: This sheet is a summary. It may not cover all possible information. If you have questions about this medicine, talk to your doctor, pharmacist, or health care provider.  2024 Elsevier/Gold Standard (2021-10-20 00:00:00)    Vitamin B12 Injection What is this medication? Vitamin B12 (VAHY tuh min B12) prevents and treats low vitamin B12 levels in your body. It is used in people who do not get enough vitamin B12 from their diet or when their digestive tract does not absorb enough. Vitamin B12 plays an important role in maintaining the health of your nervous system and red blood cells. This medicine may be used for other purposes; ask your health care provider or pharmacist if you have questions. COMMON BRAND NAME(S): B-12 Compliance Kit, B-12 Injection Kit, Cyomin, Dodex, LA-12, Nutri-Twelve, Physicians EZ Use B-12, Primabalt, Vitamin Deficiency Injectable System - B12 What should I tell my care team before I take this medication? They need to know if you have any of these conditions: Kidney disease Leber's disease Megaloblastic  anemia An unusual or allergic reaction to cyanocobalamin, cobalt, other medications, foods, dyes, or preservatives Pregnant or trying to get pregnant Breast-feeding How should I use this medication? This medication is injected into a muscle or deeply under the skin. It is usually given in a clinic or care team's office. However, your care team may teach you how to inject yourself. Follow all instructions. Talk to your care team about the use of this medication in children. Special care may be needed. Overdosage: If you think you have taken too much of this medicine contact a poison control center or emergency room at once. NOTE: This medicine is only for you. Do not share this medicine with  others. What if I miss a dose? If you are given your dose at a clinic or care team's office, call to reschedule your appointment. If you give your own injections, and you miss a dose, take it as soon as you can. If it is almost time for your next dose, take only that dose. Do not take double or extra doses. What may interact with this medication? Alcohol Colchicine This list may not describe all possible interactions. Give your health care provider a list of all the medicines, herbs, non-prescription drugs, or dietary supplements you use. Also tell them if you smoke, drink alcohol, or use illegal drugs. Some items may interact with your medicine. What should I watch for while using this medication? Visit your care team regularly. You may need blood work done while you are taking this medication. You may need to follow a special diet. Talk to your care team. Limit your alcohol intake and avoid smoking to get the best benefit. What side effects may I notice from receiving this medication? Side effects that you should report to your care team as soon as possible: Allergic reactions--skin rash, itching, hives, swelling of the face, lips, tongue, or throat Swelling of the ankles, hands, or feet Trouble breathing Side effects that usually do not require medical attention (report to your care team if they continue or are bothersome): Diarrhea This list may not describe all possible side effects. Call your doctor for medical advice about side effects. You may report side effects to FDA at 1-800-FDA-1088. Where should I keep my medication? Keep out of the reach of children. Store at room temperature between 15 and 30 degrees C (59 and 85 degrees F). Protect from light. Throw away any unused medication after the expiration date. NOTE: This sheet is a summary. It may not cover all possible information. If you have questions about this medicine, talk to your doctor, pharmacist, or health care provider.   2024 Elsevier/Gold Standard (2021-02-28 00:00:00)     To help prevent nausea and vomiting after your treatment, we encourage you to take your nausea medication as directed.  BELOW ARE SYMPTOMS THAT SHOULD BE REPORTED IMMEDIATELY: *FEVER GREATER THAN 100.4 F (38 C) OR HIGHER *CHILLS OR SWEATING *NAUSEA AND VOMITING THAT IS NOT CONTROLLED WITH YOUR NAUSEA MEDICATION *UNUSUAL SHORTNESS OF BREATH *UNUSUAL BRUISING OR BLEEDING *URINARY PROBLEMS (pain or burning when urinating, or frequent urination) *BOWEL PROBLEMS (unusual diarrhea, constipation, pain near the anus) TENDERNESS IN MOUTH AND THROAT WITH OR WITHOUT PRESENCE OF ULCERS (sore throat, sores in mouth, or a toothache) UNUSUAL RASH, SWELLING OR PAIN  UNUSUAL VAGINAL DISCHARGE OR ITCHING   Items with * indicate a potential emergency and should be followed up as soon as possible or go to the Emergency Department if any problems should occur.  Please  show the CHEMOTHERAPY ALERT CARD or IMMUNOTHERAPY ALERT CARD at check-in to the Emergency Department and triage nurse.  Should you have questions after your visit or need to cancel or reschedule your appointment, please contact [facility]  - A DEPT OF [facility] [phone]  and follow the prompts.  Office hours are 8:00 a.m. to 4:30 p.m. Monday - Friday. Please note that voicemails left after 4:00 p.m. may not be returned until the following business day.  We are closed weekends and major holidays. You have access to a nurse at all times for urgent questions. Please call the main number to the clinic [phone] and follow the prompts.  For any non-urgent questions, you may also contact your provider using MyChart. We now offer e-Visits for anyone 31 and older to request care online for non-urgent symptoms. For details visit [email or web address].   Also download the MyChart app! Go to the app store, search "MyChart", open the app, select [place], and  log in with your MyChart username and password.

## 2024-01-28 ENCOUNTER — Telehealth: Admitting: *Deleted

## 2024-01-29 ENCOUNTER — Ambulatory Visit (HOSPITAL_COMMUNITY)
Admission: RE | Admit: 2024-01-29 | Discharge: 2024-01-29 | Disposition: A | Discharge status: Home / Self Care | Source: Ambulatory Visit | Attending: Family Medicine | Admitting: Family Medicine

## 2024-01-29 ENCOUNTER — Ambulatory Visit: Payer: Self-pay | Admitting: Family Medicine

## 2024-01-29 DIAGNOSIS — Z1231 Encounter for screening mammogram for malignant neoplasm of breast: Secondary | ICD-10-CM | POA: Insufficient documentation

## 2024-01-29 DIAGNOSIS — Z780 Asymptomatic menopausal state: Secondary | ICD-10-CM | POA: Insufficient documentation

## 2024-02-04 ENCOUNTER — Other Ambulatory Visit: Payer: Self-pay | Admitting: Nurse Practitioner

## 2024-02-05 ENCOUNTER — Other Ambulatory Visit: Payer: Self-pay

## 2024-02-05 MED ORDER — ASPIRIN 81 MG PO TBEC
81.0000 mg | DELAYED_RELEASE_TABLET | Freq: Every day | ORAL | 1 refills | Status: AC
Start: 2024-02-05 — End: ?

## 2024-02-06 DIAGNOSIS — D638 Anemia in other chronic diseases classified elsewhere: Secondary | ICD-10-CM | POA: Diagnosis not present

## 2024-02-06 DIAGNOSIS — N1832 Chronic kidney disease, stage 3b: Secondary | ICD-10-CM | POA: Diagnosis not present

## 2024-02-06 DIAGNOSIS — I129 Hypertensive chronic kidney disease with stage 1 through stage 4 chronic kidney disease, or unspecified chronic kidney disease: Secondary | ICD-10-CM | POA: Diagnosis not present

## 2024-02-10 ENCOUNTER — Telehealth: Payer: Self-pay | Admitting: *Deleted

## 2024-02-10 ENCOUNTER — Encounter: Payer: Self-pay | Admitting: *Deleted

## 2024-02-10 NOTE — Patient Instructions (Signed)
 Perle P Guagliardo - I am sorry I was unable to reach you today for our scheduled appointment. I work with Antonetta Rollene BRAVO, MD and am calling to support your healthcare needs. Please contact me at [phone] at your earliest convenience. I look forward to speaking with you soon.   Thank you,  Dorsey Charette, RN, BSN, ACM RN Care Manager [employer] [phone]

## 2024-02-21 ENCOUNTER — Inpatient Hospital Stay

## 2024-02-21 ENCOUNTER — Other Ambulatory Visit (HOSPITAL_COMMUNITY)
Admission: RE | Admit: 2024-02-21 | Discharge: 2024-02-21 | Disposition: A | Discharge status: Home / Self Care | Source: Ambulatory Visit | Attending: Nephrology | Admitting: Nephrology

## 2024-02-21 ENCOUNTER — Inpatient Hospital Stay: Attending: Hematology | Admitting: Oncology

## 2024-02-21 VITALS — BP 114/68 | HR 75 | Temp 97.5°F | Resp 16 | Wt 142.0 lb

## 2024-02-21 DIAGNOSIS — D649 Anemia, unspecified: Secondary | ICD-10-CM

## 2024-02-21 DIAGNOSIS — D500 Iron deficiency anemia secondary to blood loss (chronic): Secondary | ICD-10-CM

## 2024-02-21 DIAGNOSIS — Z79899 Other long term (current) drug therapy: Secondary | ICD-10-CM | POA: Diagnosis not present

## 2024-02-21 DIAGNOSIS — K5900 Constipation, unspecified: Secondary | ICD-10-CM | POA: Insufficient documentation

## 2024-02-21 DIAGNOSIS — C7A010 Malignant carcinoid tumor of the duodenum: Secondary | ICD-10-CM | POA: Insufficient documentation

## 2024-02-21 DIAGNOSIS — K922 Gastrointestinal hemorrhage, unspecified: Secondary | ICD-10-CM | POA: Diagnosis not present

## 2024-02-21 DIAGNOSIS — D3A010 Benign carcinoid tumor of the duodenum: Secondary | ICD-10-CM

## 2024-02-21 DIAGNOSIS — N1832 Chronic kidney disease, stage 3b: Secondary | ICD-10-CM | POA: Insufficient documentation

## 2024-02-21 DIAGNOSIS — D631 Anemia in chronic kidney disease: Secondary | ICD-10-CM | POA: Insufficient documentation

## 2024-02-21 DIAGNOSIS — E538 Deficiency of other specified B group vitamins: Secondary | ICD-10-CM | POA: Diagnosis not present

## 2024-02-21 DIAGNOSIS — I4891 Unspecified atrial fibrillation: Secondary | ICD-10-CM | POA: Insufficient documentation

## 2024-02-21 DIAGNOSIS — D472 Monoclonal gammopathy: Secondary | ICD-10-CM | POA: Insufficient documentation

## 2024-02-21 LAB — COMPREHENSIVE METABOLIC PANEL WITH GFR
ALT: 14 U/L (ref 0–44)
AST: 23 U/L (ref 15–41)
Albumin: 3.9 g/dL (ref 3.5–5.0)
Alkaline Phosphatase: 94 U/L (ref 38–126)
Anion gap: 11 (ref 5–15)
BUN: 26 mg/dL — ABNORMAL HIGH (ref 8–23)
CO2: 19 mmol/L — ABNORMAL LOW (ref 22–32)
Calcium: 10 mg/dL (ref 8.9–10.3)
Chloride: 108 mmol/L (ref 98–111)
Creatinine, Ser: 1.39 mg/dL — ABNORMAL HIGH (ref 0.44–1.00)
GFR, Estimated: 38 mL/min — ABNORMAL LOW (ref >60)
Glucose, Bld: 92 mg/dL (ref 70–99)
Potassium: 4 mmol/L (ref 3.5–5.1)
Sodium: 138 mmol/L (ref 135–145)
Total Bilirubin: 0.5 mg/dL (ref 0.0–1.2)
Total Protein: 7.6 g/dL (ref 6.5–8.1)

## 2024-02-21 LAB — CBC WITH DIFFERENTIAL/PLATELET
Abs Immature Granulocytes: 0.01 K/uL (ref 0.00–0.07)
Basophils Absolute: 0.1 K/uL (ref 0.0–0.1)
Basophils Relative: 1 %
Eosinophils Absolute: 0.3 K/uL (ref 0.0–0.5)
Eosinophils Relative: 8 %
HCT: 29.4 % — ABNORMAL LOW (ref 36.0–46.0)
Hemoglobin: 9.6 g/dL — ABNORMAL LOW (ref 12.0–15.0)
Immature Granulocytes: 0 %
Lymphocytes Relative: 20 %
Lymphs Abs: 0.8 K/uL (ref 0.7–4.0)
MCH: 31.2 pg (ref 26.0–34.0)
MCHC: 32.7 g/dL (ref 30.0–36.0)
MCV: 95.5 fL (ref 80.0–100.0)
Monocytes Absolute: 0.4 K/uL (ref 0.1–1.0)
Monocytes Relative: 9 %
Neutro Abs: 2.5 K/uL (ref 1.7–7.7)
Neutrophils Relative %: 62 %
Platelets: 172 K/uL (ref 150–400)
RBC: 3.08 MIL/uL — ABNORMAL LOW (ref 3.87–5.11)
RDW: 13.5 % (ref 11.5–15.5)
WBC: 4.1 K/uL (ref 4.0–10.5)
nRBC: 0 % (ref 0.0–0.2)

## 2024-02-21 LAB — IRON AND TIBC
Iron: 59 ug/dL (ref 28–170)
Saturation Ratios: 18 % (ref 10.4–31.8)
TIBC: 332 ug/dL (ref 250–450)
UIBC: 273 ug/dL

## 2024-02-21 LAB — SAMPLE TO BLOOD BANK

## 2024-02-21 LAB — RENAL FUNCTION PANEL
Albumin: 3.9 g/dL (ref 3.5–5.0)
Anion gap: 7 (ref 5–15)
BUN: 25 mg/dL — ABNORMAL HIGH (ref 8–23)
CO2: 20 mmol/L — ABNORMAL LOW (ref 22–32)
Calcium: 9.9 mg/dL (ref 8.9–10.3)
Chloride: 110 mmol/L (ref 98–111)
Creatinine, Ser: 1.39 mg/dL — ABNORMAL HIGH (ref 0.44–1.00)
GFR, Estimated: 38 mL/min — ABNORMAL LOW (ref >60)
Glucose, Bld: 88 mg/dL (ref 70–99)
Phosphorus: 2.9 mg/dL (ref 2.5–4.6)
Potassium: 4 mmol/L (ref 3.5–5.1)
Sodium: 137 mmol/L (ref 135–145)

## 2024-02-21 LAB — FOLATE: Folate: 12.5 ng/mL (ref >5.9)

## 2024-02-21 LAB — PROTEIN / CREATININE RATIO, URINE
Creatinine, Urine: 47 mg/dL
Protein Creatinine Ratio: 0.49 mg/mg{creat} — ABNORMAL HIGH (ref 0.00–0.15)
Total Protein, Urine: 23 mg/dL

## 2024-02-21 LAB — FERRITIN: Ferritin: 42 ng/mL (ref 11–307)

## 2024-02-21 LAB — VITAMIN B12: Vitamin B-12: 954 pg/mL — ABNORMAL HIGH (ref 180–914)

## 2024-02-21 MED ORDER — CYANOCOBALAMIN 1000 MCG/ML IJ SOLN
1000.0000 ug | Freq: Once | INTRAMUSCULAR | Status: AC
  Administered 2024-02-21: 1000 ug via INTRAMUSCULAR
  Filled 2024-02-21: qty 1

## 2024-02-21 MED ORDER — EPOETIN ALFA-EPBX 20000 UNIT/ML IJ SOLN
20000.0000 [IU] | Freq: Once | INTRAMUSCULAR | Status: AC
  Administered 2024-02-21: 20000 [IU] via SUBCUTANEOUS
  Filled 2024-02-21: qty 1

## 2024-02-21 NOTE — Assessment & Plan Note (Addendum)
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 -Previous workup showed elevated kappa lambda light chain with a normal ratio.  Immunofixation shows IgG kappa monoclonal protein. -No crab criteria: Calcium  10.0, creatinine 1.39, hemoglobin 9.6 and no new bone pain. -Most recent MGUS labs are from May 2025 which were essentially unremarkable. -Recheck MGUS labs every 6 months.  Last on 12/27/23. Due in December.

## 2024-02-21 NOTE — Progress Notes (Signed)
 [name] [name] presents today for injection per the provider's orders.  Retacrit  20,000U and B12  administration without incident; injection site WNL; see MAR for injection details.  Patient tolerated procedure well and without incident.  No questions or complaints noted at this time. Patient's hemoglobin noted to be 9.6.  Discharged from clinic via wheelchair in stable condition. Alert and oriented x 3. F/U with [facility] as scheduled.

## 2024-02-21 NOTE — Assessment & Plan Note (Deleted)
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  -No evidence of bleeding per patient.  -She received IV Feraheme  last on 12/04/23 and 12/11/23.  -Iron  labs from today show improvement of her hemoglobin.  Iron  levels are pending. -She continues oral iron  325 mg daily with mild constipation. - She is currently receiving 20,000 units Retacrit  monthly. -Proceed with 20,000 units Retacrit  today for hemoglobin of 9.6. - Has been receiving B12 injections since December 2024.  Continue monthly B12 injections. -Continue monthly 20,000 units Retacrit .  Hold for hemoglobin 11 or greater. -Return to clinic monthly for labs plus or minus Retacrit  and B12 shots.  Return to clinic in approximately 12 weeks to see provider.

## 2024-02-21 NOTE — Patient Instructions (Signed)
 [facility] [place] - A DEPT OF [place]  Discharge Instructions: Thank you for choosing [facility] to provide your oncology and hematology care.  If you have a lab appointment with the Cancer Center - please note that after April 8th, 2024, all labs will be drawn in the cancer center.  You do not have to check in or register with the main entrance as you have in the past but will complete your check-in in the cancer center.  Wear comfortable clothing and clothing appropriate for easy access to any Portacath or PICC line.   We strive to give you quality time with your provider. You may need to reschedule your appointment if you arrive late (15 or more minutes).  Arriving late affects you and other patients whose appointments are after yours.  Also, if you miss three or more appointments without notifying the office, you may be dismissed from the clinic at the provider's discretion.      For prescription refill requests, have your pharmacy contact our office and allow 72 hours for refills to be completed.    Today you received B12 and Retacrit  20,000U injections       BELOW ARE SYMPTOMS THAT SHOULD BE REPORTED IMMEDIATELY: *FEVER GREATER THAN 100.4 F (38 C) OR HIGHER *CHILLS OR SWEATING *NAUSEA AND VOMITING THAT IS NOT CONTROLLED WITH YOUR NAUSEA MEDICATION *UNUSUAL SHORTNESS OF BREATH *UNUSUAL BRUISING OR BLEEDING *URINARY PROBLEMS (pain or burning when urinating, or frequent urination) *BOWEL PROBLEMS (unusual diarrhea, constipation, pain near the anus) TENDERNESS IN MOUTH AND THROAT WITH OR WITHOUT PRESENCE OF ULCERS (sore throat, sores in mouth, or a toothache) UNUSUAL RASH, SWELLING OR PAIN  UNUSUAL VAGINAL DISCHARGE OR ITCHING   Items with * indicate a potential emergency and should be followed up as soon as possible or go to the Emergency Department if any problems should occur.  Please show the CHEMOTHERAPY ALERT CARD or IMMUNOTHERAPY  ALERT CARD at check-in to the Emergency Department and triage nurse.  Should you have questions after your visit or need to cancel or reschedule your appointment, please contact [facility] [place] - A DEPT OF [facility] [phone]  and follow the prompts.  Office hours are 8:00 a.m. to 4:30 p.m. Monday - Friday. Please note that voicemails left after 4:00 p.m. may not be returned until the following business day.  We are closed weekends and major holidays. You have access to a nurse at all times for urgent questions. Please call the main number to the clinic [phone] and follow the prompts.  For any non-urgent questions, you may also contact your provider using MyChart. We now offer e-Visits for anyone 76 and older to request care online for non-urgent symptoms. For details visit [email or web address].   Also download the MyChart app! Go to the app store, search MyChart, open the app, select [place], and log in with your MyChart username and password.

## 2024-02-21 NOTE — Assessment & Plan Note (Addendum)
-   Dotatate PET scan from 12/06/2022: No evidence of neuroendocrine tumor or metastasis. 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  No evidence of metastatic neuroendocrine tumor.  Dr. Katragadda recommended this last dotatate PET scan and to only repeat as clinically indicated. - Most recent chromogranin A was 557.9 and 24-hour urine was not collected.  Check these every 6 months.  We will have her add this on today.

## 2024-02-21 NOTE — Assessment & Plan Note (Addendum)
-  B12 level has improved to 450 (363) since starting B12 injections. -Continue B12 injections monthly.  Proceed with B12 today. - Has been receiving B12 injections since December 2024.

## 2024-02-21 NOTE — Progress Notes (Signed)
 [facility] OFFICE PROGRESS NOTE  Antonetta Rollene BRAVO, MD  ASSESSMENT & PLAN:    Assessment & Plan Carcinoid tumor of duodenum, unspecified whether malignant - Dotatate PET scan from 12/06/2022: No evidence of neuroendocrine tumor or metastasis. 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  No evidence of metastatic neuroendocrine tumor.  Dr. Katragadda recommended this last dotatate PET scan and to only repeat as clinically indicated. - Most recent chromogranin A was 557.9 and 24-hour urine was not collected.  Check these every 6 months.  We will have her add this on today.  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 -Previous workup showed elevated kappa lambda light chain with a normal ratio.  Immunofixation shows IgG kappa monoclonal protein. -No crab criteria: Calcium  10.0, creatinine 1.39, hemoglobin 9.6 and no new bone pain. -Most recent MGUS labs are from May 2025 which were essentially unremarkable. -Recheck MGUS labs every 6 months.  Last on 12/27/23. Due in December.    Vitamin B12 deficiency disease -B12 level has improved to 450 (363) since starting B12 injections. -Continue B12 injections monthly.  Proceed with B12 today. - Has been receiving B12 injections since December 2024.  Anemia due to stage 3b chronic kidney disease (HCC) -She is currently receiving 20,000 units Retacrit  monthly. -Proceed with 20,000 units Retacrit  today for hemoglobin of 9.6. -Retacrit  appears to be holding her hemoglobin between 9 and 10. -Continue monthly 20,000 units Retacrit .  Hold for hemoglobin 11 or greater. -Return to clinic monthly for labs plus or minus Retacrit  and B12 shots.  Return to clinic in approximately 12 weeks to see provider. Iron  deficiency anemia due to chronic blood loss - Multifactorial anemia from  CKD, iron  deficiency and chronic blood loss.  -No evidence of bleeding per patient.  -She received IV Feraheme  last on 12/04/23 and 12/11/23.  -Iron  labs from today show improvement of her hemoglobin.  Iron  levels show ferritin of 42, iron  saturation 19% with a normal TIBC.  We discussed she could use 1 additional dose of IV iron  to get her iron  saturation closer to 30%.  Will get this scheduled for her. -She continues oral iron  325 mg daily with mild constipation. Take MiraLAX as needed.  Orders Placed This Encounter  Procedures   Methylmalonic acid, serum    Standing Status:   Future    Expected Date:   05/23/2024    Expiration Date:   08/21/2024   Folate    Standing Status:   Future    Expected Date:   05/23/2024    Expiration Date:   08/21/2024   Vitamin B12    Standing Status:   Future    Expected Date:   05/23/2024    Expiration Date:   08/21/2024   Iron  and TIBC ([facility] ONLY)    Standing Status:   Future    Expected Date:   05/23/2024    Expiration Date:   08/21/2024   Ferritin    Standing Status:   Future    Expected Date:   05/23/2024    Expiration Date:   08/21/2024   Comprehensive metabolic panel    Standing Status:   Future    Expected Date:   05/23/2024    Expiration Date:   08/21/2024   CBC with Differential    Standing Status:   Future  Expected Date:   05/23/2024    Expiration Date:   08/21/2024   Chromogranin A    Standing Status:   Future    Expected Date:   05/23/2024    Expiration Date:   02/20/2025   Immunofixation electrophoresis    Standing Status:   Future    Expected Date:   05/25/2024    Expiration Date:   08/23/2024   Protein electrophoresis, serum    Standing Status:   Future    Expected Date:   05/25/2024    Expiration Date:   08/23/2024   Kappa/lambda light chains    Standing Status:   Future    Expected Date:   05/25/2024    Expiration Date:   08/23/2024   5 HIAA, quantitative, urine, 24 hour    Standing Status:   Future     Expected Date:   05/25/2024    Expiration Date:   08/23/2024    INTERVAL HISTORY: Patient returns for follow-up.  Patient reports overall doing well.  No change from previous appetite and energy levels are 100%.  She has numbness and tingling in her fingers and chest pain at times when she is in atrial fibrillation.  Otherwise she is doing well.  We reviewed CBC, CMP, iron  panel, B12 and folate levels.  SUMMARY OF HEMATOLOGIC HISTORY: Oncology History   No history exists.   1.  Duodenal neuroendocrine tumor: - 04/27/2021 (duodenal mass biopsy): Well-differentiated carcinoid tumor, grade 1, Ki-67 1%. - Dotatate PET (05/2021): No focal activity within the duodenum or evidence of metastatic disease. - EGD/EUS (08/2021): Submucosal nodule found in the duodenum-consistent with previously biopsied duodenal NET.  An intramural subepithelial lesion found in the second part of the duodenum.  Lesion appears to originate from within the deep mucosa and submucosa.  She was not felt to be candidate for EMR. - Patient had traumatic experience after the EUS and did not want to pursue further options and declined a referral to [facility]. - Denies any diarrhea/flushing/wheezing/abdominal pain.  No obstructive symptoms.   2.  Normocytic anemia: - More than 5-year history of iron  deficiency anemia secondary to GI bleed.  She underwent multiple EGDs and colonoscopies and 2 small bowel capsule endoscopies. - She reports taking iron  tablets daily for the past 3 years.  Denies any ice pica. - Denies any BRBPR/melena.  She had blood transfusion 01/09/2023. -She also receives intermittent IV iron .  Last IV Feraheme  was given on 03/01/2023.   CBC    Component Value Date/Time   WBC 4.1 02/21/2024 0848   RBC 3.08 (L) 02/21/2024 0848   HGB 9.6 (L) 02/21/2024 0848   HGB 8.2 (L) 01/15/2023 1613   HCT 29.4 (L) 02/21/2024 0848   HCT 25.5 (L) 01/15/2023 1613   PLT 172 02/21/2024 0848   PLT 196 01/15/2023 1613    MCV 95.5 02/21/2024 0848   MCV 99 (H) 01/15/2023 1613   MCH 31.2 02/21/2024 0848   MCHC 32.7 02/21/2024 0848   RDW 13.5 02/21/2024 0848   RDW 13.2 01/15/2023 1613   LYMPHSABS 0.8 02/21/2024 0848   MONOABS 0.4 02/21/2024 0848   EOSABS 0.3 02/21/2024 0848   BASOSABS 0.1 02/21/2024 0848       Latest Ref Rng & Units 02/21/2024   11:25 AM 02/21/2024    8:48 AM 12/27/2023   11:06 AM  CMP  Glucose 70 - 99 mg/dL 88  92  897   BUN 8 - 23 mg/dL 25  26  33  Creatinine 0.44 - 1.00 mg/dL 8.60  8.60  8.30   Sodium 135 - 145 mmol/L 137  138  140   Potassium 3.5 - 5.1 mmol/L 4.0  4.0  4.9   Chloride 98 - 111 mmol/L 110  108  110   CO2 22 - 32 mmol/L 20  19  21    Calcium  8.9 - 10.3 mg/dL 9.9  89.9  9.7   Total Protein 6.5 - 8.1 g/dL  7.6    Total Bilirubin 0.0 - 1.2 mg/dL  0.5    Alkaline Phos 38 - 126 U/L  94    AST 15 - 41 U/L  23    ALT 0 - 44 U/L  14       Lab Results  Component Value Date   FERRITIN 42 02/21/2024   VITAMINB12 954 (H) 02/21/2024    Vitals:   02/21/24 0928  BP: 114/68  Pulse: 75  Resp: 16  Temp: (!) 97.5 F (36.4 C)  SpO2: 95%    Review of System:  Review of Systems  Constitutional:  Positive for malaise/fatigue.  Cardiovascular:  Positive for palpitations.  Neurological:  Positive for tingling.    Physical Exam: Physical Exam Constitutional:      Appearance: Normal appearance.  HENT:     Head: Normocephalic and atraumatic.  Eyes:     Pupils: Pupils are equal, round, and reactive to light.  Cardiovascular:     Rate and Rhythm: Normal rate and regular rhythm.     Heart sounds: Normal heart sounds. No murmur heard. Pulmonary:     Effort: Pulmonary effort is normal.     Breath sounds: Normal breath sounds. No wheezing.  Abdominal:     General: Bowel sounds are normal. There is no distension.     Palpations: Abdomen is soft.     Tenderness: There is no abdominal tenderness.  Musculoskeletal:        General: Normal range of motion.      Cervical back: Normal range of motion.  Skin:    General: Skin is warm and dry.     Findings: No rash.  Neurological:     Mental Status: She is alert and oriented to person, place, and time.     Gait: Gait is intact.  Psychiatric:        Mood and Affect: Mood and affect normal.        Cognition and Memory: Memory normal.        Judgment: Judgment normal.      I spent 25 minutes dedicated to the care of this patient (face-to-face and non-face-to-face) on the date of the encounter to include what is described in the assessment and plan.,  Delon Hope, NP 02/23/2024 7:43 AM

## 2024-02-22 LAB — CALCIUM / CREATININE RATIO, URINE
Calcium, Ur: <0.8 mg/dL
Calcium/Creat.Ratio: <19 mg/g{creat} — ABNORMAL LOW (ref 29–442)
Creatinine, Urine: 41.9 mg/dL

## 2024-02-23 ENCOUNTER — Encounter: Payer: Self-pay | Admitting: Oncology

## 2024-02-23 NOTE — Assessment & Plan Note (Signed)
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  -No evidence of bleeding per patient.  -She received IV Feraheme  last on 12/04/23 and 12/11/23.  -Iron  labs from today show improvement of her hemoglobin.  Iron  levels show ferritin of 42, iron  saturation 19% with a normal TIBC.  We discussed she could use 1 additional dose of IV iron  to get her iron  saturation closer to 30%.  Will get this scheduled for her. -She continues oral iron  325 mg daily with mild constipation. Take MiraLAX as needed.

## 2024-02-23 NOTE — Assessment & Plan Note (Signed)
-  She is currently receiving 20,000 units Retacrit  monthly. -Proceed with 20,000 units Retacrit  today for hemoglobin of 9.6. -Retacrit  appears to be holding her hemoglobin between 9 and 10. -Continue monthly 20,000 units Retacrit .  Hold for hemoglobin 11 or greater. -Return to clinic monthly for labs plus or minus Retacrit  and B12 shots.  Return to clinic in approximately 12 weeks to see provider.

## 2024-02-24 ENCOUNTER — Other Ambulatory Visit: Payer: Self-pay | Admitting: Family Medicine

## 2024-02-26 LAB — PARATHYROID HORMONE, INTACT (NO CA): PTH: 29 pg/mL (ref 15–65)

## 2024-02-27 LAB — METHYLMALONIC ACID, SERUM: Methylmalonic Acid, Quantitative: 139 nmol/L (ref 0–378)

## 2024-02-28 ENCOUNTER — Ambulatory Visit: Payer: Self-pay | Admitting: Oncology

## 2024-02-28 NOTE — Progress Notes (Signed)
 B12 can be reduced to every other day.   Delon Hope, NP 02/28/2024 1:02 PM

## 2024-03-02 DIAGNOSIS — R809 Proteinuria, unspecified: Secondary | ICD-10-CM | POA: Diagnosis not present

## 2024-03-02 DIAGNOSIS — D638 Anemia in other chronic diseases classified elsewhere: Secondary | ICD-10-CM | POA: Diagnosis not present

## 2024-03-02 DIAGNOSIS — I5032 Chronic diastolic (congestive) heart failure: Secondary | ICD-10-CM | POA: Diagnosis not present

## 2024-03-02 DIAGNOSIS — N1832 Chronic kidney disease, stage 3b: Secondary | ICD-10-CM | POA: Diagnosis not present

## 2024-03-03 ENCOUNTER — Encounter: Payer: Self-pay | Admitting: Oncology

## 2024-03-04 NOTE — Progress Notes (Signed)
 Sounds good. No change needed then.   Delon Hope, NP 03/04/2024 8:50 AM

## 2024-03-05 ENCOUNTER — Inpatient Hospital Stay: Attending: Hematology

## 2024-03-05 VITALS — BP 131/58 | HR 60 | Temp 97.4°F | Resp 18

## 2024-03-05 DIAGNOSIS — C7A010 Malignant carcinoid tumor of the duodenum: Secondary | ICD-10-CM | POA: Diagnosis not present

## 2024-03-05 DIAGNOSIS — D631 Anemia in chronic kidney disease: Secondary | ICD-10-CM | POA: Diagnosis not present

## 2024-03-05 DIAGNOSIS — Z79899 Other long term (current) drug therapy: Secondary | ICD-10-CM | POA: Diagnosis not present

## 2024-03-05 DIAGNOSIS — E538 Deficiency of other specified B group vitamins: Secondary | ICD-10-CM | POA: Diagnosis not present

## 2024-03-05 DIAGNOSIS — N1832 Chronic kidney disease, stage 3b: Secondary | ICD-10-CM | POA: Diagnosis not present

## 2024-03-05 DIAGNOSIS — D509 Iron deficiency anemia, unspecified: Secondary | ICD-10-CM | POA: Insufficient documentation

## 2024-03-05 DIAGNOSIS — D500 Iron deficiency anemia secondary to blood loss (chronic): Secondary | ICD-10-CM

## 2024-03-05 MED ORDER — SODIUM CHLORIDE 0.9 % IV SOLN
510.0000 mg | Freq: Once | INTRAVENOUS | Status: AC
  Administered 2024-03-05: 510 mg via INTRAVENOUS
  Filled 2024-03-05: qty 510

## 2024-03-05 MED ORDER — SODIUM CHLORIDE 0.9 % IV SOLN: Freq: Once | INTRAVENOUS | Status: AC

## 2024-03-05 NOTE — Progress Notes (Signed)
 Patient tolerated iron infusion with no complaints voiced.  Peripheral IV site clean and dry with good blood return noted before and after infusion.  Band aid applied.  VSS with discharge and left in satisfactory condition with no s/s of distress noted.

## 2024-03-05 NOTE — Patient Instructions (Signed)

## 2024-03-15 ENCOUNTER — Other Ambulatory Visit: Payer: Self-pay | Admitting: Cardiology

## 2024-03-16 DIAGNOSIS — E119 Type 2 diabetes mellitus without complications: Secondary | ICD-10-CM | POA: Diagnosis not present

## 2024-03-16 DIAGNOSIS — H04123 Dry eye syndrome of bilateral lacrimal glands: Secondary | ICD-10-CM | POA: Diagnosis not present

## 2024-03-16 DIAGNOSIS — H35361 Drusen (degenerative) of macula, right eye: Secondary | ICD-10-CM | POA: Diagnosis not present

## 2024-03-16 DIAGNOSIS — H16143 Punctate keratitis, bilateral: Secondary | ICD-10-CM | POA: Diagnosis not present

## 2024-03-16 DIAGNOSIS — H2513 Age-related nuclear cataract, bilateral: Secondary | ICD-10-CM | POA: Diagnosis not present

## 2024-03-16 DIAGNOSIS — H40053 Ocular hypertension, bilateral: Secondary | ICD-10-CM | POA: Diagnosis not present

## 2024-03-16 DIAGNOSIS — H16213 Exposure keratoconjunctivitis, bilateral: Secondary | ICD-10-CM | POA: Diagnosis not present

## 2024-03-18 ENCOUNTER — Ambulatory Visit: Admitting: Cardiology

## 2024-03-20 ENCOUNTER — Inpatient Hospital Stay

## 2024-03-20 VITALS — BP 108/64 | HR 53 | Temp 97.5°F | Resp 18

## 2024-03-20 DIAGNOSIS — D500 Iron deficiency anemia secondary to blood loss (chronic): Secondary | ICD-10-CM

## 2024-03-20 DIAGNOSIS — Z79899 Other long term (current) drug therapy: Secondary | ICD-10-CM | POA: Diagnosis not present

## 2024-03-20 DIAGNOSIS — D649 Anemia, unspecified: Secondary | ICD-10-CM

## 2024-03-20 DIAGNOSIS — D631 Anemia in chronic kidney disease: Secondary | ICD-10-CM

## 2024-03-20 DIAGNOSIS — N1832 Chronic kidney disease, stage 3b: Secondary | ICD-10-CM | POA: Diagnosis not present

## 2024-03-20 DIAGNOSIS — C7A010 Malignant carcinoid tumor of the duodenum: Secondary | ICD-10-CM | POA: Diagnosis not present

## 2024-03-20 DIAGNOSIS — D509 Iron deficiency anemia, unspecified: Secondary | ICD-10-CM | POA: Diagnosis not present

## 2024-03-20 DIAGNOSIS — E538 Deficiency of other specified B group vitamins: Secondary | ICD-10-CM | POA: Diagnosis not present

## 2024-03-20 LAB — CBC WITH DIFFERENTIAL/PLATELET
Abs Immature Granulocytes: 0.01 K/uL (ref 0.00–0.07)
Basophils Absolute: 0 K/uL (ref 0.0–0.1)
Basophils Relative: 1 %
Eosinophils Absolute: 0.4 K/uL (ref 0.0–0.5)
Eosinophils Relative: 9 %
HCT: 31.4 % — ABNORMAL LOW (ref 36.0–46.0)
Hemoglobin: 10 g/dL — ABNORMAL LOW (ref 12.0–15.0)
Immature Granulocytes: 0 %
Lymphocytes Relative: 21 %
Lymphs Abs: 0.9 K/uL (ref 0.7–4.0)
MCH: 30.5 pg (ref 26.0–34.0)
MCHC: 31.8 g/dL (ref 30.0–36.0)
MCV: 95.7 fL (ref 80.0–100.0)
Monocytes Absolute: 0.4 K/uL (ref 0.1–1.0)
Monocytes Relative: 9 %
Neutro Abs: 2.4 K/uL (ref 1.7–7.7)
Neutrophils Relative %: 60 %
Platelets: 174 K/uL (ref 150–400)
RBC: 3.28 MIL/uL — ABNORMAL LOW (ref 3.87–5.11)
RDW: 13.7 % (ref 11.5–15.5)
WBC: 4 K/uL (ref 4.0–10.5)
nRBC: 0 % (ref 0.0–0.2)

## 2024-03-20 LAB — SAMPLE TO BLOOD BANK

## 2024-03-20 MED ORDER — EPOETIN ALFA-EPBX 20000 UNIT/ML IJ SOLN
20000.0000 [IU] | Freq: Once | INTRAMUSCULAR | Status: AC
  Administered 2024-03-20: 20000 [IU] via SUBCUTANEOUS
  Filled 2024-03-20: qty 1

## 2024-03-20 MED ORDER — CYANOCOBALAMIN 1000 MCG/ML IJ SOLN
1000.0000 ug | Freq: Once | INTRAMUSCULAR | Status: AC
  Administered 2024-03-20: 1000 ug via INTRAMUSCULAR
  Filled 2024-03-20: qty 1

## 2024-03-20 NOTE — Progress Notes (Signed)
 Patient presents today for Retacrit  injection and B12 injection. Hemoglobin reviewed prior to administration. VSS tolerated without incident or complaint. See MAR for details. Patient stable during and after injection. Patient discharged in satisfactory condition with no s/s of distress noted.

## 2024-03-20 NOTE — Patient Instructions (Signed)
 [facility] [place] - A DEPT OF [facility]  Discharge Instructions: Thank you for choosing [facility] to provide your oncology and hematology care.  If you have a lab appointment with the Cancer Center - please note that after April 8th, 2024, all labs will be drawn in the cancer center.  You do not have to check in or register with the main entrance as you have in the past but will complete your check-in in the cancer center.  Wear comfortable clothing and clothing appropriate for easy access to any Portacath or PICC line.   We strive to give you quality time with your provider. You may need to reschedule your appointment if you arrive late (15 or more minutes).  Arriving late affects you and other patients whose appointments are after yours.  Also, if you miss three or more appointments without notifying the office, you may be dismissed from the clinic at the provider's discretion.      For prescription refill requests, have your pharmacy contact our office and allow 72 hours for refills to be completed.    Today you received the following Retacrit , and B12 injection, return as scheduled.   To help prevent nausea and vomiting after your treatment, we encourage you to take your nausea medication as directed.  BELOW ARE SYMPTOMS THAT SHOULD BE REPORTED IMMEDIATELY: *FEVER GREATER THAN 100.4 F (38 C) OR HIGHER *CHILLS OR SWEATING *NAUSEA AND VOMITING THAT IS NOT CONTROLLED WITH YOUR NAUSEA MEDICATION *UNUSUAL SHORTNESS OF BREATH *UNUSUAL BRUISING OR BLEEDING *URINARY PROBLEMS (pain or burning when urinating, or frequent urination) *BOWEL PROBLEMS (unusual diarrhea, constipation, pain near the anus) TENDERNESS IN MOUTH AND THROAT WITH OR WITHOUT PRESENCE OF ULCERS (sore throat, sores in mouth, or a toothache) UNUSUAL RASH, SWELLING OR PAIN  UNUSUAL VAGINAL DISCHARGE OR ITCHING   Items with * indicate a potential emergency and should be followed up as  soon as possible or go to the Emergency Department if any problems should occur.  Please show the CHEMOTHERAPY ALERT CARD or IMMUNOTHERAPY ALERT CARD at check-in to the Emergency Department and triage nurse.  Should you have questions after your visit or need to cancel or reschedule your appointment, please contact [facility] [place] - A DEPT OF [facility] [phone]  and follow the prompts.  Office hours are 8:00 a.m. to 4:30 p.m. Monday - Friday. Please note that voicemails left after 4:00 p.m. may not be returned until the following business day.  We are closed weekends and major holidays. You have access to a nurse at all times for urgent questions. Please call the main number to the clinic [phone] and follow the prompts.  For any non-urgent questions, you may also contact your provider using MyChart. We now offer e-Visits for anyone 61 and older to request care online for non-urgent symptoms. For details visit [email or web address].   Also download the MyChart app! Go to the app store, search MyChart, open the app, select [facility], and log in with your MyChart username and password.

## 2024-04-10 ENCOUNTER — Encounter: Payer: Self-pay | Admitting: Oncology

## 2024-04-15 DIAGNOSIS — H16213 Exposure keratoconjunctivitis, bilateral: Secondary | ICD-10-CM | POA: Diagnosis not present

## 2024-04-15 DIAGNOSIS — H2513 Age-related nuclear cataract, bilateral: Secondary | ICD-10-CM | POA: Diagnosis not present

## 2024-04-17 ENCOUNTER — Inpatient Hospital Stay: Attending: Hematology

## 2024-04-17 ENCOUNTER — Inpatient Hospital Stay

## 2024-04-17 VITALS — BP 117/61 | HR 56 | Temp 97.7°F | Resp 16

## 2024-04-17 DIAGNOSIS — C7A010 Malignant carcinoid tumor of the duodenum: Secondary | ICD-10-CM | POA: Diagnosis present

## 2024-04-17 DIAGNOSIS — I129 Hypertensive chronic kidney disease with stage 1 through stage 4 chronic kidney disease, or unspecified chronic kidney disease: Secondary | ICD-10-CM | POA: Insufficient documentation

## 2024-04-17 DIAGNOSIS — D500 Iron deficiency anemia secondary to blood loss (chronic): Secondary | ICD-10-CM

## 2024-04-17 DIAGNOSIS — Z79899 Other long term (current) drug therapy: Secondary | ICD-10-CM | POA: Insufficient documentation

## 2024-04-17 DIAGNOSIS — D649 Anemia, unspecified: Secondary | ICD-10-CM

## 2024-04-17 DIAGNOSIS — D631 Anemia in chronic kidney disease: Secondary | ICD-10-CM

## 2024-04-17 DIAGNOSIS — N1832 Chronic kidney disease, stage 3b: Secondary | ICD-10-CM | POA: Insufficient documentation

## 2024-04-17 LAB — CBC WITH DIFFERENTIAL/PLATELET
Abs Immature Granulocytes: 0.01 K/uL (ref 0.00–0.07)
Basophils Absolute: 0 K/uL (ref 0.0–0.1)
Basophils Relative: 1 %
Eosinophils Absolute: 0.4 K/uL (ref 0.0–0.5)
Eosinophils Relative: 9 %
HCT: 33.5 % — ABNORMAL LOW (ref 36.0–46.0)
Hemoglobin: 10.8 g/dL — ABNORMAL LOW (ref 12.0–15.0)
Immature Granulocytes: 0 %
Lymphocytes Relative: 24 %
Lymphs Abs: 1 K/uL (ref 0.7–4.0)
MCH: 30 pg (ref 26.0–34.0)
MCHC: 32.2 g/dL (ref 30.0–36.0)
MCV: 93.1 fL (ref 80.0–100.0)
Monocytes Absolute: 0.5 K/uL (ref 0.1–1.0)
Monocytes Relative: 11 %
Neutro Abs: 2.4 K/uL (ref 1.7–7.7)
Neutrophils Relative %: 55 %
Platelets: 166 K/uL (ref 150–400)
RBC: 3.6 MIL/uL — ABNORMAL LOW (ref 3.87–5.11)
RDW: 13.5 % (ref 11.5–15.5)
WBC: 4.4 K/uL (ref 4.0–10.5)
nRBC: 0 % (ref 0.0–0.2)

## 2024-04-17 LAB — SAMPLE TO BLOOD BANK

## 2024-04-17 MED ORDER — CYANOCOBALAMIN 1000 MCG/ML IJ SOLN: 1000.0000 ug | Freq: Once | INTRAMUSCULAR | Status: DC

## 2024-04-17 MED ORDER — CYANOCOBALAMIN 1000 MCG/ML IJ SOLN
1000.0000 ug | Freq: Once | INTRAMUSCULAR | Status: AC
  Administered 2024-04-17: 1000 ug via INTRAMUSCULAR
  Filled 2024-04-17: qty 1

## 2024-04-17 MED ORDER — EPOETIN ALFA-EPBX 20000 UNIT/ML IJ SOLN
20000.0000 [IU] | Freq: Once | INTRAMUSCULAR | Status: AC
  Administered 2024-04-17: 20000 [IU] via SUBCUTANEOUS
  Filled 2024-04-17: qty 1

## 2024-04-17 NOTE — Patient Instructions (Signed)
 [facility] [place] - A DEPT OF [facility]  Discharge Instructions: Thank you for choosing [facility] to provide your oncology and hematology care.  If you have a lab appointment with the Cancer Center - please note that after April 8th, 2024, all labs will be drawn in the cancer center.  You do not have to check in or register with the main entrance as you have in the past but will complete your check-in in the cancer center.  Wear comfortable clothing and clothing appropriate for easy access to any Portacath or PICC line.   We strive to give you quality time with your provider. You may need to reschedule your appointment if you arrive late (15 or more minutes).  Arriving late affects you and other patients whose appointments are after yours.  Also, if you miss three or more appointments without notifying the office, you may be dismissed from the clinic at the provider's discretion.      For prescription refill requests, have your pharmacy contact our office and allow 72 hours for refills to be completed.    Today you received Retacrit  20,000U and B12 injections     BELOW ARE SYMPTOMS THAT SHOULD BE REPORTED IMMEDIATELY: *FEVER GREATER THAN 100.4 F (38 C) OR HIGHER *CHILLS OR SWEATING *NAUSEA AND VOMITING THAT IS NOT CONTROLLED WITH YOUR NAUSEA MEDICATION *UNUSUAL SHORTNESS OF BREATH *UNUSUAL BRUISING OR BLEEDING *URINARY PROBLEMS (pain or burning when urinating, or frequent urination) *BOWEL PROBLEMS (unusual diarrhea, constipation, pain near the anus) TENDERNESS IN MOUTH AND THROAT WITH OR WITHOUT PRESENCE OF ULCERS (sore throat, sores in mouth, or a toothache) UNUSUAL RASH, SWELLING OR PAIN  UNUSUAL VAGINAL DISCHARGE OR ITCHING   Items with * indicate a potential emergency and should be followed up as soon as possible or go to the Emergency Department if any problems should occur.  Please show the CHEMOTHERAPY ALERT CARD or IMMUNOTHERAPY ALERT  CARD at check-in to the Emergency Department and triage nurse.  Should you have questions after your visit or need to cancel or reschedule your appointment, please contact [facility] [place] - A DEPT OF [facility] [phone]  and follow the prompts.  Office hours are 8:00 a.m. to 4:30 p.m. Monday - Friday. Please note that voicemails left after 4:00 p.m. may not be returned until the following business day.  We are closed weekends and major holidays. You have access to a nurse at all times for urgent questions. Please call the main number to the clinic [phone] and follow the prompts.  For any non-urgent questions, you may also contact your provider using MyChart. We now offer e-Visits for anyone 58 and older to request care online for non-urgent symptoms. For details visit [email or web address].   Also download the MyChart app! Go to the app store, search "MyChart", open the app, select [place], and log in with your MyChart username and password.

## 2024-04-17 NOTE — Progress Notes (Signed)
 [name] [name] presents today for injection per the provider's orders.  Retacrit  20,000U/B12 administration without incident; injection site WNL; see MAR for injection details.  Patient tolerated procedure well and without incident.  No questions or complaints noted at this time. Patient's hemoglobin noted to be 10.8 today.  Treatment given today per MD orders. Tolerated infusion without adverse affects. Vital signs stable. No complaints at this time. Discharged from clinic via wheelchair in stable condition. Alert and oriented x 3. F/U with [facility] as scheduled.

## 2024-04-21 ENCOUNTER — Ambulatory Visit: Payer: Self-pay

## 2024-04-29 ENCOUNTER — Ambulatory Visit: Admitting: Cardiology

## 2024-04-30 ENCOUNTER — Ambulatory Visit: Admitting: Family Medicine

## 2024-05-15 ENCOUNTER — Inpatient Hospital Stay

## 2024-05-15 ENCOUNTER — Inpatient Hospital Stay: Admitting: Oncology

## 2024-05-21 ENCOUNTER — Inpatient Hospital Stay

## 2024-05-21 ENCOUNTER — Inpatient Hospital Stay: Admitting: Oncology

## 2024-05-21 ENCOUNTER — Inpatient Hospital Stay: Attending: Hematology

## 2024-05-21 VITALS — BP 131/66 | HR 68 | Temp 98.8°F | Resp 18 | Ht 60.0 in | Wt 128.0 lb

## 2024-05-21 DIAGNOSIS — Z79899 Other long term (current) drug therapy: Secondary | ICD-10-CM | POA: Diagnosis not present

## 2024-05-21 DIAGNOSIS — D3A00 Benign carcinoid tumor of unspecified site: Secondary | ICD-10-CM | POA: Diagnosis not present

## 2024-05-21 DIAGNOSIS — E538 Deficiency of other specified B group vitamins: Secondary | ICD-10-CM

## 2024-05-21 DIAGNOSIS — D649 Anemia, unspecified: Secondary | ICD-10-CM

## 2024-05-21 DIAGNOSIS — D500 Iron deficiency anemia secondary to blood loss (chronic): Secondary | ICD-10-CM | POA: Diagnosis not present

## 2024-05-21 DIAGNOSIS — N1832 Chronic kidney disease, stage 3b: Secondary | ICD-10-CM | POA: Diagnosis not present

## 2024-05-21 DIAGNOSIS — C7A010 Malignant carcinoid tumor of the duodenum: Secondary | ICD-10-CM | POA: Insufficient documentation

## 2024-05-21 DIAGNOSIS — D3A010 Benign carcinoid tumor of the duodenum: Secondary | ICD-10-CM

## 2024-05-21 DIAGNOSIS — R5383 Other fatigue: Secondary | ICD-10-CM | POA: Diagnosis not present

## 2024-05-21 DIAGNOSIS — D472 Monoclonal gammopathy: Secondary | ICD-10-CM | POA: Insufficient documentation

## 2024-05-21 DIAGNOSIS — D631 Anemia in chronic kidney disease: Secondary | ICD-10-CM | POA: Insufficient documentation

## 2024-05-21 DIAGNOSIS — K922 Gastrointestinal hemorrhage, unspecified: Secondary | ICD-10-CM | POA: Insufficient documentation

## 2024-05-21 DIAGNOSIS — R079 Chest pain, unspecified: Secondary | ICD-10-CM | POA: Diagnosis not present

## 2024-05-21 DIAGNOSIS — I4891 Unspecified atrial fibrillation: Secondary | ICD-10-CM | POA: Diagnosis not present

## 2024-05-21 DIAGNOSIS — R200 Anesthesia of skin: Secondary | ICD-10-CM | POA: Diagnosis not present

## 2024-05-21 LAB — COMPREHENSIVE METABOLIC PANEL WITH GFR
ALT: 19 U/L (ref 0–44)
AST: 30 U/L (ref 15–41)
Albumin: 4.2 g/dL (ref 3.5–5.0)
Alkaline Phosphatase: 121 U/L (ref 38–126)
Anion gap: 11 (ref 5–15)
BUN: 29 mg/dL — ABNORMAL HIGH (ref 8–23)
CO2: 23 mmol/L (ref 22–32)
Calcium: 10.1 mg/dL (ref 8.9–10.3)
Chloride: 103 mmol/L (ref 98–111)
Creatinine, Ser: 1.6 mg/dL — ABNORMAL HIGH (ref 0.44–1.00)
GFR, Estimated: 32 mL/min — ABNORMAL LOW (ref >60)
Glucose, Bld: 99 mg/dL (ref 70–99)
Potassium: 4.2 mmol/L (ref 3.5–5.1)
Sodium: 137 mmol/L (ref 135–145)
Total Bilirubin: 0.4 mg/dL (ref 0.0–1.2)
Total Protein: 7.9 g/dL (ref 6.5–8.1)

## 2024-05-21 LAB — CBC WITH DIFFERENTIAL/PLATELET
Abs Immature Granulocytes: 0.01 K/uL (ref 0.00–0.07)
Basophils Absolute: 0 K/uL (ref 0.0–0.1)
Basophils Relative: 1 %
Eosinophils Absolute: 0.6 K/uL — ABNORMAL HIGH (ref 0.0–0.5)
Eosinophils Relative: 10 %
HCT: 33.5 % — ABNORMAL LOW (ref 36.0–46.0)
Hemoglobin: 10.7 g/dL — ABNORMAL LOW (ref 12.0–15.0)
Immature Granulocytes: 0 %
Lymphocytes Relative: 15 %
Lymphs Abs: 0.9 K/uL (ref 0.7–4.0)
MCH: 29.2 pg (ref 26.0–34.0)
MCHC: 31.9 g/dL (ref 30.0–36.0)
MCV: 91.5 fL (ref 80.0–100.0)
Monocytes Absolute: 0.5 K/uL (ref 0.1–1.0)
Monocytes Relative: 9 %
Neutro Abs: 3.8 K/uL (ref 1.7–7.7)
Neutrophils Relative %: 65 %
Platelets: 197 K/uL (ref 150–400)
RBC: 3.66 MIL/uL — ABNORMAL LOW (ref 3.87–5.11)
RDW: 14 % (ref 11.5–15.5)
WBC: 5.8 K/uL (ref 4.0–10.5)
nRBC: 0 % (ref 0.0–0.2)

## 2024-05-21 LAB — VITAMIN B12: Vitamin B-12: 1150 pg/mL — ABNORMAL HIGH (ref 180–914)

## 2024-05-21 LAB — IRON AND TIBC
Iron: 61 ug/dL (ref 28–170)
Saturation Ratios: 22 % (ref 10.4–31.8)
TIBC: 279 ug/dL (ref 250–450)
UIBC: 218 ug/dL

## 2024-05-21 LAB — SAMPLE TO BLOOD BANK

## 2024-05-21 LAB — FOLATE: Folate: 10.5 ng/mL (ref >5.9)

## 2024-05-21 LAB — FERRITIN: Ferritin: 141 ng/mL (ref 11–307)

## 2024-05-21 MED ORDER — CYANOCOBALAMIN 1000 MCG/ML IJ SOLN
1000.0000 ug | Freq: Once | INTRAMUSCULAR | Status: AC
  Administered 2024-05-21: 1000 ug via INTRAMUSCULAR
  Filled 2024-05-21: qty 1

## 2024-05-21 MED ORDER — EPOETIN ALFA-EPBX 20000 UNIT/ML IJ SOLN
20000.0000 [IU] | Freq: Once | INTRAMUSCULAR | Status: AC
  Administered 2024-05-21: 20000 [IU] via SUBCUTANEOUS
  Filled 2024-05-21: qty 1

## 2024-05-21 NOTE — Assessment & Plan Note (Addendum)
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 -Previous workup showed elevated kappa lambda light chain with a normal ratio.  Immunofixation shows IgG kappa monoclonal protein. -No crab criteria: Calcium  10.1, creatinine 1.60, hemoglobin 10.7 and no new bone pain. -Most recent MGUS labs are from today which are pending. -Recheck MGUS labs every 6 months.  Redraw MGUS labs in May/June 2026.

## 2024-05-21 NOTE — Progress Notes (Signed)
 [facility] OFFICE PROGRESS NOTE  [name] Rollene BRAVO, MD  ASSESSMENT & PLAN:  Assessment & Plan Iron  deficiency anemia due to chronic blood loss - Multifactorial anemia from CKD, iron  deficiency and chronic blood loss.  -No evidence of bleeding per patient.  -She received IV Feraheme  last on 12/04/23 and 12/11/23.  -Iron  labs from today show improvement of her hemoglobin.  Iron  levels show iron  saturation 22%, ferritin 141 and normal hemoglobin. -Continue oral iron  325 mg every other day.  Take with MiraLAX as needed for constipation. -Return to clinic in 4 months with labs and CMP.  Anemia due to stage 3b chronic kidney disease (HCC) -She is currently receiving 20,000 units Retacrit  monthly. -Proceed with 20,000 units Retacrit  today for hemoglobin of 10.7. -Retacrit  appears to be holding her hemoglobin between 9 and 10. -Continue monthly 20,000 units Retacrit .  Hold for hemoglobin 11 or greater. -Return to clinic monthly for labs plus or minus Retacrit  and B12 shots.  Return to clinic in approximately 12 weeks to see provider. Carcinoid tumor of duodenum, unspecified whether malignant (HCC) - Dotatate PET scan from 12/06/2022: No evidence of neuroendocrine tumor or metastasis. 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  No evidence of metastatic neuroendocrine tumor.  Dr. Katragadda recommended this last dotatate PET scan and to only repeat as clinically indicated. - Most recent chromogranin A is pending.  Will call with results. -Recheck these every 6 months.  No imaging needed.  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 -Previous workup showed elevated kappa lambda light chain with a normal ratio.  Immunofixation shows IgG kappa monoclonal protein. -No crab criteria: Calcium  10.1, creatinine 1.60, hemoglobin  10.7 and no new bone pain. -Most recent MGUS labs are from today which are pending. -Recheck MGUS labs every 6 months.  Redraw MGUS labs in May/June 2026.   Vitamin B12 deficiency disease -B12 level has improved.  Most recent level is greater than 1000. -Continue B12 injections monthly.  Proceed with B12 today. - Has been receiving B12 injections since December 2024.   Orders Placed This Encounter  Procedures   Ferritin    Standing Status:   Future    Expected Date:   08/19/2024    Expiration Date:   05/21/2025   Iron  and TIBC ([facility] ONLY)    Standing Status:   Future    Expected Date:   08/19/2024    Expiration Date:   05/21/2025   Vitamin B12    Standing Status:   Future    Expected Date:   08/19/2024    Expiration Date:   05/21/2025   Chromogranin A    Standing Status:   Future    Expected Date:   08/19/2024    Expiration Date:   05/21/2025   CBC with Differential    Standing Status:   Future    Expected Date:   08/19/2024    Expiration Date:   05/21/2025   Comprehensive metabolic panel    Standing Status:   Future    Expected Date:   08/19/2024    Expiration Date:   05/21/2025   Methylmalonic acid, serum    Standing Status:   Future    Expected Date:   08/19/2024    Expiration Date:   05/21/2025    INTERVAL HISTORY: Patient returns for follow-up.  Patient reports  overall doing well.  No change from previous appetite and energy levels are 100%.  She has numbness and tingling in her fingers and chest pain at times when she is in atrial fibrillation.  Otherwise she is doing well.  We reviewed CBC, CMP, iron  panel, B12 and folate levels.  SUMMARY OF HEMATOLOGIC HISTORY: Oncology History   No history exists.   1.  Duodenal neuroendocrine tumor: - 04/27/2021 (duodenal mass biopsy): Well-differentiated carcinoid tumor, grade 1, Ki-67 1%. - Dotatate PET (05/2021): No focal activity within the duodenum or evidence of metastatic disease. - EGD/EUS  (08/2021): Submucosal nodule found in the duodenum-consistent with previously biopsied duodenal NET.  An intramural subepithelial lesion found in the second part of the duodenum.  Lesion appears to originate from within the deep mucosa and submucosa.  She was not felt to be candidate for EMR. - Patient had traumatic experience after the EUS and did not want to pursue further options and declined a referral to [facility]. - Denies any diarrhea/flushing/wheezing/abdominal pain.  No obstructive symptoms.   2.  Normocytic anemia: - More than 5-year history of iron  deficiency anemia secondary to GI bleed.  She underwent multiple EGDs and colonoscopies and 2 small bowel capsule endoscopies. - She reports taking iron  tablets daily for the past 3 years.  Denies any ice pica. - Denies any BRBPR/melena.  She had blood transfusion 01/09/2023. -She also receives intermittent IV iron .  Last IV Feraheme  was given on 03/01/2023.   CBC    Component Value Date/Time   WBC 5.8 05/21/2024 1224   RBC 3.66 (L) 05/21/2024 1224   HGB 10.7 (L) 05/21/2024 1224   HGB 8.2 (L) 01/15/2023 1613   HCT 33.5 (L) 05/21/2024 1224   HCT 25.5 (L) 01/15/2023 1613   PLT 197 05/21/2024 1224   PLT 196 01/15/2023 1613   MCV 91.5 05/21/2024 1224   MCV 99 (H) 01/15/2023 1613   MCH 29.2 05/21/2024 1224   MCHC 31.9 05/21/2024 1224   RDW 14.0 05/21/2024 1224   RDW 13.2 01/15/2023 1613   LYMPHSABS 0.9 05/21/2024 1224   MONOABS 0.5 05/21/2024 1224   EOSABS 0.6 (H) 05/21/2024 1224   BASOSABS 0.0 05/21/2024 1224       Latest Ref Rng & Units 05/21/2024   12:24 PM 02/21/2024   11:25 AM 02/21/2024    8:48 AM  CMP  Glucose 70 - 99 mg/dL 99  88  92   BUN 8 - 23 mg/dL 29  25  26    Creatinine 0.44 - 1.00 mg/dL 8.39  8.60  8.60   Sodium 135 - 145 mmol/L 137  137  138   Potassium 3.5 - 5.1 mmol/L 4.2  4.0  4.0   Chloride 98 - 111 mmol/L 103  110  108   CO2 22 - 32 mmol/L 23  20  19    Calcium  8.9 - 10.3 mg/dL 89.8  9.9  89.9    Total Protein 6.5 - 8.1 g/dL 7.9   7.6   Total Bilirubin 0.0 - 1.2 mg/dL 0.4   0.5   Alkaline Phos 38 - 126 U/L 121   94   AST 15 - 41 U/L 30   23   ALT 0 - 44 U/L 19   14      Lab Results  Component Value Date   FERRITIN 141 05/21/2024   VITAMINB12 1,150 (H) 05/21/2024    Vitals:   05/21/24 1330  BP: 131/66  Pulse: 68  Resp: 18  Temp: 98.8 F (37.1 C)  SpO2: 97%    Review of System:  Review of Systems  Constitutional:  Positive for malaise/fatigue.  Neurological:  Positive for tingling. Negative for dizziness.    Physical Exam: Physical Exam Constitutional:      Appearance: Normal appearance.  HENT:     Head: Normocephalic and atraumatic.  Eyes:     Pupils: Pupils are equal, round, and reactive to light.  Cardiovascular:     Rate and Rhythm: Normal rate and regular rhythm.     Heart sounds: Normal heart sounds. No murmur heard. Pulmonary:     Effort: Pulmonary effort is normal.     Breath sounds: Normal breath sounds. No wheezing.  Abdominal:     General: Bowel sounds are normal. There is no distension.     Palpations: Abdomen is soft.     Tenderness: There is no abdominal tenderness.  Musculoskeletal:        General: Normal range of motion.     Cervical back: Normal range of motion.  Skin:    General: Skin is warm and dry.     Findings: No rash.  Neurological:     Mental Status: She is alert and oriented to person, place, and time.     Gait: Gait is intact.  Psychiatric:        Mood and Affect: Mood and affect normal.        Cognition and Memory: Memory normal.        Judgment: Judgment normal.      I spent 25 minutes dedicated to the care of this patient (face-to-face and non-face-to-face) on the date of the encounter to include what is described in the assessment and plan.,  Delon Hope, NP 05/21/2024 2:27 PM

## 2024-05-21 NOTE — Assessment & Plan Note (Addendum)
-   Dotatate PET scan from 12/06/2022: No evidence of neuroendocrine tumor or metastasis. 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  No evidence of metastatic neuroendocrine tumor.  Dr. Katragadda recommended this last dotatate PET scan and to only repeat as clinically indicated. - Most recent chromogranin A is pending.  Will call with results. -Recheck these every 6 months.  No imaging needed.

## 2024-05-21 NOTE — Progress Notes (Signed)
 Patient's Hgb 10.7 and blood pressure stable. Patient tolerated Retacrit  and B12 injection with no complaints voiced.  Site clean and dry with no bruising or swelling noted at site.  See MAR for details.  Band aid applied.  Patient stable during and after injection.  Vss with discharge and left in satisfactory condition with no s/s of distress noted. All follow ups as scheduled.   [name] [name]

## 2024-05-21 NOTE — Assessment & Plan Note (Addendum)
-  She is currently receiving 20,000 units Retacrit  monthly. -Proceed with 20,000 units Retacrit  today for hemoglobin of 10.7. -Retacrit  appears to be holding her hemoglobin between 9 and 10. -Continue monthly 20,000 units Retacrit .  Hold for hemoglobin 11 or greater. -Return to clinic monthly for labs plus or minus Retacrit  and B12 shots.  Return to clinic in approximately 12 weeks to see provider.

## 2024-05-21 NOTE — Assessment & Plan Note (Addendum)
-  B12 level has improved.  Most recent level is greater than 1000. -Continue B12 injections monthly.  Proceed with B12 today. - Has been receiving B12 injections since December 2024.

## 2024-05-21 NOTE — Assessment & Plan Note (Addendum)
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  -No evidence of bleeding per patient.  -She received IV Feraheme  last on 12/04/23 and 12/11/23.  -Iron  labs from today show improvement of her hemoglobin.  Iron  levels show iron  saturation 22%, ferritin 141 and normal hemoglobin. -Continue oral iron  325 mg every other day.  Take with MiraLAX as needed for constipation. -Return to clinic in 4 months with labs and CMP.

## 2024-05-22 LAB — KAPPA/LAMBDA LIGHT CHAINS
Kappa free light chain: 60.4 mg/L — ABNORMAL HIGH (ref 3.3–19.4)
Kappa, lambda light chain ratio: 1.32 (ref 0.26–1.65)
Lambda free light chains: 45.6 mg/L — ABNORMAL HIGH (ref 5.7–26.3)

## 2024-05-22 LAB — CHROMOGRANIN A: Chromogranin A (ng/mL): 725.1 ng/mL — ABNORMAL HIGH (ref 0.0–101.8)

## 2024-05-23 LAB — PROTEIN ELECTROPHORESIS, SERUM
A/G Ratio: 0.8 (ref 0.7–1.7)
Albumin ELP: 3.3 g/dL (ref 2.9–4.4)
Alpha-1-Globulin: 0.3 g/dL (ref 0.0–0.4)
Alpha-2-Globulin: 0.9 g/dL (ref 0.4–1.0)
Beta Globulin: 0.9 g/dL (ref 0.7–1.3)
Gamma Globulin: 1.7 g/dL (ref 0.4–1.8)
Globulin, Total: 3.9 g/dL (ref 2.2–3.9)
M-Spike, %: 0.5 g/dL — ABNORMAL HIGH
Total Protein ELP: 7.2 g/dL (ref 6.0–8.5)

## 2024-05-23 LAB — IMMUNOFIXATION ELECTROPHORESIS
IgA: 185 mg/dL (ref 64–422)
IgG (Immunoglobin G), Serum: 1736 mg/dL — ABNORMAL HIGH (ref 586–1602)
IgM (Immunoglobulin M), Srm: 138 mg/dL (ref 26–217)
Total Protein ELP: 7.3 g/dL (ref 6.0–8.5)

## 2024-05-26 LAB — METHYLMALONIC ACID, SERUM: Methylmalonic Acid, Quantitative: 217 nmol/L (ref 0–378)

## 2024-06-09 ENCOUNTER — Other Ambulatory Visit: Payer: Self-pay | Admitting: Family Medicine

## 2024-06-17 ENCOUNTER — Telehealth: Payer: Self-pay

## 2024-06-17 NOTE — Patient Outreach (Signed)
 Complex Care Management Care Guide Note  06/17/2024 Name: [name] [name] MRN: [id number] DOB: [birth year]  [name] [name] is a 83 y.o. year old female who is a primary care patient of Antonetta Rollene BRAVO, MD and is actively engaged with the care management team. I reached out to Verlin SHAUNNA Dixons by phone today to assist with re-scheduling  with the RN Case Manager.  Follow up plan: No Telephone appointment with complex care management team member rescheduled. Patient informed me no longer need services.  Shereen Gin [facility]  Population Health VBCI Assistant Direct Dial: [phone]  Fax: [phone] Website: [email or web address]

## 2024-06-18 ENCOUNTER — Inpatient Hospital Stay: Attending: Hematology

## 2024-06-18 ENCOUNTER — Inpatient Hospital Stay

## 2024-06-18 VITALS — BP 102/69 | HR 90 | Temp 96.7°F | Resp 18

## 2024-06-18 DIAGNOSIS — D649 Anemia, unspecified: Secondary | ICD-10-CM

## 2024-06-18 DIAGNOSIS — Z79899 Other long term (current) drug therapy: Secondary | ICD-10-CM | POA: Insufficient documentation

## 2024-06-18 DIAGNOSIS — C7A010 Malignant carcinoid tumor of the duodenum: Secondary | ICD-10-CM | POA: Insufficient documentation

## 2024-06-18 DIAGNOSIS — D500 Iron deficiency anemia secondary to blood loss (chronic): Secondary | ICD-10-CM

## 2024-06-18 DIAGNOSIS — N1832 Chronic kidney disease, stage 3b: Secondary | ICD-10-CM

## 2024-06-18 LAB — CBC WITH DIFFERENTIAL/PLATELET
Abs Immature Granulocytes: 0.01 K/uL (ref 0.00–0.07)
Basophils Absolute: 0 K/uL (ref 0.0–0.1)
Basophils Relative: 1 %
Eosinophils Absolute: 0.3 K/uL (ref 0.0–0.5)
Eosinophils Relative: 8 %
HCT: 27.8 % — ABNORMAL LOW (ref 36.0–46.0)
Hemoglobin: 8.6 g/dL — ABNORMAL LOW (ref 12.0–15.0)
Immature Granulocytes: 0 %
Lymphocytes Relative: 22 %
Lymphs Abs: 0.9 K/uL (ref 0.7–4.0)
MCH: 28.8 pg (ref 26.0–34.0)
MCHC: 30.9 g/dL (ref 30.0–36.0)
MCV: 93 fL (ref 80.0–100.0)
Monocytes Absolute: 0.5 K/uL (ref 0.1–1.0)
Monocytes Relative: 11 %
Neutro Abs: 2.4 K/uL (ref 1.7–7.7)
Neutrophils Relative %: 58 %
Platelets: 184 K/uL (ref 150–400)
RBC: 2.99 MIL/uL — ABNORMAL LOW (ref 3.87–5.11)
RDW: 15.5 % (ref 11.5–15.5)
WBC: 4.1 K/uL (ref 4.0–10.5)
nRBC: 0 % (ref 0.0–0.2)

## 2024-06-18 LAB — SAMPLE TO BLOOD BANK

## 2024-06-18 MED ORDER — CYANOCOBALAMIN 1000 MCG/ML IJ SOLN
1000.0000 ug | Freq: Once | INTRAMUSCULAR | Status: AC
  Administered 2024-06-18: 11:00:00 1000 ug via INTRAMUSCULAR
  Filled 2024-06-18: qty 1

## 2024-06-18 MED ORDER — EPOETIN ALFA-EPBX 20000 UNIT/ML IJ SOLN
20000.0000 [IU] | Freq: Once | INTRAMUSCULAR | Status: AC
  Administered 2024-06-18: 11:00:00 20000 [IU] via SUBCUTANEOUS
  Filled 2024-06-18: qty 1

## 2024-06-18 NOTE — Progress Notes (Signed)
 Patient presents today for Retacrit  and B12 injection. Hemoglobin reviewed prior to administration. Patient notes she has not noticed any bleeding lately, patient advised to call Cancer center if patient is symptomatic of low hemoglobin before her next appointment. Patient verbalizes understanding.  VSS tolerated without incident or complaint. See MAR for details. Patient stable during and after injection. Patient discharged in satisfactory condition with no s/s of distress noted.

## 2024-06-18 NOTE — Patient Instructions (Signed)
 [facility] [place] - A DEPT OF [facility]  Discharge Instructions: Thank you for choosing [facility] to provide your oncology and hematology care.  If you have a lab appointment with the Cancer Center - please note that after April 8th, 2024, all labs will be drawn in the cancer center.  You do not have to check in or register with the main entrance as you have in the past but will complete your check-in in the cancer center.  Wear comfortable clothing and clothing appropriate for easy access to any Portacath or PICC line.   We strive to give you quality time with your provider. You may need to reschedule your appointment if you arrive late (15 or more minutes).  Arriving late affects you and other patients whose appointments are after yours.  Also, if you miss three or more appointments without notifying the office, you may be dismissed from the clinic at the provider's discretion.      For prescription refill requests, have your pharmacy contact our office and allow 72 hours for refills to be completed.    Today you received the following Retacrit and B12 injection, return as scheduled.   To help prevent nausea and vomiting after your treatment, we encourage you to take your nausea medication as directed.  BELOW ARE SYMPTOMS THAT SHOULD BE REPORTED IMMEDIATELY: *FEVER GREATER THAN 100.4 F (38 C) OR HIGHER *CHILLS OR SWEATING *NAUSEA AND VOMITING THAT IS NOT CONTROLLED WITH YOUR NAUSEA MEDICATION *UNUSUAL SHORTNESS OF BREATH *UNUSUAL BRUISING OR BLEEDING *URINARY PROBLEMS (pain or burning when urinating, or frequent urination) *BOWEL PROBLEMS (unusual diarrhea, constipation, pain near the anus) TENDERNESS IN MOUTH AND THROAT WITH OR WITHOUT PRESENCE OF ULCERS (sore throat, sores in mouth, or a toothache) UNUSUAL RASH, SWELLING OR PAIN  UNUSUAL VAGINAL DISCHARGE OR ITCHING   Items with * indicate a potential emergency and should be followed up as  soon as possible or go to the Emergency Department if any problems should occur.  Please show the CHEMOTHERAPY ALERT CARD or IMMUNOTHERAPY ALERT CARD at check-in to the Emergency Department and triage nurse.  Should you have questions after your visit or need to cancel or reschedule your appointment, please contact [facility] [place] - A DEPT OF [facility] [phone]  and follow the prompts.  Office hours are 8:00 a.m. to 4:30 p.m. Monday - Friday. Please note that voicemails left after 4:00 p.m. may not be returned until the following business day.  We are closed weekends and major holidays. You have access to a nurse at all times for urgent questions. Please call the main number to the clinic [phone] and follow the prompts.  For any non-urgent questions, you may also contact your provider using MyChart. We now offer e-Visits for anyone 63 and older to request care online for non-urgent symptoms. For details visit [email or web address].   Also download the MyChart app! Go to the app store, search "MyChart", open the app, select , and log in with your MyChart username and password.

## 2024-06-29 ENCOUNTER — Ambulatory Visit: Admitting: Cardiology

## 2024-07-16 ENCOUNTER — Inpatient Hospital Stay

## 2024-07-17 ENCOUNTER — Other Ambulatory Visit: Payer: Self-pay | Admitting: *Deleted

## 2024-07-17 DIAGNOSIS — I6523 Occlusion and stenosis of bilateral carotid arteries: Secondary | ICD-10-CM

## 2024-07-22 ENCOUNTER — Ambulatory Visit: Admitting: Family Medicine

## 2024-07-23 ENCOUNTER — Inpatient Hospital Stay

## 2024-07-23 ENCOUNTER — Inpatient Hospital Stay: Attending: Hematology

## 2024-07-23 VITALS — BP 137/65 | HR 64 | Temp 96.5°F | Resp 18

## 2024-07-23 DIAGNOSIS — D500 Iron deficiency anemia secondary to blood loss (chronic): Secondary | ICD-10-CM

## 2024-07-23 DIAGNOSIS — N1832 Chronic kidney disease, stage 3b: Secondary | ICD-10-CM | POA: Insufficient documentation

## 2024-07-23 DIAGNOSIS — D631 Anemia in chronic kidney disease: Secondary | ICD-10-CM | POA: Insufficient documentation

## 2024-07-23 DIAGNOSIS — Z79899 Other long term (current) drug therapy: Secondary | ICD-10-CM | POA: Insufficient documentation

## 2024-07-23 DIAGNOSIS — C7A010 Malignant carcinoid tumor of the duodenum: Secondary | ICD-10-CM | POA: Insufficient documentation

## 2024-07-23 DIAGNOSIS — E559 Vitamin D deficiency, unspecified: Secondary | ICD-10-CM | POA: Insufficient documentation

## 2024-07-23 DIAGNOSIS — D649 Anemia, unspecified: Secondary | ICD-10-CM

## 2024-07-23 LAB — CBC WITH DIFFERENTIAL/PLATELET
Abs Immature Granulocytes: 0.01 K/uL (ref 0.00–0.07)
Basophils Absolute: 0 K/uL (ref 0.0–0.1)
Basophils Relative: 1 %
Eosinophils Absolute: 0.4 K/uL (ref 0.0–0.5)
Eosinophils Relative: 8 %
HCT: 32 % — ABNORMAL LOW (ref 36.0–46.0)
Hemoglobin: 9.9 g/dL — ABNORMAL LOW (ref 12.0–15.0)
Immature Granulocytes: 0 %
Lymphocytes Relative: 21 %
Lymphs Abs: 1 K/uL (ref 0.7–4.0)
MCH: 29 pg (ref 26.0–34.0)
MCHC: 30.9 g/dL (ref 30.0–36.0)
MCV: 93.8 fL (ref 80.0–100.0)
Monocytes Absolute: 0.5 K/uL (ref 0.1–1.0)
Monocytes Relative: 11 %
Neutro Abs: 2.8 K/uL (ref 1.7–7.7)
Neutrophils Relative %: 59 %
Platelets: 199 K/uL (ref 150–400)
RBC: 3.41 MIL/uL — ABNORMAL LOW (ref 3.87–5.11)
RDW: 14.8 % (ref 11.5–15.5)
WBC: 4.8 K/uL (ref 4.0–10.5)
nRBC: 0 % (ref 0.0–0.2)

## 2024-07-23 LAB — SAMPLE TO BLOOD BANK

## 2024-07-23 MED ORDER — EPOETIN ALFA-EPBX 20000 UNIT/ML IJ SOLN
20000.0000 [IU] | Freq: Once | INTRAMUSCULAR | Status: AC
  Administered 2024-07-23: 20000 [IU] via SUBCUTANEOUS
  Filled 2024-07-23: qty 1

## 2024-07-23 MED ORDER — CYANOCOBALAMIN 1000 MCG/ML IJ SOLN
1000.0000 ug | Freq: Once | INTRAMUSCULAR | Status: AC
  Administered 2024-07-23: 1000 ug via INTRAMUSCULAR
  Filled 2024-07-23: qty 1

## 2024-07-23 NOTE — Progress Notes (Signed)
 Patient's Hgb 9.9 and blood pressure stable. Patient tolerated  Retacrit  and B12 injection with no complaints voiced.  Site clean and dry with no bruising or swelling noted at site.  See MAR for details.  Band aid applied.  Patient stable during and after injection.  Vss with discharge and left in satisfactory condition with no s/s of distress noted. All follow ups as scheduled.   [name] [name]

## 2024-08-06 ENCOUNTER — Ambulatory Visit

## 2024-08-10 ENCOUNTER — Ambulatory Visit

## 2024-08-11 ENCOUNTER — Ambulatory Visit: Admitting: Cardiology

## 2024-08-13 ENCOUNTER — Inpatient Hospital Stay

## 2024-08-20 ENCOUNTER — Inpatient Hospital Stay: Attending: Hematology

## 2024-08-20 ENCOUNTER — Inpatient Hospital Stay

## 2024-09-10 ENCOUNTER — Inpatient Hospital Stay: Admitting: Oncology

## 2024-09-10 ENCOUNTER — Inpatient Hospital Stay

## 2024-09-16 ENCOUNTER — Inpatient Hospital Stay

## 2024-09-16 ENCOUNTER — Inpatient Hospital Stay: Admitting: Oncology

## 2024-09-16 ENCOUNTER — Inpatient Hospital Stay: Attending: Hematology

## 2024-09-24 ENCOUNTER — Ambulatory Visit: Admitting: Family Medicine

## 2024-12-28 ENCOUNTER — Ambulatory Visit
